# Patient Record
Sex: Female | Born: 1995
Health system: Southern US, Community
[De-identification: ages and names within clinical notes are randomized; demographics above are authoritative.]

## PROBLEM LIST (undated history)

## (undated) ENCOUNTER — Inpatient Hospital Stay (HOSPITAL_COMMUNITY): Payer: Self-pay

## (undated) DIAGNOSIS — J45909 Unspecified asthma, uncomplicated: Secondary | ICD-10-CM

## (undated) DIAGNOSIS — R87629 Unspecified abnormal cytological findings in specimens from vagina: Secondary | ICD-10-CM

## (undated) DIAGNOSIS — D649 Anemia, unspecified: Secondary | ICD-10-CM

## (undated) DIAGNOSIS — F32A Depression, unspecified: Secondary | ICD-10-CM

## (undated) DIAGNOSIS — N809 Endometriosis, unspecified: Secondary | ICD-10-CM

## (undated) DIAGNOSIS — F329 Major depressive disorder, single episode, unspecified: Secondary | ICD-10-CM

## (undated) DIAGNOSIS — Z9889 Other specified postprocedural states: Secondary | ICD-10-CM

## (undated) DIAGNOSIS — J189 Pneumonia, unspecified organism: Secondary | ICD-10-CM

## (undated) DIAGNOSIS — M797 Fibromyalgia: Secondary | ICD-10-CM

## (undated) DIAGNOSIS — E282 Polycystic ovarian syndrome: Secondary | ICD-10-CM

## (undated) DIAGNOSIS — M549 Dorsalgia, unspecified: Secondary | ICD-10-CM

## (undated) DIAGNOSIS — F419 Anxiety disorder, unspecified: Secondary | ICD-10-CM

## (undated) DIAGNOSIS — R51 Headache: Secondary | ICD-10-CM

## (undated) DIAGNOSIS — K219 Gastro-esophageal reflux disease without esophagitis: Secondary | ICD-10-CM

## (undated) DIAGNOSIS — M419 Scoliosis, unspecified: Secondary | ICD-10-CM

## (undated) DIAGNOSIS — F319 Bipolar disorder, unspecified: Secondary | ICD-10-CM

## (undated) DIAGNOSIS — S82899A Other fracture of unspecified lower leg, initial encounter for closed fracture: Secondary | ICD-10-CM

## (undated) DIAGNOSIS — N83209 Unspecified ovarian cyst, unspecified side: Secondary | ICD-10-CM

## (undated) DIAGNOSIS — K589 Irritable bowel syndrome without diarrhea: Secondary | ICD-10-CM

## (undated) DIAGNOSIS — R112 Nausea with vomiting, unspecified: Secondary | ICD-10-CM

## (undated) DIAGNOSIS — R519 Headache, unspecified: Secondary | ICD-10-CM

## (undated) DIAGNOSIS — E785 Hyperlipidemia, unspecified: Secondary | ICD-10-CM

## (undated) HISTORY — PX: ABDOMINAL HYSTERECTOMY: SHX81

## (undated) HISTORY — DX: Anxiety disorder, unspecified: F41.9

## (undated) HISTORY — DX: Depression, unspecified: F32.A

## (undated) HISTORY — PX: FRACTURE SURGERY: SHX138

## (undated) HISTORY — DX: Hyperlipidemia, unspecified: E78.5

## (undated) HISTORY — DX: Major depressive disorder, single episode, unspecified: F32.9

## (undated) HISTORY — DX: Unspecified ovarian cyst, unspecified side: N83.209

## (undated) HISTORY — DX: Fibromyalgia: M79.7

## (undated) HISTORY — DX: Dorsalgia, unspecified: M54.9

---
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
## 2013-08-21 ENCOUNTER — Inpatient Hospital Stay: Payer: Self-pay | Admitting: Psychiatry

## 2013-08-21 LAB — DRUG SCREEN, URINE
Benzodiazepine, Ur Scrn: NEGATIVE (ref ?–200)
Cannabinoid 50 Ng, Ur ~~LOC~~: NEGATIVE (ref ?–50)
Cocaine Metabolite,Ur ~~LOC~~: NEGATIVE (ref ?–300)
MDMA (Ecstasy)Ur Screen: NEGATIVE (ref ?–500)
Methadone, Ur Screen: NEGATIVE (ref ?–300)
Opiate, Ur Screen: NEGATIVE (ref ?–300)
Phencyclidine (PCP) Ur S: NEGATIVE (ref ?–25)
Tricyclic, Ur Screen: NEGATIVE (ref ?–1000)

## 2013-08-21 LAB — CBC
HCT: 37.1 % (ref 35.0–47.0)
HGB: 12.5 g/dL (ref 12.0–16.0)
MCHC: 33.6 g/dL (ref 32.0–36.0)
MCV: 78 fL — ABNORMAL LOW (ref 80–100)
Platelet: 409 10*3/uL (ref 150–440)
RBC: 4.74 10*6/uL (ref 3.80–5.20)
RDW: 14.4 % (ref 11.5–14.5)
WBC: 9.9 10*3/uL (ref 3.6–11.0)

## 2013-08-21 LAB — URINALYSIS, COMPLETE
Bacteria: NONE SEEN
Glucose,UR: NEGATIVE mg/dL (ref 0–75)
Ketone: NEGATIVE
Leukocyte Esterase: NEGATIVE
Nitrite: NEGATIVE
Squamous Epithelial: 2

## 2013-08-21 LAB — COMPREHENSIVE METABOLIC PANEL
Anion Gap: 3 — ABNORMAL LOW (ref 7–16)
Bilirubin,Total: 0.3 mg/dL (ref 0.2–1.0)
Calcium, Total: 8.7 mg/dL — ABNORMAL LOW (ref 9.0–10.7)
Co2: 28 mmol/L — ABNORMAL HIGH (ref 16–25)
Creatinine: 0.59 mg/dL — ABNORMAL LOW (ref 0.60–1.30)
Osmolality: 270 (ref 275–301)
Potassium: 3.6 mmol/L (ref 3.3–4.7)
SGOT(AST): 24 U/L (ref 0–26)
Sodium: 136 mmol/L (ref 132–141)
Total Protein: 7.7 g/dL (ref 6.4–8.6)

## 2013-08-21 LAB — TSH: Thyroid Stimulating Horm: 1.35 u[IU]/mL

## 2013-08-26 LAB — HEPATIC FUNCTION PANEL A (ARMC)
Alkaline Phosphatase: 102 U/L (ref 82–169)
Total Protein: 7.5 g/dL (ref 6.4–8.6)

## 2013-12-30 ENCOUNTER — Emergency Department: Payer: Self-pay | Admitting: Emergency Medicine

## 2014-01-23 ENCOUNTER — Ambulatory Visit: Payer: Self-pay | Admitting: Specialist

## 2014-04-04 ENCOUNTER — Emergency Department: Payer: Self-pay | Admitting: Emergency Medicine

## 2014-07-11 ENCOUNTER — Other Ambulatory Visit: Payer: Self-pay | Admitting: Family Medicine

## 2014-07-11 LAB — BASIC METABOLIC PANEL
Anion Gap: 7 (ref 7–16)
BUN: 10 mg/dL (ref 7–18)
Calcium, Total: 9.2 mg/dL (ref 9.0–10.7)
Chloride: 102 mmol/L (ref 98–107)
Co2: 29 mmol/L (ref 21–32)
Creatinine: 0.73 mg/dL (ref 0.60–1.30)
EGFR (African American): >60
EGFR (Non-African Amer.): >60
Glucose: 89 mg/dL (ref 65–99)
Osmolality: 274 (ref 275–301)
Potassium: 3.8 mmol/L (ref 3.5–5.1)
Sodium: 138 mmol/L (ref 136–145)

## 2014-12-29 ENCOUNTER — Emergency Department: Payer: Self-pay | Admitting: Emergency Medicine

## 2015-02-08 NOTE — Consult Note (Signed)
  Psychological Assessment  [name]18of Evaluation: 11-4-14Administered: [facility] Multiphasic Personality Inventory-2 (MMPI-2) for Referral: [name] was referred for a psychological assessment by her physician, Kristine LineaJolanta Pucilowska, MD. She was admitted to Behavioral Medicine for the treatment of increasing depression and thoughts to cut herself. She has a history of depression, anxiety and self-cutting. Please see the history and physical and psychosocial history for further background information. An assessment of personality structure was requested. [name]? MMPI-2 protocol is compared to that of other adult females she obtained the following profile: 27?63+[id number]:#. The MMPI-2 validity scales indicate that the clinical profile is valid. It also suggests that she is experiencing a long standing and serious disturbance. PresentationShe reports that she is experiencing a moderate level of emotional distress characterized by depression and anxiety. She feels unable to get going and may have a low level of anhedonia. She is more sensitive and feels more intensely than most people. She worries about many aspects of her life. Her feelings are easily hurt and she is inclined to take things hard. She often is sorry she is so irritable and grouchy.   She reports that her attention and concentration are all right. She lacks self-confidence and thinks she is useless at times. She has sometimes thought that difficulties were piling up so high that she could not overcome them. At periods her mind seems to work more slowly than usual. Her thought processes are as expected, with no unusual ideas or experiences.  She wishes that she could be as happy as others seem to be. Relations:  She reports that she is socially reserved. She likes making decisions and assigning jobs to others and believes, if given the chance, she would make a good leader of people. She finds it hard to talk when she meets new people.  She reports good relations with her family. Her family demonstrated love and companionship toward her. Problem Areas: She reports that she is in just as good physical health as most of her friends. Her sleep is not fitful and disturbed. She does not tire quickly and is not without energy a good deal of the time. She is very conventional and does not get into trouble over her behavior. Treatment: Her prognosis is fair to good because she is experiencing some distress which can motivate her to engage in therapy. Short-term, behavioral interventions focused on her reasons for entering treatment will be important initially. If she becomes ngaged in the treatment process, cognitive-behavioral therapy focusing on her depressive cognitions will be beneficial. Diagnostic Impression:Depressive DisorderAnxiety Disorder with history of panic attacksBipolar Disorder    Electronic Signatures: Carola Frostoush, Lee Ann (PsyD, HSP-P)  (Signed on 04-Nov-14 13:44)  Authored  Last Updated: 04-Nov-14 13:44 by Carola Frostoush, Lee Ann (PsyD, HSP-P)

## 2015-02-08 NOTE — H&P (Signed)
PATIENT NAME:  Janet Rich, Janet Rich MR#:  [id number] DATE OF BIRTH:  01-Sep-1996  DATE OF ADMISSION:  08/21/2013  REFERRING PHYSICIAN: Emergency Room MD.   ATTENDING PHYSICIAN: Kristine Linea, MD.   IDENTIFYING DATA: Janet Rich is an 19 year old female with history of depression and anxiety.   CHIEF COMPLAINT: "I wanted to cut myself."   HISTORY OF PRESENT ILLNESS: Janet Rich has a long history of depression. She started when she was still in elementary school, and between the age of 40 and 27, she was in therapy which she found rather helpful. For past year or about, she was prescribed Celexa but stopped taking it about 2 months ago. She felt that the Celexa took away any happiness from her life. She did not feel as depressed, but she never felt happy or at ease. She reports a long history of chronic suicidal thoughts. She used to cut but stopped doing it several years ago. She has not been cutting ever since and today was the first time when she was really tempted. Her mother noticed and brought her to the hospital. She attempted an overdose once a month or so ago when she overdosed on Celexa. She did not come to the hospital. Reportedly, the mother knew about it. She reports poor sleep, increased appetite with 20-pound weight gain, anhedonia, feelings of guilt, hopelessness, worthlessness, poor energy and concentration, social isolation, crying spells that have now culminated in suicidal thoughts with a plan. The patient does report symptoms suggestive of bipolar mania, but they are very short lasting, hours or a day at the most. She denies psychotic symptoms. She does feel anxious and has panic attacks at least 2 to 3 times a week. She reports that they were not helped with Celexa. She has some symptoms suggestive of obsessive-compulsive disorder with organizing. She feels that she spends too much time on that. No checking behavior or handwashing. She does worry in her head and has to calm her head down  in order to fall to sleep. She denies alcohol, illicit drugs or prescription pill abuse.   PAST PSYCHIATRIC HISTORY: As above, she was in therapy. She still has a therapist at [facility] but no longer she can afford it. There are transportation problems and money problems.   FAMILY PSYCHIATRIC HISTORY: Grandma with depression. Grandfather with alcoholism. Mother with bipolar. Father with schizophrenia. There were no completed suicides in the family.   PAST MEDICAL HISTORY: Asthma.   ALLERGIES: No known drug allergies.   MEDICATIONS ON ADMISSION: Albuterol inhaler.   SOCIAL HISTORY: She is a Holiday representative in high school. Does well in school. She wants to study education and has college plans but recent fight with depression made her change her mind, and she wants to do some kind of zoology. Has not decided yet. She lives with her mother. Her parents are divorced. She has sporadic contact with her father. She has friends and does well in school.   REVIEW OF SYSTEMS:   CONSTITUTIONAL: No fevers or chills. Positive for 20-pound weight gain.  EYES: No double or blurred vision.  ENT: No hearing loss.  RESPIRATORY: Positive for occasional shortness of breath.  CARDIOVASCULAR: No chest pain or orthopnea.  GASTROINTESTINAL: No abdominal pain, nausea, vomiting or diarrhea.  GENITOURINARY: No incontinence or frequency.  ENDOCRINE: No heat or cold intolerance.  LYMPHATIC: No anemia or easy bruising.  INTEGUMENTARY: No acne or rash.  MUSCULOSKELETAL: No muscle or joint pain.  NEUROLOGIC: No tingling or weakness.  PSYCHIATRIC: See history of present  illness for details.   PHYSICAL EXAMINATION:  VITAL SIGNS: Blood pressure 135/76, pulse 93, respirations 18, temperature 98.2.  GENERAL: This is an obese young female in no acute distress.  HEENT: The pupils are equal, round and reactive to light. Sclerae anicteric.  NECK: Supple. No thyromegaly.  LUNGS: Clear to auscultation. No dullness to percussion.   HEART: Regular rhythm and rate. No murmurs, rubs or gallops.  ABDOMEN: Soft, nontender, nondistended. Positive bowel sounds.  MUSCULOSKELETAL: Normal muscle strength in all extremities.  SKIN: No rashes or bruises.  LYMPHATIC: No cervical adenopathy.  NEUROLOGIC: Cranial nerves II through XII are intact.   LABORATORY DATA: Chemistries are within normal limits. Blood alcohol level is zero. LFTs within normal limits. TSH 1.35. Urine tox screen negative for substances. CBC within normal limits. Urinalysis is not suggestive of urinary tract infection.   MENTAL STATUS EXAMINATION ON ADMISSION: The patient is alert and oriented to person, place, time and situation. She is pleasant, polite and cooperative. She is well groomed and casually dressed. She maintains good eye contact. Her speech is of normal rhythm, rate and volume. Mood is depressed with flat affect. Thought process is logical and goal oriented. Thought content: She denies suicidal or homicidal ideation at the moment but was brought to the hospital by her mother after she attempted to cut. She denies thoughts of hurting others. She denies delusions, paranoia, auditory or visual hallucinations. Her cognition is grossly intact. She registers 3 out of 3 and recalls 3 out of 3 objects after 5 minutes. She can spell world forward and backward. She knows the current Economistresident. Her insight and judgment are questionable.   SUICIDE RISK ASSESSMENT: This is a patient with history of depression, anxiety, mood instability and self-injurious behavior in the past who came to the hospital for worsening depression and suicidal ideation with a plan in the context of discontinuation of her antidepressant. She is at increased risk of suicide.   DIAGNOSES:  AXIS I: Bipolar affective disorder, depressed.  AXIS II: Deferred.  AXIS III: Asthma.  AXIS IV: Mental illness, treatment compliance, access to care, financial.  AXIS V: Global assessment of functioning 25.    PLAN: The patient [facility] Unit for safety, stabilization and medication management. She was initially placed on suicide precautions and was closely monitored for any unsafe behaviors. She underwent full psychiatric and risk assessment. She received pharmacotherapy, individual and group psychotherapy, substance abuse counseling and support from therapeutic milieu.  1. Suicidal ideation: The patient is able to contract for safety.  2. Mood: We will start Lamictal and add Seroquel at night for sleep and further mood stabilization. Will try to titrate the dose as quickly as feasible.  3. Medical: She is on albuterol.  4. Disposition: She will be discharged to home to her mother.    ____________________________ Ellin GoodieJolanta B. Jennet MaduroPucilowska, MD jbp:gb D: 08/21/2013 20:55:39 ET T: 08/21/2013 21:25:11 ET JOB#: [id number]  cc: Dymond Spreen B. Jennet MaduroPucilowska, MD, <Dictator> Shari ProwsJOLANTA B Hena Ewalt MD ELECTRONICALLY SIGNED 08/22/2013 23:07

## 2015-03-01 ENCOUNTER — Emergency Department: Payer: Medicaid Other

## 2015-03-01 ENCOUNTER — Encounter: Payer: Self-pay | Admitting: Emergency Medicine

## 2015-03-01 ENCOUNTER — Emergency Department
Admission: EM | Admit: 2015-03-01 | Discharge: 2015-03-01 | Disposition: A | Discharge status: Home / Self Care | Payer: Medicaid Other | Attending: Emergency Medicine | Admitting: Emergency Medicine

## 2015-03-01 DIAGNOSIS — Y92009 Unspecified place in unspecified non-institutional (private) residence as the place of occurrence of the external cause: Secondary | ICD-10-CM | POA: Insufficient documentation

## 2015-03-01 DIAGNOSIS — S82891A Other fracture of right lower leg, initial encounter for closed fracture: Secondary | ICD-10-CM | POA: Diagnosis not present

## 2015-03-01 DIAGNOSIS — T148 Other injury of unspecified body region: Secondary | ICD-10-CM | POA: Diagnosis not present

## 2015-03-01 DIAGNOSIS — Y9301 Activity, walking, marching and hiking: Secondary | ICD-10-CM | POA: Insufficient documentation

## 2015-03-01 DIAGNOSIS — Y9389 Activity, other specified: Secondary | ICD-10-CM | POA: Insufficient documentation

## 2015-03-01 DIAGNOSIS — T148XXA Other injury of unspecified body region, initial encounter: Secondary | ICD-10-CM

## 2015-03-01 DIAGNOSIS — S99911A Unspecified injury of right ankle, initial encounter: Secondary | ICD-10-CM | POA: Diagnosis present

## 2015-03-01 DIAGNOSIS — Y998 Other external cause status: Secondary | ICD-10-CM | POA: Insufficient documentation

## 2015-03-01 DIAGNOSIS — W108XXA Fall (on) (from) other stairs and steps, initial encounter: Secondary | ICD-10-CM | POA: Insufficient documentation

## 2015-03-01 HISTORY — DX: Unspecified asthma, uncomplicated: J45.909

## 2015-03-01 MED ORDER — TRAMADOL HCL 50 MG PO TABS: 50.0000 mg | ORAL_TABLET | Freq: Three times a day (TID) | ORAL | Status: DC | PRN

## 2015-03-01 NOTE — ED Provider Notes (Signed)
[facility] Emergency Department Provider Note ____________________________________________  Time seen: Approximately 1310 PM  I have reviewed the triage vital signs and the nursing notes.   HISTORY  Chief Complaint Ankle Pain   HPI Janet Rich is a 19 y.o. female presents to the ER with complaints of right lower extremity pain. states yesterday afternoon at home walking down steps while texting on phone. Reports not paying attention and misstepped, and rolled her right ankle. States landed directly on top of right leg. Denies head injury or loss of consciousness. Denies pain to neck, back or other injury.   Patient states pain is present from the right foot to right shin. Worse in the ankle. Unable to weight-bear. Describes pain at 6 out of 10 sharp with movement or weightbearing.   Past Medical History  Diagnosis Date  . Asthma     There are no active problems to display for this patient.   No past surgical history on file.  current outpatient prescriptions: Celexa. Mobic as needed   Allergies Review of patient's allergies indicates no known allergies.  No family history on file.  Social History History  Substance Use Topics  . Smoking status: Never Smoker   . Smokeless tobacco: Not on file  . Alcohol Use: No    Review of Systems Constitutional: No fever/chills Eyes: No visual changes. ENT: No sore throat. Cardiovascular: Denies chest pain. Respiratory: Denies shortness of breath. Gastrointestinal: No abdominal pain.  No nausea, no vomiting.  No diarrhea.  No constipation. Genitourinary: Negative for dysuria. Musculoskele positive for right leg pain. Negative for rash. Neurological: Negative for headaches, focal weakness or numbness.  10-point ROS otherwise negative.  ____________________________________________   PHYSICAL EXAM:  VITAL SIGNS: ED Triage Vitals  Enc Vitals Group     BP 03/01/15 1113 121/82 mmHg     Pulse Rate  03/01/15 1113 86     Resp 03/01/15 1113 18     Temp 03/01/15 1113 98.7 F (37.1 C)     Temp Source 03/01/15 1113 Oral     SpO2 03/01/15 1113 100 %     Weight 03/01/15 1113 175 lb (79.379 kg)     Height 03/01/15 1113 5\' 1"  (1.549 m)     Head Cir --      Peak Flow --      Pain Score 03/01/15 1111 6     Pain Loc --      Pain Edu? --      Excl. in GC? --     Constitutional: Alert and oriented. Well appearing and in no acute distress. Eyes: Conjunctivae are normal. PERRL. EOMI. Head: Atraumatic. Nose: No congestion/rhinnorhea. Mouth/Throat: Mucous membranes are moist.   Neck: No stridor.  No cervical spine tenderness to palpation. Hematological/Lymphatic/Immunilogical: No cervical lymphadenopathy. Cardiovascular: Normal rate, regular rhythm. Grossly normal heart sounds.  Good peripheral circulation. Respiratory: Normal respiratory effort.  No retractions. Lungs CTAB. Gastrointestinal: Soft and nontender. No distention. No abdominal bruits. No CVA tenderness. Musculoskeletal: Right mid tibial moderate tender palpation, no swelling, no ecchymosis and skin intact. Full ROM. No right knee tenderness.   Right medial ankle moderate to tender to palpation, right lateral ankle mild to moderate tender to palpation.Full ROM. Pain with ankle rotation.    Right first through fourth metatarsals mild to moderate tender to palpation. Mild medial right ankle swelling. No ecchymosis no erythema skin intact.Distal pedal pulses equal bilaterally and easily found.  No neck or back tenderness to palpation. Upper extremities nontender with full  range of motion. Left lower extremity nontender with full range of motion. Neurologic:  Normal speech and language. No gross focal neurologic deficits are appreciated. Speech is normal. No gait instability. Skin:  Skin is warm, dry and intact. No rash noted. Psychiatric: Mood and affect are normal. Speech and behavior are  normal.  ____________________________________________    RADIOLOGY  RIGHT TIBIA AND FIBULA - 2 VIEW  COMPARISON: Right ankle Mar 01, 2015  FINDINGS: Frontal and lateral views were obtained. There is a tiny avulsion arising from the medial malleolus, better seen on ankle images peer no other evidence of fracture. No dislocation. Joint spaces appear intact. No erosive change.  IMPRESSION: Small avulsion arising from the medial malleolus. No other fracture. No dislocation. No abnormal periosteal reaction.   Electronically Signed By: Bretta BangWilliam Woodruff III M.D. On: 03/01/2015 13:08  RIGHT FOOT COMPLETE - 3+ VIEW  COMPARISON: None.  FINDINGS: There is no evidence of fracture or dislocation. There is no evidence of arthropathy or other focal bone abnormality. Soft tissues are unremarkable.  IMPRESSION: Negative.   Electronically Signed By: Amie Portlandavid Ormond M.D. On: 03/01/2015 13:07 RIGHT ANKLE - COMPLETE 3+ VIEW  COMPARISON: None.  FINDINGS: There is a small fracture along the inferior tip of the medial malleolus, nondisplaced and non comminuted, that appears acute, but could be chronic.  No other evidence of a fracture. Ankle mortise is normally spaced and aligned. There is soft tissue swelling that predominates laterally.  IMPRESSION: Small fracture along the inferior tip of the medial malleolus. No other evidence of a fracture. No dislocation.   Electronically Signed By: Amie Portlandavid Ormond M.D. On: 03/01/2015 12:23 ____________________________________________   PROCEDURES  Procedure(s) performed:  SPLINT APPLICATION Date/Time: 2:31 PM Authorized by: Renford DillsLindsey Lyrik Dockstader Consent: Verbal consent obtained. Risks and benefits: risks, benefits and alternatives were discussed Consent given by: patient Splint applied by: edtechnician Location details: right ankle  Splint type: stirrup   Supplies used: ocl Post-procedure: The splinted body part was  neurovascularly unchanged following the procedure. Patient tolerance: Patient tolerated the procedure well with no immediate complications.  crutches  ____________________________________________   INITIAL IMPRESSION / ASSESSMENT AND PLAN / ED COURSE  Pertinent labs & imaging results that were available during my care of the patient were reviewed by me and considered in my medical decision making (see chart for details).  Very well-appearing no acute distress. Patient presents to the ER with right ankle and right lower leg pain status post fall. X-ray positive for small avulsion fracture medial malleolus. Crutches splint, ice, and elevation. Patient follow-up with orthopedic next week. Patient agreed to plan ____________________________________________   FINAL CLINICAL IMPRESSION(S) / ED DIAGNOSES  Final diagnoses:  Ankle fracture, right  Contusion      Renford DillsLindsey Ryoma Nofziger, NP 03/01/15 1433  Minna AntisKevin Paduchowski, MD 03/03/15 1215

## 2015-03-01 NOTE — ED Notes (Signed)
C/o right ankle pain, states she injured right ankle yesterday while walking down some stairs

## 2015-03-01 NOTE — Discharge Instructions (Signed)
Take medications as prescribed. Keep right ankle in splint and use crutches. Apply ice and elevate. Take over-the-counter ibuprofen as needed.  Follow-up with orthopedic next week as discussed. See above to call today to schedule follow-up appointment.  Return to ER for new or worsening concerns  Ankle Fracture A fracture is a break in a bone. The ankle joint is made up of three bones. These include the lower (distal)sections of your lower leg bones, called the tibia and fibula, along with a bone in your foot, called the talus. Depending on how bad the break is and if more than one ankle joint bone is broken, a cast or splint is used to protect and keep your injured bone from moving while it heals. Sometimes, surgery is required to help the fracture heal properly.  There are two general types of fractures:  Stable fracture. This includes a single fracture line through one bone, with no injury to ankle ligaments. A fracture of the talus that does not have any displacement (movement of the bone on either side of the fracture line) is also stable.  Unstable fracture. This includes more than one fracture line through one or more bones in the ankle joint. It also includes fractures that have displacement of the bone on either side of the fracture line. CAUSES  A direct blow to the ankle.   Quickly and severely twisting your ankle.  Trauma, such as a car accident or falling from a significant height. RISK FACTORS You may be at a higher risk of ankle fracture if:  You have certain medical conditions.  You are involved in high-impact sports.  You are involved in a high-impact car accident. SIGNS AND SYMPTOMS   Tender and swollen ankle.  Bruising around the injured ankle.  Pain on movement of the ankle.  Difficulty walking or putting weight on the ankle.  A cold foot below the site of the ankle injury. This can occur if the blood vessels passing through your injured ankle were also  damaged.  Numbness in the foot below the site of the ankle injury. DIAGNOSIS  An ankle fracture is usually diagnosed with a physical exam and X-rays. A CT scan may also be required for complex fractures. TREATMENT  Stable fractures are treated with a cast or splint and using crutches to avoid putting weight on your injured ankle. This is followed by an ankle strengthening program. Some patients require a special type of cast, depending on other medical problems they may have. Unstable fractures require surgery to ensure the bones heal properly. Your health care provider will tell you what type of fracture you have and the best treatment for your condition. HOME CARE INSTRUCTIONS   Review correct crutch use with your health care provider and use your crutches as directed. Safe use of crutches is extremely important. Misuse of crutches can cause you to fall or cause injury to nerves in your hands or armpits.  Do not put weight or pressure on the injured ankle until directed by your health care provider.  To lessen the swelling, keep the injured leg elevated while sitting or lying down.  Apply ice to the injured area:  Put ice in a plastic bag.  Place a towel between your cast and the bag.  Leave the ice on for 20 minutes, 2-3 times a day.  If you have a plaster or fiberglass cast:  Do not try to scratch the skin under the cast with any objects. This can increase your risk  of skin infection.  Check the skin around the cast every day. You may put lotion on any red or sore areas.  Keep your cast dry and clean.  If you have a plaster splint:  Wear the splint as directed.  You may loosen the elastic around the splint if your toes become numb, tingle, or turn cold or blue.  Do not put pressure on any part of your cast or splint; it may break. Rest your cast only on a pillow the first 24 hours until it is fully hardened.  Your cast or splint can be protected during bathing with a  plastic bag sealed to your skin with medical tape. Do not lower the cast or splint into water.  Take medicines as directed by your health care provider. Only take over-the-counter or prescription medicines for pain, discomfort, or fever as directed by your health care provider.  Do not drive a vehicle until your health care provider specifically tells you it is safe to do so.  If your health care provider has given you a follow-up appointment, it is very important to keep that appointment. Not keeping the appointment could result in a chronic or permanent injury, pain, and disability. If you have any problem keeping the appointment, call the facility for assistance. SEEK MEDICAL CARE IF: You develop increased swelling or discomfort. SEEK IMMEDIATE MEDICAL CARE IF:   Your cast gets damaged or breaks.  You have continued severe pain.  You develop new pain or swelling after the cast was put on.  Your skin or toenails below the injury turn blue or gray.  Your skin or toenails below the injury feel cold, numb, or have loss of sensitivity to touch.  There is a bad smell or pus draining from under the cast. MAKE SURE YOU:   Understand these instructions.  Will watch your condition.  Will get help right away if you are not doing well or get worse. Document Released: 10/02/2000 Document Revised: 10/10/2013 Document Reviewed: 05/04/2013 [facility] Patient Information 2015 [employer] [place]. This information is not intended to replace advice given to you by your health care provider. Make sure you discuss any questions you have with your health care provider.  Contusion A contusion is a deep bruise. Contusions happen when an injury causes bleeding under the skin. Signs of bruising include pain, puffiness (swelling), and discolored skin. The contusion may turn blue, purple, or yellow. HOME CARE   Put ice on the injured area.  Put ice in a plastic bag.  Place a towel between your skin and the  bag.  Leave the ice on for 15-20 minutes, 03-04 times a day.  Only take medicine as told by your doctor.  Rest the injured area.  If possible, raise (elevate) the injured area to lessen puffiness. GET HELP RIGHT AWAY IF:   You have more bruising or puffiness.  You have pain that is getting worse.  Your puffiness or pain is not helped by medicine. MAKE SURE YOU:   Understand these instructions.  Will watch your condition.  Will get help right away if you are not doing well or get worse. Document Released: 03/23/2008 Document Revised: 12/28/2011 Document Reviewed: 08/10/2011 [facility] Patient Information 2015 [place], [place]. This information is not intended to replace advice given to you by your health care provider. Make sure you discuss any questions you have with your health care provider.

## 2015-05-01 ENCOUNTER — Encounter: Payer: Self-pay | Admitting: Obstetrics and Gynecology

## 2015-05-01 ENCOUNTER — Ambulatory Visit (INDEPENDENT_AMBULATORY_CARE_PROVIDER_SITE_OTHER): Payer: 59 | Admitting: Obstetrics and Gynecology

## 2015-05-01 VITALS — BP 112/73 | HR 80 | Ht 61.0 in | Wt 176.2 lb

## 2015-05-01 DIAGNOSIS — R102 Pelvic and perineal pain: Secondary | ICD-10-CM

## 2015-05-01 DIAGNOSIS — N946 Dysmenorrhea, unspecified: Secondary | ICD-10-CM | POA: Insufficient documentation

## 2015-05-01 DIAGNOSIS — Z842 Family history of other diseases of the genitourinary system: Secondary | ICD-10-CM

## 2015-05-01 DIAGNOSIS — Z8669 Personal history of other diseases of the nervous system and sense organs: Secondary | ICD-10-CM

## 2015-05-01 DIAGNOSIS — R638 Other symptoms and signs concerning food and fluid intake: Secondary | ICD-10-CM | POA: Diagnosis not present

## 2015-05-01 DIAGNOSIS — F418 Other specified anxiety disorders: Secondary | ICD-10-CM | POA: Diagnosis not present

## 2015-05-01 DIAGNOSIS — F329 Major depressive disorder, single episode, unspecified: Secondary | ICD-10-CM | POA: Insufficient documentation

## 2015-05-01 DIAGNOSIS — F419 Anxiety disorder, unspecified: Secondary | ICD-10-CM | POA: Insufficient documentation

## 2015-05-01 DIAGNOSIS — F32A Depression, unspecified: Secondary | ICD-10-CM | POA: Insufficient documentation

## 2015-05-01 LAB — POCT URINALYSIS DIPSTICK
BILIRUBIN UA: NEGATIVE
Glucose, UA: NEGATIVE
Ketones, UA: NEGATIVE
LEUKOCYTES UA: NEGATIVE
NITRITE UA: NEGATIVE
PROTEIN UA: NEGATIVE
RBC UA: NEGATIVE
Spec Grav, UA: 1.015
UROBILINOGEN UA: 0.2
pH, UA: 6

## 2015-05-01 NOTE — Addendum Note (Signed)
Addended by: Marchelle FolksMILLER, Jadin Kagel G on: 05/01/2015 04:16 PM   Modules accepted: Orders

## 2015-05-01 NOTE — Patient Instructions (Signed)
Start Lo Loestrin OCPS. Stop Minipill Return in 2 months

## 2015-05-01 NOTE — Progress Notes (Signed)
[name], female   DOB: 1995/12/31, 19 y.o.   MRN: [id number] Pt c/o of abd/back pain x several months Pain is associated with cycle btb on ocp   NEW PATIENT EVALUATION  Chief complaint: 1.  Dysmenorrhea. 2.  Abnormal uterine bleeding on progestin only pill. 3.  Family history of endometriosis. 4.  History of migraines. 5.  Anxiety.  The patient is a 19 year old single white female, para 0, on norethindrone oral contraceptive for attempted cycle regulation and control of dysmenorrhea 4 months, presents for evaluation of the above noted complaints.  Past GYN history: Menarche age 19. Intervals.  Every 2 to every 4 weeks. Duration: 7 days, heavy. Dysmenorrhea: Severe; patient has missed school because of pain in the past; patient takes Pamprin for discomfort. History of deep thrusting dyspareunia. No history of STI. Mom with history of endometriosis status post hysterectomy in her 6920s.  Dysmenorrhea is characterized as central cramping and low backache which does not radiate into the buttocks or thighs.  Past Medical History  Diagnosis Date  . Asthma   . Anxiety   . Depression   . Back pain    Past Surgical History  Procedure Laterality Date  . Wisdom tooth extraction  2013   The patient has a family history of Endometriosis. No family history of colon cancer, ovary cancer, or breast cancer.  Social history: Nonsmoker Nondrinker. No history of drug use  Physical exam: Pleasant, well-appearing white female in no acute distress. Neck supple without thyromegaly or adenopathy. Abdomen soft, nontender, no organomegaly. Pelvic exam: External genitalia-normal BUS-normal Vagina-normal Cervix-normal; mild cervical motion tenderness. Uterus-midplane to retroverted; tender.  1/4; mobile. Adnexa-nonpalpable, nontender. Rectal-external exam normal; internal exam declined. Extremities-normal.  IMPRESSION: 1. Dysmenorrhea. 2. Possible endometriosis by  history and physical 3. Abnormal uterine bleeding on minipill. 4. Family history of endometriosis in mom. 5.History of migraine headaches, possibly exacerbated by estrogen.  PLAN: 1.  Stop minipill. 2.  Begin low Loestrin FE. 3.  Return in 2 months for follow-up. 4.  Patient was counseled on therapies for suspected endometriosis.   .Marland Kitchen

## 2015-05-21 ENCOUNTER — Telehealth: Payer: Self-pay | Admitting: Obstetrics and Gynecology

## 2015-05-21 NOTE — Telephone Encounter (Signed)
Pt was seen 7/13 here and went to pharmacy to pick up medication and it wasn't there. Janet Rich

## 2015-05-21 NOTE — Telephone Encounter (Signed)
PT WAS HERE ON 7/13 AND SHE WAS TOLD THAT DR DE WOULD ESCRIBE HER BIRTHCONTROL TO HER PHARMACY AND SHE WENT TO PICK IT UP AND THE ONLY THING AT THE PHARMACY THAT THEY HAD WAS SOMETHING THAT STARTED WITH AN N, AND SHE DIDN'T KNOW IF THAT WAS CORRECT, IT WAS SUPPOSE TO BE A HIRE DOSE THEN WHAT SHE WAS ON. PT STATED SHE WOULD LIKE A CALL BACK.

## 2015-05-22 MED ORDER — NORETHIN-ETH ESTRAD-FE BIPHAS 1 MG-10 MCG / 10 MCG PO TABS: 1.0000 | ORAL_TABLET | Freq: Every day | ORAL | Status: DC

## 2015-05-22 NOTE — Telephone Encounter (Signed)
Left message that generic for LO Loestrin has been sent in. If any problems please call.

## 2015-05-22 NOTE — Telephone Encounter (Signed)
Repeat message

## 2015-07-02 ENCOUNTER — Ambulatory Visit (INDEPENDENT_AMBULATORY_CARE_PROVIDER_SITE_OTHER): Payer: 59 | Admitting: Obstetrics and Gynecology

## 2015-07-02 ENCOUNTER — Encounter: Payer: Self-pay | Admitting: Obstetrics and Gynecology

## 2015-07-02 VITALS — BP 117/76 | HR 75 | Ht 61.0 in | Wt 175.6 lb

## 2015-07-02 DIAGNOSIS — Z842 Family history of other diseases of the genitourinary system: Secondary | ICD-10-CM

## 2015-07-02 DIAGNOSIS — R102 Pelvic and perineal pain: Secondary | ICD-10-CM

## 2015-07-02 DIAGNOSIS — N946 Dysmenorrhea, unspecified: Secondary | ICD-10-CM

## 2015-07-02 MED ORDER — NORGESTIMATE-ETH ESTRADIOL 0.25-35 MG-MCG PO TABS: 1.0000 | ORAL_TABLET | Freq: Every day | ORAL | Status: DC

## 2015-07-02 NOTE — Patient Instructions (Signed)
1.  Take the following medications as needed for painful periods: Advil 800 mg 3 times a day (4 200 mg tablets 3 times a day), and extra strength Tylenol 2 tablets every 6 hours. 2.  Start Sprintec oral contraceptive. 3.  Return in 3 months for follow-up.

## 2015-07-02 NOTE — Progress Notes (Signed)
[name], female   DOB: 1996/09/17, 19 y.o.   MRN: [id number] 2 month f/u  Did not take loloestin d/t to cost- 70.00 Still taking mini pill  Chief complaint: 1.  Chronic pelvic pain. 2.  Dysmenorrhea. 3.  Suspected endometriosis.  Patient presents today for follow-up on probable endometriosis.  She is currently taking the mini pill for attempt at regulation of pelvic pain and bleeding. At last visit.  Patient was to go on Lo Loestrin oral contraceptive for attempted management of her dysmenorrhea and pelvic pain; she did not start that medication due to cost constraints. Patient is willing to go on a trial of another oral contraceptive if possible, to try and help alleviate her symptoms.   IMPRESSION: 1.  Chronic pelvic pain. 2.  Dysmenorrhea. 3.  Suspected endometriosis. 4.  Unable to use lo Loestrin oral contraceptive due to cost  PLAN: 1.  Pharmacy plans reviewed.  Patient to go on Sprintec (25 g pill) at a cost of $9 per month at [facility]. 2.  Return in 3 months for reassessment. 3.  Discontinue norethindrone. 4.  Advil 800 mg 3 times a day for pain and extra strength Tylenol 1 g 4 times a day for pain.  A total of 15 minutes were spent face-to-face with the patient during this encounter and over half of that time dealt with counseling and coordination of care.  Herold Harms, MD

## 2015-07-16 ENCOUNTER — Emergency Department
Admission: EM | Admit: 2015-07-16 | Discharge: 2015-07-16 | Disposition: A | Discharge status: Home / Self Care | Payer: 59 | Attending: Emergency Medicine | Admitting: Emergency Medicine

## 2015-07-16 ENCOUNTER — Encounter: Payer: Self-pay | Admitting: Emergency Medicine

## 2015-07-16 ENCOUNTER — Emergency Department: Payer: 59

## 2015-07-16 DIAGNOSIS — Z3202 Encounter for pregnancy test, result negative: Secondary | ICD-10-CM | POA: Insufficient documentation

## 2015-07-16 DIAGNOSIS — N8320 Unspecified ovarian cysts: Secondary | ICD-10-CM | POA: Insufficient documentation

## 2015-07-16 DIAGNOSIS — N83201 Unspecified ovarian cyst, right side: Secondary | ICD-10-CM

## 2015-07-16 DIAGNOSIS — R112 Nausea with vomiting, unspecified: Secondary | ICD-10-CM | POA: Insufficient documentation

## 2015-07-16 DIAGNOSIS — Z91040 Latex allergy status: Secondary | ICD-10-CM | POA: Diagnosis not present

## 2015-07-16 LAB — COMPREHENSIVE METABOLIC PANEL
ALBUMIN: 4.3 g/dL (ref 3.5–5.0)
ALT: 15 U/L (ref 14–54)
AST: 17 U/L (ref 15–41)
Alkaline Phosphatase: 77 U/L (ref 38–126)
Anion gap: 7 (ref 5–15)
BUN: 10 mg/dL (ref 6–20)
CO2: 26 mmol/L (ref 22–32)
Calcium: 9.5 mg/dL (ref 8.9–10.3)
Chloride: 104 mmol/L (ref 101–111)
Creatinine, Ser: 0.6 mg/dL (ref 0.44–1.00)
GFR calc Af Amer: >60 mL/min (ref >60)
GLUCOSE: 95 mg/dL (ref 65–99)
POTASSIUM: 3.7 mmol/L (ref 3.5–5.1)
Sodium: 137 mmol/L (ref 135–145)
Total Bilirubin: 0.6 mg/dL (ref 0.3–1.2)
Total Protein: 8 g/dL (ref 6.5–8.1)

## 2015-07-16 LAB — CBC
HEMATOCRIT: 37.9 % (ref 35.0–47.0)
Hemoglobin: 12.2 g/dL (ref 12.0–16.0)
MCH: 24.5 pg — ABNORMAL LOW (ref 26.0–34.0)
MCHC: 32.1 g/dL (ref 32.0–36.0)
MCV: 76.3 fL — AB (ref 80.0–100.0)
Platelets: 420 10*3/uL (ref 150–440)
RBC: 4.97 MIL/uL (ref 3.80–5.20)
RDW: 16.1 % — AB (ref 11.5–14.5)
WBC: 10.4 10*3/uL (ref 3.6–11.0)

## 2015-07-16 LAB — URINALYSIS COMPLETE WITH MICROSCOPIC (ARMC ONLY)
Bilirubin Urine: NEGATIVE
Glucose, UA: NEGATIVE mg/dL
LEUKOCYTES UA: NEGATIVE
Nitrite: NEGATIVE
PH: 6 (ref 5.0–8.0)
PROTEIN: NEGATIVE mg/dL
Specific Gravity, Urine: 1.018 (ref 1.005–1.030)

## 2015-07-16 LAB — POCT PREGNANCY, URINE: Preg Test, Ur: NEGATIVE

## 2015-07-16 LAB — LIPASE, BLOOD: LIPASE: 24 U/L (ref 22–51)

## 2015-07-16 LAB — HCG, QUANTITATIVE, PREGNANCY

## 2015-07-16 MED ORDER — ONDANSETRON HCL 4 MG/2ML IJ SOLN
4.0000 mg | Freq: Once | INTRAMUSCULAR | Status: AC
  Administered 2015-07-16: 4 mg via INTRAVENOUS
  Filled 2015-07-16: qty 2

## 2015-07-16 MED ORDER — ONDANSETRON HCL 4 MG PO TABS: 4.0000 mg | ORAL_TABLET | Freq: Every day | ORAL | Status: DC | PRN

## 2015-07-16 MED ORDER — RANITIDINE HCL 150 MG PO TABS
150.0000 mg | ORAL_TABLET | Freq: Two times a day (BID) | ORAL | Status: DC
Start: 2015-07-16 — End: 2015-10-01

## 2015-07-16 MED ORDER — MORPHINE SULFATE (PF) 4 MG/ML IV SOLN
4.0000 mg | Freq: Once | INTRAVENOUS | Status: AC
  Administered 2015-07-16: 4 mg via INTRAVENOUS
  Filled 2015-07-16 (×2): qty 1

## 2015-07-16 MED ORDER — DICYCLOMINE HCL 20 MG PO TABS: 20.0000 mg | ORAL_TABLET | Freq: Three times a day (TID) | ORAL | Status: DC | PRN

## 2015-07-16 MED ORDER — SODIUM CHLORIDE 0.9 % IV SOLN
Freq: Once | INTRAVENOUS | Status: AC
  Administered 2015-07-16: 08:00:00 via INTRAVENOUS

## 2015-07-16 MED ORDER — IOHEXOL 300 MG/ML  SOLN
100.0000 mL | Freq: Once | INTRAMUSCULAR | Status: AC | PRN
  Administered 2015-07-16: 100 mL via INTRAVENOUS

## 2015-07-16 NOTE — ED Notes (Signed)
POC urine preg NEGATIVE. 

## 2015-07-16 NOTE — ED Provider Notes (Signed)
[facility] Emergency Department Provider Note     Time seen: ----------------------------------------- 6:54 AM on 07/16/2015 -----------------------------------------    I have reviewed the triage vital signs and the nursing notes.   HISTORY  Chief Complaint Emesis    HPI Janet Rich is a 19 y.o. female who presents ER with vomiting for 3 days and abdominal cramping. Patient has not had a history of this before, does not have any fevers, chills, shortness of breath, diarrhea. She has not been able to keep anything down for the last several days. Currently she feels weak   Past Medical History  Diagnosis Date  . Asthma   . Anxiety   . Depression   . Back pain     Patient Active Problem List   Diagnosis Date Noted  . Dysmenorrhea 05/01/2015  . History of migraine headaches 05/01/2015  . Anxiety and depression 05/01/2015  . Increased BMI (body mass index) 05/01/2015  . Family history of endometriosis in first degree relative 05/01/2015    Past Surgical History  Procedure Laterality Date  . Wisdom tooth extraction  2013    Allergies Latex  Social History Social History  Substance Use Topics  . Smoking status: Never Smoker   . Smokeless tobacco: None  . Alcohol Use: No    Review of Systems Constitutional: Negative for fever. Eyes: Negative for visual changes. ENT: Negative for sore throat. Cardiovascular: Negative for chest pain. Respiratory: Negative for shortness of breath. Gastrointestinal: Positive for abdominal pain and vomiting Genitourinary: Negative for dysuria. Musculoskeletal: Negative for back pain. Skin: Negative for rash. Neurological: Negative for headaches, focal weakness or numbness.  10-point ROS otherwise negative.  ____________________________________________   PHYSICAL EXAM:  VITAL SIGNS: ED Triage Vitals  Enc Vitals Group     BP 07/16/15 0254 132/76 mmHg     Pulse Rate 07/16/15 0254 79     Resp  07/16/15 0254 20     Temp 07/16/15 0254 97.9 F (36.6 C)     Temp Source 07/16/15 0254 Oral     SpO2 07/16/15 0254 100 %     Weight 07/16/15 0254 175 lb (79.379 kg)     Height 07/16/15 0254  (1.549 m)     Head Cir --      Peak Flow --      Pain Score 07/16/15 0252 6     Pain Loc --      Pain Edu? --      Excl. in GC? --     Constitutional: Alert and oriented. Well appearing and in no distress. Eyes: Conjunctivae are normal. PERRL. Normal extraocular movements. ENT   Head: Normocephalic and atraumatic.   Nose: No congestion/rhinnorhea.   Mouth/Throat: Mucous membranes are moist.   Neck: No stridor. Cardiovascular: Normal rate, regular rhythm. Normal and symmetric distal pulses are present in all extremities. No murmurs, rubs, or gallops. Respiratory: Normal respiratory effort without tachypnea nor retractions. Breath sounds are clear and equal bilaterally. No wheezes/rales/rhonchi. Gastrointestinal: Soft and nontender. No distention. No abdominal bruits.  Musculoskeletal: Nontender with normal range of motion in all extremities. No joint effusions.  No lower extremity tenderness nor edema. Neurologic:  Normal speech and language. No gross focal neurologic deficits are appreciated. Speech is normal. No gait instability. Skin:  Skin is warm, dry and intact. No rash noted. Psychiatric: Mood and affect are normal. Speech and behavior are normal. Patient exhibits appropriate insight and judgment.  ____________________________________________  ED COURSE:  Pertinent labs & imaging results that  were available during my care of the patient were reviewed by me and considered in my medical decision making (see chart for details). Patient is in no acute distress, will receive IV fluid hydration, IV Zofran and morphine. ____________________________________________    LABS (pertinent positives/negatives)  Labs Reviewed  CBC - Abnormal; Notable for the following:    MCV  76.3 (*)    MCH 24.5 (*)    RDW 16.1 (*)    All other components within normal limits  URINALYSIS COMPLETEWITH MICROSCOPIC ([facility] ONLY) - Abnormal; Notable for the following:    Color, Urine YELLOW (*)    APPearance HAZY (*)    Ketones, ur 1+ (*)    Hgb urine dipstick 1+ (*)    Bacteria, UA FEW (*)    Squamous Epithelial / LPF 0-5 (*)    All other components within normal limits  LIPASE, BLOOD  COMPREHENSIVE METABOLIC PANEL  HCG, QUANTITATIVE, PREGNANCY  POC URINE PREG, ED   IMPRESSION: 3 cm right ovarian cyst. No other abnormality seen in the abdomen or pelvis.  ____________________________________________  FINAL ASSESSMENT AND PLAN  Vomiting, ovarian cyst  Plan: Patient with labs and imaging as dictated above. I do not think the ovarian cyst is related to her symptoms today. This is 3 days of vomiting with no specific abdominal pain. CT scan is otherwise unremarkable, she's feeling better after antiemetics and pain medication. She is stable for outpatient follow-up   Emily Filbert, MD   Emily Filbert, MD 07/16/15 [phone]

## 2015-07-16 NOTE — Discharge Instructions (Signed)
Nausea and Vomiting Nausea is a sick feeling that often comes before throwing up (vomiting). Vomiting is a reflex where stomach contents come out of your mouth. Vomiting can cause severe loss of body fluids (dehydration). Children and elderly adults can become dehydrated quickly, especially if they also have diarrhea. Nausea and vomiting are symptoms of a condition or disease. It is important to find the cause of your symptoms. CAUSES   Direct irritation of the stomach lining. This irritation can result from increased acid production (gastroesophageal reflux disease), infection, food poisoning, taking certain medicines (such as nonsteroidal anti-inflammatory drugs), alcohol use, or tobacco use.  Signals from the brain.These signals could be caused by a headache, heat exposure, an inner ear disturbance, increased pressure in the brain from injury, infection, a tumor, or a concussion, pain, emotional stimulus, or metabolic problems.  An obstruction in the gastrointestinal tract (bowel obstruction).  Illnesses such as diabetes, hepatitis, gallbladder problems, appendicitis, kidney problems, cancer, sepsis, atypical symptoms of a heart attack, or eating disorders.  Medical treatments such as chemotherapy and radiation.  Receiving medicine that makes you sleep (general anesthetic) during surgery. DIAGNOSIS Your caregiver may ask for tests to be done if the problems do not improve after a few days. Tests may also be done if symptoms are severe or if the reason for the nausea and vomiting is not clear. Tests may include:  Urine tests.  Blood tests.  Stool tests.  Cultures (to look for evidence of infection).  X-rays or other imaging studies. Test results can help your caregiver make decisions about treatment or the need for additional tests. TREATMENT You need to stay well hydrated. Drink frequently but in small amounts.You may wish to drink water, sports drinks, clear broth, or eat frozen  ice pops or gelatin dessert to help stay hydrated.When you eat, eating slowly may help prevent nausea.There are also some antinausea medicines that may help prevent nausea. HOME CARE INSTRUCTIONS   Take all medicine as directed by your caregiver.  If you do not have an appetite, do not force yourself to eat. However, you must continue to drink fluids.  If you have an appetite, eat a normal diet unless your caregiver tells you differently.  Eat a variety of complex carbohydrates (rice, wheat, potatoes, bread), lean meats, yogurt, fruits, and vegetables.  Avoid high-fat foods because they are more difficult to digest.  Drink enough water and fluids to keep your urine clear or pale yellow.  If you are dehydrated, ask your caregiver for specific rehydration instructions. Signs of dehydration may include:  Severe thirst.  Dry lips and mouth.  Dizziness.  Dark urine.  Decreasing urine frequency and amount.  Confusion.  Rapid breathing or pulse. SEEK IMMEDIATE MEDICAL CARE IF:   You have blood or brown flecks (like coffee grounds) in your vomit.  You have black or bloody stools.  You have a severe headache or stiff neck.  You are confused.  You have severe abdominal pain.  You have chest pain or trouble breathing.  You do not urinate at least once every 8 hours.  You develop cold or clammy skin.  You continue to vomit for longer than 24 to 48 hours.  You have a fever. MAKE SURE YOU:   Understand these instructions.  Will watch your condition.  Will get help right away if you are not doing well or get worse. Document Released: 10/05/2005 Document Revised: 12/28/2011 Document Reviewed: 03/04/2011 [employer] Patient Information 2015 [employer]. This information is not intended   to replace advice given to you by your health care provider. Make sure you discuss any questions you have with your health care provider.  Ovarian Cyst An ovarian cyst is a fluid-filled  sac that forms on an ovary. The ovaries are small organs that produce eggs in women. Various types of cysts can form on the ovaries. Most are not cancerous. Many do not cause problems, and they often go away on their own. Some may cause symptoms and require treatment. Common types of ovarian cysts include:  Functional cysts--These cysts may occur every month during the menstrual cycle. This is normal. The cysts usually go away with the next menstrual cycle if the woman does not get pregnant. Usually, there are no symptoms with a functional cyst.  Endometrioma cysts--These cysts form from the tissue that lines the uterus. They are also called "chocolate cysts" because they become filled with blood that turns brown. This type of cyst can cause pain in the lower abdomen during intercourse and with your menstrual period.  Cystadenoma cysts--This type develops from the cells on the outside of the ovary. These cysts can get very big and cause lower abdomen pain and pain with intercourse. This type of cyst can twist on itself, cut off its blood supply, and cause severe pain. It can also easily rupture and cause a lot of pain.  Dermoid cysts--This type of cyst is sometimes found in both ovaries. These cysts may contain different kinds of body tissue, such as skin, teeth, hair, or cartilage. They usually do not cause symptoms unless they get very big.  Theca lutein cysts--These cysts occur when too much of a certain hormone (human chorionic gonadotropin) is produced and overstimulates the ovaries to produce an egg. This is most common after procedures used to assist with the conception of a baby (in vitro fertilization). CAUSES   Fertility drugs can cause a condition in which multiple large cysts are formed on the ovaries. This is called ovarian hyperstimulation syndrome.  A condition called polycystic ovary syndrome can cause hormonal imbalances that can lead to nonfunctional ovarian cysts. SIGNS AND  SYMPTOMS  Many ovarian cysts do not cause symptoms. If symptoms are present, they may include:  Pelvic pain or pressure.  Pain in the lower abdomen.  Pain during sexual intercourse.  Increasing girth (swelling) of the abdomen.  Abnormal menstrual periods.  Increasing pain with menstrual periods.  Stopping having menstrual periods without being pregnant. DIAGNOSIS  These cysts are commonly found during a routine or annual pelvic exam. Tests may be ordered to find out more about the cyst. These tests may include:  Ultrasound.  X-ray of the pelvis.  CT scan.  MRI.  Blood tests. TREATMENT  Many ovarian cysts go away on their own without treatment. Your health care provider may want to check your cyst regularly for 2-3 months to see if it changes. For women in menopause, it is particularly important to monitor a cyst closely because of the higher rate of ovarian cancer in menopausal women. When treatment is needed, it may include any of the following:  A procedure to drain the cyst (aspiration). This may be done using a long needle and ultrasound. It can also be done through a laparoscopic procedure. This involves using a thin, lighted tube with a tiny camera on the end (laparoscope) inserted through a small incision.  Surgery to remove the whole cyst. This may be done using laparoscopic surgery or an open surgery involving a larger incision in the lower  abdomen.  Hormone treatment or birth control pills. These methods are sometimes used to help dissolve a cyst. HOME CARE INSTRUCTIONS   Only take over-the-counter or prescription medicines as directed by your health care provider.  Follow up with your health care provider as directed.  Get regular pelvic exams and Pap tests. SEEK MEDICAL CARE IF:   Your periods are late, irregular, or painful, or they stop.  Your pelvic pain or abdominal pain does not go away.  Your abdomen becomes larger or swollen.  You have pressure on  your bladder or trouble emptying your bladder completely.  You have pain during sexual intercourse.  You have feelings of fullness, pressure, or discomfort in your stomach.  You lose weight for no apparent reason.  You feel generally ill.  You become constipated.  You lose your appetite.  You develop acne.  You have an increase in body and facial hair.  You are gaining weight, without changing your exercise and eating habits.  You think you are pregnant. SEEK IMMEDIATE MEDICAL CARE IF:   You have increasing abdominal pain.  You feel sick to your stomach (nauseous), and you throw up (vomit).  You develop a fever that comes on suddenly.  You have abdominal pain during a bowel movement.  Your menstrual periods become heavier than usual. MAKE SURE YOU:  Understand these instructions.  Will watch your condition.  Will get help right away if you are not doing well or get worse. Document Released: 10/05/2005 Document Revised: 10/10/2013 Document Reviewed: 06/12/2013 [facility] Patient Information 2015 [place], [place]. This information is not intended to replace advice given to you by your health care provider. Make sure you discuss any questions you have with your health care provider.

## 2015-07-16 NOTE — ED Notes (Signed)
Patient ambulatory to triage with steady gait, without difficulty or distress noted; pt reports vomiting x 3 days with abd cramping

## 2015-10-01 ENCOUNTER — Ambulatory Visit (INDEPENDENT_AMBULATORY_CARE_PROVIDER_SITE_OTHER): Payer: 59 | Admitting: Obstetrics and Gynecology

## 2015-10-01 ENCOUNTER — Encounter: Payer: Self-pay | Admitting: Obstetrics and Gynecology

## 2015-10-01 VITALS — BP 103/69 | HR 101 | Ht 61.0 in | Wt 172.1 lb

## 2015-10-01 DIAGNOSIS — N946 Dysmenorrhea, unspecified: Secondary | ICD-10-CM | POA: Diagnosis not present

## 2015-10-01 DIAGNOSIS — R102 Pelvic and perineal pain: Secondary | ICD-10-CM

## 2015-10-01 MED ORDER — NORGESTIMATE-ETH ESTRADIOL 0.25-35 MG-MCG PO TABS: 1.0000 | ORAL_TABLET | Freq: Every day | ORAL | Status: DC

## 2015-10-01 NOTE — Progress Notes (Signed)
Patient ID: Marvel Planaylor Callens, female   DOB: 1996-06-18, 19 y.o.   MRN: [id number] 3 month f/u on cpp , dysmenorrhea sprintec- helps   Chief complaint: 1.  Dysmenorrhea. 2.  Menorrhagia.  Patient presents for follow-up on dysmenorrhea and menorrhagia.  She states that over the past 2 months using Sprintec for birth control, she noted a significant decrease in her pelvic pain and dysmenorrhea.  She was unable to get her prescription for the last month and has been off of medication; this last cycle was extremely painful.  She is very much interested in continuing with the Sprintec for contraception and dysmenorrhea control.  ASSESSMENT 1.  Dysmenorrhea, controlled with OCPs.  PLAN: 1.  Continue with Sprintec oral contraceptive. 2.  Return in 6 months for follow-up/annual exam. 3.  Continue with Advil for cramps.  A total of 15 minutes were spent face-to-face with the patient during this encounter and over half of that time dealt with counseling and coordination of care.  Prentice DockerMartin A Eleri Ruben  Note: This dictation was prepared with Dragon dictation along with smaller Lobbyistphrase technology. Any transcriptional errors that result from this process are unintentional.

## 2015-10-01 NOTE — Addendum Note (Signed)
Addended by: Marchelle FolksMILLER, Covey Baller G on: 10/01/2015 04:17 PM   Modules accepted: Orders

## 2015-10-01 NOTE — Patient Instructions (Signed)
1.Continue with Sprintec oral contraceptives. 2.  Continue with Advil for cramps. 3.  Return in 6 months for Annual exam

## 2016-01-02 ENCOUNTER — Telehealth: Payer: Self-pay | Admitting: Obstetrics and Gynecology

## 2016-01-02 NOTE — Telephone Encounter (Signed)
PT CALLED AND SHE STARTED THE BC THAT DE PRESCRIBED HER PREVISEM, SHE STARTED IT LAST NIGHT AND THIS MORNING SHE IS HAVING ABDOMINAL PAIN. SHE SHE WOULD LIKE A CALL BACK TO KNOW IF SHE NEEDS TO COME IN OR WHAT

## 2016-01-02 NOTE — Telephone Encounter (Signed)
PT CALLED AND SHE STARTED HER BC LAST NIGHT THAT DR DE GAVE HER PREVISM.Marland Kitchen.Marland Kitchen.AND SHE STATED THIS MORNING SHE IS HAVING ABDOMINAL PAIN AND WANTED TO KNOW IF SHE NEEDED TO COME IN OR NOT.

## 2016-01-02 NOTE — Telephone Encounter (Signed)
Pt states since last nite she is having abd pain. Comes and goes. Lower abd. NO uti sx. BM normal. NO vomiting. Slight nausea. Pos eating/drinking. Missed her pills last month. Had previously been on sprintec x 4 months with no issues. Advised pt I did not think her pain was coming from ocp. May want to contact pharmacy to confirm same manufacturer as other pills. She may take tylenol. Try a heating pad. F/u with pcp.

## 2016-02-10 ENCOUNTER — Telehealth: Payer: Self-pay | Admitting: *Deleted

## 2016-02-10 NOTE — Telephone Encounter (Signed)
Called patient and she agreed to the 11:15 appt time tomorrow. Patient is already scheduled . Thanks

## 2016-02-10 NOTE — Telephone Encounter (Signed)
Patient called and is complaining of Bil breast pain. Patient wants to come in and see Dr. Tommi Rumpse as soon as possible. His schedule is full. Please advise where to schedule Thanks.

## 2016-02-11 ENCOUNTER — Ambulatory Visit (INDEPENDENT_AMBULATORY_CARE_PROVIDER_SITE_OTHER): Payer: 59 | Admitting: Obstetrics and Gynecology

## 2016-02-11 ENCOUNTER — Encounter: Payer: Self-pay | Admitting: Obstetrics and Gynecology

## 2016-02-11 VITALS — BP 121/75 | HR 82 | Ht 61.0 in | Wt 175.2 lb

## 2016-02-11 DIAGNOSIS — N946 Dysmenorrhea, unspecified: Secondary | ICD-10-CM | POA: Diagnosis not present

## 2016-02-11 DIAGNOSIS — N644 Mastodynia: Secondary | ICD-10-CM | POA: Diagnosis not present

## 2016-02-11 LAB — POCT URINE PREGNANCY: Preg Test, Ur: NEGATIVE

## 2016-02-11 NOTE — Patient Instructions (Signed)
1. Pregnancy test is checked today 2. Increase Advil to  600 mg 4 times a day 3. Tylenol when necessary 4. Decrease caffeine and chocolate intake

## 2016-02-11 NOTE — Progress Notes (Signed)
Chief complaint: 1.  Bilateral breast pain.  20 year old single white female, para 0, LMP 01/11/2016, on Sprintec oral contraceptives for regulation of dysmenorrhea and abnormal uterine bleeding, presents for evaluation of 1 week history of bilateral breast pain. Patient notes increased sensitivity of her breasts and nipples; breasts appears to have gotten larger recently. She does not report any significant increased caffeine intake.  Other than occasional sweet tea beverages.  She has not had any increased chocolate intake.  She has been wearing a regular broad and sports bra combines to help minimize discomfort. Is due for her menses next week.  Past medical history, past surgical history, problem list, medications, and allergies are reviewed.   OBJECTIVE: BP 121/75 mmHg  Pulse 82  Ht 5\' 1"  (1.549 m)  Wt 175 lb 3.2 oz (79.47 kg)  BMI 33.12 kg/m2  LMP 01/11/2016 (Exact Date) Pleasant white female in no acute distress.  She is alert and oriented. Neck: Supple without thyromegaly or adenopathy.  No supraclavicular adenopathy. Breasts: Bilaterally symmetric without dominant mass, adenopathy or nipple discharge. Abdomen: Soft, nontender, without organomegaly.  ASSESSMENT: 1.  Bilateral breast pain (mastodynia), unclear etiology; menses do in 1 week.  PLAN: 1.  Continue with support bra, use 2.  Advil 600 mg 4 times a day as needed. 3.  Tylenol when necessary 4.  Decrease sweet Tea intake. 5.  Monitor breast pain and if symptoms persist over the next several months.  Return for reevaluation

## 2016-04-01 ENCOUNTER — Encounter: Payer: 59 | Admitting: Obstetrics and Gynecology

## 2016-04-02 ENCOUNTER — Other Ambulatory Visit: Payer: Self-pay | Admitting: Obstetrics and Gynecology

## 2016-04-23 ENCOUNTER — Ambulatory Visit (INDEPENDENT_AMBULATORY_CARE_PROVIDER_SITE_OTHER): Payer: 59 | Admitting: Obstetrics and Gynecology

## 2016-04-23 ENCOUNTER — Encounter: Payer: Self-pay | Admitting: Obstetrics and Gynecology

## 2016-04-23 VITALS — BP 113/82 | HR 80 | Ht 61.0 in | Wt 179.9 lb

## 2016-04-23 DIAGNOSIS — N946 Dysmenorrhea, unspecified: Secondary | ICD-10-CM

## 2016-04-23 DIAGNOSIS — R638 Other symptoms and signs concerning food and fluid intake: Secondary | ICD-10-CM | POA: Diagnosis not present

## 2016-04-23 DIAGNOSIS — R102 Pelvic and perineal pain: Secondary | ICD-10-CM | POA: Diagnosis not present

## 2016-04-23 DIAGNOSIS — Z0000 Encounter for general adult medical examination without abnormal findings: Secondary | ICD-10-CM

## 2016-04-23 DIAGNOSIS — Z01419 Encounter for gynecological examination (general) (routine) without abnormal findings: Secondary | ICD-10-CM

## 2016-04-23 MED ORDER — NORGESTIMATE-ETH ESTRADIOL 0.25-35 MG-MCG PO TABS: 1.0000 | ORAL_TABLET | Freq: Every day | ORAL | Status: DC

## 2016-04-23 NOTE — Patient Instructions (Addendum)
1. Pap smear and GC/CT is performed 2. Self breast exam is encouraged 3. Patient is encouraged to continue with healthy eating and exercise along with weight loss 4. Sprintec is refilled 5. Return in 1 year for follow-up 6. Encourage condom use

## 2016-04-23 NOTE — Progress Notes (Signed)
ANNUAL PREVENTATIVE CARE GYN  ENCOUNTER NOTE  Subjective:       Janet Rich is a 20 y.o. G0P0000 female here for a routine annual gynecologic exam.  Current complaints: 1.  Dysmenorrhea-currently controlled on Spintec; only experiences mild cramping at this time 2. Abnormal uterine bleeding-currently controlled on Sprintec; has mild spotting at times but states is tolerable   Gynecologic History Patient's last menstrual period was 04/15/2016 (exact date). Contraception: OCP (estrogen/progesterone) Last Pap: unknown. Results were: unknown Obstetric History OB History  Gravida Para Term Preterm AB SAB TAB Ectopic Multiple Living  0 0 0 0 0 0 0 0 0 0         Past Medical History  Diagnosis Date  . Asthma   . Anxiety   . Depression   . Back pain     Past Surgical History  Procedure Laterality Date  . Wisdom tooth extraction  2013    Current Outpatient Prescriptions on File Prior to Visit  Medication Sig Dispense Refill  . ALPRAZolam (XANAX) 0.5 MG tablet Take 0.5 mg by mouth at bedtime as needed for anxiety.    . lamoTRIgine (LAMICTAL) 100 MG tablet Take 100 mg by mouth daily.  2  . QUEtiapine (SEROQUEL) 100 MG tablet Take 100 mg by mouth daily.  2   No current facility-administered medications on file prior to visit.    Allergies  Allergen Reactions  . Latex Rash    Social History   Social History  . Marital Status: Significant Other    Spouse Name: N/A  . Number of Children: N/A  . Years of Education: N/A   Occupational History  . Not on file.   Social History Main Topics  . Smoking status: Never Smoker   . Smokeless tobacco: Not on file  . Alcohol Use: Yes     Comment: occassionally  . Drug Use: No  . Sexual Activity: Yes    Birth Control/ Protection: Pill   Other Topics Concern  . Not on file   Social History Narrative    Family History  Problem Relation Age of Onset  . Endometriosis Mother   . Breast cancer Paternal Grandmother   .  Diabetes Neg Hx   . Heart disease Neg Hx   . Colon cancer Neg Hx   . Ovarian cancer Neg Hx     The following portions of the patient's history were reviewed and updated as appropriate: allergies, current medications, past family history, past medical history, past social history, past surgical history and problem list.  Review of Systems ROS Review of Systems - General ROS: negative for - chills, fatigue, fever, hot flashes, night sweats, weight gain or weight loss Psychological ROS: negative for - anxiety, decreased libido, depression, mood swings, physical abuse or sexual abuse Ophthalmic ROS: negative for - blurry vision, eye pain or loss of vision ENT ROS: negative for - headaches, hearing change, visual changes or vocal changes Allergy and Immunology ROS: negative for - hives, itchy/watery eyes or seasonal allergies Hematological and Lymphatic ROS: negative for - bleeding problems, bruising, swollen lymph nodes or weight loss Endocrine ROS: negative for - galactorrhea, hair pattern changes, hot flashes, malaise/lethargy, mood swings, palpitations, polydipsia/polyuria, skin changes, temperature intolerance or unexpected weight changes Breast ROS: negative for - new or changing breast lumps or nipple discharge Respiratory ROS: negative for - cough or shortness of breath Cardiovascular ROS: negative for - chest pain, irregular heartbeat, palpitations or shortness of breath Gastrointestinal ROS: no abdominal pain, change in bowel  habits, or black or bloody stools Genito-Urinary ROS: no dysuria, trouble voiding, or hematuria Musculoskeletal ROS: negative for - joint pain or joint stiffness Neurological ROS: negative for - bowel and bladder control changes Dermatological ROS: negative for rash and skin lesion changes   Objective:   BP 113/82 mmHg  Pulse 80  Ht 5\' 1"  (1.549 m)  Wt 179 lb 14.4 oz (81.602 kg)  BMI 34.01 kg/m2  LMP 04/15/2016 (Exact Date) CONSTITUTIONAL: Well-developed,  well-nourished female in no acute distress.  PSYCHIATRIC: Normal mood and affect. Normal behavior. Normal judgment and thought content. NEUROLGIC: Alert and oriented to person, place, and time. Normal muscle tone coordination. No cranial nerve deficit noted. HENT:  Normocephalic, atraumatic, External right and left ear normal. EYES: Conjunctivae are normal. No scleral icterus.  NECK: Normal range of motion, supple, no masses.  Normal thyroid.  SKIN: Skin is warm and dry. No rash noted. Not diaphoretic. No erythema. No pallor. CARDIOVASCULAR: Normal heart rate noted, regular rhythm, no murmur. RESPIRATORY: Clear to auscultation bilaterally. Effort and breath sounds normal, no problems with respiration noted. BREASTS: Symmetric in size. No masses, skin changes, nipple drainage, or lymphadenopathy. ABDOMEN: Soft, normal bowel sounds, no distention noted.  No tenderness, rebound or guarding.  BLADDER: Normal PELVIC:  External Genitalia: Normal  BUS: Normal  Vagina: Normal  Cervix: Normal, mobile, nontender  Uterus: Normal size and shape, nontender  Adnexa: Normal, nonpalpable, nontender MUSCULOSKELETAL: Normal range of motion. No tenderness.  No cyanosis, clubbing, or edema.  2+ distal pulses. LYMPHATIC: No Axillary, Supraclavicular, or Inguinal Adenopathy.    Assessment:   Annual gynecologic examination 20 y.o. Contraception: OCP (estrogen/progesterone) Overweight Problem List Items Addressed This Visit      Genitourinary   Dysmenorrhea     Other   Increased BMI (body mass index)    Other Visit Diagnoses    Well woman exam    -  Primary    Relevant Orders    Pap IG, CT/NG w/ reflex HPV when ASC-U    Pelvic pain in female           Plan:  Pap: Pap, Reflex if ASCUS and GC/CT NAAT Mammogram: Not Indicated Stool Guaiac Testing:  Not Indicated Routine preventative health maintenance measures emphasized: Exercise/Diet/Weight control, Tobacco Warnings, Alcohol/Substance use  risks, Stress Management, Peer Pressure Issues and Safe Sex  1. Return to Clinic - 1 Year for annual exam 2.Refill Sprintec for one year 3. Recommend a healthy lifestyle to reduce BMI   Mable FillPaige Owczarczak, PA-S Herold HarmsMartin A Ethelda Deangelo, MD   I have seen, interviewed, and examined the patient in conjunction with the [employer] and affirm the diagnosis and management plan. Shevette Bess A. Saya Mccoll, MD, FACOG  Note: This dictation was prepared with Dragon dictation along with smaller phrase technology. Any transcriptional errors that result from this process are unintentional.

## 2016-04-27 LAB — PAP IG, CT-NG, RFX HPV ASCU
CHLAMYDIA, NUC. ACID AMP: NEGATIVE
GONOCOCCUS BY NUCLEIC ACID AMP: NEGATIVE
PAP SMEAR COMMENT: 0

## 2016-04-28 ENCOUNTER — Telehealth: Payer: Self-pay

## 2016-05-23 ENCOUNTER — Inpatient Hospital Stay (HOSPITAL_COMMUNITY)
Admission: AD | Admit: 2016-05-23 | Discharge: 2016-05-23 | Disposition: A | Discharge status: Home / Self Care | Payer: 59 | Source: Ambulatory Visit | Attending: Family Medicine | Admitting: Family Medicine

## 2016-05-23 ENCOUNTER — Encounter (HOSPITAL_COMMUNITY): Payer: Self-pay

## 2016-05-23 DIAGNOSIS — N939 Abnormal uterine and vaginal bleeding, unspecified: Secondary | ICD-10-CM | POA: Insufficient documentation

## 2016-05-23 DIAGNOSIS — F419 Anxiety disorder, unspecified: Secondary | ICD-10-CM | POA: Diagnosis not present

## 2016-05-23 DIAGNOSIS — Z79899 Other long term (current) drug therapy: Secondary | ICD-10-CM | POA: Insufficient documentation

## 2016-05-23 DIAGNOSIS — N938 Other specified abnormal uterine and vaginal bleeding: Secondary | ICD-10-CM | POA: Diagnosis not present

## 2016-05-23 DIAGNOSIS — Z91040 Latex allergy status: Secondary | ICD-10-CM | POA: Diagnosis not present

## 2016-05-23 HISTORY — DX: Endometriosis, unspecified: N80.9

## 2016-05-23 LAB — URINALYSIS, ROUTINE W REFLEX MICROSCOPIC
BILIRUBIN URINE: NEGATIVE
GLUCOSE, UA: NEGATIVE mg/dL
KETONES UR: NEGATIVE mg/dL
Leukocytes, UA: NEGATIVE
NITRITE: NEGATIVE
PH: 6.5 (ref 5.0–8.0)
Protein, ur: NEGATIVE mg/dL
SPECIFIC GRAVITY, URINE: 1.01 (ref 1.005–1.030)

## 2016-05-23 LAB — HCG, QUANTITATIVE, PREGNANCY: hCG, Beta Chain, Quant, S: <1 m[IU]/mL (ref <5)

## 2016-05-23 LAB — URINE MICROSCOPIC-ADD ON

## 2016-05-23 LAB — POCT PREGNANCY, URINE: Preg Test, Ur: NEGATIVE

## 2016-05-23 NOTE — MAU Provider Note (Signed)
Chief Complaint:  No chief complaint on file.   First Provider Initiated Contact with Patient 05/23/16 1352     HPI Janet Rich is a 20 y.o. G0P0000 who presents to maternity admissions reporting vaginal bleeding for 3 days.  Bleeding is light.  Some pelvic cramping.  Thinks she is pregnant. Has been trying.   States she has been telling Dr Greggory Keen that she is taking her OCPs but has not been taking them.  She also stopped all of her anxiety meds. Has been using ovulation kits to watch for ovulation.  States she wants a blood pregnancy test "because I am only two weeks and the urine test will not see it". She reports vaginal bleeding, but no vaginal itching/burning, urinary symptoms, h/a, dizziness, n/v, or fever/chills.    RN Note: Patient thinks she is pregnant started bleeding about 3 days, seeing blood when wiping, having cramping on one side.  Past Medical History: Past Medical History:  Diagnosis Date  . Anxiety   . Asthma   . Back pain   . Depression   . Endometriosis     Past obstetric history: OB History  Gravida Para Term Preterm AB Living  0 0 0 0 0 0  SAB TAB Ectopic Multiple Live Births  0 0 0 0          Past Surgical History: Past Surgical History:  Procedure Laterality Date  . WISDOM TOOTH EXTRACTION  2013    Family History: Family History  Problem Relation Age of Onset  . Endometriosis Mother   . Breast cancer Paternal Grandmother   . Diabetes Neg Hx   . Heart disease Neg Hx   . Colon cancer Neg Hx   . Ovarian cancer Neg Hx     Social History: Social History  Substance Use Topics  . Smoking status: Never Smoker  . Smokeless tobacco: Never Used  . Alcohol use Yes     Comment: occassionally    Allergies:  Allergies  Allergen Reactions  . Latex Rash    Meds:  Prescriptions Prior to Admission  Medication Sig Dispense Refill Last Dose  . ALPRAZolam (XANAX) 0.5 MG tablet Take 0.5 mg by mouth at bedtime as needed for anxiety.   Past  Month at Unknown time  . lamoTRIgine (LAMICTAL) 100 MG tablet Take 100 mg by mouth daily.  2 Past Week at Unknown time  . QUEtiapine (SEROQUEL) 50 MG tablet Take 50 mg by mouth at bedtime.   Past Week at Unknown time  . norgestimate-ethinyl estradiol (PREVIFEM) 0.25-35 MG-MCG tablet Take 1 tablet by mouth daily. (Patient not taking: Reported on 05/23/2016) 84 tablet 4 Not Taking at Unknown time    I have reviewed patient's Past Medical Hx, Surgical Hx, Family Hx, Social Hx, medications and allergies.  ROS:  Review of Systems  Constitutional: Negative for chills and fever.  Gastrointestinal: Negative for constipation, diarrhea, nausea and vomiting.  Genitourinary: Positive for pelvic pain and vaginal bleeding. Negative for dysuria.  Neurological: Negative for dizziness.   Other systems negative     Physical Exam  Patient Vitals for the past 24 hrs:  BP Temp Temp src Pulse Resp Height Weight  05/23/16 1316 135/73 98.1 F (36.7 C) Oral 81 18 - -  05/23/16 1314 - - - - - 5\' 1"  (1.549 m) 187 lb (84.8 kg)   Constitutional: Well-developed, well-nourished female in no acute distress.  Cardiovascular: normal rate and rhythm, no ectopy audible, S1 & S2 heard, no murmur Respiratory: normal  effort, no distress. Lungs CTAB with no wheezes or crackles GI: Abd soft, non-tender.  Nondistended.  No rebound, No guarding.  Bowel Sounds audible  MS: Extremities nontender, no edema, normal ROM Neurologic: Alert and oriented x 4.   Grossly nonfocal. GU: Neg CVAT. Skin:  Warm and Dry Psych:  Affect appropriate.  PELVIC EXAM: Refused by patient, states she just had one and just wants test   Labs: Results for orders placed or performed during the hospital encounter of 05/23/16 (from the past 24 hour(s))  Urinalysis, Routine w reflex microscopic (not at [facility])     Status: Abnormal   Collection Time: 05/23/16  1:20 PM  Result Value Ref Range   Color, Urine YELLOW YELLOW   APPearance CLEAR CLEAR    Specific Gravity, Urine 1.010 1.005 - 1.030   pH 6.5 5.0 - 8.0   Glucose, UA NEGATIVE NEGATIVE mg/dL   Hgb urine dipstick TRACE (A) NEGATIVE   Bilirubin Urine NEGATIVE NEGATIVE   Ketones, ur NEGATIVE NEGATIVE mg/dL   Protein, ur NEGATIVE NEGATIVE mg/dL   Nitrite NEGATIVE NEGATIVE   Leukocytes, UA NEGATIVE NEGATIVE  Urine microscopic-add on     Status: Abnormal   Collection Time: 05/23/16  1:20 PM  Result Value Ref Range   Squamous Epithelial / LPF 0-5 (A) NONE SEEN   WBC, UA 0-5 0 - 5 WBC/hpf   RBC / HPF 0-5 0 - 5 RBC/hpf   Bacteria, UA RARE (A) NONE SEEN  Pregnancy, urine POC     Status: None   Collection Time: 05/23/16  1:29 PM  Result Value Ref Range   Preg Test, Ur NEGATIVE NEGATIVE  hCG, quantitative, pregnancy     Status: None   Collection Time: 05/23/16  2:05 PM  Result Value Ref Range   hCG, Beta Chain, Quant, S <1 <5 mIU/mL    Imaging:  No results found.  MAU Course/MDM: I have ordered labs as follows: See above Imaging ordered: none Results reviewed.  Informed patient of negative blood test.  States she is relieved it is not a miscarriage  Pt stable at time of discharge.  Assessment: No diagnosis found.  Plan: Discharge home Recommend discussing her wishes with Dr Greggory Keen regarding wishing to conceive.  Also advised to consult with her physician regarding anxiety meds    Encouraged to return here or to other Urgent Care/ED if she develops worsening of symptoms, increase in pain, fever, or other concerning symptoms.   Wynelle Bourgeois CNM, MSN Certified Nurse-Midwife 05/23/2016 2:31 PM

## 2016-05-23 NOTE — MAU Note (Signed)
Patient thinks she is pregnant started bleeding about 3 days, seeing blood when wiping, having cramping on one side.

## 2016-05-23 NOTE — Discharge Instructions (Signed)

## 2016-06-03 ENCOUNTER — Ambulatory Visit (INDEPENDENT_AMBULATORY_CARE_PROVIDER_SITE_OTHER): Payer: 59 | Admitting: Obstetrics and Gynecology

## 2016-06-03 ENCOUNTER — Encounter: Payer: Self-pay | Admitting: Obstetrics and Gynecology

## 2016-06-03 VITALS — BP 127/71 | HR 80 | Ht 61.0 in | Wt 189.3 lb

## 2016-06-03 DIAGNOSIS — R87622 Low grade squamous intraepithelial lesion on cytologic smear of vagina (LGSIL): Secondary | ICD-10-CM | POA: Diagnosis not present

## 2016-06-03 DIAGNOSIS — R87612 Low grade squamous intraepithelial lesion on cytologic smear of cervix (LGSIL): Secondary | ICD-10-CM

## 2016-06-03 DIAGNOSIS — N939 Abnormal uterine and vaginal bleeding, unspecified: Secondary | ICD-10-CM | POA: Diagnosis not present

## 2016-06-03 LAB — POCT URINE PREGNANCY: Preg Test, Ur: NEGATIVE

## 2016-06-03 NOTE — Patient Instructions (Signed)
1. Return in 6 months for repeat Pap smear

## 2016-06-10 LAB — PATHOLOGY

## 2016-06-15 ENCOUNTER — Other Ambulatory Visit: Payer: Self-pay | Admitting: Obstetrics and Gynecology

## 2016-06-17 NOTE — Progress Notes (Signed)
Procedure note: Colposcopy of cervix and upper adjacent vagina with biopsy   Indications: LGSIL Pap smear  Findings: No evidence of ectocervical disease  Biopsies:  ECC-negative   DESCRIPTION: Consent is given for colposcopy. Patient is placed in the dorsal lithotomy position. Speculum was placed in the vagina to facilitate cervix visualization. The cervix is cleansed with acetic acid solution. Colposcopy is performed with both normal and green filters with no ectocervical disease being identified. Squamocolumnar junction was not fully visualized. ECC was performed. Procedure was well-tolerated. Blood loss-minimal  ASSESSMENT: 1. LGSIL Pap smear 2. Inadequate colposcopy with inability to see fully the squamocolumnar junction 3. No evidence of ectocervical disease  PLAN: 1. Colposcopy with ECC as noted 2. Avoid tobacco products 3. Return in 6 months for repeat colposcopy/Pap  Herold HarmsMartin A Ysidro Ramsay, MD  Note: This dictation was prepared with Dragon dictation along with smaller phrase technology. Any transcriptional errors that result from this process are unintentional.

## 2016-07-03 ENCOUNTER — Telehealth: Payer: Self-pay | Admitting: Obstetrics and Gynecology

## 2016-07-03 NOTE — Telephone Encounter (Signed)
Pt is having cramps on/off. X 3/4 days. lmp 8/29/201. NO uti sx. Pain is a 5. NO ocp. Trying to conceive. NO vb or vd. NO vaginal itching. NO fever. Tylenol not helping. N/v x 2days. Only vomiting once. Advised ibup 800 q 8 hours. May add tylenol 500 q 4h. Can add zantac 150 at bedtime to help with n/v. Pt aware to make an appt if sx persists for 1 week or gets worse.

## 2016-07-03 NOTE — Telephone Encounter (Signed)
PT CALLED AND SHE HAS ENDOMETRIOSIS AND SHE IS TRYING TO CONCEIVE AND SHE IS OFF OF HER BC AND SHE WANTED TO TALK TO YOU ABOUT THE MEDICATIONS SHE CAN TAKE AND SHE IS HAVING REALLY BAD NAUSEA AND SHE IS CRAMPING.

## 2016-07-03 NOTE — Telephone Encounter (Signed)
lmtrc

## 2016-07-15 ENCOUNTER — Other Ambulatory Visit: Payer: Self-pay | Admitting: Obstetrics and Gynecology

## 2016-07-15 ENCOUNTER — Ambulatory Visit (INDEPENDENT_AMBULATORY_CARE_PROVIDER_SITE_OTHER): Payer: 59 | Admitting: Obstetrics and Gynecology

## 2016-07-15 ENCOUNTER — Ambulatory Visit (INDEPENDENT_AMBULATORY_CARE_PROVIDER_SITE_OTHER): Payer: 59

## 2016-07-15 VITALS — BP 134/83 | HR 83 | Ht 61.0 in | Wt 193.6 lb

## 2016-07-15 DIAGNOSIS — N938 Other specified abnormal uterine and vaginal bleeding: Secondary | ICD-10-CM

## 2016-07-15 DIAGNOSIS — N946 Dysmenorrhea, unspecified: Secondary | ICD-10-CM

## 2016-07-15 DIAGNOSIS — Z842 Family history of other diseases of the genitourinary system: Secondary | ICD-10-CM | POA: Diagnosis not present

## 2016-07-15 DIAGNOSIS — R102 Pelvic and perineal pain: Secondary | ICD-10-CM | POA: Diagnosis not present

## 2016-07-15 MED ORDER — IBUPROFEN 800 MG PO TABS: 800.0000 mg | ORAL_TABLET | Freq: Three times a day (TID) | ORAL | 1 refills | Status: DC | PRN

## 2016-07-15 NOTE — Patient Instructions (Signed)
1. Urine GC/CT nucleic acid amplification test 2. Pelvic ultrasound 3. Ibuprofen 800 mg 3 times a day. 4. Acetaminophen 1 g 4 times a day 5. Follow-up after ultrasound for further management planning 6. Follow-up in 3 months for reassessment

## 2016-07-15 NOTE — Addendum Note (Signed)
Addended by: Marchelle FolksMILLER, Sawyer Kahan G on: 07/15/2016 11:59 AM   Modules accepted: Orders

## 2016-07-15 NOTE — Progress Notes (Signed)
GYN ENCOUNTER NOTE  Subjective:       Janet Rich is a 20 y.o. G0P0000 female is here for gynecologic evaluation of the following issues:  1. Acute pelvic pain  [place] discontinued oral contraceptives 2 months ago. First menses after stopping OCPs was tolerable.   3 weeks ago experienced right lower quadrant stabbing pain, severe. 3 days ago she developed severe menses with heavy bleeding and extreme dysmenorrhea) in ibuprofen are not controlling pelvic pain.  Patient has probable endometriosis by history; also has family history of endometriosis  Patient denies fever, chills, sweats, change in bowel or bladder function. No new sex partners. Currently attempting to conceive   Gynecologic History Patient's last menstrual period was 07/12/2016. Contraception: none L Obstetric History OB History  Gravida Para Term Preterm AB Living  0 0 0 0 0 0  SAB TAB Ectopic Multiple Live Births  0 0 0 0          Past Medical History:  Diagnosis Date  . Anxiety   . Asthma   . Back pain   . Depression   . Endometriosis     Past Surgical History:  Procedure Laterality Date  . WISDOM TOOTH EXTRACTION  2013    No current outpatient prescriptions on file prior to visit.   No current facility-administered medications on file prior to visit.     Allergies  Allergen Reactions  . Latex Rash    Social History   Social History  . Marital status: Single    Spouse name: N/A  . Number of children: N/A  . Years of education: N/A   Occupational History  . Not on file.   Social History Main Topics  . Smoking status: Never Smoker  . Smokeless tobacco: Never Used  . Alcohol use Yes     Comment: occassionally  . Drug use: No  . Sexual activity: Yes    Birth control/ protection: None   Other Topics Concern  . Not on file   Social History Narrative  . No narrative on file    Family History  Problem Relation Age of Onset  . Endometriosis Mother   . Breast cancer Paternal  Grandmother   . Diabetes Neg Hx   . Heart disease Neg Hx   . Colon cancer Neg Hx   . Ovarian cancer Neg Hx     The following portions of the patient's history were reviewed and updated as appropriate: allergies, current medications, past family history, past medical history, past social history, past surgical history and problem list.  Review of Systems Review of Systems - Per history of present illness  Objective:   BP 134/83   Pulse 83   Ht 5\' 1"  (1.549 m)   Wt 193 lb 9.6 oz (87.8 kg)   LMP 07/12/2016   BMI 36.58 kg/m  CONSTITUTIONAL: Well-developed, well-nourished female in no acute distress.  HENT:  Normocephalic, atraumatic.  NECK: Normal range of motion, supple, no masses.  Normal thyroid.  SKIN: Skin is warm and dry. No rash noted. Not diaphoretic. No erythema. No pallor. NEUROLGIC: Alert and oriented to person, place, and time. PSYCHIATRIC: Normal mood and affect. Normal behavior. Normal judgment and thought content. CARDIOVASCULAR:Not Examined RESPIRATORY: Not Examined BREASTS: Not Examined ABDOMEN: Soft, non distended; Non tender.  No Organomegaly. PELVIC:  External Genitalia: Normal  BUS: Normal  Vagina: Normal  Cervix: Normal  Uterus: Normal size, shape,consistency, mobile  Adnexa: Normal  RV: Normal   Bladder: Nontender MUSCULOSKELETAL: Normal range of motion.  No tenderness.  No cyanosis, clubbing, or edema.     Assessment:   1. Pelvic pain in female - US Pelvis Complete; Future - US Transvaginal Non-OB; Future - GC/Chlamydia Probe Amp - HIV antibody (with reflex)  2. Dysmenorrhea  3. Probable endometriosis  4. Family history of endometriosis     Plan:   1. Urine GC/CT nucleic acid amplification test 2. Pelvic ultrasound 3. Ibuprofen 800 mg 3 times a day. 4. Acetaminophen 1 g 4 times a day 5. Follow-up after ultrasound for further management planning 6. Follow-up in 3 months for reassessment  A total of 15 minutes were spent  face-to-face with the patient during this encounter and over half of that time dealt with counseling and coordination of care.  Herold HarmsMartin A Kenzly Rogoff, MD  Note: This dictation was prepared with Dragon dictation along with smaller phrase technology. Any transcriptional errors that result from this process are unintentional.

## 2016-07-16 LAB — GC/CHLAMYDIA PROBE AMP
CHLAMYDIA, DNA PROBE: NEGATIVE
NEISSERIA GONORRHOEAE BY PCR: NEGATIVE

## 2016-07-16 LAB — HIV ANTIBODY (ROUTINE TESTING W REFLEX): HIV SCREEN 4TH GENERATION: NONREACTIVE

## 2016-07-24 ENCOUNTER — Telehealth: Payer: Self-pay | Admitting: Obstetrics and Gynecology

## 2016-07-24 NOTE — Telephone Encounter (Signed)
THIS PT HAS MIGRAINES AND HAS HAD 2 IN A ROW AND SHE SAYS ITS HORMONAL, SHE STARTED HER PERIOD 1 WK 1/2 AGO AND WANTS A SERUM PREGNANCY TEST DONE TO MAKE SURE SHE NOT PREGNANT AND THAT NOT WHATS CAUSING IT.

## 2016-07-27 NOTE — Telephone Encounter (Signed)
Pt has a h/o of migraine h/a. She has had 3 this past week. Excedrin mirgaine is not helping that much. She thinks they are hormonal. LMP 07/11/2016. Desires beta. Explained to pt that cycle was not late so no need for beta at this time. Pt does not have pcp. Advised will send message to mad for rx for h/a. Aware he will get this message on Tuesday.

## 2016-07-28 ENCOUNTER — Encounter: Payer: Self-pay | Admitting: Obstetrics and Gynecology

## 2016-07-29 ENCOUNTER — Encounter (HOSPITAL_COMMUNITY): Payer: Self-pay

## 2016-07-29 DIAGNOSIS — J45909 Unspecified asthma, uncomplicated: Secondary | ICD-10-CM | POA: Diagnosis not present

## 2016-07-29 DIAGNOSIS — N83201 Unspecified ovarian cyst, right side: Secondary | ICD-10-CM | POA: Insufficient documentation

## 2016-07-29 DIAGNOSIS — Z91040 Latex allergy status: Secondary | ICD-10-CM | POA: Insufficient documentation

## 2016-07-29 DIAGNOSIS — R1031 Right lower quadrant pain: Secondary | ICD-10-CM | POA: Diagnosis present

## 2016-07-29 LAB — COMPREHENSIVE METABOLIC PANEL
ALT: 20 U/L (ref 14–54)
ANION GAP: 7 (ref 5–15)
AST: 17 U/L (ref 15–41)
Albumin: 3.5 g/dL (ref 3.5–5.0)
Alkaline Phosphatase: 75 U/L (ref 38–126)
BILIRUBIN TOTAL: 0.4 mg/dL (ref 0.3–1.2)
BUN: 7 mg/dL (ref 6–20)
CO2: 25 mmol/L (ref 22–32)
Calcium: 9 mg/dL (ref 8.9–10.3)
Chloride: 103 mmol/L (ref 101–111)
Creatinine, Ser: 0.57 mg/dL (ref 0.44–1.00)
Glucose, Bld: 98 mg/dL (ref 65–99)
POTASSIUM: 3.9 mmol/L (ref 3.5–5.1)
Sodium: 135 mmol/L (ref 135–145)
TOTAL PROTEIN: 7.3 g/dL (ref 6.5–8.1)

## 2016-07-29 LAB — CBC
HEMATOCRIT: 37.8 % (ref 36.0–46.0)
HEMOGLOBIN: 12 g/dL (ref 12.0–15.0)
MCH: 24.9 pg — ABNORMAL LOW (ref 26.0–34.0)
MCHC: 31.7 g/dL (ref 30.0–36.0)
MCV: 78.6 fL (ref 78.0–100.0)
Platelets: 420 10*3/uL — ABNORMAL HIGH (ref 150–400)
RBC: 4.81 MIL/uL (ref 3.87–5.11)
RDW: 15.7 % — ABNORMAL HIGH (ref 11.5–15.5)
WBC: 9.9 10*3/uL (ref 4.0–10.5)

## 2016-07-29 LAB — LIPASE, BLOOD: Lipase: 23 U/L (ref 11–51)

## 2016-07-29 LAB — URINALYSIS, ROUTINE W REFLEX MICROSCOPIC
Bilirubin Urine: NEGATIVE
Glucose, UA: NEGATIVE mg/dL
Hgb urine dipstick: NEGATIVE
KETONES UR: NEGATIVE mg/dL
LEUKOCYTES UA: NEGATIVE
NITRITE: NEGATIVE
PH: 6.5 (ref 5.0–8.0)
Protein, ur: NEGATIVE mg/dL
SPECIFIC GRAVITY, URINE: 1.008 (ref 1.005–1.030)

## 2016-07-29 LAB — POC URINE PREG, ED: PREG TEST UR: NEGATIVE

## 2016-07-29 NOTE — ED Triage Notes (Signed)
Pt states she started having lower right abdominal pain for a few days; pt states she has had multiple n/v episodes; pt c/o HA with symptoms; [facility] at [facility] and was sent here to rule out Appendicitis; pt states pain is 5/10 on arrival; pt a&ox 4

## 2016-07-30 ENCOUNTER — Emergency Department (HOSPITAL_COMMUNITY)
Admission: EM | Admit: 2016-07-30 | Discharge: 2016-07-30 | Disposition: A | Discharge status: Home / Self Care | Payer: 59 | Attending: Emergency Medicine | Admitting: Emergency Medicine

## 2016-07-30 ENCOUNTER — Telehealth: Payer: Self-pay | Admitting: Obstetrics and Gynecology

## 2016-07-30 ENCOUNTER — Emergency Department (HOSPITAL_COMMUNITY): Payer: 59

## 2016-07-30 DIAGNOSIS — R102 Pelvic and perineal pain unspecified side: Secondary | ICD-10-CM

## 2016-07-30 DIAGNOSIS — N83201 Unspecified ovarian cyst, right side: Secondary | ICD-10-CM

## 2016-07-30 MED ORDER — HYDROMORPHONE HCL 1 MG/ML IJ SOLN
1.0000 mg | Freq: Once | INTRAMUSCULAR | Status: AC
  Administered 2016-07-30: 1 mg via INTRAVENOUS
  Filled 2016-07-30: qty 1

## 2016-07-30 MED ORDER — PROMETHAZINE HCL 25 MG PO TABS: 25.0000 mg | ORAL_TABLET | Freq: Four times a day (QID) | ORAL | 0 refills | Status: DC | PRN

## 2016-07-30 MED ORDER — IOPAMIDOL (ISOVUE-300) INJECTION 61%
INTRAVENOUS | Status: AC
  Filled 2016-07-30: qty 100

## 2016-07-30 MED ORDER — ONDANSETRON HCL 4 MG PO TABS: 4.0000 mg | ORAL_TABLET | Freq: Four times a day (QID) | ORAL | 0 refills | Status: DC

## 2016-07-30 MED ORDER — PROMETHAZINE HCL 25 MG RE SUPP: 25.0000 mg | Freq: Four times a day (QID) | RECTAL | 0 refills | Status: DC | PRN

## 2016-07-30 MED ORDER — ONDANSETRON HCL 4 MG/2ML IJ SOLN
4.0000 mg | Freq: Once | INTRAMUSCULAR | Status: AC
  Administered 2016-07-30: 4 mg via INTRAVENOUS
  Filled 2016-07-30: qty 2

## 2016-07-30 MED ORDER — HYDROCODONE-ACETAMINOPHEN 5-325 MG PO TABS: 1.0000 | ORAL_TABLET | Freq: Four times a day (QID) | ORAL | 0 refills | Status: DC | PRN

## 2016-07-30 MED ORDER — IOPAMIDOL (ISOVUE-300) INJECTION 61%
100.0000 mL | Freq: Once | INTRAVENOUS | Status: AC | PRN
  Administered 2016-07-30: 100 mL via INTRAVENOUS

## 2016-07-30 NOTE — Telephone Encounter (Signed)
Pt was seen in er last nite for complex rt ovarian cyst. Given Zofran and Vicodin. She is unable to keep anything down. Per mad she may have phenergan po or rectal. Per pt sent in phenergan po and suppository. Pt advised to sip h20. Once n/v under controlled she will take vicodin q 6 and 800 q8. F/u appt with mad on 10/17 at 10:45. Pt will send my chart message tomorrow with update.

## 2016-07-30 NOTE — Telephone Encounter (Signed)
Pt is calling you back in reference to your last conversation. She was seen in the ER and she said she was supposed to give you a call to let you know how she is doing.

## 2016-07-30 NOTE — ED Provider Notes (Signed)
[facility]-EMERGENCY DEPT Provider Note   CSN: [id number] Arrival date & time: 07/29/16  2019     History   Chief Complaint Chief Complaint  Patient presents with  . Abdominal Pain    HPI Janet Rich is a 20 y.o. female.  Patient presents to the emergency department with chief complaint of gradually worsening right lower quadrant pain. She states the symptoms started 2-3 days ago. She states that the pain is been gradually worsening. She reports associated nausea and vomiting. Chest reports that she has had a headache. She denies any fevers or chills, she has not taken anything for symptoms. She rates her pain as an 8 out of 10. She was sent by urgent care to rule out appendicitis. Last oral intake was around 1:00 this afternoon. She has no other associated symptoms. She denies any vaginal complaints, denies vaginal discharge or bleeding.   The history is provided by the patient. No language interpreter was used.    Past Medical History:  Diagnosis Date  . Anxiety   . Asthma   . Back pain   . Depression   . Endometriosis     Patient Active Problem List   Diagnosis Date Noted  . Dysmenorrhea 05/01/2015  . History of migraine headaches 05/01/2015  . Anxiety and depression 05/01/2015  . Increased BMI (body mass index) 05/01/2015  . Family history of endometriosis in first degree relative 05/01/2015    Past Surgical History:  Procedure Laterality Date  . WISDOM TOOTH EXTRACTION  2013    OB History    Gravida Para Term Preterm AB Living   0 0 0 0 0 0   SAB TAB Ectopic Multiple Live Births   0 0 0 0         Home Medications    Prior to Admission medications   Medication Sig Start Date End Date Taking? Authorizing Provider  ibuprofen (ADVIL,MOTRIN) 800 MG tablet Take 1 tablet (800 mg total) by mouth every 8 (eight) hours as needed for moderate pain. 07/15/16   Prentice Docker Defrancesco, MD  ibuprofen (ADVIL,MOTRIN) 800 MG tablet Take 1 tablet (800 mg total) by mouth every  8 (eight) hours as needed. 07/15/16   Herold Harms, MD    Family History Family History  Problem Relation Age of Onset  . Endometriosis Mother   . Breast cancer Paternal Grandmother   . Diabetes Neg Hx   . Heart disease Neg Hx   . Colon cancer Neg Hx   . Ovarian cancer Neg Hx     Social History Social History  Substance Use Topics  . Smoking status: Never Smoker  . Smokeless tobacco: Never Used  . Alcohol use Yes     Comment: occassionally     Allergies   Latex   Review of Systems Review of Systems  Constitutional: Negative for fever.  Gastrointestinal: Positive for abdominal pain, nausea and vomiting.  All other systems reviewed and are negative.    Physical Exam Updated Vital Signs BP 141/79   Pulse 80   Temp 98.2 F (36.8 C) (Oral)   Resp 24   Wt 88.5 kg   LMP 07/12/2016   SpO2 100%   BMI 36.88 kg/m   Physical Exam  Constitutional: She is oriented to person, place, and time. She appears well-developed and well-nourished.  HENT:  Head: Normocephalic and atraumatic.  Eyes: Conjunctivae and EOM are normal. Pupils are equal, round, and reactive to light.  Neck: Normal range of motion. Neck supple.  Cardiovascular:  Normal rate and regular rhythm.  Exam reveals no gallop and no friction rub.   No murmur heard. Pulmonary/Chest: Effort normal and breath sounds normal. No respiratory distress. She has no wheezes. She has no rales. She exhibits no tenderness.  Abdominal: Soft. Bowel sounds are normal. She exhibits no distension and no mass. There is tenderness. There is no rebound and no guarding.  Significant right lower quadrant tenderness palpation at McBurney's point, no other focal abdominal tenderness  Musculoskeletal: Normal range of motion. She exhibits no edema or tenderness.  Neurological: She is alert and oriented to person, place, and time.  Skin: Skin is warm and dry.  Psychiatric: She has a normal mood and affect. Her behavior is normal.  Judgment and thought content normal.  Nursing note and vitals reviewed.    ED Treatments / Results  Labs (all labs ordered are listed, but only abnormal results are displayed) Labs Reviewed  CBC - Abnormal; Notable for the following:       Result Value   MCH 24.9 (*)    RDW 15.7 (*)    Platelets 420 (*)    All other components within normal limits  LIPASE, BLOOD  COMPREHENSIVE METABOLIC PANEL  URINALYSIS, ROUTINE W REFLEX MICROSCOPIC (NOT AT [facility])  POC URINE PREG, ED    EKG  EKG Interpretation None       Radiology No results found.  Procedures Procedures (including critical care time)  Medications Ordered in ED Medications  HYDROmorphone (DILAUDID) injection 1 mg (not administered)  ondansetron (ZOFRAN) injection 4 mg (not administered)     Initial Impression / Assessment and Plan / ED Course  I have reviewed the triage vital signs and the nursing notes.  Pertinent labs & imaging results that were available during my care of the patient were reviewed by me and considered in my medical decision making (see chart for details).  Clinical Course    Patient with significant right lower quadrant tenderness. Vital signs laboratory workup is reassuring. Pregnancy test negative. Will check CT to rule out appendicitis.  CT negative for appy, but shows complex ovarian cyst. Intermittent torsion is not rule out and radiology recommends pelvic US.  Pelvic US shows normal doppler wave forms to the right ovary ruling out torsion.  Patient's pain is well controlled.  Will discharge to home with OBGYN follow-up.  Patient understands and agrees with the plan.  Final Clinical Impressions(s) / ED Diagnoses   Final diagnoses:  Cyst of right ovary    New Prescriptions New Prescriptions   HYDROCODONE-ACETAMINOPHEN (NORCO/VICODIN) 5-325 MG TABLET    Take 1-2 tablets by mouth every 6 (six) hours as needed.   ONDANSETRON (ZOFRAN) 4 MG TABLET    Take 1 tablet (4 mg total) by  mouth every 6 (six) hours.     Roxy Horsemanobert Dae Antonucci, PA-C 07/30/16 [id number]    Zadie Rhineonald Wickline, MD 07/30/16 [phone]

## 2016-08-04 ENCOUNTER — Encounter: Payer: Self-pay | Admitting: Obstetrics and Gynecology

## 2016-08-04 ENCOUNTER — Ambulatory Visit (INDEPENDENT_AMBULATORY_CARE_PROVIDER_SITE_OTHER): Payer: 59 | Admitting: Obstetrics and Gynecology

## 2016-08-04 VITALS — BP 108/77 | HR 80 | Ht 61.0 in | Wt 194.6 lb

## 2016-08-04 DIAGNOSIS — Z842 Family history of other diseases of the genitourinary system: Secondary | ICD-10-CM | POA: Diagnosis not present

## 2016-08-04 DIAGNOSIS — R102 Pelvic and perineal pain unspecified side: Secondary | ICD-10-CM

## 2016-08-04 DIAGNOSIS — N83201 Unspecified ovarian cyst, right side: Secondary | ICD-10-CM | POA: Diagnosis not present

## 2016-08-04 NOTE — Patient Instructions (Signed)
1.ultrasound is ordered in 5 weeks  2. return in 6 weeks for follow up  3. Recommend Tylenol and ibuprofen as need for pain relief

## 2016-08-04 NOTE — Telephone Encounter (Signed)
Pt aware mad advised her to get pcp to manage h/a.

## 2016-08-04 NOTE — Progress Notes (Signed)
Chief complaint: 1. Right ovarian cyst 2. Pelvic pain  Patient presents for follow up after being seen in the emergency room for severe right lower quadrant pain. Workup demonstrated a complex right ovarian cyst measuring 4.7 cm.Previous scan in September demonstrated normal ovaries. Pain is now a 4 out of 10, improved from when she was seen in the emergency room. No longer using narcotics.  Bowel and bladder function are normal  OBJECTIVE: BP 108/77   Pulse 80   Ht 5\' 1"  (1.549 m)   Wt 194 lb 9.6 oz (88.3 kg)   LMP 07/12/2016   BMI 36.77 kg/m  Pleasant white female in no acute distress Abdomen: Soft, non tender, without peritoneal signs Pelvic exam: External genitalia-normal BUS-normal Vagina-normal Cervix-no cervical motion Uterus-midplane, normal size and normal shape Adnexa-mild bilateral tenderness right greater than left 1/4; no palpable mass Rectovaginal-normal external exam  ASSESSMENT: 1. Complex right ovarian cyst 4.7 cm, non acute 2. Family history of endometriosis 3. Chronic pelvic pain 4. Currently trying to conceive  PLAN: 1. Repeat ultrasound in 5 weeks 2. Return to clinic in 6 weeks 3. Tylenol and ibuprofen when necessary 4. Return as needed if symptoms worsen  A total of 15 minutes were spent face-to-face with the patient during this encounter and over half of that time dealt with counseling and coordination of care.   Herold HarmsMartin A Defrancesco, MD  Note: This dictation was prepared with Dragon dictation along with smaller phrase technology. Any transcriptional errors that result from this process are unintentional.

## 2016-09-08 ENCOUNTER — Other Ambulatory Visit: Payer: 59

## 2016-09-15 ENCOUNTER — Encounter: Payer: 59 | Admitting: Obstetrics and Gynecology

## 2016-09-23 IMAGING — DX DG ANKLE COMPLETE 3+V*R*
1 series · 3 of 3 positions shown · non-contrast
Comparison: None.

CLINICAL DATA: Injury with pain.

EXAM:
RIGHT ANKLE - COMPLETE 3+ VIEW

[Series 1: dg ankle complete right · 0.14mm/px · 3 of 3 slices shown]
[im 1/3]
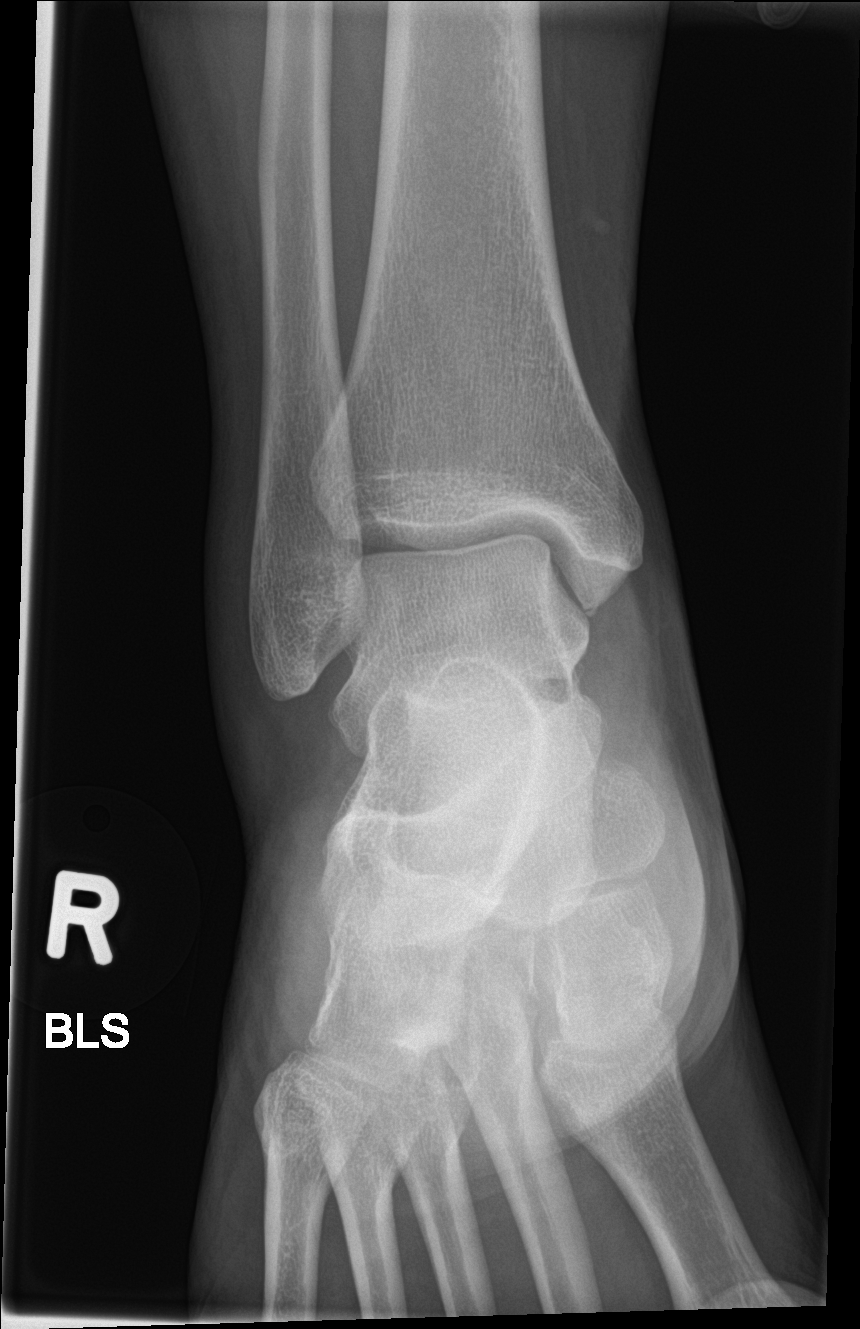
[im 2/3]
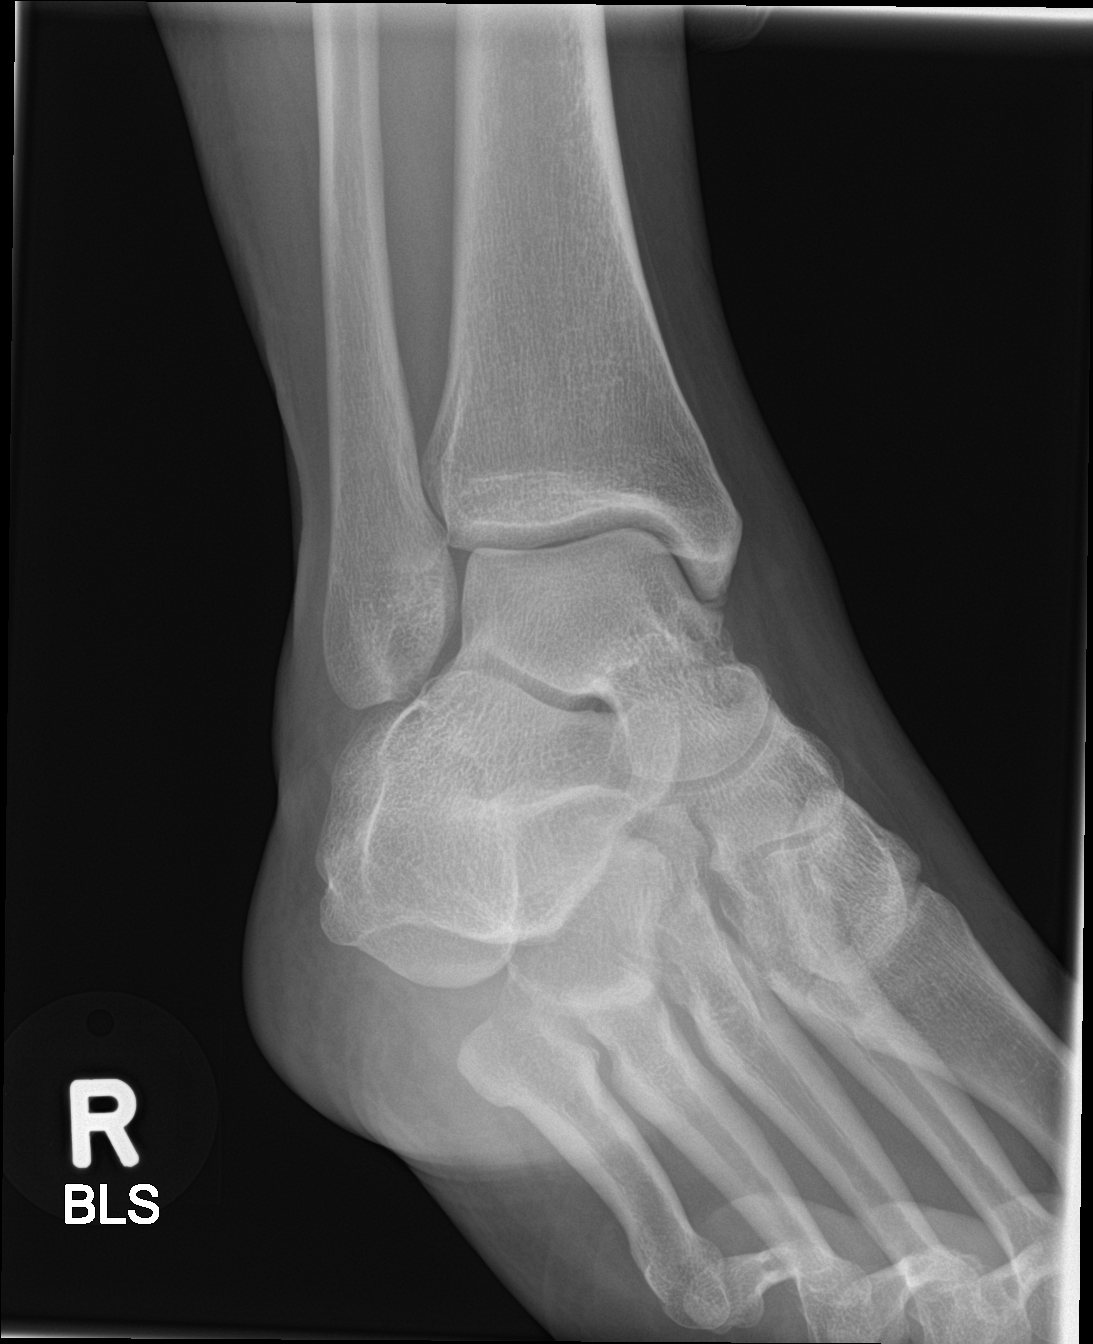
[im 3/3]
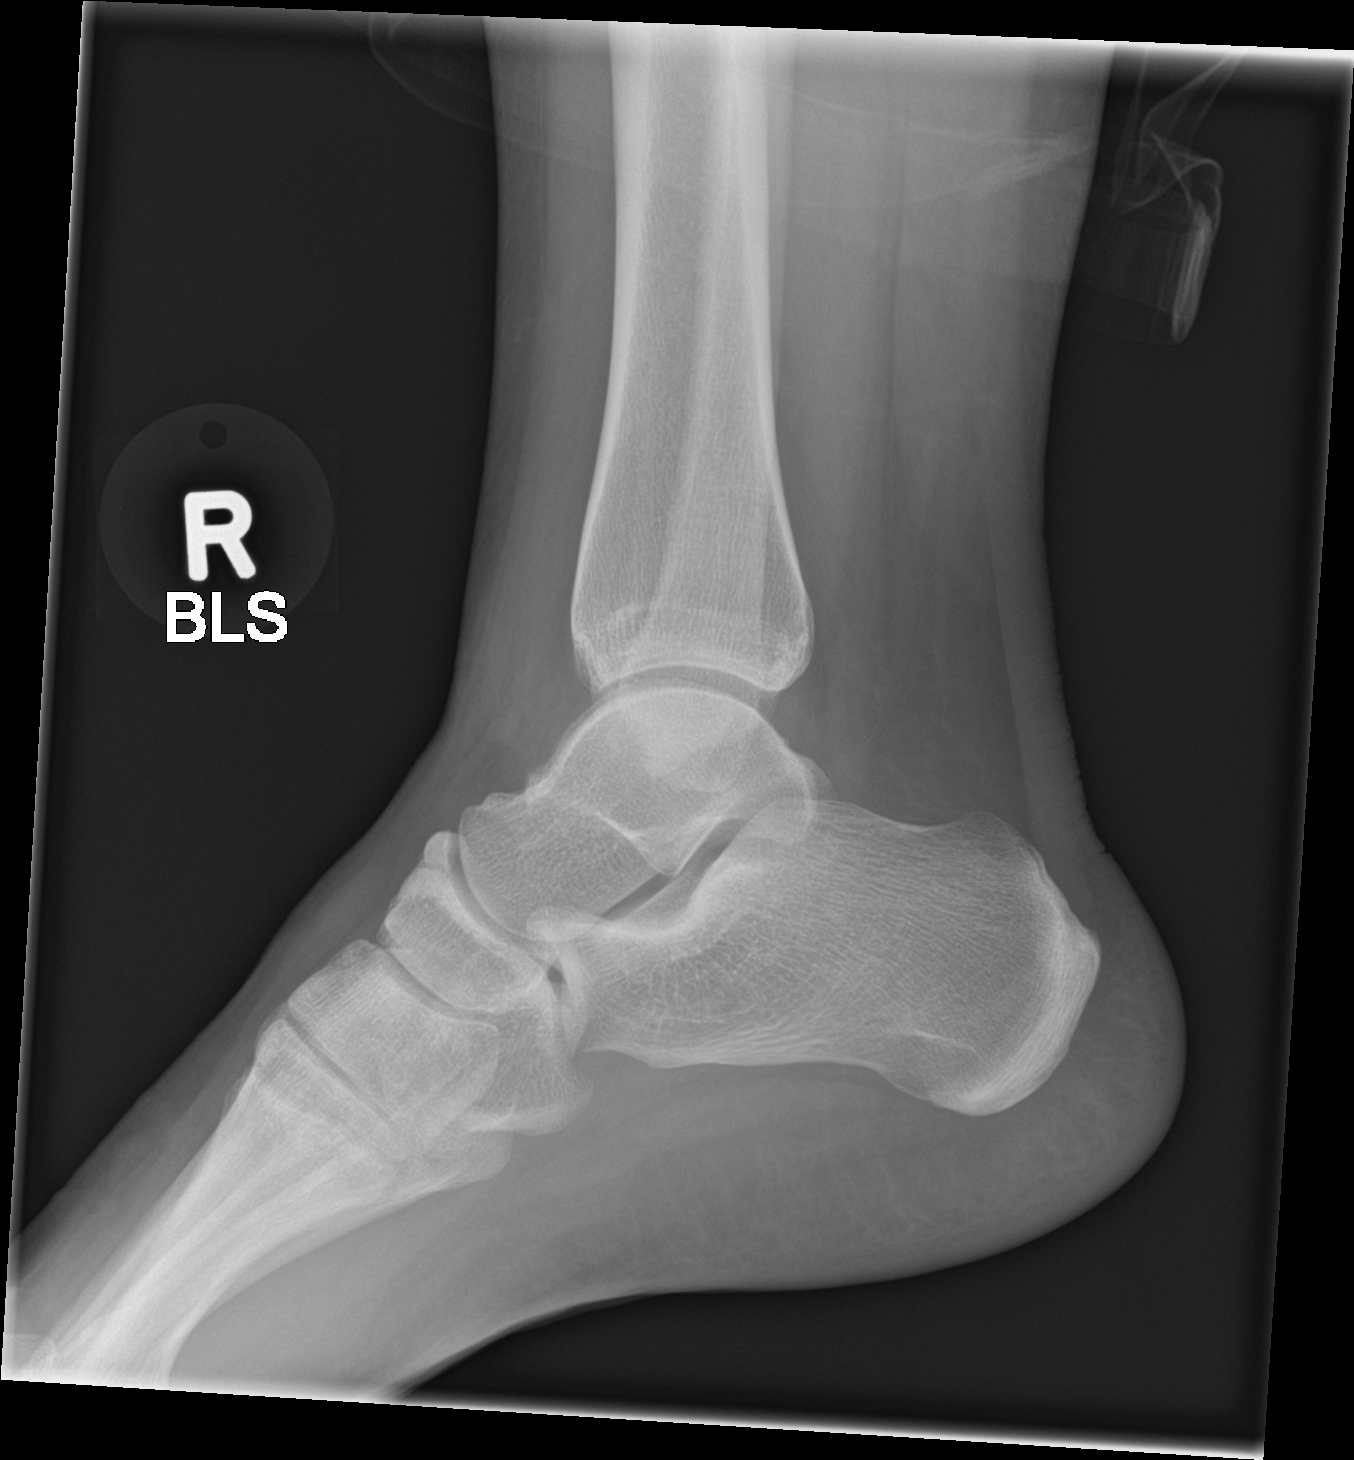

[3 of 3 positions shown; findings below may reference images not displayed]

FINDINGS: There is a small fracture along the inferior tip of the medial
malleolus, nondisplaced and non comminuted, that appears acute, but
could be chronic.

No other evidence of a fracture. Ankle mortise is normally spaced
and aligned. There is soft tissue swelling that predominates
laterally.
IMPRESSION: Small fracture along the inferior tip of the medial malleolus. No
other evidence of a fracture. No dislocation.

## 2016-09-23 IMAGING — CR DG FOOT COMPLETE 3+V*R*
1 series · 3 of 3 positions shown · non-contrast
Comparison: None.

CLINICAL DATA: Lateral medial foot pain.  Pain extends to the toes.

EXAM:
RIGHT FOOT COMPLETE - 3+ VIEW

[Series 1: ap · 0.17mm/px · 3 of 3 slices shown]
[im 1/3]
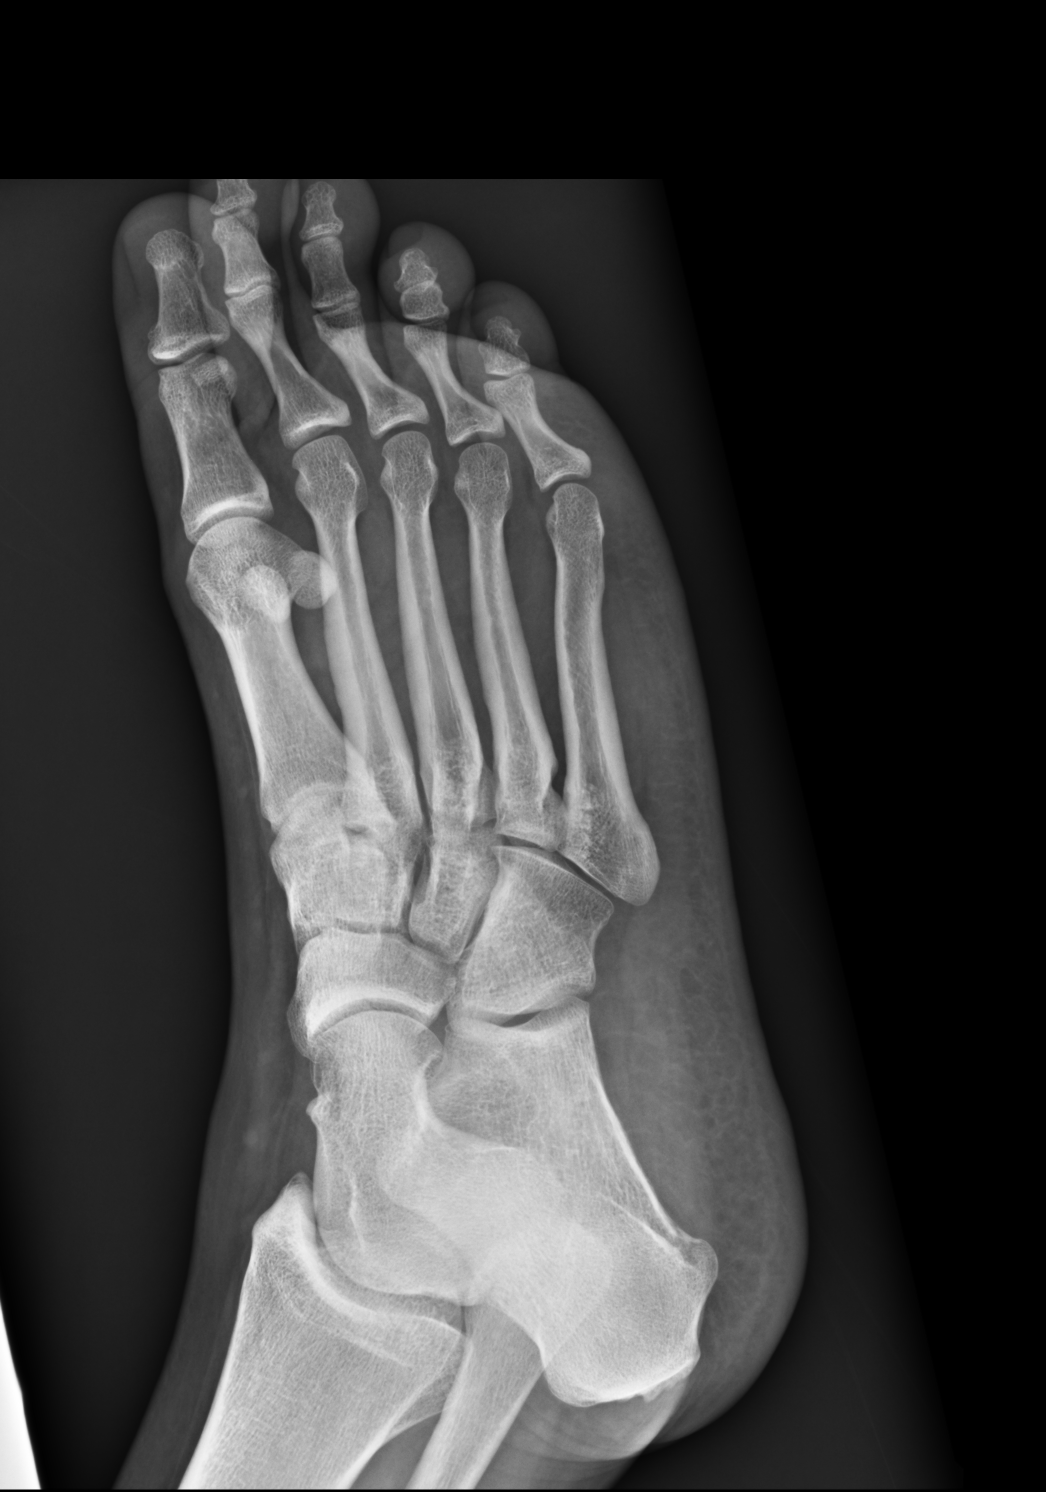
[im 2/3]
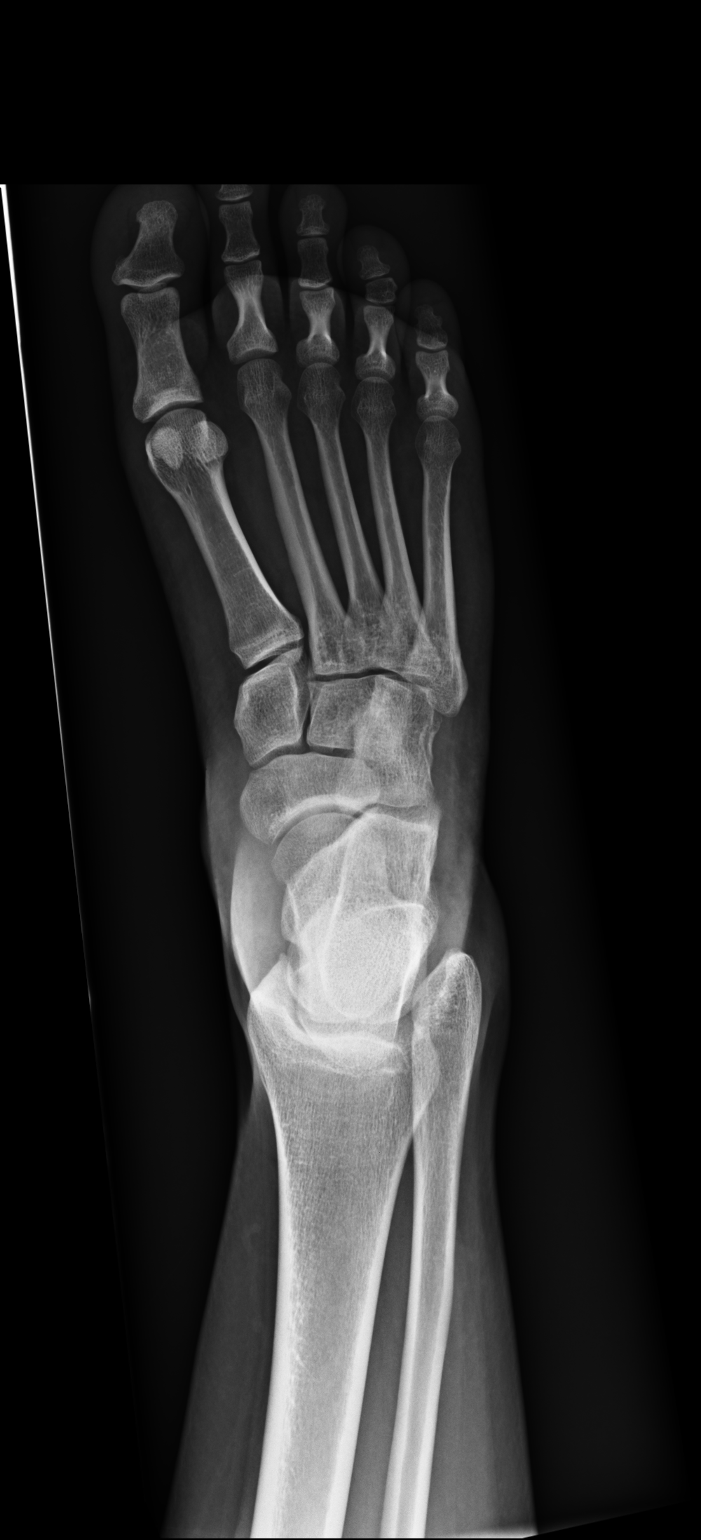
[im 3/3]
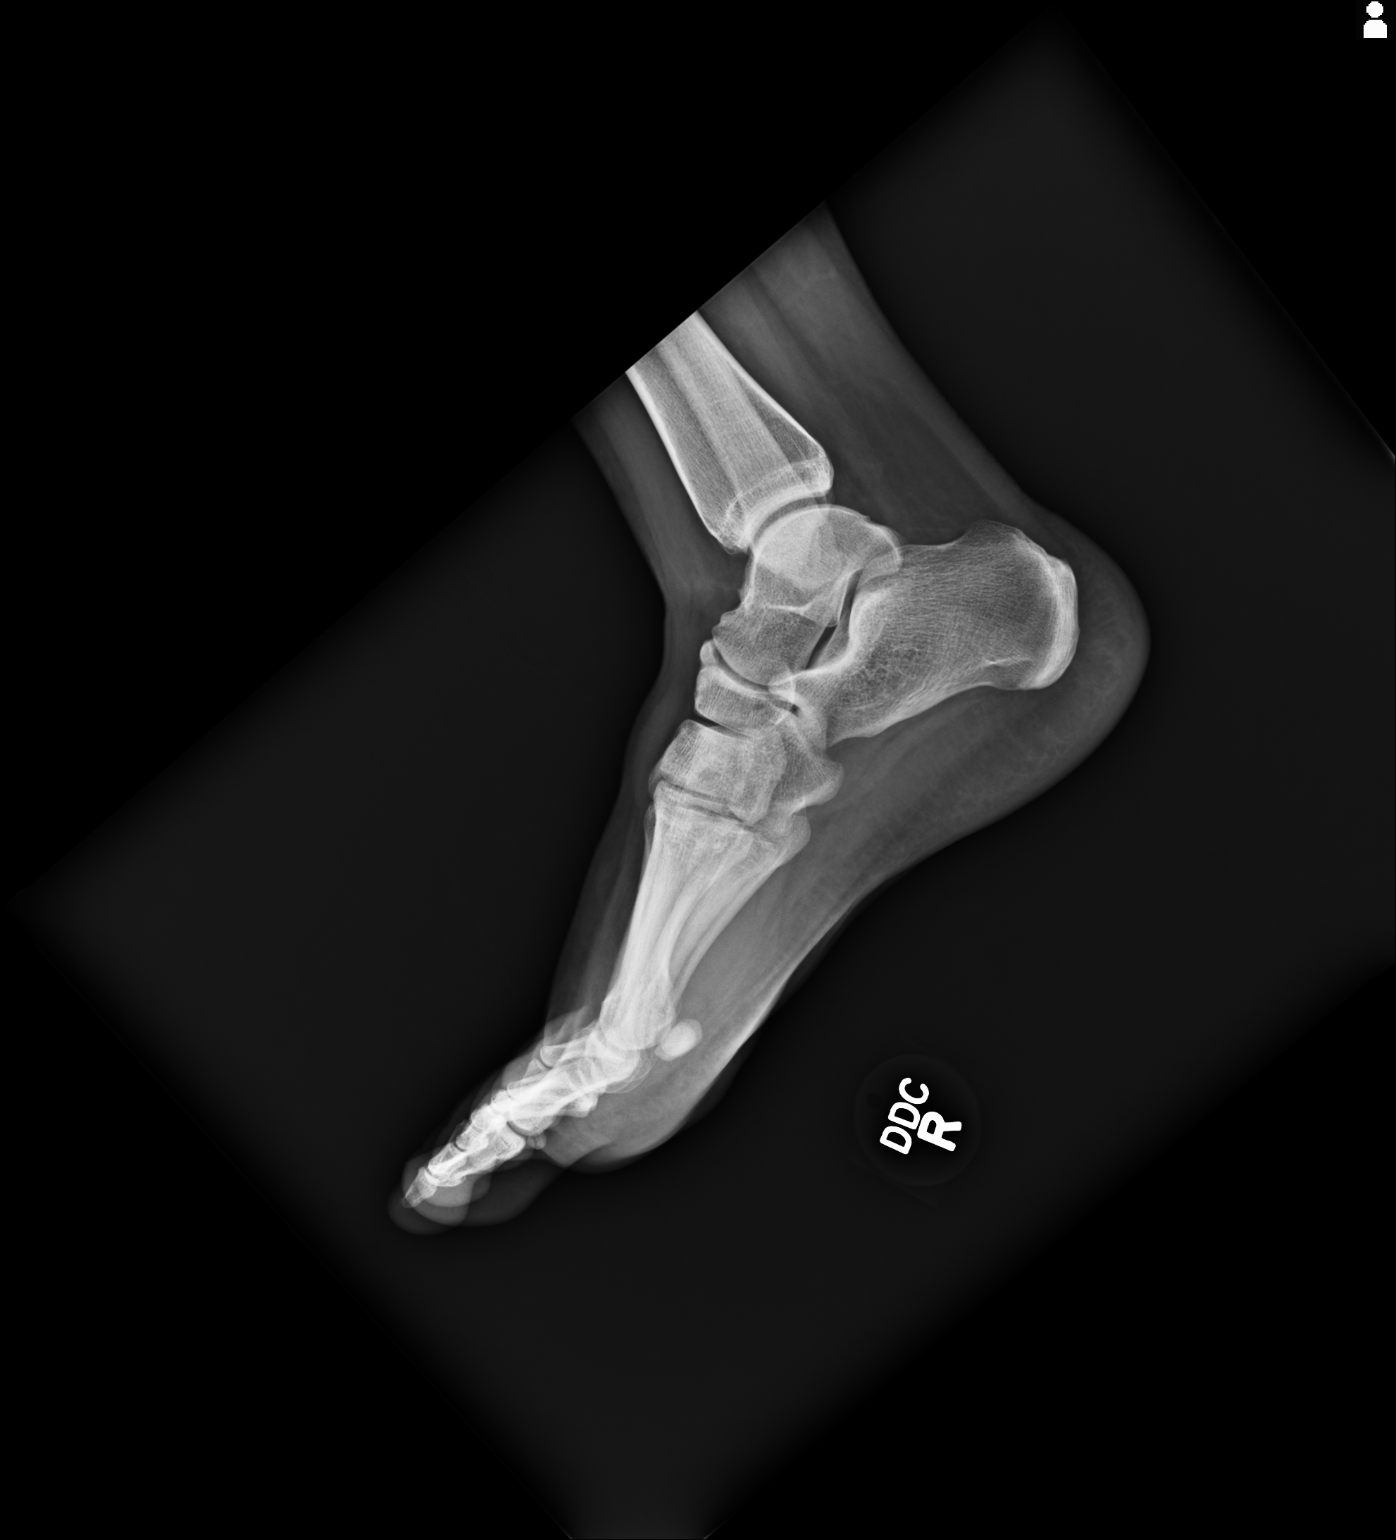

[3 of 3 positions shown; findings below may reference images not displayed]

FINDINGS: There is no evidence of fracture or dislocation. There is no
evidence of arthropathy or other focal bone abnormality. Soft
tissues are unremarkable.
IMPRESSION: Negative.

## 2016-10-01 ENCOUNTER — Ambulatory Visit: Payer: 59 | Admitting: Obstetrics and Gynecology

## 2016-11-15 ENCOUNTER — Inpatient Hospital Stay (HOSPITAL_COMMUNITY): Payer: 59

## 2016-11-15 ENCOUNTER — Inpatient Hospital Stay (HOSPITAL_COMMUNITY)
Admission: AD | Admit: 2016-11-15 | Discharge: 2016-11-15 | Disposition: A | Discharge status: Home / Self Care | Payer: 59 | Source: Ambulatory Visit | Attending: Obstetrics & Gynecology | Admitting: Obstetrics & Gynecology

## 2016-11-15 ENCOUNTER — Encounter (HOSPITAL_COMMUNITY): Payer: Self-pay | Admitting: *Deleted

## 2016-11-15 DIAGNOSIS — E282 Polycystic ovarian syndrome: Secondary | ICD-10-CM | POA: Insufficient documentation

## 2016-11-15 DIAGNOSIS — Z833 Family history of diabetes mellitus: Secondary | ICD-10-CM | POA: Insufficient documentation

## 2016-11-15 DIAGNOSIS — R1031 Right lower quadrant pain: Secondary | ICD-10-CM | POA: Insufficient documentation

## 2016-11-15 DIAGNOSIS — N83201 Unspecified ovarian cyst, right side: Secondary | ICD-10-CM

## 2016-11-15 DIAGNOSIS — R109 Unspecified abdominal pain: Secondary | ICD-10-CM | POA: Diagnosis not present

## 2016-11-15 DIAGNOSIS — R1111 Vomiting without nausea: Secondary | ICD-10-CM

## 2016-11-15 DIAGNOSIS — R110 Nausea: Secondary | ICD-10-CM | POA: Insufficient documentation

## 2016-11-15 DIAGNOSIS — Z888 Allergy status to other drugs, medicaments and biological substances status: Secondary | ICD-10-CM | POA: Insufficient documentation

## 2016-11-15 DIAGNOSIS — Z87891 Personal history of nicotine dependence: Secondary | ICD-10-CM | POA: Insufficient documentation

## 2016-11-15 DIAGNOSIS — F419 Anxiety disorder, unspecified: Secondary | ICD-10-CM | POA: Diagnosis not present

## 2016-11-15 DIAGNOSIS — Z818 Family history of other mental and behavioral disorders: Secondary | ICD-10-CM | POA: Insufficient documentation

## 2016-11-15 DIAGNOSIS — Z79899 Other long term (current) drug therapy: Secondary | ICD-10-CM | POA: Insufficient documentation

## 2016-11-15 DIAGNOSIS — Z9889 Other specified postprocedural states: Secondary | ICD-10-CM | POA: Diagnosis not present

## 2016-11-15 DIAGNOSIS — J45909 Unspecified asthma, uncomplicated: Secondary | ICD-10-CM | POA: Diagnosis not present

## 2016-11-15 DIAGNOSIS — F329 Major depressive disorder, single episode, unspecified: Secondary | ICD-10-CM | POA: Insufficient documentation

## 2016-11-15 DIAGNOSIS — Z803 Family history of malignant neoplasm of breast: Secondary | ICD-10-CM | POA: Insufficient documentation

## 2016-11-15 DIAGNOSIS — Z8041 Family history of malignant neoplasm of ovary: Secondary | ICD-10-CM | POA: Diagnosis not present

## 2016-11-15 DIAGNOSIS — Z800 Family history of malignant neoplasm of digestive organs: Secondary | ICD-10-CM | POA: Insufficient documentation

## 2016-11-15 HISTORY — DX: Unspecified abnormal cytological findings in specimens from vagina: R87.629

## 2016-11-15 HISTORY — DX: Headache: R51

## 2016-11-15 HISTORY — DX: Headache, unspecified: R51.9

## 2016-11-15 HISTORY — DX: Polycystic ovarian syndrome: E28.2

## 2016-11-15 HISTORY — DX: Other fracture of unspecified lower leg, initial encounter for closed fracture: S82.899A

## 2016-11-15 LAB — URINALYSIS, ROUTINE W REFLEX MICROSCOPIC
BILIRUBIN URINE: NEGATIVE
Glucose, UA: NEGATIVE mg/dL
HGB URINE DIPSTICK: NEGATIVE
KETONES UR: NEGATIVE mg/dL
Leukocytes, UA: NEGATIVE
NITRITE: NEGATIVE
Protein, ur: NEGATIVE mg/dL
SPECIFIC GRAVITY, URINE: 1.015 (ref 1.005–1.030)
pH: 6 (ref 5.0–8.0)

## 2016-11-15 LAB — POCT PREGNANCY, URINE: Preg Test, Ur: NEGATIVE

## 2016-11-15 MED ORDER — PROMETHAZINE HCL 12.5 MG PO TABS: 12.5000 mg | ORAL_TABLET | Freq: Four times a day (QID) | ORAL | 0 refills | Status: DC | PRN

## 2016-11-15 MED ORDER — KETOROLAC TROMETHAMINE 60 MG/2ML IM SOLN
60.0000 mg | Freq: Once | INTRAMUSCULAR | Status: AC
  Administered 2016-11-15: 60 mg via INTRAMUSCULAR
  Filled 2016-11-15: qty 2

## 2016-11-15 MED ORDER — IBUPROFEN 600 MG PO TABS: 600.0000 mg | ORAL_TABLET | Freq: Four times a day (QID) | ORAL | 0 refills | Status: DC | PRN

## 2016-11-15 MED ORDER — TRAMADOL HCL 50 MG PO TABS: 50.0000 mg | ORAL_TABLET | Freq: Four times a day (QID) | ORAL | 0 refills | Status: DC | PRN

## 2016-11-15 NOTE — MAU Provider Note (Signed)
History     CSN: [id number]  Arrival date and time: 11/15/16 1131   First Provider Initiated Contact with Patient 11/15/16 1211      Chief Complaint  Patient presents with  . Abdominal Pain   HPI Malley Hauter 20 y.o.  Comes to MAU with increasing RLQ pain.  Had an ovarian cyst identified on ultrasound in October 2017.  The past week the pain has been worse.  Taking ibuprofen or tramadol and at work her last shift she took part of a vicodin.  Is having nausea from the pain.  OB History    Gravida Para Term Preterm AB Living   0 0 0 0 0 0   SAB TAB Ectopic Multiple Live Births   0 0 0 0        Past Medical History:  Diagnosis Date  . Ankle fracture    right  . Anxiety   . Asthma   . Back pain   . Depression   . Endometriosis   . Headache   . Ovarian cyst   . Ovarian cyst   . PCOS (polycystic ovarian syndrome)   . Vaginal Pap smear, abnormal    bx ok    Past Surgical History:  Procedure Laterality Date  . NO PAST SURGERIES    . WISDOM TOOTH EXTRACTION  2013    Family History  Problem Relation Age of Onset  . Endometriosis Mother   . Diabetes Mother   . Breast cancer Paternal Grandmother   . Cancer Paternal Grandmother   . Schizophrenia Father   . Heart disease Neg Hx   . Colon cancer Neg Hx   . Ovarian cancer Neg Hx     Social History  Substance Use Topics  . Smoking status: Former Smoker    Types: Cigarettes  . Smokeless tobacco: Never Used     Comment: age [age], quit  . Alcohol use Yes     Comment: occassionally    Allergies:  Allergies  Allergen Reactions  . Latex Rash    Prescriptions Prior to Admission  Medication Sig Dispense Refill Last Dose  . Prenatal MV-Min-FA-Omega-3 (PRENATAL GUMMIES/DHA & FA) 0.4-32.5 MG CHEW Chew 2 each by mouth daily.   11/14/2016 at Unknown time    Review of Systems  Constitutional: Negative for fever.  Gastrointestinal: Positive for abdominal pain, nausea and vomiting.  Genitourinary: Negative for  dysuria, vaginal bleeding and vaginal discharge.   Physical Exam   Blood pressure 127/75, pulse 88, temperature 98.6 F (37 C), temperature source Oral, resp. rate 18, weight 202 lb 6.4 oz (91.8 kg), last menstrual period 10/28/2016.  Physical Exam  Nursing note and vitals reviewed. Constitutional: She is oriented to person, place, and time. She appears well-developed and well-nourished.  HENT:  Head: Normocephalic.  Eyes: EOM are normal.  Neck: Neck supple.  GI: Soft. There is tenderness. There is no rebound and no guarding.  Client quite tender in RLQ.  Palpation increases her nausea and she vomited a few minutes after mild to moderate pressure with abdominal exam.  Musculoskeletal: Normal range of motion.  Neurological: She is alert and oriented to person, place, and time.  Skin: Skin is warm and dry.  Psychiatric: She has a normal mood and affect.    MAU Course  Procedures Results for orders placed or performed during the hospital encounter of 11/15/16 (from the past 24 hour(s))  Pregnancy, urine POC     Status: None   Collection Time: 11/15/16 11:54 AM  Result Value Ref Range   Preg Test, Ur NEGATIVE NEGATIVE  Urinalysis, Routine w reflex microscopic     Status: None   Collection Time: 11/15/16  1:17 PM  Result Value Ref Range   Color, Urine YELLOW YELLOW   APPearance CLEAR CLEAR   Specific Gravity, Urine 1.015 1.005 - 1.030   pH 6.0 5.0 - 8.0   Glucose, UA NEGATIVE NEGATIVE mg/dL   Hgb urine dipstick NEGATIVE NEGATIVE   Bilirubin Urine NEGATIVE NEGATIVE   Ketones, ur NEGATIVE NEGATIVE mg/dL   Protein, ur NEGATIVE NEGATIVE mg/dL   Nitrite NEGATIVE NEGATIVE   Leukocytes, UA NEGATIVE NEGATIVE    MDM Will give Toradol 60 mg IM for pain.  Reviewed ultrasound results and plan of care with Dr. Macon LargeAnyanwu.  CLINICAL DATA:  21 y/o F; increasing right lower quadrant pain for a few months.  EXAM: TRANSABDOMINAL AND TRANSVAGINAL ULTRASOUND OF PELVIS  TECHNIQUE: Both  transabdominal and transvaginal ultrasound examinations of the pelvis were performed. Transabdominal technique was performed for global imaging of the pelvis including uterus, ovaries, adnexal regions, and pelvic cul-de-sac. It was necessary to proceed with endovaginal exam following the transabdominal exam to visualize the adnexa.  COMPARISON:  07/30/2016 pelvic ultrasound. CT abdomen and pelvis dated 07/16/2015.  FINDINGS: Uterus  Measurements: 7.8 x 4.3 x 4.5 cm. No fibroids or other mass visualized.  Endometrium  Thickness: 13.3 mm.  No focal abnormality visualized.  Right ovary  Measurements: 5.8 x 4.5 x 4.4 cm. Within the right ovary there is a 4.2 x 3.5 x 3.5 cm avascular cyst with lacy internal echoes.  Left ovary  Measurements: 3.3 x 2.0 x 2.8 cm. Normal appearance/no adnexal mass.  Other findings  Small volume of simple fluid in the pelvis, likely physiologic.  IMPRESSION: 1. No acute process identified. 2. Complex avascular right ovarian cyst, slightly decreased in size from 07/30/2016. Given persistence this may represent an endometrioma or involuting hemorrhagic cyst. Yearly follow-up is recommended. This recommendation follows the consensus statement: Management of Asymptomatic Ovarian and Other Adnexal Cysts Imaged at US: Society of Radiologists in Ultrasound Consensus Conference Statement. Radiology 2010; [phone].   Assessment and Plan  Right ovarian cyst - no larger and slightly decreased in size  [place] as possible Sent [facility] in [place] to see if it is possible to work [facility]. Prescribed 15 tablets of ultram and 30 tablets of Ibuprofen.  - pain was lessened by Toradol 60 mg IM in MAU Discussed the plan of care with client and her partner - in agreement. Left ambulatory.  Terri L Burleson 11/15/2016, 2:09 PM

## 2016-11-15 NOTE — Discharge Instructions (Signed)
Get your prescriptions and take as directed to control your pain Expect a call from the [facility] [place] to discuss the plan for managing the ovarian cyst.

## 2016-11-15 NOTE — MAU Note (Signed)
Has PCOS, believes she has a cyst on her rt ovary, pain started 2 wks ago

## 2016-11-16 ENCOUNTER — Telehealth: Payer: Self-pay

## 2016-11-16 ENCOUNTER — Other Ambulatory Visit: Payer: Self-pay | Admitting: Obstetrics and Gynecology

## 2016-11-16 NOTE — Telephone Encounter (Signed)
Attempted to reach patient and unable to leave message due to mailbox full. Armandina StammerJennifer Dajanee Voorheis RNBSN

## 2016-11-16 NOTE — Telephone Encounter (Signed)
-----   Message from Currie Pariserri L Burleson, NP sent at 11/15/2016  2:53 PM EST ----- Client has a known right ovarian cyst and needs to establish care here.  Dr. Macon LargeAnyanwu says she can see a "surgeon" to talk about her cyst and make a plan for management.  Thought this location might be good for getting her in by 4 weeks.

## 2016-12-04 ENCOUNTER — Encounter: Payer: Self-pay | Admitting: Obstetrics & Gynecology

## 2016-12-04 ENCOUNTER — Ambulatory Visit (INDEPENDENT_AMBULATORY_CARE_PROVIDER_SITE_OTHER): Payer: 59 | Admitting: Obstetrics & Gynecology

## 2016-12-04 VITALS — BP 124/78 | HR 77 | Ht 60.5 in | Wt 202.0 lb

## 2016-12-04 DIAGNOSIS — N83201 Unspecified ovarian cyst, right side: Secondary | ICD-10-CM | POA: Diagnosis not present

## 2016-12-04 DIAGNOSIS — N946 Dysmenorrhea, unspecified: Secondary | ICD-10-CM | POA: Diagnosis not present

## 2016-12-04 MED ORDER — TRAMADOL HCL 50 MG PO TABS: 50.0000 mg | ORAL_TABLET | Freq: Four times a day (QID) | ORAL | 0 refills | Status: DC | PRN

## 2016-12-04 MED ORDER — DICLOFENAC POTASSIUM 50 MG PO TABS: 50.0000 mg | ORAL_TABLET | Freq: Three times a day (TID) | ORAL | 1 refills | Status: DC

## 2016-12-04 NOTE — Progress Notes (Signed)
History:  21 y.o. G0P0000 here today for eval of pelvic pain. Pt reports severe pain in Oct.  She has a h/o endometriosis and thout that it was from that. Pt was dx'd with an ov cyst. She did not have a f/u due to ins issues.  She is here for a f/u.   Pt reports daily pain at present.  She is taking Motrin.  It helps.  Pt works as a IT consultantwaitress and a babysitter.  It limits her work but, she is able to go.  The 1st week of her cycle is the worst. Pt reports a h/o pain with her cycles since menarche.  Pt reports monthly cycles with 3 days of heavy bleeding and 3-4 day of light bleeding.    Pt was prev on OCPs. Stopped mid July.  On the OCPs the bleeding was not as painful but, they were long. Only occ bad ones.   Pt is interested in conceiving.  Pt was prev told that she had endometriosis.    The following portions of the patient's history were reviewed and updated as appropriate: allergies, current medications, past family history, past medical history, past social history, past surgical history and problem list.  Review of Systems:  Pertinent items are noted in HPI.   Objective:  Physical Exam Blood pressure 124/78, pulse 77, height 5' 0.5" (1.537 m), weight 202 lb (91.6 kg), last menstrual period 11/21/2016. Gen: NAD Abd: Soft, nontender and nondistended Pelvic: Normal appearing external genitalia; normal appearing vaginal mucosa and cervix.  Normal discharge.  Small uterus, no other palpable masses, no uterine or adnexal tenderness  Labs and Imaging [place] Transvaginal Non-ob  Result Date: 11/15/2016 CLINICAL DATA:  21 y/o F; increasing right lower quadrant pain for a few months. EXAM: TRANSABDOMINAL AND TRANSVAGINAL ULTRASOUND OF PELVIS TECHNIQUE: Both transabdominal and transvaginal ultrasound examinations of the pelvis were performed. Transabdominal technique was performed for global imaging of the pelvis including uterus, ovaries, adnexal regions, and pelvic cul-de-sac. It was necessary to  proceed with endovaginal exam following the transabdominal exam to visualize the adnexa. COMPARISON:  07/30/2016 pelvic ultrasound. CT abdomen and pelvis dated 07/16/2015. FINDINGS: Uterus Measurements: 7.8 x 4.3 x 4.5 cm. No fibroids or other mass visualized. Endometrium Thickness: 13.3 mm.  No focal abnormality visualized. Right ovary Measurements: 5.8 x 4.5 x 4.4 cm. Within the right ovary there is a 4.2 x 3.5 x 3.5 cm avascular cyst with lacy internal echoes. Left ovary Measurements: 3.3 x 2.0 x 2.8 cm. Normal appearance/no adnexal mass. Other findings Small volume of simple fluid in the pelvis, likely physiologic. IMPRESSION: 1. No acute process identified. 2. Complex avascular right ovarian cyst, slightly decreased in size from 07/30/2016. Given persistence this may represent an endometrioma or involuting hemorrhagic cyst. Yearly follow-up is recommended. This recommendation follows the consensus statement: Management of Asymptomatic Ovarian and Other Adnexal Cysts Imaged at US: Society of Radiologists in Ultrasound Consensus Conference Statement. Radiology 2010; [phone]. Electronically Signed   By: Mitzi HansenLance  Furusawa-Stratton M.D.   On: 11/15/2016 13:49   [place] Pelvis Complete  Result Date: 11/15/2016 CLINICAL DATA:  21 y/o F; increasing right lower quadrant pain for a few months. EXAM: TRANSABDOMINAL AND TRANSVAGINAL ULTRASOUND OF PELVIS TECHNIQUE: Both transabdominal and transvaginal ultrasound examinations of the pelvis were performed. Transabdominal technique was performed for global imaging of the pelvis including uterus, ovaries, adnexal regions, and pelvic cul-de-sac. It was necessary to proceed with endovaginal exam following the transabdominal exam to visualize the adnexa. COMPARISON:  07/30/2016 pelvic  ultrasound. CT abdomen and pelvis dated 07/16/2015. FINDINGS: Uterus Measurements: 7.8 x 4.3 x 4.5 cm. No fibroids or other mass visualized. Endometrium Thickness: 13.3 mm.  No focal abnormality  visualized. Right ovary Measurements: 5.8 x 4.5 x 4.4 cm. Within the right ovary there is a 4.2 x 3.5 x 3.5 cm avascular cyst with lacy internal echoes. Left ovary Measurements: 3.3 x 2.0 x 2.8 cm. Normal appearance/no adnexal mass. Other findings Small volume of simple fluid in the pelvis, likely physiologic. IMPRESSION: 1. No acute process identified. 2. Complex avascular right ovarian cyst, slightly decreased in size from 07/30/2016. Given persistence this may represent an endometrioma or involuting hemorrhagic cyst. Yearly follow-up is recommended. This recommendation follows the consensus statement: Management of Asymptomatic Ovarian and Other Adnexal Cysts Imaged at [place]: Society of Radiologists in Ultrasound Consensus Conference Statement. Radiology 2010; [phone]. Electronically Signed   By: Mitzi Hansen M.D.   On: 11/15/2016 13:49    Assessment & Plan:  Pelvic pain chronic persistent ov cyst- possibly an endometrioma Pt desires pregnancy  Cataflam 50mg  1 pi tid prn Ultram 50mg  po q 6hours prn Repeat US in 1 year or sooner prn  Verdis Koval L. Harraway-Smith, M.D., [place]

## 2016-12-06 ENCOUNTER — Encounter: Payer: Self-pay | Admitting: Obstetrics & Gynecology

## 2017-02-05 ENCOUNTER — Other Ambulatory Visit: Payer: Self-pay | Admitting: Obstetrics and Gynecology

## 2017-02-09 NOTE — Patient Instructions (Signed)
Your procedure is scheduled on:  Thursday, Feb 18, 2017  Enter through the [employer] [facility] at:  10:00 AM  Pick up the phone at the desk and dial [phone].  Call this number if you have problems the morning of surgery: [phone].  Remember: Do NOT eat food or drink after:  Midnight Wednesday  Take these medicines the morning of surgery with a SIP OF WATER:  None  Stop ALL herbal medications at this time  Do NOT smoke the day of surgery.  Do NOT wear jewelry (body piercing), metal hair clips/bobby pins, make-up, or nail polish. Do NOT wear lotions, powders, or perfumes.  You may wear deodorant. Do NOT shave for 48 hours prior to surgery. Do NOT bring valuables to the hospital. Contacts, dentures, or bridgework may not be worn into surgery.  Have a responsible adult drive you home and stay with you for 24 hours after your procedure  Bring a copy of your healthcare power of attorney and living will documents.

## 2017-02-10 ENCOUNTER — Encounter (HOSPITAL_COMMUNITY): Payer: Self-pay

## 2017-02-10 ENCOUNTER — Encounter (HOSPITAL_COMMUNITY)
Admission: RE | Admit: 2017-02-10 | Discharge: 2017-02-10 | Disposition: A | Discharge status: Home / Self Care | Payer: 59 | Source: Ambulatory Visit | Attending: Obstetrics and Gynecology | Admitting: Obstetrics and Gynecology

## 2017-02-10 DIAGNOSIS — Z01812 Encounter for preprocedural laboratory examination: Secondary | ICD-10-CM | POA: Diagnosis not present

## 2017-02-10 HISTORY — DX: Gastro-esophageal reflux disease without esophagitis: K21.9

## 2017-02-10 HISTORY — DX: Pneumonia, unspecified organism: J18.9

## 2017-02-10 HISTORY — DX: Anemia, unspecified: D64.9

## 2017-02-10 LAB — CBC
HCT: 38.1 % (ref 36.0–46.0)
HEMOGLOBIN: 12.3 g/dL (ref 12.0–15.0)
MCH: 25.1 pg — ABNORMAL LOW (ref 26.0–34.0)
MCHC: 32.3 g/dL (ref 30.0–36.0)
MCV: 77.6 fL — ABNORMAL LOW (ref 78.0–100.0)
Platelets: 388 10*3/uL (ref 150–400)
RBC: 4.91 MIL/uL (ref 3.87–5.11)
RDW: 16.1 % — ABNORMAL HIGH (ref 11.5–15.5)
WBC: 8.7 10*3/uL (ref 4.0–10.5)

## 2017-02-10 LAB — BASIC METABOLIC PANEL
ANION GAP: 7 (ref 5–15)
BUN: 10 mg/dL (ref 6–20)
CALCIUM: 9.1 mg/dL (ref 8.9–10.3)
CO2: 26 mmol/L (ref 22–32)
Chloride: 103 mmol/L (ref 101–111)
Creatinine, Ser: 0.53 mg/dL (ref 0.44–1.00)
GFR calc Af Amer: >60 mL/min (ref >60)
GLUCOSE: 99 mg/dL (ref 65–99)
Potassium: 3.9 mmol/L (ref 3.5–5.1)
Sodium: 136 mmol/L (ref 135–145)

## 2017-02-17 NOTE — H&P (Signed)
NAMEElita Rich NO.:  [id number]  MEDICAL RECORD NO.:  [id number]  LOCATION:                                 FACILITY:  PHYSICIAN:  Lenoard Aden, M.D.     DATE OF BIRTH:  DATE OF ADMISSION: DATE OF DISCHARGE:                             HISTORY & PHYSICAL   CHIEF COMPLAINT:  Pelvic pain, dysmenorrhea, and right lower quadrant pain.  HISTORY OF PRESENT ILLNESS:  She is a 21 year old white female, G0, P0, with a symptomatic 4 cm ovarian cyst consistent with possible endometrioma, who presents with increasing pain for definitive therapy.  FAMILY HISTORY:  She has a family history of diabetes and breast cancer.  MEDICATIONS:  Tramadol p.r.n., Phenergan, and Xanax as needed.  SOCIAL HISTORY:  Nonsmoker, nondrinker.  Denies domestic or physical violence.  No medical or surgical hospitalizations.  PHYSICAL EXAMINATION:  GENERAL:  She is a well-developed, well- nourished, white female in no acute distress. HEENT:  Normal. NECK:  Supple.  Full range of motion. LUNGS:  Clear. HEART:  Regular rate and rhythm. ABDOMEN:  Soft, nontender. PELVIC:  Normal size uterus.  Right adnexal fullness and tenderness.  No obvious adnexal masses are palpated. EXTREMITIES:  No cords. NEUROLOGIC:  Nonfocal. SKIN:  Intact.  IMPRESSION:  Right lower quadrant pain with secondary dysmenorrhea and questionable ovarian cyst consistent with possible endometrioma.  PLAN:  Proceed with da Vinci assisted laparoscopic excision, ablation of endometriosis, possible right ovarian cystectomy, possible lysis of adhesions.  Risks of anesthesia, infection, bleeding, injury to surrounding organs, possible need for repair discussed.  Delayed versus immediate complications to include bowel and bladder injury are noted. The patient acknowledges and wishes to proceed.     Lenoard Aden, M.D.     RJT/MEDQ  D:  02/17/2017  T:  02/17/2017  Job:  [id number]

## 2017-02-18 ENCOUNTER — Ambulatory Visit (HOSPITAL_COMMUNITY)
Admission: RE | Admit: 2017-02-18 | Discharge: 2017-02-18 | Disposition: A | Discharge status: Home / Self Care | Payer: 59 | Source: Ambulatory Visit | Attending: Obstetrics and Gynecology | Admitting: Obstetrics and Gynecology

## 2017-02-18 ENCOUNTER — Encounter (HOSPITAL_COMMUNITY)
Admission: RE | Disposition: A | Discharge status: Home / Self Care | Payer: Self-pay | Source: Ambulatory Visit | Attending: Obstetrics and Gynecology

## 2017-02-18 ENCOUNTER — Ambulatory Visit (HOSPITAL_COMMUNITY): Payer: 59 | Admitting: Anesthesiology

## 2017-02-18 ENCOUNTER — Encounter (HOSPITAL_COMMUNITY): Payer: Self-pay | Admitting: *Deleted

## 2017-02-18 DIAGNOSIS — N83201 Unspecified ovarian cyst, right side: Secondary | ICD-10-CM | POA: Diagnosis present

## 2017-02-18 DIAGNOSIS — N803 Endometriosis of pelvic peritoneum: Secondary | ICD-10-CM | POA: Diagnosis not present

## 2017-02-18 DIAGNOSIS — Z87891 Personal history of nicotine dependence: Secondary | ICD-10-CM | POA: Insufficient documentation

## 2017-02-18 DIAGNOSIS — N946 Dysmenorrhea, unspecified: Secondary | ICD-10-CM | POA: Insufficient documentation

## 2017-02-18 DIAGNOSIS — Z6838 Body mass index (BMI) 38.0-38.9, adult: Secondary | ICD-10-CM | POA: Diagnosis not present

## 2017-02-18 HISTORY — DX: Other specified postprocedural states: R11.2

## 2017-02-18 HISTORY — DX: Other specified postprocedural states: Z98.890

## 2017-02-18 HISTORY — PX: ROBOTIC ASSISTED DIAGNOSTIC LAPAROSCOPY: SHX6532

## 2017-02-18 LAB — HCG, SERUM, QUALITATIVE: PREG SERUM: NEGATIVE

## 2017-02-18 SURGERY — ROBOTIC ASSISTED DIAGNOSTIC LAPAROSCOPY
Anesthesia: General | Site: Abdomen

## 2017-02-18 MED ORDER — ACETAMINOPHEN 160 MG/5ML PO SOLN: 975.0000 mg | Freq: Once | ORAL | Status: DC

## 2017-02-18 MED ORDER — MIDAZOLAM HCL 2 MG/2ML IJ SOLN
INTRAMUSCULAR | Status: DC | PRN
  Administered 2017-02-18: 2 mg via INTRAVENOUS

## 2017-02-18 MED ORDER — NEOSTIGMINE METHYLSULFATE 10 MG/10ML IV SOLN
INTRAVENOUS | Status: AC
  Filled 2017-02-18: qty 1

## 2017-02-18 MED ORDER — OXYCODONE-ACETAMINOPHEN 5-325 MG PO TABS: 1.0000 | ORAL_TABLET | ORAL | 0 refills | Status: DC | PRN

## 2017-02-18 MED ORDER — DEXAMETHASONE SODIUM PHOSPHATE 10 MG/ML IJ SOLN
INTRAMUSCULAR | Status: DC | PRN
  Administered 2017-02-18: 4 mg via INTRAVENOUS

## 2017-02-18 MED ORDER — ROCURONIUM BROMIDE 100 MG/10ML IV SOLN
INTRAVENOUS | Status: DC | PRN
  Administered 2017-02-18: 40 mg via INTRAVENOUS
  Administered 2017-02-18: 10 mg via INTRAVENOUS

## 2017-02-18 MED ORDER — GLYCOPYRROLATE 0.2 MG/ML IJ SOLN
INTRAMUSCULAR | Status: DC | PRN
  Administered 2017-02-18: .5 mg via INTRAVENOUS

## 2017-02-18 MED ORDER — LIDOCAINE HCL (CARDIAC) 20 MG/ML IV SOLN
INTRAVENOUS | Status: AC
  Filled 2017-02-18: qty 5

## 2017-02-18 MED ORDER — PROMETHAZINE HCL 25 MG/ML IJ SOLN
6.2500 mg | INTRAMUSCULAR | Status: AC | PRN
  Administered 2017-02-18 (×2): 6.25 mg via INTRAVENOUS

## 2017-02-18 MED ORDER — GLYCOPYRROLATE 0.2 MG/ML IJ SOLN
INTRAMUSCULAR | Status: AC
  Filled 2017-02-18: qty 1

## 2017-02-18 MED ORDER — FENTANYL CITRATE (PF) 100 MCG/2ML IJ SOLN
INTRAMUSCULAR | Status: AC
  Filled 2017-02-18: qty 2

## 2017-02-18 MED ORDER — KETOROLAC TROMETHAMINE 30 MG/ML IJ SOLN
INTRAMUSCULAR | Status: DC | PRN
  Administered 2017-02-18: 30 mg via INTRAVENOUS

## 2017-02-18 MED ORDER — NEOSTIGMINE METHYLSULFATE 10 MG/10ML IV SOLN
INTRAVENOUS | Status: DC | PRN
  Administered 2017-02-18: 2.5 mg via INTRAVENOUS

## 2017-02-18 MED ORDER — PROPOFOL 10 MG/ML IV BOLUS
INTRAVENOUS | Status: AC
  Filled 2017-02-18: qty 40

## 2017-02-18 MED ORDER — CEFAZOLIN SODIUM-DEXTROSE 2-4 GM/100ML-% IV SOLN
2.0000 g | INTRAVENOUS | Status: AC
  Administered 2017-02-18: 2 g via INTRAVENOUS

## 2017-02-18 MED ORDER — ONDANSETRON HCL 4 MG/2ML IJ SOLN
INTRAMUSCULAR | Status: AC
  Filled 2017-02-18: qty 2

## 2017-02-18 MED ORDER — BUPIVACAINE HCL (PF) 0.25 % IJ SOLN
INTRAMUSCULAR | Status: AC
  Filled 2017-02-18: qty 30

## 2017-02-18 MED ORDER — FENTANYL CITRATE (PF) 100 MCG/2ML IJ SOLN
25.0000 ug | INTRAMUSCULAR | Status: DC | PRN
  Administered 2017-02-18 (×2): 25 ug via INTRAVENOUS

## 2017-02-18 MED ORDER — LIDOCAINE HCL (CARDIAC) 20 MG/ML IV SOLN
INTRAVENOUS | Status: DC | PRN
  Administered 2017-02-18: 80 mg via INTRAVENOUS

## 2017-02-18 MED ORDER — SCOPOLAMINE 1 MG/3DAYS TD PT72
1.0000 | MEDICATED_PATCH | Freq: Once | TRANSDERMAL | Status: DC
  Administered 2017-02-18: 1.5 mg via TRANSDERMAL

## 2017-02-18 MED ORDER — SODIUM CHLORIDE 0.9 % IJ SOLN
INTRAMUSCULAR | Status: AC
  Filled 2017-02-18: qty 50

## 2017-02-18 MED ORDER — ROCURONIUM BROMIDE 100 MG/10ML IV SOLN
INTRAVENOUS | Status: AC
  Filled 2017-02-18: qty 1

## 2017-02-18 MED ORDER — PROPOFOL 10 MG/ML IV BOLUS
INTRAVENOUS | Status: DC | PRN
  Administered 2017-02-18: 200 mg via INTRAVENOUS

## 2017-02-18 MED ORDER — FENTANYL CITRATE (PF) 100 MCG/2ML IJ SOLN
INTRAMUSCULAR | Status: DC | PRN
  Administered 2017-02-18: 100 ug via INTRAVENOUS
  Administered 2017-02-18 (×2): 50 ug via INTRAVENOUS
  Administered 2017-02-18: 100 ug via INTRAVENOUS
  Administered 2017-02-18: 50 ug via INTRAVENOUS

## 2017-02-18 MED ORDER — ONDANSETRON HCL 4 MG/2ML IJ SOLN
4.0000 mg | Freq: Once | INTRAMUSCULAR | Status: AC
  Administered 2017-02-18: 4 mg via INTRAVENOUS

## 2017-02-18 MED ORDER — LACTATED RINGERS IV SOLN
INTRAVENOUS | Status: DC
  Administered 2017-02-18: 1000 mL via INTRAVENOUS
  Administered 2017-02-18 (×2): via INTRAVENOUS
  Administered 2017-02-18: 1000 mL via INTRAVENOUS

## 2017-02-18 MED ORDER — PROMETHAZINE HCL 25 MG/ML IJ SOLN
INTRAMUSCULAR | Status: AC
  Administered 2017-02-18: 6.25 mg via INTRAVENOUS
  Filled 2017-02-18: qty 1

## 2017-02-18 MED ORDER — ONDANSETRON HCL 4 MG/2ML IJ SOLN
INTRAMUSCULAR | Status: DC | PRN
  Administered 2017-02-18: 4 mg via INTRAVENOUS

## 2017-02-18 MED ORDER — DEXAMETHASONE SODIUM PHOSPHATE 10 MG/ML IJ SOLN
INTRAMUSCULAR | Status: AC
  Filled 2017-02-18: qty 1

## 2017-02-18 MED ORDER — FENTANYL CITRATE (PF) 250 MCG/5ML IJ SOLN
INTRAMUSCULAR | Status: AC
  Filled 2017-02-18: qty 5

## 2017-02-18 MED ORDER — FENTANYL CITRATE (PF) 100 MCG/2ML IJ SOLN
INTRAMUSCULAR | Status: AC
  Administered 2017-02-18: 25 ug via INTRAVENOUS
  Filled 2017-02-18: qty 2

## 2017-02-18 MED ORDER — MIDAZOLAM HCL 2 MG/2ML IJ SOLN
INTRAMUSCULAR | Status: AC
  Filled 2017-02-18: qty 2

## 2017-02-18 MED ORDER — SCOPOLAMINE 1 MG/3DAYS TD PT72
MEDICATED_PATCH | TRANSDERMAL | Status: AC
  Administered 2017-02-18: 1.5 mg via TRANSDERMAL
  Filled 2017-02-18: qty 1

## 2017-02-18 MED ORDER — PROMETHAZINE HCL 25 MG/ML IJ SOLN
12.5000 mg | Freq: Four times a day (QID) | INTRAMUSCULAR | Status: DC | PRN
  Administered 2017-02-18: 12.5 mg via INTRAVENOUS

## 2017-02-18 MED ORDER — BUPIVACAINE HCL (PF) 0.25 % IJ SOLN
INTRAMUSCULAR | Status: DC | PRN
  Administered 2017-02-18: 6 mL

## 2017-02-18 MED ORDER — ROPIVACAINE HCL 5 MG/ML IJ SOLN
INTRAMUSCULAR | Status: AC
  Filled 2017-02-18: qty 30

## 2017-02-18 MED ORDER — DEXAMETHASONE SODIUM PHOSPHATE 4 MG/ML IJ SOLN
4.0000 mg | Freq: Once | INTRAMUSCULAR | Status: AC
  Administered 2017-02-18: 4 mg via INTRAVENOUS

## 2017-02-18 SURGICAL SUPPLY — 53 items
BARRIER ADHS 3X4 INTERCEED (GAUZE/BANDAGES/DRESSINGS) ×2 IMPLANT
CATH FOLEY 3WAY  5CC 16FR (CATHETERS)
CATH FOLEY 3WAY 5CC 16FR (CATHETERS) IMPLANT
CATH FOLEY LATEX FREE 14FR (CATHETERS) ×1
CATH FOLEY LF 14FR (CATHETERS) ×1 IMPLANT
CLOTH BEACON ORANGE TIMEOUT ST (SAFETY) ×2 IMPLANT
CONT PATH 16OZ SNAP LID 3702 (MISCELLANEOUS) ×2 IMPLANT
COVER BACK TABLE 60X90IN (DRAPES) ×4 IMPLANT
COVER TIP SHEARS 8 DVNC (MISCELLANEOUS) ×1 IMPLANT
COVER TIP SHEARS 8MM DA VINCI (MISCELLANEOUS) ×1
DECANTER SPIKE VIAL GLASS SM (MISCELLANEOUS) ×6 IMPLANT
DEFOGGER SCOPE WARMER CLEARIFY (MISCELLANEOUS) ×2 IMPLANT
DERMABOND ADVANCED (GAUZE/BANDAGES/DRESSINGS) ×1
DERMABOND ADVANCED .7 DNX12 (GAUZE/BANDAGES/DRESSINGS) ×1 IMPLANT
DRSG OPSITE POSTOP 3X4 (GAUZE/BANDAGES/DRESSINGS) ×2 IMPLANT
DURAPREP 26ML APPLICATOR (WOUND CARE) ×2 IMPLANT
ELECT REM PT RETURN 9FT ADLT (ELECTROSURGICAL) ×2
ELECTRODE REM PT RTRN 9FT ADLT (ELECTROSURGICAL) ×1 IMPLANT
GAUZE VASELINE 3X9 (GAUZE/BANDAGES/DRESSINGS) IMPLANT
GLOVE BIO SURGEON STRL SZ7.5 (GLOVE) ×6 IMPLANT
GLOVE BIOGEL PI IND STRL 7.0 (GLOVE) ×2 IMPLANT
GLOVE BIOGEL PI INDICATOR 7.0 (GLOVE) ×2
KIT ACCESSORY DA VINCI DISP (KITS) ×1
KIT ACCESSORY DVNC DISP (KITS) ×1 IMPLANT
LEGGING LITHOTOMY PAIR STRL (DRAPES) ×2 IMPLANT
NEEDLE INSUFFLATION 150MM (ENDOMECHANICALS) ×2 IMPLANT
PACK ROBOT WH (CUSTOM PROCEDURE TRAY) ×2 IMPLANT
PACK ROBOTIC GOWN (GOWN DISPOSABLE) ×2 IMPLANT
PACK TRENDGUARD 450 HYBRID PRO (MISCELLANEOUS) ×1 IMPLANT
PACK TRENDGUARD 600 HYBRD PROC (MISCELLANEOUS) IMPLANT
PAD PREP 24X48 CUFFED NSTRL (MISCELLANEOUS) ×2 IMPLANT
POUCH LAPAROSCOPIC INSTRUMENT (MISCELLANEOUS) ×2 IMPLANT
PROTECTOR NERVE ULNAR (MISCELLANEOUS) ×4 IMPLANT
SET CYSTO W/LG BORE CLAMP LF (SET/KITS/TRAYS/PACK) IMPLANT
SET IRRIG TUBING LAPAROSCOPIC (IRRIGATION / IRRIGATOR) ×2 IMPLANT
SET TRI-LUMEN FLTR TB AIRSEAL (TUBING) ×2 IMPLANT
SUT VIC AB 0 CT1 27 (SUTURE) ×2
SUT VIC AB 0 CT1 27XBRD ANBCTR (SUTURE) ×2 IMPLANT
SUT VICRYL 0 UR6 27IN ABS (SUTURE) ×2 IMPLANT
SUT VICRYL RAPIDE 4/0 PS 2 (SUTURE) ×4 IMPLANT
SYR 50ML LL SCALE MARK (SYRINGE) ×2 IMPLANT
SYRINGE 10CC LL (SYRINGE) ×2 IMPLANT
TIP UTERINE 5.1X6CM LAV DISP (MISCELLANEOUS) IMPLANT
TIP UTERINE 6.7X10CM GRN DISP (MISCELLANEOUS) IMPLANT
TIP UTERINE 6.7X6CM WHT DISP (MISCELLANEOUS) IMPLANT
TIP UTERINE 6.7X8CM BLUE DISP (MISCELLANEOUS) IMPLANT
TOWEL OR 17X24 6PK STRL BLUE (TOWEL DISPOSABLE) ×6 IMPLANT
TRENDGUARD 450 HYBRID PRO PACK (MISCELLANEOUS) ×2
TRENDGUARD 600 HYBRID PROC PK (MISCELLANEOUS)
TROCAR DISP BLADELESS 8 DVNC (TROCAR) ×1 IMPLANT
TROCAR DISP BLADELESS 8MM (TROCAR) ×1
TROCAR PORT AIRSEAL 5X120 (TROCAR) ×2 IMPLANT
TROCAR Z-THREAD 12X150 (TROCAR) ×2 IMPLANT

## 2017-02-18 NOTE — Op Note (Signed)
NAMElita Rich:  ,                            ACCOUNT NO.:  [id number]  MEDICAL RECORD NO.:  [id number]  LOCATION:                                 FACILITY:  PHYSICIAN:  Lenoard Adenichard J. Reyann Troop, M.D.     DATE OF BIRTH:  DATE OF PROCEDURE: DATE OF DISCHARGE:                              OPERATIVE REPORT   PREOPERATIVE DIAGNOSES:  Pelvic pain, right lower quadrant pain, dysmenorrhea.  POSTOPERATIVE DIAGNOSES:  Pelvic endometriosis, right endometrioma, pelvic adhesions, superficial and deep endometriosis in anterior and posterior cul-de-sac.  PROCEDURE:  Da Vinci assisted operative laparoscopic lysis of adhesions of sigmoid colon to the left adnexa, left ovary to the left ovarian fossa, right ovary to right ovarian fossa, and posterior uterine wall into the cul-de-sac.  Excision of cul-de-sac endometriosis Freida Busman(Allen- Masters window).  Excision of right endometrioma.  Excision of anterior cul-de-sac and posterior cul-de-sac endometriosis.  Ablation of endometriosis in the posterior cul-de-sac and anterior cul-de-sac.  SURGEON:  Lenoard Adenichard J. Rajanae Mantia, M.D.  ASSISTANT:  Marlinda Mikeanya Bailey, C.N.M.  ESTIMATED BLOOD LOSS:  Less than 50 mL.  COMPLICATIONS:  None.  DRAINS:  Foley.  COUNTS:  Correct.  DISPOSITION:  The patient to recovery in good condition.  BRIEF OPERATIVE NOTE:  After being apprised of risks of anesthesia, infection, bleeding, injury to surrounding organs, possible need for repair, delayed versus immediate complications to include bowel and bladder injury, possible need for repair, the patient was brought to the operating room, she was administered general anesthetic without complications.  Prepped and draped in usual sterile fashion.  Foley catheter placed.  Exam under anesthesia revealed an anteflexed uterus and no adnexal masses appreciated.  At this time, Hulka tenaculum placed vaginally without difficulty.  Infraumbilical incision made with a scalpel.  Veress needle placed, opening  pressure -2.  As noted, 3 L of CO2 insufflated without difficulty.  Trocar was placed atraumatically. Visualization revealed as noted anterior and posterior cul-de-sac endometriosis, adherent right and left ovaries into the right and left ovarian fossas.  Bilateral normal tubes.  Liver and gallbladder and diaphragmatic area appeared normal.  The appendix was also stuck adherent into the right ovarian fossa.  At this time, 2 trocar sites on the left and 1 trocar site on the right, robot docked.  Placement of PK forceps and Endo Shears without difficulty.  Air seal was established with 5 mm trocar.  At this time, the anterior cul-de-sac endometriosis was excised and ablated, specimen sent.  Posterior cul-de-sac endometriosis was excised from an Allen-Masters window posterior in between the uterosacral ligaments.  Superficial endometriosis in the cul- de-sac was ablated.  The left ovary was adherent to the left ovarian fossa.  These adhesions were lysed and left ovary was free.  Left tube appeared normal.  There was peritoneal endometriosis cephalad along the left pelvic sidewall to the left ovary which was cauterized using monopolar cautery.  On the right side, the right ovary was significantly adherent into the cul-de-sac.  There was some expression of some dark chocolaty material consistent with a right ovarian endometrioma that was adherent to and walled off against the right pelvic  sidewall.  This was excised without difficulty and the right ovarian adhesions were freed. Good hemostasis noted.  Ureters identified peristalsing normally. Appendix was also visualized and mobilized and freed from its otherwise an adherent portion into the right lower quadrant.  The appendix appeared normal.  At this time, irrigation was accomplished.  Interceed was placed in the posterior cul-de-sac and the right ovarian fossa. Good hemostasis noted.  The robot was undocked.  CO2 was released.   All instruments removed under direct visualization.  The incisions were closed using 0 Vicryl, 4-0 Vicryl, Dermabond.  All instruments removed from the vagina.  The patient tolerated the procedure well, was awakened and transferred to recovery in good condition.     Lenoard Aden, M.D.     RJT/MEDQ  D:  02/18/2017  T:  02/18/2017  Job:  [id number]

## 2017-02-18 NOTE — Progress Notes (Signed)
Patient seen and examined. Consent witnessed and signed. No changes noted. Update completed.[name], female   DOB: May 03, 1996, 21 y.o.   MRN: [id number]

## 2017-02-18 NOTE — Discharge Instructions (Signed)
DISCHARGE INSTRUCTIONS: Laparoscopy  The following instructions have been prepared to help you care for yourself upon your return home today.  Wound care:  Do not get the incision wet for the first 24 hours. The incision should be kept clean and dry.  The Band-Aids or dressings may be removed the day after surgery.  Should the incision become sore, red, and swollen after the first week, check with your doctor.  Personal hygiene:  Shower the day after your procedure.  Activity and limitations:  Do NOT drive or operate any equipment today.  Do NOT lift anything more than 15 pounds for 2-3 weeks after surgery.  Do NOT rest in bed all day.  Walking is encouraged. Walk each day, starting slowly with 5-minute walks 3 or 4 times a day. Slowly increase the length of your walks.  Walk up and down stairs slowly.  Do NOT do strenuous activities, such as golfing, playing tennis, bowling, running, biking, weight lifting, gardening, mowing, or vacuuming for 2-4 weeks. Ask your doctor when it is okay to start.  Diet: Eat a light meal as desired this evening. You may resume your usual diet tomorrow.  Return to work: This is dependent on the type of work you do. For the most part you can return to a desk job within a week of surgery. If you are more active at work, please discuss this with your doctor.  What to expect after your surgery: You may have a slight burning sensation when you urinate on the first day. You may have a very small amount of blood in the urine. Expect to have a small amount of vaginal discharge/light bleeding for 1-2 weeks. It is not unusual to have abdominal soreness and bruising for up to 2 weeks. You may be tired and need more rest for about 1 week. You may experience shoulder pain for 24-72 hours. Lying flat in bed may relieve it.  Call your doctor for any of the following:  Develop a fever of 100.4 or greater  Inability to urinate 6 hours after discharge from  hospital  Severe pain not relieved by pain medications  Persistent of heavy bleeding at incision site  Redness or swelling around incision site after a week  Increasing nausea or vomiting  Patient Signature________________________________________ Nurse Signature_________________________________________    NO IBUPROFEN PRODUCTS (MOTRIN, ADVIL) OR ALEVE UNTIL 7:20 PM TODAY.    Post Anesthesia Home Care Instructions  Activity: Get plenty of rest for the remainder of the day. A responsible individual must stay with you for 24 hours following the procedure.  For the next 24 hours, DO NOT: -Drive a car -Advertising copywriterperate machinery -Drink alcoholic beverages -Take any medication unless instructed by your physician -Make any legal decisions or sign important papers.  Meals: Start with liquid foods such as gelatin or soup. Progress to regular foods as tolerated. Avoid greasy, spicy, heavy foods. If nausea and/or vomiting occur, drink only clear liquids until the nausea and/or vomiting subsides. Call your physician if vomiting continues.  Special Instructions/Symptoms: Your throat may feel dry or sore from the anesthesia or the breathing tube placed in your throat during surgery. If this causes discomfort, gargle with warm salt water. The discomfort should disappear within 24 hours.  If you had a scopolamine patch placed behind your ear for the management of post- operative nausea and/or vomiting:  1. The medication in the patch is effective for 72 hours, after which it should be removed.  Wrap patch in a tissue and  discard in the trash. Wash hands thoroughly with soap and water. 2. You may remove the patch earlier than 72 hours if you experience unpleasant side effects which may include dry mouth, dizziness or visual disturbances. 3. Avoid touching the patch. Wash your hands with soap and water after contact with the patch.

## 2017-02-18 NOTE — Anesthesia Preprocedure Evaluation (Signed)
Anesthesia Evaluation  Patient identified by MRN, date of birth, ID band Patient awake    Reviewed: Allergy & Precautions, H&P , Patient's Chart, lab work & pertinent test results, reviewed documented beta blocker date and time   Airway Mallampati: II  TM Distance: >3 FB Neck ROM: full    Dental no notable dental hx.    Pulmonary former smoker,    Pulmonary exam normal breath sounds clear to auscultation       Cardiovascular  Rhythm:regular Rate:Normal     Neuro/Psych    GI/Hepatic   Endo/Other  Morbid obesity  Renal/GU      Musculoskeletal   Abdominal   Peds  Hematology   Anesthesia Other Findings   Reproductive/Obstetrics                             Anesthesia Physical Anesthesia Plan  ASA: II  Anesthesia Plan: General   Post-op Pain Management:    Induction: Intravenous  Airway Management Planned: Oral ETT  Additional Equipment:   Intra-op Plan:   Post-operative Plan: Extubation in OR  Informed Consent: I have reviewed the patients History and Physical, chart, labs and discussed the procedure including the risks, benefits and alternatives for the proposed anesthesia with the patient or authorized representative who has indicated his/her understanding and acceptance.   Dental Advisory Given  Plan Discussed with: CRNA and Surgeon  Anesthesia Plan Comments: (  )        Anesthesia Quick Evaluation

## 2017-02-18 NOTE — Anesthesia Procedure Notes (Signed)
Procedure Name: Intubation Date/Time: 02/18/2017 12:23 PM Performed by: Rica RecordsICKELTON, Donley Harland Pre-anesthesia Checklist: Patient identified, Emergency Drugs available, Suction available and Patient being monitored Patient Re-evaluated:Patient Re-evaluated prior to inductionOxygen Delivery Method: Circle system utilized, Simple face mask and Non-rebreather mask Preoxygenation: Pre-oxygenation with 100% oxygen Intubation Type: IV induction Ventilation: Mask ventilation without difficulty Laryngoscope Size: Miller and 2 Grade View: Grade II Tube size: 7.0 mm Number of attempts: 1 Airway Equipment and Method: Stylet Placement Confirmation: ETT inserted through vocal cords under direct vision,  breath sounds checked- equal and bilateral and positive ETCO2 Secured at: 22 cm Dental Injury: Teeth and Oropharynx as per pre-operative assessment

## 2017-02-18 NOTE — Op Note (Signed)
02/18/2017  1:21 PM  PATIENT:  Janet Rich  21 y.o. female  PRE-OPERATIVE DIAGNOSIS:  Ovarian Cyst, Pelvic Pain  POST-OPERATIVE DIAGNOSIS:  RIGHT OVARIAN CYST  PROCEDURE:  Procedure(s): ROBOTIC ASSISTED DIAGNOSTIC/OPERATIVE LAPAROSCOPY EXCISION AND ABLATION OF ENDOMETRIOSIS LYSIS [facility] AND SIGMOID EXCISION OF RIGHT ENDOMETRIOMA EXCISION OF CUL DE SAC ENDOMETRIOSIS( MASTERS WINDOW)  SURGEON:  Surgeon(s): Olivia Mackieichard Anden Bartolo, MD  ASSISTANTSFredric Mare: BAILEY, CNM   ANESTHESIA:   local and general  ESTIMATED BLOOD LOSS: * No blood loss amount entered *   DRAINS: Urinary Catheter (Foley)   LOCAL MEDICATIONS USED:  MARCAINE     SPECIMEN:  Source of Specimen:  CUL DE SAC  DISPOSITION OF SPECIMEN:  PATHOLOGY  COUNTS:  YES  DICTATION #: [id number]  PLAN OF CARE: DC HOME  PATIENT DISPOSITION:  PACU - hemodynamically stable.

## 2017-02-18 NOTE — Transfer of Care (Signed)
Immediate Anesthesia Transfer of Care Note  Patient: Janet Rich  Procedure(s) Performed: Procedure(s): ROBOTIC ASSISTED DIAGNOSTIC LAPAROSCOPY,EXCISION AND ABLATION OF ENDOMETRIOSIS,LYSIS OF ADHESIONS (N/A)  Patient Location: PACU  Anesthesia Type:General  Level of Consciousness: awake and oriented  Airway & Oxygen Therapy: Patient Spontanous Breathing and Patient connected to nasal cannula oxygen  Post-op Assessment: Report given to RN and Post -op Vital signs reviewed and stable  Post vital signs: Reviewed and stable  Last Vitals:  Vitals:   02/18/17 1050  BP: 115/77  Pulse: 77  Resp: 16  Temp: 36.7 C    Last Pain:  Vitals:   02/18/17 1050  TempSrc: Oral  PainSc: 6       Patients Stated Pain Goal: 4 (02/18/17 1050)  Complications: No apparent anesthesia complications

## 2017-02-19 ENCOUNTER — Encounter (HOSPITAL_COMMUNITY): Payer: Self-pay | Admitting: Obstetrics and Gynecology

## 2017-02-19 NOTE — Anesthesia Postprocedure Evaluation (Signed)
Anesthesia Post Note  Patient: Janet Rich  Procedure(s) Performed: Procedure(s) (LRB): ROBOTIC ASSISTED DIAGNOSTIC LAPAROSCOPY,EXCISION AND ABLATION OF ENDOMETRIOSIS,LYSIS OF ADHESIONS (N/A)  Patient location during evaluation: PACU Anesthesia Type: General Level of consciousness: awake and alert Pain management: pain level controlled Vital Signs Assessment: post-procedure vital signs reviewed and stable Respiratory status: spontaneous breathing, nonlabored ventilation, respiratory function stable and patient connected to nasal cannula oxygen Cardiovascular status: blood pressure returned to baseline and stable Postop Assessment: no signs of nausea or vomiting Anesthetic complications: no       Last Vitals:  Vitals:   02/18/17 1715 02/18/17 1822  BP: 108/71 136/78  Pulse: 89 99  Resp: 14 16  Temp: 36.5 C 36.8 C    Last Pain:  Vitals:   02/18/17 1822  TempSrc:   PainSc: 3                  Maxtyn Nuzum EDWARD

## 2017-02-25 ENCOUNTER — Inpatient Hospital Stay (HOSPITAL_COMMUNITY)
Admission: AD | Admit: 2017-02-25 | Discharge: 2017-02-25 | Disposition: A | Discharge status: Home / Self Care | Payer: 59 | Source: Ambulatory Visit | Attending: Obstetrics and Gynecology | Admitting: Obstetrics and Gynecology

## 2017-02-25 DIAGNOSIS — Z9889 Other specified postprocedural states: Secondary | ICD-10-CM | POA: Insufficient documentation

## 2017-02-25 DIAGNOSIS — R102 Pelvic and perineal pain: Secondary | ICD-10-CM | POA: Insufficient documentation

## 2017-02-25 DIAGNOSIS — G8918 Other acute postprocedural pain: Secondary | ICD-10-CM | POA: Diagnosis present

## 2017-02-25 DIAGNOSIS — Z87891 Personal history of nicotine dependence: Secondary | ICD-10-CM | POA: Diagnosis not present

## 2017-02-25 LAB — URINALYSIS, ROUTINE W REFLEX MICROSCOPIC
BILIRUBIN URINE: NEGATIVE
GLUCOSE, UA: NEGATIVE mg/dL
HGB URINE DIPSTICK: NEGATIVE
Ketones, ur: NEGATIVE mg/dL
Leukocytes, UA: NEGATIVE
Nitrite: NEGATIVE
Protein, ur: NEGATIVE mg/dL
SPECIFIC GRAVITY, URINE: 1.026 (ref 1.005–1.030)
pH: 5 (ref 5.0–8.0)

## 2017-02-25 MED ORDER — OXYCODONE-ACETAMINOPHEN 5-325 MG PO TABS: 1.0000 | ORAL_TABLET | ORAL | 0 refills | Status: DC | PRN

## 2017-02-25 NOTE — MAU Provider Note (Signed)
Chief Complaint: Post-op Problem   First Provider Initiated Contact with Patient 02/25/17 2016      SUBJECTIVE HPI: Janet Rich is a 21 y.o. G0P0000 on POD # 7 following laprascopic surgery for endometriosis who presents to maternity admissions reporting pain in her tailbone area making it difficult to sit down. She also has concerns about 2 of her abdominal incisions and notes scant bleeding from each of them.  The pain in her tailbone started the day of her surgery on 5/3 but worsened 2 days ago and is now constant, worse with sitting or pressure on the area.  She is taking 1 Percocet 2-3 times daily which helps but is not removing her pain. She denies vaginal bleeding, vaginal itching/burning, urinary symptoms, h/a, dizziness, n/v, or fever/chills.     HPI  Past Medical History:  Diagnosis Date  . Anemia    with periods  . Ankle fracture    right  . Anxiety   . Asthma   . Back pain   . Depression   . Endometriosis   . GERD (gastroesophageal reflux disease)   . Headache   . Ovarian cyst   . Ovarian cyst   . PCOS (polycystic ovarian syndrome)   . Pneumonia    age 1  . PONV (postoperative nausea and vomiting)    nausea after wisdom teeth extraction  . Vaginal Pap smear, abnormal    bx ok   Past Surgical History:  Procedure Laterality Date  . NO PAST SURGERIES    . ROBOTIC ASSISTED DIAGNOSTIC LAPAROSCOPY N/A 02/18/2017   Procedure: ROBOTIC ASSISTED DIAGNOSTIC LAPAROSCOPY,EXCISION AND ABLATION OF ENDOMETRIOSIS,LYSIS OF ADHESIONS;  Surgeon: Olivia Mackie, MD;  Location: [facility];  Service: Gynecology;  Laterality: N/A;  . WISDOM TOOTH EXTRACTION  2013   Social History   Social History  . Marital status: Single    Spouse name: N/A  . Number of children: N/A  . Years of education: N/A   Occupational History  . Not on file.   Social History Main Topics  . Smoking status: Former Smoker    Types: Cigarettes  . Smokeless tobacco: Never Used     Comment: [age], quit   . Alcohol use Yes     Comment: occassionally  . Drug use: No  . Sexual activity: Yes    Birth control/ protection: None   Other Topics Concern  . Not on file   Social History Narrative  . No narrative on file   No current facility-administered medications on file prior to encounter.    Current Outpatient Prescriptions on File Prior to Encounter  Medication Sig Dispense Refill  . diclofenac (CATAFLAM) 50 MG tablet Take 1 tablet (50 mg total) by mouth 3 (three) times daily. Take with food (Patient taking differently: Take 50 mg by mouth 3 (three) times daily as needed (pain). Take with food) 60 tablet 1  . ibuprofen (ADVIL,MOTRIN) 800 MG tablet Take 800 mg by mouth every 6 (six) hours as needed for pain.   0  . Prenatal MV-Min-FA-Omega-3 (PRENATAL GUMMIES/DHA & FA) 0.4-32.5 MG CHEW Chew 2 each by mouth daily.    . promethazine (PHENERGAN) 25 MG tablet Take 25 mg by mouth every 6 (six) hours as needed for nausea or vomiting.    . traMADol (ULTRAM) 50 MG tablet Take 1 tablet (50 mg total) by mouth every 6 (six) hours as needed. (Patient taking differently: Take 50 mg by mouth every 6 (six) hours as needed (pain). ) 60 tablet 0  Allergies  Allergen Reactions  . Other     Must have anti-emetic prior to being given any narcotics   . Latex Rash    ROS:  Review of Systems   I have reviewed patient's Past Medical Hx, Surgical Hx, Family Hx, Social Hx, medications and allergies.   Physical Exam  Patient Vitals for the past 24 hrs:  BP Temp Temp src Pulse Resp Height Weight  02/25/17 2006 122/75 98.3 F (36.8 C) Oral 82 18 - -  02/25/17 1809 123/72 98.1 F (36.7 C) - 89 18 5' 0.5" (1.537 m) 203 lb 3.2 oz (92.2 kg)   Constitutional: Well-developed, well-nourished female in no acute distress.  Cardiovascular: normal rate Respiratory: normal effort GI: Abd soft, non-tender. Pos BS x 4 MS: Extremities nontender, no edema, normal ROM Neurologic: Alert and oriented x 4.  GU: Neg  CVAT.  PELVIC EXAM: SVE with no pelvic pain, no pain to palpation/pressure on posterior vaginal wall.  No pain in external rectal area.  Pain to palpation of sacral area from pt low back/buttocks only.    LAB RESULTS Results for orders placed or performed during the hospital encounter of 02/25/17 (from the past 24 hour(s))  Urinalysis, Routine w reflex microscopic     Status: None   Collection Time: 02/25/17  6:10 PM  Result Value Ref Range   Color, Urine YELLOW YELLOW   APPearance CLEAR CLEAR   Specific Gravity, Urine 1.026 1.005 - 1.030   pH 5.0 5.0 - 8.0   Glucose, UA NEGATIVE NEGATIVE mg/dL   Hgb urine dipstick NEGATIVE NEGATIVE   Bilirubin Urine NEGATIVE NEGATIVE   Ketones, ur NEGATIVE NEGATIVE mg/dL   Protein, ur NEGATIVE NEGATIVE mg/dL   Nitrite NEGATIVE NEGATIVE   Leukocytes, UA NEGATIVE NEGATIVE       IMAGING No results found.  MAU Management/MDM: Ordered labs and reviewed results.  Consult Dr Billy Coast.  Due to nature of surgery and locations of adhesions, pain in sacral areas is normal. Pt to treat with ibuprofen and Percocet as prescribed. Rx for Percocet 10 more tabs and pt to take 1-2 tabs Q 4-6 hours PRN.  Alternate heat/ice to area.  FU as scheduled, return to MAU as needed for emergencies.  Reassurance provided, pt questions answered.  Pt stable at time of discharge.  ASSESSMENT 1. Postoperative pain   2. Pain, female pelvic     PLAN Discharge home  Allergies as of 02/25/2017      Reactions   Other    Must have anti-emetic prior to being given any narcotics    Latex Rash      Medication List    STOP taking these medications   traMADol 50 MG tablet Commonly known as:  ULTRAM     TAKE these medications   bismuth subsalicylate 262 MG/15ML suspension Commonly known as:  PEPTO BISMOL Take 30 mLs by mouth every 6 (six) hours as needed for indigestion.   diclofenac 50 MG tablet Commonly known as:  CATAFLAM Take 1 tablet (50 mg total) by mouth 3  (three) times daily. Take with food What changed:  when to take this  reasons to take this  additional instructions   ibuprofen 800 MG tablet Commonly known as:  ADVIL,MOTRIN Take 800 mg by mouth every 6 (six) hours as needed for pain.   oxyCODONE-acetaminophen 5-325 MG tablet Commonly known as:  ROXICET Take 1-2 tablets by mouth every 4 (four) hours as needed for severe pain.   PRENATAL GUMMIES/DHA & FA 0.4-32.5  MG Chew Chew 2 each by mouth daily.   promethazine 25 MG tablet Commonly known as:  PHENERGAN Take 25 mg by mouth every 6 (six) hours as needed for nausea or vomiting.      Follow-up Information    Olivia Mackieaavon, Richard, MD Follow up.   Specialty:  Obstetrics and Gynecology Why:  As scheduled for follow up, return to MAU as needed for Gyn emergencies Contact information: [address] [place] [address] [phone]           Sharen CounterLisa Leftwich-Kirby Certified Nurse-Midwife 02/25/2017  9:24 PM

## 2017-02-25 NOTE — MAU Note (Addendum)
Pt reports she had her endometrium scraped and 3 cyst removed on 02/18/17. C/o mostly tailbone pain and shoulder pain (from gas). Worried about on of her Lap incisions.

## 2017-04-14 ENCOUNTER — Inpatient Hospital Stay (HOSPITAL_COMMUNITY)
Admission: AD | Admit: 2017-04-14 | Discharge: 2017-04-14 | Disposition: A | Discharge status: Home / Self Care | Payer: 59 | Source: Ambulatory Visit | Attending: Obstetrics and Gynecology | Admitting: Obstetrics and Gynecology

## 2017-04-14 ENCOUNTER — Encounter (HOSPITAL_COMMUNITY): Payer: Self-pay

## 2017-04-14 DIAGNOSIS — Z888 Allergy status to other drugs, medicaments and biological substances status: Secondary | ICD-10-CM | POA: Diagnosis not present

## 2017-04-14 DIAGNOSIS — Z87891 Personal history of nicotine dependence: Secondary | ICD-10-CM | POA: Diagnosis not present

## 2017-04-14 DIAGNOSIS — Z833 Family history of diabetes mellitus: Secondary | ICD-10-CM | POA: Insufficient documentation

## 2017-04-14 DIAGNOSIS — N809 Endometriosis, unspecified: Secondary | ICD-10-CM | POA: Insufficient documentation

## 2017-04-14 DIAGNOSIS — R102 Pelvic and perineal pain: Secondary | ICD-10-CM | POA: Insufficient documentation

## 2017-04-14 DIAGNOSIS — F419 Anxiety disorder, unspecified: Secondary | ICD-10-CM | POA: Insufficient documentation

## 2017-04-14 DIAGNOSIS — F329 Major depressive disorder, single episode, unspecified: Secondary | ICD-10-CM | POA: Insufficient documentation

## 2017-04-14 DIAGNOSIS — K219 Gastro-esophageal reflux disease without esophagitis: Secondary | ICD-10-CM | POA: Insufficient documentation

## 2017-04-14 DIAGNOSIS — Z79899 Other long term (current) drug therapy: Secondary | ICD-10-CM | POA: Diagnosis not present

## 2017-04-14 DIAGNOSIS — Z818 Family history of other mental and behavioral disorders: Secondary | ICD-10-CM | POA: Insufficient documentation

## 2017-04-14 DIAGNOSIS — Z8249 Family history of ischemic heart disease and other diseases of the circulatory system: Secondary | ICD-10-CM | POA: Insufficient documentation

## 2017-04-14 DIAGNOSIS — Z8041 Family history of malignant neoplasm of ovary: Secondary | ICD-10-CM | POA: Insufficient documentation

## 2017-04-14 DIAGNOSIS — Z800 Family history of malignant neoplasm of digestive organs: Secondary | ICD-10-CM | POA: Insufficient documentation

## 2017-04-14 DIAGNOSIS — E282 Polycystic ovarian syndrome: Secondary | ICD-10-CM | POA: Insufficient documentation

## 2017-04-14 DIAGNOSIS — J45909 Unspecified asthma, uncomplicated: Secondary | ICD-10-CM | POA: Insufficient documentation

## 2017-04-14 DIAGNOSIS — Z9889 Other specified postprocedural states: Secondary | ICD-10-CM | POA: Insufficient documentation

## 2017-04-14 DIAGNOSIS — Z3202 Encounter for pregnancy test, result negative: Secondary | ICD-10-CM | POA: Diagnosis present

## 2017-04-14 DIAGNOSIS — Z803 Family history of malignant neoplasm of breast: Secondary | ICD-10-CM | POA: Diagnosis not present

## 2017-04-14 DIAGNOSIS — Z90710 Acquired absence of both cervix and uterus: Secondary | ICD-10-CM | POA: Diagnosis not present

## 2017-04-14 DIAGNOSIS — N946 Dysmenorrhea, unspecified: Secondary | ICD-10-CM

## 2017-04-14 LAB — URINALYSIS, ROUTINE W REFLEX MICROSCOPIC
Bilirubin Urine: NEGATIVE
Glucose, UA: NEGATIVE mg/dL
Ketones, ur: NEGATIVE mg/dL
LEUKOCYTES UA: NEGATIVE
NITRITE: NEGATIVE
PROTEIN: NEGATIVE mg/dL
Specific Gravity, Urine: 1.015 (ref 1.005–1.030)
pH: 7 (ref 5.0–8.0)

## 2017-04-14 LAB — POCT PREGNANCY, URINE: PREG TEST UR: NEGATIVE

## 2017-04-14 LAB — URINALYSIS, MICROSCOPIC (REFLEX): WBC UA: NONE SEEN WBC/hpf (ref 0–5)

## 2017-04-14 MED ORDER — KETOROLAC TROMETHAMINE 60 MG/2ML IM SOLN
60.0000 mg | Freq: Once | INTRAMUSCULAR | Status: AC
  Administered 2017-04-14: 60 mg via INTRAMUSCULAR
  Filled 2017-04-14: qty 2

## 2017-04-14 MED ORDER — IBUPROFEN 800 MG PO TABS: 800.0000 mg | ORAL_TABLET | Freq: Three times a day (TID) | ORAL | 1 refills | Status: DC | PRN

## 2017-04-14 NOTE — MAU Provider Note (Signed)
Chief Complaint:  Ovarian Cyst and Abdominal Pain   First Provider Initiated Contact with Patient 04/14/17 1140      HPI: Janet Rich is a 21 y.o. G0P0000 who presents to maternity admissions reporting pelvic pain which started as cramping 2 weeks ago then got worse today after her cat jumped on her chest.  She had surgery on 02/18/17 for mild endometriosis and an endometrioma was removed.  Thinks she may have "cysts on both sides again".  Trying to get pregnant.  Took oxycodone last night which relieved the pain temporarily . She reports vaginal bleeding, but no vaginal itching/burning, urinary symptoms, h/a, dizziness, n/v, or fever/chills.   Abdominal Pain  This is a recurrent problem. The current episode started 1 to 4 weeks ago. The onset quality is gradual. The problem occurs intermittently. The problem has been unchanged. The pain is located in the LLQ, RLQ, suprapubic region and periumbilical region. The pain is moderate. The quality of the pain is cramping, aching and sharp. The abdominal pain does not radiate. Associated symptoms include constipation. Pertinent negatives include no arthralgias, diarrhea, fever, myalgias, nausea or vomiting. The pain is aggravated by palpation. The pain is relieved by nothing. She has tried oral narcotic analgesics for the symptoms. The treatment provided moderate relief. Her past medical history is significant for abdominal surgery (02/18/17).    RN Note: Painful period LMP 6/26 ?possible ovarian cyst expressed by patient States cat jumped on her stomach and has been in pain since Rating pain 9/10 Has taken oxycodone and phenergan last night at 8pm  Past Medical History: Past Medical History:  Diagnosis Date  . Anemia    with periods  . Ankle fracture    right  . Anxiety   . Asthma   . Back pain   . Depression   . Endometriosis   . GERD (gastroesophageal reflux disease)   . Headache   . Ovarian cyst   . Ovarian cyst   . PCOS (polycystic  ovarian syndrome)   . Pneumonia    age 32  . PONV (postoperative nausea and vomiting)    nausea after wisdom teeth extraction  . Vaginal Pap smear, abnormal    bx ok    Past obstetric history: OB History  Gravida Para Term Preterm AB Living  0 0 0 0 0 0  SAB TAB Ectopic Multiple Live Births  0 0 0 0          Past Surgical History: Past Surgical History:  Procedure Laterality Date  . NO PAST SURGERIES    . ROBOTIC ASSISTED DIAGNOSTIC LAPAROSCOPY N/A 02/18/2017   Procedure: ROBOTIC ASSISTED DIAGNOSTIC LAPAROSCOPY,EXCISION AND ABLATION OF ENDOMETRIOSIS,LYSIS OF ADHESIONS;  Surgeon: Olivia Mackie, MD;  Location: [facility];  Service: Gynecology;  Laterality: N/A;  . WISDOM TOOTH EXTRACTION  2013    Family History: Family History  Problem Relation Age of Onset  . Endometriosis Mother   . Diabetes Mother   . Breast cancer Paternal Grandmother   . Cancer Paternal Grandmother   . Schizophrenia Father   . Heart disease Neg Hx   . Colon cancer Neg Hx   . Ovarian cancer Neg Hx     Social History: Social History  Substance Use Topics  . Smoking status: Former Smoker    Types: Cigarettes  . Smokeless tobacco: Never Used     Comment: age [age], quit  . Alcohol use Yes     Comment: occassionally    Allergies:  Allergies  Allergen Reactions  .  Other     Must have anti-emetic prior to being given any narcotics   . Latex Rash    Meds:  Prescriptions Prior to Admission  Medication Sig Dispense Refill Last Dose  . ibuprofen (ADVIL,MOTRIN) 800 MG tablet Take 800 mg by mouth every 6 (six) hours as needed for pain.   0 Past Month at Unknown time  . OVER THE COUNTER MEDICATION Apply 1 application topically as needed. Pt uses a CBD cream that she gets from the Chiropracter, for her slipped disc.   Past Week at Unknown time  . oxyCODONE-acetaminophen (ROXICET) 5-325 MG tablet Take 1-2 tablets by mouth every 4 (four) hours as needed for severe pain. 10 tablet 0 04/13/2017 at Unknown  time  . Prenatal MV-Min-FA-Omega-3 (PRENATAL GUMMIES/DHA & FA) 0.4-32.5 MG CHEW Chew 2 each by mouth daily.   Past Week at Unknown time  . promethazine (PHENERGAN) 25 MG tablet Take 25 mg by mouth every 6 (six) hours as needed for nausea or vomiting.   Past Month at Unknown time  . diclofenac (CATAFLAM) 50 MG tablet Take 1 tablet (50 mg total) by mouth 3 (three) times daily. Take with food (Patient not taking: Reported on 04/14/2017) 60 tablet 1 Not Taking at Unknown time    I have reviewed patient's Past Medical Hx, Surgical Hx, Family Hx, Social Hx, medications and allergies.  ROS:  Review of Systems  Constitutional: Negative for fever.  Gastrointestinal: Positive for abdominal pain and constipation. Negative for diarrhea, nausea and vomiting.  Musculoskeletal: Negative for arthralgias and myalgias.   Other systems negative     Physical Exam  Patient Vitals for the past 24 hrs:  BP Temp Temp src Pulse Resp SpO2 Weight  04/14/17 1049 120/66 98 F (36.7 C) Oral 80 16 97 % 201 lb 1.9 oz (91.2 kg)   Constitutional: Well-developed, well-nourished female in no acute distress.  Cardiovascular: normal rate and rhythm, no ectopy audible, S1 & S2 heard, no murmur Respiratory: normal effort, no distress. Lungs CTAB with no wheezes or crackles GI: Abd soft, tender over lower abdomen.  Nondistended.  No rebound, No guarding.  MS: Extremities nontender, no edema, normal ROM Neurologic: Alert and oriented x 4.   Grossly nonfocal. GU: Neg CVAT. Skin:  Warm and Dry Psych:  Affect appropriate.  PELVIC EXAM: Small vaginal bleeding c/w menses.   Labs: Results for orders placed or performed during the hospital encounter of 04/14/17 (from the past 24 hour(s))  Urinalysis, Routine w reflex microscopic     Status: Abnormal   Collection Time: 04/14/17 10:51 AM  Result Value Ref Range   Color, Urine YELLOW YELLOW   APPearance CLEAR CLEAR   Specific Gravity, Urine 1.015 1.005 - 1.030   pH 7.0 5.0 -  8.0   Glucose, UA NEGATIVE NEGATIVE mg/dL   Hgb urine dipstick LARGE (A) NEGATIVE   Bilirubin Urine NEGATIVE NEGATIVE   Ketones, ur NEGATIVE NEGATIVE mg/dL   Protein, ur NEGATIVE NEGATIVE mg/dL   Nitrite NEGATIVE NEGATIVE   Leukocytes, UA NEGATIVE NEGATIVE  Urinalysis, Microscopic (reflex)     Status: Abnormal   Collection Time: 04/14/17 10:51 AM  Result Value Ref Range   RBC / HPF TOO NUMEROUS TO COUNT 0 - 5 RBC/hpf   WBC, UA NONE SEEN 0 - 5 WBC/hpf   Bacteria, UA RARE (A) NONE SEEN   Squamous Epithelial / LPF 0-5 (A) NONE SEEN  Pregnancy, urine POC     Status: None   Collection Time: 04/14/17 11:05 AM  Result Value Ref Range   Preg Test, Ur NEGATIVE NEGATIVE      Imaging:  No results found.  MAU Course/MDM: I have ordered labs as follows:  See above Imaging ordered: none   Consult Dr Billy Coast who recommends Toradol, NSAIDs and followup in office if not better in a few days..   Treatments in MAU included Toradol which gave excellent relief Rx Ibuprofen for home use.   Pt stable at time of discharge.  Assessment: Pelvic pain - Plan: Discharge patient  Dysmenorrhea  History of mild endometriosis and endometrioma  Plan: Discharge home Recommend Supportive care Rx sent for Ibuprofen for pain (prn use)   Encouraged to return here or to other Urgent Care/ED if she develops worsening of symptoms, increase in pain, fever, or other concerning symptoms.   Wynelle Bourgeois CNM, MSN Certified Nurse-Midwife 04/14/2017 11:51 AM

## 2017-04-14 NOTE — Discharge Instructions (Signed)
Dysmenorrhea Dysmenorrhea means painful cramps during your period (menstrual period). You will have pain in your lower belly (abdomen). The pain is caused by the tightening (contracting) of the muscles of the womb (uterus). The pain may be mild or very bad. With this condition, you may:  Have a headache.  Feel sick to your stomach (nauseous).  Throw up (vomit).  Have lower back pain.  Follow these instructions at home: Helping pain and cramping  Put heat on your lower back or belly when you have pain or cramps. Use the heat source that your doctor tells you to use. ? Place a towel between your skin and the heat. ? Leave the heat on for 20-30 minutes. ? Remove the heat if your skin turns bright red. This is especially important if you cannot feel pain, heat, or cold. ? Do not have a heating pad on during sleep.  Do aerobic exercises. These include walking, swimming, or biking. These may help with cramps.  Massage your lower back or belly. This may help lessen pain. General instructions  Take over-the-counter and prescription medicines only as told by your doctor.  Do not drive or use heavy machinery while taking prescription pain medicine.  Avoid alcohol and caffeine during and right before your period. These can make cramps worse.  Do not use any products that have nicotine or tobacco. These include cigarettes and e-cigarettes. If you need help quitting, ask your doctor.  Keep all follow-up visits as told by your doctor. This is important. Contact a doctor if:  You have pain that gets worse.  You have pain that does not get better with medicine.  You have pain during sex.  You feel sick to your stomach or you throw up during your period, and medicine does not help. Get help right away if:  You pass out (faint). Summary  Dysmenorrhea means painful cramps during your period (menstrual period).  Put heat on your lower back or belly when you have pain or cramps.  Do  exercises like walking, swimming, or biking to help with cramps.  Contact a doctor if you have pain during sex. This information is not intended to replace advice given to you by your health care provider. Make sure you discuss any questions you have with your health care provider. Document Released: 01/01/2009 Document Revised: 10/22/2016 Document Reviewed: 10/22/2016 Elsevier Interactive Patient Education  2017 Elsevier Inc.  Pelvic Pain, Female Pelvic pain is pain in your lower abdomen, below your belly button and between your hips. The pain may start suddenly (acute), keep coming back (recurring), or last a long time (chronic). Pelvic pain that lasts longer than six months is considered chronic. Pelvic pain may affect your:  Reproductive organs.  Urinary system.  Digestive tract.  Musculoskeletal system.  There are many potential causes of pelvic pain. Sometimes, the pain can be a result of digestive or urinary conditions, strained muscles or ligaments, or even reproductive conditions. Sometimes the cause of pelvic pain is not known. Follow these instructions at home:  Take over-the-counter and prescription medicines only as told by your health care provider.  Rest as told by your health care provider.  Do not have sex it if hurts.  Keep a journal of your pelvic pain. Write down: ? When the pain started. ? Where the pain is located. ? What seems to make the pain better or worse, such as food or your menstrual cycle. ? Any symptoms you have along with the pain.  Keep all  follow-up visits as told by your health care provider. This is important. Contact a health care provider if:  Medicine does not help your pain.  Your pain comes back.  You have new symptoms.  You have abnormal vaginal discharge or bleeding, including bleeding after menopause.  You have a fever or chills.  You are constipated.  You have blood in your urine or stool.  You have foul-smelling  urine.  You feel weak or lightheaded. Get help right away if:  You have sudden severe pain.  Your pain gets steadily worse.  You have severe pain along with fever, nausea, vomiting, or excessive sweating.  You lose consciousness. This information is not intended to replace advice given to you by your health care provider. Make sure you discuss any questions you have with your health care provider. Document Released: 09/01/2004 Document Revised: 10/30/2015 Document Reviewed: 07/26/2015 Elsevier Interactive Patient Education  2018 [employer].

## 2017-04-14 NOTE — Progress Notes (Addendum)
Non pregnant presents to triage for increasing pain that started last night. Took left over oxycodone prescribed to her for laparoscopy procedure in May. States helped.  Today 20 lb cat landed on her sternum area that intensified her pain, did not take anything for it. States her mother told her not to take anything in case hospital gives her something for pain.  Hx of cyst on right and left ovaries. Removed in May.   1221: toradol IM right glut given per order.   1305: states feels much better. 0/10 pain.  1310: Provider made aware.   Discharge instructions given with pt understanding. Pt left unit via ambulatory with SO

## 2017-04-14 NOTE — MAU Note (Signed)
Painful period LMP 6/26 ?possible ovarian cyst expressed by patient States cat jumped on her stomach and has been in pain since Rating pain 9/10 Has taken oxycodone and phenergan last night at 8pm

## 2017-04-19 ENCOUNTER — Other Ambulatory Visit: Payer: Self-pay | Admitting: Obstetrics and Gynecology

## 2017-04-20 NOTE — Progress Notes (Deleted)
ANNUAL PREVENTATIVE CARE GYN  ENCOUNTER NOTE  Subjective:       Janet Rich is a 21 y.o. G0P0000 female here for a routine annual gynecologic exam.  Current complaints: 1.  Dysmenorrhea-currently controlled on Spintec; only experiences mild cramping at this time 2. Abnormal uterine bleeding-currently controlled on Sprintec; has mild spotting at times but states is tolerable   Gynecologic History Patient's last menstrual period was 04/13/2017. Contraception: OCP (estrogen/progesterone) Last Pap: 04/23/2016 lgsil 05/2016 colpo- neg ecc Results were: unknown Obstetric History OB History  Gravida Para Term Preterm AB Living  0 0 0 0 0 0  SAB TAB Ectopic Multiple Live Births  0 0 0 0          Past Medical History:  Diagnosis Date  . Anemia    with periods  . Ankle fracture    right  . Anxiety   . Asthma   . Back pain   . Depression   . Endometriosis   . GERD (gastroesophageal reflux disease)   . Headache   . Ovarian cyst   . Ovarian cyst   . PCOS (polycystic ovarian syndrome)   . Pneumonia    age 58  . PONV (postoperative nausea and vomiting)    nausea after wisdom teeth extraction  . Vaginal Pap smear, abnormal    bx ok    Past Surgical History:  Procedure Laterality Date  . NO PAST SURGERIES    . ROBOTIC ASSISTED DIAGNOSTIC LAPAROSCOPY N/A 02/18/2017   Procedure: ROBOTIC ASSISTED DIAGNOSTIC LAPAROSCOPY,EXCISION AND ABLATION OF ENDOMETRIOSIS,LYSIS OF ADHESIONS;  Surgeon: Olivia Mackie, MD;  Location: [facility];  Service: Gynecology;  Laterality: N/A;  . WISDOM TOOTH EXTRACTION  2013    Current Outpatient Prescriptions on File Prior to Visit  Medication Sig Dispense Refill  . ibuprofen (ADVIL,MOTRIN) 800 MG tablet Take 1 tablet (800 mg total) by mouth every 8 (eight) hours as needed. 60 tablet 1  . norgestimate-ethinyl estradiol (ORTHO-CYCLEN,SPRINTEC,PREVIFEM) 0.25-35 MG-MCG tablet TAKE 1 TABLET BY MOUTH  DAILY 84 tablet 0  . OVER THE COUNTER MEDICATION Apply 1  application topically as needed. Pt uses a CBD cream that she gets from the Chiropracter, for her slipped disc.    Marland Kitchen oxyCODONE-acetaminophen (ROXICET) 5-325 MG tablet Take 1-2 tablets by mouth every 4 (four) hours as needed for severe pain. 10 tablet 0  . Prenatal MV-Min-FA-Omega-3 (PRENATAL GUMMIES/DHA & FA) 0.4-32.5 MG CHEW Chew 2 each by mouth daily.    . promethazine (PHENERGAN) 25 MG tablet Take 25 mg by mouth every 6 (six) hours as needed for nausea or vomiting.     No current facility-administered medications on file prior to visit.     Allergies  Allergen Reactions  . Other     Must have anti-emetic prior to being given any narcotics   . Latex Rash    Social History   Social History  . Marital status: Married    Spouse name: N/A  . Number of children: N/A  . Years of education: N/A   Occupational History  . Not on file.   Social History Main Topics  . Smoking status: Former Smoker    Types: Cigarettes  . Smokeless tobacco: Never Used     Comment: age [age], quit  . Alcohol use Yes     Comment: occassionally  . Drug use: No  . Sexual activity: Yes    Birth control/ protection: None   Other Topics Concern  . Not on file   Social History Narrative  .  No narrative on file    Family History  Problem Relation Age of Onset  . Endometriosis Mother   . Diabetes Mother   . Breast cancer Paternal Grandmother   . Cancer Paternal Grandmother   . Schizophrenia Father   . Heart disease Neg Hx   . Colon cancer Neg Hx   . Ovarian cancer Neg Hx     The following portions of the patient's history were reviewed and updated as appropriate: allergies, current medications, past family history, past medical history, past social history, past surgical history and problem list.  Review of Systems ROS   Objective:   LMP 04/13/2017  CONSTITUTIONAL: Well-developed, well-nourished female in no acute distress.  PSYCHIATRIC: Normal mood and affect. Normal behavior. Normal  judgment and thought content. NEUROLGIC: Alert and oriented to person, place, and time. Normal muscle tone coordination. No cranial nerve deficit noted. HENT:  Normocephalic, atraumatic, External right and left ear normal. EYES: Conjunctivae are normal. No scleral icterus.  NECK: Normal range of motion, supple, no masses.  Normal thyroid.  SKIN: Skin is warm and dry. No rash noted. Not diaphoretic. No erythema. No pallor. CARDIOVASCULAR: Normal heart rate noted, regular rhythm, no murmur. RESPIRATORY: Clear to auscultation bilaterally. Effort and breath sounds normal, no problems with respiration noted. BREASTS: Symmetric in size. No masses, skin changes, nipple drainage, or lymphadenopathy. ABDOMEN: Soft, normal bowel sounds, no distention noted.  No tenderness, rebound or guarding.  BLADDER: Normal PELVIC:  External Genitalia: Normal  BUS: Normal  Vagina: Normal  Cervix: Normal, mobile, nontender  Uterus: Normal size and shape, nontender  Adnexa: Normal, nonpalpable, nontender MUSCULOSKELETAL: Normal range of motion. No tenderness.  No cyanosis, clubbing, or edema.  2+ distal pulses. LYMPHATIC: No Axillary, Supraclavicular, or Inguinal Adenopathy.    Assessment:   Annual gynecologic examination 21 y.o. Contraception: OCP (estrogen/progesterone) Overweight Problem List Items Addressed This Visit    History of migraine headaches   Anxiety and depression   Increased BMI (body mass index)   Family history of endometriosis in first degree relative    Other Visit Diagnoses    Well woman exam    -  Primary   Pap smear abnormality of cervix with LGSIL          Plan:  Pap: Pap, Reflex if ASCUS and GC/CT NAAT Mammogram: Not Indicated Stool Guaiac Testing:  Not Indicated Routine preventative health maintenance measures emphasized: Exercise/Diet/Weight control, Tobacco Warnings, Alcohol/Substance use risks, Stress Management, Peer Pressure Issues and Safe Sex  1. Return to Clinic  - 1 Year for annual exam 2.Refill Sprintec for one year 3. Recommend a healthy lifestyle to reduce BMI   Darol Destinerystal Brylynn Hanssen, CMA   I have seen, interviewed, and examined the patient in conjunction with the [employer] [facility] and affirm the diagnosis and management plan. Martin A. DeFrancesco, MD, FACOG  Note: This dictation was prepared with Dragon dictation along with smaller phrase technology. Any transcriptional errors that result from this process are unintentional.

## 2017-04-24 ENCOUNTER — Emergency Department: Payer: 59

## 2017-04-24 ENCOUNTER — Emergency Department
Admission: EM | Admit: 2017-04-24 | Discharge: 2017-04-24 | Disposition: A | Discharge status: Home / Self Care | Payer: 59 | Attending: Emergency Medicine | Admitting: Emergency Medicine

## 2017-04-24 ENCOUNTER — Encounter: Payer: Self-pay | Admitting: Emergency Medicine

## 2017-04-24 DIAGNOSIS — J4521 Mild intermittent asthma with (acute) exacerbation: Secondary | ICD-10-CM | POA: Diagnosis not present

## 2017-04-24 DIAGNOSIS — J069 Acute upper respiratory infection, unspecified: Secondary | ICD-10-CM

## 2017-04-24 DIAGNOSIS — Z79899 Other long term (current) drug therapy: Secondary | ICD-10-CM | POA: Diagnosis not present

## 2017-04-24 DIAGNOSIS — Z87891 Personal history of nicotine dependence: Secondary | ICD-10-CM | POA: Diagnosis not present

## 2017-04-24 MED ORDER — ALBUTEROL SULFATE (2.5 MG/3ML) 0.083% IN NEBU
5.0000 mg | INHALATION_SOLUTION | Freq: Once | RESPIRATORY_TRACT | Status: AC
  Administered 2017-04-24: 5 mg via RESPIRATORY_TRACT
  Filled 2017-04-24: qty 6

## 2017-04-24 MED ORDER — ALBUTEROL SULFATE (2.5 MG/3ML) 0.083% IN NEBU
INHALATION_SOLUTION | RESPIRATORY_TRACT | Status: AC
  Filled 2017-04-24: qty 6

## 2017-04-24 MED ORDER — METHYLPREDNISOLONE SODIUM SUCC 125 MG IJ SOLR
125.0000 mg | Freq: Once | INTRAMUSCULAR | Status: AC
  Administered 2017-04-24: 125 mg via INTRAMUSCULAR
  Filled 2017-04-24: qty 2

## 2017-04-24 MED ORDER — ALBUTEROL SULFATE HFA 108 (90 BASE) MCG/ACT IN AERS: 2.0000 | INHALATION_SPRAY | RESPIRATORY_TRACT | 0 refills | Status: DC | PRN

## 2017-04-24 MED ORDER — PREDNISONE 50 MG PO TABS: 50.0000 mg | ORAL_TABLET | Freq: Every day | ORAL | 0 refills | Status: DC

## 2017-04-24 NOTE — ED Triage Notes (Addendum)
Patient presents to the ED with shortness of breath for the past 2 days.  Patient states she has history of asthma and has recent had a cold.  Patient is complaining of a sore throat and cough.  Patient states she has some chest pain with very deep breaths.  Breath sounds are clear.  Patient reports that she does not have an inhaler.

## 2017-04-24 NOTE — ED Notes (Signed)
Pt reports improvement of SOB with albuterol treatment.

## 2017-04-24 NOTE — ED Provider Notes (Signed)
[facility] Emergency Department Provider Note  ____________________________________________  Time seen: Approximately 8:07 PM  I have reviewed the triage vital signs and the nursing notes.   HISTORY  Chief Complaint Asthma    HPI Janet Rich is a 21 y.o. female who presents emergency Department with asthma exacerbation from a "cold." Patient reports that she began with some nasal congestion and scratchy throat 5 days ago. She reports that she started having asthma symptoms yesterday which increased today. Patient reports that she does not have an albuterol inhaler at house in last asthma attack was greater than 8 months ago. Patient does not take any medications for her asthma on an ongoing basis. Patient reports that she is managing her cold symptoms with over-the-counter medication and is here for her asthma exacerbation.   Past Medical History:  Diagnosis Date  . Anemia    with periods  . Ankle fracture    right  . Anxiety   . Asthma   . Back pain   . Depression   . Endometriosis   . GERD (gastroesophageal reflux disease)   . Headache   . Ovarian cyst   . Ovarian cyst   . PCOS (polycystic ovarian syndrome)   . Pneumonia    age 34  . PONV (postoperative nausea and vomiting)    nausea after wisdom teeth extraction  . Vaginal Pap smear, abnormal    bx ok    Patient Active Problem List   Diagnosis Date Noted  . Dysmenorrhea 05/01/2015  . History of migraine headaches 05/01/2015  . Anxiety and depression 05/01/2015  . Increased BMI (body mass index) 05/01/2015  . Family history of endometriosis in first degree relative 05/01/2015    Past Surgical History:  Procedure Laterality Date  . NO PAST SURGERIES    . ROBOTIC ASSISTED DIAGNOSTIC LAPAROSCOPY N/A 02/18/2017   Procedure: ROBOTIC ASSISTED DIAGNOSTIC LAPAROSCOPY,EXCISION AND ABLATION OF ENDOMETRIOSIS,LYSIS OF ADHESIONS;  Surgeon: Olivia Mackie, MD;  Location: [facility];  Service:  Gynecology;  Laterality: N/A;  . WISDOM TOOTH EXTRACTION  2013    Prior to Admission medications   Medication Sig Start Date End Date Taking? Authorizing Provider  albuterol (PROVENTIL HFA;VENTOLIN HFA) 108 (90 Base) MCG/ACT inhaler Inhale 2 puffs into the lungs every 4 (four) hours as needed for wheezing or shortness of breath. 04/24/17   Kiley Solimine, Delorise Royals, PA-C  ibuprofen (ADVIL,MOTRIN) 800 MG tablet Take 1 tablet (800 mg total) by mouth every 8 (eight) hours as needed. 04/14/17   Aviva Signs, CNM  norgestimate-ethinyl estradiol (ORTHO-CYCLEN,SPRINTEC,PREVIFEM) 0.25-35 MG-MCG tablet TAKE 1 TABLET BY MOUTH  DAILY 04/20/17   Defrancesco, Prentice Docker, MD  OVER THE COUNTER MEDICATION Apply 1 application topically as needed. Pt uses a CBD cream that she gets from the Chiropracter, for her slipped disc.    [provider]  oxyCODONE-acetaminophen (ROXICET) 5-325 MG tablet Take 1-2 tablets by mouth every 4 (four) hours as needed for severe pain. 02/25/17   Leftwich-Kirby, Wilmer Floor, CNM  predniSONE (DELTASONE) 50 MG tablet Take 1 tablet (50 mg total) by mouth daily with breakfast. 04/24/17   Yasmen Cortner, Delorise Royals, PA-C  Prenatal MV-Min-FA-Omega-3 (PRENATAL GUMMIES/DHA & FA) 0.4-32.5 MG CHEW Chew 2 each by mouth daily.    [provider]  promethazine (PHENERGAN) 25 MG tablet Take 25 mg by mouth every 6 (six) hours as needed for nausea or vomiting.    [provider]    Allergies Other and Latex  Family History  Problem Relation Age  of Onset  . Endometriosis Mother   . Diabetes Mother   . Breast cancer Paternal Grandmother   . Cancer Paternal Grandmother   . Schizophrenia Father   . Heart disease Neg Hx   . Colon cancer Neg Hx   . Ovarian cancer Neg Hx     Social History Social History  Substance Use Topics  . Smoking status: Former Smoker    Types: Cigarettes  . Smokeless tobacco: Never Used     Comment: age 3/14, quit  . Alcohol use Yes     Comment:  occassionally     Review of Systems  Constitutional: No fever/chills Eyes: No visual changes. No discharge ENT: Positive for nasal congestion and scratchy throat. Cardiovascular: no chest pain.Positive for chest tightness. Respiratory: Positive cough. Positive SOB. Gastrointestinal: No abdominal pain.  No nausea, no vomiting.  Musculoskeletal: Negative for musculoskeletal pain. Skin: Negative for rash, abrasions, lacerations, ecchymosis. Neurological: Negative for headaches, focal weakness or numbness. 10-point ROS otherwise negative.  ____________________________________________   PHYSICAL EXAM:  VITAL SIGNS: ED Triage Vitals  Enc Vitals Group     BP 04/24/17 1856 118/73     Pulse Rate 04/24/17 1856 73     Resp 04/24/17 1856 18     Temp 04/24/17 1856 98.7 F (37.1 C)     Temp Source 04/24/17 1856 Oral     SpO2 04/24/17 1856 99 %     Weight 04/24/17 1857 201 lb (91.2 kg)     Height 04/24/17 1857 5' 0.5" (1.537 m)     Head Circumference --      Peak Flow --      Pain Score 04/24/17 1856 6     Pain Loc --      Pain Edu? --      Excl. in GC? --      Constitutional: Alert and oriented. Well appearing and in no acute distress. Eyes: Conjunctivae are normal. PERRL. EOMI. Head: Atraumatic. ENT:      Ears: EACs and TMs unremarkable bilaterally.      Nose: Mild to moderate congestion/rhinnorhea.      Mouth/Throat: Mucous membranes are moist. Oropharynx is mildly erythematous but nonedematous. He is midline. Neck: No stridor.   Hematological/Lymphatic/Immunilogical: No cervical lymphadenopathy. Cardiovascular: Normal rate, regular rhythm. Normal S1 and S2.  Good peripheral circulation. Respiratory: Normal respiratory effort without tachypnea or retractions. Lungs CTAB. Good air entry to the bases with no decreased or absent breath sounds. Patient was given albuterol prior to assessment by provider. Musculoskeletal: Full range of motion to all extremities. No gross  deformities appreciated. Neurologic:  Normal speech and language. No gross focal neurologic deficits are appreciated.  Skin:  Skin is warm, dry and intact. No rash noted. Psychiatric: Mood and affect are normal. Speech and behavior are normal. Patient exhibits appropriate insight and judgement.   ____________________________________________   LABS (all labs ordered are listed, but only abnormal results are displayed)  Labs Reviewed - No data to display ____________________________________________  EKG  EKG reveals normal sinus rhythm at a rate of 69 bpm. No ST elevations or depressions noted. PR, QRS, QT interval within normal limits. Normal axis. No Q waves or delta waves identified.  ____________________________________________  RADIOLOGY Festus Barren Valita Righter, personally viewed and evaluated these images (plain radiographs) as part of my medical decision making, as well as reviewing the written report by the radiologist.  Dg Chest 2 View  Result Date: 04/24/2017 CLINICAL DATA:  Shortness of breath for 2 days. Cough and  sore throat. EXAM: CHEST  2 VIEW COMPARISON:  None. FINDINGS: The cardiomediastinal contours are normal. The lungs are clear. Pulmonary vasculature is normal. No consolidation, pleural effusion, or pneumothorax. No acute osseous abnormalities are seen. IMPRESSION: Unremarkable radiographs of the chest. Electronically Signed   By: Rubye OaksMelanie  Ehinger M.D.   On: 04/24/2017 19:15    ____________________________________________    PROCEDURES  Procedure(s) performed:    Procedures    Medications  methylPREDNISolone sodium succinate (SOLU-MEDROL) 125 mg/2 mL injection 125 mg (not administered)  albuterol (PROVENTIL) (2.5 MG/3ML) 0.083% nebulizer solution 5 mg (5 mg Nebulization Given 04/24/17 1917)     ____________________________________________   INITIAL IMPRESSION / ASSESSMENT AND PLAN / ED COURSE  Pertinent labs & imaging results that were available  during my care of the patient were reviewed by me and considered in my medical decision making (see chart for details).  Review of the [place] CSRS was performed in accordance of the [facility] prior to dispensing any controlled drugs.     Patient's diagnosis is consistent with Asthma exacerbation from viral nasopharyngitis. Patient has had cold like symptoms for several days and has increased her asthma symptoms. Patient is not on daily medication. Patient received albuterol in triage prior to assessment by myself. At this time, lung sounds are clear with no adventitious lung sounds. Patient reports great symptom improvement after albuterol. Patient is given injection of Solu-Medrol and will be discharged home with 5 days of prednisone as well as a rescue inhaler. Patient declines any medications at this time for her viral symptoms as she is managing this with over-the-counter medication. Patient will follow-up with primary care as needed. Patient is given ED precautions to return to the ED for any worsening or new symptoms.     ____________________________________________  FINAL CLINICAL IMPRESSION(S) / ED DIAGNOSES  Final diagnoses:  Mild intermittent asthma with exacerbation  Viral URI      NEW MEDICATIONS STARTED DURING THIS VISIT:  New Prescriptions   ALBUTEROL (PROVENTIL HFA;VENTOLIN HFA) 108 (90 BASE) MCG/ACT INHALER    Inhale 2 puffs into the lungs every 4 (four) hours as needed for wheezing or shortness of breath.   PREDNISONE (DELTASONE) 50 MG TABLET    Take 1 tablet (50 mg total) by mouth daily with breakfast.        This chart was dictated using voice recognition software/Dragon. Despite best efforts to proofread, errors can occur which can change the meaning. Any change was purely unintentional.    Racheal PatchesCuthriell, Valinda Fedie D, PA-C 04/24/17 2013    Minna AntisPaduchowski, Kevin, MD 04/24/17 2312

## 2017-04-27 ENCOUNTER — Encounter: Payer: 59 | Admitting: Obstetrics and Gynecology

## 2017-04-30 NOTE — Addendum Note (Signed)
Addendum  created 04/30/17 1213 by Adolpho Meenach, MD   Sign clinical note    

## 2017-04-30 NOTE — Anesthesia Postprocedure Evaluation (Signed)
Anesthesia Post Note  Patient: Janet Rich  Procedure(s) Performed: Procedure(s) (LRB): ROBOTIC ASSISTED DIAGNOSTIC LAPAROSCOPY,EXCISION AND ABLATION OF ENDOMETRIOSIS,LYSIS OF ADHESIONS (N/A)     Anesthesia Post Evaluation  Last Vitals:  Vitals:   02/18/17 1715 02/18/17 1822  BP: 108/71 136/78  Pulse: 89 99  Resp: 14 16  Temp: 36.5 C 36.8 C    Last Pain:  Vitals:   02/22/17 1026  TempSrc:   PainSc: 2                  Vernor Monnig EDWARD

## 2017-05-05 LAB — CYTOLOGY - PAP: PAP SMEAR: NEGATIVE

## 2017-06-17 ENCOUNTER — Inpatient Hospital Stay (HOSPITAL_COMMUNITY): Payer: 59

## 2017-06-17 ENCOUNTER — Encounter (HOSPITAL_COMMUNITY): Payer: Self-pay | Admitting: *Deleted

## 2017-06-17 ENCOUNTER — Inpatient Hospital Stay (HOSPITAL_COMMUNITY)
Admission: AD | Admit: 2017-06-17 | Discharge: 2017-06-17 | Disposition: A | Discharge status: Home / Self Care | Payer: 59 | Source: Ambulatory Visit | Attending: Obstetrics and Gynecology | Admitting: Obstetrics and Gynecology

## 2017-06-17 DIAGNOSIS — Z833 Family history of diabetes mellitus: Secondary | ICD-10-CM | POA: Diagnosis not present

## 2017-06-17 DIAGNOSIS — Z3A01 Less than 8 weeks gestation of pregnancy: Secondary | ICD-10-CM | POA: Diagnosis not present

## 2017-06-17 DIAGNOSIS — R109 Unspecified abdominal pain: Secondary | ICD-10-CM | POA: Diagnosis present

## 2017-06-17 DIAGNOSIS — O26891 Other specified pregnancy related conditions, first trimester: Secondary | ICD-10-CM | POA: Insufficient documentation

## 2017-06-17 DIAGNOSIS — Z91040 Latex allergy status: Secondary | ICD-10-CM | POA: Diagnosis not present

## 2017-06-17 DIAGNOSIS — Z818 Family history of other mental and behavioral disorders: Secondary | ICD-10-CM | POA: Diagnosis not present

## 2017-06-17 DIAGNOSIS — Z87891 Personal history of nicotine dependence: Secondary | ICD-10-CM | POA: Diagnosis not present

## 2017-06-17 DIAGNOSIS — O9989 Other specified diseases and conditions complicating pregnancy, childbirth and the puerperium: Secondary | ICD-10-CM | POA: Diagnosis not present

## 2017-06-17 DIAGNOSIS — R1030 Lower abdominal pain, unspecified: Secondary | ICD-10-CM

## 2017-06-17 DIAGNOSIS — O219 Vomiting of pregnancy, unspecified: Secondary | ICD-10-CM | POA: Insufficient documentation

## 2017-06-17 LAB — URINALYSIS, ROUTINE W REFLEX MICROSCOPIC
Bacteria, UA: NONE SEEN
Bilirubin Urine: NEGATIVE
GLUCOSE, UA: NEGATIVE mg/dL
HGB URINE DIPSTICK: NEGATIVE
Ketones, ur: 20 mg/dL — AB
Leukocytes, UA: NEGATIVE
NITRITE: NEGATIVE
Protein, ur: 30 mg/dL — AB
SPECIFIC GRAVITY, URINE: 1.026 (ref 1.005–1.030)
pH: 6 (ref 5.0–8.0)

## 2017-06-17 LAB — OB RESULTS CONSOLE HEPATITIS B SURFACE ANTIGEN: Hepatitis B Surface Ag: NEGATIVE

## 2017-06-17 LAB — CBC
HEMATOCRIT: 35.2 % — AB (ref 36.0–46.0)
Hemoglobin: 11.6 g/dL — ABNORMAL LOW (ref 12.0–15.0)
MCH: 26 pg (ref 26.0–34.0)
MCHC: 33 g/dL (ref 30.0–36.0)
MCV: 78.9 fL (ref 78.0–100.0)
PLATELETS: 361 10*3/uL (ref 150–400)
RBC: 4.46 MIL/uL (ref 3.87–5.11)
RDW: 16.5 % — AB (ref 11.5–15.5)
WBC: 10.7 10*3/uL — AB (ref 4.0–10.5)

## 2017-06-17 LAB — OB RESULTS CONSOLE GC/CHLAMYDIA
Chlamydia: NEGATIVE
GC PROBE AMP, GENITAL: NEGATIVE

## 2017-06-17 LAB — CYSTIC FIBROSIS DIAGNOSTIC STUDY: INTERPRETATION-CFDNA: NEGATIVE

## 2017-06-17 LAB — OB RESULTS CONSOLE RUBELLA ANTIBODY, IGM: Rubella: IMMUNE

## 2017-06-17 LAB — OB RESULTS CONSOLE ANTIBODY SCREEN: ANTIBODY SCREEN: NEGATIVE

## 2017-06-17 LAB — HCG, QUANTITATIVE, PREGNANCY: hCG, Beta Chain, Quant, S: 3472 m[IU]/mL — ABNORMAL HIGH (ref <5)

## 2017-06-17 LAB — POCT PREGNANCY, URINE: PREG TEST UR: POSITIVE — AB

## 2017-06-17 LAB — OB RESULTS CONSOLE HIV ANTIBODY (ROUTINE TESTING): HIV: NONREACTIVE

## 2017-06-17 LAB — OB RESULTS CONSOLE RPR: RPR: NONREACTIVE

## 2017-06-17 MED ORDER — ACETAMINOPHEN 500 MG PO TABS
1000.0000 mg | ORAL_TABLET | Freq: Once | ORAL | Status: AC
  Administered 2017-06-17: 1000 mg via ORAL
  Filled 2017-06-17: qty 2
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
## 2017-06-17 NOTE — Discharge Instructions (Signed)
First Trimester of Pregnancy The first trimester of pregnancy is from week 1 until the end of week 13 (months 1 through 3). A week after a sperm fertilizes an egg, the egg will implant on the wall of the uterus. This embryo will begin to develop into a baby. Genes from you and your partner will form the baby. The female genes will determine whether the baby will be a boy or a girl. At 6-8 weeks, the eyes and face will be formed, and the heartbeat can be seen on ultrasound. At the end of 12 weeks, all the baby's organs will be formed. Now that you are pregnant, you will want to do everything you can to have a healthy baby. Two of the most important things are to get good prenatal care and to follow your health care provider's instructions. Prenatal care is all the medical care you receive before the baby's birth. This care will help prevent, find, and treat any problems during the pregnancy and childbirth. Body changes during your first trimester Your body goes through many changes during pregnancy. The changes vary from woman to woman.  You may gain or lose a couple of pounds at first.  You may feel sick to your stomach (nauseous) and you may throw up (vomit). If the vomiting is uncontrollable, call your health care provider.  You may tire easily.  You may develop headaches that can be relieved by medicines. All medicines should be approved by your health care provider.  You may urinate more often. Painful urination may mean you have a bladder infection.  You may develop heartburn as a result of your pregnancy.  You may develop constipation because certain hormones are causing the muscles that push stool through your intestines to slow down.  You may develop hemorrhoids or swollen veins (varicose veins).  Your breasts may begin to grow larger and become tender. Your nipples may stick out more, and the tissue that surrounds them (areola) may become darker.  Your gums may bleed and may be  sensitive to brushing and flossing.  Dark spots or blotches (chloasma, mask of pregnancy) may develop on your face. This will likely fade after the baby is born.  Your menstrual periods will stop.  You may have a loss of appetite.  You may develop cravings for certain kinds of food.  You may have changes in your emotions from day to day, such as being excited to be pregnant or being concerned that something may go wrong with the pregnancy and baby.  You may have more vivid and strange dreams.  You may have changes in your hair. These can include thickening of your hair, rapid growth, and changes in texture. Some women also have hair loss during or after pregnancy, or hair that feels dry or thin. Your hair will most likely return to normal after your baby is born.  What to expect at prenatal visits During a routine prenatal visit:  You will be weighed to make sure you and the baby are growing normally.  Your blood pressure will be taken.  Your abdomen will be measured to track your baby's growth.  The fetal heartbeat will be listened to between weeks 10 and 14 of your pregnancy.  Test results from any previous visits will be discussed.  Your health care provider may ask you:  How you are feeling.  If you are feeling the baby move.  If you have had any abnormal symptoms, such as leaking fluid, bleeding, severe headaches,   or abdominal cramping.  If you are using any tobacco products, including cigarettes, chewing tobacco, and electronic cigarettes.  If you have any questions.  Other tests that may be performed during your first trimester include:  Blood tests to find your blood type and to check for the presence of any previous infections. The tests will also be used to check for low iron levels (anemia) and protein on red blood cells (Rh antibodies). Depending on your risk factors, or if you previously had diabetes during pregnancy, you may have tests to check for high blood  sugar that affects pregnant women (gestational diabetes).  Urine tests to check for infections, diabetes, or protein in the urine.  An ultrasound to confirm the proper growth and development of the baby.  Fetal screens for spinal cord problems (spina bifida) and Down syndrome.  HIV (human immunodeficiency virus) testing. Routine prenatal testing includes screening for HIV, unless you choose not to have this test.  You may need other tests to make sure you and the baby are doing well.  Follow these instructions at home: Medicines  Follow your health care provider's instructions regarding medicine use. Specific medicines may be either safe or unsafe to take during pregnancy.  Take a prenatal vitamin that contains at least 600 micrograms (mcg) of folic acid.  If you develop constipation, try taking a stool softener if your health care provider approves. Eating and drinking  Eat a balanced diet that includes fresh fruits and vegetables, whole grains, good sources of protein such as meat, eggs, or tofu, and low-fat dairy. Your health care provider will help you determine the amount of weight gain that is right for you.  Avoid raw meat and uncooked cheese. These carry germs that can cause birth defects in the baby.  Eating four or five small meals rather than three large meals a day may help relieve nausea and vomiting. If you start to feel nauseous, eating a few soda crackers can be helpful. Drinking liquids between meals, instead of during meals, also seems to help ease nausea and vomiting.  Limit foods that are high in fat and processed sugars, such as fried and sweet foods.  To prevent constipation: ? Eat foods that are high in fiber, such as fresh fruits and vegetables, whole grains, and beans. ? Drink enough fluid to keep your urine clear or pale yellow. Activity  Exercise only as directed by your health care provider. Most women can continue their usual exercise routine during  pregnancy. Try to exercise for 30 minutes at least 5 days a week. Exercising will help you: ? Control your weight. ? Stay in shape. ? Be prepared for labor and delivery.  Experiencing pain or cramping in the lower abdomen or lower back is a good sign that you should stop exercising. Check with your health care provider before continuing with normal exercises.  Try to avoid standing for long periods of time. Move your legs often if you must stand in one place for a long time.  Avoid heavy lifting.  Wear low-heeled shoes and practice good posture.  You may continue to have sex unless your health care provider tells you not to. Relieving pain and discomfort  Wear a good support bra to relieve breast tenderness.  Take warm sitz baths to soothe any pain or discomfort caused by hemorrhoids. Use hemorrhoid cream if your health care provider approves.  Rest with your legs elevated if you have leg cramps or low back pain.  If you develop   varicose veins in your legs, wear support hose. Elevate your feet for 15 minutes, 3-4 times a day. Limit salt in your diet. Prenatal care  Schedule your prenatal visits by the twelfth week of pregnancy. They are usually scheduled monthly at first, then more often in the last 2 months before delivery.  Write down your questions. Take them to your prenatal visits.  Keep all your prenatal visits as told by your health care provider. This is important. Safety  Wear your seat belt at all times when driving.  Make a list of emergency phone numbers, including numbers for family, friends, the hospital, and police and fire departments. General instructions  Ask your health care provider for a referral to a local prenatal education class. Begin classes no later than the beginning of month 6 of your pregnancy.  Ask for help if you have counseling or nutritional needs during pregnancy. Your health care provider can offer advice or refer you to specialists for help  with various needs.  Do not use hot tubs, steam rooms, or saunas.  Do not douche or use tampons or scented sanitary pads.  Do not cross your legs for long periods of time.  Avoid cat litter boxes and soil used by cats. These carry germs that can cause birth defects in the baby and possibly loss of the fetus by miscarriage or stillbirth.  Avoid all smoking, herbs, alcohol, and medicines not prescribed by your health care provider. Chemicals in these products affect the formation and growth of the baby.  Do not use any products that contain nicotine or tobacco, such as cigarettes and e-cigarettes. If you need help quitting, ask your health care provider. You may receive counseling support and other resources to help you quit.  Schedule a dentist appointment. At home, brush your teeth with a soft toothbrush and be gentle when you floss. Contact a health care provider if:  You have dizziness.  You have mild pelvic cramps, pelvic pressure, or nagging pain in the abdominal area.  You have persistent nausea, vomiting, or diarrhea.  You have a bad smelling vaginal discharge.  You have pain when you urinate.  You notice increased swelling in your face, hands, legs, or ankles.  You are exposed to fifth disease or chickenpox.  You are exposed to German measles (rubella) and have never had it. Get help right away if:  You have a fever.  You are leaking fluid from your vagina.  You have spotting or bleeding from your vagina.  You have severe abdominal cramping or pain.  You have rapid weight gain or loss.  You vomit blood or material that looks like coffee grounds.  You develop a severe headache.  You have shortness of breath.  You have any kind of trauma, such as from a fall or a car accident. Summary  The first trimester of pregnancy is from week 1 until the end of week 13 (months 1 through 3).  Your body goes through many changes during pregnancy. The changes vary from  woman to woman.  You will have routine prenatal visits. During those visits, your health care provider will examine you, discuss any test results you may have, and talk with you about how you are feeling. This information is not intended to replace advice given to you by your health care provider. Make sure you discuss any questions you have with your health care provider. Document Released: 09/29/2001 Document Revised: 09/16/2016 Document Reviewed: 09/16/2016 Elsevier Interactive Patient Education  2017 [employer]  
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
## 2017-06-18 LAB — ABO/RH: ABO/RH(D): A POS

## 2017-07-03 ENCOUNTER — Other Ambulatory Visit: Payer: Self-pay | Admitting: Obstetrics and Gynecology

## 2017-07-13 ENCOUNTER — Inpatient Hospital Stay (HOSPITAL_COMMUNITY)
Admission: AD | Admit: 2017-07-13 | Discharge: 2017-07-13 | Disposition: A | Discharge status: Home / Self Care | Payer: 59 | Source: Ambulatory Visit | Attending: Obstetrics and Gynecology | Admitting: Obstetrics and Gynecology

## 2017-07-13 ENCOUNTER — Encounter (HOSPITAL_COMMUNITY): Payer: Self-pay | Admitting: *Deleted

## 2017-07-13 DIAGNOSIS — Z3A09 9 weeks gestation of pregnancy: Secondary | ICD-10-CM | POA: Insufficient documentation

## 2017-07-13 DIAGNOSIS — O210 Mild hyperemesis gravidarum: Secondary | ICD-10-CM | POA: Diagnosis not present

## 2017-07-13 DIAGNOSIS — Z87891 Personal history of nicotine dependence: Secondary | ICD-10-CM | POA: Insufficient documentation

## 2017-07-13 DIAGNOSIS — O26891 Other specified pregnancy related conditions, first trimester: Secondary | ICD-10-CM | POA: Diagnosis present

## 2017-07-13 DIAGNOSIS — R112 Nausea with vomiting, unspecified: Secondary | ICD-10-CM | POA: Diagnosis present

## 2017-07-13 LAB — URINALYSIS, ROUTINE W REFLEX MICROSCOPIC
Bilirubin Urine: NEGATIVE
GLUCOSE, UA: NEGATIVE mg/dL
Hgb urine dipstick: NEGATIVE
KETONES UR: NEGATIVE mg/dL
LEUKOCYTES UA: NEGATIVE
NITRITE: NEGATIVE
PH: 6 (ref 5.0–8.0)
PROTEIN: NEGATIVE mg/dL
Specific Gravity, Urine: 1.011 (ref 1.005–1.030)

## 2017-07-13 MED ORDER — PROMETHAZINE HCL 25 MG PO TABS: 25.0000 mg | ORAL_TABLET | Freq: Four times a day (QID) | ORAL | 3 refills | Status: DC | PRN

## 2017-07-13 MED ORDER — M.V.I. ADULT IV INJ
Freq: Once | INTRAVENOUS | Status: AC
  Administered 2017-07-13: 21:00:00 via INTRAVENOUS
  Filled 2017-07-13: qty 1000

## 2017-07-13 MED ORDER — PROMETHAZINE HCL 25 MG/ML IJ SOLN
25.0000 mg | Freq: Once | INTRAMUSCULAR | Status: AC
  Administered 2017-07-13: 25 mg via INTRAVENOUS
  Filled 2017-07-13: qty 1

## 2017-07-13 NOTE — MAU Note (Signed)
+  N/V--emesis x2 in the past 24 hours Has tried reglan; has helped; no vomiting today but still nauseous Has tolerated chicken tenders today

## 2017-07-13 NOTE — MAU Provider Note (Signed)
History     CSN: [id number]  Arrival date and time: 07/13/17 1757   First Provider Initiated Contact with Patient 07/13/17 1927      Chief Complaint  Patient presents with  . Emesis  . Nausea   HPI  [name] is a 21 y.o. G1P0000 at [redacted]w[redacted]d gestation presenting to MAU with complaints of N/V x 2 episodes in the past 24 hrs.  She reports taking Reglan; which helped.  She denies vomiting today, but still "very nauseous". She has tolerated chicken tenders today.  She reports feeling "really weak".  Past Medical History:  Diagnosis Date  . Anemia    with periods  . Ankle fracture    right  . Anxiety   . Asthma   . Back pain   . Depression   . Endometriosis   . GERD (gastroesophageal reflux disease)   . Headache   . Ovarian cyst   . Ovarian cyst   . PCOS (polycystic ovarian syndrome)   . Pneumonia    age 49  . PONV (postoperative nausea and vomiting)    nausea after wisdom teeth extraction  . Vaginal Pap smear, abnormal    bx ok    Past Surgical History:  Procedure Laterality Date  . NO PAST SURGERIES    . ROBOTIC ASSISTED DIAGNOSTIC LAPAROSCOPY N/A 02/18/2017   Procedure: ROBOTIC ASSISTED DIAGNOSTIC LAPAROSCOPY,EXCISION AND ABLATION OF ENDOMETRIOSIS,LYSIS OF ADHESIONS;  Surgeon: Olivia Mackie, MD;  Location: [facility];  Service: Gynecology;  Laterality: N/A;  . WISDOM TOOTH EXTRACTION  2013    Family History  Problem Relation Age of Onset  . Endometriosis Mother   . Diabetes Mother   . Breast cancer Paternal Grandmother   . Cancer Paternal Grandmother   . Schizophrenia Father   . Heart disease Neg Hx   . Colon cancer Neg Hx   . Ovarian cancer Neg Hx     Social History  Substance Use Topics  . Smoking status: Former Smoker    Types: Cigarettes  . Smokeless tobacco: Never Used     Comment: age [age], quit  . Alcohol use Yes     Comment: occassionally    Allergies:  Allergies  Allergen Reactions  . Other     Must have anti-emetic prior to being  given any narcotics   . Latex Rash    Prescriptions Prior to Admission  Medication Sig Dispense Refill Last Dose  . acetaminophen (TYLENOL) 500 MG tablet Take 500 mg by mouth every 6 (six) hours as needed for moderate pain.   Past Month at Unknown time  . Docosahexaenoic Acid (DHA COMPLETE PO) Take 1 capsule by mouth at bedtime.   07/12/2017 at Unknown time  . Doxylamine-Pyridoxine ER (BONJESTA) 20-20 MG TBCR Take 20 mg by mouth at bedtime.   07/12/2017 at Unknown time  . metoCLOPramide (REGLAN) 10 MG tablet Take 10 mg by mouth every 8 (eight) hours as needed for nausea or vomiting.    07/13/2017 at Unknown time  . Prenatal MV-Min-FA-Omega-3 (PRENATAL GUMMIES/DHA & FA) 0.4-32.5 MG CHEW Chew 1 each by mouth daily.    07/12/2017 at Unknown time  . albuterol (PROVENTIL HFA;VENTOLIN HFA) 108 (90 Base) MCG/ACT inhaler Inhale 2 puffs into the lungs every 4 (four) hours as needed for wheezing or shortness of breath. (Patient not taking: Reported on 07/13/2017) 1 Inhaler 0 Not Taking at Unknown time  . oxyCODONE-acetaminophen (ROXICET) 5-325 MG tablet Take 1-2 tablets by mouth every 4 (four) hours as needed for severe  pain. (Patient not taking: Reported on 07/13/2017) 10 tablet 0 Not Taking at Unknown time  . predniSONE (DELTASONE) 50 MG tablet Take 1 tablet (50 mg total) by mouth daily with breakfast. (Patient not taking: Reported on 07/13/2017) 5 tablet 0 Not Taking at Unknown time    Review of Systems  Constitutional: Positive for appetite change and fatigue.  HENT: Negative.   Eyes: Negative.   Cardiovascular: Negative.   Gastrointestinal: Positive for nausea and vomiting.  Endocrine: Negative.   Genitourinary: Negative.   Musculoskeletal: Negative.   Skin: Negative.   Allergic/Immunologic: Negative.   Neurological: Positive for weakness.  Hematological: Negative.   Psychiatric/Behavioral: Negative.    Physical Exam   Blood pressure 107/77, pulse 90, temperature 98 F (36.7 C), temperature  source Oral, resp. rate 16, weight 202 lb 0.6 oz (91.6 kg), last menstrual period 05/10/2017, SpO2 100 %.  Physical Exam  Nursing note and vitals reviewed. Constitutional: She is oriented to person, place, and time. She appears well-developed and well-nourished.  HENT:  Head: Normocephalic.  Eyes: Pupils are equal, round, and reactive to light.  Neck: Normal range of motion.  Cardiovascular: Normal rate, regular rhythm and normal heart sounds.   Respiratory: Effort normal and breath sounds normal.  GI: Soft. Bowel sounds are normal.  Genitourinary:  Genitourinary Comments: Pelvic deferred  Musculoskeletal: Normal range of motion.  Neurological: She is alert and oriented to person, place, and time.  Skin: Skin is warm and dry.  Psychiatric: She has a normal mood and affect. Her behavior is normal. Judgment and thought content normal.    MAU Course  Procedures  MDM CCUA IVFs (only to relieve sx's not for dehydration): Phenergan 25 mg in D5LR 1000 ml @ 999 ml/hr; then MVI 1 ampule in LR 1000 ml @ 500 ml/hr -- nausea improved, able to tolerate sips and crackers  *TC to Dr. Billy Coast @ 2205 - notified of patient's complaints, assessments, lab & U/S results, tx plan Rxs for Phenergan and Reglan sent - ok to d/c home, agrees with plan  Results for orders placed or performed during the hospital encounter of 07/13/17 (from the past 24 hour(s))  Urinalysis, Routine w reflex microscopic     Status: None   Collection Time: 07/13/17  6:08 PM  Result Value Ref Range   Color, Urine YELLOW YELLOW   APPearance CLEAR CLEAR   Specific Gravity, Urine 1.011 1.005 - 1.030   pH 6.0 5.0 - 8.0   Glucose, UA NEGATIVE NEGATIVE mg/dL   Hgb urine dipstick NEGATIVE NEGATIVE   Bilirubin Urine NEGATIVE NEGATIVE   Ketones, ur NEGATIVE NEGATIVE mg/dL   Protein, ur NEGATIVE NEGATIVE mg/dL   Nitrite NEGATIVE NEGATIVE   Leukocytes, UA NEGATIVE NEGATIVE    Assessment and Plan  Morning sickness - Continue all  antiemetic medications as prescribed; use Phenergan when nothing else is working - Rx Phenergan 25 mg tablets PO every 6 hrs prn sent - F/U with [facility] as scheduled or prn Discharge home Patient verbalized an understanding of the plan of care and agrees.   Raelyn Mora, MSN, CNM 07/13/2017, 7:20 PM

## 2017-09-07 ENCOUNTER — Emergency Department: Payer: 59

## 2017-09-07 ENCOUNTER — Emergency Department
Admission: EM | Admit: 2017-09-07 | Discharge: 2017-09-08 | Disposition: A | Discharge status: Home / Self Care | Payer: 59 | Attending: Emergency Medicine | Admitting: Emergency Medicine

## 2017-09-07 ENCOUNTER — Other Ambulatory Visit: Payer: Self-pay

## 2017-09-07 ENCOUNTER — Encounter: Payer: Self-pay | Admitting: Emergency Medicine

## 2017-09-07 DIAGNOSIS — F329 Major depressive disorder, single episode, unspecified: Secondary | ICD-10-CM | POA: Insufficient documentation

## 2017-09-07 DIAGNOSIS — F419 Anxiety disorder, unspecified: Secondary | ICD-10-CM

## 2017-09-07 DIAGNOSIS — Z87891 Personal history of nicotine dependence: Secondary | ICD-10-CM | POA: Diagnosis not present

## 2017-09-07 DIAGNOSIS — Z3A16 16 weeks gestation of pregnancy: Secondary | ICD-10-CM | POA: Diagnosis not present

## 2017-09-07 DIAGNOSIS — O99342 Other mental disorders complicating pregnancy, second trimester: Secondary | ICD-10-CM | POA: Insufficient documentation

## 2017-09-07 DIAGNOSIS — J45909 Unspecified asthma, uncomplicated: Secondary | ICD-10-CM | POA: Insufficient documentation

## 2017-09-07 DIAGNOSIS — Z79899 Other long term (current) drug therapy: Secondary | ICD-10-CM | POA: Insufficient documentation

## 2017-09-07 DIAGNOSIS — Z91040 Latex allergy status: Secondary | ICD-10-CM | POA: Insufficient documentation

## 2017-09-07 DIAGNOSIS — O99512 Diseases of the respiratory system complicating pregnancy, second trimester: Secondary | ICD-10-CM | POA: Diagnosis not present

## 2017-09-07 DIAGNOSIS — F32A Depression, unspecified: Secondary | ICD-10-CM

## 2017-09-07 LAB — COMPREHENSIVE METABOLIC PANEL
ALBUMIN: 3.3 g/dL — AB (ref 3.5–5.0)
ALT: 10 U/L — ABNORMAL LOW (ref 14–54)
ANION GAP: 9 (ref 5–15)
AST: 16 U/L (ref 15–41)
Alkaline Phosphatase: 63 U/L (ref 38–126)
BUN: 6 mg/dL (ref 6–20)
CO2: 25 mmol/L (ref 22–32)
Calcium: 9.1 mg/dL (ref 8.9–10.3)
Chloride: 101 mmol/L (ref 101–111)
Creatinine, Ser: 0.53 mg/dL (ref 0.44–1.00)
GFR calc Af Amer: >60 mL/min (ref >60)
GFR calc non Af Amer: >60 mL/min (ref >60)
GLUCOSE: 99 mg/dL (ref 65–99)
POTASSIUM: 3.5 mmol/L (ref 3.5–5.1)
Sodium: 135 mmol/L (ref 135–145)
TOTAL PROTEIN: 7 g/dL (ref 6.5–8.1)

## 2017-09-07 LAB — CBC
HCT: 35 % (ref 35.0–47.0)
HEMOGLOBIN: 11.8 g/dL — AB (ref 12.0–16.0)
MCH: 26.1 pg (ref 26.0–34.0)
MCHC: 33.6 g/dL (ref 32.0–36.0)
MCV: 77.5 fL — AB (ref 80.0–100.0)
PLATELETS: 360 10*3/uL (ref 150–440)
RBC: 4.52 MIL/uL (ref 3.80–5.20)
RDW: 14.7 % — ABNORMAL HIGH (ref 11.5–14.5)
WBC: 11.2 10*3/uL — ABNORMAL HIGH (ref 3.6–11.0)

## 2017-09-07 LAB — HCG, QUANTITATIVE, PREGNANCY: HCG, BETA CHAIN, QUANT, S: 16456 m[IU]/mL — AB (ref <5)

## 2017-09-07 NOTE — ED Triage Notes (Addendum)
Patient ambulatory to triage with steady gait, without difficulty or distress noted; pt reports having panic attacks; st was taking seroquel and lamictal; stopped yr ago due to her fertility procedures to get pregnant and currently 16wks pregant and has had increased symptoms since pregnancy; G1 EDC 5/3, no complications; st that got a dog to help her but it attacked her grandmother today and caused her to have abd cramping and then panic attack; pt reports that she has had cramping intermittently throughout her pregnancy and especially when she is upset; pt denies symptoms at present

## 2017-09-07 NOTE — ED Notes (Signed)
Unable to obtain FHTs at this time, as attempted by this nurse and 2nd RN; pt reports that sometimes there are issues with obtaining them because she has a tilted uterus, Dr Mayford KnifeWilliams notified and orders obtained;

## 2017-09-08 LAB — URINALYSIS, COMPLETE (UACMP) WITH MICROSCOPIC
BACTERIA UA: NONE SEEN
BILIRUBIN URINE: NEGATIVE
Glucose, UA: NEGATIVE mg/dL
HGB URINE DIPSTICK: NEGATIVE
KETONES UR: NEGATIVE mg/dL
Nitrite: NEGATIVE
Protein, ur: NEGATIVE mg/dL
SPECIFIC GRAVITY, URINE: 1.024 (ref 1.005–1.030)
pH: 6 (ref 5.0–8.0)

## 2017-09-08 MED ORDER — SERTRALINE HCL 50 MG PO TABS
50.0000 mg | ORAL_TABLET | Freq: Every day | ORAL | 0 refills | Status: DC
Start: 2017-09-08 — End: 2017-11-28

## 2017-09-08 MED ORDER — LAMOTRIGINE 25 MG PO TABS: 25.0000 mg | ORAL_TABLET | Freq: Every day | ORAL | 0 refills | Status: DC

## 2017-09-08 NOTE — ED Provider Notes (Signed)
[facility] Emergency Department Provider Note  ____________________________________________   First MD Initiated Contact with Patient 09/08/17 0000     (approximate)  I have reviewed the triage vital signs and the nursing notes.   HISTORY  Chief Complaint Panic Attack    HPI Janet Rich is a 21 y.o. female with the below list of chronic medical including anxiety and depression conditions presents to the emergency department with feelings of anxiety.  Patient states that she discontinued taking Lamictal and Seroquel approximately 1 year ago when she began trying to become pregnant.  Patient states that she is noted increasing feelings of depression and anxiety over the course of the year.  She states that today her dog attacked her grandmother's dog which precipitated a panic attack.  Patient denies any suicidal or homicidal ideations.   Past Medical History:  Diagnosis Date  . Anemia    with periods  . Ankle fracture    right  . Anxiety   . Asthma   . Back pain   . Depression   . Endometriosis   . GERD (gastroesophageal reflux disease)   . Headache   . Ovarian cyst   . Ovarian cyst   . PCOS (polycystic ovarian syndrome)   . Pneumonia    age 89  . PONV (postoperative nausea and vomiting)    nausea after wisdom teeth extraction  . Vaginal Pap smear, abnormal    bx ok    Patient Active Problem List   Diagnosis Date Noted  . Morning sickness 07/13/2017  . Dysmenorrhea 05/01/2015  . History of migraine headaches 05/01/2015  . Anxiety and depression 05/01/2015  . Increased BMI (body mass index) 05/01/2015  . Family history of endometriosis in first degree relative 05/01/2015    Past Surgical History:  Procedure Laterality Date  . NO PAST SURGERIES    . ROBOTIC ASSISTED DIAGNOSTIC LAPAROSCOPY N/A 02/18/2017   Procedure: ROBOTIC ASSISTED DIAGNOSTIC LAPAROSCOPY,EXCISION AND ABLATION OF ENDOMETRIOSIS,LYSIS OF ADHESIONS;  Surgeon: Olivia Mackieichard  Taavon, MD;  Location: [facility];  Service: Gynecology;  Laterality: N/A;  . WISDOM TOOTH EXTRACTION  2013    Prior to Admission medications   Medication Sig Start Date End Date Taking? Authorizing Provider  acetaminophen (TYLENOL) 500 MG tablet Take 500 mg by mouth every 6 (six) hours as needed for moderate pain.    [provider]  albuterol (PROVENTIL HFA;VENTOLIN HFA) 108 (90 Base) MCG/ACT inhaler Inhale 2 puffs into the lungs every 4 (four) hours as needed for wheezing or shortness of breath. Patient not taking: Reported on 07/13/2017 04/24/17   Cuthriell, Delorise RoyalsJonathan D, PA-C  Docosahexaenoic Acid (DHA COMPLETE PO) Take 1 capsule by mouth at bedtime.    [provider]  Doxylamine-Pyridoxine ER (BONJESTA) 20-20 MG TBCR Take 20 mg by mouth at bedtime.    [provider]  metoCLOPramide (REGLAN) 10 MG tablet Take 10 mg by mouth every 8 (eight) hours as needed for nausea or vomiting.  07/12/17   [provider]  oxyCODONE-acetaminophen (ROXICET) 5-325 MG tablet Take 1-2 tablets by mouth every 4 (four) hours as needed for severe pain. Patient not taking: Reported on 07/13/2017 02/25/17   Leftwich-Kirby, Wilmer FloorLisa A, CNM  predniSONE (DELTASONE) 50 MG tablet Take 1 tablet (50 mg total) by mouth daily with breakfast. Patient not taking: Reported on 07/13/2017 04/24/17   Cuthriell, Delorise RoyalsJonathan D, PA-C  Prenatal MV-Min-FA-Omega-3 (PRENATAL GUMMIES/DHA & FA) 0.4-32.5 MG CHEW Chew 1 each by mouth daily.     [provider]  promethazine (PHENERGAN) 25 MG tablet Take 1 tablet (25 mg total) by mouth every 6 (six) hours as needed for nausea or vomiting. 07/13/17   Raelyn Mora, CNM    Allergies Other and Latex  Family History  Problem Relation Age of Onset  . Endometriosis Mother   . Diabetes Mother   . Breast cancer Paternal Grandmother   . Cancer Paternal Grandmother   . Schizophrenia Father   . Heart disease Neg Hx   . Colon cancer Neg Hx   . Ovarian cancer Neg Hx       Social History Social History   Tobacco Use  . Smoking status: Former Smoker    Types: Cigarettes  . Smokeless tobacco: Never Used  . Tobacco comment: age [age], quit  Substance Use Topics  . Alcohol use: Yes    Comment: occassionally  . Drug use: No    Review of Systems Constitutional: No fever/chills Eyes: No visual changes. ENT: No sore throat. Cardiovascular: Denies chest pain. Respiratory: Denies shortness of breath. Gastrointestinal: No abdominal pain.  No nausea, no vomiting.  No diarrhea.  No constipation. Genitourinary: Negative for dysuria. Musculoskeletal: Negative for neck pain.  Negative for back pain. Integumentary: Negative for rash. Neurological: Negative for headaches, focal weakness or numbness. Psychiatric:Positive for anxiety and depression  ____________________________________________   PHYSICAL EXAM:  VITAL SIGNS: ED Triage Vitals [09/07/17 2020]  Enc Vitals Group     BP 133/81     Pulse Rate (!) 108     Resp 18     Temp 98 F (36.7 C)     Temp src      SpO2 97 %     Weight 92.5 kg (204 lb)     Height 1.549 m (5\' 1" )     Head Circumference      Peak Flow      Pain Score      Pain Loc      Pain Edu?      Excl. in GC?     Constitutional: Alert and oriented. Well appearing and in no acute distress. Eyes: Conjunctivae are normal.  Head: Atraumatic. Mouth/Throat: Mucous membranes are moist. Neck: No stridor.   Cardiovascular: Normal rate, regular rhythm. Good peripheral circulation. Grossly normal heart sounds. Respiratory: Normal respiratory effort.  No retractions. Lungs CTAB. Gastrointestinal: Soft and nontender. No distention.  Musculoskeletal: No lower extremity tenderness nor edema. No gross deformities of extremities. Neurologic:  Normal speech and language. No gross focal neurologic deficits are appreciated.  Skin:  Skin is warm, dry and intact. No rash noted. Psychiatric: Mood and affect are normal. Speech and behavior  are normal.  ____________________________________________   LABS (all labs ordered are listed, but only abnormal results are displayed)  Labs Reviewed  CBC - Abnormal; Notable for the following components:      Result Value   WBC 11.2 (*)    Hemoglobin 11.8 (*)    MCV 77.5 (*)    RDW 14.7 (*)    All other components within normal limits  COMPREHENSIVE METABOLIC PANEL - Abnormal; Notable for the following components:   Albumin 3.3 (*)    ALT 10 (*)    Total Bilirubin <0.1 (*)    All other components within normal limits  HCG, QUANTITATIVE, PREGNANCY - Abnormal; Notable for the following components:   hCG, Beta Chain, Quant, S 16,456 (*)    All other components within normal limits  URINALYSIS, COMPLETE (UACMP) WITH MICROSCOPIC - Abnormal; Notable for the following  components:   Color, Urine YELLOW (*)    APPearance HAZY (*)    Leukocytes, UA TRACE (*)    Squamous Epithelial / LPF 6-30 (*)    All other components within normal limits    RADIOLOGY I, Crittenden N Rosalynd Mcwright, personally viewed and evaluated these images (plain radiographs) as part of my medical decision making, as well as reviewing the written report by the radiologist.  [place] Ob Limited  Result Date: 09/07/2017 CLINICAL DATA:  Diffuse abdominal pain, unable to detect fetal heart activity. EXAM: LIMITED OBSTETRIC ULTRASOUND FINDINGS: Number of Fetuses: 1 Heart Rate:  152 bpm Movement: Yes Presentation: Breech Placental Location: Anterior Previa: No Amniotic Fluid (Subjective):  Subjectively low. BPD:  3.83cm 17w  5d MATERNAL FINDINGS: Cervix:  Appears closed. Uterus/Adnexae: No abnormality visualized. IMPRESSION: 1. Single live intrauterine gestation at 17 weeks 5 days. Heart rate 152 beats per minute. 2. Subjectively low amniotic fluid.  Closed cervix noted however. This exam is performed on an emergent basis and does not comprehensively evaluate fetal size, dating, or anatomy; follow-up complete OB US should be considered if  further fetal assessment is warranted. Electronically Signed   By: Tollie Ethavid  Kwon M.D.   On: 09/07/2017 21:28      Procedures   ____________________________________________   INITIAL IMPRESSION / ASSESSMENT AND PLAN / ED COURSE  As part of my medical decision making, I reviewed the following data within the electronic MEDICAL RECORD [age] year old female [redacted] weeks pregnant presenting to the emergency department feelings of depression and anxiety.  Spoke with the patient at length regarding anxiety and depression during pregnancy and medication management.  The patient requested Zoloft I explained to the patient the potential adverse reactions and pregnancy associated with Zoloft however she stated that she would like to be prescribed Zoloft and as such I obliged. ____________________________________________  FINAL CLINICAL IMPRESSION(S) / ED DIAGNOSES  Final diagnoses:  Depression, unspecified depression type  Anxiety     MEDICATIONS GIVEN DURING THIS VISIT:  Medications - No data to display   ED Discharge Orders    None       Note:  This document was prepared using Dragon voice recognition software and may include unintentional dictation errors.    Darci CurrentBrown, [place] N, MD 09/08/17 0157

## 2017-09-08 NOTE — [facility] Counselor (Signed)
The patient [facility].  She was also given information for [facility] and [facility].  The counselor practiced breathing skills with the patient.

## 2017-09-15 ENCOUNTER — Encounter (HOSPITAL_COMMUNITY): Payer: Self-pay | Admitting: *Deleted

## 2017-09-15 ENCOUNTER — Inpatient Hospital Stay (HOSPITAL_COMMUNITY)
Admission: AD | Admit: 2017-09-15 | Discharge: 2017-09-15 | Disposition: A | Discharge status: Home / Self Care | Payer: 59 | Source: Ambulatory Visit | Attending: Obstetrics and Gynecology | Admitting: Obstetrics and Gynecology

## 2017-09-15 DIAGNOSIS — Z8781 Personal history of (healed) traumatic fracture: Secondary | ICD-10-CM | POA: Insufficient documentation

## 2017-09-15 DIAGNOSIS — O26892 Other specified pregnancy related conditions, second trimester: Secondary | ICD-10-CM | POA: Diagnosis present

## 2017-09-15 DIAGNOSIS — F329 Major depressive disorder, single episode, unspecified: Secondary | ICD-10-CM | POA: Diagnosis not present

## 2017-09-15 DIAGNOSIS — O99342 Other mental disorders complicating pregnancy, second trimester: Secondary | ICD-10-CM | POA: Diagnosis not present

## 2017-09-15 DIAGNOSIS — Z3A17 17 weeks gestation of pregnancy: Secondary | ICD-10-CM | POA: Diagnosis not present

## 2017-09-15 DIAGNOSIS — Z87891 Personal history of nicotine dependence: Secondary | ICD-10-CM | POA: Diagnosis not present

## 2017-09-15 DIAGNOSIS — F419 Anxiety disorder, unspecified: Secondary | ICD-10-CM | POA: Insufficient documentation

## 2017-09-15 DIAGNOSIS — O210 Mild hyperemesis gravidarum: Secondary | ICD-10-CM

## 2017-09-15 DIAGNOSIS — R102 Pelvic and perineal pain: Secondary | ICD-10-CM | POA: Insufficient documentation

## 2017-09-15 DIAGNOSIS — Z91040 Latex allergy status: Secondary | ICD-10-CM | POA: Diagnosis not present

## 2017-09-15 DIAGNOSIS — Z833 Family history of diabetes mellitus: Secondary | ICD-10-CM | POA: Diagnosis not present

## 2017-09-15 DIAGNOSIS — O99282 Endocrine, nutritional and metabolic diseases complicating pregnancy, second trimester: Secondary | ICD-10-CM | POA: Insufficient documentation

## 2017-09-15 DIAGNOSIS — E282 Polycystic ovarian syndrome: Secondary | ICD-10-CM | POA: Diagnosis not present

## 2017-09-15 DIAGNOSIS — J45909 Unspecified asthma, uncomplicated: Secondary | ICD-10-CM | POA: Insufficient documentation

## 2017-09-15 DIAGNOSIS — Z79899 Other long term (current) drug therapy: Secondary | ICD-10-CM | POA: Insufficient documentation

## 2017-09-15 DIAGNOSIS — N949 Unspecified condition associated with female genital organs and menstrual cycle: Secondary | ICD-10-CM | POA: Diagnosis not present

## 2017-09-15 DIAGNOSIS — Z842 Family history of other diseases of the genitourinary system: Secondary | ICD-10-CM | POA: Insufficient documentation

## 2017-09-15 DIAGNOSIS — O99512 Diseases of the respiratory system complicating pregnancy, second trimester: Secondary | ICD-10-CM | POA: Insufficient documentation

## 2017-09-15 LAB — URINALYSIS, ROUTINE W REFLEX MICROSCOPIC
Bilirubin Urine: NEGATIVE
Glucose, UA: NEGATIVE mg/dL
Hgb urine dipstick: NEGATIVE
KETONES UR: NEGATIVE mg/dL
LEUKOCYTES UA: NEGATIVE
NITRITE: NEGATIVE
Protein, ur: NEGATIVE mg/dL
Specific Gravity, Urine: 1.012 (ref 1.005–1.030)
pH: 7 (ref 5.0–8.0)

## 2017-09-15 LAB — WET PREP, GENITAL
CLUE CELLS WET PREP: NONE SEEN
SPERM: NONE SEEN
TRICH WET PREP: NONE SEEN
YEAST WET PREP: NONE SEEN

## 2017-09-15 NOTE — MAU Provider Note (Signed)
History     CSN: [id number]  Arrival date and time: 09/15/17 [id number]   First Provider Initiated Contact with Patient 09/15/17 [phone]      Chief Complaint  Patient presents with  . Pelvic Pain   HPI  Janet Rich is a 21 y.o. G1P0000 at 2031w5d gestation presenting to MAU with complaints of pelvic pressure since yesterday. She states the pressure worsened at 1900. She also complains of nausea and watery stool since yesterday and some watery vaginal discharge.  Past Medical History:  Diagnosis Date  . Anemia    with periods  . Ankle fracture    right  . Anxiety   . Asthma   . Back pain   . Depression   . Endometriosis   . GERD (gastroesophageal reflux disease)   . Headache   . Ovarian cyst   . Ovarian cyst   . PCOS (polycystic ovarian syndrome)   . Pneumonia    age 89  . PONV (postoperative nausea and vomiting)    nausea after wisdom teeth extraction  . Vaginal Pap smear, abnormal    bx ok    Past Surgical History:  Procedure Laterality Date  . NO PAST SURGERIES    . ROBOTIC ASSISTED DIAGNOSTIC LAPAROSCOPY N/A 02/18/2017   Procedure: ROBOTIC ASSISTED DIAGNOSTIC LAPAROSCOPY,EXCISION AND ABLATION OF ENDOMETRIOSIS,LYSIS OF ADHESIONS;  Surgeon: Olivia Mackieichard Taavon, MD;  Location: [facility];  Service: Gynecology;  Laterality: N/A;  . WISDOM TOOTH EXTRACTION  2013    Family History  Problem Relation Age of Onset  . Endometriosis Mother   . Diabetes Mother   . Breast cancer Paternal Grandmother   . Cancer Paternal Grandmother   . Schizophrenia Father   . Heart disease Neg Hx   . Colon cancer Neg Hx   . Ovarian cancer Neg Hx     Social History   Tobacco Use  . Smoking status: Former Smoker    Types: Cigarettes  . Smokeless tobacco: Never Used  . Tobacco comment: age [age], quit  Substance Use Topics  . Alcohol use: Yes    Comment: occassionally  . Drug use: No    Allergies:  Allergies  Allergen Reactions  . Other     Must have anti-emetic prior to being given  any narcotics   . Latex Rash    Medications Prior to Admission  Medication Sig Dispense Refill Last Dose  . acetaminophen (TYLENOL) 500 MG tablet Take 500 mg by mouth every 6 (six) hours as needed for moderate pain.   Past Month at Unknown time  . albuterol (PROVENTIL HFA;VENTOLIN HFA) 108 (90 Base) MCG/ACT inhaler Inhale 2 puffs into the lungs every 4 (four) hours as needed for wheezing or shortness of breath. (Patient not taking: Reported on 07/13/2017) 1 Inhaler 0 Not Taking at Unknown time  . Docosahexaenoic Acid (DHA COMPLETE PO) Take 1 capsule by mouth at bedtime.   07/12/2017 at Unknown time  . Doxylamine-Pyridoxine ER (BONJESTA) 20-20 MG TBCR Take 20 mg by mouth at bedtime.   07/12/2017 at Unknown time  . metoCLOPramide (REGLAN) 10 MG tablet Take 10 mg by mouth every 8 (eight) hours as needed for nausea or vomiting.    07/13/2017 at Unknown time  . oxyCODONE-acetaminophen (ROXICET) 5-325 MG tablet Take 1-2 tablets by mouth every 4 (four) hours as needed for severe pain. (Patient not taking: Reported on 07/13/2017) 10 tablet 0 Not Taking at Unknown time  . predniSONE (DELTASONE) 50 MG tablet Take 1 tablet (50 mg total) by mouth  daily with breakfast. (Patient not taking: Reported on 07/13/2017) 5 tablet 0 Not Taking at Unknown time  . Prenatal MV-Min-FA-Omega-3 (PRENATAL GUMMIES/DHA & FA) 0.4-32.5 MG CHEW Chew 1 each by mouth daily.    07/12/2017 at Unknown time  . promethazine (PHENERGAN) 25 MG tablet Take 1 tablet (25 mg total) by mouth every 6 (six) hours as needed for nausea or vomiting. 30 tablet 3 09/13/2017  . sertraline (ZOLOFT) 50 MG tablet Take 1 tablet (50 mg total) by mouth daily. 30 tablet 0 09/13/2017    Review of Systems  Constitutional: Negative.   HENT: Negative.   Eyes: Negative.   Respiratory: Negative.   Cardiovascular: Negative.   Gastrointestinal: Negative.   Endocrine: Negative.   Genitourinary: Positive for pelvic pain (pressure) and vaginal discharge (watery).   Musculoskeletal: Negative.   Skin: Negative.   Allergic/Immunologic: Negative.   Neurological: Negative.   Hematological: Negative.   Psychiatric/Behavioral: Negative.    Physical Exam   Blood pressure (!) 110/58, pulse 75, temperature 98 F (36.7 C), temperature source Oral, resp. rate 17, last menstrual period 05/10/2017.  Physical Exam  Nursing note and vitals reviewed. Constitutional: She is oriented to person, place, and time. She appears well-developed and well-nourished.  HENT:  Head: Normocephalic.  Eyes: Pupils are equal, round, and reactive to light.  Neck: Normal range of motion.  Cardiovascular: Normal rate, regular rhythm and normal heart sounds.  Respiratory: Effort normal and breath sounds normal.  GI: Soft. Bowel sounds are normal.  Genitourinary:  Genitourinary Comments: Uterus: gravid, S=D, cx: smooth, pink, no lesions, scant amt of mucoid, white vaginal d/c, closed/long/firm, no CMT or friability, no adnexal tenderness   Musculoskeletal: Normal range of motion.  Neurological: She is alert and oriented to person, place, and time.  Skin: Skin is warm and dry.  Psychiatric: She has a normal mood and affect. Her behavior is normal. Judgment and thought content normal.    MAU Course  Procedures  MDM CCUA Wet Prep GC/CT HIV Offered antiemetic -- pt declined "not that nauseated" Prior to d/c home patient asks if something can be done about her BMs, because she has diarrhea, but she "thinks she is impacted" -- recommended Immodium for diarrhea or Miralax for constipation *Consult with Dr. Billy Coast @ [phone] - notified of patient's complaints, assessments, lab results, tx plan d/c home - ok to d/c home, agrees with plan  Results for orders placed or performed during the hospital encounter of 09/15/17 (from the past 24 hour(s))  Urinalysis, Routine w reflex microscopic     Status: Abnormal   Collection Time: 09/15/17  2:49 AM  Result Value Ref Range   Color, Urine  YELLOW YELLOW   APPearance CLOUDY (A) CLEAR   Specific Gravity, Urine 1.012 1.005 - 1.030   pH 7.0 5.0 - 8.0   Glucose, UA NEGATIVE NEGATIVE mg/dL   Hgb urine dipstick NEGATIVE NEGATIVE   Bilirubin Urine NEGATIVE NEGATIVE   Ketones, ur NEGATIVE NEGATIVE mg/dL   Protein, ur NEGATIVE NEGATIVE mg/dL   Nitrite NEGATIVE NEGATIVE   Leukocytes, UA NEGATIVE NEGATIVE  Wet prep, genital     Status: Abnormal   Collection Time: 09/15/17  3:30 AM  Result Value Ref Range   Yeast Wet Prep HPF POC NONE SEEN NONE SEEN   Trich, Wet Prep NONE SEEN NONE SEEN   Clue Cells Wet Prep HPF POC NONE SEEN NONE SEEN   WBC, Wet Prep HPF POC FEW (A) NONE SEEN   Sperm NONE SEEN  Assessment and Plan  Pelvic pressure in pregnancy, antepartum, second trimester - Reassurance given that some pelvic pressure is normal in pregnancy. - Advised that if it persists or worsens through the night to call [facility] after 0800 for f/u plans - Keep scheduled appt with Dr. Billy Coastaavon on 12/11 Discharge home Patient verbalized an understanding of the plan of care and agrees.     Janet Moraolitta Naziah Weckerly, MSN, CNM 09/15/2017, 3:30 AM

## 2017-09-15 NOTE — MAU Note (Signed)
PT SAYS SHE HAS PELVIC PRESSURE  YESTERDAY- HAS CONTINUED - WORSE AT 7PM.   WATERY STOOLS  , NAUSEA,    PNC  WITH DR TAAVON     WATERY D/C - STARTED YESTERDAY -  STILL SAME.    LAST SEX-  1 WEEK AGO

## 2017-09-16 LAB — GC/CHLAMYDIA PROBE AMP (~~LOC~~) NOT AT ARMC
Chlamydia: NEGATIVE
Neisseria Gonorrhea: NEGATIVE

## 2017-10-07 ENCOUNTER — Inpatient Hospital Stay (HOSPITAL_COMMUNITY)
Admission: AD | Admit: 2017-10-07 | Discharge: 2017-10-08 | Disposition: A | Discharge status: Home / Self Care | Payer: 59 | Source: Ambulatory Visit | Attending: Obstetrics & Gynecology | Admitting: Obstetrics & Gynecology

## 2017-10-07 DIAGNOSIS — Z79899 Other long term (current) drug therapy: Secondary | ICD-10-CM | POA: Insufficient documentation

## 2017-10-07 DIAGNOSIS — J40 Bronchitis, not specified as acute or chronic: Secondary | ICD-10-CM | POA: Insufficient documentation

## 2017-10-07 DIAGNOSIS — O9989 Other specified diseases and conditions complicating pregnancy, childbirth and the puerperium: Secondary | ICD-10-CM | POA: Insufficient documentation

## 2017-10-07 DIAGNOSIS — O99612 Diseases of the digestive system complicating pregnancy, second trimester: Secondary | ICD-10-CM | POA: Diagnosis not present

## 2017-10-07 DIAGNOSIS — Z3A21 21 weeks gestation of pregnancy: Secondary | ICD-10-CM | POA: Insufficient documentation

## 2017-10-07 DIAGNOSIS — R05 Cough: Secondary | ICD-10-CM | POA: Insufficient documentation

## 2017-10-07 DIAGNOSIS — J45909 Unspecified asthma, uncomplicated: Secondary | ICD-10-CM | POA: Diagnosis not present

## 2017-10-07 DIAGNOSIS — F419 Anxiety disorder, unspecified: Secondary | ICD-10-CM | POA: Diagnosis not present

## 2017-10-07 DIAGNOSIS — O99012 Anemia complicating pregnancy, second trimester: Secondary | ICD-10-CM | POA: Insufficient documentation

## 2017-10-07 DIAGNOSIS — E282 Polycystic ovarian syndrome: Secondary | ICD-10-CM | POA: Diagnosis not present

## 2017-10-07 DIAGNOSIS — O3482 Maternal care for other abnormalities of pelvic organs, second trimester: Secondary | ICD-10-CM | POA: Insufficient documentation

## 2017-10-07 DIAGNOSIS — N83209 Unspecified ovarian cyst, unspecified side: Secondary | ICD-10-CM | POA: Insufficient documentation

## 2017-10-07 DIAGNOSIS — J209 Acute bronchitis, unspecified: Secondary | ICD-10-CM | POA: Diagnosis not present

## 2017-10-07 DIAGNOSIS — O99342 Other mental disorders complicating pregnancy, second trimester: Secondary | ICD-10-CM | POA: Insufficient documentation

## 2017-10-07 DIAGNOSIS — Z87891 Personal history of nicotine dependence: Secondary | ICD-10-CM | POA: Diagnosis not present

## 2017-10-07 DIAGNOSIS — R0602 Shortness of breath: Secondary | ICD-10-CM | POA: Diagnosis present

## 2017-10-07 DIAGNOSIS — R059 Cough, unspecified: Secondary | ICD-10-CM

## 2017-10-07 DIAGNOSIS — R109 Unspecified abdominal pain: Secondary | ICD-10-CM | POA: Insufficient documentation

## 2017-10-07 DIAGNOSIS — O99512 Diseases of the respiratory system complicating pregnancy, second trimester: Secondary | ICD-10-CM | POA: Diagnosis not present

## 2017-10-07 DIAGNOSIS — K219 Gastro-esophageal reflux disease without esophagitis: Secondary | ICD-10-CM | POA: Diagnosis not present

## 2017-10-07 DIAGNOSIS — F329 Major depressive disorder, single episode, unspecified: Secondary | ICD-10-CM | POA: Insufficient documentation

## 2017-10-08 ENCOUNTER — Inpatient Hospital Stay (HOSPITAL_COMMUNITY): Payer: 59

## 2017-10-08 ENCOUNTER — Encounter (HOSPITAL_COMMUNITY): Payer: Self-pay | Admitting: *Deleted

## 2017-10-08 DIAGNOSIS — J45909 Unspecified asthma, uncomplicated: Secondary | ICD-10-CM

## 2017-10-08 DIAGNOSIS — J209 Acute bronchitis, unspecified: Secondary | ICD-10-CM | POA: Diagnosis not present

## 2017-10-08 LAB — URINALYSIS, ROUTINE W REFLEX MICROSCOPIC
Bilirubin Urine: NEGATIVE
Glucose, UA: NEGATIVE mg/dL
Hgb urine dipstick: NEGATIVE
KETONES UR: 15 mg/dL — AB
LEUKOCYTES UA: NEGATIVE
NITRITE: NEGATIVE
PH: 6 (ref 5.0–8.0)
Protein, ur: NEGATIVE mg/dL
SPECIFIC GRAVITY, URINE: 1.02 (ref 1.005–1.030)

## 2017-10-08 LAB — INFLUENZA PANEL BY PCR (TYPE A & B)
INFLBPCR: NEGATIVE
Influenza A By PCR: NEGATIVE

## 2017-10-08 MED ORDER — ALBUTEROL SULFATE HFA 108 (90 BASE) MCG/ACT IN AERS: 2.0000 | INHALATION_SPRAY | RESPIRATORY_TRACT | 1 refills | Status: DC | PRN

## 2017-10-08 MED ORDER — PREDNISONE 10 MG (21) PO TBPK: ORAL_TABLET | ORAL | 0 refills | Status: DC

## 2017-10-08 MED ORDER — IPRATROPIUM-ALBUTEROL 0.5-2.5 (3) MG/3ML IN SOLN
3.0000 mL | Freq: Four times a day (QID) | RESPIRATORY_TRACT | Status: DC
  Administered 2017-10-08: 3 mL via RESPIRATORY_TRACT
  Filled 2017-10-08: qty 3

## 2017-10-08 MED ORDER — ONDANSETRON 8 MG PO TBDP
8.0000 mg | ORAL_TABLET | Freq: Once | ORAL | Status: AC
  Administered 2017-10-08: 8 mg via ORAL
  Filled 2017-10-08: qty 1

## 2017-10-08 NOTE — MAU Note (Addendum)
Pt presents to MAU c/o cough x2days and the pt states she had a cold on Sunday and since then she has had a cough. Pt states it can be hard to breathe at times. Pt states cramping but she thinks it is related to all the coughing . Pt states after a coughing spell she has a midsternal pain and back pain

## 2017-10-08 NOTE — MAU Provider Note (Signed)
Chief Complaint: Cough   First Provider Initiated Contact with Patient 10/08/17 0105      SUBJECTIVE HPI: Janet Rich is a 21 y.o. G1P0000 at 4082w0d by LMP who presents to maternity admissions reporting cough and shortness of breath. She reports her symptoms started as a cold with congestion and runny nose.  Yesterday she started coughing and has continued to cough frequently. Her cough is worsening and is now associated with shortness of breath and substernal chest pain while coughing. The pain subsides when she is not coughing. She reports some mild abdominal pain with coughing earlier today but denies any pain in MAU.  There are no other associated symptoms. She has asthma but has not used her inhaler, and is not sure she has one at home. She has not tried any other treatments. She denies vaginal bleeding, vaginal itching/burning, urinary symptoms, h/a, dizziness, n/v, or fever/chills.     HPI  Past Medical History:  Diagnosis Date  . Anemia    with periods  . Ankle fracture    right  . Anxiety   . Asthma   . Back pain   . Depression   . Endometriosis   . GERD (gastroesophageal reflux disease)   . Headache   . Ovarian cyst   . Ovarian cyst   . PCOS (polycystic ovarian syndrome)   . Pneumonia    age 89  . PONV (postoperative nausea and vomiting)    nausea after wisdom teeth extraction  . Vaginal Pap smear, abnormal    bx ok   Past Surgical History:  Procedure Laterality Date  . NO PAST SURGERIES    . ROBOTIC ASSISTED DIAGNOSTIC LAPAROSCOPY N/A 02/18/2017   Procedure: ROBOTIC ASSISTED DIAGNOSTIC LAPAROSCOPY,EXCISION AND ABLATION OF ENDOMETRIOSIS,LYSIS OF ADHESIONS;  Surgeon: Olivia Mackieichard Taavon, MD;  Location: [facility];  Service: Gynecology;  Laterality: N/A;  . WISDOM TOOTH EXTRACTION  2013   Social History   Socioeconomic History  . Marital status: Married    Spouse name: Not on file  . Number of children: Not on file  . Years of education: Not on file  . Highest education  level: Not on file  Social Needs  . Financial resource strain: Not on file  . Food insecurity - worry: Not on file  . Food insecurity - inability: Not on file  . Transportation needs - medical: Not on file  . Transportation needs - non-medical: Not on file  Occupational History  . Not on file  Tobacco Use  . Smoking status: Former Smoker    Types: Cigarettes  . Smokeless tobacco: Never Used  . Tobacco comment: age [age], quit  Substance and Sexual Activity  . Alcohol use: Yes    Comment: occassionally  . Drug use: No  . Sexual activity: Yes    Birth control/protection: None  Other Topics Concern  . Not on file  Social History Narrative  . Not on file   No current facility-administered medications on file prior to encounter.    Current Outpatient Medications on File Prior to Encounter  Medication Sig Dispense Refill  . acetaminophen (TYLENOL) 500 MG tablet Take 500 mg by mouth every 6 (six) hours as needed for moderate pain.    Marland Kitchen. guaifenesin (ROBITUSSIN) 100 MG/5ML syrup Take 200 mg by mouth 3 (three) times daily as needed for cough.    . Prenatal MV-Min-FA-Omega-3 (PRENATAL GUMMIES/DHA & FA) 0.4-32.5 MG CHEW Chew 1 each by mouth daily.     . promethazine (PHENERGAN) 25 MG tablet Take  1 tablet (25 mg total) by mouth every 6 (six) hours as needed for nausea or vomiting. 30 tablet 3  . ranitidine (ZANTAC) 150 MG tablet Take 150 mg by mouth 2 (two) times daily.    . sertraline (ZOLOFT) 50 MG tablet Take 1 tablet (50 mg total) by mouth daily. 30 tablet 0  . Docosahexaenoic Acid (DHA COMPLETE PO) Take 1 capsule by mouth at bedtime.    . Doxylamine-Pyridoxine ER (BONJESTA) 20-20 MG TBCR Take 20 mg by mouth at bedtime.    . metoCLOPramide (REGLAN) 10 MG tablet Take 10 mg by mouth every 8 (eight) hours as needed for nausea or vomiting.     Marland Kitchen. oxyCODONE-acetaminophen (ROXICET) 5-325 MG tablet Take 1-2 tablets by mouth every 4 (four) hours as needed for severe pain. (Patient not taking:  Reported on 07/13/2017) 10 tablet 0   Allergies  Allergen Reactions  . Other     Must have anti-emetic prior to being given any narcotics   . Latex Rash    ROS:  Review of Systems  Constitutional: Negative for chills, fatigue and fever.  HENT: Positive for congestion, rhinorrhea, sinus pressure, sore throat and voice change.   Respiratory: Positive for cough, chest tightness and shortness of breath.   Cardiovascular: Negative for chest pain.  Gastrointestinal: Negative for nausea.  Genitourinary: Negative for difficulty urinating, dysuria, flank pain, pelvic pain, vaginal bleeding, vaginal discharge and vaginal pain.  Neurological: Negative for dizziness and headaches.  Psychiatric/Behavioral: Negative.      I have reviewed patient's Past Medical Hx, Surgical Hx, Family Hx, Social Hx, medications and allergies.   Physical Exam   Patient Vitals for the past 24 hrs:  BP Temp Temp src Pulse Resp Height Weight  10/08/17 0354 111/61 98.3 F (36.8 C) Oral 89 19 - -  10/08/17 0008 120/75 99.1 F (37.3 C) Oral 89 (!) 21 5\' 1"  (1.549 m) 198 lb (89.8 kg)   Constitutional: Well-developed, well-nourished female in no acute distress.  Cardiovascular: normal rate Respiratory: normal effort GI: Abd soft, non-tender. Pos BS x 4 MS: Extremities nontender, no edema, normal ROM Neurologic: Alert and oriented x 4.  GU: Neg CVAT.  PELVIC EXAM: Deferred  FHT 141 by doppler  LAB RESULTS Results for orders placed or performed during the hospital encounter of 10/07/17 (from the past 24 hour(s))  Urinalysis, Routine w reflex microscopic     Status: Abnormal   Collection Time: 10/07/17 11:52 PM  Result Value Ref Range   Color, Urine YELLOW YELLOW   APPearance HAZY (A) CLEAR   Specific Gravity, Urine 1.020 1.005 - 1.030   pH 6.0 5.0 - 8.0   Glucose, UA NEGATIVE NEGATIVE mg/dL   Hgb urine dipstick NEGATIVE NEGATIVE   Bilirubin Urine NEGATIVE NEGATIVE   Ketones, ur 15 (A) NEGATIVE mg/dL    Protein, ur NEGATIVE NEGATIVE mg/dL   Nitrite NEGATIVE NEGATIVE   Leukocytes, UA NEGATIVE NEGATIVE  Influenza panel by PCR (type A & B)     Status: None   Collection Time: 10/08/17 12:56 AM  Result Value Ref Range   Influenza A By PCR NEGATIVE NEGATIVE   Influenza B By PCR NEGATIVE NEGATIVE    --/--/A POS (08/30 2036)  IMAGING Dg Chest 2 View  Result Date: 10/08/2017 CLINICAL DATA:  Cough EXAM: CHEST  2 VIEW COMPARISON:  Chest radiograph 04/24/2017 FINDINGS: The heart size and mediastinal contours are within normal limits. Both lungs are clear. The visualized skeletal structures are unremarkable. IMPRESSION: Normal chest. Electronically Signed  By: Deatra Robinson M.D.   On: 10/08/2017 03:31    MAU Management/MDM: Ordered labs and reviewed results.  Flu swab negative, CXR wnl today.  Likely asthmatic bronchitis. Pt with significant improvement in shortness of breath after nebulizer breathing treatment from respiratory but still coughing despite therapy.  Coughing causes vomiting so Zofran 8 mg ODT x 1 dose in MAU.  Consult Dr Juliene Pina with assessment and findings. Rx for albuterol inhaler with 1 refill and Prednisone taper.  Pt to f/u with PCP for further asthma management. Keep scheduled prenatal appointments. Return to MAU as needed for emergencies.  Pt discharged with strict precautions.  ASSESSMENT 1. Bronchitis with asthma, acute   2. Shortness of breath   3. Cough     PLAN Discharge home Allergies as of 10/08/2017      Reactions   Other    Must have anti-emetic prior to being given any narcotics    Latex Rash      Medication List    STOP taking these medications   predniSONE 50 MG tablet Commonly known as:  DELTASONE Replaced by:  predniSONE 10 MG (21) Tbpk tablet     TAKE these medications   acetaminophen 500 MG tablet Commonly known as:  TYLENOL Take 500 mg by mouth every 6 (six) hours as needed for moderate pain.   albuterol 108 (90 Base) MCG/ACT  inhaler Commonly known as:  PROVENTIL HFA;VENTOLIN HFA Inhale 2 puffs into the lungs every 4 (four) hours as needed for wheezing or shortness of breath.   BONJESTA 20-20 MG Tbcr Generic drug:  Doxylamine-Pyridoxine ER Take 20 mg by mouth at bedtime.   DHA COMPLETE PO Take 1 capsule by mouth at bedtime.   guaifenesin 100 MG/5ML syrup Commonly known as:  ROBITUSSIN Take 200 mg by mouth 3 (three) times daily as needed for cough.   metoCLOPramide 10 MG tablet Commonly known as:  REGLAN Take 10 mg by mouth every 8 (eight) hours as needed for nausea or vomiting.   oxyCODONE-acetaminophen 5-325 MG tablet Commonly known as:  ROXICET Take 1-2 tablets by mouth every 4 (four) hours as needed for severe pain.   predniSONE 10 MG (21) Tbpk tablet Commonly known as:  STERAPRED UNI-PAK 21 TAB Take 6 tablets on Day 1, 5 tablets on Day 2, 4 tablets on Day 3, 3 tablets on Day 4, 2 tablets on Day 5, and 1 tablet on Day 6 Replaces:  predniSONE 50 MG tablet   PRENATAL GUMMIES/DHA & FA 0.4-32.5 MG Chew Chew 1 each by mouth daily.   promethazine 25 MG tablet Commonly known as:  PHENERGAN Take 1 tablet (25 mg total) by mouth every 6 (six) hours as needed for nausea or vomiting.   ranitidine 150 MG tablet Commonly known as:  ZANTAC Take 150 mg by mouth 2 (two) times daily.   sertraline 50 MG tablet Commonly known as:  ZOLOFT Take 1 tablet (50 mg total) by mouth daily.      Follow-up Information    [facility], [facility] Follow up.   Why:  As scheduled, return to MAU as needed for emergencies Contact information: [address] [place] [address] [phone]           Sharen Counter Certified Nurse-Midwife 10/08/2017  6:47 AM

## 2017-10-19 NOTE — L&D Delivery Note (Signed)
Operative Delivery Note At 7:12 PM a viable female was delivered via vacuum assisted vaginal delivery. In to room for prolonged decel, patient with amnioinfusion in place for variable decels that had improved with amnioinfusion. Found to be 9.5/100/0. FHR improved with repositioning and head descended to +1 and with push, anterior lip was easily reducible. Patient started pushing and pushed very effectively. I counseled her regarding the risks of c-section if had another decel and she was agreeable. At this point, some bright red bleeding noted from vagina. I also counseled her regarding vacuum assisted delivery including risks of anesthesia, bleeding, infection, damage to maternal tissues, fetal cephalhematoma.  There is also the risk of inability to effect vaginal delivery of the head, or shoulder dystocia that cannot be resolved by established maneuvers, leading to the need for emergency cesarean section. She verbalized understanding and gave consent for vacuum delivery.   With caput at +3 and head at +2, infant had another decel and vacuum applied 2 cm anterior to the posterior fontanelle, equidistant across the sagittal suture and all vaginal tissue cleared from cervix.  With the contraction, pressure increased on vacuum and gentle downward traction done in conjunction with maternal expulsive efforts effected descent of head to +3. There were a total of 3 pulls and 2 pop-offs with pressure released in between pulls. After second pop-off FHR was audible in the 100-120s with occasional dips to 70s. Patient continued pushing and easily delivered infant from ROA position through a loose nuchal cord wrapped 1.5 times around neck and body. Dark blood delivered at same time with appearance of placental abruption. Infant with poor tone initially, cord clamped and cut and infant handed to waiting pedi staff but quickly became vigorous. Per notes, no cephalohematoma noted at this time. Placenta delivered spontaneously  with dark clots, consistent with abruption.   After delivery of placenta, 2nd degree perineal laceration repaired with 2-0 Vicryl. Check of cervix demonstrated placental membranes and tissues within and a sweep of uterus demonstrated placental fragments. Epidural re-bolused and patient given fentanyl for pain relief. A vigorous bimanual exam done with lap sponge and uterus cleared of all placental tissue. Patient tolerated relatively well. This exam tore the repaired perineum and the perineum was again repaired with 2-0 vicryl. A right vaginal wall laceration was noted to be bleeding and was repaired with a figure of 8 with 2-0 Vicryl. Fundus firm at this point.   Given bimanual, will keep patient on 24 hrs Ancef post partum and monitor closely for s/s infection. Placental findings consistent with abruption, cord pH as below and NICU/pedi staff notified who will monitor infant closely.  APGAR: 8, 9; weight 4 lb 15 oz (2240 g).   Placenta status: path Cord:  with the following complications: .  Cord pH: 6.96  Anesthesia:   Instruments: Bell cup vacuum Episiotomy: None Lacerations: 2nd degree;Perineal;Vaginal Suture Repair: 2.0 vicryl Est. Blood Loss (mL): 400  Mom to postpartum.  Baby to Nursery.  Conan Bowens 02/12/2018, 9:09 PM

## 2017-11-08 LAB — GLUCOSE TOLERANCE, 1 HOUR: GTT, 1 hr: 132

## 2017-11-24 ENCOUNTER — Encounter (HOSPITAL_COMMUNITY): Payer: Self-pay | Admitting: *Deleted

## 2017-11-24 ENCOUNTER — Other Ambulatory Visit: Payer: Self-pay

## 2017-11-24 ENCOUNTER — Inpatient Hospital Stay (HOSPITAL_COMMUNITY)
Admission: AD | Admit: 2017-11-24 | Discharge: 2017-11-24 | Disposition: A | Discharge status: Home / Self Care | Payer: 59 | Source: Ambulatory Visit | Attending: Obstetrics and Gynecology | Admitting: Obstetrics and Gynecology

## 2017-11-24 DIAGNOSIS — Z87891 Personal history of nicotine dependence: Secondary | ICD-10-CM | POA: Diagnosis not present

## 2017-11-24 DIAGNOSIS — M549 Dorsalgia, unspecified: Secondary | ICD-10-CM | POA: Diagnosis present

## 2017-11-24 DIAGNOSIS — O4702 False labor before 37 completed weeks of gestation, second trimester: Secondary | ICD-10-CM | POA: Diagnosis not present

## 2017-11-24 DIAGNOSIS — R109 Unspecified abdominal pain: Secondary | ICD-10-CM | POA: Diagnosis present

## 2017-11-24 DIAGNOSIS — Z3A27 27 weeks gestation of pregnancy: Secondary | ICD-10-CM | POA: Diagnosis not present

## 2017-11-24 DIAGNOSIS — O26892 Other specified pregnancy related conditions, second trimester: Secondary | ICD-10-CM | POA: Diagnosis present

## 2017-11-24 LAB — URINALYSIS, ROUTINE W REFLEX MICROSCOPIC
BILIRUBIN URINE: NEGATIVE
GLUCOSE, UA: NEGATIVE mg/dL
Hgb urine dipstick: NEGATIVE
Ketones, ur: NEGATIVE mg/dL
Leukocytes, UA: NEGATIVE
NITRITE: NEGATIVE
Protein, ur: NEGATIVE mg/dL
SPECIFIC GRAVITY, URINE: 1.01 (ref 1.005–1.030)
pH: 7 (ref 5.0–8.0)

## 2017-11-24 LAB — FETAL FIBRONECTIN: Fetal Fibronectin: NEGATIVE

## 2017-11-24 MED ORDER — NIFEDIPINE 10 MG PO CAPS
10.0000 mg | ORAL_CAPSULE | ORAL | Status: AC | PRN
  Administered 2017-11-24 (×4): 10 mg via ORAL
  Filled 2017-11-24 (×4): qty 1

## 2017-11-24 MED ORDER — BETAMETHASONE SOD PHOS & ACET 6 (3-3) MG/ML IJ SUSP
12.0000 mg | Freq: Once | INTRAMUSCULAR | Status: AC
  Administered 2017-11-24: 12 mg via INTRAMUSCULAR
  Filled 2017-11-24: qty 2

## 2017-11-24 MED ORDER — LACTATED RINGERS IV SOLN
INTRAVENOUS | Status: DC
  Administered 2017-11-24: 1000 mL via INTRAVENOUS

## 2017-11-24 NOTE — MAU Provider Note (Signed)
Chief Complaint:  Abdominal Pain; Back Pain; and Dysuria   First Provider Initiated Contact with Patient 11/24/17 2012     HPI: Janet Rich is a 22 y.o. G1P0000 at 3327w5dwho presents to maternity admissions reporting preterm uterine contractions.  Has been having them for a week. Did not go in because she lost her insurance and will be leaving [facility] and coming to [facility].  . She reports good fetal movement, denies LOF, vaginal bleeding, vaginal itching/burning, urinary symptoms, h/a, dizziness, n/v, diarrhea, constipation or fever/chills.  She denies headache, visual changes or RUQ abdominal pain.  Abdominal Pain  This is a new problem. The current episode started in the past 7 days. The onset quality is gradual. The problem occurs intermittently. The problem has been unchanged. The pain is located in the suprapubic region, LLQ and RLQ. The quality of the pain is cramping and burning. The abdominal pain radiates to the back. Associated symptoms include dysuria and frequency. Pertinent negatives include no constipation, diarrhea, fever, headaches, hematuria, myalgias, nausea or vomiting. Nothing aggravates the pain. The pain is relieved by nothing. She has tried nothing for the symptoms.  Back Pain  This is a new problem. The current episode started in the past 7 days. The problem occurs intermittently. The pain is present in the lumbar spine. Associated symptoms include abdominal pain and dysuria. Pertinent negatives include no fever or headaches.  Dysuria   This is a new problem. The current episode started in the past 7 days. The problem has been unchanged. The quality of the pain is described as burning. There has been no fever. There is no history of pyelonephritis. Associated symptoms include frequency. Pertinent negatives include no hematuria, nausea or vomiting. She has tried nothing for the symptoms.    RN Note: Been about a wk of this.  Under a lot of stress. Is cramping a lot, today it  got really bad. Having a lot of pain and pressure in RLQ  Having frequency and pain with urination.  Back pain.   Past Medical History: Past Medical History:  Diagnosis Date  . Anemia    with periods  . Ankle fracture    right  . Anxiety   . Asthma   . Back pain   . Depression   . Endometriosis   . GERD (gastroesophageal reflux disease)   . Headache   . Ovarian cyst   . Ovarian cyst   . PCOS (polycystic ovarian syndrome)   . Pneumonia    age 89  . PONV (postoperative nausea and vomiting)    nausea after wisdom teeth extraction  . Vaginal Pap smear, abnormal    bx ok    Past obstetric history: OB History  Gravida Para Term Preterm AB Living  1 0 0 0 0 0  SAB TAB Ectopic Multiple Live Births  0 0 0 0      # Outcome Date GA Lbr Len/2nd Weight Sex Delivery Anes PTL Lv  1 Current               Past Surgical History: Past Surgical History:  Procedure Laterality Date  . NO PAST SURGERIES    . ROBOTIC ASSISTED DIAGNOSTIC LAPAROSCOPY N/A 02/18/2017   Procedure: ROBOTIC ASSISTED DIAGNOSTIC LAPAROSCOPY,EXCISION AND ABLATION OF ENDOMETRIOSIS,LYSIS OF ADHESIONS;  Surgeon: Olivia Mackieichard Taavon, MD;  Location: [facility];  Service: Gynecology;  Laterality: N/A;  . WISDOM TOOTH EXTRACTION  2013    Family History: Family History  Problem Relation Age of Onset  . Endometriosis  Mother   . Diabetes Mother   . Breast cancer Paternal Grandmother   . Cancer Paternal Grandmother   . Schizophrenia Father   . Heart disease Neg Hx   . Colon cancer Neg Hx   . Ovarian cancer Neg Hx     Social History: Social History   Tobacco Use  . Smoking status: Former Smoker    Types: Cigarettes  . Smokeless tobacco: Never Used  . Tobacco comment: age [age], quit  Substance Use Topics  . Alcohol use: Yes    Comment: occassionally  . Drug use: No    Allergies:  Allergies  Allergen Reactions  . Other     Must have anti-emetic prior to being given any narcotics   . Latex Rash    Meds:   Medications Prior to Admission  Medication Sig Dispense Refill Last Dose  . acetaminophen (TYLENOL) 500 MG tablet Take 500 mg by mouth every 6 (six) hours as needed for moderate pain.   10/07/2017 at Unknown time  . albuterol (PROVENTIL HFA;VENTOLIN HFA) 108 (90 Base) MCG/ACT inhaler Inhale 2 puffs into the lungs every 4 (four) hours as needed for wheezing or shortness of breath. 1 Inhaler 1   . Docosahexaenoic Acid (DHA COMPLETE PO) Take 1 capsule by mouth at bedtime.   Unknown at Unknown time  . Doxylamine-Pyridoxine ER (BONJESTA) 20-20 MG TBCR Take 20 mg by mouth at bedtime.   Unknown at Unknown time  . guaifenesin (ROBITUSSIN) 100 MG/5ML syrup Take 200 mg by mouth 3 (three) times daily as needed for cough.   10/07/2017 at Unknown time  . metoCLOPramide (REGLAN) 10 MG tablet Take 10 mg by mouth every 8 (eight) hours as needed for nausea or vomiting.    Unknown at Unknown time  . oxyCODONE-acetaminophen (ROXICET) 5-325 MG tablet Take 1-2 tablets by mouth every 4 (four) hours as needed for severe pain. (Patient not taking: Reported on 07/13/2017) 10 tablet 0 Not Taking at Unknown time  . predniSONE (STERAPRED UNI-PAK 21 TAB) 10 MG (21) TBPK tablet Take 6 tablets on Day 1, 5 tablets on Day 2, 4 tablets on Day 3, 3 tablets on Day 4, 2 tablets on Day 5, and 1 tablet on Day 6 21 tablet 0   . Prenatal MV-Min-FA-Omega-3 (PRENATAL GUMMIES/DHA & FA) 0.4-32.5 MG CHEW Chew 1 each by mouth daily.    Past Week at Unknown time  . promethazine (PHENERGAN) 25 MG tablet Take 1 tablet (25 mg total) by mouth every 6 (six) hours as needed for nausea or vomiting. 30 tablet 3 Past Month at Unknown time  . ranitidine (ZANTAC) 150 MG tablet Take 150 mg by mouth 2 (two) times daily.   10/07/2017 at Unknown time  . sertraline (ZOLOFT) 50 MG tablet Take 1 tablet (50 mg total) by mouth daily. 30 tablet 0 Past Month at Unknown time    I have reviewed patient's Past Medical Hx, Surgical Hx, Family Hx, Social Hx, medications  and allergies.   ROS:  Review of Systems  Constitutional: Negative for fever.  Gastrointestinal: Positive for abdominal pain. Negative for constipation, diarrhea, nausea and vomiting.  Genitourinary: Positive for dysuria and frequency. Negative for hematuria.  Musculoskeletal: Positive for back pain. Negative for myalgias.  Neurological: Negative for headaches.   Other systems negative  Physical Exam   Patient Vitals for the past 24 hrs:  BP Temp Temp src Pulse Resp SpO2 Weight  11/24/17 1852 124/71 98.7 F (37.1 C) Oral 90 18 98 % 204 lb (  92.5 kg)   Constitutional: Well-developed, well-nourished female in no acute distress.  Cardiovascular: normal rate and rhythm Respiratory: normal effort, clear to auscultation bilaterally GI: Abd soft, non-tender, gravid appropriate for gestational age.   No rebound or guarding. MS: Extremities nontender, no edema, normal ROM Neurologic: Alert and oriented x 4.  GU: Neg CVAT.  PELVIC EXAM: Dilation: Fingertip Effacement (%): 80 Cervical Position: Middle Station: -2 Presentation: Vertex Exam by:: Mayford Knife CNM  FHT:  Baseline 140 , moderate variability, accelerations present, no decelerations Contractions: Uterine irritability, irregular   Labs: --/--/A POS (08/30 2036) Results for orders placed or performed during the hospital encounter of 11/24/17 (from the past 24 hour(s))  Urinalysis, Routine w reflex microscopic     Status: None   Collection Time: 11/24/17  6:55 PM  Result Value Ref Range   Color, Urine YELLOW YELLOW   APPearance CLEAR CLEAR   Specific Gravity, Urine 1.010 1.005 - 1.030   pH 7.0 5.0 - 8.0   Glucose, UA NEGATIVE NEGATIVE mg/dL   Hgb urine dipstick NEGATIVE NEGATIVE   Bilirubin Urine NEGATIVE NEGATIVE   Ketones, ur NEGATIVE NEGATIVE mg/dL   Protein, ur NEGATIVE NEGATIVE mg/dL   Nitrite NEGATIVE NEGATIVE   Leukocytes, UA NEGATIVE NEGATIVE  Fetal fibronectin     Status: None   Collection Time: 11/24/17  8:20  PM  Result Value Ref Range   Fetal Fibronectin NEGATIVE NEGATIVE    Imaging:  No results found.  MAU Course/MDM: I have ordered labs and reviewed results.  NST reviewed and found reactive Consult Dr Emelda Fear with presentation, exam findings and test results.  Treatments in MAU included Procardia with good results Recheck of cervix is unchanged Betamethasone given at 2055.    Assessment: [facility] at [redacted]w[redacted]d Preterm uterine contractions with some cervical change Negative fetal fibronectin  Plan: Discharge home Betamethasone second dose tomorrow Preterm Labor precautions and fetal kick counts Follow up in Office for prenatal visits and recheck  Encouraged to return here or to other Urgent Care/ED if she develops worsening of symptoms, increase in pain, fever, or other concerning symptoms.   Pt stable at time of discharge.  Wynelle Bourgeois CNM, MSN Certified Nurse-Midwife 11/24/2017 8:12 PM

## 2017-11-24 NOTE — Progress Notes (Addendum)
g1 @ 27.[redacted] [facility]. Here for cramping and back pain.   SVE ft/50 per previous nurse. Betamethasone and 1st and 2nd dose procardia admin by previous nurse.   2132: pt is in the bathroom.   2157: 3rd dose of procardia given. VSS. See flow sheet.   Pt states tightening 1 or 2 in an hr.

## 2017-11-24 NOTE — Discharge Instructions (Signed)
Pelvic Rest °Pelvic rest may be recommended if: °· Your placenta is partially or completely covering the opening of your cervix (placenta previa). °· There is bleeding between the wall of the uterus and the amniotic sac in the first trimester of pregnancy (subchorionic hemorrhage). °· You went into labor too early (preterm labor). ° °Based on your overall health and the health of your baby, your health care provider will decide if pelvic rest is right for you. °How do I rest my pelvis? °For as long as told by your health care provider: °· Do not have sex, sexual stimulation, or an orgasm. °· Do not use tampons. Do not douche. Do not put anything in your vagina. °· Do not lift anything that is heavier than 10 lb (4.5 kg). °· Avoid activities that take a lot of effort (are strenuous). °· Avoid any activity in which your pelvic muscles could become strained. ° °When should I seek medical care? °Seek medical care if you have: °· Cramping pain in your lower abdomen. °· Vaginal discharge. °· A low, dull backache. °· Regular contractions. °· Uterine tightening. ° °When should I seek immediate medical care? °Seek immediate medical care if: °· You have vaginal bleeding and you are pregnant. ° °This information is not intended to replace advice given to you by your health care provider. Make sure you discuss any questions you have with your health care provider. °Document Released: 01/30/2011 Document Revised: 03/12/2016 Document Reviewed: 04/08/2015 °Elsevier Interactive Patient Education © 2018 Elsevier Inc. ° ° °Preterm Labor and Birth Information °Pregnancy normally lasts 39-41 weeks. Preterm labor is when labor starts early. It starts before you have been pregnant for 37 whole weeks. °What are the risk factors for preterm labor? °Preterm labor is more likely to occur in women who: °· Have an infection while pregnant. °· Have a cervix that is short. °· Have gone into preterm labor before. °· Have had surgery on their  cervix. °· Are younger than age 17. °· Are older than age 35. °· Are African American. °· Are pregnant with two or more babies. °· Take street drugs while pregnant. °· Smoke while pregnant. °· Do not gain enough weight while pregnant. °· Got pregnant right after another pregnancy. ° °What are the symptoms of preterm labor? °Symptoms of preterm labor include: °· Cramps. The cramps may feel like the cramps some women get during their period. The cramps may happen with watery poop (diarrhea). °· Pain in the belly (abdomen). °· Pain in the lower back. °· Regular contractions or tightening. It may feel like your belly is getting tighter. °· Pressure in the lower belly that seems to get stronger. °· More fluid (discharge) leaking from the vagina. The fluid may be watery or bloody. °· Water breaking. ° °Why is it important to notice signs of preterm labor? °Babies who are born early may not be fully developed. They have a higher chance for: °· Long-term heart problems. °· Long-term lung problems. °· Trouble controlling body systems, like breathing. °· Bleeding in the brain. °· A condition called cerebral palsy. °· Learning difficulties. °· Death. ° °These risks are highest for babies who are born before 34 weeks of pregnancy. °How is preterm labor treated? °Treatment depends on: °· How long you were pregnant. °· Your condition. °· The health of your baby. ° °Treatment may involve: °· Having a stitch (suture) placed in your cervix. When you give birth, your cervix opens so the baby can come out. The stitch keeps   the cervix from opening too soon. °· Staying at the hospital. °· Taking or getting medicines, such as: °? Hormone medicines. °? Medicines to stop contractions. °? Medicines to help the baby’s lungs develop. °? Medicines to prevent your baby from having cerebral palsy. ° °What should I do if I am in preterm labor? °If you think you are going into labor too soon, call your doctor right away. °How can I prevent preterm  labor? °· Do not use any tobacco products. °? Examples of these are cigarettes, chewing tobacco, and e-cigarettes. °? If you need help quitting, ask your doctor. °· Do not use street drugs. °· Do not use any medicines unless you ask your doctor if they are safe for you. °· Talk with your doctor before taking any herbal supplements. °· Make sure you gain enough weight. °· Watch for infection. If you think you might have an infection, get it checked right away. °· If you have gone into preterm labor before, tell your doctor. °This information is not intended to replace advice given to you by your health care provider. Make sure you discuss any questions you have with your health care provider. °Document Released: 01/01/2009 Document Revised: 03/17/2016 Document Reviewed: 02/26/2016 °Elsevier Interactive Patient Education © 2018 Elsevier Inc. ° °

## 2017-11-24 NOTE — MAU Note (Signed)
Been about a wk of this.  Under a lot of stress. Is cramping a lot, today it got really bad. Having a lot of pain and pressure in RLQ  Having frequency and pain with urination.  Back pain.

## 2017-11-25 ENCOUNTER — Inpatient Hospital Stay (HOSPITAL_COMMUNITY)
Admission: AD | Admit: 2017-11-25 | Discharge: 2017-11-25 | Disposition: A | Discharge status: Home / Self Care | Payer: 59 | Source: Ambulatory Visit | Attending: Obstetrics and Gynecology | Admitting: Obstetrics and Gynecology

## 2017-11-25 DIAGNOSIS — Z3A27 27 weeks gestation of pregnancy: Secondary | ICD-10-CM | POA: Diagnosis not present

## 2017-11-25 MED ORDER — BETAMETHASONE SOD PHOS & ACET 6 (3-3) MG/ML IJ SUSP
12.0000 mg | Freq: Once | INTRAMUSCULAR | Status: AC
  Administered 2017-11-25: 12 mg via INTRAMUSCULAR
  Filled 2017-11-25: qty 2

## 2017-11-28 ENCOUNTER — Encounter (HOSPITAL_COMMUNITY): Payer: Self-pay

## 2017-11-28 ENCOUNTER — Inpatient Hospital Stay (HOSPITAL_COMMUNITY)
Admission: AD | Admit: 2017-11-28 | Discharge: 2017-11-28 | Disposition: A | Discharge status: Home / Self Care | Payer: 59 | Source: Ambulatory Visit | Attending: Obstetrics and Gynecology | Admitting: Obstetrics and Gynecology

## 2017-11-28 DIAGNOSIS — O26892 Other specified pregnancy related conditions, second trimester: Secondary | ICD-10-CM | POA: Diagnosis not present

## 2017-11-28 DIAGNOSIS — Z3A28 28 weeks gestation of pregnancy: Secondary | ICD-10-CM | POA: Insufficient documentation

## 2017-11-28 DIAGNOSIS — Z87891 Personal history of nicotine dependence: Secondary | ICD-10-CM | POA: Insufficient documentation

## 2017-11-28 DIAGNOSIS — F419 Anxiety disorder, unspecified: Secondary | ICD-10-CM | POA: Insufficient documentation

## 2017-11-28 DIAGNOSIS — J45909 Unspecified asthma, uncomplicated: Secondary | ICD-10-CM | POA: Diagnosis not present

## 2017-11-28 DIAGNOSIS — K219 Gastro-esophageal reflux disease without esophagitis: Secondary | ICD-10-CM | POA: Insufficient documentation

## 2017-11-28 DIAGNOSIS — O99343 Other mental disorders complicating pregnancy, third trimester: Secondary | ICD-10-CM | POA: Diagnosis not present

## 2017-11-28 DIAGNOSIS — O99513 Diseases of the respiratory system complicating pregnancy, third trimester: Secondary | ICD-10-CM | POA: Diagnosis not present

## 2017-11-28 DIAGNOSIS — O99613 Diseases of the digestive system complicating pregnancy, third trimester: Secondary | ICD-10-CM | POA: Diagnosis not present

## 2017-11-28 DIAGNOSIS — R102 Pelvic and perineal pain: Secondary | ICD-10-CM | POA: Diagnosis not present

## 2017-11-28 DIAGNOSIS — F329 Major depressive disorder, single episode, unspecified: Secondary | ICD-10-CM | POA: Diagnosis not present

## 2017-11-28 DIAGNOSIS — Z79899 Other long term (current) drug therapy: Secondary | ICD-10-CM | POA: Insufficient documentation

## 2017-11-28 DIAGNOSIS — O4703 False labor before 37 completed weeks of gestation, third trimester: Secondary | ICD-10-CM | POA: Diagnosis not present

## 2017-11-28 DIAGNOSIS — O210 Mild hyperemesis gravidarum: Secondary | ICD-10-CM | POA: Insufficient documentation

## 2017-11-28 DIAGNOSIS — N949 Unspecified condition associated with female genital organs and menstrual cycle: Secondary | ICD-10-CM

## 2017-11-28 DIAGNOSIS — O479 False labor, unspecified: Secondary | ICD-10-CM

## 2017-11-28 DIAGNOSIS — O26893 Other specified pregnancy related conditions, third trimester: Secondary | ICD-10-CM

## 2017-11-28 DIAGNOSIS — R109 Unspecified abdominal pain: Secondary | ICD-10-CM | POA: Diagnosis present

## 2017-11-28 LAB — URINALYSIS, ROUTINE W REFLEX MICROSCOPIC
BILIRUBIN URINE: NEGATIVE
GLUCOSE, UA: NEGATIVE mg/dL
HGB URINE DIPSTICK: NEGATIVE
Ketones, ur: NEGATIVE mg/dL
Leukocytes, UA: NEGATIVE
Nitrite: NEGATIVE
PROTEIN: NEGATIVE mg/dL
SPECIFIC GRAVITY, URINE: 1.016 (ref 1.005–1.030)
pH: 8 (ref 5.0–8.0)

## 2017-11-28 LAB — WET PREP, GENITAL
Clue Cells Wet Prep HPF POC: NONE SEEN
SPERM: NONE SEEN
Trich, Wet Prep: NONE SEEN
YEAST WET PREP: NONE SEEN

## 2017-11-28 MED ORDER — NIFEDIPINE 10 MG PO CAPS: 10.0000 mg | ORAL_CAPSULE | ORAL | 0 refills | Status: DC | PRN

## 2017-11-28 NOTE — MAU Provider Note (Signed)
Chief Complaint:  Abdominal Cramping   First Provider Initiated Contact with Patient 11/28/17 1147      HPI: Janet Rich is a 22 y.o. G1P0000 at 53w2dwho presents to maternity admissions reporting increased back and abdominal pain in the last 24 hours. She has had multiple MAU visits for abdominal and back pain, most recently on 11/24/17. At that visit, some cervical change was noted with thinning and dilation to FT, procardia was given, but FFN was negative.  Pt reports the pain is low in her abdomen cramping pain, with some tightening in her upper abdomen as well, and the pain radiates into her low back. The pain is associated with an increase in white thin discharge, without vaginal itching/burning.  There are no other associated symptoms. She has been staying at home in bed without much activity because of all of this pain.  She has tried a pregnancy support belt but it does not help.  She was seen at [facility] but due to insurance changes, is planning to switch to [facility].   HPI  Past Medical History: Past Medical History:  Diagnosis Date  . Anemia    with periods  . Ankle fracture    right  . Anxiety   . Asthma   . Back pain   . Depression   . Endometriosis   . GERD (gastroesophageal reflux disease)   . Headache   . Ovarian cyst   . Ovarian cyst   . PCOS (polycystic ovarian syndrome)   . Pneumonia    age 23  . PONV (postoperative nausea and vomiting)    nausea after wisdom teeth extraction  . Vaginal Pap smear, abnormal    bx ok    Past obstetric history: OB History  Gravida Para Term Preterm AB Living  1 0 0 0 0 0  SAB TAB Ectopic Multiple Live Births  0 0 0 0      # Outcome Date GA Lbr Len/2nd Weight Sex Delivery Anes PTL Lv  1 Current               Past Surgical History: Past Surgical History:  Procedure Laterality Date  . NO PAST SURGERIES    . ROBOTIC ASSISTED DIAGNOSTIC LAPAROSCOPY N/A 02/18/2017   Procedure: ROBOTIC ASSISTED DIAGNOSTIC LAPAROSCOPY,EXCISION  AND ABLATION OF ENDOMETRIOSIS,LYSIS OF ADHESIONS;  Surgeon: Olivia Mackie, MD;  Location: [facility];  Service: Gynecology;  Laterality: N/A;  . WISDOM TOOTH EXTRACTION  2013    Family History: Family History  Problem Relation Age of Onset  . Endometriosis Mother   . Diabetes Mother   . Breast cancer Paternal Grandmother   . Cancer Paternal Grandmother   . Schizophrenia Father   . Heart disease Neg Hx   . Colon cancer Neg Hx   . Ovarian cancer Neg Hx     Social History: Social History   Tobacco Use  . Smoking status: Former Smoker    Types: Cigarettes  . Smokeless tobacco: Never Used  . Tobacco comment: age [age], quit  Substance Use Topics  . Alcohol use: Yes    Comment: occassionally  . Drug use: No    Allergies:  Allergies  Allergen Reactions  . Other     Must have anti-emetic prior to being given any narcotics   . Latex Rash    Meds:  No medications prior to admission.    ROS:  Review of Systems  Constitutional: Negative for chills, fatigue and fever.  Eyes: Negative for visual disturbance.  Respiratory: Negative  for shortness of breath.   Cardiovascular: Negative for chest pain.  Gastrointestinal: Positive for abdominal pain. Negative for nausea and vomiting.  Genitourinary: Positive for pelvic pain and vaginal discharge. Negative for difficulty urinating, dysuria, flank pain, vaginal bleeding and vaginal pain.  Musculoskeletal: Positive for back pain.  Neurological: Negative for dizziness and headaches.  Psychiatric/Behavioral: Negative.      I have reviewed patient's Past Medical Hx, Surgical Hx, Family Hx, Social Hx, medications and allergies.   Physical Exam   Patient Vitals for the past 24 hrs:  Temp Resp  11/28/17 1029 98.8 F (37.1 C) 17   Constitutional: Well-developed, well-nourished female in no acute distress.  Cardiovascular: normal rate Respiratory: normal effort GI: Abd soft, non-tender, gravid appropriate for gestational age.  MS:  Extremities nontender, no edema, normal ROM Neurologic: Alert and oriented x 4.  GU: Neg CVAT.    Dilation: Fingertip Effacement (%): Thick Cervical Position: Posterior Exam by:: Leftwich-Kirby, CNM  FHT:  Baseline 150 , moderate variability, accelerations present, no decelerations Contractions: None on toco or to palpation   Labs: Results for orders placed or performed during the hospital encounter of 11/28/17 (from the past 24 hour(s))  Urinalysis, Routine w reflex microscopic     Status: Abnormal   Collection Time: 11/28/17 10:17 AM  Result Value Ref Range   Color, Urine YELLOW YELLOW   APPearance HAZY (A) CLEAR   Specific Gravity, Urine 1.016 1.005 - 1.030   pH 8.0 5.0 - 8.0   Glucose, UA NEGATIVE NEGATIVE mg/dL   Hgb urine dipstick NEGATIVE NEGATIVE   Bilirubin Urine NEGATIVE NEGATIVE   Ketones, ur NEGATIVE NEGATIVE mg/dL   Protein, ur NEGATIVE NEGATIVE mg/dL   Nitrite NEGATIVE NEGATIVE   Leukocytes, UA NEGATIVE NEGATIVE  Wet prep, genital     Status: Abnormal   Collection Time: 11/28/17 12:00 PM  Result Value Ref Range   Yeast Wet Prep HPF POC NONE SEEN NONE SEEN   Trich, Wet Prep NONE SEEN NONE SEEN   Clue Cells Wet Prep HPF POC NONE SEEN NONE SEEN   WBC, Wet Prep HPF POC FEW (A) NONE SEEN   Sperm NONE SEEN    --/--/A POS (08/30 2036)  Imaging:  No results found.  MAU Course/MDM: I have ordered labs and reviewed results.  NST reviewed and reactive No evidence of preterm labor today Reassurance provided to pt who is anxious about the pain. Reviewed FHR tracing with pt and her recent negative FFN with unchanged cervix.  Pt reports feeling better about things just anxious to start care with a new provider. [facility] but does not have Medicaid card so no appointment yet Consult Dr Billy Coastaavon with presentation, exam findings and test results.  D/C home with preterm labor precautions [place], [facility] [place] [facility] so message  sent to make appointment there  Today's evaluation included a work-up for preterm labor which can be life-threatening for both mom and baby.  Assessment: 1. Pelvic pressure in pregnancy, antepartum, third trimester   2. Pelvic pressure in pregnancy, antepartum, second trimester   3. Morning sickness   4. Braxton Hicks contractions     Plan: Discharge home Labor precautions and fetal kick counts Follow-up Information    [facility].   Specialty:  Obstetrics and Gynecology Why:  The office will call you with an appointment. Return to MAU as needed for emergencies. Contact information: [address]  [address] [place] [address] [phone]         Allergies as of 11/28/2017      Reactions   Other    Must have anti-emetic prior to being given any narcotics    Latex Rash      Medication List    STOP taking these medications   promethazine 25 MG tablet Commonly known as:  PHENERGAN   sertraline 50 MG tablet Commonly known as:  ZOLOFT     TAKE these medications   albuterol 108 (90 Base) MCG/ACT inhaler Commonly known as:  PROVENTIL HFA;VENTOLIN HFA Inhale 2 puffs into the lungs every 4 (four) hours as needed for wheezing or shortness of breath.   FLINSTONES GUMMIES OMEGA-3 DHA PO Take 1 tablet by mouth daily.   NIFEdipine 10 MG capsule Commonly known as:  PROCARDIA Take 1 capsule (10 mg total) by mouth every 4 (four) hours as needed.   ranitidine 150 MG tablet Commonly known as:  ZANTAC Take 150 mg by mouth 2 (two) times daily.       Sharen Counter Certified Nurse-Midwife 11/28/2017 7:00 PM

## 2017-11-28 NOTE — MAU Note (Signed)
Pt is a G1P0 at 28.2 weeks with h/o PTL symptoms in the past week.  Pt states since last night and has had increased pressure and cramping.  Positive FM, no LOF, no VB.

## 2017-12-02 ENCOUNTER — Encounter: Payer: Self-pay | Admitting: *Deleted

## 2017-12-20 ENCOUNTER — Inpatient Hospital Stay (HOSPITAL_COMMUNITY)
Admission: AD | Admit: 2017-12-20 | Discharge: 2017-12-21 | Disposition: A | Discharge status: Home / Self Care | Payer: 59 | Source: Ambulatory Visit | Attending: Obstetrics & Gynecology | Admitting: Obstetrics & Gynecology

## 2017-12-20 ENCOUNTER — Encounter (HOSPITAL_COMMUNITY): Payer: Self-pay | Admitting: *Deleted

## 2017-12-20 DIAGNOSIS — Z3A31 31 weeks gestation of pregnancy: Secondary | ICD-10-CM

## 2017-12-20 DIAGNOSIS — O26893 Other specified pregnancy related conditions, third trimester: Secondary | ICD-10-CM

## 2017-12-20 DIAGNOSIS — R109 Unspecified abdominal pain: Secondary | ICD-10-CM | POA: Insufficient documentation

## 2017-12-20 DIAGNOSIS — R1030 Lower abdominal pain, unspecified: Secondary | ICD-10-CM | POA: Diagnosis not present

## 2017-12-20 DIAGNOSIS — O26892 Other specified pregnancy related conditions, second trimester: Secondary | ICD-10-CM

## 2017-12-20 DIAGNOSIS — O210 Mild hyperemesis gravidarum: Secondary | ICD-10-CM

## 2017-12-20 DIAGNOSIS — R102 Pelvic and perineal pain unspecified side: Secondary | ICD-10-CM

## 2017-12-20 DIAGNOSIS — O26899 Other specified pregnancy related conditions, unspecified trimester: Secondary | ICD-10-CM

## 2017-12-20 DIAGNOSIS — N949 Unspecified condition associated with female genital organs and menstrual cycle: Secondary | ICD-10-CM

## 2017-12-20 LAB — URINALYSIS, ROUTINE W REFLEX MICROSCOPIC
BILIRUBIN URINE: NEGATIVE
Glucose, UA: NEGATIVE mg/dL
HGB URINE DIPSTICK: NEGATIVE
Ketones, ur: NEGATIVE mg/dL
Leukocytes, UA: NEGATIVE
Nitrite: NEGATIVE
PH: 6 (ref 5.0–8.0)
Protein, ur: NEGATIVE mg/dL
SPECIFIC GRAVITY, URINE: 1.017 (ref 1.005–1.030)

## 2017-12-20 LAB — WET PREP, GENITAL
Clue Cells Wet Prep HPF POC: NONE SEEN
SPERM: NONE SEEN
Trich, Wet Prep: NONE SEEN
Yeast Wet Prep HPF POC: NONE SEEN

## 2017-12-20 MED ORDER — NIFEDIPINE 10 MG PO CAPS: 10.0000 mg | ORAL_CAPSULE | Freq: Four times a day (QID) | ORAL | 0 refills | Status: DC

## 2017-12-20 MED ORDER — NIFEDIPINE 10 MG PO CAPS
10.0000 mg | ORAL_CAPSULE | ORAL | Status: AC
  Administered 2017-12-20 (×2): 10 mg via ORAL
  Filled 2017-12-20 (×3): qty 1

## 2017-12-20 NOTE — MAU Provider Note (Signed)
History     CSN: [id number]  Arrival date and time: 12/20/17 [id number]   First Provider Initiated Contact with Patient 12/20/17 2127      Chief Complaint  Patient presents with  . Abdominal Pain  . Vaginal Bleeding   HPI  Ms.  Janet Planaylor Rich is a 22 y.o. year old 311P0000 female at 4159w3d weeks gestation who presents to MAU reporting pink spotting and cramping. She has had PTL in this pregnancy and was Rx'd Procardia, but she has not taken it for the cramping. She was a patient at [facility] until her mom's insurance dropped her maternity coverage. [facility] on [facility]/13. She denies any LOF. She also reports good (+) FM today.  *She started taking an antibiotic for a sinus infection today.  Past Medical History:  Diagnosis Date  . Anemia    with periods  . Ankle fracture    right  . Anxiety   . Asthma   . Back pain   . Depression   . Endometriosis   . GERD (gastroesophageal reflux disease)   . Headache   . Ovarian cyst   . Ovarian cyst   . PCOS (polycystic ovarian syndrome)   . Pneumonia    age 89  . PONV (postoperative nausea and vomiting)    nausea after wisdom teeth extraction  . Vaginal Pap smear, abnormal    bx ok    Past Surgical History:  Procedure Laterality Date  . ROBOTIC ASSISTED DIAGNOSTIC LAPAROSCOPY N/A 02/18/2017   Procedure: ROBOTIC ASSISTED DIAGNOSTIC LAPAROSCOPY,EXCISION AND ABLATION OF ENDOMETRIOSIS,LYSIS OF ADHESIONS;  Surgeon: Olivia Mackieichard Taavon, MD;  Location: [facility];  Service: Gynecology;  Laterality: N/A;  . WISDOM TOOTH EXTRACTION  2013    Family History  Problem Relation Age of Onset  . Endometriosis Mother   . Diabetes Mother   . Breast cancer Paternal Grandmother   . Cancer Paternal Grandmother   . Schizophrenia Father   . Heart disease Neg Hx   . Colon cancer Neg Hx   . Ovarian cancer Neg Hx     Social History   Tobacco Use  . Smoking status: Former Smoker    Types: Cigarettes    Last attempt to quit: 12/20/2009    Years since  quitting: 8.0  . Smokeless tobacco: Never Used  . Tobacco comment: age [age], quit  Substance Use Topics  . Alcohol use: Yes    Comment: occassionally  . Drug use: No    Allergies:  Allergies  Allergen Reactions  . Other     Must have anti-emetic prior to being given any narcotics   . Latex Rash    Medications Prior to Admission  Medication Sig Dispense Refill Last Dose  . NIFEdipine (PROCARDIA) 10 MG capsule Take 1 capsule (10 mg total) by mouth every 4 (four) hours as needed. 30 capsule 0 Past Week at Unknown time  . Pediatric Multiple Vit-C-FA (FLINSTONES GUMMIES OMEGA-3 DHA PO) Take 1 tablet by mouth daily.   Past Week at Unknown time  . ranitidine (ZANTAC) 150 MG tablet Take 150 mg by mouth 2 (two) times daily.   12/20/2017 at Unknown time  . albuterol (PROVENTIL HFA;VENTOLIN HFA) 108 (90 Base) MCG/ACT inhaler Inhale 2 puffs into the lungs every 4 (four) hours as needed for wheezing or shortness of breath. 1 Inhaler 1 More than a month at Unknown time    Review of Systems  Constitutional: Negative.   HENT: Positive for sinus pressure and sinus pain.  Eyes: Negative.   Respiratory: Negative.   Cardiovascular: Negative.   Gastrointestinal: Positive for abdominal pain (lower abd cramping).  Endocrine: Negative.   Genitourinary: Positive for pelvic pain and vaginal bleeding ("pink spotting").  Musculoskeletal: Negative.   Skin: Negative.   Allergic/Immunologic: Negative.   Neurological: Negative.   Hematological: Negative.   Psychiatric/Behavioral: Negative.    Physical Exam   Blood pressure 115/67, pulse 88, temperature 98.2 F (36.8 C), temperature source Oral, resp. rate 18, height 5\' 1"  (1.549 m), weight 208 lb (94.3 kg), last menstrual period 05/10/2017, SpO2 100 %.  Physical Exam  Nursing note and vitals reviewed. Constitutional: She is oriented to person, place, and time. She appears well-developed and well-nourished.  HENT:  Head: Normocephalic and  atraumatic.  Eyes: Pupils are equal, round, and reactive to light.  Neck: Normal range of motion.  Cardiovascular: Normal rate, regular rhythm, normal heart sounds and intact distal pulses.  Respiratory: Effort normal and breath sounds normal.  GI: Soft. Bowel sounds are normal.  Genitourinary:  Genitourinary Comments: Uterus: gravid, S=D, cx: smooth, pink, no lesions, moderate amt of thick, yellowish-white vaginal d/c -- wet prep done, closed/long/firm, no CMT or friability, no adnexal tenderness   Musculoskeletal: Normal range of motion.  Neurological: She is alert and oriented to person, place, and time. She has normal reflexes.  Skin: Skin is warm and dry.  Psychiatric: She has a normal mood and affect. Her behavior is normal. Judgment and thought content normal.    MAU Course  Procedures  MDM CCUA Wet Prep Procardia 10 mg every 20 mins x 3 doses -- improved cramping NST - FHR: 150 bpm / moderate variability / accels present / decels absent / TOCO: regular every 3-5 mins   Results for orders placed or performed during the hospital encounter of 12/20/17 (from the past 24 hour(s))  Urinalysis, Routine w reflex microscopic     Status: Abnormal   Collection Time: 12/20/17  8:26 PM  Result Value Ref Range   Color, Urine YELLOW YELLOW   APPearance CLOUDY (A) CLEAR   Specific Gravity, Urine 1.017 1.005 - 1.030   pH 6.0 5.0 - 8.0   Glucose, UA NEGATIVE NEGATIVE mg/dL   Hgb urine dipstick NEGATIVE NEGATIVE   Bilirubin Urine NEGATIVE NEGATIVE   Ketones, ur NEGATIVE NEGATIVE mg/dL   Protein, ur NEGATIVE NEGATIVE mg/dL   Nitrite NEGATIVE NEGATIVE   Leukocytes, UA NEGATIVE NEGATIVE  Wet prep, genital     Status: Abnormal   Collection Time: 12/20/17  9:20 PM  Result Value Ref Range   Yeast Wet Prep HPF POC NONE SEEN NONE SEEN   Trich, Wet Prep NONE SEEN NONE SEEN   Clue Cells Wet Prep HPF POC NONE SEEN NONE SEEN   WBC, Wet Prep HPF POC MANY (A) NONE SEEN   Sperm NONE SEEN      Assessment and Plan  Pregnancy with abdominal cramping of lower quadrant, antepartum - Plan: Discharge patient - Rx renewed for Procardia 10 mg every 6 hrs prn preterm UC's - [facility]B app[facility]/13 at [facility] - Stay well-hydrated - Rest as much as possible Discharge home Patient verbalized an understanding of the plan of care and agrees.   Raelyn Mora, MSN, CNM 12/20/2017, 9:37 PM

## 2017-12-20 NOTE — MAU Note (Signed)
Pt here with c/o abdominal and back pain and vaginal pain. Spotting today, light pink. Feel like she may be leaking some fluid, but no big gush.

## 2017-12-21 DIAGNOSIS — O26893 Other specified pregnancy related conditions, third trimester: Secondary | ICD-10-CM | POA: Diagnosis not present

## 2017-12-28 ENCOUNTER — Inpatient Hospital Stay (HOSPITAL_COMMUNITY)
Admission: AD | Admit: 2017-12-28 | Discharge: 2017-12-29 | Disposition: A | Discharge status: Home / Self Care | Payer: 59 | Source: Ambulatory Visit | Attending: Family Medicine | Admitting: Family Medicine

## 2017-12-28 ENCOUNTER — Other Ambulatory Visit: Payer: Self-pay

## 2017-12-28 ENCOUNTER — Encounter (HOSPITAL_COMMUNITY): Payer: Self-pay

## 2017-12-28 DIAGNOSIS — Z87891 Personal history of nicotine dependence: Secondary | ICD-10-CM | POA: Insufficient documentation

## 2017-12-28 DIAGNOSIS — E282 Polycystic ovarian syndrome: Secondary | ICD-10-CM | POA: Diagnosis not present

## 2017-12-28 DIAGNOSIS — O4703 False labor before 37 completed weeks of gestation, third trimester: Secondary | ICD-10-CM | POA: Diagnosis not present

## 2017-12-28 DIAGNOSIS — A084 Viral intestinal infection, unspecified: Secondary | ICD-10-CM | POA: Insufficient documentation

## 2017-12-28 DIAGNOSIS — F329 Major depressive disorder, single episode, unspecified: Secondary | ICD-10-CM | POA: Insufficient documentation

## 2017-12-28 DIAGNOSIS — Z3A32 32 weeks gestation of pregnancy: Secondary | ICD-10-CM | POA: Diagnosis not present

## 2017-12-28 DIAGNOSIS — O3483 Maternal care for other abnormalities of pelvic organs, third trimester: Secondary | ICD-10-CM | POA: Diagnosis not present

## 2017-12-28 DIAGNOSIS — Z3689 Encounter for other specified antenatal screening: Secondary | ICD-10-CM

## 2017-12-28 DIAGNOSIS — O99613 Diseases of the digestive system complicating pregnancy, third trimester: Secondary | ICD-10-CM | POA: Diagnosis not present

## 2017-12-28 DIAGNOSIS — R109 Unspecified abdominal pain: Secondary | ICD-10-CM | POA: Diagnosis present

## 2017-12-28 DIAGNOSIS — O99283 Endocrine, nutritional and metabolic diseases complicating pregnancy, third trimester: Secondary | ICD-10-CM | POA: Diagnosis not present

## 2017-12-28 DIAGNOSIS — E860 Dehydration: Secondary | ICD-10-CM | POA: Diagnosis not present

## 2017-12-28 DIAGNOSIS — O99343 Other mental disorders complicating pregnancy, third trimester: Secondary | ICD-10-CM | POA: Insufficient documentation

## 2017-12-28 DIAGNOSIS — O99513 Diseases of the respiratory system complicating pregnancy, third trimester: Secondary | ICD-10-CM | POA: Insufficient documentation

## 2017-12-28 DIAGNOSIS — K219 Gastro-esophageal reflux disease without esophagitis: Secondary | ICD-10-CM | POA: Insufficient documentation

## 2017-12-28 DIAGNOSIS — Z79899 Other long term (current) drug therapy: Secondary | ICD-10-CM | POA: Insufficient documentation

## 2017-12-28 DIAGNOSIS — J45909 Unspecified asthma, uncomplicated: Secondary | ICD-10-CM | POA: Diagnosis not present

## 2017-12-28 DIAGNOSIS — F419 Anxiety disorder, unspecified: Secondary | ICD-10-CM | POA: Diagnosis not present

## 2017-12-28 LAB — URINALYSIS, ROUTINE W REFLEX MICROSCOPIC
BACTERIA UA: NONE SEEN
BILIRUBIN URINE: NEGATIVE
Glucose, UA: NEGATIVE mg/dL
HGB URINE DIPSTICK: NEGATIVE
Ketones, ur: 80 mg/dL — AB
LEUKOCYTES UA: NEGATIVE
NITRITE: NEGATIVE
PH: 5 (ref 5.0–8.0)
Protein, ur: 30 mg/dL — AB
SPECIFIC GRAVITY, URINE: 1.026 (ref 1.005–1.030)

## 2017-12-28 LAB — CBC
HEMATOCRIT: 38 % (ref 36.0–46.0)
Hemoglobin: 12.5 g/dL (ref 12.0–15.0)
MCH: 25.7 pg — ABNORMAL LOW (ref 26.0–34.0)
MCHC: 32.9 g/dL (ref 30.0–36.0)
MCV: 78 fL (ref 78.0–100.0)
Platelets: 320 10*3/uL (ref 150–400)
RBC: 4.87 MIL/uL (ref 3.87–5.11)
RDW: 15.2 % (ref 11.5–15.5)
WBC: 12.3 10*3/uL — AB (ref 4.0–10.5)

## 2017-12-28 MED ORDER — LACTATED RINGERS IV BOLUS (SEPSIS)
1000.0000 mL | Freq: Once | INTRAVENOUS | Status: AC
  Administered 2017-12-28: 1000 mL via INTRAVENOUS

## 2017-12-28 MED ORDER — SODIUM CHLORIDE 0.9 % IV SOLN
8.0000 mg | Freq: Once | INTRAVENOUS | Status: AC
  Administered 2017-12-28: 8 mg via INTRAVENOUS
  Filled 2017-12-28: qty 4

## 2017-12-28 MED ORDER — DICYCLOMINE HCL 10 MG/ML IM SOLN
10.0000 mg | Freq: Once | INTRAMUSCULAR | Status: AC
  Administered 2017-12-28: 10 mg via INTRAMUSCULAR
  Filled 2017-12-28: qty 1

## 2017-12-28 NOTE — Progress Notes (Addendum)
G1 @ 32.[redacted]  wksga. Here due to N/v that started 1130ish. Denies LOF and bleeding. Decreased FM today. EFM applied.   2256 Provider at bs assessing.   2257: FFN done. SVE closed. FFN not sent. Ordered for IV

## 2017-12-28 NOTE — MAU Provider Note (Signed)
History     CSN: [id number]  Arrival date and time: 12/28/17 2218   First Provider Initiated Contact with Patient 12/28/17 2252      Chief Complaint  Patient presents with  . Abdominal Pain  . Contractions  . Emesis   G1 @32 .4 wks here with N/V/D, and ctx. Sx started around 11 am today. She is unable to tolerate anything po. No fevers. No sick contacts. She had eaten [employer] just prior to onset. Ctx started later in the day. She is unsure of frequency because she is also having intestinal cramping. No VB or LOF. Also reports less FM today.    OB History    Gravida Para Term Preterm AB Living   1 0 0 0 0 0   SAB TAB Ectopic Multiple Live Births   0 0 0 0        Past Medical History:  Diagnosis Date  . Anemia    with periods  . Ankle fracture    right  . Anxiety   . Asthma   . Back pain   . Depression   . Endometriosis   . GERD (gastroesophageal reflux disease)   . Headache   . Ovarian cyst   . Ovarian cyst   . PCOS (polycystic ovarian syndrome)   . Pneumonia    age 67  . PONV (postoperative nausea and vomiting)    nausea after wisdom teeth extraction  . Vaginal Pap smear, abnormal    bx ok    Past Surgical History:  Procedure Laterality Date  . ROBOTIC ASSISTED DIAGNOSTIC LAPAROSCOPY N/A 02/18/2017   Procedure: ROBOTIC ASSISTED DIAGNOSTIC LAPAROSCOPY,EXCISION AND ABLATION OF ENDOMETRIOSIS,LYSIS OF ADHESIONS;  Surgeon: Olivia Mackie, MD;  Location: [facility];  Service: Gynecology;  Laterality: N/A;  . WISDOM TOOTH EXTRACTION  2013    Family History  Problem Relation Age of Onset  . Endometriosis Mother   . Diabetes Mother   . Breast cancer Paternal Grandmother   . Cancer Paternal Grandmother   . Schizophrenia Father   . Heart disease Neg Hx   . Colon cancer Neg Hx   . Ovarian cancer Neg Hx     Social History   Tobacco Use  . Smoking status: Former Smoker    Types: Cigarettes    Last attempt to quit: 12/20/2009    Years since quitting: 8.0  .  Smokeless tobacco: Never Used  . Tobacco comment: age [age], quit  Substance Use Topics  . Alcohol use: Yes    Comment: occassionally  . Drug use: No    Allergies:  Allergies  Allergen Reactions  . Other     Must have anti-emetic prior to being given any narcotics   . Latex Rash    No medications prior to admission.    Review of Systems  Constitutional: Negative for fever.  Gastrointestinal: Positive for abdominal pain, diarrhea, nausea and vomiting.  Genitourinary: Negative for vaginal bleeding and vaginal discharge.   Physical Exam   Blood pressure 109/79, pulse (!) 107, temperature 99.7 F (37.6 C), temperature source Oral, resp. rate 18, height 5\' 1"  (1.549 m), weight 207 lb (93.9 kg), last menstrual period 05/10/2017, SpO2 100 %.  Physical Exam  Nursing note and vitals reviewed. Constitutional: She is oriented to person, place, and time. She appears well-developed and well-nourished. No distress.  HENT:  Head: Normocephalic and atraumatic.  Neck: Normal range of motion.  Cardiovascular:  tachy  Respiratory: Effort normal. No respiratory distress.  GI: Soft. She exhibits no  distension and no mass. There is no tenderness. There is no rebound and no guarding.  gravid  Genitourinary:  Genitourinary Comments: Cervix closed/long  Musculoskeletal: Normal range of motion.  Neurological: She is alert and oriented to person, place, and time.  Skin: Skin is warm and dry.  Psychiatric: She has a normal mood and affect.  EFM: 145 bpm, mod variability, + accels, no decels Toco: irritability  Results for orders placed or performed during the hospital encounter of 12/28/17 (from the past 24 hour(s))  Urinalysis, Routine w reflex microscopic     Status: Abnormal   Collection Time: 12/28/17 10:41 PM  Result Value Ref Range   Color, Urine YELLOW YELLOW   APPearance HAZY (A) CLEAR   Specific Gravity, Urine 1.026 1.005 - 1.030   pH 5.0 5.0 - 8.0   Glucose, UA NEGATIVE  NEGATIVE mg/dL   Hgb urine dipstick NEGATIVE NEGATIVE   Bilirubin Urine NEGATIVE NEGATIVE   Ketones, ur 80 (A) NEGATIVE mg/dL   Protein, ur 30 (A) NEGATIVE mg/dL   Nitrite NEGATIVE NEGATIVE   Leukocytes, UA NEGATIVE NEGATIVE   RBC / HPF 0-5 0 - 5 RBC/hpf   WBC, UA 0-5 0 - 5 WBC/hpf   Bacteria, UA NONE SEEN NONE SEEN   Squamous Epithelial / LPF 6-30 (A) NONE SEEN   Mucus PRESENT   CBC     Status: Abnormal   Collection Time: 12/28/17 11:18 PM  Result Value Ref Range   WBC 12.3 (H) 4.0 - 10.5 K/uL   RBC 4.87 3.87 - 5.11 MIL/uL   Hemoglobin 12.5 12.0 - 15.0 g/dL   HCT 16.138.0 09.636.0 - 04.546.0 %   MCV 78.0 78.0 - 100.0 fL   MCH 25.7 (L) 26.0 - 34.0 pg   MCHC 32.9 30.0 - 36.0 g/dL   RDW 40.915.2 81.111.5 - 91.415.5 %   Platelets 320 150 - 400 K/uL  Comprehensive metabolic panel     Status: Abnormal   Collection Time: 12/28/17 11:18 PM  Result Value Ref Range   Sodium 132 (L) 135 - 145 mmol/L   Potassium 4.0 3.5 - 5.1 mmol/L   Chloride 101 101 - 111 mmol/L   CO2 19 (L) 22 - 32 mmol/L   Glucose, Bld 87 65 - 99 mg/dL   BUN 8 6 - 20 mg/dL   Creatinine, Ser 7.820.35 (L) 0.44 - 1.00 mg/dL   Calcium 8.8 (L) 8.9 - 10.3 mg/dL   Total Protein 6.7 6.5 - 8.1 g/dL   Albumin 2.8 (L) 3.5 - 5.0 g/dL   AST 15 15 - 41 U/L   ALT 11 (L) 14 - 54 U/L   Alkaline Phosphatase 89 38 - 126 U/L   Total Bilirubin 0.5 0.3 - 1.2 mg/dL   GFR calc non Af Amer >60 >60 mL/min   GFR calc Af Amer >60 >60 mL/min   Anion gap 12 5 - 15   MAU Course  Procedures LR Zofran Bentyl  MDM Labs ordered and reviewed. Feeling better, nausea improved. Tolerating po. No emesis. Had another episode of diarrhea. No evidence of PTL. Stable for discharge home.   Assessment and Plan   1. [redacted] weeks gestation of pregnancy   2. NST (non-stress test) reactive   3. Viral gastroenteritis   4. Preterm uterine contractions in third trimester, antepartum   5. Dehydration    Discharge home Follow up in OB office as scheduled Rx Zofran Hydrate PTL  precautions  Allergies as of 12/29/2017      Reactions  Other    Must have anti-emetic prior to being given any narcotics    Latex Rash      Medication List    TAKE these medications   albuterol 108 (90 Base) MCG/ACT inhaler Commonly known as:  PROVENTIL HFA;VENTOLIN HFA Inhale 2 puffs into the lungs every 4 (four) hours as needed for wheezing or shortness of breath.   FLINSTONES GUMMIES OMEGA-3 DHA PO Take 1 tablet by mouth daily.   NIFEdipine 10 MG capsule Commonly known as:  PROCARDIA Take 1 capsule (10 mg total) by mouth 4 (four) times daily. What changed:  Another medication with the same name was removed. Continue taking this medication, and follow the directions you see here.   ondansetron 4 MG disintegrating tablet Commonly known as:  ZOFRAN ODT Take 1 tablet (4 mg total) by mouth every 8 (eight) hours as needed for nausea or vomiting.   ranitidine 150 MG tablet Commonly known as:  ZANTAC Take 150 mg by mouth 2 (two) times daily.      Donette Larry, CNM 12/29/2017, 2:16 AM

## 2017-12-29 ENCOUNTER — Encounter: Payer: Self-pay | Admitting: Student

## 2017-12-29 ENCOUNTER — Ambulatory Visit (INDEPENDENT_AMBULATORY_CARE_PROVIDER_SITE_OTHER): Payer: 59 | Admitting: Student

## 2017-12-29 DIAGNOSIS — O99613 Diseases of the digestive system complicating pregnancy, third trimester: Secondary | ICD-10-CM | POA: Diagnosis not present

## 2017-12-29 DIAGNOSIS — Z3483 Encounter for supervision of other normal pregnancy, third trimester: Secondary | ICD-10-CM

## 2017-12-29 DIAGNOSIS — Z3480 Encounter for supervision of other normal pregnancy, unspecified trimester: Secondary | ICD-10-CM

## 2017-12-29 LAB — COMPREHENSIVE METABOLIC PANEL
ALBUMIN: 2.8 g/dL — AB (ref 3.5–5.0)
ALT: 11 U/L — ABNORMAL LOW (ref 14–54)
AST: 15 U/L (ref 15–41)
Alkaline Phosphatase: 89 U/L (ref 38–126)
Anion gap: 12 (ref 5–15)
BILIRUBIN TOTAL: 0.5 mg/dL (ref 0.3–1.2)
BUN: 8 mg/dL (ref 6–20)
CALCIUM: 8.8 mg/dL — AB (ref 8.9–10.3)
CO2: 19 mmol/L — ABNORMAL LOW (ref 22–32)
Chloride: 101 mmol/L (ref 101–111)
Creatinine, Ser: 0.35 mg/dL — ABNORMAL LOW (ref 0.44–1.00)
GFR calc Af Amer: >60 mL/min (ref >60)
GFR calc non Af Amer: >60 mL/min (ref >60)
GLUCOSE: 87 mg/dL (ref 65–99)
Potassium: 4 mmol/L (ref 3.5–5.1)
Sodium: 132 mmol/L — ABNORMAL LOW (ref 135–145)
TOTAL PROTEIN: 6.7 g/dL (ref 6.5–8.1)

## 2017-12-29 LAB — TOXOPLASMA GONDII ANTIBODY, IGG

## 2017-12-29 MED ORDER — FAMOTIDINE IN NACL 20-0.9 MG/50ML-% IV SOLN
20.0000 mg | Freq: Once | INTRAVENOUS | Status: AC
Start: 2017-12-29 — End: 2017-12-29
  Administered 2017-12-29: 20 mg via INTRAVENOUS
  Filled 2017-12-29: qty 50

## 2017-12-29 MED ORDER — ONDANSETRON 4 MG PO TBDP: 4.0000 mg | ORAL_TABLET | Freq: Three times a day (TID) | ORAL | 0 refills | Status: DC | PRN

## 2017-12-29 NOTE — Progress Notes (Signed)
Started procardia about a month ago d/t contractions.

## 2017-12-29 NOTE — Discharge Instructions (Signed)
° °Food Choices to Help Relieve Diarrhea, Adult °When you have diarrhea, the foods you eat and your eating habits are very important. Choosing the right foods and drinks can help: °· Relieve diarrhea. °· Replace lost fluids and nutrients. °· Prevent dehydration. ° °What general guidelines should I follow? °Relieving diarrhea °· Choose foods with less than 2 g or .07 oz. of fiber per serving. °· Limit fats to less than 8 tsp (38 g or 1.34 oz.) a day. °· Avoid the following: °? Foods and beverages sweetened with high-fructose corn syrup, honey, or sugar alcohols such as xylitol, sorbitol, and mannitol. °? Foods that contain a lot of fat or sugar. °? Fried, greasy, or spicy foods. °? High-fiber grains, breads, and cereals. °? Raw fruits and vegetables. °· Eat foods that are rich in probiotics. These foods include dairy products such as yogurt and fermented milk products. They help increase healthy bacteria in the stomach and intestines (gastrointestinal tract, or GI tract). °· If you have lactose intolerance, avoid dairy products. These may make your diarrhea worse. °· Take medicine to help stop diarrhea (antidiarrheal medicine) only as told by your health care provider. °Replacing nutrients °· Eat small meals or snacks every 3-4 hours. °· Eat bland foods, such as white rice, toast, or baked potato, until your diarrhea starts to get better. Gradually reintroduce nutrient-rich foods as tolerated or as told by your health care provider. This includes: °? Well-cooked protein foods. °? Peeled, seeded, and soft-cooked fruits and vegetables. °? Low-fat dairy products. °· Take vitamin and mineral supplements as told by your health care provider. °Preventing dehydration ° °· Start by sipping water or a special solution to prevent dehydration (oral rehydration solution, ORS). Urine that is clear or pale yellow means that you are getting enough fluid. °· Try to drink at least 8-10 cups of fluid each day to help replace lost  fluids. °· You may add other liquids in addition to water, such as clear juice or decaffeinated sports drinks, as tolerated or as told by your health care provider. °· Avoid drinks with caffeine, such as coffee, tea, or soft drinks. °· Avoid alcohol. °What foods are recommended? °The items listed may not be a complete list. Talk with your health care provider about what dietary choices are best for you. °Grains °White rice. White, French, or pita breads (fresh or toasted), including plain rolls, buns, or bagels. White pasta. Saltine, soda, or graham crackers. Pretzels. Low-fiber cereal. Cooked cereals made with water (such as cornmeal, farina, or cream cereals). Plain muffins. Matzo. Melba toast. Zwieback. °Vegetables °Potatoes (without the skin). Most well-cooked and canned vegetables without skins or seeds. Tender lettuce. °Fruits °Apple sauce. Fruits canned in juice. Cooked apricots, cherries, grapefruit, peaches, pears, or plums. Fresh bananas and cantaloupe. °Meats and other protein foods °Baked or boiled chicken. Eggs. Tofu. Fish. Seafood. Smooth nut butters. Ground or well-cooked tender beef, ham, veal, lamb, pork, or poultry. °Dairy °Plain yogurt, kefir, and unsweetened liquid yogurt. Lactose-free milk, buttermilk, skim milk, or soy milk. Low-fat or nonfat hard cheese. °Beverages °Water. Low-calorie sports drinks. Fruit juices without pulp. Strained tomato and vegetable juices. Decaffeinated teas. Sugar-free beverages not sweetened with sugar alcohols. Oral rehydration solutions, if approved by your health care provider. °Seasoning and other foods °Bouillon, broth, or soups made from recommended foods. °What foods are not recommended? °The items listed may not be a complete list. Talk with your health care provider about what dietary choices are best for you. °Grains °Whole grain, whole   wheat, bran, or rye breads, rolls, pastas, and crackers. Wild or brown rice. Whole grain or bran cereals. Barley. Oats and  oatmeal. Corn tortillas or taco shells. Granola. Popcorn. °Vegetables °Raw vegetables. Fried vegetables. Cabbage, broccoli, Brussels sprouts, artichokes, baked beans, beet greens, corn, kale, legumes, peas, sweet potatoes, and yams. Potato skins. Cooked spinach and cabbage. °Fruits °Dried fruit, including raisins and dates. Raw fruits. Stewed or dried prunes. Canned fruits with syrup. °Meat and other protein foods °Fried or fatty meats. Deli meats. Chunky nut butters. Nuts and seeds. Beans and lentils. Bacon. Hot dogs. Sausage. °Dairy °High-fat cheeses. Whole milk, chocolate milk, and beverages made with milk, such as milk shakes. Half-and-half. Cream. sour cream. Ice cream. °Beverages °Caffeinated beverages (such as coffee, tea, soda, or energy drinks). Alcoholic beverages. Fruit juices with pulp. Prune juice. Soft drinks sweetened with high-fructose corn syrup or sugar alcohols. High-calorie sports drinks. °Fats and oils °Butter. Cream sauces. Margarine. Salad oils. Plain salad dressings. Olives. Avocados. Mayonnaise. °Sweets and desserts °Sweet rolls, doughnuts, and sweet breads. Sugar-free desserts sweetened with sugar alcohols such as xylitol and sorbitol. °Seasoning and other foods °Honey. Hot sauce. Chili powder. Gravy. Cream-based or milk-based soups. Pancakes and waffles. °Summary °· When you have diarrhea, the foods you eat and your eating habits are very important. °· Make sure you get at least 8-10 cups of fluid each day, or enough to keep your urine clear or pale yellow. °· Eat bland foods and gradually reintroduce healthy, nutrient-rich foods as tolerated, or as told by your health care provider. °· Avoid high-fiber, fried, greasy, or spicy foods. °This information is not intended to replace advice given to you by your health care provider. Make sure you discuss any questions you have with your health care provider. °Document Released: 12/26/2003 Document Revised: 10/02/2016 Document Reviewed:  10/02/2016 °Elsevier Interactive Patient Education © 2018 [employer] °Viral Gastroenteritis, Adult °Viral gastroenteritis is also known as the stomach flu. This condition is caused by certain germs (viruses). These germs can be passed from person to person very easily (are very contagious). This condition can cause sudden watery poop (diarrhea), fever, and throwing up (vomiting). °Having watery poop and throwing up can make you feel weak and cause you to get dehydrated. Dehydration can make you tired and thirsty, make you have a dry mouth, and make it so you pee (urinate) less often. Older adults and people with other diseases or a weak defense system (immune system) are at higher risk for dehydration. It is important to replace the fluids that you lose from having watery poop and throwing up. °Follow these instructions at home: °Follow instructions from your doctor about how to care for yourself at home. °Eating and drinking ° °Follow these instructions as told by your doctor: °· Take an oral rehydration solution (ORS). This is a drink that is sold at pharmacies and stores. °· Drink clear fluids in small amounts as you are able, such as: °? Water. °? Ice chips. °? Diluted fruit juice. °? Low-calorie sports drinks. °· Eat bland, easy-to-digest foods in small amounts as you are able, such as: °? Bananas. °? Applesauce. °? Rice. °? Low-fat (lean) meats. °? Toast. °? Crackers. °· Avoid fluids that have a lot of sugar or caffeine in them. °· Avoid alcohol. °· Avoid spicy or fatty foods. ° °General instructions °· Drink enough fluid to keep your pee (urine) clear or pale yellow. °· Wash your hands often. If you cannot use soap and water, use hand sanitizer. °· Make   sure that all people in your home wash their hands well and often. °· Rest at home while you get better. °· Take over-the-counter and prescription medicines only as told by your doctor. °· Watch your condition for any changes. °· Take a warm bath to help  with any burning or pain from having watery poop. °· Keep all follow-up visits as told by your doctor. This is important. °Contact a doctor if: °· You cannot keep fluids down. °· Your symptoms get worse. °· You have new symptoms. °· You feel light-headed or dizzy. °· You have muscle cramps. °Get help right away if: °· You have chest pain. °· You feel very weak or you pass out (faint). °· You see blood in your throw-up. °· Your throw-up looks like coffee grounds. °· You have bloody or black poop (stools) or poop that look like tar. °· You have a very bad headache, a stiff neck, or both. °· You have a rash. °· You have very bad pain, cramping, or bloating in your belly (abdomen). °· You have trouble breathing. °· You are breathing very quickly. °· Your heart is beating very quickly. °· Your skin feels cold and clammy. °· You feel confused. °· You have pain when you pee. °· You have signs of dehydration, such as: °? Dark pee, hardly any pee, or no pee. °? Cracked lips. °? Dry mouth. °? Sunken eyes. °? Sleepiness. °? Weakness. °This information is not intended to replace advice given to you by your health care provider. Make sure you discuss any questions you have with your health care provider. °Document Released: 03/23/2008 Document Revised: 04/24/2016 Document Reviewed: 06/11/2015 °Elsevier Interactive Patient Education © 2017 [employer] Inc. ° °

## 2017-12-29 NOTE — Patient Instructions (Signed)
CIRCUMCISION  Circumcision is considered an elective/non-medically necessary procedure. There are many reasons parents decide to have their sons circumsized. During the first year of life circumcised males have a reduced risk of urinary tract infections but after this year the rates between circumcised males and uncircumcised males are the same.  It is safe to have your son circumcised outside of the hospital and the places above perform them regularly.    Places to have your son circumcised:    [facility] [phone] $[phone] by 4 wks  [facility] [phone] $[phone] by 4 wks  [facility] [phone] $[phone] by 2 wks  [facility] [phone] $[phone] by 7 days [facility] [phone] $[phone] by 4 wks  These prices sometimes change but are roughly what you can expect to pay. Please call and confirm pricing.    

## 2017-12-30 NOTE — Progress Notes (Signed)
[name], female   DOB: 28-Mar-1996, 22 y.o.   MRN: [id number]

## 2017-12-30 NOTE — Progress Notes (Signed)
  Subjective:    Janet Rich is being seen today for her first obstetrical visit.  This is a planned pregnancy. She is at 8458w6d gestation. Her obstetrical history is significant for preterm contractions on procardia, PCOS and endometriosis. This pregnancy was conceived by Clomid. She was seen by  Dr. Billy Coasttaavon. She has also had surgery on her ovaries for endometriosis. . Relationship with FOB: spouse, living together. Patient does intend to breast feed. Pregnancy history fully reviewed.  Patient reports contractions. She takes procardia.  Review of Systems:   Review of Systems  Constitutional: Negative.   HENT: Negative.   Respiratory: Negative.   Cardiovascular: Negative.   Gastrointestinal: Negative.   Genitourinary: Negative.   Musculoskeletal: Negative.   Neurological: Negative.   Hematological: Negative.   Psychiatric/Behavioral: Negative.     Objective:      BP 116/78   Pulse 89   Wt 206 lb (93.4 kg)   LMP 05/10/2017   BMI 38.92 kg/m  Physical Exam  Constitutional: She is oriented to person, place, and time.  HENT:  Head: Normocephalic.  Neck: Normal range of motion.  Cardiovascular: Normal rate.  Respiratory: Effort normal.  GI: Soft. Bowel sounds are normal.  Genitourinary: Vagina normal.  Neurological: She is alert and oriented to person, place, and time.  Skin: Skin is warm and dry.  Psychiatric: She has a normal mood and affect.    Exam    Assessment:    Pregnancy: G1P0000 Patient Active Problem List   Diagnosis Date Noted  . Supervision of other normal pregnancy, antepartum 12/29/2017  . Pregnancy with abdominal cramping of lower quadrant, antepartum 12/20/2017  . Pelvic pressure in pregnancy, antepartum, second trimester 09/15/2017  . Morning sickness 07/13/2017  . Dysmenorrhea 05/01/2015  . History of migraine headaches 05/01/2015  . Anxiety and depression 05/01/2015  . Increased BMI (body mass index) 05/01/2015  . Family history of  endometriosis in first degree relative 05/01/2015       Plan:     Initial labs drawn. Prenatal vitamins. Problem list reviewed and updated. AFP3 discussed: Not indicated Role of ultrasound in pregnancy discussed; fetal survey: will order follow up US to review anatomy. Amniocentesis discussed: not indicated. Follow up in 2 weeks. 50% of 30 min visit spent on counseling and coordination of care.  -welcomed patient to practice, explained role of students, CNM, NP and PAs -Continue on procardia daily as patient states it is helping her  Marylene LandKathryn Lorraine Andres Bantz 12/30/2017

## 2018-01-04 ENCOUNTER — Ambulatory Visit (HOSPITAL_COMMUNITY)
Admission: RE | Admit: 2018-01-04 | Discharge: 2018-01-04 | Disposition: A | Discharge status: Home / Self Care | Payer: 59 | Source: Ambulatory Visit | Attending: Student | Admitting: Student

## 2018-01-04 ENCOUNTER — Other Ambulatory Visit: Payer: Self-pay | Admitting: Student

## 2018-01-04 ENCOUNTER — Encounter (HOSPITAL_COMMUNITY): Payer: Self-pay

## 2018-01-04 DIAGNOSIS — O210 Mild hyperemesis gravidarum: Secondary | ICD-10-CM

## 2018-01-04 DIAGNOSIS — O283 Abnormal ultrasonic finding on antenatal screening of mother: Secondary | ICD-10-CM | POA: Diagnosis not present

## 2018-01-04 DIAGNOSIS — Z3689 Encounter for other specified antenatal screening: Secondary | ICD-10-CM | POA: Diagnosis not present

## 2018-01-04 DIAGNOSIS — O358XX0 Maternal care for other (suspected) fetal abnormality and damage, not applicable or unspecified: Secondary | ICD-10-CM

## 2018-01-04 DIAGNOSIS — Z3480 Encounter for supervision of other normal pregnancy, unspecified trimester: Secondary | ICD-10-CM

## 2018-01-04 DIAGNOSIS — O99213 Obesity complicating pregnancy, third trimester: Secondary | ICD-10-CM | POA: Insufficient documentation

## 2018-01-04 DIAGNOSIS — O26899 Other specified pregnancy related conditions, unspecified trimester: Secondary | ICD-10-CM

## 2018-01-04 DIAGNOSIS — O09813 Supervision of pregnancy resulting from assisted reproductive technology, third trimester: Secondary | ICD-10-CM | POA: Insufficient documentation

## 2018-01-04 DIAGNOSIS — N949 Unspecified condition associated with female genital organs and menstrual cycle: Secondary | ICD-10-CM

## 2018-01-04 DIAGNOSIS — R1030 Lower abdominal pain, unspecified: Secondary | ICD-10-CM

## 2018-01-04 DIAGNOSIS — Z3A33 33 weeks gestation of pregnancy: Secondary | ICD-10-CM | POA: Diagnosis not present

## 2018-01-04 DIAGNOSIS — O35EXX0 Maternal care for other (suspected) fetal abnormality and damage, fetal genitourinary anomalies, not applicable or unspecified: Secondary | ICD-10-CM

## 2018-01-04 DIAGNOSIS — O26892 Other specified pregnancy related conditions, second trimester: Secondary | ICD-10-CM

## 2018-01-11 ENCOUNTER — Ambulatory Visit (INDEPENDENT_AMBULATORY_CARE_PROVIDER_SITE_OTHER): Payer: 59 | Admitting: Obstetrics & Gynecology

## 2018-01-11 ENCOUNTER — Encounter: Payer: Self-pay | Admitting: Obstetrics & Gynecology

## 2018-01-11 VITALS — BP 119/80 | HR 82 | Wt 208.0 lb

## 2018-01-11 DIAGNOSIS — B372 Candidiasis of skin and nail: Secondary | ICD-10-CM

## 2018-01-11 DIAGNOSIS — Z3480 Encounter for supervision of other normal pregnancy, unspecified trimester: Secondary | ICD-10-CM

## 2018-01-11 MED ORDER — NYSTATIN 100000 UNIT/GM EX CREA: 1.0000 "application " | TOPICAL_CREAM | Freq: Three times a day (TID) | CUTANEOUS | 3 refills | Status: DC

## 2018-01-11 NOTE — Progress Notes (Signed)
   PRENATAL VISIT NOTE  Subjective:  Janet Rich is a 22 y.o. G1P0000 at 6846w4d being seen today for ongoing prenatal care.  She is currently monitored for the following issues for this low-risk pregnancy and has Dysmenorrhea; History of migraine headaches; Anxiety and depression; Increased BMI (body mass index); Family history of endometriosis in first degree relative; Morning sickness; Pelvic pressure in pregnancy, antepartum, second trimester; Pregnancy with abdominal cramping of lower quadrant, antepartum; Supervision of other normal pregnancy, antepartum; and Preterm labor on their problem list.  Patient reports rash in her groin area that she would like evaluated that has not been relieved by using Desitin and other OTC medications.  She would also like to talk about a general birth plan, interested in water birth. Contractions: Irregular. Vag. Bleeding: None.  Movement: Present. Denies leaking of fluid.   The following portions of the patient's history were reviewed and updated as appropriate: allergies, current medications, past family history, past medical history, past social history, past surgical history and problem list. Problem list updated.  Objective:   Vitals:   01/11/18 1438  BP: 119/80  Pulse: 82  Weight: 208 lb (94.3 kg)    Fetal Status: Fetal Heart Rate (bpm): 135 Fundal Height: 32 cm Movement: Present     General:  Alert, oriented and cooperative. Patient is in no acute distress.  Skin: Skin is warm and dry. Erythematous rash noted between mons pubis and R inner thigh concerning for candidal infection. No discrete lesions, no drainage.  Cardiovascular: Normal heart rate noted  Respiratory: Normal respiratory effort, no problems with respiration noted  Abdomen: Soft, gravid, appropriate for gestational age.  Pain/Pressure: Present     Pelvic: Cervical exam deferred. Rash as described as above.  Extremities: Normal range of motion.  Edema: None  Mental Status:  Normal  mood and affect. Normal behavior. Normal judgment and thought content.   Assessment and Plan:  Pregnancy: G1P0000 at 5346w4d  1. Candida infection of flexural skin Likely yeast infection. Nystatin cream prescribed.  - nystatin cream (MYCOSTATIN); Apply 1 application topically 3 (three) times daily.  Dispense: 30 g; Refill: 3  2. Supervision of other normal pregnancy, antepartum Desires waterbirth, will take class on 02/02/18, sign consent and get supplies Will be scheduled to see CNM as next visit. Preterm labor symptoms and general obstetric precautions including but not limited to vaginal bleeding, contractions, leaking of fluid and fetal movement were reviewed in detail with the patient. Please refer to After Visit Summary for other counseling recommendations.  Return in about 2 weeks (around 01/25/2018) for OB Visit, Pelvic cultures (schedule with CNM).   Jaynie CollinsUgonna Lynae Pederson, MD

## 2018-01-11 NOTE — Patient Instructions (Signed)
Return to clinic for any scheduled appointments or obstetric concerns, or go to MAU for evaluation  Thinking About Waterbirth???  You must attend a Doren Custard class at [facility]  3rd Wednesday of every month from 7-9pm  Free  [employer] by calling [phone] or online at [email or web address]  Bring [place] the certificate from the class  Waterbirth supplies needed for [facility] patients:  Our practice has a Heritage manager in a Box tub at the hospital that you can borrow  You will need to purchase an accessory kit that has all needed supplies through [facility] [phone]) or online $175.00  Or you can purchase the supplies separately: o Single-use disposable tub liner for Birth Pool in a Box (REGULAR size) o New garden hose labeled "lead-free", "suitable for drinking water", o Electric drain pump to remove water (We recommend 792 gallon per hour or greater pump.)  o  "non-toxic" OR "water potable" o Garden hose to remove the dirty water o Fish net o Bathing suit top (optional) o Long-handled mirror (optional)  [employer] sells tubs for ~ $120 if you would rather purchase your own tub.  They also sell accessories, liners.    Www.waterbirthsolutions.com for tub purchases and supplies  The Labor Ladies (www.thelaborladies.com) $275 for tub rental/set-up & take down/kit   [employer] information regarding doulas (labor support) who provide pool rentals:  IdentityList.se.htm   The Labor Ladies (www.thelaborladies.com)  IdentityList.se.htm   Things that would prevent you from having a waterbirth:  Premature, <37wks  Previous cesarean birth  Presence of thick meconium-stained fluid  Multiple gestation (Twins, triplets, etc.)  Uncontrolled diabetes or gestational diabetes requiring medication  Hypertension  Heavy vaginal bleeding  Non-reassuring fetal heart rate  Active  infection (MRSA, etc.)  If your labor has to be induced and induction method requires continuous monitoring of the baby's heart rate  Other risks/issues identified by your obstetrical provider

## 2018-01-26 ENCOUNTER — Other Ambulatory Visit (HOSPITAL_COMMUNITY)
Admission: RE | Admit: 2018-01-26 | Discharge: 2018-01-26 | Disposition: A | Discharge status: Home / Self Care | Payer: 59 | Source: Ambulatory Visit | Attending: Nurse Practitioner | Admitting: Nurse Practitioner

## 2018-01-26 ENCOUNTER — Ambulatory Visit (INDEPENDENT_AMBULATORY_CARE_PROVIDER_SITE_OTHER): Payer: 59 | Admitting: Nurse Practitioner

## 2018-01-26 ENCOUNTER — Encounter: Payer: Self-pay | Admitting: Nurse Practitioner

## 2018-01-26 VITALS — BP 133/76 | HR 99 | Wt 211.2 lb

## 2018-01-26 DIAGNOSIS — Z3480 Encounter for supervision of other normal pregnancy, unspecified trimester: Secondary | ICD-10-CM | POA: Insufficient documentation

## 2018-01-26 LAB — OB RESULTS CONSOLE GBS: STREP GROUP B AG: NEGATIVE

## 2018-01-26 NOTE — Progress Notes (Signed)
    Subjective:  Janet Rich is a 22 y.o. G1P0000 at 1021w5d being seen today for ongoing prenatal care.  She is currently monitored for the following issues for this low-risk pregnancy and has Dysmenorrhea; History of migraine headaches; Anxiety and depression; Increased BMI (body mass index); Family history of endometriosis in first degree relative; Morning sickness; Pelvic pressure in pregnancy, antepartum, second trimester; Pregnancy with abdominal cramping of lower quadrant, antepartum; Supervision of other normal pregnancy, antepartum; and Preterm labor on their problem list.  Patient reports occasional contraction.  Contractions: Not present. Vag. Bleeding: None.  Movement: Present. Denies leaking of fluid. Has stopped the procardia.  The following portions of the patient's history were reviewed and updated as appropriate: allergies, current medications, past family history, past medical history, past social history, past surgical history and problem list. Problem list updated.  Objective:   Vitals:   01/26/18 1314  BP: 133/76  Pulse: 99  Weight: 211 lb 3.2 oz (95.8 kg)    Fetal Status: Fetal Heart Rate (bpm): 143 Fundal Height: 33 cm Movement: Present     General:  Alert, oriented and cooperative. Patient is in no acute distress.  Skin: Skin is warm and dry. No rash noted.   Cardiovascular: Normal heart rate noted  Respiratory: Normal respiratory effort, no problems with respiration noted  Abdomen: Soft, gravid, appropriate for gestational age. Pain/Pressure: Present     Pelvic:  Cervical exam performed Dilation: Closed   Station: -3  Extremities: Normal range of motion.  Edema: None  Mental Status: Normal mood and affect. Normal behavior. Normal judgment and thought content.   Urinalysis:    NA  Assessment and Plan:  Pregnancy: G1P0000 at 1921w5d - last ultrasound was 3 weeks ago  1. Supervision of other normal pregnancy, antepartum Watch fundal height - measuring S<D (had  ultrasound 3 weeks ago).  If continues to measure size less than dates, may consider another ultrasound  - GC/Chlamydia probe amp ([place] - Culture, beta strep (group b only)  Term labor symptoms and general obstetric precautions including but not limited to vaginal bleeding, contractions, leaking of fluid and fetal movement were reviewed in detail with the patient. Please refer to After Visit Summary for other counseling recommendations.  Return in about 1 week (around 02/02/2018).  Nolene BernheimERRI Arriyana Rodell, RN, MSN, NP-BC Nurse Practitioner, [facility], [facility] 01/26/2018 1:40 PM

## 2018-01-26 NOTE — Patient Instructions (Addendum)
Try Tinactin for rash in groin,  Braxton Hicks Contractions Contractions of the uterus can occur throughout pregnancy, but they are not always a sign that you are in labor. You may have practice contractions called Braxton Hicks contractions. These false labor contractions are sometimes confused with true labor. What are Deberah Pelton contractions? Braxton Hicks contractions are tightening movements that occur in the muscles of the uterus before labor. Unlike true labor contractions, these contractions do not result in opening (dilation) and thinning of the cervix. Toward the end of pregnancy (32-34 weeks), Braxton Hicks contractions can happen more often and may become stronger. These contractions are sometimes difficult to tell apart from true labor because they can be very uncomfortable. You should not feel embarrassed if you go to the hospital with false labor. Sometimes, the only way to tell if you are in true labor is for your health care provider to look for changes in the cervix. The health care provider will do a physical exam and may monitor your contractions. If you are not in true labor, the exam should show that your cervix is not dilating and your water has not broken. If there are other health problems associated with your pregnancy, it is completely safe for you to be sent home with false labor. You may continue to have Braxton Hicks contractions until you go into true labor. How to tell the difference between true labor and false labor True labor  Contractions last 30-70 seconds.  Contractions become very regular.  Discomfort is usually felt in the top of the uterus, and it spreads to the lower abdomen and low back.  Contractions do not go away with walking.  Contractions usually become more intense and increase in frequency.  The cervix dilates and gets thinner. False labor  Contractions are usually shorter and not as strong as true labor contractions.  Contractions are  usually irregular.  Contractions are often felt in the front of the lower abdomen and in the groin.  Contractions may go away when you walk around or change positions while lying down.  Contractions get weaker and are shorter-lasting as time goes on.  The cervix usually does not dilate or become thin. Follow these instructions at home:  Take over-the-counter and prescription medicines only as told by your health care provider.  Keep up with your usual exercises and follow other instructions from your health care provider.  Eat and drink lightly if you think you are going into labor.  If Braxton Hicks contractions are making you uncomfortable: ? Change your position from lying down or resting to walking, or change from walking to resting. ? Sit and rest in a tub of warm water. ? Drink enough fluid to keep your urine pale yellow. Dehydration may cause these contractions. ? Do slow and deep breathing several times an hour.  Keep all follow-up prenatal visits as told by your health care provider. This is important. Contact a health care provider if:  You have a fever.  You have continuous pain in your abdomen. Get help right away if:  Your contractions become stronger, more regular, and closer together.  You have fluid leaking or gushing from your vagina.  You pass blood-tinged mucus (bloody show).  You have bleeding from your vagina.  You have low back pain that you never had before.  You feel your baby's head pushing down and causing pelvic pressure.  Your baby is not moving inside you as much as it used to. Summary  Contractions  that occur before labor are called Braxton Hicks contractions, false labor, or practice contractions.  Braxton Hicks contractions are usually shorter, weaker, farther apart, and less regular than true labor contractions. True labor contractions usually become progressively stronger and regular and they become more frequent.  Manage discomfort  from [facility] contractions by changing position, resting in a warm bath, drinking plenty of water, or practicing deep breathing. This information is not intended to replace advice given to you by your health care provider. Make sure you discuss any questions you have with your health care provider. Document Released: 02/18/2017 Document Revised: 02/18/2017 Document Reviewed: 02/18/2017 Elsevier Interactive Patient Education  2018 [employer].

## 2018-01-27 LAB — GC/CHLAMYDIA PROBE AMP (~~LOC~~) NOT AT ARMC
CHLAMYDIA, DNA PROBE: NEGATIVE
NEISSERIA GONORRHEA: NEGATIVE

## 2018-01-30 LAB — CULTURE, BETA STREP (GROUP B ONLY): Strep Gp B Culture: NEGATIVE

## 2018-02-01 ENCOUNTER — Ambulatory Visit: Payer: 59

## 2018-02-01 DIAGNOSIS — Z3480 Encounter for supervision of other normal pregnancy, unspecified trimester: Secondary | ICD-10-CM

## 2018-02-01 NOTE — Progress Notes (Signed)
Patient walk into the office stating she has not been feeling the baby moved much today. FHR check at 145 baby kick while I was listening. I have advised mom to drink plenty of fluids especially cold water and try eating something to see if this will help baby moved move. Advised patient she can always go to MAU to be check out if she has a concern regarding movement of baby if we are not available. Patient voice understanding.

## 2018-02-02 ENCOUNTER — Encounter: Payer: Self-pay | Admitting: Family Medicine

## 2018-02-02 ENCOUNTER — Ambulatory Visit (INDEPENDENT_AMBULATORY_CARE_PROVIDER_SITE_OTHER): Payer: 59 | Admitting: Family Medicine

## 2018-02-02 VITALS — BP 113/75 | HR 92 | Wt 214.0 lb

## 2018-02-02 DIAGNOSIS — O368130 Decreased fetal movements, third trimester, not applicable or unspecified: Secondary | ICD-10-CM

## 2018-02-02 DIAGNOSIS — Z3480 Encounter for supervision of other normal pregnancy, unspecified trimester: Secondary | ICD-10-CM

## 2018-02-03 ENCOUNTER — Telehealth: Payer: Self-pay | Admitting: Radiology

## 2018-02-03 NOTE — Progress Notes (Signed)
I have reviewed the chart and agree with nursing staff's documentation of this patient's encounter.  If decreased FM continues, will need NST/BPP for further evaluation.  Jaynie CollinsUgonna Demiana Crumbley, MD 02/03/2018 10:02 AM

## 2018-02-03 NOTE — Telephone Encounter (Signed)
Patient called to inform us that she had some bleeding yesterday after her cervical check, states there were 3 small clots and then this morning some brown blood when she wiped. Explained this is normal after having cervix checked.

## 2018-02-08 ENCOUNTER — Ambulatory Visit (INDEPENDENT_AMBULATORY_CARE_PROVIDER_SITE_OTHER): Payer: 59 | Admitting: Family Medicine

## 2018-02-08 VITALS — BP 124/60 | HR 74 | Wt 212.0 lb

## 2018-02-08 DIAGNOSIS — Z3480 Encounter for supervision of other normal pregnancy, unspecified trimester: Secondary | ICD-10-CM

## 2018-02-08 DIAGNOSIS — Z3483 Encounter for supervision of other normal pregnancy, third trimester: Secondary | ICD-10-CM

## 2018-02-08 NOTE — Progress Notes (Signed)
   PRENATAL VISIT NOTE  Subjective:  Janet Rich is a 22 y.o. G1P0000 at 8145w4d being seen today for ongoing prenatal care.  She is currently monitored for the following issues for this low-risk pregnancy and has Dysmenorrhea; History of migraine headaches; Anxiety and depression; Increased BMI (body mass index); Family history of endometriosis in first degree relative; Morning sickness; Pelvic pressure in pregnancy, antepartum, second trimester; Pregnancy with abdominal cramping of lower quadrant, antepartum; Supervision of other normal pregnancy, antepartum; and Preterm labor on their problem list.  Patient reports contractions since Sunday.  Contractions: Irregular. Vag. Bleeding: None.  Movement: Present. Denies leaking of fluid.   The following portions of the patient's history were reviewed and updated as appropriate: allergies, current medications, past family history, past medical history, past social history, past surgical history and problem list. Problem list updated.  Objective:   Vitals:   02/08/18 0956  BP: 124/60  Pulse: 74  Weight: 212 lb (96.2 kg)    Fetal Status: Fetal Heart Rate (bpm): NST   Movement: Present  Presentation: Vertex  General:  Alert, oriented and cooperative. Patient is in no acute distress.  Skin: Skin is warm and dry. No rash noted.   Cardiovascular: Normal heart rate noted  Respiratory: Normal respiratory effort, no problems with respiration noted  Abdomen: Soft, gravid, appropriate for gestational age.  Pain/Pressure: Present     Pelvic: Cervical exam performed Dilation: 1.5 Effacement (%): 60 Station: -2  Extremities: Normal range of motion.  Edema: Trace  Mental Status: Normal mood and affect. Normal behavior. Normal judgment and thought content.  NST:  Baseline: 140 bpm, Variability: Good {> 6 bpm), Accelerations: Reactive, Decelerations: Absent and cotnractions q 10 with irritability   Assessment and Plan:  Pregnancy: G1P0000 at 7245w4d  1.  Supervision of other normal pregnancy, antepartum Lost mucous plug--not in active labor yet Reviewed labor precautions - Fetal nonstress test  Term labor symptoms and general obstetric precautions including but not limited to vaginal bleeding, contractions, leaking of fluid and fetal movement were reviewed in detail with the patient. Please refer to After Visit Summary for other counseling recommendations.  No follow-ups on file.  Future Appointments  Date Time Provider Department Center  02/16/2018  2:00 PM Federico FlakeNewton, Kimberly Niles, MD [facility]    Reva Boresanya S Nikaela Coyne, MD

## 2018-02-08 NOTE — Progress Notes (Signed)
   PRENATAL VISIT NOTE  Subjective:  Janet Rich is a 22 y.o. G1P0000 at 8325w3d being seen today for ongoing prenatal care.  She is currently monitored for the following issues for this low-risk pregnancy and has Dysmenorrhea; History of migraine headaches; Anxiety and depression; Increased BMI (body mass index); Family history of endometriosis in first degree relative; Morning sickness; Pelvic pressure in pregnancy, antepartum, second trimester; Pregnancy with abdominal cramping of lower quadrant, antepartum; Supervision of other normal pregnancy, antepartum; and Preterm labor on their problem list.  Patient reports no complaints.  Contractions: Irregular. Vag. Bleeding: None.  Movement: (!) Decreased. Denies leaking of fluid.   The following portions of the patient's history were reviewed and updated as appropriate: allergies, current medications, past family history, past medical history, past social history, past surgical history and problem list. Problem list updated.  Objective:   Vitals:   02/02/18 1441  BP: 113/75  Pulse: 92  Weight: 214 lb (97.1 kg)    Fetal Status: Fetal Heart Rate (bpm): NST   Movement: (!) Decreased     General:  Alert, oriented and cooperative. Patient is in no acute distress.  Skin: Skin is warm and dry. No rash noted.   Cardiovascular: Normal heart rate noted  Respiratory: Normal respiratory effort, no problems with respiration noted  Abdomen: Soft, gravid, appropriate for gestational age.  Pain/Pressure: Present     Pelvic: Cervical exam deferred        Extremities: Normal range of motion.  Edema: Trace  Mental Status: Normal mood and affect. Normal behavior. Normal judgment and thought content.   Assessment and Plan:  Pregnancy: G1P0000 at 3026w4d  1. Supervision of other normal pregnancy, antepartum UTD Reviewed labor s/x. understands when to go to LD Discussed need for kidney eval post delivery.  NST Reactive and reassuring today  Term labor  symptoms and general obstetric precautions including but not limited to vaginal bleeding, contractions, leaking of fluid and fetal movement were reviewed in detail with the patient. Please refer to After Visit Summary for other counseling recommendations.  No follow-ups on file.  Future Appointments  Date Time Provider Department Center  02/16/2018  2:00 PM Federico FlakeNewton, Dezi Schaner Niles, MD [facility]    Federico FlakeKimberly Niles Abrahan Fulmore, MD

## 2018-02-08 NOTE — Progress Notes (Signed)
Pt here to r/o labor. She states she has been having contractions q721m since yesterday

## 2018-02-09 ENCOUNTER — Encounter: Payer: 59 | Admitting: Student

## 2018-02-10 ENCOUNTER — Inpatient Hospital Stay (EMERGENCY_DEPARTMENT_HOSPITAL)
Admission: AD | Admit: 2018-02-10 | Discharge: 2018-02-10 | Disposition: A | Discharge status: Home / Self Care | Payer: 59 | Source: Ambulatory Visit | Attending: Obstetrics & Gynecology | Admitting: Obstetrics & Gynecology

## 2018-02-10 ENCOUNTER — Encounter (HOSPITAL_COMMUNITY): Payer: Self-pay

## 2018-02-10 DIAGNOSIS — O471 False labor at or after 37 completed weeks of gestation: Secondary | ICD-10-CM

## 2018-02-10 NOTE — MAU Note (Signed)
I have communicated with Steward DroneVeronica Rogers, CNM and reviewed vital signs:  Vitals:   02/10/18 1634  BP: 127/84  Pulse: 78  Resp: 18  Temp: 98.3 F (36.8 C)    Vaginal exam:  Dilation: 1.5 Effacement (%): 60 Station: -3 Presentation: Vertex Exam by:: Antony Odeaaroline Neil, CNM,   Also reviewed contraction pattern and that non-stress test is reactive.  It has been documented that patient is having irregular contractions and has no cervical change since being checked on Tuesday 02/08/18 not indicating active labor.  Patient denies any other complaints.  Based on this report provider has given order for discharge.  A discharge order and diagnosis entered by a provider.   Labor discharge instructions reviewed with patient.

## 2018-02-10 NOTE — MAU Note (Signed)
Pt reports she started having ctx that have been getting closer and stronger since this morning. C/O N/V. Good fetal movement felt. Repots some bloody show and mucus.

## 2018-02-12 ENCOUNTER — Encounter (HOSPITAL_COMMUNITY): Payer: Self-pay

## 2018-02-12 ENCOUNTER — Inpatient Hospital Stay (HOSPITAL_COMMUNITY)
Admission: AD | Admit: 2018-02-12 | Discharge: 2018-02-14 | DRG: 805 | Disposition: A | Discharge status: Home / Self Care | Payer: 59 | Source: Ambulatory Visit | Attending: Obstetrics and Gynecology | Admitting: Obstetrics and Gynecology

## 2018-02-12 ENCOUNTER — Inpatient Hospital Stay (HOSPITAL_COMMUNITY): Payer: 59 | Admitting: Anesthesiology

## 2018-02-12 DIAGNOSIS — O4200 Premature rupture of membranes, onset of labor within 24 hours of rupture, unspecified weeks of gestation: Secondary | ICD-10-CM | POA: Diagnosis present

## 2018-02-12 DIAGNOSIS — Z3A39 39 weeks gestation of pregnancy: Secondary | ICD-10-CM | POA: Diagnosis not present

## 2018-02-12 DIAGNOSIS — O4292 Full-term premature rupture of membranes, unspecified as to length of time between rupture and onset of labor: Principal | ICD-10-CM | POA: Diagnosis present

## 2018-02-12 DIAGNOSIS — F329 Major depressive disorder, single episode, unspecified: Secondary | ICD-10-CM | POA: Diagnosis present

## 2018-02-12 DIAGNOSIS — Z91040 Latex allergy status: Secondary | ICD-10-CM

## 2018-02-12 DIAGNOSIS — O9962 Diseases of the digestive system complicating childbirth: Secondary | ICD-10-CM | POA: Diagnosis present

## 2018-02-12 DIAGNOSIS — O99344 Other mental disorders complicating childbirth: Secondary | ICD-10-CM | POA: Diagnosis present

## 2018-02-12 DIAGNOSIS — O358XX0 Maternal care for other (suspected) fetal abnormality and damage, not applicable or unspecified: Secondary | ICD-10-CM | POA: Diagnosis present

## 2018-02-12 DIAGNOSIS — K219 Gastro-esophageal reflux disease without esophagitis: Secondary | ICD-10-CM | POA: Diagnosis present

## 2018-02-12 DIAGNOSIS — F419 Anxiety disorder, unspecified: Secondary | ICD-10-CM | POA: Diagnosis present

## 2018-02-12 DIAGNOSIS — O4593 Premature separation of placenta, unspecified, third trimester: Secondary | ICD-10-CM | POA: Diagnosis present

## 2018-02-12 DIAGNOSIS — Z87891 Personal history of nicotine dependence: Secondary | ICD-10-CM

## 2018-02-12 DIAGNOSIS — F32A Depression, unspecified: Secondary | ICD-10-CM | POA: Diagnosis present

## 2018-02-12 DIAGNOSIS — Z3480 Encounter for supervision of other normal pregnancy, unspecified trimester: Secondary | ICD-10-CM

## 2018-02-12 LAB — COMPREHENSIVE METABOLIC PANEL
ALT: 12 U/L — AB (ref 14–54)
AST: 16 U/L (ref 15–41)
Albumin: 2.9 g/dL — ABNORMAL LOW (ref 3.5–5.0)
Alkaline Phosphatase: 111 U/L (ref 38–126)
Anion gap: 11 (ref 5–15)
BUN: 6 mg/dL (ref 6–20)
CHLORIDE: 103 mmol/L (ref 101–111)
CO2: 19 mmol/L — AB (ref 22–32)
CREATININE: 0.5 mg/dL (ref 0.44–1.00)
Calcium: 9 mg/dL (ref 8.9–10.3)
GFR calc Af Amer: >60 mL/min (ref >60)
GFR calc non Af Amer: >60 mL/min (ref >60)
Glucose, Bld: 85 mg/dL (ref 65–99)
POTASSIUM: 4.1 mmol/L (ref 3.5–5.1)
Sodium: 133 mmol/L — ABNORMAL LOW (ref 135–145)
Total Bilirubin: 0.2 mg/dL — ABNORMAL LOW (ref 0.3–1.2)
Total Protein: 7.2 g/dL (ref 6.5–8.1)

## 2018-02-12 LAB — PROTEIN / CREATININE RATIO, URINE
CREATININE, URINE: 133 mg/dL
Protein Creatinine Ratio: 0.17 mg/mg{Cre} — ABNORMAL HIGH (ref 0.00–0.15)
Total Protein, Urine: 22 mg/dL

## 2018-02-12 LAB — CBC
HCT: 36.8 % (ref 36.0–46.0)
HEMATOCRIT: 37.1 % (ref 36.0–46.0)
HEMOGLOBIN: 12.2 g/dL (ref 12.0–15.0)
Hemoglobin: 12 g/dL (ref 12.0–15.0)
MCH: 25.5 pg — ABNORMAL LOW (ref 26.0–34.0)
MCH: 25.4 pg — ABNORMAL LOW (ref 26.0–34.0)
MCHC: 32.9 g/dL (ref 30.0–36.0)
MCHC: 32.6 g/dL (ref 30.0–36.0)
MCV: 77.6 fL — AB (ref 78.0–100.0)
MCV: 77.8 fL — AB (ref 78.0–100.0)
PLATELETS: 334 10*3/uL (ref 150–400)
Platelets: 360 10*3/uL (ref 150–400)
RBC: 4.78 MIL/uL (ref 3.87–5.11)
RBC: 4.73 MIL/uL (ref 3.87–5.11)
RDW: 16.6 % — ABNORMAL HIGH (ref 11.5–15.5)
RDW: 16.6 % — AB (ref 11.5–15.5)
WBC: 11.4 10*3/uL — ABNORMAL HIGH (ref 4.0–10.5)
WBC: 15.1 10*3/uL — AB (ref 4.0–10.5)

## 2018-02-12 LAB — RPR: RPR Ser Ql: NONREACTIVE

## 2018-02-12 LAB — POCT FERN TEST: POCT FERN TEST: POSITIVE

## 2018-02-12 LAB — TYPE AND SCREEN
ABO/RH(D): A POS
ANTIBODY SCREEN: NEGATIVE

## 2018-02-12 MED ORDER — EPHEDRINE 5 MG/ML INJ
10.0000 mg | INTRAVENOUS | Status: DC | PRN
  Filled 2018-02-12: qty 2

## 2018-02-12 MED ORDER — LIDOCAINE HCL (PF) 1 % IJ SOLN
30.0000 mL | INTRAMUSCULAR | Status: AC | PRN
  Administered 2018-02-12: 30 mL via SUBCUTANEOUS
  Filled 2018-02-12: qty 30

## 2018-02-12 MED ORDER — ONDANSETRON HCL 4 MG PO TABS: 4.0000 mg | ORAL_TABLET | ORAL | Status: DC | PRN

## 2018-02-12 MED ORDER — ONDANSETRON HCL 4 MG/2ML IJ SOLN
4.0000 mg | Freq: Four times a day (QID) | INTRAMUSCULAR | Status: DC | PRN
  Administered 2018-02-12 (×2): 4 mg via INTRAVENOUS
  Filled 2018-02-12 (×2): qty 2

## 2018-02-12 MED ORDER — BENZOCAINE-MENTHOL 20-0.5 % EX AERO
1.0000 "application " | INHALATION_SPRAY | CUTANEOUS | Status: DC | PRN
  Administered 2018-02-14: 1 via TOPICAL
  Filled 2018-02-12: qty 56

## 2018-02-12 MED ORDER — OXYCODONE-ACETAMINOPHEN 5-325 MG PO TABS: 2.0000 | ORAL_TABLET | ORAL | Status: DC | PRN

## 2018-02-12 MED ORDER — CEFAZOLIN SODIUM-DEXTROSE 2-4 GM/100ML-% IV SOLN
2.0000 g | Freq: Three times a day (TID) | INTRAVENOUS | Status: AC
  Administered 2018-02-13 (×2): 2 g via INTRAVENOUS
  Filled 2018-02-12 (×3): qty 100

## 2018-02-12 MED ORDER — SOD CITRATE-CITRIC ACID 500-334 MG/5ML PO SOLN
30.0000 mL | ORAL | Status: DC | PRN
  Filled 2018-02-12: qty 15

## 2018-02-12 MED ORDER — ONDANSETRON HCL 4 MG/2ML IJ SOLN: 4.0000 mg | INTRAMUSCULAR | Status: DC | PRN

## 2018-02-12 MED ORDER — SIMETHICONE 80 MG PO CHEW: 80.0000 mg | CHEWABLE_TABLET | ORAL | Status: DC | PRN

## 2018-02-12 MED ORDER — OXYTOCIN 40 UNITS IN LACTATED RINGERS INFUSION - SIMPLE MED: 1.0000 m[IU]/min | INTRAVENOUS | Status: DC

## 2018-02-12 MED ORDER — LACTATED RINGERS IV SOLN
INTRAVENOUS | Status: DC
  Administered 2018-02-12: 300 mL via INTRAUTERINE

## 2018-02-12 MED ORDER — ZOLPIDEM TARTRATE 5 MG PO TABS: 5.0000 mg | ORAL_TABLET | Freq: Every evening | ORAL | Status: DC | PRN

## 2018-02-12 MED ORDER — MEASLES, MUMPS & RUBELLA VAC ~~LOC~~ INJ
0.5000 mL | INJECTION | Freq: Once | SUBCUTANEOUS | Status: DC
  Filled 2018-02-12: qty 0.5

## 2018-02-12 MED ORDER — LIDOCAINE HCL (PF) 1 % IJ SOLN
INTRAMUSCULAR | Status: DC | PRN
  Administered 2018-02-12: 13 mL via EPIDURAL

## 2018-02-12 MED ORDER — FENTANYL CITRATE (PF) 100 MCG/2ML IJ SOLN
100.0000 ug | INTRAMUSCULAR | Status: DC | PRN
  Administered 2018-02-12: 100 ug via INTRAVENOUS
  Filled 2018-02-12: qty 2

## 2018-02-12 MED ORDER — PHENYLEPHRINE 40 MCG/ML (10ML) SYRINGE FOR IV PUSH (FOR BLOOD PRESSURE SUPPORT)
80.0000 ug | PREFILLED_SYRINGE | INTRAVENOUS | Status: DC | PRN
  Administered 2018-02-12: 80 ug via INTRAVENOUS
  Filled 2018-02-12: qty 5
  Filled 2018-02-12: qty 10

## 2018-02-12 MED ORDER — ACETAMINOPHEN 325 MG PO TABS
650.0000 mg | ORAL_TABLET | ORAL | Status: DC | PRN
  Administered 2018-02-13 – 2018-02-14 (×4): 650 mg via ORAL
  Filled 2018-02-12 (×4): qty 2

## 2018-02-12 MED ORDER — COCONUT OIL OIL: 1.0000 "application " | TOPICAL_OIL | Status: DC | PRN

## 2018-02-12 MED ORDER — LACTATED RINGERS IV SOLN
500.0000 mL | Freq: Once | INTRAVENOUS | Status: AC
  Administered 2018-02-12: 1000 mL via INTRAVENOUS

## 2018-02-12 MED ORDER — MAGNESIUM HYDROXIDE 400 MG/5ML PO SUSP: 30.0000 mL | ORAL | Status: DC | PRN

## 2018-02-12 MED ORDER — DIBUCAINE 1 % RE OINT: 1.0000 "application " | TOPICAL_OINTMENT | RECTAL | Status: DC | PRN

## 2018-02-12 MED ORDER — OXYTOCIN 40 UNITS IN LACTATED RINGERS INFUSION - SIMPLE MED
2.5000 [IU]/h | INTRAVENOUS | Status: DC
  Administered 2018-02-12: 2.5 [IU]/h via INTRAVENOUS
  Filled 2018-02-12: qty 1000

## 2018-02-12 MED ORDER — TETANUS-DIPHTH-ACELL PERTUSSIS 5-2.5-18.5 LF-MCG/0.5 IM SUSP: 0.5000 mL | Freq: Once | INTRAMUSCULAR | Status: DC

## 2018-02-12 MED ORDER — FERROUS SULFATE 325 (65 FE) MG PO TABS
325.0000 mg | ORAL_TABLET | Freq: Two times a day (BID) | ORAL | Status: DC
  Administered 2018-02-13 – 2018-02-14 (×2): 325 mg via ORAL
  Filled 2018-02-12 (×2): qty 1

## 2018-02-12 MED ORDER — PHENYLEPHRINE 40 MCG/ML (10ML) SYRINGE FOR IV PUSH (FOR BLOOD PRESSURE SUPPORT)
80.0000 ug | PREFILLED_SYRINGE | INTRAVENOUS | Status: DC | PRN
  Filled 2018-02-12: qty 5

## 2018-02-12 MED ORDER — DIPHENHYDRAMINE HCL 25 MG PO CAPS: 25.0000 mg | ORAL_CAPSULE | Freq: Four times a day (QID) | ORAL | Status: DC | PRN

## 2018-02-12 MED ORDER — LACTATED RINGERS IV SOLN
INTRAVENOUS | Status: DC
  Administered 2018-02-12 (×2): via INTRAVENOUS

## 2018-02-12 MED ORDER — OXYTOCIN BOLUS FROM INFUSION
500.0000 mL | Freq: Once | INTRAVENOUS | Status: AC
  Administered 2018-02-12: 500 mL via INTRAVENOUS

## 2018-02-12 MED ORDER — FENTANYL 2.5 MCG/ML BUPIVACAINE 1/10 % EPIDURAL INFUSION (WH - ANES)
14.0000 mL/h | INTRAMUSCULAR | Status: DC | PRN
  Administered 2018-02-12: 14 mL/h via EPIDURAL
  Filled 2018-02-12: qty 100

## 2018-02-12 MED ORDER — OXYCODONE-ACETAMINOPHEN 5-325 MG PO TABS: 1.0000 | ORAL_TABLET | ORAL | Status: DC | PRN

## 2018-02-12 MED ORDER — ACETAMINOPHEN 325 MG PO TABS: 650.0000 mg | ORAL_TABLET | ORAL | Status: DC | PRN

## 2018-02-12 MED ORDER — LACTATED RINGERS IV SOLN
500.0000 mL | INTRAVENOUS | Status: DC | PRN
  Administered 2018-02-12: 1000 mL via INTRAVENOUS
  Administered 2018-02-12: 450 mL via INTRAVENOUS

## 2018-02-12 MED ORDER — DIPHENHYDRAMINE HCL 50 MG/ML IJ SOLN: 12.5000 mg | INTRAMUSCULAR | Status: DC | PRN

## 2018-02-12 MED ORDER — IBUPROFEN 600 MG PO TABS
600.0000 mg | ORAL_TABLET | Freq: Four times a day (QID) | ORAL | Status: DC
  Administered 2018-02-13 – 2018-02-14 (×7): 600 mg via ORAL
  Filled 2018-02-12 (×7): qty 1

## 2018-02-12 MED ORDER — PRENATAL MULTIVITAMIN CH
1.0000 | ORAL_TABLET | Freq: Every day | ORAL | Status: DC
  Administered 2018-02-13 – 2018-02-14 (×2): 1 via ORAL
  Filled 2018-02-12 (×2): qty 1

## 2018-02-12 MED ORDER — TERBUTALINE SULFATE 1 MG/ML IJ SOLN
0.2500 mg | Freq: Once | INTRAMUSCULAR | Status: DC | PRN
  Filled 2018-02-12: qty 1

## 2018-02-12 MED ORDER — WITCH HAZEL-GLYCERIN EX PADS: 1.0000 "application " | MEDICATED_PAD | CUTANEOUS | Status: DC | PRN

## 2018-02-12 MED ORDER — CEFAZOLIN SODIUM-DEXTROSE 2-4 GM/100ML-% IV SOLN
2.0000 g | Freq: Once | INTRAVENOUS | Status: AC
  Administered 2018-02-12: 2 g via INTRAVENOUS

## 2018-02-12 NOTE — Anesthesia Procedure Notes (Signed)
Epidural Patient location during procedure: OB Start time: 02/12/2018 3:34 PM End time: 02/12/2018 3:48 PM  Staffing Anesthesiologist: Lowella Curb, MD Performed: anesthesiologist   Preanesthetic Checklist Completed: patient identified, site marked, surgical consent, pre-op evaluation, timeout performed, IV checked, risks and benefits discussed and monitors and equipment checked  Epidural Patient position: sitting Prep: ChloraPrep Patient monitoring: heart rate, cardiac monitor, continuous pulse ox and blood pressure Approach: midline Location: L2-L3 Injection technique: LOR saline  Needle:  Needle type: Tuohy  Needle gauge: 17 G Needle length: 9 cm Needle insertion depth: 5 cm Catheter type: closed end flexible Catheter size: 20 Guage Catheter at skin depth: 9 cm Test dose: negative  Assessment Events: blood not aspirated, injection not painful, no injection resistance, negative IV test and no paresthesia  Additional Notes Reason for block:procedure for pain

## 2018-02-12 NOTE — Progress Notes (Signed)
[name], female   DOB: [birth year], 22 y.o.   MRN: [id number] Janet Rich is a 22 y.o. G1P0000 at [redacted]w[redacted]d.  Subjective: Very tired, not coping well w/ hydrotherapy or nitrous. Declines Narcotics due to Hx severe N/V w/ them n the past. Requesting epidural and pitocin augmentation PRN.   Objective: BP 134/71 (BP Location: Left Arm)   Pulse 85   Temp 98 F (36.7 C) (Oral)   Resp 18   Ht  (1.549 m)   Wt 216 lb (98 kg)   LMP 05/10/2017   SpO2 98%   BMI 40.81 kg/m    FHT:  FHR: 130 by doppler. UC:   Q 4-5 by palpation minutes, moderate Dilation: 4.5 Effacement (%): 90 Cervical Position: Posterior Station: 0 Presentation: Vertex Exam by:: Colin Mulders RN  Labs: Results for orders placed or performed during the hospital encounter of 02/12/18 (from the past 24 hour(s))  Fern Test     Status: Abnormal   Collection Time: 02/12/18  3:58 AM  Result Value Ref Range   POCT Fern Test Positive = ruptured amniotic membanes   CBC     Status: Abnormal   Collection Time: 02/12/18  4:05 AM  Result Value Ref Range   WBC 11.4 (H) 4.0 - 10.5 K/uL   RBC 4.78 3.87 - 5.11 MIL/uL   Hemoglobin 12.2 12.0 - 15.0 g/dL   HCT 40.9 81.1 - 91.4 %   MCV 77.6 (L) 78.0 - 100.0 fL   MCH 25.5 (L) 26.0 - 34.0 pg   MCHC 32.9 30.0 - 36.0 g/dL   RDW 78.2 (H) 95.6 - 21.3 %   Platelets 360 150 - 400 K/uL  Type and screen [facility]     Status: None   Collection Time: 02/12/18  4:05 AM  Result Value Ref Range   ABO/RH(D) A POS    Antibody Screen NEG    Sample Expiration      02/15/2018 Performed at [facility], [address] [place], [place] [address]   CBC     Status: Abnormal   Collection Time: 02/12/18  9:44 AM  Result Value Ref Range   WBC 15.1 (H) 4.0 - 10.5 K/uL   RBC 4.73 3.87 - 5.11 MIL/uL   Hemoglobin 12.0 12.0 - 15.0 g/dL   HCT 84.6 96.2 - 95.2 %   MCV 77.8 (L) 78.0 - 100.0 fL   MCH 25.4 (L) 26.0 - 34.0 pg   MCHC 32.6 30.0 - 36.0 g/dL   RDW  84.1 (H) 32.4 - 15.5 %   Platelets 334 150 - 400 K/uL  Comprehensive metabolic panel     Status: Abnormal   Collection Time: 02/12/18  9:44 AM  Result Value Ref Range   Sodium 133 (L) 135 - 145 mmol/L   Potassium 4.1 3.5 - 5.1 mmol/L   Chloride 103 101 - 111 mmol/L   CO2 19 (L) 22 - 32 mmol/L   Glucose, Bld 85 65 - 99 mg/dL   BUN 6 6 - 20 mg/dL   Creatinine, Ser 4.01 0.44 - 1.00 mg/dL   Calcium 9.0 8.9 - 02.7 mg/dL   Total Protein 7.2 6.5 - 8.1 g/dL   Albumin 2.9 (L) 3.5 - 5.0 g/dL   AST 16 15 - 41 U/L   ALT 12 (L) 14 - 54 U/L   Alkaline Phosphatase 111 38 - 126 U/L   Total Bilirubin 0.2 (L) 0.3 - 1.2 mg/dL   GFR calc non Af Amer >  60 >60 mL/min   GFR calc Af Amer >60 >60 mL/min   Anion gap 11 5 - 15  Protein / creatinine ratio, urine     Status: Abnormal   Collection Time: 02/12/18 10:26 AM  Result Value Ref Range   Creatinine, Urine 133.00 mg/dL   Total Protein, Urine 22 mg/dL   Protein Creatinine Ratio 0.17 (H) 0.00 - 0.15 mg/mg[Cre]    Assessment / Plan: [redacted]w[redacted]d week IUP Labor: Early w/ inadequate contraction pattern Fetal Wellbeing:  Category I Pain Control:  Nitrous. May have epidural Anticipated MOD:  SVD GHNT w/out evidence of Pre-E  Katrinka Blazing, [place] 02/12/2018 3:14 PM

## 2018-02-12 NOTE — MAU Note (Signed)
CTX 4-5 mins apart.  No LOF.  Reports having some bloody mucousy discharge.  Endorses good FM.  GBS negative

## 2018-02-12 NOTE — Progress Notes (Signed)
[name], female   DOB: 01/03/1996, 22 y.o.   MRN: [id number] Dr. Earlene Plater able to reduce ant lip. Will attempt pushing. Explained to pt and family that if baby does not tolerate pushing or pt is unable push adequately she will need C/S.   FHR 130's, mod variability, 15x15 accels, pos scalp stim, 7 minute prolonged decel, recovered.  UC's Q 2-5, strong

## 2018-02-12 NOTE — H&P (Signed)
LABOR AND DELIVERY ADMISSION HISTORY AND PHYSICAL NOTE  Janet Rich is a 22 y.o. female G1P0000 with IUP at [redacted]w[redacted]d by ovulation (patient was on Clomid) presenting for contractions with subsequent spontaneous rupture of membranes.  The patient initially presented to the MAU for evaluation of contractions. FHT was Cat I and the patient was going to be discharged home when she spontaneously ruptured her membranes.    She reports positive fetal movement. She denies leakage of fluid or vaginal bleeding.  Prenatal History/Complications: Migraine headaches Anxiety and depression PCOS Pregnancy conceived with Clomid Preterm labor requiring procardia temporarily during early 3rd trimester Elevated early 1hr GTT. Followed blood glucose at home 3rd trimester and reports normal readings  Prenatal Care: [facility]  Sonogram last completed 01/04/18:  Cephalic AFI 20.5 Anterior placenta, above cervical os EFW 2.1kg (42%) Anatomy notable for bilateral pyelectasis in kidneys- Bilateral UTD A1; renal pelves measured 8 mm on right and 9mm on left  Past Medical History: Past Medical History:  Diagnosis Date  . Anemia    with periods  . Ankle fracture    right  . Anxiety   . Asthma   . Back pain   . Depression   . Endometriosis   . GERD (gastroesophageal reflux disease)   . Headache   . Ovarian cyst   . PCOS (polycystic ovarian syndrome)   . Pneumonia    age 31  . PONV (postoperative nausea and vomiting)    nausea after wisdom teeth extraction  . Vaginal Pap smear, abnormal    bx ok    Past Surgical History: Past Surgical History:  Procedure Laterality Date  . ROBOTIC ASSISTED DIAGNOSTIC LAPAROSCOPY N/A 02/18/2017   Procedure: ROBOTIC ASSISTED DIAGNOSTIC LAPAROSCOPY,EXCISION AND ABLATION OF ENDOMETRIOSIS,LYSIS OF ADHESIONS;  Surgeon: Olivia Mackie, MD;  Location: [facility];  Service: Gynecology;  Laterality: N/A;  . WISDOM TOOTH EXTRACTION  2013    Obstetrical History: OB History     Gravida  1   Para  0   Term  0   Preterm  0   AB  0   Living  0     SAB  0   TAB  0   Ectopic  0   Multiple  0   Live Births  0           Social History: Social History   Socioeconomic History  . Marital status: Married    Spouse name: Not on file  . Number of children: Not on file  . Years of education: Not on file  . Highest education level: Not on file  Occupational History  . Not on file  Social Needs  . Financial resource strain: Not on file  . Food insecurity:    Worry: Not on file    Inability: Not on file  . Transportation needs:    Medical: Not on file    Non-medical: Not on file  Tobacco Use  . Smoking status: Former Smoker    Types: Cigarettes    Last attempt to quit: 12/20/2009    Years since quitting: 8.1  . Smokeless tobacco: Never Used  . Tobacco comment: age [age], quit  Substance and Sexual Activity  . Alcohol use: No    Frequency: Never    Comment: occassionally  . Drug use: No  . Sexual activity: Not Currently    Birth control/protection: None    Comment: last sex mid Jan 2019  Lifestyle  . Physical activity:    Days per week: Not  on file    Minutes per session: Not on file  . Stress: Not on file  Relationships  . Social connections:    Talks on phone: Not on file    Gets together: Not on file    Attends religious service: Not on file    Active member of club or organization: Not on file    Attends meetings of clubs or organizations: Not on file    Relationship status: Not on file  Other Topics Concern  . Not on file  Social History Narrative  . Not on file    Family History: Family History  Problem Relation Age of Onset  . Endometriosis Mother   . Diabetes Mother   . Depression Mother   . Breast cancer Paternal Grandmother   . Cancer Paternal Grandmother   . Depression Paternal Grandmother   . Schizophrenia Father   . Depression Father   . Heart disease Neg Hx   . Colon cancer Neg Hx   . Ovarian cancer Neg  Hx     Allergies: Allergies  Allergen Reactions  . Other     Must have anti-emetic prior to being given any narcotics   . Latex Rash    Medications Prior to Admission  Medication Sig Dispense Refill Last Dose  . acetaminophen (TYLENOL) 500 MG tablet Take 500 mg by mouth every 6 (six) hours as needed for headache.   02/09/2018 at Unknown time  . albuterol (PROVENTIL HFA;VENTOLIN HFA) 108 (90 Base) MCG/ACT inhaler Inhale 2 puffs into the lungs every 4 (four) hours as needed for wheezing or shortness of breath. 1 Inhaler 1 Rescue  . nystatin cream (MYCOSTATIN) Apply 1 application topically 3 (three) times daily. 30 g 3 02/09/2018 at Unknown time  . Pediatric Multiple Vit-C-FA (FLINSTONES GUMMIES OMEGA-3 DHA PO) Take 1 tablet by mouth daily.   Past Week at Unknown time  . ranitidine (ZANTAC) 150 MG tablet Take 150 mg by mouth 2 (two) times daily.   02/09/2018 at Unknown time     Review of Systems   All systems reviewed and negative except as stated in HPI  Blood pressure 128/88, pulse 87, temperature (!) 97.5 F (36.4 C), resp. rate 19, height  (1.549 m), weight 98 kg (216 lb), last menstrual period 05/10/2017. General appearance: alert, cooperative and no distress Lungs: clear to auscultation bilaterally Heart: regular rate and rhythm Abdomen: soft, non-tender; bowel sounds normal Extremities: No calf swelling or tenderness Presentation: cephalic Fetal monitoring: 135, +accels, -decels, mod variability Uterine activity: irregular pattern Dilation: 1.5 Effacement (%): 50, 60 Station: -3 Exam by:: Camelia Eng, RN   Prenatal labs: ABO, Rh: --/--/A POS (08/30 2036) Antibody: Negative (08/30 0000) Rubella: Immune (08/30 0000) RPR: Nonreactive (08/30 0000)  HBsAg: Negative (08/30 0000)  HIV: Non-reactive (08/30 0000)  GBS:   1 hr Glucola: 132; 2hr GTT ordered, but does not appear to have been completed. Pt followed blood sugar at home and reports normal readings.   Genetic screening:  Quad screening negative Anatomy US: Bilateral renal pyelectasis  Prenatal Transfer Tool  Maternal Diabetes: No 1hr GTT elevated to 132, and 2hr confirmatory ordered, but does not appear to have actually been completed. Mom followed blood glucose at home 3rd trimester and normal.  Genetic Screening: Normal Maternal Ultrasounds/Referrals: Abnormal:  Findings:   Fetal Kidney Anomalies Fetal Ultrasounds or other Referrals:  None Maternal Substance Abuse:  No Significant Maternal Medications:  None Significant Maternal Lab Results: Lab values include: Group B Strep negative  No results found for this or any previous visit (from the past 24 hour(s)).  Patient Active Problem List   Diagnosis Date Noted  . Preterm labor 12/31/2017  . Supervision of other normal pregnancy, antepartum 12/29/2017  . Pregnancy with abdominal cramping of lower quadrant, antepartum 12/20/2017  . Pelvic pressure in pregnancy, antepartum, second trimester 09/15/2017  . Morning sickness 07/13/2017  . Dysmenorrhea 05/01/2015  . History of migraine headaches 05/01/2015  . Anxiety and depression 05/01/2015  . Increased BMI (body mass index) 05/01/2015  . Family history of endometriosis in first degree relative 05/01/2015    Assessment: Javonna Balli is a 22 y.o. G1P0000 at [redacted]w[redacted]d here for SROM around 0345.   #Labor:Patient desires a Systems developer. Given she is a G0 and cervical exam consistent with early labor, will continue expectant management at this time. Nurse midwife to assist with water birth. Appreciate assistance.   #Pain: No epidural #FWB: Cat I; reassuring #ID:  GBS negative; no antibiotics indicated #MOF: Breast #MOC:Undecided; will follow-up with GYN given history of endometriosis and PCOS #Circ:  Yes  Lise Auer, MD PGY-3 02/12/2018, 3:57 AM

## 2018-02-12 NOTE — Progress Notes (Signed)
[name], female   DOB: [birth year], 24 y.o.   MRN: [id number] Janet Rich is a 22 y.o. G1P0000 at [redacted]w[redacted]d.  Subjective: More uncomfortable w/ UC's. Coping adequately, but wants to get in tub.  Denies HA, vision changes or epigastric pain.  Objective: BP 130/72 (BP Location: Left Arm)   Pulse 82   Temp 98.2 F (36.8 C) (Oral)   Resp 18   Ht  (1.549 m)   Wt 216 lb (98 kg)   LMP 05/10/2017   BMI 40.81 kg/m    FHT:  FHR: 130 bpm, variability: mod,  accelerations:  15x15,  decelerations:  none UC:   Q 3 minutes, moderate Dilation: 2 Effacement (%): 70 Cervical Position: Posterior Station: -3 Presentation: Vertex Exam by:: Janet Rich CNM  Labs: Results for orders placed or performed during the hospital encounter of 02/12/18 (from the past 24 hour(s))  Fern Test     Status: Abnormal   Collection Time: 02/12/18  3:58 AM  Result Value Ref Range   POCT Fern Test Positive = ruptured amniotic membanes   CBC     Status: Abnormal   Collection Time: 02/12/18  4:05 AM  Result Value Ref Range   WBC 11.4 (H) 4.0 - 10.5 K/uL   RBC 4.78 3.87 - 5.11 MIL/uL   Hemoglobin 12.2 12.0 - 15.0 g/dL   HCT 82.9 56.2 - 13.0 %   MCV 77.6 (L) 78.0 - 100.0 fL   MCH 25.5 (L) 26.0 - 34.0 pg   MCHC 32.9 30.0 - 36.0 g/dL   RDW 86.5 (H) 78.4 - 69.6 %   Platelets 360 150 - 400 K/uL  Type and screen [facility]     Status: None   Collection Time: 02/12/18  4:05 AM  Result Value Ref Range   ABO/RH(D) A POS    Antibody Screen NEG    Sample Expiration      02/15/2018 Performed at [facility], [address] [place], [place] [address]   CBC     Status: Abnormal   Collection Time: 02/12/18  9:44 AM  Result Value Ref Range   WBC 15.1 (H) 4.0 - 10.5 K/uL   RBC 4.73 3.87 - 5.11 MIL/uL   Hemoglobin 12.0 12.0 - 15.0 g/dL   HCT 41.3 24.4 - 01.0 %   MCV 77.8 (L) 78.0 - 100.0 fL   MCH 25.4 (L) 26.0 - 34.0 pg   MCHC 32.6 30.0 - 36.0 g/dL   RDW 27.2 (H) 53.6 - 64.4  %   Platelets 334 150 - 400 K/uL    Assessment / Plan: [redacted]w[redacted]d week IUP IUP Labor: Early Fetal Wellbeing:  Category I Pain Control:  Comfort measures. May get in tub Anticipated MOD:  SVD Mildly elevated BPs w/out pre-E Sx. Will check labs.  Waterbirth consent signed.   Janet Rich, [place], CNM 02/12/2018 1Allexis Bordenave0:04 AM

## 2018-02-12 NOTE — Anesthesia Preprocedure Evaluation (Signed)
Anesthesia Evaluation  Patient identified by MRN, date of birth, ID band Patient awake    Reviewed: Allergy & Precautions, H&P , Patient's Chart, lab work & pertinent test results, reviewed documented beta blocker date and time   Airway Mallampati: II  TM Distance: >3 FB Neck ROM: full    Dental no notable dental hx.    Pulmonary former smoker,    Pulmonary exam normal breath sounds clear to auscultation       Cardiovascular  Rhythm:regular Rate:Normal     Neuro/Psych    GI/Hepatic   Endo/Other    Renal/GU      Musculoskeletal   Abdominal (+) + obese,   Peds  Hematology   Anesthesia Other Findings   Reproductive/Obstetrics (+) Pregnancy                             Anesthesia Physical  Anesthesia Plan  ASA: II  Anesthesia Plan: Epidural   Post-op Pain Management:    Induction:   PONV Risk Score and Plan:   Airway Management Planned:   Additional Equipment:   Intra-op Plan:   Post-operative Plan:   Informed Consent: I have reviewed the patients History and Physical, chart, labs and discussed the procedure including the risks, benefits and alternatives for the proposed anesthesia with the patient or authorized representative who has indicated his/her understanding and acceptance.   Dental Advisory Given  Plan Discussed with: CRNA and Surgeon  Anesthesia Plan Comments: (  )        Anesthesia Quick Evaluation

## 2018-02-12 NOTE — Progress Notes (Signed)
[name], female   DOB: 09/23/1996, 22 y.o.   MRN: [id number] Called to [facility] for decel to 50's. Dr. Earlene Plater reviewing tracing. To BS STAT.

## 2018-02-12 NOTE — Anesthesia Pain Management Evaluation Note (Signed)
  CRNA Pain Management Visit Note  Patient: [name], 22 y.o., female  "Hello I am a member of the anesthesia team at [facility]. We have an anesthesia team available at all times to provide care throughout the hospital, including epidural management and anesthesia for C-section. I don't know your plan for the delivery whether it a natural birth, water birth, IV sedation, nitrous supplementation, doula or epidural, but we want to meet your pain goals."   1.Was your pain managed to your expectations on prior hospitalizations?   No   2.What is your expectation for pain management during this hospitalization?     Epidural  3.How can we help you reach that goal? Epidural in place at time of visit  Record the patient's initial score and the patient's pain goal.   Pain: 1  Pain Goal: 4 [facility] wants you to be able to say your pain was always managed very well.  Rica Records 02/12/2018

## 2018-02-12 NOTE — Progress Notes (Signed)
[name], female   DOB: [birth year], 22 y.o.   MRN: [id number]   Late Entry:  Janet Rich is a 22 y.o. G1P0000 at [redacted]w[redacted]d.  Subjective: Comfortable w/ epidural  Objective: BP (!) 104/53   Pulse 84   Temp 98 F (36.7 C) (Oral)   Resp 18   Ht  (1.549 m)   Wt 216 lb (98 kg)   LMP 05/10/2017   SpO2 97%   BMI 40.81 kg/m    FHT:  FHR: 150 bpm->baseline change to 120, variability: mod,  accelerations:  15x15,  decelerations:  1-2 minute decels to 90's UC:   Q 5-7 minutes, strong Dilation: 6 Effacement (%): 100 Station: 0 Presentation: Vertex Exam by:: Ivonne Andrew, CNM  Labs: NA  Assessment / Plan: [redacted]w[redacted]d week IUP Labor: Active, inadequate contractions Fetal Wellbeing:  Category II. Some prolonged decels, but reassuring in between  Pain Control:  Epidural Anticipated MOD:  SVD, but expressed concern to pt and family that if decels become persistent or prolonged we recommend C/S. Pt agreeable.  Dr. Earlene Plater updated, reviewed FHR tracing. Agrees w/ POC. Baseline appears to have changed, but has moderate variability, 10x10 accels and mild variables. Willl CTO closely.   Katrinka Blazing, [place], [place] 02/12/2018 8:46 PM

## 2018-02-12 NOTE — MAU Note (Signed)
I have communicated with Dr. Orvan Falconer and reviewed vital signs:  Vitals:   02/12/18 0220  BP: 128/88  Pulse: 87  Resp: 19  Temp: (!) 97.5 F (36.4 C)    Vaginal exam:  Dilation: 1.5 Effacement (%): 60, 50 Cervical Position: Posterior Station: -3 Presentation: Vertex Exam by:: Camelia Eng, RN,   Also reviewed contraction pattern and that non-stress test is reactive.  It has been documented that patient is contracting every 3.5-5.5 minutes with no cervical change over 1 hour, or since her exam on 02/10/2018, not indicating active labor.  Patient denies any other complaints.  Based on this report provider has given order for discharge.  A discharge order and diagnosis entered by a provider.   Labor discharge instructions reviewed with patient.

## 2018-02-12 NOTE — Consult Note (Signed)
Neonatology Note:   Attendance at Delivery:    I was asked by Dr. Earlene Plater to attend this vaginal at term due to bradycardia. The mother is a G1, GBS negative with good prenatal care. Delivery also complicated by intermitant decels and light meconium.  Pregnancy complicated by dilated UTD.  ROM 16 hours before delivery, fluid light meconium. Infant vigorous with good spontaneous cry and fair tone. Pink, acrocyanosis, improving tone. Needed only minimal bulb suctioning. Ap 7/8. Lungs clearing to ausc in DR. Strong crying and activity. Parents updated. To CN to care of Pediatrician. D/w NBN staff and provider.  Will need postnatal renal US >77h of age; monitor voids.  Dineen Kid Leary Roca, MD    Update:  Cord gas noted 6.95/102.  BW 2.24kg.  Will reassess infant now with reassessment of mental status and neuro dysfunction to determine if infant meets criteria for cooling protocol for HIE.  At the time of delivery, exam was reassuring and apgars were appropriate.    Leary Roca, MD 02/12/18 at 2018

## 2018-02-13 LAB — CBC
HEMATOCRIT: 32.2 % — AB (ref 36.0–46.0)
HEMOGLOBIN: 10.6 g/dL — AB (ref 12.0–15.0)
MCH: 25.4 pg — ABNORMAL LOW (ref 26.0–34.0)
MCHC: 32.9 g/dL (ref 30.0–36.0)
MCV: 77 fL — AB (ref 78.0–100.0)
Platelets: 250 10*3/uL (ref 150–400)
RBC: 4.18 MIL/uL (ref 3.87–5.11)
RDW: 16.7 % — AB (ref 11.5–15.5)
WBC: 18.8 10*3/uL — AB (ref 4.0–10.5)

## 2018-02-13 NOTE — Anesthesia Postprocedure Evaluation (Signed)
Anesthesia Post Note  Patient: Janet Rich  Procedure(s) Performed: AN AD HOC LABOR EPIDURAL     Patient location during evaluation: Mother Baby Anesthesia Type: Epidural Level of consciousness: awake and alert Pain management: pain level controlled Vital Signs Assessment: post-procedure vital signs reviewed and stable Respiratory status: spontaneous breathing, nonlabored ventilation and respiratory function stable Cardiovascular status: stable Postop Assessment: no headache, no backache, epidural receding and patient able to bend at knees Anesthetic complications: no    Last Vitals:  Vitals:   02/13/18 0007 02/13/18 0400  BP: 114/81 (!) 98/50  Pulse: 86 86  Resp: 18 18  Temp: 36.7 C 36.8 C  SpO2:      Last Pain:  Vitals:   02/13/18 0529  TempSrc:   PainSc: 0-No pain   Pain Goal: Patients Stated Pain Goal: 4 (02/12/18 1110)               Rica Records

## 2018-02-13 NOTE — Progress Notes (Signed)
CSW met with patient to discuss mental health concerns. Patient has diagnosis of anxiety and depression but is not on any psychotropic medications. Patient states that she is connected with a psychologist, Dr. Karr of [facility] that she feels comfortable speaking with regarding a prescription for medication if needs arise. CSW and patient discussed baby blues period versus postpartum depression. Patient stated that her grandmother passed away last night, but was not depressed by her loss. Patient reports having good spouse and family support. Patient's newborn is Janet Rich. CSW encouraged patient to reach out for assistance if needs arise, contact information provided. Patient expressed gratitude for CSW interaction.  Janet Rich, MSW, LCSW-A Clinical Social Worker  [facility] [phone]   

## 2018-02-13 NOTE — Lactation Note (Signed)
This note was copied from a baby's chart. Lactation Consultation Note  Patient Name: Boy Carolynne Schuchard [id number] Date: 02/13/2018 Reason for consult: Initial assessment;Primapara;1st time breastfeeding;NICU baby;Infant < 6lbs;Term  Visited briefly with P1 Mom of term SGA baby at 91 hrs old.  Baby 4 lbs 15 oz at birth.  Baby with low CBGs and tranferred to NICU for care. Mom set up with DEBP after baby's transfer, and assisted with pumping.  Mom doing hand's on pumping, one breast at a time.  Transported colostrum to NICU already.    Mom lying down planning to take a nap.  Encouraged her to call at any time for assistance.  Recommended pumping >8 times per 24 hrs.  Recommended breast massage, and hand expression prior to pumping, but to pump both breasts at a time using initiation setting on pump.  Talked about benefits to prolactin levels with double pumping.  Encouraged STS as much as she can, and pump after baby breastfeeds, due to small weight.    Mom has a DEBP at home.  Talked about need to pump until baby is gaining weight.  Mom informed of OP lactation support available.  Encouraged Mom to call prn for guidance.  Lactation brochure, and NICU brochure given to Mom.     Consult Status Consult Status: Follow-up Date: 02/14/18 Follow-up type: In-patient    Judee Clara 02/13/2018, 12:07 PM

## 2018-02-13 NOTE — Progress Notes (Signed)
POSTPARTUM PROGRESS NOTE  Post Partum Day 1  Subjective:  Janet Rich is a 22 y.o. G1P1001 s/p VAVD at [redacted]w[redacted]d.  She reports she is doing well. Baby in NICU for hypoglycemia this morning, but otherwise doing well. No acute events overnight. She denies any problems with ambulating, voiding or po intake. Denies nausea or vomiting.  Pain is well controlled.  Lochia is moderate.  Objective: Blood pressure (!) 98/50, pulse 86, temperature 98.3 F (36.8 C), temperature source Oral, resp. rate 18, height  (1.549 m), weight 216 lb (98 kg), last menstrual period 05/10/2017, SpO2 98 %, unknown if currently breastfeeding.  Physical Exam:  General: alert, cooperative and no distress Chest: no respiratory distress Heart:regular rate, distal pulses intact Abdomen: soft, nontender,  Uterine Fundus: firm, appropriately tender DVT Evaluation: No calf swelling or tenderness Extremities: no edema Skin: warm, dry  Recent Labs    02/12/18 0944 02/13/18 0549  HGB 12.0 10.6*  HCT 36.8 32.2*    Assessment/Plan: Jonique Kulig is a 22 y.o. G1P1001 s/p VAVD at [redacted]w[redacted]d   PPD#1 - Doing well  Routine postpartum care  Ancef x 24 hours d/t manual manipulation at delivery  Dispo: Plan for discharge tomorrow.   LOS: 1 day   Kandra Nicolas DegeleMD 02/13/2018, 8:49 AM

## 2018-02-13 NOTE — Plan of Care (Signed)
Progressing appropriately. Up to bathroom with assistance. Safety and admission information given.

## 2018-02-14 ENCOUNTER — Telehealth: Payer: Self-pay | Admitting: Radiology

## 2018-02-14 MED ORDER — POLYETHYLENE GLYCOL 3350 17 G PO PACK: 17.0000 g | PACK | Freq: Every day | ORAL | 0 refills | Status: DC | PRN

## 2018-02-14 MED ORDER — IBUPROFEN 600 MG PO TABS: 600.0000 mg | ORAL_TABLET | Freq: Four times a day (QID) | ORAL | 0 refills | Status: DC

## 2018-02-14 MED ORDER — POLYETHYLENE GLYCOL 3350 17 G PO PACK
17.0000 g | PACK | Freq: Every day | ORAL | Status: DC | PRN
  Administered 2018-02-14: 17 g via ORAL
  Filled 2018-02-14 (×2): qty 1

## 2018-02-14 NOTE — Telephone Encounter (Signed)
Left message confirming cancellation of the 02/16/18 appointment for Janet Rich. Scheduled postpartum on 03/11/18 @ 10:00 with Dr Vergie Living

## 2018-02-14 NOTE — Discharge Summary (Addendum)
OB Discharge Summary     Patient Name: Janet Rich DOB: Oct 02, 1996 MRN: [id number]  Date of admission: 02/12/2018 Delivering MD: Conan Bowens   Date of discharge: 02/14/2018  Admitting diagnosis: 39 wks ctx every 4 min and pressure Intrauterine pregnancy: [redacted]w[redacted]d     Secondary diagnosis:  Principal Problem:   Spontaneous vaginal delivery Active Problems:   Anxiety and depression   Supervision of other normal pregnancy, antepartum   PROM with onset of labor within 24 hours of rupture  Additional problems: none     Discharge diagnosis: Term Pregnancy Delivered                                                                                                Post partum procedures:none  Augmentation: none  Complications: Vacuum extraction due to late decel while at +2.  Placental Abruption- dark red blood delivered with fetus after vacuum assistance. Retained placenta manually extracted- cervix checked after delivery and placental membranes noted. All fragments located and brought out manually with bimanual exam. Received ancef for 24 hours after delivery. No fevers prior to discharge.  Hospital course:  Onset of Labor With Vaginal Delivery     22 y.o. yo G1P1001 at [redacted]w[redacted]d was admitted in Latent Labor on 02/12/2018. Patient had a labor course remarkable for FHR variables in active labor, eventually requiring vacuum assistance for delivery- see description of complications above.  Membrane Rupture Time/Date: 3:45 AM ,02/12/2018   Intrapartum Procedures: Episiotomy: None [1]                                         Lacerations:  2nd degree [3];Perineal [11];Vaginal [6]  Patient had a delivery of a Viable infant. 02/12/2018  Information for the patient's newborn:  Taja, [place] [[id number]]  Delivery Method: Vaginal, Vacuum (Extractor)(Filed from Delivery Summary)    Pateint had an uncomplicated postpartum course. Infant is in the NICU.  She is ambulating, tolerating a regular  diet, passing flatus, and urinating well. She has expressed some concern with constipation- will start prn Miralax. Patient is discharged home in stable condition on 02/14/18.   Physical exam  Vitals:   02/13/18 0007 02/13/18 0400 02/13/18 1922 02/14/18 0554  BP: 114/81 (!) 98/50 (!) 99/56 118/77  Pulse: 86 86 86 76  Resp: Temp: 98.1 F (36.7 C) 98.3 F (36.8 C) 99 F (37.2 C) 98.8 F (37.1 C)  TempSrc: Oral Oral Oral Oral  SpO2:   99%   Weight:      Height:       General: alert, cooperative and no distress Lochia: appropriate Uterine Fundus: firm Incision: N/A DVT Evaluation: No evidence of DVT seen on physical exam. Negative Homan's sign. Labs: Lab Results  Component Value Date   WBC 18.8 (H) 02/13/2018   HGB 10.6 (L) 02/13/2018   HCT 32.2 (L) 02/13/2018   MCV 77.0 (L) 02/13/2018   PLT 250 02/13/2018   CMP Latest Ref Rng & Units 02/12/2018  Glucose 65 - 99 mg/dL 85  BUN 6 - 20 mg/dL 6  Creatinine 1.61 - 0.96 mg/dL 0.45  Sodium 409 - 811 mmol/L 133(L)  Potassium 3.5 - 5.1 mmol/L 4.1  Chloride 101 - 111 mmol/L 103  CO2 22 - 32 mmol/L 19(L)  Calcium 8.9 - 10.3 mg/dL 9.0  Total Protein 6.5 - 8.1 g/dL 7.2  Total Bilirubin 0.3 - 1.2 mg/dL 9.1(Y)  Alkaline Phos 38 - 126 U/L 111  AST 15 - 41 U/L 16  ALT 14 - 54 U/L 12(L)    Discharge instruction: per After Visit Summary and "Baby and Me Booklet".  After visit meds:    Allergies as of 02/14/2018      Reactions   Other    Must have anti-emetic prior to being given any narcotics    Latex Rash      Medication List    STOP taking these medications   ranitidine 150 MG tablet Commonly known as:  ZANTAC     TAKE these medications   acetaminophen 500 MG tablet Commonly known as:  TYLENOL Take 500 mg by mouth every 6 (six) hours as needed for headache.   albuterol 108 (90 Base) MCG/ACT inhaler Commonly known as:  PROVENTIL HFA;VENTOLIN HFA Inhale 2 puffs into the lungs every 4 (four) hours as needed  for wheezing or shortness of breath.   FLINSTONES GUMMIES OMEGA-3 DHA PO Take 2 each by mouth daily.   ibuprofen 600 MG tablet Commonly known as:  ADVIL,MOTRIN Take 1 tablet (600 mg total) by mouth every 6 (six) hours.   nystatin cream Commonly known as:  MYCOSTATIN Apply 1 application topically 3 (three) times daily.   polyethylene glycol packet Commonly known as:  MIRALAX / GLYCOLAX Take 17 g by mouth daily as needed for mild constipation.       Diet: routine diet  Activity: Advance as tolerated. Pelvic rest for 6 weeks.   Outpatient follow up:4 weeks Follow up Appt: Future Appointments  Date Time Provider Department Center  02/16/2018  2:00 PM Federico Flake, MD [facility]   Follow up Visit:No follow-ups on file.  Postpartum contraception: undecided. Patient has history of PCOS, so would like to follow up with ob to determine best option for her.  Newborn Data: Live born female  Birth Weight: 4 lb 15 oz (2240 g) APGAR: 8, 9  Newborn Delivery   Birth date/time:  02/12/2018 19:12:00 Delivery type:  Vaginal, Vacuum (Extractor)     Baby Feeding: Breast Disposition:NICU   02/14/2018 Myrene Buddy, MD  CNM attestation I have seen and examined this patient and agree with above documentation in the resident's note.   Janet Rich is a 22 y.o. G1P1001 s/p VAVD.   Pain is well controlled.  Plan for birth control is unsure.  Method of Feeding: breast/pumping  PE:  BP 118/77 (BP Location: Right Arm)   Pulse 76   Temp 98.8 F (37.1 C) (Oral)   Resp 14   Ht  (1.549 m)   Wt 98 kg (216 lb)   LMP 05/10/2017   SpO2 99%   Breastfeeding? Unknown   BMI 40.81 kg/m  Fundus firm  Recent Labs    02/12/18 0944 02/13/18 0549  HGB 12.0 10.6*  HCT 36.8 32.2*     Plan: discharge today - postpartum care discussed - f/u clinic in 4 weeks for postpartum visit; connected with psychologist, so can contact prn anxiety/depression   Cam Hai, CNM 9:13 AM 02/14/2018

## 2018-02-14 NOTE — Lactation Note (Addendum)
This note was copied from a baby's chart. Lactation Consultation Note  Mother pumping when I arrived in the room. She pumped 35 ml. Mother reports that she only pumped two times today. Discussed supply and demand. Advised mother to pump at least 6-8 times in 24 hours for 15-20 mins. Mother advised to do good breast massage . Discussed treatment and prevention of engorgement. Mother reports that she has been placing infant skin to skin and has attempt to latch infant on with assistance from staff nurse. Mother reports that infant latched well before going to the NICU. Mother plans to rent a Symphony pump from the gift shop. Advised mother in storage, collection and transporting ebm. Mother is aware of available LC services and community support.   Patient Name: Janet Rich ZOXWR'U Date: 02/14/2018 Reason for consult: Follow-up assessment   Maternal Data    Feeding    LATCH Score                   Interventions Interventions: DEBP  Lactation Tools Discussed/Used     Consult Status Consult Status: Complete    Michel Bickers 02/14/2018, 2:14 PM

## 2018-02-16 ENCOUNTER — Encounter: Payer: 59 | Admitting: Family Medicine

## 2018-02-16 ENCOUNTER — Ambulatory Visit: Payer: Self-pay

## 2018-02-16 NOTE — Lactation Note (Signed)
This note was copied from a baby's chart. Lactation Consultation Note  Patient Name: Janet Rich ZOXWR'U Date: 02/16/2018 Reason for consult: Follow-up assessment;NICU baby;Infant < 6lbs;Term   Foklow up with mom in the NICU for feeding assessment at mom and RN request. Mom was holding infant and reports she planned to bottle feed infant for this feeding because she is engorged. Enc mom to try BF and she was agreeable.   Infant awake and alert and positioned to left breast in the football hold. Attempted to latch, infant was not able to sustain latch to the breast. Applied # 20 NS and latched infant to the left breast. Infant sleepy at the breast but did display some good suckling bursts with good swallows noted. Mom reports felt a tug with suckling and was pleased to see swallows. Mom reports " we really needed this".  Mom was pleased with infant feeding. Softening to lower breast noted after feeding. Milk was in the NS and swallows good were noted. Nipple was pulled up partially into the NS with feeding. Mom was shown how to apply NS, enc her to use with feedings and to BF infant when she is available.   Reviewed with parents that due to small infant size he may have good BF and not so good BF episodes. Enc mom to BF for about 15 minutes and if infant sleepy stop the BF and offer the bottle. If infant active with feeding and feeding well at 15 minutes, leave him on th nurse longer.   Mom with large breasts and small short shaft nipples. Left breast is full and semi compressible, left breast more firm and non compressible. Ice pack given to use prior to pumping. Enc mom to ice for 20 minutes and pump every 2 hours until breast is softening with pumping and then stop the ice.   Mom to call for further assistance or with questions/concerns as needed. Report to C. Alan Ripper, Charity fundraiser.       Maternal Data Has patient been taught Hand Expression?: Yes Does the patient have breastfeeding experience  prior to this delivery?: No  Feeding Feeding Type: Breast Fed Length of feed: 15 min  LATCH Score Latch: Repeated attempts needed to sustain latch, nipple held in mouth throughout feeding, stimulation needed to elicit sucking reflex.  Audible Swallowing: Spontaneous and intermittent  Type of Nipple: Everted at rest and after stimulation  Comfort (Breast/Nipple): Filling, red/small blisters or bruises, mild/mod discomfort  Hold (Positioning): Assistance needed to correctly position infant at breast and maintain latch.  LATCH Score: 7  Interventions Interventions: Breast feeding basics reviewed;Support pillows;Assisted with latch;Position options;Skin to skin;Breast compression;Hand express  Lactation Tools Discussed/Used Tools: Nipple Shields Nipple shield size: 20   Consult Status Consult Status: PRN Follow-up type: Call as needed    Ed Blalock 02/16/2018, 11:31 AM

## 2018-02-23 ENCOUNTER — Encounter: Payer: Self-pay | Admitting: Student

## 2018-02-23 ENCOUNTER — Ambulatory Visit (INDEPENDENT_AMBULATORY_CARE_PROVIDER_SITE_OTHER): Payer: 59 | Admitting: Student

## 2018-02-23 DIAGNOSIS — M549 Dorsalgia, unspecified: Secondary | ICD-10-CM

## 2018-02-23 MED ORDER — PHENAZOPYRIDINE HCL 100 MG PO TABS: 100.0000 mg | ORAL_TABLET | Freq: Three times a day (TID) | ORAL | 0 refills | Status: DC | PRN

## 2018-02-23 NOTE — Progress Notes (Signed)
  Subjective:     [name], female   DOB: June 24, 1996, 22 y.o.   MRN: [id number]  HPI Patient Janet Rich is a 22 y.o. G1P1001 at 10 days postpartum here with complaints of burning when she urinates and feeling some stomach pain.  Patient had a VAVD with manual removal of retained products and a 2nd degree repair of perineum as well as vaginal lacerations.   Patient states that two days after she left the hospital she felt like her urethra was burning when she urinates. It has gotten worse over the last two days. She has some low back pain in the middle of her lower back. Denies foul-smelling discharge, uterine tenderness, fever, cloudy urine or other ob-gyn complaints.   Review of Systems  Constitutional: Negative.   HENT: Negative.   Eyes: Negative.   Respiratory: Negative.   Cardiovascular: Negative.   Gastrointestinal: Negative.   Genitourinary: Positive for dysuria and vaginal pain. Negative for vaginal bleeding and vaginal discharge.  Neurological: Negative.   Psychiatric/Behavioral: Negative.        Objective:   Physical Exam  HENT:  Head: Normocephalic.  Neck: Normal range of motion.  Pulmonary/Chest: Effort normal.  Abdominal: Soft. She exhibits no distension. There is no tenderness.  Genitourinary: Vagina normal.  Genitourinary Comments:  Laceration healing well; normal granulation tissue present on introitus; no signs of infection or wound dehisence.  Speculum and Bimanual deferred.   Musculoskeletal: Normal range of motion.  Neurological: She is alert.  Skin: Skin is warm.       Assessment:     1. Urine with large leukocytes 2. Normal granulation tissue present on introitus; no signs of infection or wound dehisence.      Plan:     1. Send urine for culture 2. Recommend sitz baths; avoid AZO due to unknown affects on breastfeeding.  3. Use peribottle with every urination, as well as dermoplast.  4. Reassured her that it will heal and  improve; if not improved in two weeks she should come in and be seen.     Luna Kitchens

## 2018-02-23 NOTE — Patient Instructions (Signed)
Care of a Perineal Tear °A perineal tear is a cut (laceration) in the tissue between the opening of the vagina and the anus (perineum). Some women naturally develop a perineal tear during a vaginal birth. This can happen as the baby emerges from the birth canal and the perineum is stretched. Perineal tears are graded based on how deep and long the laceration is. The grading for perineal tears is as follows: °· First degree. This involves a shallow tear at the edge of the vaginal opening that extends slightly into the perineal skin. °· Second degree. This involves tearing described in a first degree perineal tear and also a deeper tear of the vaginal opening and perineal tissues. It may also include tearing of a muscle just under the perineal skin. °· Third degree. This involves tearing described in a first and second degree perineal tear, with the tear extending into the muscle of the anus (anal sphincter). °· Fourth degree. This involves all levels of tear described for first, second, and third degree perineal tear, with the tear extending into the rectum. ° °First degree perineal tears may or may not be stitched closed, depending on their location and appearance. Second, third, and fourth degree perineal tears are stitched closed immediately after the baby’s birth. °What are the risks? °Depending on the type of perineal tear you have, you may be at risk for the following: °· Bleeding. °· Developing a collection of blood in the perineal tear area (hematoma). °· Pain. This may include pain with urination or bowel movements. °· Infection at the site of the tear. °· Fever. °· Trouble controlling your bowels (fecal incontinence). °· Painful sexual intercourse. ° °How to care for a perineal tear °· The first day, put ice on the area of the tear. °? Put ice in a plastic bag. °? Place a towel between your skin and the bag. °? Leave the ice on for 20 minutes, 2-3 times a day. °· Bathe using a warm sitz bath as directed by  your health care provider. This can speed up healing. Sitz baths can be performed in your bathtub or using a sitz bath kit that fits over your toilet. °? Place 3-4 in. (7.6-10 cm) of warm water in your bathtub or fill the sitz bath over-the-toilet container with warm water. Make sure the water is not too hot by placing a drop on your wrist. °? Sit in the warm water for 20-30 minutes. °? After bathing, pat your perineum dry with a clean towel. Do not scrub the perineum as this could cause pain, irritation, or open any stitches you may have. °? Keep the over-the-toilet sitz bath container clean by rinsing it thoroughly after each use. Ask for help in keeping the bathtub clean with diluted bleach and water (2 Tbsp [30 mL] of bleach to ½ gal [1.9 L] of water). °? Repeat the sitz bath as often as you would like to relieve perineal pain, itching, or discomfort. °· Apply a numbing spray to the perineal tear site as directed by your health care provider. This may help with discomfort. °· Wash your hands before and after applying medicine to the area. °· Put about 3 witch hazel-containing hemorrhoid treatment pads on top of your sanitary pad. The witch hazel in the hemorrhoid pads helps with discomfort and swelling. °· Get a squeeze bottle to squeeze warm water on your perineum when urinating, spraying the area from front to back. Pat the area to dry it. °· Sitting on an inflatable   ring or pillow may provide comfort.  Take medicines only as directed by your health care provider.  Do not have sexual intercourse or use tampons until your health care provider says it is okay. Typically, you must wait at least 6 weeks.  Keep all postpartum appointments as directed by your health care provider. Contact a health care provider if:  Your pain is not relieved with medicines.  You have painful urination.  You have a fever. Get help right away if:  You have redness, swelling, or increasing pain in the area of the  tear.  You have pus coming from the area of the tear.  You notice a bad smell coming from the area of the tear.  Your tear opens.  You notice swelling in the area of the tear that is larger than when you left the hospital.  You cannot urinate. This information is not intended to replace advice given to you by your health care provider. Make sure you discuss any questions you have with your health care provider. Document Released: 02/19/2014 Document Revised: 03/18/2016 Document Reviewed: 07/11/2013 Elsevier Interactive Patient Education  2017 [employer].

## 2018-02-24 LAB — POCT URINE QUALITATIVE DIPSTICK BLOOD: RBC UA: POSITIVE

## 2018-02-25 ENCOUNTER — Telehealth: Payer: Self-pay | Admitting: Student

## 2018-02-25 LAB — URINE CULTURE

## 2018-02-25 NOTE — Telephone Encounter (Signed)
Notified patient of negative urine results;continue plan of care.  Luna Kitchens CNM

## 2018-03-01 NOTE — Progress Notes (Signed)
Patient's been seen in the MAU for pain and burning at the site of her second-degree laceration.  Exam was reported as showing granulation tissue patient is aware we will see her if the discomfort continues

## 2018-03-11 ENCOUNTER — Encounter: Payer: Self-pay | Admitting: Obstetrics and Gynecology

## 2018-03-11 ENCOUNTER — Ambulatory Visit (INDEPENDENT_AMBULATORY_CARE_PROVIDER_SITE_OTHER): Payer: Medicaid Other | Admitting: Obstetrics and Gynecology

## 2018-03-11 DIAGNOSIS — Z87898 Personal history of other specified conditions: Secondary | ICD-10-CM | POA: Insufficient documentation

## 2018-03-11 DIAGNOSIS — N905 Atrophy of vulva: Secondary | ICD-10-CM | POA: Insufficient documentation

## 2018-03-11 DIAGNOSIS — Z1389 Encounter for screening for other disorder: Secondary | ICD-10-CM

## 2018-03-11 DIAGNOSIS — Z905 Acquired absence of kidney: Secondary | ICD-10-CM

## 2018-03-11 MED ORDER — ESTRADIOL 0.1 MG/GM VA CREA: TOPICAL_CREAM | VAGINAL | 1 refills | Status: DC

## 2018-03-11 NOTE — Progress Notes (Signed)
Obstetrics and Gynecology Postpartum Visit  Appointment Date: 03/11/2018  OBGYN Clinic: [facility] Chief Complaint: PP visit  History of Present Illness: Janet Rich is a 22 y.o. G1P1001 (Patient's last menstrual period was 05/10/2017.), seen for the above chief complaint. Her past medical history is significant for nothing   She is s/p SVD/2nd and right labial on 4/27; she was discharged to home on PPD#2. Patient with irritation around the urethra and with some SUI s/s.   Vaginal bleeding or discharge: No  Breast or formula feeding: pumping or feeding q3-4h Intercourse: No  Contraception after delivery: abstinence PP depression s/s: No  Any bowel or bladder issues: SUI Pap smear: no abnormalities (date: 2018)  Review of Systems:as noted in the History of Present Illness.  Medications Janet Rich had no medications administered during this visit. Current Outpatient Medications  Medication Sig Dispense Refill  . acetaminophen (TYLENOL) 500 MG tablet Take 500 mg by mouth every 6 (six) hours as needed for headache.    . albuterol (PROVENTIL HFA;VENTOLIN HFA) 108 (90 Base) MCG/ACT inhaler Inhale 2 puffs into the lungs every 4 (four) hours as needed for wheezing or shortness of breath. 1 Inhaler 1  . ibuprofen (ADVIL,MOTRIN) 600 MG tablet Take 1 tablet (600 mg total) by mouth every 6 (six) hours. 30 tablet 0  . polyethylene glycol (MIRALAX / GLYCOLAX) packet Take 17 g by mouth daily as needed for mild constipation. 14 each 0  . Prenat-Fe Carbonyl-FA-Omega 3 (ONE-A-DAY WOMENS PRENATAL 1 PO) Take by mouth.    . nystatin cream (MYCOSTATIN) Apply 1 application topically 3 (three) times daily. (Patient not taking: Reported on 02/23/2018) 30 g 3  . Pediatric Multiple Vit-C-FA (FLINSTONES GUMMIES OMEGA-3 DHA PO) Take 2 each by mouth daily.      No current facility-administered medications for this visit.     Allergies Other and Latex  Physical Exam:  BP 133/76 (BP  Location: Left Arm, Patient Position: Sitting, Cuff Size: Large)   Pulse 71   Resp 16   Ht  (1.549 m)   Wt 199 lb 3.2 oz (90.4 kg)   LMP 05/10/2017   Breastfeeding? Yes   BMI 37.64 kg/m  Body mass index is 37.64 kg/m. General appearance: Well nourished, well developed female in no acute distress.  Respiratory:  Normal respiratory effort Abdomen: positive bowel sounds and no masses, hernias; diffusely non tender to palpation, non distended Neuro/Psych:  Normal mood and affect.  Skin:  Warm and dry.  Lymphatic:  No inguinal lymphadenopathy.   Pelvic exam: is not limited by body habitus EGBUS: moderate atrophy. At the perineum, small amount of granulation and atrophy and vicryl suture still present there. Also some at the right labia minora. Sutures with visible knots cut and removed. Small amount still left in the perineum.  Vagina: within normal limits and with no blood in the vault, Cervix:  no lesions or cervical motion tenderness Uterus:  nonenlarged and approximately 8 week sized Adnexa:  normal adnexa and no mass, fullness, tenderness Rectovaginal: deferred  Laboratory: none  PP Depression Screening:  EPDS score 9  Assessment: pt stable  Plan:  D/w her that healing may be impaired due to estrogen deficiency from breast feeding. I told her I recommend continued pelvic rest and vaginal estrogen which she is amenable to.   RTC 4-6wks   Cornelia Copa MD Attending Center for [facility])

## 2018-04-06 ENCOUNTER — Encounter: Payer: Self-pay | Admitting: Obstetrics and Gynecology

## 2018-04-06 ENCOUNTER — Ambulatory Visit: Payer: 59 | Admitting: Obstetrics and Gynecology

## 2018-04-06 DIAGNOSIS — Z09 Encounter for follow-up examination after completed treatment for conditions other than malignant neoplasm: Secondary | ICD-10-CM

## 2018-04-06 NOTE — Progress Notes (Signed)
Patient did not keep GYN appointment for 04/06/2018.  Janet Rich, Jr MD Attending [facility])

## 2018-05-09 ENCOUNTER — Encounter: Payer: Self-pay | Admitting: Family Medicine

## 2018-05-09 ENCOUNTER — Ambulatory Visit (INDEPENDENT_AMBULATORY_CARE_PROVIDER_SITE_OTHER): Payer: 59 | Admitting: Family Medicine

## 2018-05-09 VITALS — BP 108/70 | HR 79 | Resp 16 | Ht 61.0 in | Wt 192.0 lb

## 2018-05-09 DIAGNOSIS — E282 Polycystic ovarian syndrome: Secondary | ICD-10-CM

## 2018-05-09 DIAGNOSIS — N809 Endometriosis, unspecified: Secondary | ICD-10-CM

## 2018-05-09 MED ORDER — IBUPROFEN 600 MG PO TABS: 600.0000 mg | ORAL_TABLET | Freq: Four times a day (QID) | ORAL | 3 refills | Status: DC

## 2018-05-09 NOTE — Progress Notes (Signed)
   Subjective:    [name], female    DOB: [birth year], 22 y.o.   MRN: [id number]  HPI Patient seen for PCOS and endometriosis. Patient is approximately 3 months postpartum and is exclusively breastfeeding. She was previously a Conservator, museum/gallery patient and had exploratory laparoscopy (robotic assisted), which showed multiple adhesions, endometriomas, and endometriosis in anterior and posterior cul-de-sac. She has also been diagnosed with PCOS. In order to get pregnant, she needed to use Clomid (attempted pregnancy for about a year unassisted prior to using Clomid). She has a history of bad menstrual pains, OCPs are helpful. Was on Lo Loestrin for quite a while.   Currently having mild pain. Would like to become pregnant again as soon as appropriate. She would ultimately like to have a hysterectomy due to the amount of pain she has. She would also not like to wait so long to do clomid if she doesn't get pregnant on her own.   Review of Systems     Objective:   Physical Exam  Constitutional: She is oriented to person, place, and time. She appears well-developed and well-nourished.  Neurological: She is alert and oriented to person, place, and time.  Skin: Skin is warm and dry.      Assessment & Plan:  1. PCOS (polycystic ovarian syndrome) 2. Endometriosis I discussed contraception with her, as well as recommendations of waiting at least a year prior to getting pregnant again. She would like to use OCPs, lo loestrin, when she wants contraception. She would like to wait on that for now. I advised her that we would likely recommend 3-6 months of trying on her own with ovulation kit prior to trying clomid. She appeared okay with that.

## 2018-07-09 ENCOUNTER — Ambulatory Visit
Admission: EM | Admit: 2018-07-09 | Discharge: 2018-07-09 | Disposition: A | Discharge status: Home / Self Care | Payer: 59 | Attending: Family Medicine | Admitting: Family Medicine

## 2018-07-09 ENCOUNTER — Other Ambulatory Visit: Payer: Self-pay

## 2018-07-09 ENCOUNTER — Encounter: Payer: Self-pay | Admitting: Gynecology

## 2018-07-09 DIAGNOSIS — R102 Pelvic and perineal pain: Secondary | ICD-10-CM

## 2018-07-09 DIAGNOSIS — R112 Nausea with vomiting, unspecified: Secondary | ICD-10-CM | POA: Diagnosis not present

## 2018-07-09 LAB — PREGNANCY, URINE: PREG TEST UR: NEGATIVE

## 2018-07-09 MED ORDER — ONDANSETRON 8 MG PO TBDP: 8.0000 mg | ORAL_TABLET | Freq: Three times a day (TID) | ORAL | 0 refills | Status: DC | PRN

## 2018-07-09 MED ORDER — IBUPROFEN 800 MG PO TABS: 800.0000 mg | ORAL_TABLET | Freq: Three times a day (TID) | ORAL | 0 refills | Status: DC

## 2018-07-09 NOTE — ED Triage Notes (Signed)
Per patient recurrent ovarian cyst on right side x 1 week. Per patient very painful.

## 2018-07-09 NOTE — ED Provider Notes (Signed)
[facility]    CSN: [id number] Arrival date & time: 07/09/18  1441     History   Chief Complaint Chief Complaint  Patient presents with  . Ovarian Cyst    HPI Janet Rich is a 22 y.o. female.   22 yo female with a c/o pelvic pain on both sides (right greater than left) for 1 week. States she has a h/o polycystic ovarian syndrome and symptoms feels similar to prior episodes of ovarian follicle rupture. Patient denies any fevers, chills, vaginal discharge, vomiting, diarrhea, constipation, dysuria. Patient states she's currently lactating/breastfeeding. Had some nausea and vomiting earlier in the week.   The history is provided by the patient.    Past Medical History:  Diagnosis Date  . Anemia    with periods  . Ankle fracture    right  . Anxiety   . Asthma   . Back pain   . Depression   . Endometriosis   . GERD (gastroesophageal reflux disease)   . Headache   . Ovarian cyst   . PCOS (polycystic ovarian syndrome)   . Pneumonia    age 82  . PONV (postoperative nausea and vomiting)    nausea after wisdom teeth extraction  . Vaginal Pap smear, abnormal    bx ok    Patient Active Problem List   Diagnosis Date Noted  . History of low birth weight 03/11/2018  . Vulvar atrophy 03/11/2018  . Dysmenorrhea 05/01/2015  . History of migraine headaches 05/01/2015  . Anxiety and depression 05/01/2015  . Increased BMI (body mass index) 05/01/2015  . Family history of endometriosis in first degree relative 05/01/2015    Past Surgical History:  Procedure Laterality Date  . ROBOTIC ASSISTED DIAGNOSTIC LAPAROSCOPY N/A 02/18/2017   Procedure: ROBOTIC ASSISTED DIAGNOSTIC LAPAROSCOPY,EXCISION AND ABLATION OF ENDOMETRIOSIS,LYSIS OF ADHESIONS;  Surgeon: Olivia Mackie, MD;  Location: [facility];  Service: Gynecology;  Laterality: N/A;  . WISDOM TOOTH EXTRACTION  2013    OB History    Gravida  1   Para  1   Term  1   Preterm  0   AB  0   Living  1     SAB    0   TAB  0   Ectopic  0   Multiple  0   Live Births  1            Home Medications    Prior to Admission medications   Medication Sig Start Date End Date Taking? Authorizing Provider  acetaminophen (TYLENOL) 500 MG tablet Take 500 mg by mouth every 6 (six) hours as needed for headache.   Yes [provider]  albuterol (PROVENTIL HFA;VENTOLIN HFA) 108 (90 Base) MCG/ACT inhaler Inhale 2 puffs into the lungs every 4 (four) hours as needed for wheezing or shortness of breath. 10/08/17  Yes Leftwich-Kirby, Wilmer Floor, CNM  Prenat-Fe Carbonyl-FA-Omega 3 (ONE-A-DAY WOMENS PRENATAL 1 PO) Take by mouth.   Yes [provider]  ibuprofen (ADVIL,MOTRIN) 800 MG tablet Take 1 tablet (800 mg total) by mouth 3 (three) times daily. 07/09/18   Payton Mccallum, MD  ondansetron (ZOFRAN ODT) 8 MG disintegrating tablet Take 1 tablet (8 mg total) by mouth every 8 (eight) hours as needed. 07/09/18   Payton Mccallum, MD    Family History Family History  Problem Relation Age of Onset  . Endometriosis Mother   . Diabetes Mother   . Depression Mother   . Breast cancer Paternal Grandmother   . Cancer  Paternal Grandmother   . Depression Paternal Grandmother   . Schizophrenia Father   . Depression Father   . Heart disease Neg Hx   . Colon cancer Neg Hx   . Ovarian cancer Neg Hx     Social History Social History   Tobacco Use  . Smoking status: Former Smoker    Types: Cigarettes    Last attempt to quit: 12/20/2009    Years since quitting: 8.5  . Smokeless tobacco: Never Used  . Tobacco comment: age [age], quit  Substance Use Topics  . Alcohol use: No    Frequency: Never    Comment: occassionally  . Drug use: No     Allergies   Other and Latex   Review of Systems Review of Systems   Physical Exam Triage Vital Signs ED Triage Vitals  Enc Vitals Group     BP 07/09/18 1457 120/76     Pulse Rate 07/09/18 1457 79     Resp 07/09/18 1457 16     Temp 07/09/18 1457 98.8 F  (37.1 C)     Temp Source 07/09/18 1457 Oral     SpO2 07/09/18 1457 100 %     Weight 07/09/18 1453 198 lb (89.8 kg)     Height 07/09/18 1453 5\' 1"  (1.549 m)     Head Circumference --      Peak Flow --      Pain Score 07/09/18 1453 5     Pain Loc --      Pain Edu? --      Excl. in GC? --    No data found.  Updated Vital Signs BP 120/76 (BP Location: Left Arm)   Pulse 79   Temp 98.8 F (37.1 C) (Oral)   Resp 16   Ht 5\' 1"  (1.549 m)   Wt 89.8 kg   SpO2 100%   Breastfeeding? Yes   BMI 37.41 kg/m   Visual Acuity Right Eye Distance:   Left Eye Distance:   Bilateral Distance:    Right Eye Near:   Left Eye Near:    Bilateral Near:     Physical Exam  Constitutional: She appears well-developed and well-nourished. No distress.  Skin: She is not diaphoretic.  Nursing note and vitals reviewed.    UC Treatments / Results  Labs (all labs ordered are listed, but only abnormal results are displayed) Labs Reviewed  PREGNANCY, URINE    EKG None  Radiology No results found.  Procedures Procedures (including critical care time)  Medications Ordered in UC Medications - No data to display  Initial Impression / Assessment and Plan / UC Course  I have reviewed the triage vital signs and the nursing notes.  Pertinent labs & imaging results that were available during my care of the patient were reviewed by me and considered in my medical decision making (see chart for details).      Final Clinical Impressions(s) / UC Diagnoses   Final diagnoses:  Pelvic pain in female  Non-intractable vomiting with nausea, unspecified vomiting type    ED Prescriptions    Medication Sig Dispense Auth. Provider   ondansetron (ZOFRAN ODT) 8 MG disintegrating tablet Take 1 tablet (8 mg total) by mouth every 8 (eight) hours as needed. 6 tablet Payton Mccallum, MD   ibuprofen (ADVIL,MOTRIN) 800 MG tablet Take 1 tablet (800 mg total) by mouth 3 (three) times daily. 30 tablet Payton Mccallum, MD     1. Lab results and diagnosis reviewed with patient/parent/guardian/family 2.  rx as per orders above; reviewed possible side effects, interactions, risks and benefits  3. Recommend supportive treatment heat 4. Go to Emergency Department if symptoms worsen 5. Follow-up prn    Controlled Substance Prescriptions [place] Controlled Substance Registry consulted? Not Applicable   Payton Mccallumonty, Beva Remund, MD 07/09/18 [id number]

## 2018-07-24 ENCOUNTER — Emergency Department
Admission: EM | Admit: 2018-07-24 | Discharge: 2018-07-24 | Disposition: A | Discharge status: Home / Self Care | Payer: 59 | Attending: Emergency Medicine | Admitting: Emergency Medicine

## 2018-07-24 DIAGNOSIS — R200 Anesthesia of skin: Secondary | ICD-10-CM | POA: Diagnosis not present

## 2018-07-24 DIAGNOSIS — Z87891 Personal history of nicotine dependence: Secondary | ICD-10-CM | POA: Diagnosis not present

## 2018-07-24 DIAGNOSIS — R42 Dizziness and giddiness: Secondary | ICD-10-CM

## 2018-07-24 DIAGNOSIS — Z79899 Other long term (current) drug therapy: Secondary | ICD-10-CM | POA: Diagnosis not present

## 2018-07-24 DIAGNOSIS — J45909 Unspecified asthma, uncomplicated: Secondary | ICD-10-CM | POA: Diagnosis not present

## 2018-07-24 LAB — BASIC METABOLIC PANEL
Anion gap: 10 (ref 5–15)
BUN: 15 mg/dL (ref 6–20)
CHLORIDE: 105 mmol/L (ref 98–111)
CO2: 23 mmol/L (ref 22–32)
CREATININE: 0.42 mg/dL — AB (ref 0.44–1.00)
Calcium: 9.4 mg/dL (ref 8.9–10.3)
GFR calc non Af Amer: >60 mL/min (ref >60)
Glucose, Bld: 95 mg/dL (ref 70–99)
Potassium: 4.1 mmol/L (ref 3.5–5.1)
SODIUM: 138 mmol/L (ref 135–145)

## 2018-07-24 LAB — URINALYSIS, COMPLETE (UACMP) WITH MICROSCOPIC
Bacteria, UA: NONE SEEN
Bilirubin Urine: NEGATIVE
Glucose, UA: NEGATIVE mg/dL
Hgb urine dipstick: NEGATIVE
KETONES UR: NEGATIVE mg/dL
Leukocytes, UA: NEGATIVE
Nitrite: NEGATIVE
PH: 6 (ref 5.0–8.0)
Protein, ur: NEGATIVE mg/dL
Specific Gravity, Urine: 1.023 (ref 1.005–1.030)

## 2018-07-24 LAB — CBC
HCT: 41.4 % (ref 35.0–47.0)
Hemoglobin: 13.6 g/dL (ref 12.0–16.0)
MCH: 26 pg (ref 26.0–34.0)
MCHC: 32.8 g/dL (ref 32.0–36.0)
MCV: 79.4 fL — ABNORMAL LOW (ref 80.0–100.0)
PLATELETS: 350 10*3/uL (ref 150–440)
RBC: 5.22 MIL/uL — AB (ref 3.80–5.20)
RDW: 15.8 % — ABNORMAL HIGH (ref 11.5–14.5)
WBC: 8.4 10*3/uL (ref 3.6–11.0)

## 2018-07-24 MED ORDER — PROCHLORPERAZINE EDISYLATE 10 MG/2ML IJ SOLN
10.0000 mg | Freq: Once | INTRAMUSCULAR | Status: AC
  Administered 2018-07-24: 10 mg via INTRAVENOUS
  Filled 2018-07-24: qty 2

## 2018-07-24 MED ORDER — SODIUM CHLORIDE 0.9 % IV BOLUS
1000.0000 mL | Freq: Once | INTRAVENOUS | Status: AC
  Administered 2018-07-24: 1000 mL via INTRAVENOUS

## 2018-07-24 MED ORDER — MECLIZINE HCL 25 MG PO TABS
25.0000 mg | ORAL_TABLET | Freq: Once | ORAL | Status: AC
  Administered 2018-07-24: 25 mg via ORAL
  Filled 2018-07-24: qty 1

## 2018-07-24 NOTE — Discharge Instructions (Addendum)
Please seek medical attention for any high fevers, chest pain, shortness of breath, change in behavior, persistent vomiting, bloody stool or any other new or concerning symptoms.  

## 2018-07-24 NOTE — ED Triage Notes (Signed)
Pt presents via POV c/o facial numbness, dizziness, and "fuzzy" feeling starting upon waking at 0600 this am. Last known well 2200 yesterday. Pt A&Ox4.

## 2018-07-24 NOTE — ED Notes (Signed)
Dr. Derrill Kay informed of patient status. No further orders.

## 2018-07-24 NOTE — ED Provider Notes (Signed)
[facility] Emergency Department Provider Note   ____________________________________________   I have reviewed the triage vital signs and the nursing notes.   HISTORY  Chief Complaint Dizziness and Numbness   History limited by: Not Limited   HPI Janet Rich is a 22 y.o. female who presents to the emergency department today because of concerns for facial altered sensation and dizziness.  Patient states that she first noticed the symptoms when she woke up at around 6 AM.  She thought it could be just because of his early in the morning so she tried going back to sleep.  However when she woke up at 9:00 she continued to have the symptoms.  She states that she feels like her face is numb.  She has to press really hard to get sensation.  This is occurring on both sides of her face.  Additionally the patient feels like her ears are in a cloud.  She has had some difficulty with walking.  She denies any recent trauma to her head.  Denies any recent illness.  Denies similar symptoms in the past.   Per medical record review patient has a history of anemia, headache.   Past Medical History:  Diagnosis Date  . Anemia    with periods  . Ankle fracture    right  . Anxiety   . Asthma   . Back pain   . Depression   . Endometriosis   . GERD (gastroesophageal reflux disease)   . Headache   . Ovarian cyst   . PCOS (polycystic ovarian syndrome)   . Pneumonia    age 80  . PONV (postoperative nausea and vomiting)    nausea after wisdom teeth extraction  . Vaginal Pap smear, abnormal    bx ok    Patient Active Problem List   Diagnosis Date Noted  . History of low birth weight 03/11/2018  . Vulvar atrophy 03/11/2018  . Dysmenorrhea 05/01/2015  . History of migraine headaches 05/01/2015  . Anxiety and depression 05/01/2015  . Increased BMI (body mass index) 05/01/2015  . Family history of endometriosis in first degree relative 05/01/2015    Past Surgical  History:  Procedure Laterality Date  . ROBOTIC ASSISTED DIAGNOSTIC LAPAROSCOPY N/A 02/18/2017   Procedure: ROBOTIC ASSISTED DIAGNOSTIC LAPAROSCOPY,EXCISION AND ABLATION OF ENDOMETRIOSIS,LYSIS OF ADHESIONS;  Surgeon: Olivia Mackie, MD;  Location: [facility];  Service: Gynecology;  Laterality: N/A;  . WISDOM TOOTH EXTRACTION  2013    Prior to Admission medications   Medication Sig Start Date End Date Taking? Authorizing Provider  acetaminophen (TYLENOL) 500 MG tablet Take 500 mg by mouth every 6 (six) hours as needed for headache.    [provider]  albuterol (PROVENTIL HFA;VENTOLIN HFA) 108 (90 Base) MCG/ACT inhaler Inhale 2 puffs into the lungs every 4 (four) hours as needed for wheezing or shortness of breath. 10/08/17   Leftwich-Kirby, Wilmer Floor, CNM  ibuprofen (ADVIL,MOTRIN) 800 MG tablet Take 1 tablet (800 mg total) by mouth 3 (three) times daily. 07/09/18   Payton Mccallum, MD  ondansetron (ZOFRAN ODT) 8 MG disintegrating tablet Take 1 tablet (8 mg total) by mouth every 8 (eight) hours as needed. 07/09/18   Payton Mccallum, MD  Prenat-Fe Carbonyl-FA-Omega 3 (ONE-A-DAY WOMENS PRENATAL 1 PO) Take by mouth.    [provider]    Allergies Other and Latex  Family History  Problem Relation Age of Onset  . Endometriosis Mother   . Diabetes Mother   . Depression Mother   .  Breast cancer Paternal Grandmother   . Cancer Paternal Grandmother   . Depression Paternal Grandmother   . Schizophrenia Father   . Depression Father   . Heart disease Neg Hx   . Colon cancer Neg Hx   . Ovarian cancer Neg Hx     Social History Social History   Tobacco Use  . Smoking status: Former Smoker    Types: Cigarettes    Last attempt to quit: 12/20/2009    Years since quitting: 8.5  . Smokeless tobacco: Never Used  . Tobacco comment: age [age], quit  Substance Use Topics  . Alcohol use: No    Frequency: Never    Comment: occassionally  . Drug use: No    Review of Systems Constitutional:  No fever/chills Eyes: No visual changes. ENT: Positive for alteration in hearing Cardiovascular: Denies chest pain. Respiratory: Denies shortness of breath. Gastrointestinal: No abdominal pain.  No nausea, no vomiting.  No diarrhea.   Genitourinary: Negative for dysuria. Musculoskeletal: Negative for back pain. Skin: Negative for rash. Neurological: Positive for facial change in sensation  ____________________________________________   PHYSICAL EXAM:  VITAL SIGNS: ED Triage Vitals [07/24/18 1139]  Enc Vitals Group     BP 125/69     Pulse Rate 82     Resp 14     Temp 98.5 F (36.9 C)     Temp Source Oral     SpO2 100 %     Weight 198 lb (89.8 kg)     Height      Head Circumference      Peak Flow      Pain Score 0   Constitutional: Alert and oriented.  Eyes: Conjunctivae are normal.  ENT      Head: Normocephalic and atraumatic.      Nose: No congestion/rhinnorhea.      Mouth/Throat: Mucous membranes are moist.      Neck: No stridor. Hematological/Lymphatic/Immunilogical: No cervical lymphadenopathy. Cardiovascular: Normal rate, regular rhythm.  No murmurs, rubs, or gallops. Respiratory: Normal respiratory effort without tachypnea nor retractions. Breath sounds are clear and equal bilaterally. No wheezes/rales/rhonchi. Gastrointestinal: Soft and non tender. No rebound. No guarding.  Genitourinary: Deferred Musculoskeletal: Normal range of motion in all extremities. No lower extremity edema. Neurologic:  Normal speech and language. No gross focal neurologic deficits are appreciated.  Skin:  Skin is warm, dry and intact. No rash noted. Psychiatric: Mood and affect are normal. Speech and behavior are normal. Patient exhibits appropriate insight and judgment.  ____________________________________________    LABS (pertinent positives/negatives)  CBC wbc 8.4, hgb 13.6, plt 350 BMP wnl except cr 0.42 ____________________________________________   EKG  I, Phineas Semen, attending physician, personally viewed and interpreted this EKG  EKG Time: 1150 Rate: 75 Rhythm: normal sinus rhythm Axis: normal Intervals: qtc 428 QRS: narrow ST changes: no st elevation Impression: normal ekg   ____________________________________________    RADIOLOGY  None  ____________________________________________   PROCEDURES  Procedures  ____________________________________________   INITIAL IMPRESSION / ASSESSMENT AND PLAN / ED COURSE  Pertinent labs & imaging results that were available during my care of the patient were reviewed by me and considered in my medical decision making (see chart for details).   Presented to the emergency department today because of concerns for some dizziness as well as some paresthesias looking on both sides of her face.  On exam patient is neuro intact.  Patient was given medication did feel improvement.  Did discuss possibility of getting neuroimaging with patient although  she felt comfortable deferring at this time.  I think that is completely reasonable given good resolution of the patient's symptoms and bilateral nature.  Did discuss return precautions.  Will give patient primary care follow-up information.  ____________________________________________   FINAL CLINICAL IMPRESSION(S) / ED DIAGNOSES  Final diagnoses:  Numbness  Dizziness     Note: This dictation was prepared with Dragon dictation. Any transcriptional errors that result from this process are unintentional     Phineas Semen, MD 07/24/18 [id number]

## 2018-07-24 NOTE — ED Notes (Signed)
Pt states she has numbness and tingling on the right side of her face.  Sensation intact on entire face.

## 2018-08-09 ENCOUNTER — Telehealth: Payer: Self-pay

## 2018-08-09 DIAGNOSIS — F321 Major depressive disorder, single episode, moderate: Secondary | ICD-10-CM

## 2018-08-09 NOTE — Telephone Encounter (Signed)
Received a call from patient mother regarding patient wanting to be seen for some depression and anxiety. She reports patient is not suicidal at this time however she is very sad and with a flat affect. She can not get in to see her pcp for weeks. I advised patient if she feels unsafe at this time she  Should report to the [facility] ER so that she can be check out by the [facility] physician there. Patient mother reports they have attempted to do that in the past and it did not work. After speaking with the provider in the office who recommended patient see JAMIE our Intergrated  Behavioral Health specialist in hopes of helping her talk out her feeling more. Patient and mother is agreeable with plan. Referral has placed to [facility].

## 2018-08-10 ENCOUNTER — Ambulatory Visit (INDEPENDENT_AMBULATORY_CARE_PROVIDER_SITE_OTHER): Payer: 59 | Admitting: Clinical

## 2018-08-10 DIAGNOSIS — F419 Anxiety disorder, unspecified: Secondary | ICD-10-CM | POA: Diagnosis not present

## 2018-08-10 DIAGNOSIS — F3130 Bipolar disorder, current episode depressed, mild or moderate severity, unspecified: Secondary | ICD-10-CM
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
## 2018-08-12 ENCOUNTER — Encounter: Payer: Self-pay | Admitting: Family Medicine

## 2018-08-12 ENCOUNTER — Ambulatory Visit: Payer: 59 | Admitting: Family Medicine

## 2018-08-12 VITALS — BP 124/72 | HR 95 | Temp 98.2°F | Resp 14 | Ht 61.0 in | Wt 197.9 lb

## 2018-08-12 DIAGNOSIS — Z8669 Personal history of other diseases of the nervous system and sense organs: Secondary | ICD-10-CM

## 2018-08-12 DIAGNOSIS — F3181 Bipolar II disorder: Secondary | ICD-10-CM | POA: Diagnosis not present

## 2018-08-12 DIAGNOSIS — R209 Unspecified disturbances of skin sensation: Secondary | ICD-10-CM

## 2018-08-12 DIAGNOSIS — F32A Depression, unspecified: Secondary | ICD-10-CM

## 2018-08-12 DIAGNOSIS — F419 Anxiety disorder, unspecified: Secondary | ICD-10-CM | POA: Diagnosis not present

## 2018-08-12 DIAGNOSIS — E282 Polycystic ovarian syndrome: Secondary | ICD-10-CM

## 2018-08-12 DIAGNOSIS — F329 Major depressive disorder, single episode, unspecified: Secondary | ICD-10-CM

## 2018-08-12 DIAGNOSIS — O9270 Unspecified disorders of lactation: Secondary | ICD-10-CM

## 2018-08-12 NOTE — Progress Notes (Signed)
Name: Janet Rich   MRN: [id number]    DOB: 04-Jan-1996   Date:08/15/2018       Progress Note  Subjective  Chief Complaint  Chief Complaint  Patient presents with  . Establish Care    HPI  PT presents to establish care and for the following:  Sensory change of face/ER follow Up: She works as a Child psychotherapist at [employer]; has a 27 month old son Valentina Lucks); is married to her husband Idelia Salm.  She was seen in the ER on 07/24/18 for facial numbness.  She was given meclizine and compazine and this did not help her much.  She has not had any symptoms since then.  She does note a history of migraines, but says this did not feel similar.  She was told it was possibly an inner ear issue.  She is asymptomatic today, so we will hold off on referral.  Discussed complex migraines vs inner ear issues.  Epley Maneuvers discussed in detail.   When she does get a migraine, she takes ibuprofen and this works along with a nap.  Bipolar II and Anxiety: - diagnosed for several years; taking Lamictal - started back on her medication yesterday after seeing psychaitry.  She is Rx'd Xanax PRN only.  Seeing Dr. Cathie Hoops.  Lactation: She has been trying to wean for about a month now but is an overproducer and is becoming too engorded.  She is still pumping twice a day to reduce discomfort only, however in just those two pumping sessions she produces more than her baby eats in the entire day.  She is concerned that she cannot reduce her output. She has PCOS - discussed how this can contribute to abnormal prolactin levels and overproduction.  Patient Active Problem List   Diagnosis Date Noted  . Bipolar 2 disorder (HCC) 08/12/2018  . History of low birth weight 03/11/2018  . Vulvar atrophy 03/11/2018  . Dysmenorrhea 05/01/2015  . History of migraine headaches 05/01/2015  . Anxiety and depression 05/01/2015  . Increased BMI (body mass index) 05/01/2015  . Family history of endometriosis in first degree relative  05/01/2015    Past Surgical History:  Procedure Laterality Date  . ROBOTIC ASSISTED DIAGNOSTIC LAPAROSCOPY N/A 02/18/2017   Procedure: ROBOTIC ASSISTED DIAGNOSTIC LAPAROSCOPY,EXCISION AND ABLATION OF ENDOMETRIOSIS,LYSIS OF ADHESIONS;  Surgeon: Olivia Mackie, MD;  Location: [facility];  Service: Gynecology;  Laterality: N/A;  . WISDOM TOOTH EXTRACTION  2013    Family History  Problem Relation Age of Onset  . Endometriosis Mother   . Bipolar disorder Mother   . Diabetes Mother        prediabetes  . Breast cancer Paternal Grandmother   . Cancer Paternal Grandmother   . Depression Paternal Grandmother   . Schizophrenia Father   . Depression Father   . Heart disease Neg Hx   . Colon cancer Neg Hx   . Ovarian cancer Neg Hx     Social History   Socioeconomic History  . Marital status: Married    Spouse name: Renae Fickle  . Number of children: 1  . Years of education: Not on file  . Highest education level: Not on file  Occupational History  . Occupation: Museum/gallery conservator  . Financial resource strain: Not hard at all  . Food insecurity:    Worry: Never true    Inability: Never true  . Transportation needs:    Medical: No    Non-medical: No  Tobacco Use  . Smoking status: Former  Smoker    Types: Cigarettes    Last attempt to quit: 12/20/2009    Years since quitting: 8.6  . Smokeless tobacco: Never Used  . Tobacco comment: age [age], quit  Substance and Sexual Activity  . Alcohol use: No    Frequency: Never    Comment: occassionally  . Drug use: No  . Sexual activity: Yes    Partners: Female, Female    Birth control/protection: None    Comment: last sex mid Jan 2019  Lifestyle  . Physical activity:    Days per week: 0 days    Minutes per session: 0 min  . Stress: Not at all  Relationships  . Social connections:    Talks on phone: More than three times a week    Gets together: More than three times a week    Attends religious service: Never    Active member of club or  organization: No    Attends meetings of clubs or organizations: Never    Relationship status: Married  . Intimate partner violence:    Fear of current or ex partner: No    Emotionally abused: No    Physically abused: No    Forced sexual activity: No  Other Topics Concern  . Not on file  Social History Narrative  . Not on file     Current Outpatient Medications:  .  acetaminophen (TYLENOL) 500 MG tablet, Take 500 mg by mouth every 6 (six) hours as needed for headache., Disp: , Rfl:  .  albuterol (PROVENTIL HFA;VENTOLIN HFA) 108 (90 Base) MCG/ACT inhaler, Inhale 2 puffs into the lungs every 4 (four) hours as needed for wheezing or shortness of breath., Disp: 1 Inhaler, Rfl: 1 .  ALPRAZolam (XANAX) 0.5 MG tablet, Take 0.5 mg by mouth at bedtime as needed for anxiety., Disp: , Rfl:  .  ibuprofen (ADVIL,MOTRIN) 800 MG tablet, Take 1 tablet (800 mg total) by mouth 3 (three) times daily., Disp: 30 tablet, Rfl: 0 .  lamoTRIgine (LAMICTAL) 100 MG tablet, lamotrigine 100 mg tablet  TAKE 1 TABLET BY MOUTH EVERYDAY, Disp: , Rfl:  .  ondansetron (ZOFRAN ODT) 8 MG disintegrating tablet, Take 1 tablet (8 mg total) by mouth every 8 (eight) hours as needed. (Patient not taking: Reported on 08/12/2018), Disp: 6 tablet, Rfl: 0 .  Prenat-Fe Carbonyl-FA-Omega 3 (ONE-A-DAY WOMENS PRENATAL 1 PO), Take by mouth., Disp: , Rfl:   Allergies  Allergen Reactions  . Other     Must have anti-emetic prior to being given any narcotics   . Latex Rash    I personally reviewed active problem list, medication list, allergies, family history, social history, health maintenance, lab results with the patient/caregiver today.   ROS Constitutional: Negative for fever or weight change.  Respiratory: Negative for cough and shortness of breath.   Cardiovascular: Negative for chest pain or palpitations.  Gastrointestinal: Negative for abdominal pain, no bowel changes.  Musculoskeletal: Negative for gait problem or joint  swelling.  Skin: Negative for rash.  Neurological: Negative for dizziness or headache.  No other specific complaints in a complete review of systems (except as listed in HPI above).  Objective  Vitals:   08/12/18 1231  BP: 124/72  Pulse: 95  Resp: 14  Temp: 98.2 F (36.8 C)  TempSrc: Oral  SpO2: 99%  Weight: 197 lb 14.4 oz (89.8 kg)  Height: 5\' 1"  (1.549 m)    Body mass index is 37.39 kg/m.  Physical Exam Constitutional: Patient appears well-developed and well-nourished.  No distress.  HENT: Head: Normocephalic and atraumatic. Ears: bilateral TMs with no erythema or effusion; Nose: Nose normal. Mouth/Throat: Oropharynx is clear and moist. No oropharyngeal exudate or tonsillar swelling.  Eyes: Conjunctivae and EOM are normal. No scleral icterus.  Pupils are equal, round, and reactive to light.  Neck: Normal range of motion. Neck supple. No JVD present. No thyromegaly present.  Cardiovascular: Normal rate, regular rhythm and normal heart sounds.  No murmur heard. No BLE edema. Pulmonary/Chest: Effort normal and breath sounds normal. No respiratory distress. Musculoskeletal: Normal range of motion, no joint effusions. No gross deformities Neurological: Pt is alert and oriented to person, place, and time. No cranial nerve deficit. Coordination, balance, strength, speech and gait are normal. No sensory deficit on the face. Skin: Skin is warm and dry. No rash noted. No erythema.  Psychiatric: Patient has a normal mood and affect. behavior is normal. Judgment and thought content normal.   No results found for this or any previous visit (from the past 72 hour(s)).  PHQ2/9: Depression screen [facility] 2/9 08/12/2018 08/10/2018  Decreased Interest 3 3  Down, Depressed, Hopeless 3 3  PHQ - 2 Score 6 6  Altered sleeping 0 2  Tired, decreased energy 2 2  Change in appetite 1 2  Feeling bad or failure about yourself  3 3  Trouble concentrating 0 3  Moving slowly or fidgety/restless 1 2    Suicidal thoughts 1 1  PHQ-9 Score 14 21  Difficult doing work/chores Somewhat difficult -   Fall Risk: Fall Risk  08/12/2018 01/04/2018  Falls in the past year? No No   Assessment & Plan  1. History of migraine headaches - Discussed complex migraines and how this may have caused her to have facial numbness.  Asymptomatic today, if symptoms return we will consider neurology referral.  Ibuprofen PRN  2. Anxiety and depression - Stable; keep follow up with Dr. Cathie Hoops  3. Bipolar 2 disorder (HCC) - Stable; keep follow up with Dr. Cathie Hoops  4. PCOS (polycystic ovarian syndrome) - Advised PCOS can sometimes cause increased prolactin levels that make it more difficult to wean/cause overproduction.  Advised to contact GYN and/or lactation consultant - she declines referral to lactation as she has a friend who is a Advertising copywriter.  5. Sensory disturbance - Discussed complex migraines and how this may have caused her to have facial numbness.  Asymptomatic today, if symptoms return we will consider neurology referral.  Ibuprofen PRN  6. Lactation problem - Advised PCOS can sometimes cause increased prolactin levels that make it more difficult to wean/cause overproduction.  Advised to contact GYN and/or lactation consultant - she declines referral to lactation as she has a friend who is a Advertising copywriter.

## 2018-08-25 ENCOUNTER — Ambulatory Visit: Payer: 59 | Admitting: Family Medicine

## 2018-08-25 ENCOUNTER — Encounter: Payer: Self-pay | Admitting: Family Medicine

## 2018-08-25 VITALS — BP 120/70 | HR 84 | Temp 97.9°F | Resp 16 | Ht 61.0 in | Wt 200.9 lb

## 2018-08-25 DIAGNOSIS — R42 Dizziness and giddiness: Secondary | ICD-10-CM

## 2018-08-25 DIAGNOSIS — K2100 Gastro-esophageal reflux disease with esophagitis, without bleeding: Secondary | ICD-10-CM

## 2018-08-25 MED ORDER — FAMOTIDINE 40 MG PO TABS: 40.0000 mg | ORAL_TABLET | Freq: Every day | ORAL | 0 refills | Status: DC

## 2018-08-25 MED ORDER — MECLIZINE HCL 25 MG PO TABS: 12.5000 mg | ORAL_TABLET | Freq: Three times a day (TID) | ORAL | 0 refills | Status: DC | PRN

## 2018-08-25 NOTE — Addendum Note (Signed)
Addended by: Doren Custard on: 08/25/2018 02:37 PM   Modules accepted: Orders

## 2018-08-25 NOTE — Progress Notes (Signed)
Name: Janet Rich   MRN: [id number]    DOB: 07/09/1996   Date:08/25/2018       Progress Note  Subjective  Chief Complaint  Chief Complaint  Patient presents with  . Dizziness    follow up  . Gastroesophageal Reflux    HPI  Pt presents to follow up on dizziness:  She had episode of vertigo 2 days ago - head felt full, very dizzy, had trouble balancing if she bent over. She tried Epley maneuvers and this did not help, took a warm shower and it didn't help. Took Zofran - she was vomiting at the time. She can now tell when she is going to have an episode of vertigo because she becomes nauseous the day before. This is her second major episode of vertigo in the last few months. We will refer to ENT today.  GERD: She has always had Reflux, but it was worse with pregnancy.  Had stomach ulcers as a child.  In the last week she has had regurgitation and some difficulty swallowing. Drinking lactose free milk frequently, taking Tums frequently, used to take Zantac. We will refer to GI for esophagitis.  No blood in stool, no dark and tarry stools.  Patient Active Problem List   Diagnosis Date Noted  . Bipolar 2 disorder (HCC) 08/12/2018  . History of low birth weight 03/11/2018  . Vulvar atrophy 03/11/2018  . Dysmenorrhea 05/01/2015  . History of migraine headaches 05/01/2015  . Anxiety and depression 05/01/2015  . Increased BMI (body mass index) 05/01/2015  . Family history of endometriosis in first degree relative 05/01/2015    Social History   Tobacco Use  . Smoking status: Former Smoker    Types: Cigarettes    Last attempt to quit: 12/20/2009    Years since quitting: 8.6  . Smokeless tobacco: Never Used  . Tobacco comment: age [age], quit  Substance Use Topics  . Alcohol use: No    Frequency: Never    Comment: occassionally     Current Outpatient Medications:  .  acetaminophen (TYLENOL) 500 MG tablet, Take 500 mg by mouth every 6 (six) hours as needed for headache., Disp: ,  Rfl:  .  albuterol (PROVENTIL HFA;VENTOLIN HFA) 108 (90 Base) MCG/ACT inhaler, Inhale 2 puffs into the lungs every 4 (four) hours as needed for wheezing or shortness of breath., Disp: 1 Inhaler, Rfl: 1 .  ALPRAZolam (XANAX) 0.5 MG tablet, Take 0.5 mg by mouth at bedtime as needed for anxiety., Disp: , Rfl:  .  ibuprofen (ADVIL,MOTRIN) 800 MG tablet, Take 1 tablet (800 mg total) by mouth 3 (three) times daily., Disp: 30 tablet, Rfl: 0 .  lamoTRIgine (LAMICTAL) 100 MG tablet, lamotrigine 100 mg tablet  TAKE 1 TABLET BY MOUTH EVERYDAY, Disp: , Rfl:  .  ondansetron (ZOFRAN ODT) 8 MG disintegrating tablet, Take 1 tablet (8 mg total) by mouth every 8 (eight) hours as needed., Disp: 6 tablet, Rfl: 0 .  Prenat-Fe Carbonyl-FA-Omega 3 (ONE-A-DAY WOMENS PRENATAL 1 PO), Take by mouth., Disp: , Rfl:   Allergies  Allergen Reactions  . Other     Must have anti-emetic prior to being given any narcotics   . Latex Rash    I personally reviewed active problem list, medication list, allergies, lab results with the patient/caregiver today.  ROS  Constitutional: Negative for fever or weight change.  Respiratory: Negative for cough and shortness of breath.   Cardiovascular: Negative for chest pain or palpitations.  Gastrointestinal: Negative for abdominal  pain, no bowel changes.  Musculoskeletal: Negative for gait problem or joint swelling.  Skin: Negative for rash.  Neurological: Negative for dizziness or headache.  No other specific complaints in a complete review of systems (except as listed in HPI above).  Objective  Vitals:   08/25/18 1405  BP: 120/70  Pulse: 84  Resp: 16  Temp: 97.9 F (36.6 C)  TempSrc: Oral  SpO2: 96%  Weight: 200 lb 14.4 oz (91.1 kg)  Height: 5\' 1"  (1.549 m)   *Body mass index is 37.96 kg/m.  Nursing Note and Vital Signs reviewed.  Physical Exam  Constitutional: Patient appears well-developed and well-nourished. No distress.  HENT: Head: Normocephalic and  atraumatic. Neck: Normal range of motion. Neck supple. No JVD present. No thyromegaly present.  Cardiovascular: Normal rate, regular rhythm and normal heart sounds.  No murmur heard. No BLE edema. Pulmonary/Chest: Effort normal and breath sounds normal. No respiratory distress. Abdominal: Soft. Bowel sounds are normal, no distension. There is no tenderness. No masses. No HSM. Musculoskeletal: Normal range of motion, no joint effusions. No gross deformities Neurological: Pt is alert and oriented to person, place, and time. No cranial nerve deficit. Coordination, balance, strength, speech and gait are normal.  Skin: Skin is warm and dry. No rash noted. No erythema.  Psychiatric: Patient has a normal mood and affect. behavior is normal. Judgment and thought content normal.  No results found for this or any previous visit (from the past 72 hour(s)).  Assessment & Plan  1. Dizziness - meclizine (ANTIVERT) 25 MG tablet; Take 0.5-1 tablets (12.5-25 mg total) by mouth 3 (three) times daily as needed for dizziness.  Dispense: 20 tablet; Refill: 0 - Ambulatory referral to ENT  2. Gastroesophageal reflux disease with esophagitis - famotidine (PEPCID) 40 MG tablet; Take 1 tablet (40 mg total) by mouth daily.  Dispense: 90 tablet; Refill: 0 - Ambulatory referral to Gastroenterology  -Red flags and when to present for emergency care or RTC including fever >101.34F, chest pain, shortness of breath, new/worsening/un-resolving symptoms, acute neurologic change, vomiting reviewed with patient at time of visit. Follow up and care instructions discussed and provided in AVS.

## 2018-08-28 LAB — CBC WITH DIFFERENTIAL/PLATELET
BASOS: 0 %
Basophils Absolute: 0 10*3/uL (ref 0.0–0.2)
EOS (ABSOLUTE): 0.2 10*3/uL (ref 0.0–0.4)
EOS: 3 %
HEMATOCRIT: 41.8 % (ref 34.0–46.6)
HEMOGLOBIN: 13.1 g/dL (ref 11.1–15.9)
IMMATURE GRANS (ABS): 0 10*3/uL (ref 0.0–0.1)
Immature Granulocytes: 0 %
LYMPHS: 38 %
Lymphocytes Absolute: 2.9 10*3/uL (ref 0.7–3.1)
MCH: 25.9 pg — ABNORMAL LOW (ref 26.6–33.0)
MCHC: 31.3 g/dL — ABNORMAL LOW (ref 31.5–35.7)
MCV: 83 fL (ref 79–97)
MONOCYTES: 5 %
Monocytes Absolute: 0.4 10*3/uL (ref 0.1–0.9)
Neutrophils Absolute: 4.1 10*3/uL (ref 1.4–7.0)
Neutrophils: 54 %
PLATELETS: 398 10*3/uL (ref 150–450)
RBC: 5.05 x10E6/uL (ref 3.77–5.28)
RDW: 14.6 % (ref 12.3–15.4)
WBC: 7.6 10*3/uL (ref 3.4–10.8)

## 2018-08-28 LAB — THYROID PANEL WITH TSH
FREE THYROXINE INDEX: 2.9 (ref 1.2–4.9)
T3 Uptake Ratio: 31 % (ref 24–39)
T4, Total: 9.4 ug/dL (ref 4.5–12.0)
TSH: 0.886 u[IU]/mL (ref 0.450–4.500)

## 2018-08-28 LAB — COMPREHENSIVE METABOLIC PANEL
A/G RATIO: 1.5 (ref 1.2–2.2)
ALK PHOS: 111 IU/L (ref 39–117)
ALT: 15 IU/L (ref 0–32)
AST: 13 IU/L (ref 0–40)
Albumin: 4.1 g/dL (ref 3.5–5.5)
BUN/Creatinine Ratio: 17 (ref 9–23)
BUN: 11 mg/dL (ref 6–20)
CHLORIDE: 100 mmol/L (ref 96–106)
CO2: 24 mmol/L (ref 20–29)
Calcium: 9.5 mg/dL (ref 8.7–10.2)
Creatinine, Ser: 0.63 mg/dL (ref 0.57–1.00)
GFR calc Af Amer: 146 mL/min/{1.73_m2} (ref >59)
GFR calc non Af Amer: 127 mL/min/{1.73_m2} (ref >59)
Globulin, Total: 2.8 g/dL (ref 1.5–4.5)
Glucose: 78 mg/dL (ref 65–99)
POTASSIUM: 4.4 mmol/L (ref 3.5–5.2)
Sodium: 138 mmol/L (ref 134–144)
Total Protein: 6.9 g/dL (ref 6.0–8.5)

## 2018-09-02 ENCOUNTER — Ambulatory Visit (INDEPENDENT_AMBULATORY_CARE_PROVIDER_SITE_OTHER): Payer: 59 | Admitting: Family Medicine

## 2018-09-02 ENCOUNTER — Encounter: Payer: Self-pay | Admitting: Family Medicine

## 2018-09-02 VITALS — BP 110/70 | HR 89 | Temp 97.9°F | Resp 16 | Ht 61.0 in | Wt 201.7 lb

## 2018-09-02 DIAGNOSIS — Z1159 Encounter for screening for other viral diseases: Secondary | ICD-10-CM | POA: Diagnosis not present

## 2018-09-02 DIAGNOSIS — F419 Anxiety disorder, unspecified: Secondary | ICD-10-CM | POA: Diagnosis not present

## 2018-09-02 DIAGNOSIS — Z0000 Encounter for general adult medical examination without abnormal findings: Secondary | ICD-10-CM

## 2018-09-02 DIAGNOSIS — Z13220 Encounter for screening for lipoid disorders: Secondary | ICD-10-CM

## 2018-09-02 DIAGNOSIS — E6609 Other obesity due to excess calories: Secondary | ICD-10-CM | POA: Insufficient documentation

## 2018-09-02 DIAGNOSIS — E66811 Obesity, class 1: Secondary | ICD-10-CM | POA: Insufficient documentation

## 2018-09-02 DIAGNOSIS — F329 Major depressive disorder, single episode, unspecified: Secondary | ICD-10-CM

## 2018-09-02 DIAGNOSIS — Z6838 Body mass index (BMI) 38.0-38.9, adult: Secondary | ICD-10-CM

## 2018-09-02 DIAGNOSIS — F32A Depression, unspecified: Secondary | ICD-10-CM

## 2018-09-02 NOTE — Patient Instructions (Addendum)
Preventive Care 18-39 Years, Female Preventive care refers to lifestyle choices and visits with your health care provider that can promote health and wellness. What does preventive care include?  A yearly physical exam. This is also called an annual well check.  Dental exams once or twice a year.  Routine eye exams. Ask your health care provider how often you should have your eyes checked.  Personal lifestyle choices, including: ? Daily care of your teeth and gums. ? Regular physical activity. ? Eating a healthy diet. ? Avoiding tobacco and drug use. ? Limiting alcohol use. ? Practicing safe sex. ? Taking vitamin and mineral supplements as recommended by your health care provider. What happens during an annual well check? The services and screenings done by your health care provider during your annual well check will depend on your age, overall health, lifestyle risk factors, and family history of disease. Counseling Your health care provider may ask you questions about your:  Alcohol use.  Tobacco use.  Drug use.  Emotional well-being.  Home and relationship well-being.  Sexual activity.  Eating habits.  Work and work Statistician.  Method of birth control.  Menstrual cycle.  Pregnancy history.  Screening You may have the following tests or measurements:  Height, weight, and BMI.  Diabetes screening. This is done by checking your blood sugar (glucose) after you have not eaten for a while (fasting).  Blood pressure.  Lipid and cholesterol levels. These may be checked every 5 years starting at age 66.  Skin check.  Hepatitis C blood test.  Hepatitis B blood test.  Sexually transmitted disease (STD) testing.  BRCA-related cancer screening. This may be done if you have a family history of breast, ovarian, tubal, or peritoneal cancers.  Pelvic exam and Pap test. This may be done every 3 years starting at age 40. Starting at age 59, this may be done every 5  years if you have a Pap test in combination with an HPV test.  Discuss your test results, treatment options, and if necessary, the need for more tests with your health care provider. Vaccines Your health care provider may recommend certain vaccines, such as:  Influenza vaccine. This is recommended every year.  Tetanus, diphtheria, and acellular pertussis (Tdap, Td) vaccine. You may need a Td booster every 10 years.  Varicella vaccine. You may need this if you have not been vaccinated.  HPV vaccine. If you are 69 or younger, you may need three doses over 6 months.  Measles, mumps, and rubella (MMR) vaccine. You may need at least one dose of MMR. You may also need a second dose.  Pneumococcal 13-valent conjugate (PCV13) vaccine. You may need this if you have certain conditions and were not previously vaccinated.  Pneumococcal polysaccharide (PPSV23) vaccine. You may need one or two doses if you smoke cigarettes or if you have certain conditions.  Meningococcal vaccine. One dose is recommended if you are age 27-21 years and a first-year college student living in a residence hall, or if you have one of several medical conditions. You may also need additional booster doses.  Hepatitis A vaccine. You may need this if you have certain conditions or if you travel or work in places where you may be exposed to hepatitis A.  Hepatitis B vaccine. You may need this if you have certain conditions or if you travel or work in places where you may be exposed to hepatitis B.  Haemophilus influenzae type b (Hib) vaccine. You may need this if  you have certain risk factors.  Talk to your health care provider about which screenings and vaccines you need and how often you need them. This information is not intended to replace advice given to you by your health care provider. Make sure you discuss any questions you have with your health care provider. Document Released: 12/01/2001 Document Revised: 06/24/2016  Document Reviewed: 08/06/2015 Elsevier Interactive Patient Education  2018 [employer].  Kegel Exercises Kegel exercises help strengthen the muscles that support the rectum, vagina, small intestine, bladder, and uterus. Doing Kegel exercises can help:  Improve bladder and bowel control.  Improve sexual response.  Reduce problems and discomfort during pregnancy.  Kegel exercises involve squeezing your pelvic floor muscles, which are the same muscles you squeeze when you try to stop the flow of urine. The exercises can be done while sitting, standing, or lying down, but it is best to vary your position. Phase 1 exercises 1. Squeeze your pelvic floor muscles tight. You should feel a tight lift in your rectal area. If you are a female, you should also feel a tightness in your vaginal area. Keep your stomach, buttocks, and legs relaxed. 2. Hold the muscles tight for up to 10 seconds. 3. Relax your muscles. Repeat this exercise 50 times a day or as many times as told by your health care provider. Continue to do this exercise for at least 4-6 weeks or for as long as told by your health care provider. This information is not intended to replace advice given to you by your health care provider. Make sure you discuss any questions you have with your health care provider. Document Released: 09/21/2012 Document Revised: 05/30/2016 Document Reviewed: 08/25/2015 Elsevier Interactive Patient Education  [employer].

## 2018-09-02 NOTE — Progress Notes (Signed)
Name: Janet Rich   MRN: [id number]    DOB: 06-Nov-1995   Date:09/02/2018       Progress Note  Subjective  Chief Complaint  Chief Complaint  Patient presents with  . Annual Exam    HPI  Patient presents for annual CPE.  Diet: Eats cereal in the morning; has a meat for dinner, will get fast food, or eat at [employer]; eating salads throughout the week.   Exercise: Does some crunches sometimes; thinking about going back to school and taking the steps when she is on campus.  Otherwise not exercising - education provided  USPSTF grade A and B recommendations    Office Visit from 09/02/2018 in [facility]  AUDIT-C Score  0     Depression: Feeling better today. Depression screen [facility] 2/9 09/02/2018 08/25/2018 08/12/2018 08/10/2018  Decreased Interest 1 1 3 3   Down, Depressed, Hopeless 1 1 3 3   PHQ - 2 Score 2 2 6 6   Altered sleeping 0 0 0 2  Tired, decreased energy 1 1 2 2   Change in appetite 1 2 1 2   Feeling bad or failure about yourself  1 1 3 3   Trouble concentrating 0 0 0 3  Moving slowly or fidgety/restless 0 0 1 2  Suicidal thoughts 0 0 1 1  PHQ-9 Score 5 6 14 21   Difficult doing work/chores Not difficult at all Somewhat difficult Somewhat difficult -   Hypertension: BP Readings from Last 3 Encounters:  09/02/18 110/70  08/25/18 120/70  08/12/18 124/72   Obesity: Wt Readings from Last 3 Encounters:  09/02/18 201 lb 11.2 oz (91.5 kg)  08/25/18 200 lb 14.4 oz (91.1 kg)  08/12/18 197 lb 14.4 oz (89.8 kg)   BMI Readings from Last 3 Encounters:  09/02/18 38.11 kg/m  08/25/18 37.96 kg/m  08/12/18 37.39 kg/m    Hep C Screening: We will order today STD testing and prevention (HIV/chl/gon/syphilis): 06/17/2017 Negative HIV; No new partners in the last year Intimate partner violence: No concerns Sexual History/Pain during Intercourse: Always has some discomfort with intercourse - seeing GYN. Menstrual History/LMP/Abnormal Bleeding: Has had 1  period since giving birth - right around 7 weeks pp. Incontinence Symptoms: She has some stress incontinence since giving birth. Discussed Kegels, declines PT at this time.  Advanced Care Planning: A voluntary discussion about advance care planning including the explanation and discussion of advance directives.  Discussed health care proxy and Living will, and the patient was able to identify a health care proxy as Elmer Picker.  Patient does not have a living will at present time. If patient does have living will, I have requested they bring this to the clinic to be scanned in to their chart.  Breast cancer: Paternal Grandmother, no other family members. No concerning areas per patient; she is currently lactating - pumping twice daily. No results found for: [facility]  BRCA gene screening: Not indicated. Cervical cancer screening: 05/05/2017 - Normal  Osteoporosis Screening: No family history. No results found for: HMDEXASCAN  Lipids: We will order at follow up, does not want to have labs today.  Glucose:  Glucose  Date Value Ref Range Status  08/25/2018 78 65 - 99 mg/dL Final  07/11/2014 89 65 - 99 mg/dL Final  08/21/2013 80 65 - 99 mg/dL Final   Glucose, Bld  Date Value Ref Range Status  07/24/2018 95 70 - 99 mg/dL Final  02/12/2018 85 65 - 99 mg/dL Final  12/28/2017 87 65 - 99 mg/dL  Final   Skin cancer: No concerning lesions Colorectal cancer: No family or personal history Lung cancer:  Never Smoker.  Low Dose CT Chest recommended if Age 71-80 years, 30 pack-year currently smoking OR have quit w/in 15years. Patient does not qualify.   ECG: On file  Patient Active Problem List   Diagnosis Date Noted  . Bipolar 2 disorder ([place]) 08/12/2018  . History of low birth weight 03/11/2018  . Vulvar atrophy 03/11/2018  . Dysmenorrhea 05/01/2015  . History of migraine headaches 05/01/2015  . Anxiety and depression 05/01/2015  . Increased BMI (body mass index) 05/01/2015  . Family history  of endometriosis in first degree relative 05/01/2015    Past Surgical History:  Procedure Laterality Date  . ROBOTIC ASSISTED DIAGNOSTIC LAPAROSCOPY N/A 02/18/2017   Procedure: ROBOTIC ASSISTED DIAGNOSTIC LAPAROSCOPY,EXCISION AND ABLATION OF ENDOMETRIOSIS,LYSIS OF ADHESIONS;  Surgeon: Brien Few, MD;  Location: [facility];  Service: Gynecology;  Laterality: N/A;  . WISDOM TOOTH EXTRACTION  2013    Family History  Problem Relation Age of Onset  . Endometriosis Mother   . Bipolar disorder Mother   . Diabetes Mother        prediabetes  . Breast cancer Paternal Grandmother   . Cancer Paternal Grandmother   . Depression Paternal Grandmother   . Schizophrenia Father   . Depression Father   . Heart disease Neg Hx   . Colon cancer Neg Hx   . Ovarian cancer Neg Hx     Social History   Socioeconomic History  . Marital status: Married    Spouse name: Eddie Dibbles  . Number of children: 1  . Years of education: Not on file  . Highest education level: Not on file  Occupational History  . Occupation: Proofreader  . Financial resource strain: Not hard at all  . Food insecurity:    Worry: Never true    Inability: Never true  . Transportation needs:    Medical: No    Non-medical: No  Tobacco Use  . Smoking status: Former Smoker    Types: Cigarettes    Last attempt to quit: 12/20/2009    Years since quitting: 8.7  . Smokeless tobacco: Never Used  . Tobacco comment: age [age], quit  Substance and Sexual Activity  . Alcohol use: No    Frequency: Never    Comment: occassionally  . Drug use: No  . Sexual activity: Yes    Partners: Female, Female    Birth control/protection: None    Comment: last sex mid Jan 2019  Lifestyle  . Physical activity:    Days per week: 0 days    Minutes per session: 0 min  . Stress: Not at all  Relationships  . Social connections:    Talks on phone: More than three times a week    Gets together: More than three times a week    Attends religious  service: Never    Active member of club or organization: No    Attends meetings of clubs or organizations: Never    Relationship status: Married  . Intimate partner violence:    Fear of current or ex partner: No    Emotionally abused: No    Physically abused: No    Forced sexual activity: No  Other Topics Concern  . Not on file  Social History Narrative  . Not on file     Current Outpatient Medications:  .  acetaminophen (TYLENOL) 500 MG tablet, Take 500 mg by mouth every  6 (six) hours as needed for headache., Disp: , Rfl:  .  albuterol (PROVENTIL HFA;VENTOLIN HFA) 108 (90 Base) MCG/ACT inhaler, Inhale 2 puffs into the lungs every 4 (four) hours as needed for wheezing or shortness of breath., Disp: 1 Inhaler, Rfl: 1 .  ALPRAZolam (XANAX) 0.5 MG tablet, Take 0.5 mg by mouth at bedtime as needed for anxiety., Disp: , Rfl:  .  famotidine (PEPCID) 40 MG tablet, Take 1 tablet (40 mg total) by mouth daily., Disp: 90 tablet, Rfl: 0 .  ibuprofen (ADVIL,MOTRIN) 800 MG tablet, Take 1 tablet (800 mg total) by mouth 3 (three) times daily., Disp: 30 tablet, Rfl: 0 .  lamoTRIgine (LAMICTAL) 100 MG tablet, lamotrigine 100 mg tablet  TAKE 1 TABLET BY MOUTH EVERYDAY, Disp: , Rfl:  .  meclizine (ANTIVERT) 25 MG tablet, Take 0.5-1 tablets (12.5-25 mg total) by mouth 3 (three) times daily as needed for dizziness., Disp: 20 tablet, Rfl: 0 .  ondansetron (ZOFRAN ODT) 8 MG disintegrating tablet, Take 1 tablet (8 mg total) by mouth every 8 (eight) hours as needed., Disp: 6 tablet, Rfl: 0 .  Prenat-Fe Carbonyl-FA-Omega 3 (ONE-A-DAY WOMENS PRENATAL 1 PO), Take by mouth., Disp: , Rfl:   Allergies  Allergen Reactions  . Other     Must have anti-emetic prior to being given any narcotics   . Latex Rash   ROS  Constitutional: Negative for fever or weight change.  Respiratory: Negative for cough and shortness of breath.   Cardiovascular: Negative for chest pain or palpitations.  Gastrointestinal: Negative for  abdominal pain, no bowel changes.  Musculoskeletal: Negative for gait problem or joint swelling.  Skin: Negative for rash.  Neurological: Dizziness is improving; headaches are chronic for her. No other specific complaints in a complete review of systems (except as listed in HPI above).  Objective  Vitals:   09/02/18 1253  BP: 110/70  Pulse: 89  Resp: 16  Temp: 97.9 F (36.6 C)  TempSrc: Oral  SpO2: 99%  Weight: 201 lb 11.2 oz (91.5 kg)  Height: 5' 1"  (1.549 m)   Body mass index is 38.11 kg/m.  Physical Exam  Constitutional: Patient appears well-developed and well-nourished. No distress.  HENT: Head: Normocephalic and atraumatic. Ears: B TMs ok, no erythema or effusion; Nose: Nose normal. Mouth/Throat: Oropharynx is clear and moist. No oropharyngeal exudate.  Eyes: Conjunctivae and EOM are normal. Pupils are equal, round, and reactive to light. No scleral icterus.  Neck: Normal range of motion. Neck supple. No JVD present. No thyromegaly present.  Cardiovascular: Normal rate, regular rhythm and normal heart sounds.  No murmur heard. No BLE edema. Pulmonary/Chest: Effort normal and breath sounds normal. No respiratory distress. Abdominal: Soft. Bowel sounds are normal, no distension. There is no tenderness. no masses Breast: no lumps or masses, no nipple discharge or rashes FEMALE GENITALIA: Deferred Musculoskeletal: Normal range of motion, no joint effusions. No gross deformities Neurological: he is alert and oriented to person, place, and time. No cranial nerve deficit. Coordination, balance, strength, speech and gait are normal.  Skin: Skin is warm and dry. No rash noted. No erythema.  Psychiatric: Patient has a normal mood and affect. behavior is normal. Judgment and thought content normal.  Recent Results (from the past 2160 hour(s))  Pregnancy, urine     Status: None   Collection Time: 07/09/18  3:27 PM  Result Value Ref Range   Preg Test, Ur NEGATIVE NEGATIVE     Comment: Performed at [facility], [phone]  [address] [place], [place] [address]  Basic metabolic panel     Status: Abnormal   Collection Time: 07/24/18 11:52 AM  Result Value Ref Range   Sodium 138 135 - 145 mmol/L   Potassium 4.1 3.5 - 5.1 mmol/L   Chloride 105 98 - 111 mmol/L   CO2 23 22 - 32 mmol/L   Glucose, Bld 95 70 - 99 mg/dL   BUN 15 6 - 20 mg/dL   Creatinine, Ser 0.42 (L) 0.44 - 1.00 mg/dL   Calcium 9.4 8.9 - 10.3 mg/dL   GFR calc non Af Amer >60 >60 mL/min   GFR calc Af Amer >60 >60 mL/min    Comment: (NOTE) The eGFR has been calculated using the CKD EPI equation. This calculation has not been validated in all clinical situations. eGFR's persistently <60 mL/min signify possible Chronic Kidney Disease.    Anion gap 10 5 - 15    Comment: Performed at [facility], [address] [place], [place] [address]  CBC     Status: Abnormal   Collection Time: 07/24/18 11:52 AM  Result Value Ref Range   WBC 8.4 3.6 - 11.0 K/uL   RBC 5.22 (H) 3.80 - 5.20 MIL/uL   Hemoglobin 13.6 12.0 - 16.0 g/dL   HCT 41.4 35.0 - 47.0 %   MCV 79.4 (L) 80.0 - 100.0 fL   MCH 26.0 26.0 - 34.0 pg   MCHC 32.8 32.0 - 36.0 g/dL   RDW 15.8 (H) 11.5 - 14.5 %   Platelets 350 150 - 440 K/uL    Comment: Performed at [facility], [address] [place], [place] [address]  Urinalysis, Complete w Microscopic     Status: Abnormal   Collection Time: 07/24/18  1:27 PM  Result Value Ref Range   Color, Urine YELLOW (A) YELLOW   APPearance CLEAR (A) CLEAR   Specific Gravity, Urine 1.023 1.005 - 1.030   pH 6.0 5.0 - 8.0   Glucose, UA NEGATIVE NEGATIVE mg/dL   Hgb urine dipstick NEGATIVE NEGATIVE   Bilirubin Urine NEGATIVE NEGATIVE   Ketones, ur NEGATIVE NEGATIVE mg/dL   Protein, ur NEGATIVE NEGATIVE mg/dL   Nitrite NEGATIVE NEGATIVE   Leukocytes, UA NEGATIVE NEGATIVE   RBC / HPF 0-5 0 - 5 RBC/hpf   WBC, UA 0-5 0 - 5 WBC/hpf   Bacteria, UA NONE SEEN NONE SEEN   Squamous  Epithelial / LPF 0-5 0 - 5   Mucus PRESENT     Comment: Performed at [facility], [address] [place],  [address]  CBC w/Diff/Platelet     Status: Abnormal   Collection Time: 08/25/18  2:48 PM  Result Value Ref Range   WBC 7.6 3.4 - 10.8 x10E3/uL   RBC 5.05 3.77 - 5.28 x10E6/uL   Hemoglobin 13.1 11.1 - 15.9 g/dL   Hematocrit 41.8 34.0 - 46.6 %   MCV 83 79 - 97 fL   MCH 25.9 (L) 26.6 - 33.0 pg   MCHC 31.3 (L) 31.5 - 35.7 g/dL   RDW 14.6 12.3 - 15.4 %   Platelets 398 150 - 450 x10E3/uL   Neutrophils 54 Not Estab. %   Lymphs 38 Not Estab. %   Monocytes 5 Not Estab. %   Eos 3 Not Estab. %   Basos 0 Not Estab. %   Neutrophils Absolute 4.1 1.4 - 7.0 x10E3/uL   Lymphocytes Absolute 2.9 0.7 - 3.1 x10E3/uL   Monocytes Absolute 0.4 0.1 - 0.9 x10E3/uL  EOS (ABSOLUTE) 0.2 0.0 - 0.4 x10E3/uL   Basophils Absolute 0.0 0.0 - 0.2 x10E3/uL   Immature Granulocytes 0 Not Estab. %   Immature Grans (Abs) 0.0 0.0 - 0.1 x10E3/uL  Thyroid Panel With TSH     Status: None   Collection Time: 08/25/18  2:48 PM  Result Value Ref Range   TSH 0.886 0.450 - 4.500 uIU/mL   T4, Total 9.4 4.5 - 12.0 ug/dL   T3 Uptake Ratio 31 24 - 39 %   Free Thyroxine Index 2.9 1.2 - 4.9  Comprehensive Metabolic Panel (CMET)     Status: None   Collection Time: 08/25/18  2:48 PM  Result Value Ref Range   Glucose 78 65 - 99 mg/dL   BUN 11 6 - 20 mg/dL   Creatinine, Ser 0.63 0.57 - 1.00 mg/dL   GFR calc non Af Amer 127 >59 mL/min/1.73   GFR calc Af Amer 146 >59 mL/min/1.73   BUN/Creatinine Ratio 17 9 - 23   Sodium 138 134 - 144 mmol/L   Potassium 4.4 3.5 - 5.2 mmol/L   Chloride 100 96 - 106 mmol/L   CO2 24 20 - 29 mmol/L   Calcium 9.5 8.7 - 10.2 mg/dL   Total Protein 6.9 6.0 - 8.5 g/dL   Albumin 4.1 3.5 - 5.5 g/dL   Globulin, Total 2.8 1.5 - 4.5 g/dL   Albumin/Globulin Ratio 1.5 1.2 - 2.2   Bilirubin Total <0.2 0.0 - 1.2 mg/dL   Alkaline Phosphatase 111 39 - 117 IU/L   AST 13 0 - 40 IU/L   ALT  15 0 - 32 IU/L   PHQ2/9: Depression screen [facility] 2/9 09/02/2018 08/25/2018 08/12/2018 08/10/2018  Decreased Interest 1 1 3 3   Down, Depressed, Hopeless 1 1 3 3   PHQ - 2 Score 2 2 6 6   Altered sleeping 0 0 0 2  Tired, decreased energy 1 1 2 2   Change in appetite 1 2 1 2   Feeling bad or failure about yourself  1 1 3 3   Trouble concentrating 0 0 0 3  Moving slowly or fidgety/restless 0 0 1 2  Suicidal thoughts 0 0 1 1  PHQ-9 Score 5 6 14 21   Difficult doing work/chores Not difficult at all Somewhat difficult Somewhat difficult -   Fall Risk: Fall Risk  08/25/2018 08/12/2018 01/04/2018  Falls in the past year? 0 No No   Assessment & Plan  1. Well woman exam (no gynecological exam) -USPSTF grade A and B recommendations reviewed with patient; age-appropriate recommendations, preventive care, screening tests, etc discussed and encouraged; healthy living encouraged; see AVS for patient education given to patient -Discussed importance of 150 minutes of physical activity weekly, eat two servings of fish weekly, eat one serving of tree nuts ( cashews, pistachios, pecans, almonds.Marland Kitchen) every other day, eat 6 servings of fruit/vegetables daily and drink plenty of water and avoid sweet beverages.  - Lipid panel - Hepatitis C antibody - CBC w/Diff/Platelet  2. Anxiety and depression - Seeing Psychiatry  3. Class 2 obesity due to excess calories without serious comorbidity with body mass index (BMI) of 38.0 to 38.9 in adult - See above regarding health teaching - Lipid panel  4. Need for hepatitis C screening test - Hepatitis C antibody  5. Lipid screening - Lipid panel

## 2018-10-24 ENCOUNTER — Encounter: Payer: Self-pay | Admitting: Gastroenterology

## 2018-10-24 ENCOUNTER — Ambulatory Visit: Payer: BLUE CROSS/BLUE SHIELD | Admitting: Gastroenterology

## 2018-10-24 VITALS — BP 109/80 | HR 93 | Resp 17 | Ht 61.0 in | Wt 205.8 lb

## 2018-10-24 DIAGNOSIS — R1013 Epigastric pain: Secondary | ICD-10-CM | POA: Diagnosis not present

## 2018-10-24 DIAGNOSIS — K580 Irritable bowel syndrome with diarrhea: Secondary | ICD-10-CM

## 2018-10-24 MED ORDER — DICYCLOMINE HCL 10 MG PO CAPS: 10.0000 mg | ORAL_CAPSULE | Freq: Three times a day (TID) | ORAL | 0 refills | Status: DC

## 2018-10-24 MED ORDER — AMITRIPTYLINE HCL 25 MG PO TABS: 25.0000 mg | ORAL_TABLET | Freq: Every day | ORAL | 1 refills | Status: DC

## 2018-10-24 MED ORDER — OMEPRAZOLE 40 MG PO CPDR: 40.0000 mg | DELAYED_RELEASE_CAPSULE | Freq: Every day | ORAL | 1 refills | Status: DC

## 2018-10-24 NOTE — Progress Notes (Signed)
Cephas Darby, MD [address]  [place]  [place], [place] [address]  Main: [phone]  Fax: [phone]    Gastroenterology Consultation  Referring Provider:     Hubbard Hartshorn, FNP Primary Care Physician:  Hubbard Hartshorn, FNP Primary Gastroenterologist:  Dr. Cephas Darby Reason for Consultation:     Chronic diarrhea and abdominal cramps        HPI:   Janet Rich is a 23 y.o. pleasant, Caucasian female referred by Dr. Uvaldo Rising, Astrid Divine, FNP  for consultation & management of chronic diarrhea and abdominal cramps.  Patient reports that for the last 5 years, she has been experiencing at least 5-6 episodes of diarrhea, postprandial associated with severe lower abdominal cramps regularly without any weight loss, rectal bleeding, hematochezia.  She does report heartburn, regurgitation and nausea for which she is taking sublingual Zofran.  Ever since she delivered her baby who is now 56 months old, her lower GI symptoms have worsened.  She has not tried any medication for her lower GI symptoms.  She was told that she has IBS and she has been living with the symptoms.  The symptoms have significantly impacted her quality of life, especially dining out.  She does eat out frequently with her husband.  She does have history of anxiety and depression.  She has not taken Xanax in last 6 to 8 months for anxiety.  She is on Lamictal for depression.  NSAIDs: ibuprofen 662m to 8066mdaily for migraine for past 2weeks   Antiplts/Anticoagulants/Anti thrombotics: none  GI Procedures: none Patient denies family history of GI malignancy, inflammatory bowel disease, celiac disease  Past Medical History:  Diagnosis Date  . Anemia    with periods  . Ankle fracture    right  . Anxiety   . Asthma   . Back pain   . Depression   . Endometriosis   . GERD (gastroesophageal reflux disease)   . Headache   . Ovarian cyst   . PCOS (polycystic ovarian syndrome)   . Pneumonia    age [age]. PONV  (postoperative nausea and vomiting)    nausea after wisdom teeth extraction  . Vaginal Pap smear, abnormal    bx ok    Past Surgical History:  Procedure Laterality Date  . ROBOTIC ASSISTED DIAGNOSTIC LAPAROSCOPY N/A 02/18/2017   Procedure: ROBOTIC ASSISTED DIAGNOSTIC LAPAROSCOPY,EXCISION AND ABLATION OF ENDOMETRIOSIS,LYSIS OF ADHESIONS;  Surgeon: RiBrien FewMD;  [facility];  Service: Gynecology;  Laterality: N/A;  . WISDOM TOOTH EXTRACTION  2013    Current Outpatient Medications:  .  albuterol (PROVENTIL HFA;VENTOLIN HFA) 108 (90 Base) MCG/ACT inhaler, Inhale 2 puffs into the lungs every 4 (four) hours as needed for wheezing or shortness of breath., Disp: 1 Inhaler, Rfl: 1 .  ALPRAZolam (XANAX) 0.5 MG tablet, Take 0.5 mg by mouth at bedtime as needed for anxiety., Disp: , Rfl:  .  ibuprofen (ADVIL,MOTRIN) 800 MG tablet, Take 1 tablet (800 mg total) by mouth 3 (three) times daily., Disp: 30 tablet, Rfl: 0 .  lamoTRIgine (LAMICTAL) 100 MG tablet, lamotrigine 100 mg tablet  TAKE 1 TABLET BY MOUTH EVERYDAY, Disp: , Rfl:  .  meclizine (ANTIVERT) 25 MG tablet, Take 0.5-1 tablets (12.5-25 mg total) by mouth 3 (three) times daily as needed for dizziness., Disp: 20 tablet, Rfl: 0 .  ondansetron (ZOFRAN ODT) 8 MG disintegrating tablet, Take 1 tablet (8 mg total) by mouth every 8 (eight) hours as needed., Disp: 6  tablet, Rfl: 0 .  acetaminophen (TYLENOL) 500 MG tablet, Take 500 mg by mouth every 6 (six) hours as needed for headache., Disp: , Rfl:  .  amitriptyline (ELAVIL) 25 MG tablet, Take 1 tablet (25 mg total) by mouth at bedtime., Disp: 30 tablet, Rfl: 1 .  dicyclomine (BENTYL) 10 MG capsule, Take 1 capsule (10 mg total) by mouth 4 (four) times daily -  before meals and at bedtime for 30 doses., Disp: 30 capsule, Rfl: 0 .  famotidine (PEPCID) 40 MG tablet, Take 1 tablet (40 mg total) by mouth daily. (Patient not taking: Reported on 10/24/2018), Disp: 90 tablet, Rfl: 0 .  omeprazole  (PRILOSEC) 40 MG capsule, Take 1 capsule (40 mg total) by mouth daily before breakfast., Disp: 30 capsule, Rfl: 1 .  Prenat-Fe Carbonyl-FA-Omega 3 (ONE-A-DAY WOMENS PRENATAL 1 PO), Take by mouth., Disp: , Rfl:    Family History  Problem Relation Age of Onset  . Endometriosis Mother   . Bipolar disorder Mother   . Diabetes Mother        prediabetes  . Breast cancer Paternal Grandmother   . Cancer Paternal Grandmother   . Depression Paternal Grandmother   . Schizophrenia Father   . Depression Father   . Heart disease Neg Hx   . Colon cancer Neg Hx   . Ovarian cancer Neg Hx      Social History   Tobacco Use  . Smoking status: Former Smoker    Types: Cigarettes    Last attempt to quit: 12/20/2009    Years since quitting: 8.8  . Smokeless tobacco: Never Used  . Tobacco comment: age [age], quit  Substance Use Topics  . Alcohol use: No    Frequency: Never    Comment: occassionally  . Drug use: No    Allergies as of 10/24/2018 - Review Complete 10/24/2018  Allergen Reaction Noted  . Other  02/10/2017  . Latex Rash 05/01/2015    Review of Systems:    All systems reviewed and negative except where noted in HPI.   Physical Exam:  BP 109/80 (BP Location: Left Arm, Patient Position: Sitting, Cuff Size: Large)   Pulse 93   Resp 17   Ht 5' 1"  (1.549 m)   Wt 205 lb 12.8 oz (93.4 kg)   BMI 38.89 kg/m  No LMP recorded. (Menstrual status: Lactating).  General:   Alert,  Well-developed, well-nourished, pleasant and cooperative in NAD Head:  Normocephalic and atraumatic. Eyes:  Sclera clear, no icterus.   Conjunctiva pink. Ears:  Normal auditory acuity. Nose:  No deformity, discharge, or lesions. Mouth:  No deformity or lesions,oropharynx pink & moist. Neck:  Supple; no masses or thyromegaly. Lungs:  Respirations even and unlabored.  Clear throughout to auscultation.   No wheezes, crackles, or rhonchi. No acute distress. Heart:  Regular rate and rhythm; no murmurs, clicks,  rubs, or gallops. Abdomen:  Normal bowel sounds. Soft, non-tender and non-distended without masses, hepatosplenomegaly or hernias noted.  No guarding or rebound tenderness.   Rectal: Not performed Msk:  Symmetrical without gross deformities. Good, equal movement & strength bilaterally. Pulses:  Normal pulses noted. Extremities:  No clubbing or edema.  No cyanosis. Neurologic:  Alert and oriented x3;  grossly normal neurologically. Skin:  Intact without significant lesions or rashes. No jaundice. Psych:  Alert and cooperative. Normal mood and affect.  Imaging Studies: Reviewed  Assessment and Plan:   Janet Rich is a 23 y.o. Caucasian female with history of anxiety and depression, chronic  history of nonbloody diarrhea associated with abdominal cramps, bloating, regurgitation, heartburn and nausea  Regurgitation, heartburn and nausea Check H. pylori stool antigen and treat if positive Start omeprazole 40 mg daily If her symptoms are persistent, recommend EGD with biopsies  Abdominal cramps and nonbloody diarrhea Most likely diarrhea predominant IBS Trial of amitriptyline 25 mg at bedtime and increase if able to tolerate Bentyl 10 mg 3 times a day before meals and at bedtime for abdominal cramps Check CRP, ESR, total IgA, TTG IgA, fecal calprotectin levels   Follow up in 6 to 8 weeks   Cephas Darby, MD

## 2018-10-25 LAB — TISSUE TRANSGLUTAMINASE, IGA

## 2018-10-25 LAB — C-REACTIVE PROTEIN: CRP: 16 mg/L — AB (ref 0–10)

## 2018-10-25 LAB — IGA: IgA/Immunoglobulin A, Serum: 250 mg/dL (ref 87–352)

## 2018-10-25 LAB — SEDIMENTATION RATE: Sed Rate: 37 mm/hr — ABNORMAL HIGH (ref 0–32)

## 2018-10-31 DIAGNOSIS — M9900 Segmental and somatic dysfunction of head region: Secondary | ICD-10-CM | POA: Diagnosis not present

## 2018-10-31 DIAGNOSIS — M530 Cervicocranial syndrome: Secondary | ICD-10-CM | POA: Diagnosis not present

## 2018-10-31 DIAGNOSIS — M9901 Segmental and somatic dysfunction of cervical region: Secondary | ICD-10-CM | POA: Diagnosis not present

## 2018-10-31 DIAGNOSIS — R42 Dizziness and giddiness: Secondary | ICD-10-CM | POA: Diagnosis not present

## 2018-11-19 ENCOUNTER — Encounter: Payer: Self-pay | Admitting: Emergency Medicine

## 2018-11-19 ENCOUNTER — Other Ambulatory Visit: Payer: Self-pay

## 2018-11-19 ENCOUNTER — Ambulatory Visit
Admission: EM | Admit: 2018-11-19 | Discharge: 2018-11-19 | Disposition: A | Discharge status: Home / Self Care | Payer: BLUE CROSS/BLUE SHIELD | Attending: Emergency Medicine | Admitting: Emergency Medicine

## 2018-11-19 DIAGNOSIS — J4521 Mild intermittent asthma with (acute) exacerbation: Secondary | ICD-10-CM | POA: Diagnosis not present

## 2018-11-19 DIAGNOSIS — B9789 Other viral agents as the cause of diseases classified elsewhere: Secondary | ICD-10-CM

## 2018-11-19 DIAGNOSIS — J45901 Unspecified asthma with (acute) exacerbation: Secondary | ICD-10-CM

## 2018-11-19 DIAGNOSIS — J069 Acute upper respiratory infection, unspecified: Secondary | ICD-10-CM

## 2018-11-19 MED ORDER — HYDROCOD POLST-CPM POLST ER 10-8 MG/5ML PO SUER: 5.0000 mL | Freq: Two times a day (BID) | ORAL | 0 refills | Status: DC | PRN

## 2018-11-19 MED ORDER — IPRATROPIUM-ALBUTEROL 0.5-2.5 (3) MG/3ML IN SOLN
3.0000 mL | Freq: Once | RESPIRATORY_TRACT | Status: AC
  Administered 2018-11-19: 3 mL via RESPIRATORY_TRACT

## 2018-11-19 MED ORDER — PREDNISONE 50 MG PO TABS
60.0000 mg | ORAL_TABLET | Freq: Once | ORAL | Status: AC
  Administered 2018-11-19: 60 mg via ORAL

## 2018-11-19 MED ORDER — ALBUTEROL SULFATE HFA 108 (90 BASE) MCG/ACT IN AERS: 1.0000 | INHALATION_SPRAY | Freq: Four times a day (QID) | RESPIRATORY_TRACT | 0 refills | Status: DC | PRN

## 2018-11-19 MED ORDER — AZITHROMYCIN 250 MG PO TABS: 250.0000 mg | ORAL_TABLET | Freq: Every day | ORAL | 0 refills | Status: DC

## 2018-11-19 MED ORDER — PREDNISONE 10 MG (21) PO TBPK: ORAL_TABLET | ORAL | 0 refills | Status: DC

## 2018-11-19 MED ORDER — AEROCHAMBER PLUS MISC: 2 refills | Status: DC

## 2018-11-19 NOTE — ED Triage Notes (Signed)
Patient c/o cough and chest congestion since Monday.  Patient denies fevers.  

## 2018-11-19 NOTE — Discharge Instructions (Signed)
Not breast-feed if you are taking the Tussionex.  Do not take the Xanax if you take the Tussionex due to risk of respiratory depression.   6-day prednisone taper.  2 puffs from your albuterol inhaler with a spacer every 4 hours for the next 2 days, then every 6 hours for the next 2 days, then may use as needed.  Azithromycin to cover a possible pneumonia if not getting better with the albuterol, steroids, or if you start to have a fever above 100.4.

## 2018-11-19 NOTE — ED Provider Notes (Signed)
HPI  SUBJECTIVE:  Janet Rich is a 23 y.o. female who presents with dry cough for the past 5 days.  She states it started off as a URI and states that this largely got better, but she reports persistent chest tightness consistent with previous asthma exacerbations, wheezing, shortness of breath, dyspnea on exertion.  No fevers.  She reports a headache with coughing.  She reports an episode of posttussive emesis last night.  States that she is unable to sleep at night secondary to the cough.  No antibiotics in the past month.  No antipyretic in the past 4 to 6 hours.  She tried Alka-Seltzer cold and her albuterol inhaler 2 puffs twice a day with temporary improvement in her symptoms.  Normally uses the albuterol as needed.  Does not have a spacer with this.  She has also tried Zarby's cough syrup.  Symptoms are worse with talking.  Son currently has a URI.  Past medical history of asthma.  States that she was gets flares whenever she gets an upper respiratory infection.  She also has a history of pneumonia.   No history of diabetes, hypertension, CHF.  LMP: Last.  Denies the possibility being pregnant. She still occasionally breast-feeds.  PMD: Doren Custard, FNP  Past Medical History:  Diagnosis Date  . Anemia    with periods  . Ankle fracture    right  . Anxiety   . Asthma   . Back pain   . Depression   . Endometriosis   . GERD (gastroesophageal reflux disease)   . Headache   . Ovarian cyst   . PCOS (polycystic ovarian syndrome)   . Pneumonia    age 42  . PONV (postoperative nausea and vomiting)    nausea after wisdom teeth extraction  . Vaginal Pap smear, abnormal    bx ok    Past Surgical History:  Procedure Laterality Date  . ROBOTIC ASSISTED DIAGNOSTIC LAPAROSCOPY N/A 02/18/2017   Procedure: ROBOTIC ASSISTED DIAGNOSTIC LAPAROSCOPY,EXCISION AND ABLATION OF ENDOMETRIOSIS,LYSIS OF ADHESIONS;  Surgeon: Olivia Mackie, MD;  Location: [facility];  Service: Gynecology;  Laterality: N/A;  .  WISDOM TOOTH EXTRACTION  2013    Family History  Problem Relation Age of Onset  . Endometriosis Mother   . Bipolar disorder Mother   . Diabetes Mother        prediabetes  . Breast cancer Paternal Grandmother   . Cancer Paternal Grandmother   . Depression Paternal Grandmother   . Schizophrenia Father   . Depression Father   . Heart disease Neg Hx   . Colon cancer Neg Hx   . Ovarian cancer Neg Hx     Social History   Tobacco Use  . Smoking status: Former Smoker    Types: Cigarettes    Last attempt to quit: 12/20/2009    Years since quitting: 8.9  . Smokeless tobacco: Never Used  . Tobacco comment: age [age], quit  Substance Use Topics  . Alcohol use: No    Frequency: Never    Comment: occassionally  . Drug use: No    No current facility-administered medications for this encounter.   Current Outpatient Medications:  .  amitriptyline (ELAVIL) 25 MG tablet, Take 1 tablet (25 mg total) by mouth at bedtime., Disp: 30 tablet, Rfl: 1 .  famotidine (PEPCID) 40 MG tablet, Take 1 tablet (40 mg total) by mouth daily., Disp: 90 tablet, Rfl: 0 .  lamoTRIgine (LAMICTAL) 100 MG tablet, lamotrigine 100 mg tablet  TAKE 1  TABLET BY MOUTH EVERYDAY, Disp: , Rfl:  .  omeprazole (PRILOSEC) 40 MG capsule, Take 1 capsule (40 mg total) by mouth daily before breakfast., Disp: 30 capsule, Rfl: 1 .  acetaminophen (TYLENOL) 500 MG tablet, Take 500 mg by mouth every 6 (six) hours as needed for headache., Disp: , Rfl:  .  albuterol (PROVENTIL HFA;VENTOLIN HFA) 108 (90 Base) MCG/ACT inhaler, Inhale 1-2 puffs into the lungs every 6 (six) hours as needed for wheezing or shortness of breath., Disp: 1 Inhaler, Rfl: 0 .  azithromycin (ZITHROMAX) 250 MG tablet, Take 1 tablet (250 mg total) by mouth daily. 2 tabs po on day 1, 1 tab po on days 2-5, Disp: 6 tablet, Rfl: 0 .  chlorpheniramine-HYDROcodone (TUSSIONEX PENNKINETIC ER) 10-8 MG/5ML SUER, Take 5 mLs by mouth every 12 (twelve) hours as needed for cough.,  Disp: 60 mL, Rfl: 0 .  dicyclomine (BENTYL) 10 MG capsule, Take 1 capsule (10 mg total) by mouth 4 (four) times daily -  before meals and at bedtime for 30 doses., Disp: 30 capsule, Rfl: 0 .  ibuprofen (ADVIL,MOTRIN) 800 MG tablet, Take 1 tablet (800 mg total) by mouth 3 (three) times daily., Disp: 30 tablet, Rfl: 0 .  meclizine (ANTIVERT) 25 MG tablet, Take 0.5-1 tablets (12.5-25 mg total) by mouth 3 (three) times daily as needed for dizziness., Disp: 20 tablet, Rfl: 0 .  ondansetron (ZOFRAN ODT) 8 MG disintegrating tablet, Take 1 tablet (8 mg total) by mouth every 8 (eight) hours as needed., Disp: 6 tablet, Rfl: 0 .  predniSONE (STERAPRED UNI-PAK 21 TAB) 10 MG (21) TBPK tablet, Dispense one 6 day pack. Take as directed with food., Disp: 21 tablet, Rfl: 0 .  Prenat-Fe Carbonyl-FA-Omega 3 (ONE-A-DAY WOMENS PRENATAL 1 PO), Take by mouth., Disp: , Rfl:  .  Spacer/Aero-Holding Chambers (AEROCHAMBER PLUS) inhaler, Use as instructed, Disp: 1 each, Rfl: 2  Allergies  Allergen Reactions  . Other     Must have anti-emetic prior to being given any narcotics   . Latex Rash     ROS  As noted in HPI.   Physical Exam  BP 120/63 (BP Location: Right Arm)   Pulse 88   Temp 98.2 F (36.8 C) (Oral)   Resp 16   Ht 5\' 1"  (1.549 m)   Wt 97.5 kg   LMP 10/26/2018 (Approximate)   SpO2 100%   Breastfeeding Yes   BMI 40.62 kg/m   Constitutional: Well developed, well nourished, no acute distress Eyes:  EOMI, conjunctiva normal bilaterally HENT: Normocephalic, atraumatic,mucus membranes moist positive mucoid nasal congestion.  Normal oropharynx without postnasal drip, cobblestoning. Respiratory: Normal inspiratory effort, fair air movement, faint expiratory wheezing.  Positive anterior chest wall tenderness Cardiovascular: Normal rate regular rhythm, no murmurs, rubs, gallops GI: nondistended skin: No rash, skin intact Musculoskeletal: no deformities, calves symmetric, nontender, no  edema. Neurologic: Alert & oriented x 3, no focal neuro deficits Psychiatric: Speech and behavior appropriate   ED Course   Medications  ipratropium-albuterol (DUONEB) 0.5-2.5 (3) MG/3ML nebulizer solution 3 mL (3 mLs Nebulization Given 11/19/18 1504)  predniSONE (DELTASONE) tablet 60 mg (60 mg Oral Given 11/19/18 1511)    No orders of the defined types were placed in this encounter.   No results found for this or any previous visit (from the past 24 hour(s)). No results found.  ED Clinical Impression  Mild asthma with exacerbation, unspecified whether persistent  Viral URI with cough   ED Assessment/Plan  Presentation consistent with URI triggering off  an asthma exacerbation.  She states that the URI is getting better. will give DuoNeb and prednisone 60 mg.  Will reevaluate.  On Reevaluation, patient states that she feels completely better.  Chest tightness has resolved.  Improved air movement.  Lungs clear.  [place] Narcotic database reviewed for this patient, and feel that the risk/benefit ratio today is favorable for proceeding with a prescription for controlled substance.  Opiate prescriptions since 2018.  Last controlled substance prescription was for benzodiazepines in 07/2018  She states that she is not really breast-feeding, but that she can pump and waste if she decides to take the Tussionex.  Advised her to not take the Xanax if she is taking the Tussionex due to risk of respiratory depression.  Will send home with 6-day prednisone taper, an albuterol inhaler for her to take 2 puffs from every 4 hours for the next 2 days, then every 6 hours for the next 2 days, then may use as needed.  Also azithromycin.  Follow-up with her primary care physician as needed, to the ER if she gets worse.  Discussed  MDM, treatment plan, and plan for follow-up with patient. Discussed sn/sx that should prompt return to the ED. patient agrees with plan.   Meds ordered this encounter  Medications   . ipratropium-albuterol (DUONEB) 0.5-2.5 (3) MG/3ML nebulizer solution 3 mL  . predniSONE (DELTASONE) tablet 60 mg  . albuterol (PROVENTIL HFA;VENTOLIN HFA) 108 (90 Base) MCG/ACT inhaler    Sig: Inhale 1-2 puffs into the lungs every 6 (six) hours as needed for wheezing or shortness of breath.    Dispense:  1 Inhaler    Refill:  0  . Spacer/Aero-Holding Chambers (AEROCHAMBER PLUS) inhaler    Sig: Use as instructed    Dispense:  1 each    Refill:  2  . predniSONE (STERAPRED UNI-PAK 21 TAB) 10 MG (21) TBPK tablet    Sig: Dispense one 6 day pack. Take as directed with food.    Dispense:  21 tablet    Refill:  0  . chlorpheniramine-HYDROcodone (TUSSIONEX PENNKINETIC ER) 10-8 MG/5ML SUER    Sig: Take 5 mLs by mouth every 12 (twelve) hours as needed for cough.    Dispense:  60 mL    Refill:  0  . azithromycin (ZITHROMAX) 250 MG tablet    Sig: Take 1 tablet (250 mg total) by mouth daily. 2 tabs po on day 1, 1 tab po on days 2-5    Dispense:  6 tablet    Refill:  0    *This clinic note was created using Scientist, clinical (histocompatibility and immunogenetics). Therefore, there may be occasional mistakes despite careful proofreading.   ?   Domenick Gong, MD 11/19/18 [phone]

## 2018-12-05 DIAGNOSIS — M9900 Segmental and somatic dysfunction of head region: Secondary | ICD-10-CM | POA: Diagnosis not present

## 2018-12-05 DIAGNOSIS — R42 Dizziness and giddiness: Secondary | ICD-10-CM | POA: Diagnosis not present

## 2018-12-05 DIAGNOSIS — M530 Cervicocranial syndrome: Secondary | ICD-10-CM | POA: Diagnosis not present

## 2018-12-05 DIAGNOSIS — M9901 Segmental and somatic dysfunction of cervical region: Secondary | ICD-10-CM | POA: Diagnosis not present

## 2018-12-08 DIAGNOSIS — M25551 Pain in right hip: Secondary | ICD-10-CM | POA: Diagnosis not present

## 2018-12-08 DIAGNOSIS — M5416 Radiculopathy, lumbar region: Secondary | ICD-10-CM | POA: Diagnosis not present

## 2018-12-10 ENCOUNTER — Ambulatory Visit
Admission: EM | Admit: 2018-12-10 | Discharge: 2018-12-10 | Disposition: A | Discharge status: Home / Self Care | Payer: BLUE CROSS/BLUE SHIELD

## 2018-12-10 ENCOUNTER — Encounter: Payer: Self-pay | Admitting: Gynecology

## 2018-12-10 DIAGNOSIS — H6120 Impacted cerumen, unspecified ear: Secondary | ICD-10-CM

## 2018-12-10 NOTE — ED Triage Notes (Signed)
Patient c/o q-tip stuck in right ear.

## 2018-12-10 NOTE — ED Provider Notes (Signed)
[facility]    CSN: [id number] Arrival date & time: 12/10/18  1500     History   Chief Complaint No chief complaint on file.   HPI Janet Rich is a 23 y.o. female. Patient presents for suspected q-tip piece in right ear. She says she pulled a q-tip out of the ear prior to arrival and did not see the cotton on the end. She says her mother tried to get it out and she does not believe she did. Patient denies ear pain, hearing issues, ringing in ears, drainage or bleeding from ears. No other concerns today.  HPI  Past Medical History:  Diagnosis Date  . Anemia    with periods  . Ankle fracture    right  . Anxiety   . Asthma   . Back pain   . Depression   . Endometriosis   . GERD (gastroesophageal reflux disease)   . Headache   . Ovarian cyst   . PCOS (polycystic ovarian syndrome)   . Pneumonia    age 40  . PONV (postoperative nausea and vomiting)    nausea after wisdom teeth extraction  . Vaginal Pap smear, abnormal    bx ok    Patient Active Problem List   Diagnosis Date Noted  . Class 2 obesity due to excess calories without serious comorbidity with body mass index (BMI) of 38.0 to 38.9 in adult 09/02/2018  . Bipolar 2 disorder (HCC) 08/12/2018  . History of low birth weight 03/11/2018  . Vulvar atrophy 03/11/2018  . Dysmenorrhea 05/01/2015  . History of migraine headaches 05/01/2015  . Anxiety and depression 05/01/2015  . Family history of endometriosis in first degree relative 05/01/2015    Past Surgical History:  Procedure Laterality Date  . ROBOTIC ASSISTED DIAGNOSTIC LAPAROSCOPY N/A 02/18/2017   Procedure: ROBOTIC ASSISTED DIAGNOSTIC LAPAROSCOPY,EXCISION AND ABLATION OF ENDOMETRIOSIS,LYSIS OF ADHESIONS;  Surgeon: Olivia Mackie, MD;  Location: [facility];  Service: Gynecology;  Laterality: N/A;  . WISDOM TOOTH EXTRACTION  2013    OB History    Gravida  1   Para  1   Term  1   Preterm  0   AB  0   Living  1     SAB  0   TAB  0   Ectopic  0   Multiple  0   Live Births  1            Home Medications    Prior to Admission medications   Medication Sig Start Date End Date Taking? Authorizing Provider  acetaminophen (TYLENOL) 500 MG tablet Take 500 mg by mouth every 6 (six) hours as needed for headache.   Yes [provider]  albuterol (PROVENTIL HFA;VENTOLIN HFA) 108 (90 Base) MCG/ACT inhaler Inhale 1-2 puffs into the lungs every 6 (six) hours as needed for wheezing or shortness of breath. 11/19/18  Yes Domenick Gong, MD  famotidine (PEPCID) 40 MG tablet Take 1 tablet (40 mg total) by mouth daily. 08/25/18  Yes Doren Custard, FNP  lamoTRIgine (LAMICTAL) 100 MG tablet lamotrigine 100 mg tablet  TAKE 1 TABLET BY MOUTH EVERYDAY   Yes [provider]  Spacer/Aero-Holding Chambers (AEROCHAMBER PLUS) inhaler Use as instructed 11/19/18  Yes Domenick Gong, MD  amitriptyline (ELAVIL) 25 MG tablet Take 1 tablet (25 mg total) by mouth at bedtime. 10/24/18 11/23/18  Toney Reil, MD  azithromycin (ZITHROMAX) 250 MG tablet Take 1 tablet (250 mg total) by mouth daily. 2 tabs po on  day 1, 1 tab po on days 2-5 11/19/18   Domenick Gong, MD  chlorpheniramine-HYDROcodone [facility] ER) 10-8 MG/5ML SUER Take 5 mLs by mouth every 12 (twelve) hours as needed for cough. 11/19/18   Domenick Gong, MD  dicyclomine (BENTYL) 10 MG capsule Take 1 capsule (10 mg total) by mouth 4 (four) times daily -  before meals and at bedtime for 30 doses. 10/24/18 11/01/18  Toney Reil, MD  ibuprofen (ADVIL,MOTRIN) 800 MG tablet Take 1 tablet (800 mg total) by mouth 3 (three) times daily. 07/09/18   Payton Mccallum, MD  meclizine (ANTIVERT) 25 MG tablet Take 0.5-1 tablets (12.5-25 mg total) by mouth 3 (three) times daily as needed for dizziness. 08/25/18   Doren Custard, FNP  omeprazole (PRILOSEC) 40 MG capsule Take 1 capsule (40 mg total) by mouth daily before breakfast. 10/24/18 11/23/18  Toney Reil, MD    ondansetron (ZOFRAN ODT) 8 MG disintegrating tablet Take 1 tablet (8 mg total) by mouth every 8 (eight) hours as needed. 07/09/18   Payton Mccallum, MD  predniSONE (STERAPRED UNI-PAK 21 TAB) 10 MG (21) TBPK tablet Dispense one 6 day pack. Take as directed with food. 11/19/18   Domenick Gong, MD  Prenat-Fe Carbonyl-FA-Omega 3 (ONE-A-DAY WOMENS PRENATAL 1 PO) Take by mouth.    [provider]    Family History Family History  Problem Relation Age of Onset  . Endometriosis Mother   . Bipolar disorder Mother   . Diabetes Mother        prediabetes  . Breast cancer Paternal Grandmother   . Cancer Paternal Grandmother   . Depression Paternal Grandmother   . Schizophrenia Father   . Depression Father   . Heart disease Neg Hx   . Colon cancer Neg Hx   . Ovarian cancer Neg Hx     Social History Social History   Tobacco Use  . Smoking status: Former Smoker    Types: Cigarettes    Last attempt to quit: 12/20/2009    Years since quitting: 8.9  . Smokeless tobacco: Never Used  . Tobacco comment: age [age], quit  Substance Use Topics  . Alcohol use: No    Frequency: Never    Comment: occassionally  . Drug use: No     Allergies   Other and Latex   Review of Systems Review of Systems  Constitutional: Negative for fatigue and fever.  HENT: Negative for congestion, ear discharge, ear pain, rhinorrhea and sore throat.   Respiratory: Negative for cough.   Skin: Negative for rash and wound.  Neurological: Negative for weakness and headaches.     Physical Exam Triage Vital Signs ED Triage Vitals  Enc Vitals Group     BP 12/10/18 1547 126/89     Pulse Rate 12/10/18 1547 80     Resp 12/10/18 1547 16     Temp 12/10/18 1547 98.4 F (36.9 C)     Temp Source 12/10/18 1547 Oral     SpO2 12/10/18 1547 100 %     Weight 12/10/18 1542 215 lb (97.5 kg)     Height --      Head Circumference --      Peak Flow --      Pain Score 12/10/18 1542 0     Pain Loc --      Pain Edu?  --      Excl. in GC? --    No data found.  Updated Vital Signs BP 126/89 (BP Location: Left  Arm)   Pulse 80   Temp 98.4 F (36.9 C) (Oral)   Resp 16   Wt 215 lb (97.5 kg)   SpO2 100%   BMI 40.62 kg/m   Visual Acuity Right Eye Distance:   Left Eye Distance:   Bilateral Distance:    Right Eye Near:   Left Eye Near:    Bilateral Near:     Physical Exam Vitals signs and nursing note reviewed.  Constitutional:      General: She is not in acute distress.    Appearance: Normal appearance. She is obese. She is not ill-appearing.  HENT:     Head: Normocephalic and atraumatic.     Right Ear: Tympanic membrane, ear canal and external ear normal. There is no impacted cerumen.     Left Ear: Tympanic membrane, ear canal and external ear normal. There is no impacted cerumen.     Ears:     Comments: No foreign body visualized in right EAC. Able to fully visualize a normal TM, no abrasions or bleeding. Scant cerumen w/o impaction.    Nose: No congestion or rhinorrhea.     Mouth/Throat:     Mouth: Mucous membranes are moist.     Pharynx: Oropharynx is clear. No posterior oropharyngeal erythema.  Eyes:     General: No scleral icterus.       Right eye: No discharge.        Left eye: No discharge.     Conjunctiva/sclera: Conjunctivae normal.  Neck:     Musculoskeletal: Neck supple.  Cardiovascular:     Rate and Rhythm: Normal rate and regular rhythm.     Heart sounds: Normal heart sounds.  Pulmonary:     Effort: Pulmonary effort is normal. No respiratory distress.     Breath sounds: Normal breath sounds.  Lymphadenopathy:     Cervical: No cervical adenopathy.  Skin:    General: Skin is warm and dry.     Findings: No rash.  Neurological:     Mental Status: She is alert.  Psychiatric:        Mood and Affect: Mood normal.        Behavior: Behavior normal.        Thought Content: Thought content normal.      UC Treatments / Results  Labs (all labs ordered are listed, but  only abnormal results are displayed) Labs Reviewed - No data to display  EKG None  Radiology No results found.  Procedures Procedures (including critical care time)  Medications Ordered in UC Medications - No data to display  Initial Impression / Assessment and Plan / UC Course  I have reviewed the triage vital signs and the nursing notes.  Pertinent labs & imaging results that were available during my care of the patient were reviewed by me and considered in my medical decision making (see chart for details).    Final Clinical Impressions(s) / UC Diagnoses   Final diagnoses:  Complaint of wax in ear     Discharge Instructions     SUSPECTED FOREIGN BODY OF EAR: Upon examination by Athena Masse, PA-C and Dr. Dimitri Ped, your ear canal is clear and free of foreign body. It is not recommended to use q-tips in ears. Use OTC debrox to remove ear wax or go to clinic to have ears cleaned. Please return to clinic if needed.    ED Prescriptions    None     Controlled Substance Prescriptions [place] Controlled Substance Registry consulted? Not  Applicable   Shirlee Latch, PA-C 12/11/18 1156

## 2018-12-10 NOTE — Discharge Instructions (Addendum)
SUSPECTED FOREIGN BODY OF EAR: Upon examination by Athena Masse, PA-C and Dr. Dimitri Ped, your ear canal is clear and free of foreign body. It is not recommended to use q-tips in ears. Use OTC debrox to remove ear wax or go to clinic to have ears cleaned. Please return to clinic if needed.

## 2018-12-14 ENCOUNTER — Encounter: Payer: Self-pay | Admitting: Nurse Practitioner

## 2018-12-14 ENCOUNTER — Ambulatory Visit: Payer: Self-pay

## 2018-12-14 ENCOUNTER — Ambulatory Visit: Payer: BLUE CROSS/BLUE SHIELD | Admitting: Nurse Practitioner

## 2018-12-14 ENCOUNTER — Ambulatory Visit: Payer: Self-pay | Admitting: Nurse Practitioner

## 2018-12-14 VITALS — BP 112/76 | HR 95 | Temp 98.2°F | Resp 16 | Ht 61.0 in | Wt 208.6 lb

## 2018-12-14 DIAGNOSIS — R1012 Left upper quadrant pain: Secondary | ICD-10-CM | POA: Diagnosis not present

## 2018-12-14 DIAGNOSIS — R82998 Other abnormal findings in urine: Secondary | ICD-10-CM | POA: Diagnosis not present

## 2018-12-14 DIAGNOSIS — R1900 Intra-abdominal and pelvic swelling, mass and lump, unspecified site: Secondary | ICD-10-CM

## 2018-12-14 DIAGNOSIS — R110 Nausea: Secondary | ICD-10-CM | POA: Diagnosis not present

## 2018-12-14 DIAGNOSIS — Z13220 Encounter for screening for lipoid disorders: Secondary | ICD-10-CM | POA: Diagnosis not present

## 2018-12-14 LAB — POCT URINALYSIS DIPSTICK
Bilirubin, UA: NEGATIVE
Blood, UA: NEGATIVE
Glucose, UA: NEGATIVE
Ketones, UA: NEGATIVE
Nitrite, UA: NEGATIVE
Protein, UA: NEGATIVE
Spec Grav, UA: 1.01 (ref 1.010–1.025)
UROBILINOGEN UA: 0.2 U/dL
pH, UA: 7 (ref 5.0–8.0)

## 2018-12-14 LAB — POCT URINE PREGNANCY: Preg Test, Ur: NEGATIVE

## 2018-12-14 NOTE — Telephone Encounter (Signed)
Pt. Reports she started having abdominal pain yesterday. Upper abdomen at her ribs,more on the left.Radiates into her back.Denies nausea,vomiting,diarrhea or constipation.No fever. Appointment made for today.  Reason for Disposition . [1] MODERATE pain (e.g., interferes with normal activities) AND [2] pain comes and goes (cramps) AND [3] present > 24 hours  (Exception: pain with Vomiting or Diarrhea - see that Guideline)  Answer Assessment - Initial Assessment Questions 1. LOCATION: "Where does it hurt?"      Upper abdomen to the left under ribs 2. RADIATION: "Does the pain shoot anywhere else?" (e.g., chest, back)     Hurts into her back 3. ONSET: "When did the pain begin?" (e.g., minutes, hours or days ago)      Started yesterdat 4. SUDDEN: "Gradual or sudden onset?"     Sudden 5. PATTERN "Does the pain come and go, or is it constant?"    - If constant: "Is it getting better, staying the same, or worsening?"      (Note: Constant means the pain never goes away completely; most serious pain is constant and it progresses)     - If intermittent: "How long does it last?" "Do you have pain now?"     (Note: Intermittent means the pain goes away completely between bouts)     Constant 6. SEVERITY: "How bad is the pain?"  (e.g., Scale 1-10; mild, moderate, or severe)   - MILD (1-3): doesn't interfere with normal activities, abdomen soft and not tender to touch    - MODERATE (4-7): interferes with normal activities or awakens from sleep, tender to touch    - SEVERE (8-10): excruciating pain, doubled over, unable to do any normal activities      5 7. RECURRENT SYMPTOM: "Have you ever had this type of abdominal pain before?" If so, ask: "When was the last time?" and "What happened that time?"      No 8. CAUSE: "What do you think is causing the abdominal pain?"     Unsure 9. RELIEVING/AGGRAVATING FACTORS: "What makes it better or worse?" (e.g., movement, antacids, bowel movement)      No 10. OTHER  SYMPTOMS: "Has there been any vomiting, diarrhea, constipation, or urine problems?"       No 11. PREGNANCY: "Is there any chance you are pregnant?" "When was your last menstrual period?"       Jan. 5, 2020  Protocols used: ABDOMINAL PAIN - [facility]

## 2018-12-14 NOTE — Telephone Encounter (Signed)
Please review prior to today's appt. 

## 2018-12-14 NOTE — Patient Instructions (Signed)
-  If you have not heard anything from my staff in a week about any orders/referrals/studies from today, please contact us here to follow-up [phone]  Abdominal Pain, Adult Abdominal pain can be caused by many things. Often, abdominal pain is not serious and it gets better with no treatment or by being treated at home. However, sometimes abdominal pain is serious. Your health care provider will do a medical history and a physical exam to try to determine the cause of your abdominal pain. Follow these instructions at home:  Take over-the-counter and prescription medicines only as told by your health care provider. Do not take a laxative unless told by your health care provider.  Drink enough fluid to keep your urine clear or pale yellow.  Watch your condition for any changes.  Keep all follow-up visits as told by your health care provider. This is important. Contact a health care provider if:  Your abdominal pain changes or gets worse.  You are not hungry or you lose weight without trying.  You are constipated or have diarrhea for more than 2-3 days.  You have pain when you urinate or have a bowel movement.  Your abdominal pain wakes you up at night.  Your pain gets worse with meals, after eating, or with certain foods.  You are throwing up and cannot keep anything down.  You have a fever. Get help right away if:  Your pain does not go away as soon as your health care provider told you to expect.  You cannot stop throwing up.  Your pain is only in areas of the abdomen, such as the right side or the left lower portion of the abdomen.  You have bloody or black stools, or stools that look like tar.  You have severe pain, cramping, or bloating in your abdomen.  You have signs of dehydration, such as: ? Dark urine, very little urine, or no urine. ? Cracked lips. ? Dry mouth. ? Sunken eyes. ? Sleepiness. ? Weakness. This information is not intended to replace advice  given to you by your health care provider. Make sure you discuss any questions you have with your health care provider. Document Released: 07/15/2005 Document Revised: 04/24/2016 Document Reviewed: 03/18/2016 Elsevier Interactive Patient Education  2019 [employer].

## 2018-12-14 NOTE — Progress Notes (Signed)
Name: Janet Rich   MRN: [id number]    DOB: 1996-01-29   Date:12/14/2018       Progress Note  Subjective  Chief Complaint  Chief Complaint  Patient presents with  . Abdominal Pain    LUQ imtermittent for about a month    HPI  Patient endorses left upper abdominal- mild noticed about a month ago. Around this time she palpated a small round mass in the area, non-tender- she thought it was fat so she ignored it.  States yesterday the left-mid upper abdominal pain became more prominent, tender and feels like it is shooting through to back. Endorses mild nausea every day for the past 2 weeks; states she struggles with this on and off the past few years related to IBS. States she did have an episode of vomiting three days ago- none since then. Denies diarrhea or constipation. Endorses some burning in chest and throat when she eats spicy foods but has stopped since she has slowed down eating these foods. No changes in bowel consistency. Eating does not make LUQ pain better or worse. States LUQ is moderately tender and a constant pain with occasional intermittent sharp pain. Not worse with deep inspiration. Last BM was this morning. Took pregnancy test at home this morning- was negative. No recent infections.   Has been on steroids for the past 2 weeks, taking with food. Has been taking tylenol - under 2000mg  daily. Drinks alcohol ocassional- last time was valentines day. No family history of pancreatitis.     Patient Active Problem List   Diagnosis Date Noted  . Class 2 obesity due to excess calories without serious comorbidity with body mass index (BMI) of 38.0 to 38.9 in adult 09/02/2018  . Bipolar 2 disorder (HCC) 08/12/2018  . History of low birth weight 03/11/2018  . Vulvar atrophy 03/11/2018  . Dysmenorrhea 05/01/2015  . History of migraine headaches 05/01/2015  . Anxiety and depression 05/01/2015  . Family history of endometriosis in first degree relative 05/01/2015    Past Medical  History:  Diagnosis Date  . Anemia    with periods  . Ankle fracture    right  . Anxiety   . Asthma   . Back pain   . Depression   . Endometriosis   . GERD (gastroesophageal reflux disease)   . Headache   . Ovarian cyst   . PCOS (polycystic ovarian syndrome)   . Pneumonia    age 23  . PONV (postoperative nausea and vomiting)    nausea after wisdom teeth extraction  . Vaginal Pap smear, abnormal    bx ok    Past Surgical History:  Procedure Laterality Date  . ROBOTIC ASSISTED DIAGNOSTIC LAPAROSCOPY N/A 02/18/2017   Procedure: ROBOTIC ASSISTED DIAGNOSTIC LAPAROSCOPY,EXCISION AND ABLATION OF ENDOMETRIOSIS,LYSIS OF ADHESIONS;  Surgeon: Olivia Mackie, MD;  Location: [facility];  Service: Gynecology;  Laterality: N/A;  . WISDOM TOOTH EXTRACTION  2013    Social History   Tobacco Use  . Smoking status: Former Smoker    Types: Cigarettes    Last attempt to quit: 12/20/2009    Years since quitting: 8.9  . Smokeless tobacco: Never Used  . Tobacco comment: age [age], quit  Substance Use Topics  . Alcohol use: No    Frequency: Never    Comment: occassionally     Current Outpatient Medications:  .  lamoTRIgine (LAMICTAL) 100 MG tablet, lamotrigine 100 mg tablet  TAKE 1 TABLET BY MOUTH EVERYDAY, Disp: , Rfl:  .  acetaminophen (  TYLENOL) 500 MG tablet, Take 500 mg by mouth every 6 (six) hours as needed for headache., Disp: , Rfl:  .  albuterol (PROVENTIL HFA;VENTOLIN HFA) 108 (90 Base) MCG/ACT inhaler, Inhale 1-2 puffs into the lungs every 6 (six) hours as needed for wheezing or shortness of breath., Disp: 1 Inhaler, Rfl: 0 .  amitriptyline (ELAVIL) 25 MG tablet, Take 1 tablet (25 mg total) by mouth at bedtime., Disp: 30 tablet, Rfl: 1 .  dicyclomine (BENTYL) 10 MG capsule, Take 1 capsule (10 mg total) by mouth 4 (four) times daily -  before meals and at bedtime for 30 doses., Disp: 30 capsule, Rfl: 0 .  famotidine (PEPCID) 40 MG tablet, Take 1 tablet (40 mg total) by mouth daily., Disp:  90 tablet, Rfl: 0 .  ibuprofen (ADVIL,MOTRIN) 800 MG tablet, Take 1 tablet (800 mg total) by mouth 3 (three) times daily., Disp: 30 tablet, Rfl: 0 .  meclizine (ANTIVERT) 25 MG tablet, Take 0.5-1 tablets (12.5-25 mg total) by mouth 3 (three) times daily as needed for dizziness., Disp: 20 tablet, Rfl: 0 .  omeprazole (PRILOSEC) 40 MG capsule, Take 1 capsule (40 mg total) by mouth daily before breakfast., Disp: 30 capsule, Rfl: 1 .  ondansetron (ZOFRAN ODT) 8 MG disintegrating tablet, Take 1 tablet (8 mg total) by mouth every 8 (eight) hours as needed., Disp: 6 tablet, Rfl: 0 .  Prenat-Fe Carbonyl-FA-Omega 3 (ONE-A-DAY WOMENS PRENATAL 1 PO), Take by mouth., Disp: , Rfl:  .  Spacer/Aero-Holding Chambers (AEROCHAMBER PLUS) inhaler, Use as instructed, Disp: 1 each, Rfl: 2  Allergies  Allergen Reactions  . Other     Must have anti-emetic prior to being given any narcotics   . Latex Rash    Review of Systems  Constitutional: Negative for chills, fever, malaise/fatigue and weight loss.  Respiratory: Negative for shortness of breath.   Cardiovascular: Negative for chest pain and palpitations.  Gastrointestinal: Positive for abdominal pain, heartburn, nausea and vomiting. Negative for constipation and diarrhea.  Genitourinary: Negative for dysuria, flank pain, frequency, hematuria and urgency.  Skin: Negative for rash.  Neurological: Negative for dizziness and headaches.     No other specific complaints in a complete review of systems (except as listed in HPI above).  Objective  Vitals:   12/14/18 1128  BP: 112/76  Pulse: 95  Resp: 16  Temp: 98.2 F (36.8 C)  TempSrc: Oral  SpO2: 98%  Weight: 208 lb 9.6 oz (94.6 kg)  Height: 5\' 1"  (1.549 m)    Body mass index is 39.41 kg/m.  Nursing Note and Vital Signs reviewed.  Physical Exam Constitutional:      Appearance: She is well-developed.  HENT:     Head: Normocephalic and atraumatic.     Mouth/Throat:     Mouth: Mucous  membranes are moist.     Pharynx: Oropharynx is clear. No oropharyngeal exudate.  Cardiovascular:     Rate and Rhythm: Normal rate and regular rhythm.  Pulmonary:     Effort: Pulmonary effort is normal.     Breath sounds: Normal breath sounds.  Abdominal:     General: Abdomen is flat. Bowel sounds are increased. There is no distension.     Palpations: Abdomen is soft. There is mass (small round palpable mass in LUQ, mildly tender, mobile. ). There is no hepatomegaly.     Tenderness: There is abdominal tenderness in the epigastric area and left upper quadrant. There is no right CVA tenderness, left CVA tenderness or guarding. Negative signs include  Murphy's sign and McBurney's sign.  Skin:    General: Skin is warm and dry.     Findings: No erythema or rash.  Neurological:     General: No focal deficit present.     Mental Status: She is alert and oriented to person, place, and time.  Psychiatric:        Mood and Affect: Mood normal.        Behavior: Behavior normal.     No results found for this or any previous visit (from the past 48 hour(s)).  Assessment & Plan  1. Nausea Mild persistent nausea, patient states this is typical of her chronic nausea she experiences. Follow up appointment with GI tomorrow. Encouraged her to provide stool sample they had ordered as well to test for Hpylori. - POCT urine pregnancy - POCT Urinalysis Dipstick - CBC w/Diff/Platelet - COMPLETE METABOLIC PANEL WITH GFR - US Abdomen Complete; Future  2. Left upper quadrant pain Consider pancreatitis- no family history, no history of triglyceride, alcohol or drug ingestion. does not appear toxic, mild- moderate pain, nausea mild, no emesis since pain increased or hypotension, pain not worse with deep inspiration. Unable to palpate spleen- order US abdomen for further evaluation of mass, possible pancreatitis, referred pain from gallbladder, fatty liver. Negative for pregnancy. Trace leukocytes in urine- will  culture. - POCT urine pregnancy - Urine Culture  - POCT Urinalysis Dipstick - Lipid Profile - CBC w/Diff/Platelet - COMPLETE METABOLIC PANEL WITH GFR - LIPASE - US Abdomen Complete; Future  3. Screening for lipid disorders - Lipid Profile  4. Palpable abdominal mass - US Abdomen Complete; Future  -Red flags and when to present for emergency care or RTC including fever >101.58F, chest pain, shortness of breath, new/worsening/un-resolving symptoms, reviewed with patient at time of visit. Follow up and care instructions discussed and provided in AVS.

## 2018-12-15 ENCOUNTER — Telehealth: Payer: Self-pay | Admitting: Gastroenterology

## 2018-12-15 ENCOUNTER — Ambulatory Visit: Payer: Self-pay | Admitting: Gastroenterology

## 2018-12-15 DIAGNOSIS — K580 Irritable bowel syndrome with diarrhea: Secondary | ICD-10-CM

## 2018-12-15 LAB — COMPLETE METABOLIC PANEL WITH GFR
AG Ratio: 1.3 (calc) (ref 1.0–2.5)
ALKALINE PHOSPHATASE (APISO): 85 U/L (ref 31–125)
ALT: 15 U/L (ref 6–29)
AST: 12 U/L (ref 10–30)
Albumin: 4 g/dL (ref 3.6–5.1)
BUN: 11 mg/dL (ref 7–25)
CO2: 25 mmol/L (ref 20–32)
Calcium: 9.3 mg/dL (ref 8.6–10.2)
Chloride: 99 mmol/L (ref 98–110)
Creat: 0.62 mg/dL (ref 0.50–1.10)
GFR, Est African American: 146 mL/min/{1.73_m2} (ref >=60)
GFR, Est Non African American: 126 mL/min/{1.73_m2} (ref >=60)
GLUCOSE: 58 mg/dL — AB (ref 65–99)
Globulin: 3.2 g/dL (calc) (ref 1.9–3.7)
Potassium: 4.5 mmol/L (ref 3.5–5.3)
Sodium: 136 mmol/L (ref 135–146)
Total Bilirubin: 0.3 mg/dL (ref 0.2–1.2)
Total Protein: 7.2 g/dL (ref 6.1–8.1)

## 2018-12-15 LAB — URINALYSIS, ROUTINE W REFLEX MICROSCOPIC
Bilirubin Urine: NEGATIVE
Glucose, UA: NEGATIVE
Hgb urine dipstick: NEGATIVE
KETONES UR: NEGATIVE
Leukocytes,Ua: NEGATIVE
Nitrite: NEGATIVE
Protein, ur: NEGATIVE
SPECIFIC GRAVITY, URINE: 1.018 (ref 1.001–1.03)
pH: 7 (ref 5.0–8.0)

## 2018-12-15 LAB — TEST AUTHORIZATION

## 2018-12-15 LAB — CBC WITH DIFFERENTIAL/PLATELET
Absolute Monocytes: 735 cells/uL (ref 200–950)
BASOS ABS: 26 {cells}/uL (ref 0–200)
Basophils Relative: 0.2 %
Eosinophils Absolute: 142 cells/uL (ref 15–500)
Eosinophils Relative: 1.1 %
HCT: 41.6 % (ref 35.0–45.0)
Hemoglobin: 13.8 g/dL (ref 11.7–15.5)
Lymphs Abs: 3857 cells/uL (ref 850–3900)
MCH: 26.5 pg — ABNORMAL LOW (ref 27.0–33.0)
MCHC: 33.2 g/dL (ref 32.0–36.0)
MCV: 80 fL (ref 80.0–100.0)
MPV: 9.4 fL (ref 7.5–12.5)
Monocytes Relative: 5.7 %
NEUTROS PCT: 63.1 %
Neutro Abs: 8140 cells/uL — ABNORMAL HIGH (ref 1500–7800)
Platelets: 526 10*3/uL — ABNORMAL HIGH (ref 140–400)
RBC: 5.2 10*6/uL — ABNORMAL HIGH (ref 3.80–5.10)
RDW: 14.2 % (ref 11.0–15.0)
Total Lymphocyte: 29.9 %
WBC: 12.9 10*3/uL — ABNORMAL HIGH (ref 3.8–10.8)

## 2018-12-15 LAB — LIPID PANEL
Cholesterol: 213 mg/dL — ABNORMAL HIGH (ref <200)
HDL: 72 mg/dL (ref >=50)
LDL CHOLESTEROL (CALC): 118 mg/dL — AB
Non-HDL Cholesterol (Calc): 141 mg/dL (calc) — ABNORMAL HIGH (ref <130)
Total CHOL/HDL Ratio: 3 (calc) (ref <5.0)
Triglycerides: 124 mg/dL (ref <150)

## 2018-12-15 LAB — LIPASE: LIPASE: 25 U/L (ref 7–60)

## 2018-12-15 NOTE — Telephone Encounter (Signed)
Pt called to request a new stool kit she lost the one she was given , she states she will come by the office Monday to pick it up

## 2018-12-16 ENCOUNTER — Ambulatory Visit: Payer: BLUE CROSS/BLUE SHIELD

## 2018-12-16 LAB — URINE CULTURE
MICRO NUMBER:: 248110
SPECIMEN QUALITY: ADEQUATE

## 2018-12-19 ENCOUNTER — Ambulatory Visit
Admission: RE | Admit: 2018-12-19 | Discharge: 2018-12-19 | Disposition: A | Discharge status: Home / Self Care | Payer: BLUE CROSS/BLUE SHIELD | Source: Ambulatory Visit | Attending: Nurse Practitioner | Admitting: Nurse Practitioner

## 2018-12-19 DIAGNOSIS — R110 Nausea: Secondary | ICD-10-CM | POA: Insufficient documentation

## 2018-12-19 DIAGNOSIS — R1900 Intra-abdominal and pelvic swelling, mass and lump, unspecified site: Secondary | ICD-10-CM | POA: Diagnosis not present

## 2018-12-19 DIAGNOSIS — R1012 Left upper quadrant pain: Secondary | ICD-10-CM | POA: Insufficient documentation

## 2019-01-30 ENCOUNTER — Ambulatory Visit (INDEPENDENT_AMBULATORY_CARE_PROVIDER_SITE_OTHER): Payer: BLUE CROSS/BLUE SHIELD | Admitting: Family Medicine

## 2019-01-30 ENCOUNTER — Other Ambulatory Visit: Payer: Self-pay

## 2019-01-30 ENCOUNTER — Encounter: Payer: Self-pay | Admitting: Family Medicine

## 2019-01-30 ENCOUNTER — Telehealth: Payer: Self-pay | Admitting: Family Medicine

## 2019-01-30 DIAGNOSIS — J4521 Mild intermittent asthma with (acute) exacerbation: Secondary | ICD-10-CM | POA: Diagnosis not present

## 2019-01-30 MED ORDER — BUDESONIDE-FORMOTEROL FUMARATE 80-4.5 MCG/ACT IN AERO: 2.0000 | INHALATION_SPRAY | Freq: Two times a day (BID) | RESPIRATORY_TRACT | 3 refills | Status: DC

## 2019-01-30 MED ORDER — ALBUTEROL SULFATE (2.5 MG/3ML) 0.083% IN NEBU: 2.5000 mg | INHALATION_SOLUTION | Freq: Four times a day (QID) | RESPIRATORY_TRACT | 1 refills | Status: DC | PRN

## 2019-01-30 MED ORDER — PREDNISONE 10 MG PO TABS: ORAL_TABLET | ORAL | 0 refills | Status: DC

## 2019-01-30 NOTE — Progress Notes (Signed)
Name: Janet Rich   MRN: [id number]    DOB: 02-05-1996   Date:01/30/2019       Progress Note  Subjective  Chief Complaint  Chief Complaint  Patient presents with  . Asthma    I connected with  Marvel Plan  on 01/30/19 at 11:00 AM EDT by a video enabled telemedicine application and verified that I am speaking with the correct person using two identifiers.  I discussed the limitations of evaluation and management by telemedicine and the availability of in person appointments. The patient expressed understanding and agreed to proceed. Staff also discussed with the patient that there may be a patient responsible charge related to this service. Patient Location: Home Provider Location: Home Additional Individuals present: None  HPI  Pt presents with concern for asthma exacerbation for about 10 days.  Talking for long periods of time and ambulation are causing shortness of breath and fatigue, somewhat of a dry cough, mild sore throat for about 7 days.  She denies fevers, chest tightness, NVD, nasal congestion, ear pain/pressure.  She is not working. No recent sick contacts, no recent travel.  Has been using albuterol PRN and it's only helping slightly.   Patient Active Problem List   Diagnosis Date Noted  . Class 2 obesity due to excess calories without serious comorbidity with body mass index (BMI) of 38.0 to 38.9 in adult 09/02/2018  . Bipolar 2 disorder (HCC) 08/12/2018  . History of low birth weight 03/11/2018  . Vulvar atrophy 03/11/2018  . Dysmenorrhea 05/01/2015  . History of migraine headaches 05/01/2015  . Anxiety and depression 05/01/2015  . Family history of endometriosis in first degree relative 05/01/2015    Social History   Tobacco Use  . Smoking status: Former Smoker    Types: Cigarettes    Last attempt to quit: 12/20/2009    Years since quitting: 9.1  . Smokeless tobacco: Never Used  . Tobacco comment: age [age], quit  Substance Use Topics  . Alcohol use: No     Frequency: Never    Comment: occassionally     Current Outpatient Medications:  .  acetaminophen (TYLENOL) 500 MG tablet, Take 500 mg by mouth every 6 (six) hours as needed for headache., Disp: , Rfl:  .  albuterol (PROVENTIL HFA;VENTOLIN HFA) 108 (90 Base) MCG/ACT inhaler, Inhale 1-2 puffs into the lungs every 6 (six) hours as needed for wheezing or shortness of breath., Disp: 1 Inhaler, Rfl: 0 .  ALPRAZolam (XANAX) 0.25 MG tablet, Take 0.25 mg by mouth at bedtime as needed for anxiety., Disp: , Rfl:  .  famotidine (PEPCID) 40 MG tablet, Take 1 tablet (40 mg total) by mouth daily., Disp: 90 tablet, Rfl: 0 .  ibuprofen (ADVIL,MOTRIN) 800 MG tablet, Take 1 tablet (800 mg total) by mouth 3 (three) times daily., Disp: 30 tablet, Rfl: 0 .  lamoTRIgine (LAMICTAL) 100 MG tablet, lamotrigine 100 mg tablet  TAKE 1 TABLET BY MOUTH EVERYDAY, Disp: , Rfl:  .  meclizine (ANTIVERT) 25 MG tablet, Take 0.5-1 tablets (12.5-25 mg total) by mouth 3 (three) times daily as needed for dizziness., Disp: 20 tablet, Rfl: 0 .  Spacer/Aero-Holding Chambers (AEROCHAMBER PLUS) inhaler, Use as instructed, Disp: 1 each, Rfl: 2 .  amitriptyline (ELAVIL) 25 MG tablet, Take 1 tablet (25 mg total) by mouth at bedtime., Disp: 30 tablet, Rfl: 1 .  dicyclomine (BENTYL) 10 MG capsule, Take 1 capsule (10 mg total) by mouth 4 (four) times daily -  before meals and at  bedtime for 30 doses., Disp: 30 capsule, Rfl: 0 .  omeprazole (PRILOSEC) 40 MG capsule, Take 1 capsule (40 mg total) by mouth daily before breakfast., Disp: 30 capsule, Rfl: 1 .  ondansetron (ZOFRAN ODT) 8 MG disintegrating tablet, Take 1 tablet (8 mg total) by mouth every 8 (eight) hours as needed. (Patient not taking: Reported on 01/30/2019), Disp: 6 tablet, Rfl: 0 .  Prenat-Fe Carbonyl-FA-Omega 3 (ONE-A-DAY WOMENS PRENATAL 1 PO), Take by mouth., Disp: , Rfl:   Allergies  Allergen Reactions  . Other     Must have anti-emetic prior to being given any narcotics   .  Latex Rash    I personally reviewed active problem list, medication list, allergies with the patient/caregiver today.  ROS  Ten systems reviewed and is negative except as mentioned in HPI  Objective  Virtual encounter, vitals not obtained.  There is no height or weight on file to calculate BMI.  Nursing Note and Vital Signs reviewed.  Physical Exam  Constitutional: Patient appears well-developed and well-nourished. No distress.  HENT: Head: Normocephalic and atraumatic.  Neck: Normal range of motion. Pulmonary/Chest: Effort normal. No respiratory distress. Speaking in complete sentences Neurological: Pt is alert and oriented to person, place, and time. Coordination, speech are normal.  Psychiatric: Patient has a normal mood and affect. behavior is normal. Judgment and thought content normal.  No results found for this or any previous visit (from the past 72 hour(s)).  Assessment & Plan  1. Mild intermittent asthma with acute exacerbation - predniSONE (DELTASONE) 10 MG tablet; Day1:5tabs, Day2:4tabs, Day3:3tabs, Day4:2tabs, Day5:1tab  Dispense: 15 tablet; Refill: 0 - albuterol (PROVENTIL) (2.5 MG/3ML) 0.083% nebulizer solution; Take 3 mLs (2.5 mg total) by nebulization every 6 (six) hours as needed for wheezing or shortness of breath.  Dispense: 150 mL; Refill: 1 - DME Nebulizer machine - budesonide-formoterol (SYMBICORT) 80-4.5 MCG/ACT inhaler; Inhale 2 puffs into the lungs 2 (two) times daily.  Dispense: 1 Inhaler; Refill: 3  -Red flags and when to present for emergency care or RTC including fever >101.97F, chest pain, shortness of breath, new/worsening/un-resolving symptoms, reviewed with patient at time of visit. Follow up and care instructions discussed and provided in AVS. - I discussed the assessment and treatment plan with the patient. The patient was provided an opportunity to ask questions and all were answered. The patient agreed with the plan and demonstrated an  understanding of the instructions.  I provided 12 minutes of non-face-to-face time during this encounter.  Doren CustardEmily E Loneta Tamplin, FNP

## 2019-01-30 NOTE — Telephone Encounter (Signed)
Copied from CRM [id number]. Topic: Quick Communication - See Telephone Encounter >> Jan 30, 2019  2:26 PM Debroah Loop wrote: CRM for notification. See Telephone encounter for: 01/30/19. Patient says she was supposed to be prescribed a nebulizer machine but no script or order on file. Her pharmacy does not carry them either. Please adivse.

## 2019-01-30 NOTE — Telephone Encounter (Signed)
Patient is needing a RX for a nebulizer machine.  Currently the only local place she can get it from it [facility] store in [place], [place].  I informed patient that once the rx is done I would let her know so that she can come pick it up.

## 2019-01-30 NOTE — Patient Instructions (Addendum)
Symbicort Inhaler Coupon Link for [employer] -  [email or web address]

## 2019-01-31 DIAGNOSIS — J4531 Mild persistent asthma with (acute) exacerbation: Secondary | ICD-10-CM | POA: Diagnosis not present

## 2019-01-31 NOTE — Telephone Encounter (Signed)
Rx written and a voice mail was left for patient to pick up at the office and upon arrival a staff member will bring to her car.

## 2019-01-31 NOTE — Telephone Encounter (Signed)
I signed Rx yesterday, should be on the printer.  Elizabeth NP-C or Dr. Carlynn Purl can sign in absentia if signature is required, or Myriam Jacobson may call in verbal order.

## 2019-02-01 DIAGNOSIS — F4312 Post-traumatic stress disorder, chronic: Secondary | ICD-10-CM | POA: Diagnosis not present

## 2019-02-01 DIAGNOSIS — F331 Major depressive disorder, recurrent, moderate: Secondary | ICD-10-CM | POA: Diagnosis not present

## 2019-02-02 DIAGNOSIS — F3174 Bipolar disorder, in full remission, most recent episode manic: Secondary | ICD-10-CM | POA: Diagnosis not present

## 2019-02-02 DIAGNOSIS — F331 Major depressive disorder, recurrent, moderate: Secondary | ICD-10-CM | POA: Diagnosis not present

## 2019-02-02 DIAGNOSIS — F4312 Post-traumatic stress disorder, chronic: Secondary | ICD-10-CM | POA: Diagnosis not present

## 2019-02-02 DIAGNOSIS — F3176 Bipolar disorder, in full remission, most recent episode depressed: Secondary | ICD-10-CM | POA: Diagnosis not present

## 2019-02-07 DIAGNOSIS — R42 Dizziness and giddiness: Secondary | ICD-10-CM | POA: Diagnosis not present

## 2019-02-07 DIAGNOSIS — M530 Cervicocranial syndrome: Secondary | ICD-10-CM | POA: Diagnosis not present

## 2019-02-07 DIAGNOSIS — F4312 Post-traumatic stress disorder, chronic: Secondary | ICD-10-CM | POA: Diagnosis not present

## 2019-02-07 DIAGNOSIS — F331 Major depressive disorder, recurrent, moderate: Secondary | ICD-10-CM | POA: Diagnosis not present

## 2019-02-07 DIAGNOSIS — M9900 Segmental and somatic dysfunction of head region: Secondary | ICD-10-CM | POA: Diagnosis not present

## 2019-02-07 DIAGNOSIS — M9901 Segmental and somatic dysfunction of cervical region: Secondary | ICD-10-CM | POA: Diagnosis not present

## 2019-02-14 DIAGNOSIS — F331 Major depressive disorder, recurrent, moderate: Secondary | ICD-10-CM | POA: Diagnosis not present

## 2019-02-14 DIAGNOSIS — F4312 Post-traumatic stress disorder, chronic: Secondary | ICD-10-CM | POA: Diagnosis not present

## 2019-02-21 DIAGNOSIS — F331 Major depressive disorder, recurrent, moderate: Secondary | ICD-10-CM | POA: Diagnosis not present

## 2019-02-21 DIAGNOSIS — F4312 Post-traumatic stress disorder, chronic: Secondary | ICD-10-CM | POA: Diagnosis not present

## 2019-02-23 ENCOUNTER — Encounter: Payer: Self-pay | Admitting: Family Medicine

## 2019-02-23 ENCOUNTER — Ambulatory Visit (INDEPENDENT_AMBULATORY_CARE_PROVIDER_SITE_OTHER): Payer: BLUE CROSS/BLUE SHIELD | Admitting: Family Medicine

## 2019-02-23 ENCOUNTER — Telehealth: Payer: Self-pay | Admitting: Family Medicine

## 2019-02-23 ENCOUNTER — Other Ambulatory Visit: Payer: Self-pay

## 2019-02-23 DIAGNOSIS — B373 Candidiasis of vulva and vagina: Secondary | ICD-10-CM | POA: Diagnosis not present

## 2019-02-23 DIAGNOSIS — B3731 Acute candidiasis of vulva and vagina: Secondary | ICD-10-CM

## 2019-02-23 MED ORDER — FLUCONAZOLE 150 MG PO TABS: ORAL_TABLET | ORAL | 0 refills | Status: DC

## 2019-02-23 NOTE — Telephone Encounter (Signed)
Copied from CRM [id number]. Topic: General - Other >> Feb 23, 2019  8:07 AM Tamela Oddi wrote: Reason for CRM: Patient called to request a call from the nurse or doctor regarding a yeast infection.  Patient stated that the medication she has been taking is not working very well.  Please advise and call patient back at [phone]

## 2019-02-23 NOTE — Telephone Encounter (Signed)
Please schedule virtual visit - I can see her over lunch if needed.

## 2019-02-23 NOTE — Progress Notes (Signed)
Name: Janet Rich   MRN: [id number]    DOB: 06-09-1996   Date:02/23/2019       Progress Note  Subjective  Chief Complaint  Chief Complaint  Patient presents with  . Vaginitis    onset 6 days has tried [facility] but not helping. Itching, swelling and some discharge    I connected with  Janet Rich  on 02/23/19 at 11:20 AM EDT by a video enabled telemedicine application and verified that I am speaking with the correct person using two identifiers.  I discussed the limitations of evaluation and management by telemedicine and the availability of in person appointments. The patient expressed understanding and agreed to proceed. Staff also discussed with the patient that there may be a patient responsible charge related to this service. Patient Location: Home Provider Location: Home Additional Individuals present: None  HPI  Pt presents with concern for possible yeast infection.  Has had very significant itching, some swelling and minimal discharge.  No history of BV or trichomonas in the past; no new sexual partners.  No abdominal pain, fevers/chills. She tried the 3-day monistat and this only helped a minimal amount of improvement.  Patient Active Problem List   Diagnosis Date Noted  . Class 2 obesity due to excess calories without serious comorbidity with body mass index (BMI) of 38.0 to 38.9 in adult 09/02/2018  . Bipolar 2 disorder (HCC) 08/12/2018  . History of low birth weight 03/11/2018  . Vulvar atrophy 03/11/2018  . Dysmenorrhea 05/01/2015  . History of migraine headaches 05/01/2015  . Anxiety and depression 05/01/2015  . Family history of endometriosis in first degree relative 05/01/2015    Social History   Tobacco Use  . Smoking status: Former Smoker    Types: Cigarettes    Last attempt to quit: 12/20/2009    Years since quitting: 9.1  . Smokeless tobacco: Never Used  . Tobacco comment: age [age], quit  Substance Use Topics  . Alcohol use: No    Frequency: Never     Comment: occassionally     Current Outpatient Medications:  .  acetaminophen (TYLENOL) 500 MG tablet, Take 500 mg by mouth every 6 (six) hours as needed for headache., Disp: , Rfl:  .  albuterol (PROVENTIL HFA;VENTOLIN HFA) 108 (90 Base) MCG/ACT inhaler, Inhale 1-2 puffs into the lungs every 6 (six) hours as needed for wheezing or shortness of breath., Disp: 1 Inhaler, Rfl: 0 .  albuterol (PROVENTIL) (2.5 MG/3ML) 0.083% nebulizer solution, Take 3 mLs (2.5 mg total) by nebulization every 6 (six) hours as needed for wheezing or shortness of breath., Disp: 150 mL, Rfl: 1 .  ALPRAZolam (XANAX) 0.25 MG tablet, Take 0.25 mg by mouth at bedtime as needed for anxiety., Disp: , Rfl:  .  budesonide-formoterol (SYMBICORT) 80-4.5 MCG/ACT inhaler, Inhale 2 puffs into the lungs 2 (two) times daily., Disp: 1 Inhaler, Rfl: 3 .  famotidine (PEPCID) 40 MG tablet, Take 1 tablet (40 mg total) by mouth daily., Disp: 90 tablet, Rfl: 0 .  ibuprofen (ADVIL,MOTRIN) 800 MG tablet, Take 1 tablet (800 mg total) by mouth 3 (three) times daily., Disp: 30 tablet, Rfl: 0 .  lamoTRIgine (LAMICTAL) 100 MG tablet, 150 mg daily. , Disp: , Rfl:  .  amitriptyline (ELAVIL) 25 MG tablet, Take 1 tablet (25 mg total) by mouth at bedtime., Disp: 30 tablet, Rfl: 1 .  dicyclomine (BENTYL) 10 MG capsule, Take 1 capsule (10 mg total) by mouth 4 (four) times daily -  before meals and  at bedtime for 30 doses., Disp: 30 capsule, Rfl: 0 .  meclizine (ANTIVERT) 25 MG tablet, Take 0.5-1 tablets (12.5-25 mg total) by mouth 3 (three) times daily as needed for dizziness., Disp: 20 tablet, Rfl: 0 .  omeprazole (PRILOSEC) 40 MG capsule, Take 1 capsule (40 mg total) by mouth daily before breakfast., Disp: 30 capsule, Rfl: 1 .  predniSONE (DELTASONE) 10 MG tablet, Day1:5tabs, Day2:4tabs, Day3:3tabs, Day4:2tabs, Day5:1tab (Patient not taking: Reported on 02/23/2019), Disp: 15 tablet, Rfl: 0 .  Prenat-Fe Carbonyl-FA-Omega 3 (ONE-A-DAY WOMENS PRENATAL 1 PO),  Take by mouth., Disp: , Rfl:  .  Spacer/Aero-Holding Chambers (AEROCHAMBER PLUS) inhaler, Use as instructed, Disp: 1 each, Rfl: 2  Allergies  Allergen Reactions  . Other     Must have anti-emetic prior to being given any narcotics   . Latex Rash    I personally reviewed active problem list, medication list, allergies with the patient/caregiver today.  ROS  Ten systems reviewed and is negative except as mentioned in HPI  Objective  Virtual encounter, vitals not obtained.  There is no height or weight on file to calculate BMI.  Nursing Note and Vital Signs reviewed.  Physical Exam  Constitutional: Patient appears well-developed and well-nourished. No distress.  HENT: Head: Normocephalic and atraumatic.  Neck: Normal range of motion. Pulmonary/Chest: Effort normal. No respiratory distress. Speaking in complete sentences Neurological: Pt is alert and oriented to person, place, and time. Coordination, speech and gait are normal.  Psychiatric: Patient has a normal mood and affect. behavior is normal. Judgment and thought content normal.  No results found for this or any previous visit (from the past 72 hour(s)).  Assessment & Plan  1. Vaginal candidiasis - If not improving with 2 diflucan, will need to come for in-person visit for vaginal swab and pelvic examination. Return precautions discussed. - fluconazole (DIFLUCAN) 150 MG tablet; Take 1 tablet once; may repeat in 72 hours if symptoms not resolving.  Dispense: 2 tablet; Refill: 0   -Red flags and when to present for emergency care or RTC including fever >101.65F, chest pain, shortness of breath, new/worsening/un-resolving symptoms, reviewed with patient at time of visit. Follow up and care instructions discussed and provided in AVS. - I discussed the assessment and treatment plan with the patient. The patient was provided an opportunity to ask questions and all were answered. The patient agreed with the plan and demonstrated  an understanding of the instructions.  I provided 16 minutes of non-face-to-face time during this encounter.  Doren Custard, FNP

## 2019-02-23 NOTE — Telephone Encounter (Signed)
Scheduled for 11:20 today.

## 2019-02-28 DIAGNOSIS — F4312 Post-traumatic stress disorder, chronic: Secondary | ICD-10-CM | POA: Diagnosis not present

## 2019-02-28 DIAGNOSIS — F331 Major depressive disorder, recurrent, moderate: Secondary | ICD-10-CM | POA: Diagnosis not present

## 2019-03-06 ENCOUNTER — Other Ambulatory Visit: Payer: Self-pay

## 2019-03-06 ENCOUNTER — Ambulatory Visit: Payer: 59 | Admitting: Family Medicine

## 2019-03-06 ENCOUNTER — Encounter: Payer: Self-pay | Admitting: Emergency Medicine

## 2019-03-06 ENCOUNTER — Ambulatory Visit
Admission: EM | Admit: 2019-03-06 | Discharge: 2019-03-06 | Disposition: A | Discharge status: Home / Self Care | Payer: BLUE CROSS/BLUE SHIELD | Attending: Family Medicine | Admitting: Family Medicine

## 2019-03-06 DIAGNOSIS — Z23 Encounter for immunization: Secondary | ICD-10-CM | POA: Diagnosis not present

## 2019-03-06 DIAGNOSIS — S61012A Laceration without foreign body of left thumb without damage to nail, initial encounter: Secondary | ICD-10-CM | POA: Diagnosis not present

## 2019-03-06 MED ORDER — TETANUS-DIPHTH-ACELL PERTUSSIS 5-2.5-18.5 LF-MCG/0.5 IM SUSP
0.5000 mL | Freq: Once | INTRAMUSCULAR | Status: AC
  Administered 2019-03-06: 0.5 mL via INTRAMUSCULAR

## 2019-03-06 NOTE — Discharge Instructions (Addendum)
Keep wound clean as much as possible and watch for any signs of infection; follow up if signs of infection develop Over the counter antibiotic ointment Protect with finger splint

## 2019-03-06 NOTE — ED Triage Notes (Signed)
Pt has laceration on her left thumb. She was cutting lettuce and cut her finger with the kitchen knife. Partial nail involvement. Occurred last night.  Pt states that she had her last tetanus in 2019.

## 2019-03-07 ENCOUNTER — Ambulatory Visit (INDEPENDENT_AMBULATORY_CARE_PROVIDER_SITE_OTHER): Payer: BLUE CROSS/BLUE SHIELD | Admitting: Family Medicine

## 2019-03-07 ENCOUNTER — Encounter: Payer: Self-pay | Admitting: Family Medicine

## 2019-03-07 DIAGNOSIS — E6609 Other obesity due to excess calories: Secondary | ICD-10-CM | POA: Diagnosis not present

## 2019-03-07 DIAGNOSIS — J4520 Mild intermittent asthma, uncomplicated: Secondary | ICD-10-CM

## 2019-03-07 DIAGNOSIS — J4540 Moderate persistent asthma, uncomplicated: Secondary | ICD-10-CM | POA: Insufficient documentation

## 2019-03-07 DIAGNOSIS — R040 Epistaxis: Secondary | ICD-10-CM | POA: Insufficient documentation

## 2019-03-07 DIAGNOSIS — R42 Dizziness and giddiness: Secondary | ICD-10-CM | POA: Diagnosis not present

## 2019-03-07 DIAGNOSIS — F3181 Bipolar II disorder: Secondary | ICD-10-CM

## 2019-03-07 DIAGNOSIS — K219 Gastro-esophageal reflux disease without esophagitis: Secondary | ICD-10-CM | POA: Insufficient documentation

## 2019-03-07 DIAGNOSIS — Z6838 Body mass index (BMI) 38.0-38.9, adult: Secondary | ICD-10-CM

## 2019-03-07 DIAGNOSIS — K580 Irritable bowel syndrome with diarrhea: Secondary | ICD-10-CM | POA: Insufficient documentation

## 2019-03-07 DIAGNOSIS — R0982 Postnasal drip: Secondary | ICD-10-CM | POA: Insufficient documentation

## 2019-03-07 MED ORDER — LEVOCETIRIZINE DIHYDROCHLORIDE 5 MG PO TABS: 5.0000 mg | ORAL_TABLET | Freq: Every evening | ORAL | 1 refills | Status: DC

## 2019-03-07 MED ORDER — AYR SALINE NASAL NA GEL: 1.0000 "application " | Freq: Four times a day (QID) | NASAL | 6 refills | Status: DC | PRN

## 2019-03-07 MED ORDER — MECLIZINE HCL 25 MG PO TABS: 12.5000 mg | ORAL_TABLET | Freq: Three times a day (TID) | ORAL | 0 refills | Status: DC | PRN

## 2019-03-07 MED ORDER — TRIAMCINOLONE ACETONIDE 55 MCG/ACT NA AERO: 2.0000 | INHALATION_SPRAY | Freq: Every day | NASAL | 12 refills | Status: DC

## 2019-03-07 NOTE — Patient Instructions (Addendum)
Take Ayr Saline Gel each night Take Nasacort each morning Take Xyzal each night

## 2019-03-07 NOTE — Progress Notes (Signed)
Name: Janet Rich   MRN: [id number]    DOB: [birth year]   Date:03/07/2019       Progress Note  Subjective  Chief Complaint  Chief Complaint  Patient presents with   Follow-up   Sore Throat    talk about tonsils and stones   Medication Refill    I connected with  Marvel Plan  on 03/07/19 at 10:00 AM EDT by a video enabled telemedicine application and verified that I am speaking with the correct person using two identifiers.  I discussed the limitations of evaluation and management by telemedicine and the availability of in person appointments. The patient expressed understanding and agreed to proceed. Staff also discussed with the patient that there may be a patient responsible charge related to this service. Patient Location: Home Provider Location: Home Additional Individuals present: None  HPI  Vertigo/Epistaxis/PND: She takes meclizine PRN for vertigo and motion sickness.  This symptoms typically occur when she rides in a car or her right ear has any issues.  She notes ongoing tonsil soreness and feeling of mucus in the back of her throat causing her to feel like she has to constantly clear her throat.  She also notes epistaxis once every few weeks that lasts for several minutes and does have some clotting with this.  Laceration: Seen in ER yesterday for laceration of the LEFT thumb.  Did not have to get stitches but did have her TDAP updated.  She states is doing well now, bleeding has remained controlled, no redness drainage.   Bipolar II and Anxiety: Diagnosed for several years; taking Lamictal and feels stable on this medication.  She is Rx'd Xanax PRN only.  Seeing Dr. Evelene Croon in [place].   IBS/GERD: Seeing Dr. Allegra Lai - she was given amytriptyline but is not taking it; she was also given bentyl and omeprazole not taking either.  She has always had Reflux, had stomach ulcers as a child. Denies difficulty swallowing. Needs to provide stool sample and have labs done with Dr.  Allegra Lai.  No blood in stool, no dark and tarry stools.  Ongoing abdominal pain and diarrhea after meals - pain resolves after BM.  Asthma: She is still having nighttime symptoms, has shortness of breath and cough.  She has been non-compliant with Symbicort - education is provided and she will take BID going forward.    Patient Active Problem List   Diagnosis Date Noted   Class 2 obesity due to excess calories without serious comorbidity with body mass index (BMI) of 38.0 to 38.9 in adult 09/02/2018   Bipolar 2 disorder (HCC) 08/12/2018   History of low birth weight 03/11/2018   Vulvar atrophy 03/11/2018   Dysmenorrhea 05/01/2015   History of migraine headaches 05/01/2015   Anxiety and depression 05/01/2015   Family history of endometriosis in first degree relative 05/01/2015    Past Surgical History:  Procedure Laterality Date   ROBOTIC ASSISTED DIAGNOSTIC LAPAROSCOPY N/A 02/18/2017   Procedure: ROBOTIC ASSISTED DIAGNOSTIC LAPAROSCOPY,EXCISION AND ABLATION OF ENDOMETRIOSIS,LYSIS OF ADHESIONS;  Surgeon: Olivia Mackie, MD;  Location: [facility];  Service: Gynecology;  Laterality: N/A;   WISDOM TOOTH EXTRACTION  2013    Family History  Problem Relation Age of Onset   Endometriosis Mother    Bipolar disorder Mother    Diabetes Mother        prediabetes   Breast cancer Paternal Grandmother    Cancer Paternal Grandmother    Depression Paternal Grandmother    Schizophrenia Father  Depression Father    Heart disease Neg Hx    Colon cancer Neg Hx    Ovarian cancer Neg Hx     Social History   Socioeconomic History   Marital status: Married    Spouse name: Renae Fickle   Number of children: 1   Years of education: Not on file   Highest education level: Not on file  Occupational History   Occupation: Clinical biochemist strain: Not hard at all   Food insecurity:    Worry: Never true    Inability: Never true   Transportation needs:     Medical: No    Non-medical: No  Tobacco Use   Smoking status: Former Smoker    Types: Cigarettes    Last attempt to quit: 12/20/2009    Years since quitting: 9.2   Smokeless tobacco: Never Used   Tobacco comment: age [age], quit  Substance and Sexual Activity   Alcohol use: No    Frequency: Never    Comment: occassionally   Drug use: No   Sexual activity: Yes    Partners: Female, Female    Birth control/protection: None    Comment: last sex mid Jan 2019  Lifestyle   Physical activity:    Days per week: 0 days    Minutes per session: 0 min   Stress: Not at all  Relationships   Social connections:    Talks on phone: More than three times a week    Gets together: More than three times a week    Attends religious service: Never    Active member of club or organization: No    Attends meetings of clubs or organizations: Never    Relationship status: Married   Intimate partner violence:    Fear of current or ex partner: No    Emotionally abused: No    Physically abused: No    Forced sexual activity: No  Other Topics Concern   Not on file  Social History Narrative   Not on file     Current Outpatient Medications:    acetaminophen (TYLENOL) 500 MG tablet, Take 500 mg by mouth every 6 (six) hours as needed for headache., Disp: , Rfl:    albuterol (PROVENTIL HFA;VENTOLIN HFA) 108 (90 Base) MCG/ACT inhaler, Inhale 1-2 puffs into the lungs every 6 (six) hours as needed for wheezing or shortness of breath., Disp: 1 Inhaler, Rfl: 0   albuterol (PROVENTIL) (2.5 MG/3ML) 0.083% nebulizer solution, Take 3 mLs (2.5 mg total) by nebulization every 6 (six) hours as needed for wheezing or shortness of breath., Disp: 150 mL, Rfl: 1   ALPRAZolam (XANAX) 0.25 MG tablet, Take 0.25 mg by mouth at bedtime as needed for anxiety., Disp: , Rfl:    budesonide-formoterol (SYMBICORT) 80-4.5 MCG/ACT inhaler, Inhale 2 puffs into the lungs 2 (two) times daily., Disp: 1 Inhaler, Rfl: 3    ibuprofen (ADVIL,MOTRIN) 800 MG tablet, Take 1 tablet (800 mg total) by mouth 3 (three) times daily., Disp: 30 tablet, Rfl: 0   lamoTRIgine (LAMICTAL) 100 MG tablet, 150 mg daily. , Disp: , Rfl:    amitriptyline (ELAVIL) 25 MG tablet, Take 1 tablet (25 mg total) by mouth at bedtime., Disp: 30 tablet, Rfl: 1   dicyclomine (BENTYL) 10 MG capsule, Take 1 capsule (10 mg total) by mouth 4 (four) times daily -  before meals and at bedtime for 30 doses., Disp: 30 capsule, Rfl: 0   famotidine (PEPCID) 40 MG tablet, Take 1  tablet (40 mg total) by mouth daily. (Patient not taking: Reported on 03/07/2019), Disp: 90 tablet, Rfl: 0   fluconazole (DIFLUCAN) 150 MG tablet, Take 1 tablet once; may repeat in 72 hours if symptoms not resolving. (Patient not taking: Reported on 03/07/2019), Disp: 2 tablet, Rfl: 0   meclizine (ANTIVERT) 25 MG tablet, Take 0.5-1 tablets (12.5-25 mg total) by mouth 3 (three) times daily as needed for dizziness. (Patient not taking: Reported on 03/07/2019), Disp: 20 tablet, Rfl: 0   omeprazole (PRILOSEC) 40 MG capsule, Take 1 capsule (40 mg total) by mouth daily before breakfast., Disp: 30 capsule, Rfl: 1   predniSONE (DELTASONE) 10 MG tablet, Day1:5tabs, Day2:4tabs, Day3:3tabs, Day4:2tabs, Day5:1tab (Patient not taking: Reported on 02/23/2019), Disp: 15 tablet, Rfl: 0   Prenat-Fe Carbonyl-FA-Omega 3 (ONE-A-DAY WOMENS PRENATAL 1 PO), Take by mouth., Disp: , Rfl:    Spacer/Aero-Holding Chambers (AEROCHAMBER PLUS) inhaler, Use as instructed, Disp: 1 each, Rfl: 2  Allergies  Allergen Reactions   Other     Must have anti-emetic prior to being given any narcotics    Latex Rash    I personally reviewed active problem list, medication list, allergies, notes from last encounter, lab results with the patient/caregiver today.   ROS Ten systems reviewed and is negative except as mentioned in HPI  Objective  Virtual encounter, vitals not obtained.  There is no height or weight on  file to calculate BMI.  Physical Exam  Constitutional: Patient appears well-developed and well-nourished. No distress.  HENT: Head: Normocephalic and atraumatic.  Neck: Normal range of motion. Pulmonary/Chest: Effort normal. No respiratory distress. Speaking in complete sentences Neurological: Pt is alert and oriented to person, place, and time. Coordination, speech and gait are normal.  Psychiatric: Patient has a normal mood and affect. behavior is normal. Judgment and thought content normal. Skin: LEFT thumb has dressing and I am UTA.  No results found for this or any previous visit (from the past 72 hour(s)).  PHQ2/9: [facility] 03/07/2019 02/23/2019 01/30/2019 12/14/2018 09/02/2018  Decreased Interest 0 0 1 0 1  Down, Depressed, Hopeless 1 3 1 1 1   PHQ - 2 Score 1 3 2 1 2   Altered sleeping 0 0 0 0 0  Tired, decreased energy 0 0 1 1 1   Change in appetite 0 0 1 0 1  Feeling bad or failure about yourself  1 0 0 0 1  Trouble concentrating 0 0 0 0 0  Moving slowly or fidgety/restless 0 0 0 0 0  Suicidal thoughts 0 0 0 0 0  PHQ-9 Score 2 3 4 2 5   Difficult doing work/chores Not difficult at all Somewhat difficult Not difficult at all Not difficult at all Not difficult at all   PHQ-2/9 Result is negative.    Fall Risk: Fall Risk  03/07/2019 02/23/2019 01/30/2019 12/14/2018 08/25/2018  Falls in the past year? 0 0 1 1 0  Number falls in past yr: 0 0 1 0 -  Injury with Fall? 0 0 1 1 -  Risk for fall due to : - - History of fall(s) History of fall(s) -  Follow up - - Falls evaluation completed - -    Assessment & Plan  1. Dizziness - meclizine (ANTIVERT) 25 MG tablet; Take 0.5-1 tablets (12.5-25 mg total) by mouth 3 (three) times daily as needed for dizziness.  Dispense: 20 tablet; Refill: 0  2. Bipolar 2 disorder (HCC) - Stable with psychiatry, doing well on current medicaitons  3. Epistaxis -  Humidifier in the room at night - levocetirizine (XYZAL) 5 MG tablet; Take 1  tablet (5 mg total) by mouth every evening.  Dispense: 90 tablet; Refill: 1 - triamcinolone (NASACORT) 55 MCG/ACT AERO nasal inhaler; Place 2 sprays into the nose daily.  Dispense: 1 Inhaler; Refill: 12 - saline (AYR) GEL; Place 1 application into the nose every 6 (six) hours as needed.  Dispense: 14.1 g; Refill: 6  4. Class 2 obesity due to excess calories without serious comorbidity with body mass index (BMI) of 38.0 to 38.9 in adult - Discussed importance of 150 minutes of physical activity weekly, eat two servings of fish weekly, eat one serving of tree nuts ( cashews, pistachios, pecans, almonds.Marland Kitchen) every other day, eat 6 servings of fruit/vegetables daily and drink plenty of water and avoid sweet beverages.   5. Post-nasal drainage - levocetirizine (XYZAL) 5 MG tablet; Take 1 tablet (5 mg total) by mouth every evening.  Dispense: 90 tablet; Refill: 1 - triamcinolone (NASACORT) 55 MCG/ACT AERO nasal inhaler; Place 2 sprays into the nose daily.  Dispense: 1 Inhaler; Refill: 12  6. Mild intermittent asthma without complication - Symbicort BID, albuterol PRN  7. Gastroesophageal reflux disease without esophagitis - Seeing GI, recommend she complete evaluation with them  8. Irritable bowel syndrome with diarrhea - Seeing GI, recommend she complete evaluation with them  I discussed the assessment and treatment plan with the patient. The patient was provided an opportunity to ask questions and all were answered. The patient agreed with the plan and demonstrated an understanding of the instructions.  The patient was advised to call back or seek an in-person evaluation if the symptoms worsen or if the condition fails to improve as anticipated.  I provided 27 minutes of non-face-to-face time during this encounter.

## 2019-03-19 ENCOUNTER — Telehealth: Payer: BLUE CROSS/BLUE SHIELD | Admitting: Family

## 2019-03-19 DIAGNOSIS — Z20822 Contact with and (suspected) exposure to covid-19: Secondary | ICD-10-CM

## 2019-03-19 MED ORDER — BENZONATATE 100 MG PO CAPS: 100.0000 mg | ORAL_CAPSULE | Freq: Three times a day (TID) | ORAL | 0 refills | Status: DC | PRN

## 2019-03-19 NOTE — Progress Notes (Signed)
E-Visit for Corona Virus Screening  Based on your current symptoms, you may very well have the virus, however your symptoms are mild. Currently, not all patients are being tested. If the symptoms are mild and there is not a known exposure, performing the test is not indicated.  You have been enrolled in MyChart Home Monitoring for COVID-19. Daily you will receive a questionnaire within the MyChart website. Our COVID-19 response team will be monitoring your responses daily.  Approximately 5 minutes was spent documenting and reviewing patient's chart.    Coronavirus disease 2019 (COVID-19) is a respiratory illness that can spread from person to person. The virus that causes COVID-19 is a new virus that was first identified in the country of [place] but is now found in multiple other countries and has spread to the [place].  Symptoms associated with the virus are mild to severe fever, cough, and shortness of breath. There is currently no vaccine to protect against COVID-19, and there is no specific antiviral treatment for the virus.   To be considered HIGH RISK for Coronavirus (COVID-19), you have to meet the following criteria:  . Traveled to [place], [place], [place], [place] or [place]; or in the [place] to [place], [place], [place], or [place]; and have fever, cough, and shortness of breath within the last 2 weeks of travel OR  . Been in close contact with a person diagnosed with COVID-19 within the last 2 weeks and have fever, cough, and shortness of breath  . IF YOU DO NOT MEET THESE CRITERIA, YOU ARE CONSIDERED LOW RISK FOR COVID-19.   It is vitally important that if you feel that you have an infection such as this virus or any other virus that you stay home and away from places where you may spread it to others.  You should self-quarantine for 14 days if you have symptoms that could potentially be coronavirus and avoid contact with people age 65 and older.   You can use  medication such as A prescription cough medication called Tessalon Perles 100 mg. You may take 1-2 capsules every 8 hours as needed for cough  You may also take acetaminophen (Tylenol) as needed for fever.   Reduce your risk of any infection by using the same precautions used for avoiding the common cold or flu:  . Wash your hands often with soap and warm water for at least 20 seconds.  If soap and water are not readily available, use an alcohol-based hand sanitizer with at least 60% alcohol.  . If coughing or sneezing, cover your mouth and nose by coughing or sneezing into the elbow areas of your shirt or coat, into a tissue or into your sleeve (not your hands). . Avoid shaking hands with others and consider head nods or verbal greetings only. . Avoid touching your eyes, nose, or mouth with unwashed hands.  . Avoid close contact with people who are sick. . Avoid places or events with large numbers of people in one location, like concerts or sporting events. . Carefully consider travel plans you have or are making. . If you are planning any travel outside or inside the [place], visit the CDC's Travelers' Health webpage for the latest health notices. . If you have some symptoms but not all symptoms, continue to monitor at home and seek medical attention if your symptoms worsen. . If you are having a medical emergency, call 911.  HOME CARE . Only take medications as instructed by your medical   team. . Drink plenty of fluids and get plenty of rest. . A steam or ultrasonic humidifier can help if you have congestion.   GET HELP RIGHT AWAY IF: . You develop worsening fever. . You become short of breath . You cough up blood. . Your symptoms become more severe MAKE SURE YOU   Understand these instructions.  Will watch your condition.  Will get help right away if you are not doing well or get worse.  Your e-visit answers were reviewed by a board certified advanced clinical practitioner to  complete your personal care plan.  Depending on the condition, your plan could have included both over the counter or prescription medications.  If there is a problem please reply once you have received a response from your provider. Your safety is important to us.  If you have drug allergies check your prescription carefully.    You can use MyChart to ask questions about today's visit, request a non-urgent call back, or ask for a work or school excuse for 24 hours related to this e-Visit. If it has been greater than 24 hours you will need to follow up with your provider, or enter a new e-Visit to address those concerns. You will get an e-mail in the next two days asking about your experience.  I hope that your e-visit has been valuable and will speed your recovery. Thank you for using e-visits.    

## 2019-03-24 NOTE — ED Provider Notes (Signed)
[facility]    CSN: [id number] Arrival date & time: 03/06/19  1042     History   Chief Complaint Chief Complaint  Patient presents with  . Laceration    HPI Janet Rich is a 23 y.o. female.   23 yo female with a c/o laceration to left thumb that occurred  last night while cutting lettuce (over 18 hours). States unsure of last tetanus. Bleeding controlled. Otherwise healthy.    Laceration    Past Medical History:  Diagnosis Date  . Anemia    with periods  . Ankle fracture    right  . Anxiety   . Asthma   . Back pain   . Depression   . Endometriosis   . GERD (gastroesophageal reflux disease)   . Headache   . Ovarian cyst   . PCOS (polycystic ovarian syndrome)   . Pneumonia    age 23  . PONV (postoperative nausea and vomiting)    nausea after wisdom teeth extraction  . Vaginal Pap smear, abnormal    bx ok    Patient Active Problem List   Diagnosis Date Noted  . Dizziness 03/07/2019  . Mild intermittent asthma without complication 03/07/2019  . Epistaxis 03/07/2019  . Post-nasal drainage 03/07/2019  . Irritable bowel syndrome with diarrhea 03/07/2019  . Gastroesophageal reflux disease without esophagitis 03/07/2019  . Class 2 obesity due to excess calories without serious comorbidity with body mass index (BMI) of 38.0 to 38.9 in adult 09/02/2018  . Bipolar 2 disorder (HCC) 08/12/2018  . History of low birth weight 03/11/2018  . Vulvar atrophy 03/11/2018  . Dysmenorrhea 05/01/2015  . History of migraine headaches 05/01/2015  . Anxiety and depression 05/01/2015  . Family history of endometriosis in first degree relative 05/01/2015    Past Surgical History:  Procedure Laterality Date  . ROBOTIC ASSISTED DIAGNOSTIC LAPAROSCOPY N/A 02/18/2017   Procedure: ROBOTIC ASSISTED DIAGNOSTIC LAPAROSCOPY,EXCISION AND ABLATION OF ENDOMETRIOSIS,LYSIS OF ADHESIONS;  Surgeon: Olivia Mackie, MD;  Location: [facility];  Service: Gynecology;  Laterality: N/A;  .  WISDOM TOOTH EXTRACTION  2013    OB History    Gravida  1   Para  1   Term  1   Preterm  0   AB  0   Living  1     SAB  0   TAB  0   Ectopic  0   Multiple  0   Live Births  1            Home Medications    Prior to Admission medications   Medication Sig Start Date End Date Taking? Authorizing Provider  acetaminophen (TYLENOL) 500 MG tablet Take 500 mg by mouth every 6 (six) hours as needed for headache.   Yes [provider]  albuterol (PROVENTIL HFA;VENTOLIN HFA) 108 (90 Base) MCG/ACT inhaler Inhale 1-2 puffs into the lungs every 6 (six) hours as needed for wheezing or shortness of breath. 11/19/18  Yes Domenick Gong, MD  albuterol (PROVENTIL) (2.5 MG/3ML) 0.083% nebulizer solution Take 3 mLs (2.5 mg total) by nebulization every 6 (six) hours as needed for wheezing or shortness of breath. 01/30/19  Yes Doren Custard, FNP  ALPRAZolam Prudy Feeler) 0.25 MG tablet Take 0.25 mg by mouth at bedtime as needed for anxiety.   Yes [provider]  budesonide-formoterol (SYMBICORT) 80-4.5 MCG/ACT inhaler Inhale 2 puffs into the lungs 2 (two) times daily. 01/30/19  Yes Doren Custard, FNP  lamoTRIgine (LAMICTAL) 100 MG tablet 150  mg daily.    Yes [provider]  amitriptyline (ELAVIL) 25 MG tablet Take 1 tablet (25 mg total) by mouth at bedtime. 10/24/18 11/23/18  Toney Reil, MD  benzonatate (TESSALON PERLES) 100 MG capsule Take 1 capsule (100 mg total) by mouth 3 (three) times daily as needed. 03/19/19   Junie Spencer, FNP  dicyclomine (BENTYL) 10 MG capsule Take 1 capsule (10 mg total) by mouth 4 (four) times daily -  before meals and at bedtime for 30 doses. 10/24/18 11/01/18  Toney Reil, MD  ibuprofen (ADVIL,MOTRIN) 800 MG tablet Take 1 tablet (800 mg total) by mouth 3 (three) times daily. 07/09/18   Payton Mccallum, MD  levocetirizine (XYZAL) 5 MG tablet Take 1 tablet (5 mg total) by mouth every evening. 03/07/19   Doren Custard, FNP   meclizine (ANTIVERT) 25 MG tablet Take 0.5-1 tablets (12.5-25 mg total) by mouth 3 (three) times daily as needed for dizziness. 03/07/19   Doren Custard, FNP  omeprazole (PRILOSEC) 40 MG capsule Take 1 capsule (40 mg total) by mouth daily before breakfast. 10/24/18 11/23/18  Vanga, Loel Dubonnet, MD  Prenat-Fe Carbonyl-FA-Omega 3 (ONE-A-DAY WOMENS PRENATAL 1 PO) Take by mouth.    [provider]  saline (AYR) GEL Place 1 application into the nose every 6 (six) hours as needed. 03/07/19   Doren Custard, FNP  Spacer/Aero-Holding Chambers (AEROCHAMBER PLUS) inhaler Use as instructed 11/19/18   Domenick Gong, MD  triamcinolone (NASACORT) 55 MCG/ACT AERO nasal inhaler Place 2 sprays into the nose daily. 03/07/19   Doren Custard, FNP    Family History Family History  Problem Relation Age of Onset  . Endometriosis Mother   . Bipolar disorder Mother   . Diabetes Mother        prediabetes  . Breast cancer Paternal Grandmother   . Cancer Paternal Grandmother   . Depression Paternal Grandmother   . Schizophrenia Father   . Depression Father   . Heart disease Neg Hx   . Colon cancer Neg Hx   . Ovarian cancer Neg Hx     Social History Social History   Tobacco Use  . Smoking status: Former Smoker    Types: Cigarettes    Last attempt to quit: 12/20/2009    Years since quitting: 9.2  . Smokeless tobacco: Never Used  . Tobacco comment: age [age], quit  Substance Use Topics  . Alcohol use: No    Frequency: Never    Comment: occassionally  . Drug use: No     Allergies   Other and Latex   Review of Systems Review of Systems   Physical Exam Triage Vital Signs ED Triage Vitals  Enc Vitals Group     BP 03/06/19 1112 132/79     Pulse Rate 03/06/19 1112 83     Resp 03/06/19 1112 18     Temp 03/06/19 1112 98.2 F (36.8 C)     Temp Source 03/06/19 1112 Oral     SpO2 03/06/19 1112 100 %     Weight 03/06/19 1108 210 lb (95.3 kg)     Height 03/06/19 1108  (1.549 m)      Head Circumference --      Peak Flow --      Pain Score 03/06/19 1108 6     Pain Loc --      Pain Edu? --      Excl. in GC? --    No data found.  Updated  Vital Signs BP 132/79 (BP Location: Left Arm)   Pulse 83   Temp 98.2 F (36.8 C) (Oral)   Resp 18   Ht  (1.549 m)   Wt 95.3 kg   LMP 01/18/2019 (Approximate)   SpO2 100%   BMI 39.68 kg/m   Visual Acuity Right Eye Distance:   Left Eye Distance:   Bilateral Distance:    Right Eye Near:   Left Eye Near:    Bilateral Near:     Physical Exam Vitals signs and nursing note reviewed.  Constitutional:      General: She is not in acute distress.    Appearance: She is not toxic-appearing or diaphoretic.  Musculoskeletal:     Left hand: She exhibits laceration (superficial 1cm laceration at tip of thumb). She exhibits normal range of motion, no tenderness, no bony tenderness, normal two-point discrimination, normal capillary refill, no deformity and no swelling. Normal sensation noted. Normal strength noted.     Comments: Normal range of motion of thumb and neurovascularly intact  Neurological:     Mental Status: She is alert.      UC Treatments / Results  Labs (all labs ordered are listed, but only abnormal results are displayed) Labs Reviewed - No data to display  EKG None  Radiology No results found.  Procedures Laceration Repair Date/Time: 03/24/2019 5:52 PM Performed by: Payton Mccallum, MD Authorized by: Payton Mccallum, MD   Consent:    Consent obtained:  Verbal   Consent given by:  Patient   Risks discussed:  Infection, need for additional repair, nerve damage, pain, poor cosmetic result, poor wound healing, retained foreign body, tendon damage and vascular damage   Alternatives discussed:  No treatment Anesthesia (see MAR for exact dosages):    Anesthesia method:  None Laceration details:    Location:  Finger   Finger location:  L thumb   Length (cm):  1   Laceration depth: very superficial.  Repair type:    Repair type:  Simple Exploration:    Hemostasis achieved with:  Direct pressure   Wound exploration: wound explored through full range of motion     Contaminated: no   Treatment:    Area cleansed with:  Hibiclens   Amount of cleaning:  Standard   Irrigation solution:  Sterile water   Irrigation method: soak.   Foreign body removal: no foreign bodies.   Skin repair:    Repair method:  Steri-Strips   Number of Steri-Strips:  4 Approximation:    Approximation:  Close Post-procedure details:    Dressing:  Splint for protection and sterile dressing   Patient tolerance of procedure:  Tolerated well, no immediate complications   (including critical care time)  Medications Ordered in UC Medications  Tdap (BOOSTRIX) injection 0.5 mL (0.5 mLs Intramuscular Given 03/06/19 1133)    Initial Impression / Assessment and Plan / UC Course  I have reviewed the triage vital signs and the nursing notes.  Pertinent labs & imaging results that were available during my care of the patient were reviewed by me and considered in my medical decision making (see chart for details).      Final Clinical Impressions(s) / UC Diagnoses   Final diagnoses:  Laceration of skin of left thumb, initial encounter     Discharge Instructions     Keep wound clean as much as possible and watch for any signs of infection; follow up if signs of infection develop Over the counter antibiotic ointment Protect with  finger splint    ED Prescriptions    None      1. diagnosis reviewed with patient 2. Procedure as above 3. Recommend supportive treatment with routine wound care 4. Follow-up prn if symptoms worsen or don't improve  Controlled Substance Prescriptions [place] Controlled Substance Registry consulted? Not Applicable   Payton Mccallumonty, Amaani Guilbault, MD 03/24/19 [phone]

## 2019-03-27 ENCOUNTER — Ambulatory Visit (INDEPENDENT_AMBULATORY_CARE_PROVIDER_SITE_OTHER): Payer: BC Managed Care – PPO | Admitting: Nurse Practitioner

## 2019-03-27 ENCOUNTER — Telehealth: Payer: Self-pay | Admitting: *Deleted

## 2019-03-27 ENCOUNTER — Telehealth: Payer: Self-pay | Admitting: Nurse Practitioner

## 2019-03-27 ENCOUNTER — Ambulatory Visit: Payer: Self-pay | Admitting: *Deleted

## 2019-03-27 ENCOUNTER — Encounter: Payer: Self-pay | Admitting: Nurse Practitioner

## 2019-03-27 VITALS — Temp 98.7°F | Resp 18

## 2019-03-27 DIAGNOSIS — R0602 Shortness of breath: Secondary | ICD-10-CM

## 2019-03-27 DIAGNOSIS — J4540 Moderate persistent asthma, uncomplicated: Secondary | ICD-10-CM

## 2019-03-27 DIAGNOSIS — Z20822 Contact with and (suspected) exposure to covid-19: Secondary | ICD-10-CM

## 2019-03-27 DIAGNOSIS — R6889 Other general symptoms and signs: Secondary | ICD-10-CM | POA: Diagnosis not present

## 2019-03-27 DIAGNOSIS — R05 Cough: Secondary | ICD-10-CM | POA: Diagnosis not present

## 2019-03-27 DIAGNOSIS — N809 Endometriosis, unspecified: Secondary | ICD-10-CM | POA: Insufficient documentation

## 2019-03-27 DIAGNOSIS — E282 Polycystic ovarian syndrome: Secondary | ICD-10-CM | POA: Insufficient documentation

## 2019-03-27 DIAGNOSIS — R059 Cough, unspecified: Secondary | ICD-10-CM

## 2019-03-27 MED ORDER — BENZONATATE 100 MG PO CAPS: 200.0000 mg | ORAL_CAPSULE | Freq: Three times a day (TID) | ORAL | 0 refills | Status: DC | PRN

## 2019-03-27 NOTE — Telephone Encounter (Signed)
Patient is having some symptoms- cough and SOB where she is having to use her inhalers more then normal- she is requesting COVID testing. Call to office for appointment.  Reason for Disposition . MILD difficulty breathing (e.g., minimal/no SOB at rest, SOB with walking, pulse <100)  Answer Assessment - Initial Assessment Questions 1. COVID-19 DIAGNOSIS: "Who made your Coronavirus (COVID-19) diagnosis?" "Was it confirmed by a positive lab test?" If not diagnosed by a HCP, ask "Are there lots of cases (community spread) where you live?" (See public health department website, if unsure)     Patient is calling to request test- she is having symptoms 2. ONSET: "When did the COVID-19 symptoms start?"      Patient states she has had cough for 2 days 3. WORST SYMPTOM: "What is your worst symptom?" (e.g., cough, fever, shortness of breath, muscle aches)     cough 4. COUGH: "Do you have a cough?" If so, ask: "How bad is the cough?"       Yes- constant tickling- slight mucus 5. FEVER: "Do you have a fever?" If so, ask: "What is your temperature, how was it measured, and when did it start?"     No fever- 98 6. RESPIRATORY STATUS: "Describe your breathing?" (e.g., shortness of breath, wheezing, unable to speak)      Patient has to catch breath a lot- has to take a deep breath when talking- harder to breath- is having to use inhaler- Albuterol- twice yesterday and once today so far 7. BETTER-SAME-WORSE: "Are you getting better, staying the same or getting worse compared to yesterday?"  If getting worse, ask, "In what way?"     Worse- cough and SOB is worse 8. HIGH RISK DISEASE: "Do you have any chronic medical problems?" (e.g., asthma, heart or lung disease, weak immune system, etc.)     Asthma, anemia 9. PREGNANCY: "Is there any chance you are pregnant?" "When was your last menstrual period?"     No- currently on cycle 10. OTHER SYMPTOMS: "Do you have any other symptoms?"  (e.g., chills, fatigue,  headache, loss of smell or taste, muscle pain, sore throat)       Fatigue, sore throat, cold/hot spells  Protocols used: CORONAVIRUS (COVID-19) DIAGNOSED OR SUSPECTED-A-AH

## 2019-03-27 NOTE — Telephone Encounter (Signed)
Patient called and scheduled for testing at [facility] site on 03/28/19 at 8:15 am. Pt advised to wear a mask and remain in car at appt time. Understanding verbalized.

## 2019-03-27 NOTE — Progress Notes (Signed)
Virtual Visit via Video Note  I connected with Janet Rich on 03/27/19 at  2:40 PM EDT by a video enabled telemedicine application and verified that I am speaking with the correct person using two identifiers.   Staff discussed the limitations of evaluation and management by telemedicine and the availability of in person appointments. The patient expressed understanding and agreed to proceed.  Patient location: home  My location: work office Other people present: none HPI   Patient endorses cough, shortness of breath, post nasal drip, sore throat, mild nasal congestion and headache. Symptoms started yesterday and started strong. Cough is mildly productive- thick sputum. States this does not feel like her asthma flares.  Patient has asthma and takes Symbicort 2 puffs BID and albuterol PRN. Patient states has needed albuterol inhaler twice throughout the day and used nebulizer at night. States she did have post-nasal drip and had xyzal.  Denies fevers, chest pain, nausea, vomiting, diarrhea, change in smell or taste.   Patient went to protest over the weekend and stopped at bar on the way home.  PHQ2/9: [facility] 03/07/2019 02/23/2019 01/30/2019 12/14/2018 09/02/2018  Decreased Interest 0 0 1 0 1  Down, Depressed, Hopeless 1 3 1 1 1   PHQ - 2 Score 1 3 2 1 2   Altered sleeping 0 0 0 0 0  Tired, decreased energy 0 0 1 1 1   Change in appetite 0 0 1 0 1  Feeling bad or failure about yourself  1 0 0 0 1  Trouble concentrating 0 0 0 0 0  Moving slowly or fidgety/restless 0 0 0 0 0  Suicidal thoughts 0 0 0 0 0  PHQ-9 Score 2 3 4 2 5   Difficult doing work/chores Not difficult at all Somewhat difficult Not difficult at all Not difficult at all Not difficult at all     PHQ reviewed. Negative  Patient Active Problem List   Diagnosis Date Noted  . Dizziness 03/07/2019  . Mild intermittent asthma without complication 03/07/2019  . Epistaxis 03/07/2019  . Post-nasal drainage  03/07/2019  . Irritable bowel syndrome with diarrhea 03/07/2019  . Gastroesophageal reflux disease without esophagitis 03/07/2019  . Class 2 obesity due to excess calories without serious comorbidity with body mass index (BMI) of 38.0 to 38.9 in adult 09/02/2018  . Bipolar 2 disorder (HCC) 08/12/2018  . History of low birth weight 03/11/2018  . Vulvar atrophy 03/11/2018  . Dysmenorrhea 05/01/2015  . History of migraine headaches 05/01/2015  . Anxiety and depression 05/01/2015  . Family history of endometriosis in first degree relative 05/01/2015    Past Medical History:  Diagnosis Date  . Anemia    with periods  . Ankle fracture    right  . Anxiety   . Asthma   . Back pain   . Depression   . Endometriosis   . GERD (gastroesophageal reflux disease)   . Headache   . Ovarian cyst   . PCOS (polycystic ovarian syndrome)   . Pneumonia    age 34  . PONV (postoperative nausea and vomiting)    nausea after wisdom teeth extraction  . Vaginal Pap smear, abnormal    bx ok    Past Surgical History:  Procedure Laterality Date  . ROBOTIC ASSISTED DIAGNOSTIC LAPAROSCOPY N/A 02/18/2017   Procedure: ROBOTIC ASSISTED DIAGNOSTIC LAPAROSCOPY,EXCISION AND ABLATION OF ENDOMETRIOSIS,LYSIS OF ADHESIONS;  Surgeon: Olivia Mackieichard Taavon, MD;  Location: [facility];  Service: Gynecology;  Laterality: N/A;  . WISDOM TOOTH EXTRACTION  2013  Social History   Tobacco Use  . Smoking status: Former Smoker    Types: Cigarettes    Last attempt to quit: 12/20/2009    Years since quitting: 9.2  . Smokeless tobacco: Never Used  . Tobacco comment: age [age], quit  Substance Use Topics  . Alcohol use: No    Frequency: Never    Comment: occassionally     Current Outpatient Medications:  .  acetaminophen (TYLENOL) 500 MG tablet, Take 500 mg by mouth every 6 (six) hours as needed for headache., Disp: , Rfl:  .  albuterol (PROVENTIL HFA;VENTOLIN HFA) 108 (90 Base) MCG/ACT inhaler, Inhale 1-2 puffs into the lungs  every 6 (six) hours as needed for wheezing or shortness of breath., Disp: 1 Inhaler, Rfl: 0 .  albuterol (PROVENTIL) (2.5 MG/3ML) 0.083% nebulizer solution, Take 3 mLs (2.5 mg total) by nebulization every 6 (six) hours as needed for wheezing or shortness of breath., Disp: 150 mL, Rfl: 1 .  ALPRAZolam (XANAX) 0.25 MG tablet, Take 0.25 mg by mouth at bedtime as needed for anxiety., Disp: , Rfl:  .  amitriptyline (ELAVIL) 25 MG tablet, Take 1 tablet (25 mg total) by mouth at bedtime., Disp: 30 tablet, Rfl: 1 .  benzonatate (TESSALON PERLES) 100 MG capsule, Take 1 capsule (100 mg total) by mouth 3 (three) times daily as needed., Disp: 20 capsule, Rfl: 0 .  budesonide-formoterol (SYMBICORT) 80-4.5 MCG/ACT inhaler, Inhale 2 puffs into the lungs 2 (two) times daily., Disp: 1 Inhaler, Rfl: 3 .  dicyclomine (BENTYL) 10 MG capsule, Take 1 capsule (10 mg total) by mouth 4 (four) times daily -  before meals and at bedtime for 30 doses., Disp: 30 capsule, Rfl: 0 .  ibuprofen (ADVIL,MOTRIN) 800 MG tablet, Take 1 tablet (800 mg total) by mouth 3 (three) times daily., Disp: 30 tablet, Rfl: 0 .  lamoTRIgine (LAMICTAL) 100 MG tablet, 150 mg daily. , Disp: , Rfl:  .  levocetirizine (XYZAL) 5 MG tablet, Take 1 tablet (5 mg total) by mouth every evening., Disp: 90 tablet, Rfl: 1 .  meclizine (ANTIVERT) 25 MG tablet, Take 0.5-1 tablets (12.5-25 mg total) by mouth 3 (three) times daily as needed for dizziness., Disp: 20 tablet, Rfl: 0 .  omeprazole (PRILOSEC) 40 MG capsule, Take 1 capsule (40 mg total) by mouth daily before breakfast., Disp: 30 capsule, Rfl: 1 .  Prenat-Fe Carbonyl-FA-Omega 3 (ONE-A-DAY WOMENS PRENATAL 1 PO), Take by mouth., Disp: , Rfl:  .  saline (AYR) GEL, Place 1 application into the nose every 6 (six) hours as needed., Disp: 14.1 g, Rfl: 6 .  Spacer/Aero-Holding Chambers (AEROCHAMBER PLUS) inhaler, Use as instructed, Disp: 1 each, Rfl: 2 .  triamcinolone (NASACORT) 55 MCG/ACT AERO nasal inhaler, Place  2 sprays into the nose daily., Disp: 1 Inhaler, Rfl: 12  Allergies  Allergen Reactions  . Other     Must have anti-emetic prior to being given any narcotics   . Latex Rash    ROS   No other specific complaints in a complete review of systems (except as listed in HPI above).  Objective  Vitals:   03/27/19 1444  Resp: 18  Temp: 98.7 F (37.1 C)     There is no height or weight on file to calculate BMI.  Nursing Note and Vital Signs reviewed.  Physical Exam  Constitutional: Patient appears well-developed and well-nourished. No distress.  HENT: Head: Normocephalic and atraumatic. Pulmonary/Chest: Effort normal  Musculoskeletal: Normal range of motion,  Neurological: alert and oriented, speech  normal.  Skin: No rash noted. No erythema.  Psychiatric: Patient has a normal mood and affect. behavior is normal. Judgment and thought content normal.    Assessment & Plan  1. Cough Discussed OTC treatment  - benzonatate (TESSALON PERLES) 100 MG capsule; Take 2 capsules (200 mg total) by mouth 3 (three) times daily as needed for cough.  Dispense: 30 capsule; Refill: 0  2. Shortness of breath Albuterol PRN   3. Suspected Covid-19 Virus Infection Self-isolate - MYCHART COVID-19 HOME MONITORING PROGRAM - Temperature monitoring; Future 4. Moderate persistent asthma without complication Daily meds and PRN albuterol    Follow Up Instructions:    I discussed the assessment and treatment plan with the patient. The patient was provided an opportunity to ask questions and all were answered. The patient agreed with the plan and demonstrated an understanding of the instructions.   The patient was advised to call back or seek an in-person evaluation if the symptoms worsen or if the condition fails to improve as anticipated.  I provided 13 minutes of non-face-to-face time during this encounter.   Fredderick Severance, NP

## 2019-03-27 NOTE — Telephone Encounter (Signed)
Suspected COVID Recommend testing Patient has cough, shortness of breath, headache Comorbidity: Asthma

## 2019-03-27 NOTE — Patient Instructions (Signed)
OTC treatments:  For Fever/Pain: Acetaminophen every 6 hours as needed (maximum of 3000mg  a day). If you are still uncomfortable you can add ibuprofen OR naproxen  For coughing: try dextromethorphan for a cough suppressant, and/or a cool mist humidifier, lozenges  For sore throat: saline gargles, honey herbal tea, lozenges, throat spray  To dry out your nose: try an antihistamine like loratadine (non-sedating) or diphenhydramine (sedating) or others To relieve a stuffy nose: try a decongestant like flonase or try the neti pot To make blowing your nose easier and relieve chest congestion: guaifenesin 400mg  every 4-6 hours of guaifenesin ER [phone] mg every 12 hours. Do not take more than 2,400mg  a day.       Person Under Monitoring Name: Janet Rich  Location: [address] [place] [id number]   Infection Prevention Recommendations for Individuals Confirmed to have, or Being Evaluated for, 2019 Novel Coronavirus (COVID-19) Infection Who Receive Care at Home  Individuals who are confirmed to have, or are being evaluated for, COVID-19 should follow the prevention steps below until a healthcare provider or local or state health department says they can return to normal activities.  Stay home except to get medical care You should restrict activities outside your home, except for getting medical care. Do not go to work, school, or public areas, and do not use public transportation or taxis.  Call ahead before visiting your doctor Before your medical appointment, call the healthcare provider and tell them that you have, or are being evaluated for, COVID-19 infection. This will help the healthcare provider's office take steps to keep other people from getting infected. Ask your healthcare provider to call the local or state health department.  Monitor your symptoms Seek prompt medical attention if your illness is worsening (e.g., difficulty breathing). Before going to your medical  appointment, call the healthcare provider and tell them that you have, or are being evaluated for, COVID-19 infection. Ask your healthcare provider to call the local or state health department.  Wear a facemask You should wear a facemask that covers your nose and mouth when you are in the same room with other people and when you visit a healthcare provider. People who live with or visit you should also wear a facemask while they are in the same room with you.  Separate yourself from other people in your home As much as possible, you should stay in a different room from other people in your home. Also, you should use a separate bathroom, if available.  Avoid sharing household items You should not share dishes, drinking glasses, cups, eating utensils, towels, bedding, or other items with other people in your home. After using these items, you should wash them thoroughly with soap and water.  Cover your coughs and sneezes Cover your mouth and nose with a tissue when you cough or sneeze, or you can cough or sneeze into your sleeve. Throw used tissues in a lined trash can, and immediately wash your hands with soap and water for at least 20 seconds or use an alcohol-based hand rub.  Wash your [employer] Pacific Corporationhands Wash your hands often and thoroughly with soap and water for at least 20 seconds. You can use an alcohol-based hand sanitizer if soap and water are not available and if your hands are not visibly dirty. Avoid touching your eyes, nose, and mouth with unwashed hands.   Prevention Steps for Caregivers and Household Members of Individuals Confirmed to have, or Being Evaluated for, COVID-19 Infection Being Cared  for in the Home  If you live with, or provide care at home for, a person confirmed to have, or being evaluated for, COVID-19 infection please follow these guidelines to prevent infection:  Follow healthcare provider's instructions Make sure that you understand and can help the patient  follow any healthcare provider instructions for all care.  Provide for the patient's basic needs You should help the patient with basic needs in the home and provide support for getting groceries, prescriptions, and other personal needs.  Monitor the patient's symptoms If they are getting sicker, call his or her medical provider and tell them that the patient has, or is being evaluated for, COVID-19 infection. This will help the healthcare provider's office take steps to keep other people from getting infected. Ask the healthcare provider to call the local or state health department.  Limit the number of people who have contact with the patient  If possible, have only one caregiver for the patient.  Other household members should stay in another home or place of residence. If this is not possible, they should stay  in another room, or be separated from the patient as much as possible. Use a separate bathroom, if available.  Restrict visitors who do not have an essential need to be in the home.  Keep older adults, very young children, and other sick people away from the patient Keep older adults, very young children, and those who have compromised immune systems or chronic health conditions away from the patient. This includes people with chronic heart, lung, or kidney conditions, diabetes, and cancer.  Ensure good ventilation Make sure that shared spaces in the home have good air flow, such as from an air conditioner or an opened window, weather permitting.  Wash your hands often  Wash your hands often and thoroughly with soap and water for at least 20 seconds. You can use an alcohol based hand sanitizer if soap and water are not available and if your hands are not visibly dirty.  Avoid touching your eyes, nose, and mouth with unwashed hands.  Use disposable paper towels to dry your hands. If not available, use dedicated cloth towels and replace them when they become wet.  Wear a  facemask and gloves  Wear a disposable facemask at all times in the room and gloves when you touch or have contact with the patient's blood, body fluids, and/or secretions or excretions, such as sweat, saliva, sputum, nasal mucus, vomit, urine, or feces.  Ensure the mask fits over your nose and mouth tightly, and do not touch it during use.  Throw out disposable facemasks and gloves after using them. Do not reuse.  Wash your hands immediately after removing your facemask and gloves.  If your personal clothing becomes contaminated, carefully remove clothing and launder. Wash your hands after handling contaminated clothing.  Place all used disposable facemasks, gloves, and other waste in a lined container before disposing them with other household waste.  Remove gloves and wash your hands immediately after handling these items.  Do not share dishes, glasses, or other household items with the patient  Avoid sharing household items. You should not share dishes, drinking glasses, cups, eating utensils, towels, bedding, or other items with a patient who is confirmed to have, or being evaluated for, COVID-19 infection.  After the person uses these items, you should wash them thoroughly with soap and water.  Wash laundry thoroughly  Immediately remove and wash clothes or bedding that have blood, body fluids, and/or secretions  or excretions, such as sweat, saliva, sputum, nasal mucus, vomit, urine, or feces, on them.  Wear gloves when handling laundry from the patient.  Read and follow directions on labels of laundry or clothing items and detergent. In general, wash and dry with the warmest temperatures recommended on the label.  Clean all areas the individual has used often  Clean all touchable surfaces, such as counters, tabletops, doorknobs, bathroom fixtures, toilets, phones, keyboards, tablets, and bedside tables, every day. Also, clean any surfaces that may have blood, body fluids, and/or  secretions or excretions on them.  Wear gloves when cleaning surfaces the patient has come in contact with.  Use a diluted bleach solution (e.g., dilute bleach with 1 part bleach and 10 parts water) or a household disinfectant with a label that says EPA-registered for coronaviruses. To make a bleach solution at home, add 1 tablespoon of bleach to 1 quart (4 cups) of water. For a larger supply, add  cup of bleach to 1 gallon (16 cups) of water.  Read labels of cleaning products and follow recommendations provided on product labels. Labels contain instructions for safe and effective use of the cleaning product including precautions you should take when applying the product, such as wearing gloves or eye protection and making sure you have good ventilation during use of the product.  Remove gloves and wash hands immediately after cleaning.  Monitor yourself for signs and symptoms of illness Caregivers and household members are considered close contacts, should monitor their health, and will be asked to limit movement outside of the home to the extent possible. Follow the monitoring steps for close contacts listed on the symptom monitoring form.   ? If you have additional questions, contact your local health department or call the epidemiologist on call at [phone] (available 24/7). ? This guidance is subject to change. For the most up-to-date guidance from [employer], please refer to their website: YouBlogs.pl

## 2019-03-27 NOTE — Telephone Encounter (Signed)
Please make virtual appointment  

## 2019-03-27 NOTE — Telephone Encounter (Signed)
Pt scheduled for testing at [facility] site on 03/28/19.

## 2019-03-28 ENCOUNTER — Other Ambulatory Visit: Payer: Self-pay

## 2019-03-28 ENCOUNTER — Ambulatory Visit: Payer: BLUE CROSS/BLUE SHIELD | Admitting: Family Medicine

## 2019-03-28 DIAGNOSIS — R6889 Other general symptoms and signs: Secondary | ICD-10-CM | POA: Diagnosis not present

## 2019-03-28 DIAGNOSIS — Z20822 Contact with and (suspected) exposure to covid-19: Secondary | ICD-10-CM

## 2019-03-31 LAB — NOVEL CORONAVIRUS, NAA: SARS-CoV-2, NAA: NOT DETECTED

## 2019-04-03 ENCOUNTER — Telehealth: Payer: Self-pay | Admitting: Family Medicine

## 2019-04-03 NOTE — Telephone Encounter (Signed)
Copied from [place] [id number]. Topic: General - Other >> Apr 03, 2019  9:53 AM Leward Quan A wrote: Reason for CRM: Patient called to say that she tested negative for COVID but still have a very deep cough asking for a call back from Raelyn Ensign or nurse. She would like to discuss if she possibly have Bronchitis again. Please call  Ph# [phone]

## 2019-04-03 NOTE — Telephone Encounter (Signed)
Did cough medicine help with cough and did cough improve?  I can send a cough syrup but it will make her drowsy, or she can take mucinex-DM as well.  Can also do virtual follow-up

## 2019-04-04 MED ORDER — PROMETHAZINE-DM 6.25-15 MG/5ML PO SYRP: 2.5000 mL | ORAL_SOLUTION | Freq: Four times a day (QID) | ORAL | 0 refills | Status: DC | PRN

## 2019-04-04 NOTE — Addendum Note (Signed)
Addended by: Sibyl Parr on: 04/04/2019 03:08 PM   Modules accepted: Orders

## 2019-04-04 NOTE — Addendum Note (Signed)
Addended by: Fredderick Severance on: 04/04/2019 03:40 PM   Modules accepted: Orders

## 2019-04-04 NOTE — Telephone Encounter (Addendum)
Pt stated she will continue with mucinex and would like a cough medicine.

## 2019-04-17 ENCOUNTER — Telehealth: Payer: Self-pay | Admitting: Family Medicine

## 2019-04-17 NOTE — Telephone Encounter (Signed)
Can provide a note if wife is willing that she was seen for suspected covid symptoms but tested negative and husband self-quarantined due to close contact until her results came back on 04/03/2019. Are we his PCP- if not recommend follow-up with them.

## 2019-04-17 NOTE — Telephone Encounter (Signed)
Left detailed VM.  

## 2019-04-17 NOTE — Telephone Encounter (Signed)
Patient returned call to [place]. Asking for a call back at Ph# [phone]

## 2019-04-17 NOTE — Telephone Encounter (Signed)
Pt husband need a note for his job stating that he needed to be quarantine for seven days until wife received results. Pt was tested on 6.2.2020 and she received the results on 6.12.2020

## 2019-04-18 NOTE — Telephone Encounter (Signed)
Okay- Im good with that.

## 2019-04-18 NOTE — Telephone Encounter (Signed)
Janet Rich just needs a note that says she was told to quarantine from 03/27/19-04/03/19.

## 2019-04-18 NOTE — Telephone Encounter (Signed)
Returned patient's call, no answer, left VM

## 2019-04-19 ENCOUNTER — Ambulatory Visit (INDEPENDENT_AMBULATORY_CARE_PROVIDER_SITE_OTHER): Payer: BC Managed Care – PPO | Admitting: Family Medicine

## 2019-04-19 ENCOUNTER — Encounter: Payer: Self-pay | Admitting: Family Medicine

## 2019-04-19 ENCOUNTER — Other Ambulatory Visit: Payer: Self-pay

## 2019-04-19 DIAGNOSIS — Z8742 Personal history of other diseases of the female genital tract: Secondary | ICD-10-CM

## 2019-04-19 DIAGNOSIS — R05 Cough: Secondary | ICD-10-CM

## 2019-04-19 DIAGNOSIS — N809 Endometriosis, unspecified: Secondary | ICD-10-CM

## 2019-04-19 DIAGNOSIS — R059 Cough, unspecified: Secondary | ICD-10-CM

## 2019-04-19 DIAGNOSIS — J4540 Moderate persistent asthma, uncomplicated: Secondary | ICD-10-CM | POA: Diagnosis not present

## 2019-04-19 DIAGNOSIS — E282 Polycystic ovarian syndrome: Secondary | ICD-10-CM | POA: Diagnosis not present

## 2019-04-19 DIAGNOSIS — N946 Dysmenorrhea, unspecified: Secondary | ICD-10-CM

## 2019-04-19 MED ORDER — TRAMADOL HCL 50 MG PO TABS: 25.0000 mg | ORAL_TABLET | Freq: Two times a day (BID) | ORAL | 0 refills | Status: DC | PRN

## 2019-04-19 MED ORDER — PREDNISONE 10 MG (21) PO TBPK: ORAL_TABLET | ORAL | 0 refills | Status: DC

## 2019-04-19 MED ORDER — AZITHROMYCIN 250 MG PO TABS: ORAL_TABLET | ORAL | 0 refills | Status: DC

## 2019-04-19 NOTE — Progress Notes (Signed)
Name: Janet Rich   MRN: [id number]    DOB: 03/15/1996   Date:04/19/2019       Progress Note  Subjective  Chief Complaint  Chief Complaint  Patient presents with  . Asthma    follow up  . Endometriosis    I connected with  Janet Janet Rich  on 04/19/19 at 11:20 AM EDT by a video enabled telemedicine application and verified that I am speaking with the correct person using two identifiers.  I discussed the limitations of evaluation and management by telemedicine and the availability of in person appointments. The patient expressed understanding and agreed to proceed. Staff also discussed with the patient that there may be a patient responsible charge related to this service. Patient Location: Home Provider Location: Home Additional Individuals present: None  HPI  URI/Viral Illness/Asthma Exacerbation: She recovered from recent URI for the most part, but she is still having a lot of coughing, she uses Symbicort twice daily, and is using albuterol Neb PRN which does help. Shortness of breath with exertion.  No chest pain or audible wheezing, no     Endometriosis: She has significant pain on the first 1-2 days of her cycle for her pain.  She is not able to function and often has trouble getting out of bed on these days. She has taken tramadol in the past and this has helped. We will refer her to GYN today, and advised that I will provide a limited amount of tramadol with no refills today.  Patient Active Problem List   Diagnosis Date Noted  . Endometriosis 03/27/2019  . PCOS (polycystic ovarian syndrome) 03/27/2019  . Moderate persistent asthma 03/07/2019  . Post-nasal drainage 03/07/2019  . Irritable bowel syndrome with diarrhea 03/07/2019  . Gastroesophageal reflux disease without esophagitis 03/07/2019  . Class 2 obesity due to excess calories without serious comorbidity with body mass index (BMI) of 38.0 to 38.9 in adult 09/02/2018  . Bipolar 2 disorder (HCC) 08/12/2018  . History  of low birth weight 03/11/2018  . Vulvar atrophy 03/11/2018  . Dysmenorrhea 05/01/2015  . History of migraine headaches 05/01/2015  . Anxiety and depression 05/01/2015    Past Surgical History:  Procedure Laterality Date  . ROBOTIC ASSISTED DIAGNOSTIC LAPAROSCOPY N/A 02/18/2017   Procedure: ROBOTIC ASSISTED DIAGNOSTIC LAPAROSCOPY,EXCISION AND ABLATION OF ENDOMETRIOSIS,LYSIS OF ADHESIONS;  Surgeon: Olivia Mackieichard Taavon, MD;  Location: [facility];  Service: Gynecology;  Laterality: N/A;  . WISDOM TOOTH EXTRACTION  2013    Family History  Problem Relation Age of Onset  . Endometriosis Mother   . Bipolar disorder Mother   . Diabetes Mother        prediabetes  . Breast cancer Paternal Grandmother   . Cancer Paternal Grandmother   . Depression Paternal Grandmother   . Schizophrenia Father   . Depression Father   . Heart disease Neg Hx   . Colon cancer Neg Hx   . Ovarian cancer Neg Hx     Social History   Socioeconomic History  . Marital status: Married    Spouse name: Janet Rich  . Number of children: 1  . Years of education: Not on file  . Highest education level: Not on file  Occupational History  . Occupation: Museum/gallery conservatorwaitress  Social Needs  . Financial resource strain: Not hard at all  . Food insecurity    Worry: Never true    Inability: Never true  . Transportation needs    Medical: No    Non-medical: No  Tobacco Use  .  Smoking status: Former Smoker    Types: Cigarettes    Quit date: 12/20/2009    Years since quitting: 9.3  . Smokeless tobacco: Never Used  . Tobacco comment: [age], quit  Substance and Sexual Activity  . Alcohol use: No    Frequency: Never    Comment: occassionally  . Drug use: No  . Sexual activity: Yes    Partners: Female, Female    Birth control/protection: None    Comment: last sex mid Jan 2019  Lifestyle  . Physical activity    Days per week: 0 days    Minutes per session: 0 min  . Stress: Not at all  Relationships  . Social connections    Talks on  phone: More than three times a week    Gets together: More than three times a week    Attends religious service: Never    Active member of club or organization: No    Attends meetings of clubs or organizations: Never    Relationship status: Married  . Intimate partner violence    Fear of current or ex partner: No    Emotionally abused: No    Physically abused: No    Forced sexual activity: No  Other Topics Concern  . Not on file  Social History Narrative  . Not on file     Current Outpatient Medications:  .  acetaminophen (TYLENOL) 500 MG tablet, Take 500 mg by mouth every 6 (six) hours as needed for headache., Disp: , Rfl:  .  albuterol (PROVENTIL HFA;VENTOLIN HFA) 108 (90 Base) MCG/ACT inhaler, Inhale 1-2 puffs into the lungs every 6 (six) hours as needed for wheezing or shortness of breath., Disp: 1 Inhaler, Rfl: 0 .  albuterol (PROVENTIL) (2.5 MG/3ML) 0.083% nebulizer solution, Take 3 mLs (2.5 mg total) by nebulization every 6 (six) hours as needed for wheezing or shortness of breath., Disp: 150 mL, Rfl: 1 .  ALPRAZolam (XANAX) 0.25 MG tablet, Take 0.25 mg by mouth at bedtime as needed for anxiety., Disp: , Rfl:  .  budesonide-formoterol (SYMBICORT) 80-4.5 MCG/ACT inhaler, Inhale 2 puffs into the lungs 2 (two) times daily., Disp: 1 Inhaler, Rfl: 3 .  ibuprofen (ADVIL,MOTRIN) 800 MG tablet, Take 1 tablet (800 mg total) by mouth 3 (three) times daily., Disp: 30 tablet, Rfl: 0 .  lamoTRIgine (LAMICTAL) 100 MG tablet, 150 mg daily. , Disp: , Rfl:  .  levocetirizine (XYZAL) 5 MG tablet, Take 1 tablet (5 mg total) by mouth every evening., Disp: 90 tablet, Rfl: 1 .  meclizine (ANTIVERT) 25 MG tablet, Take 0.5-1 tablets (12.5-25 mg total) by mouth 3 (three) times daily as needed for dizziness., Disp: 20 tablet, Rfl: 0 .  triamcinolone (NASACORT) 55 MCG/ACT AERO nasal inhaler, Place 2 sprays into the nose daily., Disp: 1 Inhaler, Rfl: 12 .  amitriptyline (ELAVIL) 25 MG tablet, Take 1 tablet  (25 mg total) by mouth at bedtime., Disp: 30 tablet, Rfl: 1 .  benzonatate (TESSALON PERLES) 100 MG capsule, Take 2 capsules (200 mg total) by mouth 3 (three) times daily as needed for cough. (Patient not taking: Reported on 04/19/2019), Disp: 30 capsule, Rfl: 0 .  omeprazole (PRILOSEC) 40 MG capsule, Take 1 capsule (40 mg total) by mouth daily before breakfast., Disp: 30 capsule, Rfl: 1 .  promethazine-dextromethorphan (PROMETHAZINE-DM) 6.25-15 MG/5ML syrup, Take 2.5 mLs by mouth 4 (four) times daily as needed for cough. (Patient not taking: Reported on 04/19/2019), Disp: 118 mL, Rfl: 0 .  saline (AYR) GEL,  Place 1 application into the nose every 6 (six) hours as needed., Disp: 14.1 g, Rfl: 6  Allergies  Allergen Reactions  . Other     Must have anti-emetic prior to being given any narcotics   . Latex Rash    I personally reviewed active problem list, medication list, allergies, notes from last encounter, lab results with the patient/caregiver today.   ROS Ten systems reviewed and is negative except as mentioned in HPI.  Objective  Virtual encounter, vitals not obtained.  There is no height or weight on file to calculate BMI.  Physical Exam Constitutional: Patient appears well-developed and well-nourished. No distress.  HENT: Head: Normocephalic and atraumatic.  Neck: Normal range of motion. Pulmonary/Chest: Effort normal. No respiratory distress. Speaking in complete sentences Neurological: Pt is alert and oriented to person, place, and time. Coordination, speech and gait are normal.  Psychiatric: Patient has a normal mood and affect. behavior is normal. Judgment and thought content normal.  No results found for this or any previous visit (from the past 72 hour(s)).  PHQ2/9: [facility] screen [facility] 04/19/2019 03/07/2019 02/23/2019 01/30/2019 12/14/2018  Decreased Interest 1 0 0 1 0  Down, Depressed, Hopeless 1 1 3 1 1   PHQ - 2 Score 2 1 3 2 1   Altered sleeping 0 0 0 0 0  Tired,  decreased energy 1 0 0 1 1  Change in appetite 0 0 0 1 0  Feeling bad or failure about yourself  1 1 0 0 0  Trouble concentrating 0 0 0 0 0  Moving slowly or fidgety/restless 0 0 0 0 0  Suicidal thoughts 0 0 0 0 0  PHQ-9 Score 4 2 3 4 2   Difficult doing work/chores Not difficult at all Not difficult at all Somewhat difficult Not difficult at all Not difficult at all  Some recent data might be hidden   PHQ-2/9 Result is positive.    Fall Risk: Fall Risk  04/19/2019 03/07/2019 02/23/2019 01/30/2019 12/14/2018  Falls in the past year? 0 0 0 1 1  Number falls in past yr: 0 0 0 1 0  Injury with Fall? 0 0 0 1 1  Risk for fall due to : - - - History of fall(s) History of fall(s)  Follow up Falls evaluation completed - - Falls evaluation completed -    Assessment & Plan  1. Cough - azithromycin (ZITHROMAX) 250 MG tablet; Day 1: Take 2 tablets.  Days 2-5: Take 1 tablet once daily.  Dispense: 6 tablet; Refill: 0 - predniSONE (STERAPRED UNI-PAK 21 TAB) 10 MG (21) TBPK tablet; Take as directed  Dispense: 21 tablet; Refill: 0  2. Moderate persistent asthma without complication - Will treat for exacerbation today as symptoms are ongoing for 2 weeks now. - azithromycin (ZITHROMAX) 250 MG tablet; Day 1: Take 2 tablets.  Days 2-5: Take 1 tablet once daily.  Dispense: 6 tablet; Refill: 0 - predniSONE (STERAPRED UNI-PAK 21 TAB) 10 MG (21) TBPK tablet; Take as directed  Dispense: 21 tablet; Refill: 0  3. PCOS (polycystic ovarian syndrome) - traMADol (ULTRAM) 50 MG tablet; Take 0.5-1 tablets (25-50 mg total) by mouth every 12 (twelve) hours as needed for moderate pain or severe pain.  Dispense: 15 tablet; Refill: 0 - Ambulatory referral to Obstetrics / Gynecology  4. History of endometriosis - traMADol (ULTRAM) 50 MG tablet; Take 0.5-1 tablets (25-50 mg total) by mouth every 12 (twelve) hours as needed for moderate pain or severe pain.  Dispense: 15 tablet;  Refill: 0 - Ambulatory referral to Obstetrics /  Gynecology  5. Dysmenorrhea - traMADol (ULTRAM) 50 MG tablet; Take 0.5-1 tablets (25-50 mg total) by mouth every 12 (twelve) hours as needed for moderate pain or severe pain.  Dispense: 15 tablet; Refill: 0 - Ambulatory referral to Obstetrics / Gynecology   I discussed the assessment and treatment plan with the patient. The patient was provided an opportunity to ask questions and all were answered. The patient agreed with the plan and demonstrated an understanding of the instructions.  The patient was advised to call back or seek an in-person evaluation if the symptoms worsen or if the condition fails to improve as anticipated.  I provided 18 minutes of non-face-to-face time during this encounter.

## 2019-04-19 NOTE — Telephone Encounter (Signed)
Done

## 2019-04-19 NOTE — Telephone Encounter (Signed)
Pt will address with Raquel Sarna has virtual today

## 2019-04-19 NOTE — Telephone Encounter (Signed)
Pt called in and wanted to know if this letter is ready today for her husband to come pick up? Pt needs letter today other wise they will lose a whole week of pay  Best number  [phone]

## 2019-05-25 DIAGNOSIS — E282 Polycystic ovarian syndrome: Secondary | ICD-10-CM | POA: Diagnosis not present

## 2019-05-25 DIAGNOSIS — Z7689 Persons encountering health services in other specified circumstances: Secondary | ICD-10-CM | POA: Diagnosis not present

## 2019-05-25 DIAGNOSIS — R1031 Right lower quadrant pain: Secondary | ICD-10-CM | POA: Diagnosis not present

## 2019-05-25 DIAGNOSIS — N809 Endometriosis, unspecified: Secondary | ICD-10-CM | POA: Diagnosis not present

## 2019-06-05 DIAGNOSIS — F3132 Bipolar disorder, current episode depressed, moderate: Secondary | ICD-10-CM | POA: Diagnosis not present

## 2019-06-08 DIAGNOSIS — M9900 Segmental and somatic dysfunction of head region: Secondary | ICD-10-CM | POA: Diagnosis not present

## 2019-06-08 DIAGNOSIS — R42 Dizziness and giddiness: Secondary | ICD-10-CM | POA: Diagnosis not present

## 2019-06-08 DIAGNOSIS — M9901 Segmental and somatic dysfunction of cervical region: Secondary | ICD-10-CM | POA: Diagnosis not present

## 2019-06-08 DIAGNOSIS — M530 Cervicocranial syndrome: Secondary | ICD-10-CM | POA: Diagnosis not present

## 2019-06-12 ENCOUNTER — Other Ambulatory Visit: Payer: Self-pay

## 2019-06-12 ENCOUNTER — Ambulatory Visit: Payer: BLUE CROSS/BLUE SHIELD | Admitting: Family Medicine

## 2019-06-22 DIAGNOSIS — M530 Cervicocranial syndrome: Secondary | ICD-10-CM | POA: Diagnosis not present

## 2019-06-22 DIAGNOSIS — R42 Dizziness and giddiness: Secondary | ICD-10-CM | POA: Diagnosis not present

## 2019-06-22 DIAGNOSIS — M9901 Segmental and somatic dysfunction of cervical region: Secondary | ICD-10-CM | POA: Diagnosis not present

## 2019-06-22 DIAGNOSIS — M9900 Segmental and somatic dysfunction of head region: Secondary | ICD-10-CM | POA: Diagnosis not present

## 2019-06-23 ENCOUNTER — Other Ambulatory Visit: Payer: Self-pay

## 2019-06-23 ENCOUNTER — Encounter: Payer: Self-pay | Admitting: Family Medicine

## 2019-06-23 ENCOUNTER — Ambulatory Visit (INDEPENDENT_AMBULATORY_CARE_PROVIDER_SITE_OTHER): Payer: BC Managed Care – PPO | Admitting: Family Medicine

## 2019-06-23 VITALS — BP 110/82 | HR 98 | Temp 97.9°F | Resp 16 | Ht 61.0 in | Wt 200.5 lb

## 2019-06-23 DIAGNOSIS — M25371 Other instability, right ankle: Secondary | ICD-10-CM

## 2019-06-23 DIAGNOSIS — J4521 Mild intermittent asthma with (acute) exacerbation: Secondary | ICD-10-CM

## 2019-06-23 DIAGNOSIS — G43009 Migraine without aura, not intractable, without status migrainosus: Secondary | ICD-10-CM

## 2019-06-23 MED ORDER — PROMETHAZINE HCL 12.5 MG PO TABS: 12.5000 mg | ORAL_TABLET | Freq: Three times a day (TID) | ORAL | 0 refills | Status: DC | PRN

## 2019-06-23 MED ORDER — PREDNISONE 20 MG PO TABS: 20.0000 mg | ORAL_TABLET | Freq: Two times a day (BID) | ORAL | 0 refills | Status: DC

## 2019-06-23 MED ORDER — SUMATRIPTAN SUCCINATE 25 MG PO TABS: 25.0000 mg | ORAL_TABLET | ORAL | 0 refills | Status: DC | PRN

## 2019-06-23 MED ORDER — BUDESONIDE-FORMOTEROL FUMARATE 80-4.5 MCG/ACT IN AERO: 2.0000 | INHALATION_SPRAY | Freq: Two times a day (BID) | RESPIRATORY_TRACT | 3 refills | Status: DC

## 2019-06-23 MED ORDER — AMITRIPTYLINE HCL 25 MG PO TABS: 25.0000 mg | ORAL_TABLET | Freq: Every day | ORAL | 1 refills | Status: DC

## 2019-06-23 NOTE — Addendum Note (Signed)
Addended by: Hubbard Hartshorn on: 06/23/2019 11:16 AM   Modules accepted: Orders

## 2019-06-23 NOTE — Patient Instructions (Addendum)
Please restart elavil (amytriptyline)  DO NOT take Imitrex for your current migraine.  Prednisone:Take 1 tablet prednisone with lunch and another dose 2 hours later if headache not improving.  If not improving by tomorrow morning, take 1 tablet with breakfast and 1 tablet with lunch.  Today when you are able to rest: Take 1 tablet of phenergan, 12.5-25mg  benadryl, and 400mg  Ibuprofen at one time and then try to rest in a quiet area. - Drink plenty of water, eat regular meals throughout the day. - Work on stress relief.

## 2019-06-23 NOTE — Progress Notes (Signed)
Name: Janet Rich   MRN: [id number]    DOB: Dec 03, 1995   Date:06/23/2019       Progress Note  Subjective  Chief Complaint  Chief Complaint  Patient presents with  . Ankle Pain    right ankle, referral  . Migraine    HPI  Pt presents for acute visit for the following concerns:  Migraines: Current migraine started about a week ago.  She has been having a lot of stress lately - buying a house, in school, has a toddler.  She has been getting at least 3-4 migraines a month, has about 7 migraine days in a month.  Typical symptoms are in the front and lateral aspects of the head, with a lot of pressure, nausea sometimes vomiting, light and smell sensitivity; not so much sound sensitivity.  No aura. She is no longer taking her elavil by Dr. Allegra Lai.  Has never tried a triptan medication.    RIGHT Ankle Pain: She has been struggling with right ankle pain for years (since high school).  Was seeing [place] [facility] and lost follow up.  She has weakness and falls every now and then due to the ankle giving out.  She has brace on the right ankle today which does help with stability.   Patient Active Problem List   Diagnosis Date Noted  . Endometriosis 03/27/2019  . PCOS (polycystic ovarian syndrome) 03/27/2019  . Moderate persistent asthma 03/07/2019  . Post-nasal drainage 03/07/2019  . Irritable bowel syndrome with diarrhea 03/07/2019  . Gastroesophageal reflux disease without esophagitis 03/07/2019  . Class 2 obesity due to excess calories without serious comorbidity with body mass index (BMI) of 38.0 to 38.9 in adult 09/02/2018  . Bipolar 2 disorder (HCC) 08/12/2018  . History of low birth weight 03/11/2018  . Vulvar atrophy 03/11/2018  . Dysmenorrhea 05/01/2015  . History of migraine headaches 05/01/2015  . Anxiety and depression 05/01/2015    Past Surgical History:  Procedure Laterality Date  . ROBOTIC ASSISTED DIAGNOSTIC LAPAROSCOPY N/A 02/18/2017   Procedure: ROBOTIC ASSISTED DIAGNOSTIC  LAPAROSCOPY,EXCISION AND ABLATION OF ENDOMETRIOSIS,LYSIS OF ADHESIONS;  Surgeon: Olivia Mackie, MD;  Location: [facility];  Service: Gynecology;  Laterality: N/A;  . WISDOM TOOTH EXTRACTION  2013    Family History  Problem Relation Age of Onset  . Endometriosis Mother   . Bipolar disorder Mother   . Diabetes Mother        prediabetes  . Breast cancer Paternal Grandmother   . Cancer Paternal Grandmother   . Depression Paternal Grandmother   . Schizophrenia Father   . Depression Father   . Heart disease Neg Hx   . Colon cancer Neg Hx   . Ovarian cancer Neg Hx     Social History   Socioeconomic History  . Marital status: Married    Spouse name: Renae Fickle  . Number of children: 1  . Years of education: Not on file  . Highest education level: Not on file  Occupational History  . Occupation: Museum/gallery conservator  . Financial resource strain: Not hard at all  . Food insecurity    Worry: Never true    Inability: Never true  . Transportation needs    Medical: No    Non-medical: No  Tobacco Use  . Smoking status: Former Smoker    Types: Cigarettes    Quit date: 12/20/2009    Years since quitting: 9.5  . Smokeless tobacco: Never Used  . Tobacco comment: age [age], quit  Substance and  Sexual Activity  . Alcohol use: No    Frequency: Never    Comment: occassionally  . Drug use: No  . Sexual activity: Yes    Partners: Female, Female    Birth control/protection: None    Comment: last sex mid Jan 2019  Lifestyle  . Physical activity    Days per week: 0 days    Minutes per session: 0 min  . Stress: Not at all  Relationships  . Social connections    Talks on phone: More than three times a week    Gets together: More than three times a week    Attends religious service: Never    Active member of club or organization: No    Attends meetings of clubs or organizations: Never    Relationship status: Married  . Intimate partner violence    Fear of current or ex partner: No     Emotionally abused: No    Physically abused: No    Forced sexual activity: No  Other Topics Concern  . Not on file  Social History Narrative  . Not on file     Current Outpatient Medications:  .  acetaminophen (TYLENOL) 500 MG tablet, Take 500 mg by mouth every 6 (six) hours as needed for headache., Disp: , Rfl:  .  albuterol (PROVENTIL HFA;VENTOLIN HFA) 108 (90 Base) MCG/ACT inhaler, Inhale 1-2 puffs into the lungs every 6 (six) hours as needed for wheezing or shortness of breath., Disp: 1 Inhaler, Rfl: 0 .  albuterol (PROVENTIL) (2.5 MG/3ML) 0.083% nebulizer solution, Take 3 mLs (2.5 mg total) by nebulization every 6 (six) hours as needed for wheezing or shortness of breath., Disp: 150 mL, Rfl: 1 .  ALPRAZolam (XANAX) 0.25 MG tablet, Take 0.25 mg by mouth at bedtime as needed for anxiety., Disp: , Rfl:  .  budesonide-formoterol (SYMBICORT) 80-4.5 MCG/ACT inhaler, Inhale 2 puffs into the lungs 2 (two) times daily., Disp: 1 Inhaler, Rfl: 3 .  ibuprofen (ADVIL,MOTRIN) 800 MG tablet, Take 1 tablet (800 mg total) by mouth 3 (three) times daily., Disp: 30 tablet, Rfl: 0 .  lamoTRIgine (LAMICTAL) 100 MG tablet, 150 mg daily. , Disp: , Rfl:  .  levocetirizine (XYZAL) 5 MG tablet, Take 1 tablet (5 mg total) by mouth every evening., Disp: 90 tablet, Rfl: 1 .  meclizine (ANTIVERT) 25 MG tablet, Take 0.5-1 tablets (12.5-25 mg total) by mouth 3 (three) times daily as needed for dizziness., Disp: 20 tablet, Rfl: 0 .  saline (AYR) GEL, Place 1 application into the nose every 6 (six) hours as needed., Disp: 14.1 g, Rfl: 6 .  traMADol (ULTRAM) 50 MG tablet, Take 0.5-1 tablets (25-50 mg total) by mouth every 12 (twelve) hours as needed for moderate pain or severe pain., Disp: 15 tablet, Rfl: 0 .  triamcinolone (NASACORT) 55 MCG/ACT AERO nasal inhaler, Place 2 sprays into the nose daily., Disp: 1 Inhaler, Rfl: 12 .  amitriptyline (ELAVIL) 25 MG tablet, Take 1 tablet (25 mg total) by mouth at bedtime., Disp: 30  tablet, Rfl: 1 .  azithromycin (ZITHROMAX) 250 MG tablet, Day 1: Take 2 tablets.  Days 2-5: Take 1 tablet once daily. (Patient not taking: Reported on 06/23/2019), Disp: 6 tablet, Rfl: 0 .  omeprazole (PRILOSEC) 40 MG capsule, Take 1 capsule (40 mg total) by mouth daily before breakfast., Disp: 30 capsule, Rfl: 1 .  predniSONE (STERAPRED UNI-PAK 21 TAB) 10 MG (21) TBPK tablet, Take as directed (Patient not taking: Reported on 06/23/2019), Disp: 21 tablet,  Rfl: 0  Allergies  Allergen Reactions  . Other     Must have anti-emetic prior to being given any narcotics   . Latex Rash    I personally reviewed active problem list, medication list, allergies, notes from last encounter, lab results with the patient/caregiver today.   ROS  Constitutional: Negative for fever or weight change.  Respiratory: Negative for cough and shortness of breath.   Cardiovascular: Negative for chest pain or palpitations.  Gastrointestinal: Negative for abdominal pain, no bowel changes.  Musculoskeletal: See HPI Skin: Negative for rash.  Neurological: Negative for dizziness; See HPI No other specific complaints in a complete review of systems (except as listed in HPI above).  Objective  Vitals:   06/23/19 1046  BP: 110/82  Pulse: 98  Resp: 16  Temp: 97.9 F (36.6 C)  TempSrc: Oral  SpO2: 99%  Weight: 200 lb 8 oz (90.9 kg)  Height: 5\' 1"  (1.549 m)    Body mass index is 37.88 kg/m.  Physical Exam  Constitutional: Patient appears well-developed and well-nourished. No distress.  HENT: Head: Normocephalic and atraumatic. Eyes: Conjunctivae and EOM are normal. No scleral icterus.   Neck: Normal range of motion. Neck supple. No JVD present. No thyromegaly present.  Cardiovascular: Normal rate, regular rhythm and normal heart sounds.  No murmur heard. No BLE edema. Pulmonary/Chest: Effort normal and breath sounds normal. No respiratory distress. Musculoskeletal: Normal range of motion, no joint effusions.  No gross deformities.  There is some laxity to the lateral aspect of the right ankle Neurological: Pt is alert and oriented to person, place, and time. No cranial nerve deficit. Coordination, balance, strength, speech and gait are normal.  Skin: Skin is warm and dry. No rash noted. No erythema.  Psychiatric: Patient has a normal mood and affect. behavior is normal. Judgment and thought content normal.  No results found for this or any previous visit (from the past 72 hour(s)).   PHQ2/9: Depression screen [facility] 2/9 06/23/2019 04/19/2019 03/07/2019 02/23/2019 01/30/2019  Decreased Interest 0 1 0 0 1  Down, Depressed, Hopeless 0 1 1 3 1   PHQ - 2 Score 0 2 1 3 2   Altered sleeping 0 0 0 0 0  Tired, decreased energy 0 1 0 0 1  Change in appetite 0 0 0 0 1  Feeling bad or failure about yourself  0 1 1 0 0  Trouble concentrating 0 0 0 0 0  Moving slowly or fidgety/restless 0 0 0 0 0  Suicidal thoughts 0 0 0 0 0  PHQ-9 Score 0 4 2 3 4   Difficult doing work/chores Somewhat difficult Not difficult at all Not difficult at all Somewhat difficult Not difficult at all  Some recent data might be hidden   PHQ-2/9 Result is negative.    Fall Risk: Fall Risk  06/23/2019 04/19/2019 03/07/2019 02/23/2019 01/30/2019  Falls in the past year? 1 0 0 0 1  Number falls in past yr: 1 0 0 0 1  Injury with Fall? 0 0 0 0 1  Risk for fall due to : - - - - History of fall(s)  Follow up Falls evaluation completed Falls evaluation completed - - Falls evaluation completed   Assessment & Plan   1. Migraine without aura and without status migrainosus, not intractable - amitriptyline (ELAVIL) 25 MG tablet; Take 1 tablet (25 mg total) by mouth at bedtime.  Dispense: 30 tablet; Refill: 1 - promethazine (PHENERGAN) 12.5 MG tablet; Take 1 tablet (12.5 mg total)  by mouth every 8 (eight) hours as needed for nausea or vomiting.  Dispense: 20 tablet; Refill: 0 - predniSONE (DELTASONE) 20 MG tablet; Take 1 tablet (20 mg total) by mouth 2  (two) times daily with a meal.  Dispense: 4 tablet; Refill: 0 - SUMAtriptan (IMITREX) 25 MG tablet; Take 1 tablet (25 mg total) by mouth every 2 (two) hours as needed for migraine. May repeat in 2 hours if headache persists or recurs.  Dispense: 10 tablet; Refill: 0  3. Right ankle instability - Ambulatory referral to Orthopedics

## 2019-07-03 DIAGNOSIS — S93401A Sprain of unspecified ligament of right ankle, initial encounter: Secondary | ICD-10-CM | POA: Diagnosis not present

## 2019-07-05 ENCOUNTER — Ambulatory Visit (INDEPENDENT_AMBULATORY_CARE_PROVIDER_SITE_OTHER): Payer: BC Managed Care – PPO | Admitting: Family Medicine

## 2019-07-05 ENCOUNTER — Encounter: Payer: Self-pay | Admitting: Family Medicine

## 2019-07-05 ENCOUNTER — Other Ambulatory Visit: Payer: Self-pay

## 2019-07-05 VITALS — Ht 61.0 in | Wt 206.0 lb

## 2019-07-05 DIAGNOSIS — H60331 Swimmer's ear, right ear: Secondary | ICD-10-CM

## 2019-07-05 DIAGNOSIS — H547 Unspecified visual loss: Secondary | ICD-10-CM

## 2019-07-05 MED ORDER — CIPROFLOXACIN-DEXAMETHASONE 0.3-0.1 % OT SUSP: 4.0000 [drp] | Freq: Two times a day (BID) | OTIC | 0 refills | Status: AC

## 2019-07-05 NOTE — Progress Notes (Signed)
Name: Janet Rich   MRN: [id number]    DOB: 1996-07-02   Date:07/05/2019       Progress Note  Subjective  Chief Complaint  Chief Complaint  Patient presents with  . Ear Pain    Onset-3 days, Right Ear, Painful, decreased hearing.    I connected with  Janet Planaylor Saling  on 07/05/19 at  9:20 AM EDT by a video enabled telemedicine application and verified that I am speaking with the correct person using two identifiers.  I discussed the limitations of evaluation and management by telemedicine and the availability of in person appointments. The patient expressed understanding and agreed to proceed. Staff also discussed with the patient that there may be a patient responsible charge related to this service. Patient Location: Home Provider Location: Home Additional Individuals present: None  HPI  Pt presents with concern for otalgia.  She notes that the ear canal and just outside are quite painful, red, and is somewhat tender; there is a small amount of swelling.  Denies nasal congestion, sore throat, cough, shortness of breath, fever.  She also requests optometry referral because she has noticed her vision has been decreasing over the last few years; has not had any new glasses in several years.  We will refer today.  She denies any sudden vision changes recently.   Patient Active Problem List   Diagnosis Date Noted  . Endometriosis 03/27/2019  . PCOS (polycystic ovarian syndrome) 03/27/2019  . Moderate persistent asthma 03/07/2019  . Post-nasal drainage 03/07/2019  . Irritable bowel syndrome with diarrhea 03/07/2019  . Gastroesophageal reflux disease without esophagitis 03/07/2019  . Class 2 obesity due to excess calories without serious comorbidity with body mass index (BMI) of 38.0 to 38.9 in adult 09/02/2018  . Bipolar 2 disorder (HCC) 08/12/2018  . History of low birth weight 03/11/2018  . Vulvar atrophy 03/11/2018  . Dysmenorrhea 05/01/2015  . History of migraine headaches  05/01/2015  . Anxiety and depression 05/01/2015    Social History   Tobacco Use  . Smoking status: Former Smoker    Types: Cigarettes    Quit date: 12/20/2009    Years since quitting: 9.5  . Smokeless tobacco: Never Used  . Tobacco comment: age [age], quit  Substance Use Topics  . Alcohol use: No    Frequency: Never    Comment: occassionally     Current Outpatient Medications:  .  acetaminophen (TYLENOL) 500 MG tablet, Take 500 mg by mouth every 6 (six) hours as needed for headache., Disp: , Rfl:  .  albuterol (PROVENTIL HFA;VENTOLIN HFA) 108 (90 Base) MCG/ACT inhaler, Inhale 1-2 puffs into the lungs every 6 (six) hours as needed for wheezing or shortness of breath., Disp: 1 Inhaler, Rfl: 0 .  albuterol (PROVENTIL) (2.5 MG/3ML) 0.083% nebulizer solution, Take 3 mLs (2.5 mg total) by nebulization every 6 (six) hours as needed for wheezing or shortness of breath., Disp: 150 mL, Rfl: 1 .  ALPRAZolam (XANAX) 0.25 MG tablet, Take 0.25 mg by mouth at bedtime as needed for anxiety., Disp: , Rfl:  .  amitriptyline (ELAVIL) 25 MG tablet, Take 1 tablet (25 mg total) by mouth at bedtime., Disp: 30 tablet, Rfl: 1 .  budesonide-formoterol (SYMBICORT) 80-4.5 MCG/ACT inhaler, Inhale 2 puffs into the lungs 2 (two) times daily., Disp: 1 Inhaler, Rfl: 3 .  ibuprofen (ADVIL,MOTRIN) 800 MG tablet, Take 1 tablet (800 mg total) by mouth 3 (three) times daily., Disp: 30 tablet, Rfl: 0 .  lamoTRIgine (LAMICTAL) 100 MG tablet,  200 mg daily. , Disp: , Rfl:  .  meclizine (ANTIVERT) 25 MG tablet, Take 0.5-1 tablets (12.5-25 mg total) by mouth 3 (three) times daily as needed for dizziness., Disp: 20 tablet, Rfl: 0 .  saline (AYR) GEL, Place 1 application into the nose every 6 (six) hours as needed., Disp: 14.1 g, Rfl: 6 .  SUMAtriptan (IMITREX) 25 MG tablet, Take 1 tablet (25 mg total) by mouth every 2 (two) hours as needed for migraine. May repeat in 2 hours if headache persists or recurs., Disp: 10 tablet, Rfl: 0  .  traMADol (ULTRAM) 50 MG tablet, Take 0.5-1 tablets (25-50 mg total) by mouth every 12 (twelve) hours as needed for moderate pain or severe pain., Disp: 15 tablet, Rfl: 0 .  levocetirizine (XYZAL) 5 MG tablet, Take 1 tablet (5 mg total) by mouth every evening. (Patient not taking: Reported on 07/05/2019), Disp: 90 tablet, Rfl: 1 .  omeprazole (PRILOSEC) 40 MG capsule, Take 1 capsule (40 mg total) by mouth daily before breakfast., Disp: 30 capsule, Rfl: 1 .  predniSONE (DELTASONE) 20 MG tablet, Take 1 tablet (20 mg total) by mouth 2 (two) times daily with a meal., Disp: 4 tablet, Rfl: 0 .  promethazine (PHENERGAN) 12.5 MG tablet, Take 1 tablet (12.5 mg total) by mouth every 8 (eight) hours as needed for nausea or vomiting. (Patient not taking: Reported on 07/05/2019), Disp: 20 tablet, Rfl: 0 .  triamcinolone (NASACORT) 55 MCG/ACT AERO nasal inhaler, Place 2 sprays into the nose daily. (Patient not taking: Reported on 07/05/2019), Disp: 1 Inhaler, Rfl: 12  Allergies  Allergen Reactions  . Other     Must have anti-emetic prior to being given any narcotics   . Latex Rash    I personally reviewed active problem list, medication list, allergies with the patient/caregiver today.  ROS  Ten systems reviewed and is negative except as mentioned in HPI  Objective  Virtual encounter, vitals not obtained.  Body mass index is 38.92 kg/m.  Nursing Note and Vital Signs reviewed.  Physical Exam  Constitutional: Patient appears well-developed and well-nourished. No distress.  HENT: Head: Normocephalic and atraumatic. There is no mastoid edema or erythema. Neck: Normal range of motion. Pulmonary/Chest: Effort normal. No respiratory distress. Speaking in complete sentences Neurological: Pt is alert and oriented to person, place, and time. Coordination, speech and gait are normal.  Psychiatric: Patient has a normal mood and affect. behavior is normal. Judgment and thought content normal.  No results  found for this or any previous visit (from the past 72 hour(s)).  Assessment & Plan  1. Acute swimmer's ear of right side - ciprofloxacin-dexamethasone (CIPRODEX) OTIC suspension; Place 4 drops into the right ear 2 (two) times daily for 7 days.  Dispense: 7.5 mL; Refill: 0  2. Decreased visual acuity - Ambulatory referral to Optometry  -Red flags and when to present for emergency care or RTC including fever >101.32F, chest pain, shortness of breath, new/worsening/un-resolving symptoms, reviewed with patient at time of visit. Follow up and care instructions discussed and provided in AVS. - I discussed the assessment and treatment plan with the patient. The patient was provided an opportunity to ask questions and all were answered. The patient agreed with the plan and demonstrated an understanding of the instructions.  I provided 10 minutes of non-face-to-face time during this encounter.  Hubbard Hartshorn, FNP

## 2019-07-07 ENCOUNTER — Other Ambulatory Visit: Payer: Self-pay | Admitting: Physician Assistant

## 2019-07-07 DIAGNOSIS — S93401A Sprain of unspecified ligament of right ankle, initial encounter: Secondary | ICD-10-CM

## 2019-07-11 DIAGNOSIS — N809 Endometriosis, unspecified: Secondary | ICD-10-CM | POA: Diagnosis not present

## 2019-07-11 DIAGNOSIS — Z01419 Encounter for gynecological examination (general) (routine) without abnormal findings: Secondary | ICD-10-CM | POA: Diagnosis not present

## 2019-07-16 ENCOUNTER — Ambulatory Visit
Admission: RE | Admit: 2019-07-16 | Discharge: 2019-07-16 | Disposition: A | Discharge status: Home / Self Care | Payer: BC Managed Care – PPO | Source: Ambulatory Visit | Attending: Physician Assistant | Admitting: Physician Assistant

## 2019-07-16 DIAGNOSIS — M25571 Pain in right ankle and joints of right foot: Secondary | ICD-10-CM | POA: Diagnosis not present

## 2019-07-16 DIAGNOSIS — S93401A Sprain of unspecified ligament of right ankle, initial encounter: Secondary | ICD-10-CM | POA: Diagnosis not present

## 2019-07-16 DIAGNOSIS — S93491A Sprain of other ligament of right ankle, initial encounter: Secondary | ICD-10-CM | POA: Diagnosis not present

## 2019-07-25 DIAGNOSIS — M25371 Other instability, right ankle: Secondary | ICD-10-CM | POA: Diagnosis not present

## 2019-07-25 DIAGNOSIS — M65871 Other synovitis and tenosynovitis, right ankle and foot: Secondary | ICD-10-CM | POA: Diagnosis not present

## 2019-07-25 DIAGNOSIS — S93491S Sprain of other ligament of right ankle, sequela: Secondary | ICD-10-CM | POA: Diagnosis not present

## 2019-08-03 DIAGNOSIS — M9900 Segmental and somatic dysfunction of head region: Secondary | ICD-10-CM | POA: Diagnosis not present

## 2019-08-03 DIAGNOSIS — R42 Dizziness and giddiness: Secondary | ICD-10-CM | POA: Diagnosis not present

## 2019-08-03 DIAGNOSIS — M530 Cervicocranial syndrome: Secondary | ICD-10-CM | POA: Diagnosis not present

## 2019-08-03 DIAGNOSIS — M9901 Segmental and somatic dysfunction of cervical region: Secondary | ICD-10-CM | POA: Diagnosis not present

## 2019-08-07 DIAGNOSIS — Z1159 Encounter for screening for other viral diseases: Secondary | ICD-10-CM | POA: Diagnosis not present

## 2019-08-08 DIAGNOSIS — Z01818 Encounter for other preprocedural examination: Secondary | ICD-10-CM | POA: Diagnosis not present

## 2019-08-08 DIAGNOSIS — S93491S Sprain of other ligament of right ankle, sequela: Secondary | ICD-10-CM | POA: Diagnosis not present

## 2019-08-08 DIAGNOSIS — M25371 Other instability, right ankle: Secondary | ICD-10-CM | POA: Diagnosis not present

## 2019-08-11 DIAGNOSIS — G8918 Other acute postprocedural pain: Secondary | ICD-10-CM | POA: Diagnosis not present

## 2019-08-11 DIAGNOSIS — M7671 Peroneal tendinitis, right leg: Secondary | ICD-10-CM | POA: Diagnosis not present

## 2019-08-11 DIAGNOSIS — S86311A Strain of muscle(s) and tendon(s) of peroneal muscle group at lower leg level, right leg, initial encounter: Secondary | ICD-10-CM | POA: Diagnosis not present

## 2019-08-11 DIAGNOSIS — S93491S Sprain of other ligament of right ankle, sequela: Secondary | ICD-10-CM | POA: Diagnosis not present

## 2019-08-11 DIAGNOSIS — M65871 Other synovitis and tenosynovitis, right ankle and foot: Secondary | ICD-10-CM | POA: Diagnosis not present

## 2019-08-11 DIAGNOSIS — M25371 Other instability, right ankle: Secondary | ICD-10-CM | POA: Diagnosis not present

## 2019-08-11 DIAGNOSIS — M659 Synovitis and tenosynovitis, unspecified: Secondary | ICD-10-CM | POA: Diagnosis not present

## 2019-08-11 DIAGNOSIS — M6289 Other specified disorders of muscle: Secondary | ICD-10-CM | POA: Diagnosis not present

## 2019-08-11 DIAGNOSIS — M25571 Pain in right ankle and joints of right foot: Secondary | ICD-10-CM | POA: Diagnosis not present

## 2019-08-11 DIAGNOSIS — M948X7 Other specified disorders of cartilage, ankle and foot: Secondary | ICD-10-CM | POA: Diagnosis not present

## 2019-08-22 DIAGNOSIS — M79671 Pain in right foot: Secondary | ICD-10-CM | POA: Diagnosis not present

## 2019-08-25 DIAGNOSIS — M79671 Pain in right foot: Secondary | ICD-10-CM | POA: Diagnosis not present

## 2019-09-04 DIAGNOSIS — S92254A Nondisplaced fracture of navicular [scaphoid] of right foot, initial encounter for closed fracture: Secondary | ICD-10-CM | POA: Diagnosis not present

## 2019-09-04 DIAGNOSIS — S92254D Nondisplaced fracture of navicular [scaphoid] of right foot, subsequent encounter for fracture with routine healing: Secondary | ICD-10-CM | POA: Diagnosis not present

## 2019-09-04 DIAGNOSIS — M25371 Other instability, right ankle: Secondary | ICD-10-CM | POA: Diagnosis not present

## 2019-09-06 DIAGNOSIS — S92254D Nondisplaced fracture of navicular [scaphoid] of right foot, subsequent encounter for fracture with routine healing: Secondary | ICD-10-CM | POA: Diagnosis not present

## 2019-09-06 DIAGNOSIS — M25371 Other instability, right ankle: Secondary | ICD-10-CM | POA: Diagnosis not present

## 2019-09-20 ENCOUNTER — Other Ambulatory Visit
Admission: RE | Admit: 2019-09-20 | Discharge: 2019-09-20 | Disposition: A | Discharge status: Home / Self Care | Payer: BC Managed Care – PPO | Source: Ambulatory Visit | Attending: Obstetrics & Gynecology | Admitting: Obstetrics & Gynecology

## 2019-09-20 ENCOUNTER — Other Ambulatory Visit: Payer: Self-pay

## 2019-09-20 DIAGNOSIS — Z20828 Contact with and (suspected) exposure to other viral communicable diseases: Secondary | ICD-10-CM | POA: Insufficient documentation

## 2019-09-20 DIAGNOSIS — Z01812 Encounter for preprocedural laboratory examination: Secondary | ICD-10-CM | POA: Diagnosis not present

## 2019-09-20 DIAGNOSIS — N938 Other specified abnormal uterine and vaginal bleeding: Secondary | ICD-10-CM | POA: Diagnosis not present

## 2019-09-20 DIAGNOSIS — R102 Pelvic and perineal pain: Secondary | ICD-10-CM | POA: Diagnosis not present

## 2019-09-20 DIAGNOSIS — R9389 Abnormal findings on diagnostic imaging of other specified body structures: Secondary | ICD-10-CM | POA: Diagnosis not present

## 2019-09-20 LAB — SARS CORONAVIRUS 2 (TAT 6-24 HRS): SARS Coronavirus 2: NEGATIVE

## 2019-09-21 NOTE — H&P (Signed)
Preoperative History and Physical  Janet Rich is a 23 y.o. G1P1001 here for surgical management of pelvic pain with abnormal vaginal bleeding and thickened endometrium.   No significant preoperative concerns.  Proposed surgery: dilation and curettage, hysteroscopy  Past Medical History:  Diagnosis Date  . Anemia    with periods  . Ankle fracture    right  . Anxiety   . Asthma   . Back pain   . Depression   . Endometriosis   . GERD (gastroesophageal reflux disease)   . Headache   . Ovarian cyst   . PCOS (polycystic ovarian syndrome)   . Pneumonia    age 48  . PONV (postoperative nausea and vomiting)    nausea after wisdom teeth extraction  . Vaginal Pap smear, abnormal    bx ok   Past Surgical History:  Procedure Laterality Date  . FRACTURE SURGERY     ankle surgery with impant   . ROBOTIC ASSISTED DIAGNOSTIC LAPAROSCOPY N/A 02/18/2017   Procedure: ROBOTIC ASSISTED DIAGNOSTIC LAPAROSCOPY,EXCISION AND ABLATION OF ENDOMETRIOSIS,LYSIS OF ADHESIONS;  Surgeon: Brien Few, MD;  Location: [facility];  Service: Gynecology;  Laterality: N/A;  . WISDOM TOOTH EXTRACTION  2013   OB History  Gravida Para Term Preterm AB Living  1 1 1  0 0 1  SAB TAB Ectopic Multiple Live Births  0 0 0 0 1    # Outcome Date GA Lbr Len/2nd Weight Sex Delivery Anes PTL Lv  1 Term 02/12/18 [redacted]w[redacted]d 15:03 / 00:24 2240 g M Vag-Vacuum EPI  LIV  Patient denies any other pertinent gynecologic issues.   No current facility-administered medications on file prior to encounter.    Current Outpatient Medications on File Prior to Encounter  Medication Sig Dispense Refill  . acetaminophen (TYLENOL) 500 MG tablet Take 500 mg by mouth every 6 (six) hours as needed for headache.    . albuterol (PROVENTIL HFA;VENTOLIN HFA) 108 (90 Base) MCG/ACT inhaler Inhale 1-2 puffs into the lungs every 6 (six) hours as needed for wheezing or shortness of breath. 1 Inhaler 0  . albuterol (PROVENTIL) (2.5 MG/3ML) 0.083% nebulizer  solution Take 3 mLs (2.5 mg total) by nebulization every 6 (six) hours as needed for wheezing or shortness of breath. 150 mL 1  . ALPRAZolam (XANAX) 0.25 MG tablet Take 0.25 mg by mouth at bedtime as needed for anxiety.    . budesonide-formoterol (SYMBICORT) 80-4.5 MCG/ACT inhaler Inhale 2 puffs into the lungs 2 (two) times daily. 1 Inhaler 3  . ibuprofen (ADVIL,MOTRIN) 800 MG tablet Take 1 tablet (800 mg total) by mouth 3 (three) times daily. (Patient taking differently: Take 800 mg by mouth every 8 (eight) hours as needed for moderate pain. ) 30 tablet 0  . lamoTRIgine (LAMICTAL) 200 MG tablet Take 200 mg by mouth daily.     . Prenatal Vit-Fe Fumarate-FA (PRENATAL MULTIVITAMIN) TABS tablet Take 2 tablets by mouth daily.    . traMADol (ULTRAM) 50 MG tablet Take 0.5-1 tablets (25-50 mg total) by mouth every 12 (twelve) hours as needed for moderate pain or severe pain. 15 tablet 0  . vitamin C (ASCORBIC ACID) 500 MG tablet Take 500 mg by mouth daily.     Allergies  Allergen Reactions  . Other     Must have anti-emetic prior to being given any narcotics   . Latex Rash    Social History:   reports that she quit smoking about 9 years ago. Her smoking use included cigarettes. She has never used  smokeless tobacco. She reports that she does not drink alcohol or use drugs.  Family History  Problem Relation Age of Onset  . Endometriosis Mother   . Bipolar disorder Mother   . Diabetes Mother        prediabetes  . Breast cancer Paternal Grandmother   . Cancer Paternal Grandmother   . Depression Paternal Grandmother   . Schizophrenia Father   . Depression Father   . Heart disease Neg Hx   . Colon cancer Neg Hx   . Ovarian cancer Neg Hx     Review of Systems: Noncontributory  PHYSICAL EXAM: Blood pressure 122/80, pulse 84, temperature (!) 97.2 F (36.2 C), temperature source Temporal, resp. rate 16, height 5\' 1"  (1.549 m), weight 94.3 kg, last menstrual period 09/08/2019, SpO2 99 %, not  currently breastfeeding. General appearance - alert, well appearing, and in no distress Chest - clear to auscultation, no wheezes, rales or rhonchi, symmetric air entry Heart - normal rate and regular rhythm Abdomen - soft, nontender, nondistended, no masses or organomegaly Pelvic - examination not indicated Extremities - peripheral pulses normal, no pedal edema, no clubbing or cyanosis  Labs: Results for orders placed or performed during the hospital encounter of 09/22/19 (from the past 336 hour(s))  Pregnancy, urine POC   Collection Time: 09/22/19 11:59 AM  Result Value Ref Range   Preg Test, Ur NEGATIVE NEGATIVE  CBC   Collection Time: 09/22/19 12:05 PM  Result Value Ref Range   WBC 8.4 4.0 - 10.5 K/uL   RBC 5.06 3.87 - 5.11 MIL/uL   Hemoglobin 13.1 12.0 - 15.0 g/dL   HCT 14/04/20 29.5 - 62.1 %   MCV 78.5 (L) 80.0 - 100.0 fL   MCH 25.9 (L) 26.0 - 34.0 pg   MCHC 33.0 30.0 - 36.0 g/dL   RDW 30.8 65.7 - 84.6 %   Platelets 384 150 - 400 K/uL   nRBC 0.0 0.0 - 0.2 %  Basic metabolic panel   Collection Time: 09/22/19 12:05 PM  Result Value Ref Range   Sodium 137 135 - 145 mmol/L   Potassium 4.0 3.5 - 5.1 mmol/L   Chloride 103 98 - 111 mmol/L   CO2 21 (L) 22 - 32 mmol/L   Glucose, Bld 95 70 - 99 mg/dL   BUN 13 6 - 20 mg/dL   Creatinine, Ser 14/04/20 0.44 - 1.00 mg/dL   Calcium 9.2 8.9 - 9.52 mg/dL   GFR calc non Af Amer >60 >60 mL/min   GFR calc Af Amer >60 >60 mL/min   Anion gap 13 5 - 15  Type and screen [facility]   Collection Time: 09/22/19 12:05 PM  Result Value Ref Range   ABO/RH(D) A POS    Antibody Screen NEG    Sample Expiration      09/25/2019,2359 Performed at [facility], [address] [place], [place]   Results for orders placed or performed during the hospital encounter of 09/20/19 (from the past 336 hour(s))  SARS CORONAVIRUS 2 (TAT 6-24 HRS) Nasopharyngeal Nasopharyngeal Swab   Collection Time: 09/20/19  2:34 PM    Specimen: Nasopharyngeal Swab  Result Value Ref Range   SARS Coronavirus 2 NEGATIVE NEGATIVE    Imaging Studies: No results found.  Assessment: Patient Active Problem List   Diagnosis Date Noted  . Endometriosis 03/27/2019  . PCOS (polycystic ovarian syndrome) 03/27/2019  . Moderate persistent asthma 03/07/2019  . Post-nasal drainage 03/07/2019  . Irritable bowel syndrome  with diarrhea 03/07/2019  . Gastroesophageal reflux disease without esophagitis 03/07/2019  . Class 2 obesity due to excess calories without serious comorbidity with body mass index (BMI) of 38.0 to 38.9 in adult 09/02/2018  . Bipolar 2 disorder (HCC) 08/12/2018  . History of low birth weight 03/11/2018  . Vulvar atrophy 03/11/2018  . Dysmenorrhea 05/01/2015  . History of migraine headaches 05/01/2015  . Anxiety and depression 05/01/2015    Plan: Patient will undergo surgical management with dilation and curettage, hysteroscopy.   The risks of surgery were discussed in detail with the patient including but not limited to: bleeding which may require transfusion or reoperation; infection which may require antibiotics; injury to surrounding organs which may involve bowel, bladder, ureters ; need for additional procedures including laparoscopy or laparotomy; thromboembolic phenomenon, surgical site problems and other postoperative/anesthesia complications. Likelihood of success in alleviating the patient's condition was discussed. Routine postoperative instructions will be reviewed with the patient and her family in detail after surgery.  The patient concurred with the proposed plan, giving informed written consent for the surgery.  Patient has been NPO since last night she will remain NPO for procedure.  Anesthesia and OR aware.  Preoperative prophylactic antibiotics and SCDs ordered on call to the OR.  To OR when ready.  ----- Ranae Plumberhelsea Ward, MD, FACOG Attending Obstetrician and Gynecologist [facility], Department  of OB/GYN [facility]

## 2019-09-22 ENCOUNTER — Ambulatory Visit
Admission: RE | Admit: 2019-09-22 | Discharge: 2019-09-22 | Disposition: A | Discharge status: Home / Self Care | Payer: BC Managed Care – PPO | Attending: Obstetrics & Gynecology | Admitting: Obstetrics & Gynecology

## 2019-09-22 ENCOUNTER — Ambulatory Visit: Payer: BC Managed Care – PPO | Admitting: Certified Registered Nurse Anesthetist

## 2019-09-22 ENCOUNTER — Encounter: Payer: Self-pay | Admitting: *Deleted

## 2019-09-22 ENCOUNTER — Encounter
Admission: RE | Disposition: A | Discharge status: Home / Self Care | Payer: Self-pay | Source: Home / Self Care | Attending: Obstetrics & Gynecology

## 2019-09-22 ENCOUNTER — Other Ambulatory Visit: Payer: Self-pay

## 2019-09-22 DIAGNOSIS — N920 Excessive and frequent menstruation with regular cycle: Secondary | ICD-10-CM | POA: Insufficient documentation

## 2019-09-22 DIAGNOSIS — J4540 Moderate persistent asthma, uncomplicated: Secondary | ICD-10-CM | POA: Diagnosis not present

## 2019-09-22 DIAGNOSIS — Z79899 Other long term (current) drug therapy: Secondary | ICD-10-CM | POA: Diagnosis not present

## 2019-09-22 DIAGNOSIS — K219 Gastro-esophageal reflux disease without esophagitis: Secondary | ICD-10-CM | POA: Insufficient documentation

## 2019-09-22 DIAGNOSIS — Z7951 Long term (current) use of inhaled steroids: Secondary | ICD-10-CM | POA: Insufficient documentation

## 2019-09-22 DIAGNOSIS — Z87891 Personal history of nicotine dependence: Secondary | ICD-10-CM | POA: Insufficient documentation

## 2019-09-22 DIAGNOSIS — F419 Anxiety disorder, unspecified: Secondary | ICD-10-CM | POA: Diagnosis not present

## 2019-09-22 DIAGNOSIS — N809 Endometriosis, unspecified: Secondary | ICD-10-CM | POA: Diagnosis not present

## 2019-09-22 DIAGNOSIS — Z91040 Latex allergy status: Secondary | ICD-10-CM | POA: Insufficient documentation

## 2019-09-22 DIAGNOSIS — F3181 Bipolar II disorder: Secondary | ICD-10-CM | POA: Diagnosis not present

## 2019-09-22 DIAGNOSIS — R102 Pelvic and perineal pain: Secondary | ICD-10-CM | POA: Diagnosis not present

## 2019-09-22 DIAGNOSIS — E282 Polycystic ovarian syndrome: Secondary | ICD-10-CM | POA: Diagnosis not present

## 2019-09-22 DIAGNOSIS — R9389 Abnormal findings on diagnostic imaging of other specified body structures: Secondary | ICD-10-CM | POA: Diagnosis not present

## 2019-09-22 DIAGNOSIS — Z885 Allergy status to narcotic agent status: Secondary | ICD-10-CM | POA: Insufficient documentation

## 2019-09-22 HISTORY — PX: HYSTEROSCOPY WITH D & C: SHX1775

## 2019-09-22 LAB — CBC
HCT: 39.7 % (ref 36.0–46.0)
Hemoglobin: 13.1 g/dL (ref 12.0–15.0)
MCH: 25.9 pg — ABNORMAL LOW (ref 26.0–34.0)
MCHC: 33 g/dL (ref 30.0–36.0)
MCV: 78.5 fL — ABNORMAL LOW (ref 80.0–100.0)
Platelets: 384 10*3/uL (ref 150–400)
RBC: 5.06 MIL/uL (ref 3.87–5.11)
RDW: 15 % (ref 11.5–15.5)
WBC: 8.4 10*3/uL (ref 4.0–10.5)
nRBC: 0 % (ref 0.0–0.2)

## 2019-09-22 LAB — BASIC METABOLIC PANEL
Anion gap: 13 (ref 5–15)
BUN: 13 mg/dL (ref 6–20)
CO2: 21 mmol/L — ABNORMAL LOW (ref 22–32)
Calcium: 9.2 mg/dL (ref 8.9–10.3)
Chloride: 103 mmol/L (ref 98–111)
Creatinine, Ser: 0.48 mg/dL (ref 0.44–1.00)
GFR calc Af Amer: >60 mL/min (ref >60)
GFR calc non Af Amer: >60 mL/min (ref >60)
Glucose, Bld: 95 mg/dL (ref 70–99)
Potassium: 4 mmol/L (ref 3.5–5.1)
Sodium: 137 mmol/L (ref 135–145)

## 2019-09-22 LAB — POCT PREGNANCY, URINE: Preg Test, Ur: NEGATIVE

## 2019-09-22 LAB — TYPE AND SCREEN
ABO/RH(D): A POS
Antibody Screen: NEGATIVE

## 2019-09-22 SURGERY — DILATATION AND CURETTAGE /HYSTEROSCOPY
Anesthesia: General

## 2019-09-22 MED ORDER — ACETAMINOPHEN 325 MG PO TABS: 650.0000 mg | ORAL_TABLET | ORAL | Status: DC | PRN

## 2019-09-22 MED ORDER — FENTANYL CITRATE (PF) 100 MCG/2ML IJ SOLN: 25.0000 ug | INTRAMUSCULAR | Status: DC | PRN

## 2019-09-22 MED ORDER — SODIUM CHLORIDE FLUSH 0.9 % IV SOLN
INTRAVENOUS | Status: AC
  Filled 2019-09-22: qty 10

## 2019-09-22 MED ORDER — MIDAZOLAM HCL 2 MG/2ML IJ SOLN
INTRAMUSCULAR | Status: AC
  Filled 2019-09-22: qty 2

## 2019-09-22 MED ORDER — LACTATED RINGERS IV SOLN
INTRAVENOUS | Status: DC
  Administered 2019-09-22: 13:00:00 via INTRAVENOUS

## 2019-09-22 MED ORDER — KETOROLAC TROMETHAMINE 30 MG/ML IJ SOLN
INTRAMUSCULAR | Status: DC | PRN
  Administered 2019-09-22: 30 mg via INTRAVENOUS

## 2019-09-22 MED ORDER — MIDAZOLAM HCL 2 MG/2ML IJ SOLN
INTRAMUSCULAR | Status: DC | PRN
  Administered 2019-09-22: 2 mg via INTRAVENOUS

## 2019-09-22 MED ORDER — OXYCODONE HCL 5 MG/5ML PO SOLN: 5.0000 mg | Freq: Once | ORAL | Status: AC | PRN

## 2019-09-22 MED ORDER — PROPOFOL 10 MG/ML IV BOLUS
INTRAVENOUS | Status: AC
  Filled 2019-09-22: qty 20

## 2019-09-22 MED ORDER — PROMETHAZINE HCL 25 MG/ML IJ SOLN
INTRAMUSCULAR | Status: AC
  Administered 2019-09-22: 6.25 mg via INTRAVENOUS
  Filled 2019-09-22: qty 1

## 2019-09-22 MED ORDER — OXYCODONE HCL 5 MG PO TABS
5.0000 mg | ORAL_TABLET | Freq: Once | ORAL | Status: AC | PRN
  Administered 2019-09-22: 5 mg via ORAL

## 2019-09-22 MED ORDER — FENTANYL CITRATE (PF) 100 MCG/2ML IJ SOLN
INTRAMUSCULAR | Status: DC | PRN
  Administered 2019-09-22 (×2): 25 ug via INTRAVENOUS

## 2019-09-22 MED ORDER — OXYCODONE HCL 5 MG PO TABS
ORAL_TABLET | ORAL | Status: AC
  Filled 2019-09-22: qty 1

## 2019-09-22 MED ORDER — DEXAMETHASONE SODIUM PHOSPHATE 10 MG/ML IJ SOLN
INTRAMUSCULAR | Status: DC | PRN
  Administered 2019-09-22: 4 mg via INTRAVENOUS

## 2019-09-22 MED ORDER — MEPERIDINE HCL 50 MG/ML IJ SOLN: 6.2500 mg | INTRAMUSCULAR | Status: DC | PRN

## 2019-09-22 MED ORDER — LACTATED RINGERS IV SOLN: INTRAVENOUS | Status: DC

## 2019-09-22 MED ORDER — ACETAMINOPHEN 650 MG RE SUPP
650.0000 mg | RECTAL | Status: DC | PRN
  Filled 2019-09-22: qty 1

## 2019-09-22 MED ORDER — PROPOFOL 10 MG/ML IV BOLUS
INTRAVENOUS | Status: DC | PRN
  Administered 2019-09-22: 200 mg via INTRAVENOUS

## 2019-09-22 MED ORDER — PROMETHAZINE HCL 25 MG/ML IJ SOLN
6.2500 mg | INTRAMUSCULAR | Status: DC | PRN
  Administered 2019-09-22: 15:00:00 6.25 mg via INTRAVENOUS

## 2019-09-22 MED ORDER — LIDOCAINE HCL (CARDIAC) PF 100 MG/5ML IV SOSY
PREFILLED_SYRINGE | INTRAVENOUS | Status: DC | PRN
  Administered 2019-09-22: 100 mg via INTRATRACHEAL

## 2019-09-22 MED ORDER — ONDANSETRON HCL 4 MG/2ML IJ SOLN
INTRAMUSCULAR | Status: DC | PRN
  Administered 2019-09-22: 4 mg via INTRAVENOUS

## 2019-09-22 MED ORDER — FENTANYL CITRATE (PF) 100 MCG/2ML IJ SOLN
INTRAMUSCULAR | Status: AC
  Filled 2019-09-22: qty 2

## 2019-09-22 SURGICAL SUPPLY — 14 items
ELECT REM PT RETURN 9FT ADLT (ELECTROSURGICAL) ×2
ELECTRODE REM PT RTRN 9FT ADLT (ELECTROSURGICAL) ×1 IMPLANT
GLOVE PI ORTHOPRO 6.5 (GLOVE) ×1
GLOVE PI ORTHOPRO STRL 6.5 (GLOVE) ×1 IMPLANT
GLOVE SURG SYN 6.5 ES PF (GLOVE) ×4 IMPLANT
GOWN STRL REUS W/ TWL LRG LVL3 (GOWN DISPOSABLE) ×2 IMPLANT
GOWN STRL REUS W/TWL LRG LVL3 (GOWN DISPOSABLE) ×2
IV LACTATED RINGERS 1000ML (IV SOLUTION) ×2 IMPLANT
KIT TURNOVER CYSTO (KITS) ×2 IMPLANT
PACK DNC HYST (MISCELLANEOUS) ×2 IMPLANT
PAD OB MATERNITY 4.3X12.25 (PERSONAL CARE ITEMS) ×2 IMPLANT
PAD PREP 24X41 OB/GYN DISP (PERSONAL CARE ITEMS) ×2 IMPLANT
SEAL ROD LENS SCOPE MYOSURE (ABLATOR) ×2 IMPLANT
TUBING CONNECTING 10 (TUBING) ×2 IMPLANT

## 2019-09-22 NOTE — Transfer of Care (Signed)
Immediate Anesthesia Transfer of Care Note  Patient: Janet Rich  Procedure(s) Performed: DILATATION AND CURETTAGE /HYSTEROSCOPY (N/A )  Patient Location: PACU  Anesthesia Type:General  Level of Consciousness: awake, alert  and oriented  Airway & Oxygen Therapy: Patient Spontanous Breathing and Patient connected to face mask oxygen  Post-op Assessment: Report given to RN and Post -op Vital signs reviewed and stable  Post vital signs: Reviewed and stable  Last Vitals:  Vitals Value Taken Time  BP 133/96 09/22/19 1432  Temp 36.2 C 09/22/19 1432  Pulse 64 09/22/19 1433  Resp 17 09/22/19 1433  SpO2 100 % 09/22/19 1433  Vitals shown include unvalidated device data.  Last Pain:  Vitals:   09/22/19 1211  TempSrc: Temporal  PainSc: 5          Complications: No apparent anesthesia complications

## 2019-09-22 NOTE — Anesthesia Preprocedure Evaluation (Signed)
Anesthesia Evaluation  Patient identified by MRN, date of birth, ID band Patient awake    Reviewed: Allergy & Precautions, NPO status , Patient's Chart, lab work & pertinent test results  History of Anesthesia Complications (+) PONV and history of anesthetic complications  Airway Mallampati: I  TM Distance: >3 FB Neck ROM: Full    Dental no notable dental hx.    Pulmonary asthma , neg sleep apnea, former smoker,    breath sounds clear to auscultation- rhonchi (-) wheezing      Cardiovascular Exercise Tolerance: Good (-) hypertension(-) CAD, (-) Past MI, (-) Cardiac Stents and (-) CABG  Rhythm:Regular Rate:Normal - Systolic murmurs and - Diastolic murmurs    Neuro/Psych  Headaches, neg Seizures PSYCHIATRIC DISORDERS Anxiety Depression Bipolar Disorder    GI/Hepatic Neg liver ROS, GERD  ,  Endo/Other  negative endocrine ROSneg diabetes  Renal/GU negative Renal ROS     Musculoskeletal negative musculoskeletal ROS (+)   Abdominal (+) + obese,   Peds  Hematology  (+) anemia ,   Anesthesia Other Findings Past Medical History: No date: Anemia     Comment:  with periods No date: Ankle fracture     Comment:  right No date: Anxiety No date: Asthma No date: Back pain No date: Depression No date: Endometriosis No date: GERD (gastroesophageal reflux disease) No date: Headache No date: Ovarian cyst No date: PCOS (polycystic ovarian syndrome) No date: Pneumonia     Comment:  age 23 No date: PONV (postoperative nausea and vomiting)     Comment:  nausea after wisdom teeth extraction No date: Vaginal Pap smear, abnormal     Comment:  bx ok   Reproductive/Obstetrics                             Anesthesia Physical Anesthesia Plan  ASA: II  Anesthesia Plan: General   Post-op Pain Management:    Induction: Intravenous  PONV Risk Score and Plan: 3 and Ondansetron, Dexamethasone and  Midazolam  Airway Management Planned: LMA  Additional Equipment:   Intra-op Plan:   Post-operative Plan:   Informed Consent: I have reviewed the patients History and Physical, chart, labs and discussed the procedure including the risks, benefits and alternatives for the proposed anesthesia with the patient or authorized representative who has indicated his/her understanding and acceptance.     Dental advisory given  Plan Discussed with: CRNA and Anesthesiologist  Anesthesia Plan Comments:         Anesthesia Quick Evaluation

## 2019-09-22 NOTE — Op Note (Signed)
Operative Report Hysteroscopy, Dilation and Curettage 09/22/2019  Patient:  Janet Rich  23 y.o. female Preoperative diagnosis:  menorrhagia, pelvic pain Postoperative diagnosis:  menorrhagia, pelvic pain  PROCEDURE:  Procedure(s): DILATATION AND CURETTAGE /HYSTEROSCOPY (N/A) Surgeon:  Surgeon(s) and Role:    * Arrielle Mcginn, Honor Loh, MD - Primary Anesthesia:  LMA I/O: Total I/O In: 600 [I.V.:600] Out: - no UOP, <25cc EBL Specimens:  Endometrial curettings Complications: None Apparent Disposition:  VS stable to PACU  Findings: Uterus, mobile, normal size, sounding to 10cm; normal cervix, vagina, perineum.  Uterine cavity appeared normal without polyp or abnormal tissue, however with curettage a large amount of tissue was extracted. Bilateral tubal ostea visualized.  Indication for procedure/Consents: 23 y.o. G1P1001  here for scheduled surgery for the aforementioned diagnoses.  Risks of surgery were discussed with the patient including but not limited to: bleeding which may require transfusion; infection which may require antibiotics; injury to uterus or surrounding organs; intrauterine scarring which may impair future fertility; need for additional procedures including laparotomy or laparoscopy; and other postoperative/anesthesia complications. Written informed consent was obtained.    Procedure Details:   The patient was then taken to the operating room where anesthesia was administered and was found to be adequate.  After a formal timeout was performed, she was placed in the dorsal lithotomy position and examined with the above findings. She was then prepped and draped in the sterile manner.  A speculum was then placed in the patient's vagina and a single tooth tenaculum was applied to the anterior lip of the cervix.    The uterus was sounded to 10cm. Her cervix was serially dilated to accommodate the myoscope, with findings as above. A sharp curettage was then performed until there was a  gritty texture in all four quadrants. The specimen was handed off to nursing.  The camera was reinserted and confirmed the uterus had been evacuated. The tenaculum was removed from the anterior lip of the cervix and the vaginal speculum was removed after noting good hemostasis. The patient tolerated the procedure well and was taken to the recovery area awake, extubated and in stable condition.  The patient will be discharged to home as per PACU criteria.  Routine postoperative instructions given. She will follow up in the clinic in two to four weeks for postoperative evaluation.  Larey Days, MD [facility] Attending Gynecologist

## 2019-09-22 NOTE — Anesthesia Post-op Follow-up Note (Signed)
Anesthesia QCDR form completed.        

## 2019-09-22 NOTE — Discharge Instructions (Signed)
You should expect to have some cramping and vaginal bleeding for about a week. This should taper off and subside, much like a period. If heavy bleeding continues or gets worse, you should contact the office for an earlier appointment.   Please call the office or physician on call for fever >101, severe pain, and heavy bleeding.   [phone]  NOTHING IN THE VAGINA FOR 2 WEEKS!!  Dr. Ward will discuss pathology results with you at your postop visit.    AMBULATORY SURGERY  DISCHARGE INSTRUCTIONS   1) The drugs that you were given will stay in your system until tomorrow so for the next 24 hours you should not:  A) Drive an automobile B) Make any legal decisions C) Drink any alcoholic beverage   2) You may resume regular meals tomorrow.  Today it is better to start with liquids and gradually work up to solid foods.  You may eat anything you prefer, but it is better to start with liquids, then soup and crackers, and gradually work up to solid foods.   3) Please notify your doctor immediately if you have any unusual bleeding, trouble breathing, redness and pain at the surgery site, drainage, fever, or pain not relieved by medication.    4) Additional Instructions:        Please contact your physician with any problems or Same Day Surgery at [phone], Monday through Friday 6 am to 4 pm, or [facility] [place] Main number at [phone].  

## 2019-09-22 NOTE — Anesthesia Postprocedure Evaluation (Signed)
Anesthesia Post Note  Patient: Janet Rich  Procedure(s) Performed: DILATATION AND CURETTAGE /HYSTEROSCOPY (N/A )  Patient location during evaluation: PACU Anesthesia Type: General Level of consciousness: awake and alert and oriented Pain management: pain level controlled Vital Signs Assessment: post-procedure vital signs reviewed and stable Respiratory status: spontaneous breathing, nonlabored ventilation and respiratory function stable Cardiovascular status: blood pressure returned to baseline and stable Postop Assessment: no signs of nausea or vomiting Anesthetic complications: no     Last Vitals:  Vitals:   09/22/19 1456 09/22/19 1517  BP:  122/78  Pulse: 69 70  Resp: 18 20  Temp:    SpO2: 98% 99%    Last Pain:  Vitals:   09/22/19 1517  TempSrc:   PainSc: 1                  Denene Alamillo

## 2019-09-22 NOTE — Anesthesia Procedure Notes (Signed)
Procedure Name: LMA Insertion Date/Time: 09/22/2019 1:58 PM Performed by: Babs Sciara, CRNA Pre-anesthesia Checklist: Patient identified, Emergency Drugs available, Suction available, Patient being monitored and Timeout performed Patient Re-evaluated:Patient Re-evaluated prior to induction Oxygen Delivery Method: Circle system utilized Preoxygenation: Pre-oxygenation with 100% oxygen Induction Type: IV induction Ventilation: Mask ventilation without difficulty LMA: LMA inserted LMA Size: 4.0 Placement Confirmation: positive ETCO2 and breath sounds checked- equal and bilateral Tube secured with: Tape Dental Injury: Teeth and Oropharynx as per pre-operative assessment

## 2019-09-23 ENCOUNTER — Encounter: Payer: Self-pay | Admitting: Obstetrics & Gynecology

## 2019-09-25 DIAGNOSIS — M25371 Other instability, right ankle: Secondary | ICD-10-CM | POA: Diagnosis not present

## 2019-09-26 LAB — SURGICAL PATHOLOGY

## 2019-09-28 DIAGNOSIS — M25371 Other instability, right ankle: Secondary | ICD-10-CM | POA: Diagnosis not present

## 2019-10-04 DIAGNOSIS — M25671 Stiffness of right ankle, not elsewhere classified: Secondary | ICD-10-CM | POA: Diagnosis not present

## 2019-10-04 DIAGNOSIS — M25571 Pain in right ankle and joints of right foot: Secondary | ICD-10-CM | POA: Diagnosis not present

## 2019-10-10 DIAGNOSIS — M25571 Pain in right ankle and joints of right foot: Secondary | ICD-10-CM | POA: Diagnosis not present

## 2019-10-10 DIAGNOSIS — M25671 Stiffness of right ankle, not elsewhere classified: Secondary | ICD-10-CM | POA: Diagnosis not present

## 2019-10-16 DIAGNOSIS — M25671 Stiffness of right ankle, not elsewhere classified: Secondary | ICD-10-CM | POA: Diagnosis not present

## 2019-10-16 DIAGNOSIS — M25571 Pain in right ankle and joints of right foot: Secondary | ICD-10-CM | POA: Diagnosis not present

## 2019-10-20 NOTE — L&D Delivery Note (Signed)
  Janet Rich, Janet Rich [[id number]]  Delivery Note At 1:28 PM a viable female was delivered via Vaginal, Spontaneous (Presentation: VTX     ).  APGAR: 8, 9; weight 4 lb 13.3 oz (2190 g).   Placenta status: Spontaneous, Intact.  Cord: 3 vessels with the following complications: None.  Delivery by Anselm Pancoast ( TJS  Present) Anesthesia: Epidural Episiotomy: None Lacerations: 1st degree;Vaginal;Perineal Suture Repair: 3.0 vicryl Est. Blood Loss (mL):   250 cc total for both deliveries  Mom to postpartum.  Baby to Couplet care / Skin to Skin.  Janet Rich 08/14/2020, 1:51 PM     Janet Rich [[id number]]  Delivery Note. After Baby A delivered  I ( TJS ) assumed care .  After 10 min AROM performed and mother pushes to bring engage head. With 3 pushes baby B head delivered . Loose nuchal cord reduced  At 1:35 PM a viable female was delivered via Vaginal, Spontaneous (Presentation:  VTX     ).  APGAR: 8, 9; weight 4 lb 8.7 oz (2060 g).   Placenta status: Spontaneous, Intact. Delivered at 1340. One placenta ( baby a one clamp baby b 2 clamps  Cord: 3 vessels with the following complications: None. Anesthesia: Epidural Episiotomy: None Lacerations: 1st degree;Vaginal;Perineal done by CNM [place]  Suture Repair: 3.0 Est. Blood Loss (mL):  Total ebl 250 cc  Mom to postpartum.  Baby to Couplet care / Skin to Skin.  Janet Rich 08/14/2020, 1:51 PM

## 2019-10-23 DIAGNOSIS — M25671 Stiffness of right ankle, not elsewhere classified: Secondary | ICD-10-CM | POA: Diagnosis not present

## 2019-10-23 DIAGNOSIS — M25571 Pain in right ankle and joints of right foot: Secondary | ICD-10-CM | POA: Diagnosis not present

## 2019-10-27 DIAGNOSIS — N920 Excessive and frequent menstruation with regular cycle: Secondary | ICD-10-CM | POA: Diagnosis not present

## 2019-11-03 ENCOUNTER — Encounter: Payer: Self-pay | Admitting: Family Medicine

## 2019-11-03 NOTE — Telephone Encounter (Signed)
Pt scheduled for 11/08/19 at 11:00 virtual. Please reach out to pt if Irving Burton needed to see her sooner as no openings were available until then

## 2019-11-08 ENCOUNTER — Other Ambulatory Visit: Payer: Self-pay

## 2019-11-08 ENCOUNTER — Encounter: Payer: Self-pay | Admitting: Family Medicine

## 2019-11-08 ENCOUNTER — Ambulatory Visit (INDEPENDENT_AMBULATORY_CARE_PROVIDER_SITE_OTHER): Payer: BC Managed Care – PPO | Admitting: Family Medicine

## 2019-11-08 DIAGNOSIS — H6691 Otitis media, unspecified, right ear: Secondary | ICD-10-CM | POA: Diagnosis not present

## 2019-11-08 DIAGNOSIS — G500 Trigeminal neuralgia: Secondary | ICD-10-CM

## 2019-11-08 MED ORDER — AMOXICILLIN 500 MG PO CAPS: 500.0000 mg | ORAL_CAPSULE | Freq: Three times a day (TID) | ORAL | 0 refills | Status: DC

## 2019-11-08 NOTE — Progress Notes (Signed)
Name: Janet Rich   MRN: [id number]    DOB: October 18, 1996   Date:11/08/2019       Progress Note  Subjective  Chief Complaint  Chief Complaint  Patient presents with  . Otalgia    right ear for 4 days    I connected with  Marvel Plan  on 11/08/19 at 11:00 AM EST by a video enabled telemedicine application and verified that I am speaking with the correct person using two identifiers.  I discussed the limitations of evaluation and management by telemedicine and the availability of in person appointments. The patient expressed understanding and agreed to proceed. Staff also discussed with the patient that there may be a patient responsible charge related to this service. Patient Location: Home Provider Location: Home Office Additional Individuals present: None  HPI  Pt presents with concern for right otalgia for about 10 days - gradually worsening.  She also notes that she has some shooting nerve pain into the proximal jaw - no history of trigeminal neuralgia, but this pain has been present for over a year now on and off.  She does endorse some itching in the ear canal, however her pain is quite deep in her ear.  Does endorses nasal congestion.  No lymphadenopathy per her report, cough, chest pain, shortness of breath, fevers/chills, no loss taste/smell.  Patient Active Problem List   Diagnosis Date Noted  . Endometriosis 03/27/2019  . PCOS (polycystic ovarian syndrome) 03/27/2019  . Moderate persistent asthma 03/07/2019  . Post-nasal drainage 03/07/2019  . Irritable bowel syndrome with diarrhea 03/07/2019  . Gastroesophageal reflux disease without esophagitis 03/07/2019  . Class 2 obesity due to excess calories without serious comorbidity with body mass index (BMI) of 38.0 to 38.9 in adult 09/02/2018  . Bipolar 2 disorder (HCC) 08/12/2018  . History of low birth weight 03/11/2018  . Vulvar atrophy 03/11/2018  . Dysmenorrhea 05/01/2015  . History of migraine headaches 05/01/2015    . Anxiety and depression 05/01/2015    Social History   Tobacco Use  . Smoking status: Former Smoker    Types: Cigarettes    Quit date: 12/20/2009    Years since quitting: 9.8  . Smokeless tobacco: Never Used  . Tobacco comment: age [age], quit  Substance Use Topics  . Alcohol use: No    Comment: occassionally     Current Outpatient Medications:  .  acetaminophen (TYLENOL) 500 MG tablet, Take 500 mg by mouth every 6 (six) hours as needed for headache., Disp: , Rfl:  .  albuterol (PROVENTIL HFA;VENTOLIN HFA) 108 (90 Base) MCG/ACT inhaler, Inhale 1-2 puffs into the lungs every 6 (six) hours as needed for wheezing or shortness of breath., Disp: 1 Inhaler, Rfl: 0 .  albuterol (PROVENTIL) (2.5 MG/3ML) 0.083% nebulizer solution, Take 3 mLs (2.5 mg total) by nebulization every 6 (six) hours as needed for wheezing or shortness of breath., Disp: 150 mL, Rfl: 1 .  ALPRAZolam (XANAX) 0.25 MG tablet, Take 0.25 mg by mouth at bedtime as needed for anxiety., Disp: , Rfl:  .  budesonide-formoterol (SYMBICORT) 80-4.5 MCG/ACT inhaler, Inhale 2 puffs into the lungs 2 (two) times daily., Disp: 1 Inhaler, Rfl: 3 .  ibuprofen (ADVIL,MOTRIN) 800 MG tablet, Take 1 tablet (800 mg total) by mouth 3 (three) times daily. (Patient taking differently: Take 800 mg by mouth every 8 (eight) hours as needed for moderate pain. ), Disp: 30 tablet, Rfl: 0 .  lamoTRIgine (LAMICTAL) 200 MG tablet, Take 200 mg by mouth daily. ,  Disp: , Rfl:  .  Prenatal Vit-Fe Fumarate-FA (PRENATAL MULTIVITAMIN) TABS tablet, Take 2 tablets by mouth daily., Disp: , Rfl:  .  traMADol (ULTRAM) 50 MG tablet, Take 0.5-1 tablets (25-50 mg total) by mouth every 12 (twelve) hours as needed for moderate pain or severe pain., Disp: 15 tablet, Rfl: 0 .  vitamin C (ASCORBIC ACID) 500 MG tablet, Take 500 mg by mouth daily., Disp: , Rfl:   Allergies  Allergen Reactions  . Other     Must have anti-emetic prior to being given any narcotics   . Latex  Rash    I personally reviewed active problem list, medication list, allergies, notes from last encounter, lab results with the patient/caregiver today.  ROS  Ten systems reviewed and is negative except as mentioned in HPI   Objective  Virtual encounter, vitals not obtained.  There is no height or weight on file to calculate BMI.  Nursing Note and Vital Signs reviewed.  Physical Exam  Constitutional: Patient appears well-developed and well-nourished. No distress.  HENT: Head: Normocephalic and atraumatic. Neck: Normal range of motion. Pulmonary/Chest: Effort normal. No respiratory distress. Speaking in complete sentences Neurological: Pt is alert and oriented to person, place, and time. Coordination, speech and gait are normal.  Psychiatric: Patient has a normal mood and affect. behavior is normal. Judgment and thought content normal.  No results found for this or any previous visit (from the past 72 hour(s)).  Assessment & Plan  1. Right otitis media, unspecified otitis media type - amoxicillin (AMOXIL) 500 MG capsule; Take 1 capsule (500 mg total) by mouth 3 (three) times daily.  Dispense: 30 capsule; Refill: 0  2. Right trigeminal neuralgia - Has had intermittent right sided facial pain that she describes as sudden bursts of "nerve pain" for about a year now.  We will refer to neurology for further evaluation.   -Red flags and when to present for emergency care or RTC including fever >101.3F, chest pain, shortness of breath, new/worsening/un-resolving symptoms, reviewed with patient at time of visit. Follow up and care instructions discussed and provided in AVS. - I discussed the assessment and treatment plan with the patient. The patient was provided an opportunity to ask questions and all were answered. The patient agreed with the plan and demonstrated an understanding of the instructions.  I provided 11 minutes of non-face-to-face time during this encounter.  Hubbard Hartshorn, FNP

## 2019-11-10 ENCOUNTER — Encounter: Payer: Self-pay | Admitting: Family Medicine

## 2019-11-10 DIAGNOSIS — H6691 Otitis media, unspecified, right ear: Secondary | ICD-10-CM

## 2019-11-13 ENCOUNTER — Other Ambulatory Visit: Payer: Self-pay | Admitting: Family Medicine

## 2019-11-13 ENCOUNTER — Encounter: Payer: Self-pay | Admitting: Family Medicine

## 2019-11-13 MED ORDER — AZITHROMYCIN 250 MG PO TABS: ORAL_TABLET | ORAL | 0 refills | Status: DC

## 2019-11-20 DIAGNOSIS — M9901 Segmental and somatic dysfunction of cervical region: Secondary | ICD-10-CM | POA: Diagnosis not present

## 2019-11-20 DIAGNOSIS — R42 Dizziness and giddiness: Secondary | ICD-10-CM | POA: Diagnosis not present

## 2019-11-20 DIAGNOSIS — M530 Cervicocranial syndrome: Secondary | ICD-10-CM | POA: Diagnosis not present

## 2019-11-22 DIAGNOSIS — M25571 Pain in right ankle and joints of right foot: Secondary | ICD-10-CM | POA: Diagnosis not present

## 2019-11-22 DIAGNOSIS — M25671 Stiffness of right ankle, not elsewhere classified: Secondary | ICD-10-CM | POA: Diagnosis not present

## 2019-11-27 ENCOUNTER — Other Ambulatory Visit: Payer: Self-pay

## 2019-11-27 ENCOUNTER — Ambulatory Visit
Admission: EM | Admit: 2019-11-27 | Discharge: 2019-11-27 | Disposition: A | Discharge status: Home / Self Care | Payer: BC Managed Care – PPO | Attending: Family Medicine | Admitting: Family Medicine

## 2019-11-27 ENCOUNTER — Ambulatory Visit
Admit: 2019-11-27 | Discharge: 2019-11-27 | Disposition: A | Discharge status: Home / Self Care | Payer: BC Managed Care – PPO | Attending: Family Medicine | Admitting: Family Medicine

## 2019-11-27 DIAGNOSIS — O4690 Antepartum hemorrhage, unspecified, unspecified trimester: Secondary | ICD-10-CM | POA: Insufficient documentation

## 2019-11-27 DIAGNOSIS — Z3A00 Weeks of gestation of pregnancy not specified: Secondary | ICD-10-CM | POA: Diagnosis not present

## 2019-11-27 DIAGNOSIS — M65871 Other synovitis and tenosynovitis, right ankle and foot: Secondary | ICD-10-CM | POA: Diagnosis not present

## 2019-11-27 DIAGNOSIS — M25671 Stiffness of right ankle, not elsewhere classified: Secondary | ICD-10-CM | POA: Diagnosis not present

## 2019-11-27 DIAGNOSIS — M25571 Pain in right ankle and joints of right foot: Secondary | ICD-10-CM | POA: Diagnosis not present

## 2019-11-27 DIAGNOSIS — O209 Hemorrhage in early pregnancy, unspecified: Secondary | ICD-10-CM | POA: Diagnosis not present

## 2019-11-27 LAB — HCG, QUANTITATIVE, PREGNANCY: hCG, Beta Chain, Quant, S: 20 m[IU]/mL — ABNORMAL HIGH (ref <5)

## 2019-11-27 NOTE — Discharge Instructions (Signed)
Go to the medical mall at [facility]. They will screen you and send you to [place] (if no one is there, use the phone).  I will call.  Take care  Dr. Adriana Simas

## 2019-11-27 NOTE — ED Provider Notes (Signed)
[facility]    CSN: [id number] Arrival date & time: 11/27/19  1654  History   Chief Complaint Chief Complaint  Patient presents with  . Vaginal Bleeding   HPI  24 year old female presents with vaginal bleeding in the setting of a positive home pregnancy test.  Patient is actively trying to get pregnant.  She states that she took a pregnancy test at home yesterday and it was positive.  She states that she took it because she was late for her period.  First day last menstrual period was 1/10.  Patient reports that she developed vaginal bleeding today.  Associated left lower quadrant pain.  Patient is concerned that she is having a miscarriage.  She rates her pain as 5/10 in severity.  She states that the amount of bleeding is more than spotting but less than the typical menstrual cycle.  No other reported symptoms.  No other complaints or concerns at this time.  Past Medical History:  Diagnosis Date  . Anemia    with periods  . Ankle fracture    right  . Anxiety   . Asthma   . Back pain   . Depression   . Endometriosis   . GERD (gastroesophageal reflux disease)   . Headache   . Ovarian cyst   . PCOS (polycystic ovarian syndrome)   . Pneumonia    age 79  . PONV (postoperative nausea and vomiting)    nausea after wisdom teeth extraction  . Vaginal Pap smear, abnormal    bx ok   Patient Active Problem List   Diagnosis Date Noted  . Right trigeminal neuralgia 11/08/2019  . Endometriosis 03/27/2019  . PCOS (polycystic ovarian syndrome) 03/27/2019  . Moderate persistent asthma 03/07/2019  . Post-nasal drainage 03/07/2019  . Irritable bowel syndrome with diarrhea 03/07/2019  . Gastroesophageal reflux disease without esophagitis 03/07/2019  . Class 2 obesity due to excess calories without serious comorbidity with body mass index (BMI) of 38.0 to 38.9 in adult 09/02/2018  . Bipolar 2 disorder (HCC) 08/12/2018  . History of low birth weight 03/11/2018  . Vulvar  atrophy 03/11/2018  . Dysmenorrhea 05/01/2015  . History of migraine headaches 05/01/2015  . Anxiety and depression 05/01/2015    Past Surgical History:  Procedure Laterality Date  . FRACTURE SURGERY     ankle surgery with impant   . HYSTEROSCOPY WITH D & C N/A 09/22/2019   Procedure: DILATATION AND CURETTAGE /HYSTEROSCOPY;  Surgeon: Ward, Elenora Fender, MD;  Location: [facility] ORS;  Service: Gynecology;  Laterality: N/A;  . ROBOTIC ASSISTED DIAGNOSTIC LAPAROSCOPY N/A 02/18/2017   Procedure: ROBOTIC ASSISTED DIAGNOSTIC LAPAROSCOPY,EXCISION AND ABLATION OF ENDOMETRIOSIS,LYSIS OF ADHESIONS;  Surgeon: Olivia Mackie, MD;  Location: [facility];  Service: Gynecology;  Laterality: N/A;  . WISDOM TOOTH EXTRACTION  2013    OB History    Gravida  2   Para  1   Term  1   Preterm  0   AB  0   Living  1     SAB  0   TAB  0   Ectopic  0   Multiple  0   Live Births  1            Home Medications    Prior to Admission medications   Medication Sig Start Date End Date Taking? Authorizing Provider  acetaminophen (TYLENOL) 500 MG tablet Take 500 mg by mouth every 6 (six) hours as needed for headache.    [provider]  albuterol (PROVENTIL HFA;VENTOLIN HFA) 108 (90 Base) MCG/ACT inhaler Inhale 1-2 puffs into the lungs every 6 (six) hours as needed for wheezing or shortness of breath. 11/19/18   Melynda Ripple, MD  albuterol (PROVENTIL) (2.5 MG/3ML) 0.083% nebulizer solution Take 3 mLs (2.5 mg total) by nebulization every 6 (six) hours as needed for wheezing or shortness of breath. 01/30/19   Hubbard Hartshorn, FNP  budesonide-formoterol (SYMBICORT) 80-4.5 MCG/ACT inhaler Inhale 2 puffs into the lungs 2 (two) times daily. 06/23/19   Hubbard Hartshorn, FNP  lamoTRIgine (LAMICTAL) 200 MG tablet Take 200 mg by mouth daily.     [provider]  Prenatal Vit-Fe Fumarate-FA (PRENATAL MULTIVITAMIN) TABS tablet Take 2 tablets by mouth daily.    [provider]    Family History  Family History  Problem Relation Age of Onset  . Endometriosis Mother   . Bipolar disorder Mother   . Diabetes Mother        prediabetes  . Breast cancer Paternal Grandmother   . Cancer Paternal Grandmother   . Depression Paternal Grandmother   . Schizophrenia Father   . Depression Father   . Heart disease Neg Hx   . Colon cancer Neg Hx   . Ovarian cancer Neg Hx     Social History Social History   Tobacco Use  . Smoking status: Former Smoker    Types: Cigarettes    Quit date: 12/20/2009    Years since quitting: 9.9  . Smokeless tobacco: Never Used  . Tobacco comment: age [age], quit  Substance Use Topics  . Alcohol use: Not Currently  . Drug use: No     Allergies   Amoxicillin, Other, and Latex   Review of Systems Review of Systems  Constitutional: Negative.   Genitourinary: Positive for vaginal bleeding.   Physical Exam Triage Vital Signs ED Triage Vitals  Enc Vitals Group     BP 11/27/19 1714 127/62     Pulse Rate 11/27/19 1714 85     Resp 11/27/19 1714 16     Temp 11/27/19 1714 98.4 F (36.9 C)     Temp Source 11/27/19 1714 Oral     SpO2 11/27/19 1714 100 %     Weight 11/27/19 1717 205 lb (93 kg)     Height 11/27/19 1717 5\' 1"  (1.549 m)     Head Circumference --      Peak Flow --      Pain Score 11/27/19 1717 5     Pain Loc --      Pain Edu? --      Excl. in [place]? --    Updated Vital Signs BP 127/62 (BP Location: Left Arm)   Pulse 85   Temp 98.4 F (36.9 C) (Oral)   Resp 16   Ht 5\' 1"  (1.549 m)   Wt 93 kg   LMP 10/29/2019   SpO2 100%   BMI 38.73 kg/m   Visual Acuity Right Eye Distance:   Left Eye Distance:   Bilateral Distance:    Right Eye Near:   Left Eye Near:    Bilateral Near:     Physical Exam   UC Treatments / Results  Labs (all labs ordered are listed, but only abnormal results are displayed) Labs Reviewed  HCG, QUANTITATIVE, PREGNANCY - Abnormal; Notable for the following components:      Result Value   hCG, Beta  Chain, Quant, S 20 (*)    All other components within normal limits  EKG   Radiology US OB LESS THAN 14 WEEKS WITH OB TRANSVAGINAL  Result Date: 11/27/2019 CLINICAL DATA:  Vaginal bleeding EXAM: OBSTETRIC <14 WK [place] AND TRANSVAGINAL OB US TECHNIQUE: Both transabdominal and transvaginal ultrasound examinations were performed for complete evaluation of the gestation as well as the maternal uterus, adnexal regions, and pelvic cul-de-sac. Transvaginal technique was performed to assess early pregnancy. COMPARISON:  None. FINDINGS: Intrauterine gestational sac: None Yolk sac:  Not visualized Embryo:  Not visualized Cardiac Activity: Not visualized Heart Rate:   bpm MSD:   mm    w     d CRL:    mm    w    d                  [place] EDC: Subchorionic hemorrhage:  None visualized. Maternal uterus/adnexae: Small amount of free fluid. No adnexal mass. Endometrium 16 mm in thickness. IMPRESSION: No intrauterine pregnancy visualized. Differential considerations would include early intrauterine pregnancy too early to visualize, spontaneous abortion, or occult ectopic pregnancy. Recommend close clinical followup and serial quantitative beta HCGs and ultrasounds. Electronically Signed   By: Charlett Nose M.D.   On: 11/27/2019 19:06    Procedures Procedures (including critical care time)  Medications Ordered in UC Medications - No data to display  Initial Impression / Assessment and Plan / UC Course  I have reviewed the triage vital signs and the nursing notes.  Pertinent labs & imaging results that were available during my care of the patient were reviewed by me and considered in my medical decision making (see chart for details).    24 year old female presents with vaginal bleeding in early pregnancy.  Beta quant was 20.  Ultrasound was obtained and revealed the absence of an intrauterine pregnancy.  No apparent ectopic was noted as well.  Given low value of beta hCG, I believe that the patient is having a  miscarriage.  No need for further intervention at this time.  Advised follow-up with OB/GYN for repeat quant in 2 days.  This was discussed in depth with the patient and patient was appreciative for care provided.  She states that she will follow-up with her OB/GYN.  Final Clinical Impressions(s) / UC Diagnoses   Final diagnoses:  Vaginal bleeding in pregnancy     Discharge Instructions     Go to the medical mall at [facility]. They will screen you and send you to [place] (if no one is there, use the phone).  I will call.  Take care  Dr. Adriana Simas    ED Prescriptions    None     PDMP not reviewed this encounter.   Tommie Sams, [place] 11/27/19 1936

## 2019-11-27 NOTE — ED Triage Notes (Signed)
Pt reports she found out she was pregnant yesterday ([redacted] weeks pregnant). Today is having left lower quad cramping and bleeding.

## 2019-12-06 DIAGNOSIS — M65871 Other synovitis and tenosynovitis, right ankle and foot: Secondary | ICD-10-CM | POA: Diagnosis not present

## 2019-12-08 DIAGNOSIS — M25571 Pain in right ankle and joints of right foot: Secondary | ICD-10-CM | POA: Diagnosis not present

## 2019-12-08 DIAGNOSIS — M25671 Stiffness of right ankle, not elsewhere classified: Secondary | ICD-10-CM | POA: Diagnosis not present

## 2019-12-14 ENCOUNTER — Ambulatory Visit: Payer: Self-pay

## 2019-12-14 ENCOUNTER — Ambulatory Visit: Payer: BC Managed Care – PPO | Attending: Internal Medicine

## 2019-12-14 DIAGNOSIS — Z20822 Contact with and (suspected) exposure to covid-19: Secondary | ICD-10-CM | POA: Diagnosis not present

## 2019-12-15 LAB — NOVEL CORONAVIRUS, NAA: SARS-CoV-2, NAA: NOT DETECTED

## 2019-12-18 ENCOUNTER — Telehealth: Payer: Self-pay

## 2019-12-18 ENCOUNTER — Ambulatory Visit: Payer: Self-pay | Admitting: Neurology

## 2019-12-18 NOTE — Telephone Encounter (Signed)
Patient called and stated that she was scheduled to see Dr. Lucia Gaskins today(12-18-2019) but due to a recent covid exposure she was unable to come to her appt today. I wasn't able to find another new patient slot to place her in. Patient would like a call back to r/s her appt.

## 2019-12-19 NOTE — Telephone Encounter (Signed)
Pt has been rescheduled. 

## 2019-12-26 DIAGNOSIS — N912 Amenorrhea, unspecified: Secondary | ICD-10-CM | POA: Diagnosis not present

## 2019-12-28 DIAGNOSIS — O039 Complete or unspecified spontaneous abortion without complication: Secondary | ICD-10-CM | POA: Diagnosis not present

## 2020-01-01 DIAGNOSIS — M25671 Stiffness of right ankle, not elsewhere classified: Secondary | ICD-10-CM | POA: Diagnosis not present

## 2020-01-01 DIAGNOSIS — M65871 Other synovitis and tenosynovitis, right ankle and foot: Secondary | ICD-10-CM | POA: Diagnosis not present

## 2020-01-01 DIAGNOSIS — M25571 Pain in right ankle and joints of right foot: Secondary | ICD-10-CM | POA: Diagnosis not present

## 2020-01-03 DIAGNOSIS — N912 Amenorrhea, unspecified: Secondary | ICD-10-CM | POA: Diagnosis not present

## 2020-01-08 DIAGNOSIS — M25571 Pain in right ankle and joints of right foot: Secondary | ICD-10-CM | POA: Diagnosis not present

## 2020-01-08 DIAGNOSIS — M25671 Stiffness of right ankle, not elsewhere classified: Secondary | ICD-10-CM | POA: Diagnosis not present

## 2020-01-11 DIAGNOSIS — N912 Amenorrhea, unspecified: Secondary | ICD-10-CM | POA: Diagnosis not present

## 2020-01-11 DIAGNOSIS — Z3201 Encounter for pregnancy test, result positive: Secondary | ICD-10-CM | POA: Diagnosis not present

## 2020-01-16 NOTE — Progress Notes (Signed)
[facility]    Provider:  Dr Lucia Gaskins Requesting Provider: Doren Custard, FNP Primary Care Provider:  Doren Custard, FNP  CC:  Right sided facial pain, vertigo, decreased hearing  HPI:  Janet Rich is a 24 y.o. female here as requested by Doren Custard, FNP for trigeminal neuralgia. PMHx PCOS, depression, headache, back pain, bipolar 2, anxiety, right trigeminal neuralgia.  I reviewed notes by Maurice Small, for over a year now patient has had some right otalgia, some shooting pain into the proximal jaw, on and off, when she was last seen had been occurring for about 10 days, she also reported some itching in the ear canal and the pain is quite deep in her ear, also endorsed nasal congestion, but no cough chest pain shortness of breath fevers chills or loss of taste and smell.  She was treated for right otitis media with amoxicillin.  She was referred to neurology to evaluate for possible trigeminal neuralgia.  I reviewed other notes in epic and in care everywhere, I did find a CT of the head from 2013 which was normal, no acute intracranial hemorrhage or infarct, reviewed report, for headache, reviewed report.   She states if she uses something to clean her ear or any pressure on the ear causes a shooting pain that radiates in front of the ear doesn't really shoot in the face and hurts in front of the ear, brief and severe but then pain lingers, she saw an ENT and everything is fine, it has been ongoing for a few years, if she rubs behind her ear it starts hurting, sensitive even to touch, when it started she was cleaning her ear no trauma, happens daily if she is not carefull, can be a dull pain and ear feels full, like something pressing on it. She can live with it and just wants to make sure everything is ok. She is [redacted] weeks pregnant with twins. Has been stable, not progressing, staying the same.But it does worry her. She has some associated vertigo. She has a 26 year old and now  is pregnant with twins. She has decreased hearing on the right as well, no weakness. No other focal neurologic deficits, associated symptoms, inciting events or modifiable factors.  Reviewed notes, labs and imaging from outside physicians, which showed: see above  Cbc/cmp unremarkable 09/2019. Will request images from prior CD however it is over 7 years, will try.   Review of Systems: Patient complains of symptoms per HPI as well as the following symptoms: face pain. Pertinent negatives and positives per HPI. All others negative.   Social History   Socioeconomic History  . Marital status: Married    Spouse name: Renae Fickle  . Number of children: 1  . Years of education: Not on file  . Highest education level: Associate degree: academic program  Occupational History  . Occupation: waitress  Tobacco Use  . Smoking status: Former Smoker    Types: Cigarettes    Quit date: 12/20/2009    Years since quitting: 10.0  . Smokeless tobacco: Never Used  . Tobacco comment: age [age], quit  Substance and Sexual Activity  . Alcohol use: Not Currently  . Drug use: No  . Sexual activity: Yes    Partners: Female, Female    Birth control/protection: None    Comment: last sex mid Jan 2019  Other Topics Concern  . Not on file  Social History Narrative   Lives at home with husband and son  Right handed   Currently [redacted] weeks pregnant with twins (as of 01/17/2020)   Caffeine: none   Social Determinants of Health   Financial Resource Strain:   . Difficulty of Paying Living Expenses:   Food Insecurity:   . Worried About Charity fundraiser in the Last Year:   . Arboriculturist in the Last Year:   Transportation Needs:   . Film/video editor (Medical):   Marland Kitchen Lack of Transportation (Non-Medical):   Physical Activity:   . Days of Exercise per Week:   . Minutes of Exercise per Session:   Stress:   . Feeling of Stress :   Social Connections:   . Frequency of Communication with Friends and Family:     . Frequency of Social Gatherings with Friends and Family:   . Attends Religious Services:   . Active Member of Clubs or Organizations:   . Attends Archivist Meetings:   Marland Kitchen Marital Status:   Intimate Partner Violence:   . Fear of Current or Ex-Partner:   . Emotionally Abused:   Marland Kitchen Physically Abused:   . Sexually Abused:     Family History  Problem Relation Age of Onset  . Endometriosis Mother   . Bipolar disorder Mother   . Diabetes Mother        prediabetes  . Breast cancer Paternal Grandmother   . Cancer Paternal Grandmother   . Depression Paternal Grandmother   . Schizophrenia Father   . Depression Father   . Heart disease Neg Hx   . Colon cancer Neg Hx   . Ovarian cancer Neg Hx     Past Medical History:  Diagnosis Date  . Anemia    with periods  . Ankle fracture    right  . Anxiety   . Asthma   . Back pain   . Depression   . Endometriosis   . GERD (gastroesophageal reflux disease)   . Headache   . Ovarian cyst   . PCOS (polycystic ovarian syndrome)   . Pneumonia    age 16  . PONV (postoperative nausea and vomiting)    nausea after wisdom teeth extraction  . Vaginal Pap smear, abnormal    bx ok    Patient Active Problem List   Diagnosis Date Noted  . Right trigeminal neuralgia 11/08/2019  . Endometriosis 03/27/2019  . PCOS (polycystic ovarian syndrome) 03/27/2019  . Moderate persistent asthma 03/07/2019  . Post-nasal drainage 03/07/2019  . Irritable bowel syndrome with diarrhea 03/07/2019  . Gastroesophageal reflux disease without esophagitis 03/07/2019  . Class 2 obesity due to excess calories without serious comorbidity with body mass index (BMI) of 38.0 to 38.9 in adult 09/02/2018  . Bipolar 2 disorder ([place]) 08/12/2018  . History of low birth weight 03/11/2018  . Vulvar atrophy 03/11/2018  . Dysmenorrhea 05/01/2015  . History of migraine headaches 05/01/2015  . Anxiety and depression 05/01/2015    Past Surgical History:  Procedure  Laterality Date  . FRACTURE SURGERY     ankle surgery with impant   . HYSTEROSCOPY WITH D & C N/A 09/22/2019   Procedure: DILATATION AND CURETTAGE /HYSTEROSCOPY;  Surgeon: Ward, Honor Loh, MD;  Location: [facility];  Service: Gynecology;  Laterality: N/A;  . ROBOTIC ASSISTED DIAGNOSTIC LAPAROSCOPY N/A 02/18/2017   Procedure: ROBOTIC ASSISTED DIAGNOSTIC LAPAROSCOPY,EXCISION AND ABLATION OF ENDOMETRIOSIS,LYSIS OF ADHESIONS;  Surgeon: Brien Few, MD;  Location: [facility];  Service: Gynecology;  Laterality: N/A;  . [place] EXTRACTION  2013  Current Outpatient Medications  Medication Sig Dispense Refill  . acetaminophen (TYLENOL) 500 MG tablet Take 500 mg by mouth every 6 (six) hours as needed for headache.    . albuterol (PROVENTIL HFA;VENTOLIN HFA) 108 (90 Base) MCG/ACT inhaler Inhale 1-2 puffs into the lungs every 6 (six) hours as needed for wheezing or shortness of breath. 1 Inhaler 0  . albuterol (PROVENTIL) (2.5 MG/3ML) 0.083% nebulizer solution Take 3 mLs (2.5 mg total) by nebulization every 6 (six) hours as needed for wheezing or shortness of breath. 150 mL 1  . budesonide-formoterol (SYMBICORT) 80-4.5 MCG/ACT inhaler Inhale 2 puffs into the lungs 2 (two) times daily. 1 Inhaler 3  . doxylamine, Sleep, (UNISOM) 25 MG tablet Take 12.5 mg by mouth in the morning and at bedtime. Plus 1 tablet at bedtime.    . lamoTRIgine (LAMICTAL) 200 MG tablet Take 200 mg by mouth daily.     . Prenatal Vit-Fe Fumarate-FA (PRENATAL MULTIVITAMIN) TABS tablet Take 2 tablets by mouth daily.    . promethazine (PHENERGAN) 12.5 MG tablet Take 12.5-25 mg by mouth every 4 (four) hours as needed.     . Pyridoxine HCl (VITAMIN B-6 PO) Take by mouth.     No current facility-administered medications for this visit.    Allergies as of 01/17/2020 - Review Complete 01/17/2020  Allergen Reaction Noted  . Amoxicillin Shortness Of Breath 11/13/2019  . Other  02/10/2017  . Latex Rash 05/01/2015    Vitals: BP 119/81  (BP Location: Left Arm, Patient Position: Sitting)   Pulse 87   Temp 97.6 F (36.4 C) Comment: taken at front  Ht 5\' 1"  (1.549 m)   Wt 203 lb (92.1 kg)   LMP 11/27/2019   Breastfeeding No   BMI 38.36 kg/m  Last Weight:  Wt Readings from Last 1 Encounters:  01/17/20 203 lb (92.1 kg)   Last Height:   Ht Readings from Last 1 Encounters:  01/17/20 5\' 1"  (1.549 m)     Physical exam: Exam: Gen: NAD, conversant, well nourised, obese, well groomed                     CV: RRR, no MRG. No Carotid Bruits. No peripheral edema, warm, nontender Eyes: Conjunctivae clear without exudates or hemorrhage  Neuro: Detailed Neurologic Exam  Speech:    Speech is normal; fluent and spontaneous with normal comprehension.  Cognition:    The patient is oriented to person, place, and time;     recent and remote memory intact;     language fluent;     normal attention, concentration,     fund of knowledge Cranial Nerves:    The pupils are equal, round, and reactive to light. The fundi are normal and spontaneous venous pulsations are present. Visual fields are full to finger confrontation. Extraocular movements are intact. Trigeminal sensation is intact and the muscles of mastication are normal. The face is symmetric. The palate elevates in the midline. Hearing intact. Voice is normal. Shoulder shrug is normal. The tongue has normal motion without fasciculations.   Coordination:    Normal finger to nose and heel to shin.   Gait:  Normal native gait  Motor Observation:    No asymmetry, no atrophy, and no involuntary movements noted. Tone:    Normal muscle tone.    Posture:    Posture is normal. normal erect    Strength:    Strength is V/V in the upper and lower limbs.  Sensation: intact to LT     Reflex Exam:  DTR's:    Deep tendon reflexes in the upper and lower extremities are normal bilaterally.   Toes:    The toes are downgoing bilaterally.   Clonus:    Clonus is absent.      Assessment/Plan:  Really lovely 24 year old with trigeminal neuralgia, vertigo and decreased hearing. Given symptoms it is very important for her to have imaging to evaluate for   24 year old female with no significant PMHx with numbness of the trigeminal nerve in the left V3 distribution for 6 months. No pain, no lesions. Will order an MRI of the brain w/wo contrast to evaluate for possible ischaemic or haemorrhagic lesions of the brainstem that can cause isolated cranial nerve palsies; small lesions of the brainstem can affect the trigeminal nerve only. Other causes include including traumatic, vascular,inflammatory, demyelinating, infectious, and neoplastic. Will check renal function for the contrast. Will start a medrol dosepak. Has associated hearing loss and left ear pain, will refer to ENT for evaluation of possible ischemic or hemorrhagic lesions of the brainstem that can cause isolated cranial nerve palsies, inner ear lesions/petrous apex, schwannoma, vascular complession, multiple sclerosis. Unfortunately patient is [redacted] weeks pregnant, we will order MRI brain wo contrast with thin cute through the [facility] and trigeminal protocol and after she gives birth we can order with and without. Will try to get thin cuts through [facility], MS/Stroke and trigeminal protocols   Orders Placed This Encounter  Procedures  . MR BRAIN WO CONTRAST    Cc: Doren Custard, FNP  Naomie Dean, MD  [facility] [address] [place], [place] [address]  Phone [phone] Fax [phone]

## 2020-01-17 ENCOUNTER — Encounter: Payer: Self-pay | Admitting: Neurology

## 2020-01-17 ENCOUNTER — Ambulatory Visit (INDEPENDENT_AMBULATORY_CARE_PROVIDER_SITE_OTHER): Payer: BC Managed Care – PPO | Admitting: Neurology

## 2020-01-17 VITALS — BP 119/81 | HR 87 | Temp 97.6°F | Ht 61.0 in | Wt 203.0 lb

## 2020-01-17 DIAGNOSIS — H9191 Unspecified hearing loss, right ear: Secondary | ICD-10-CM | POA: Diagnosis not present

## 2020-01-17 DIAGNOSIS — R42 Dizziness and giddiness: Secondary | ICD-10-CM

## 2020-01-17 DIAGNOSIS — G500 Trigeminal neuralgia: Secondary | ICD-10-CM

## 2020-01-17 NOTE — Patient Instructions (Signed)
MRI of the brain w/o contrast Will repeat again prior to December   Trigeminal Neuralgia  Trigeminal neuralgia is a nerve disorder that causes severe pain on one side of the face. The pain may last from a few seconds to several minutes. The pain is usually only on one side of the face. Symptoms may occur for days, weeks, or months and then go away for months or years. The pain may return and be worse than before. What are the causes? This condition is caused by damage or pressure to a nerve in the head that is called the trigeminal nerve. An attack can be triggered by:  Talking.  Chewing.  Putting on makeup.  Washing your face.  Shaving your face.  Brushing your teeth.  Touching your face. What increases the risk? You are more likely to develop this condition if you:  Are 70 years of age or older.  Are female. What are the signs or symptoms? The main symptom of this condition is severe pain in the:  Jaw.  Lips.  Eyes.  Nose.  Scalp.  Forehead.  Face. The pain may be:  Intense.  Stabbing.  Electric.  Shock-like. How is this diagnosed? This condition is diagnosed with a physical exam. A CT scan or an MRI may be done to rule out other conditions that can cause facial pain. How is this treated? This condition may be treated with:  Avoiding the things that trigger your symptoms.  Taking prescription medicines (anticonvulsants).  Having surgery. This may be done in severe cases if other medical treatment does not provide relief.  Having procedures such as ablation, thermal, or radiation therapy. It may take up to one month for treatment to start relieving the pain. Follow these instructions at home: Managing pain  Learn as much as you can about how to manage your pain. Ask your health care provider if a pain specialist would be helpful.  Consider talking with a mental health care provider (psychologist) about how to cope with the pain.  Consider  joining a pain support group. General instructions  Take over-the-counter and prescription medicines only as told by your health care provider.  Avoid the things that trigger your symptoms. It may help to: ? Chew on the unaffected side of your mouth. ? Avoid touching your face. ? Avoid blasts of hot or cold air.  Follow your treatment plan as told by your health care provider. This may include: ? Cognitive or behavioral therapy. ? Gentle, regular exercise. ? Meditation or yoga. ? Aromatherapy.  Keep all follow-up visits as told by your health care provider. You may need to be monitored closely to make sure treatment is working well for you. Where to find more information  Facial Pain Association: fpa-support.org Contact a health care provider if:  Your medicine is not helping your symptoms.  You have side effects from the medicine used for treatment.  You develop new, unexplained symptoms, such as: ? Double vision. ? Facial weakness. ? Facial numbness. ? Changes in hearing or balance.  You feel depressed. Get help right away if:  Your pain is severe and is not getting better.  You develop suicidal thoughts. If you ever feel like you may hurt yourself or others, or have thoughts about taking your own life, get help right away. You can go to your nearest emergency department or call:  Your local emergency services (911 in the [place]  A suicide crisis helpline, such as the National Suicide Prevention Lifeline at  515-449-4294. This is open 24 hours a day. Summary  Trigeminal neuralgia is a nerve disorder that causes severe pain on one side of the face. The pain may last from a few seconds to several minutes.  This condition is caused by damage or pressure to a nerve in the head that is called the trigeminal nerve.  Treatment may include avoiding the things that trigger your symptoms, taking medicines, or having surgery or procedures. It may take up to one month for  treatment to start relieving the pain.  Avoid the things that trigger your symptoms.  Keep all follow-up visits as told by your health care provider. You may need to be monitored closely to make sure treatment is working well for you. This information is not intended to replace advice given to you by your health care provider. Make sure you discuss any questions you have with your health care provider. Document Revised: 08/22/2018 Document Reviewed: 08/22/2018 Elsevier Patient Education  [place].

## 2020-01-24 ENCOUNTER — Telehealth: Payer: Self-pay | Admitting: Neurology

## 2020-01-24 NOTE — Telephone Encounter (Signed)
LVM for pt to call back about scheduling mri  [employer] Auth: NPR Ref # [id number]

## 2020-01-24 NOTE — Telephone Encounter (Signed)
Patient returned my call she is scheduled at [facility] for 02/20/20.

## 2020-02-08 DIAGNOSIS — Z113 Encounter for screening for infections with a predominantly sexual mode of transmission: Secondary | ICD-10-CM | POA: Diagnosis not present

## 2020-02-08 DIAGNOSIS — O30041 Twin pregnancy, dichorionic/diamniotic, first trimester: Secondary | ICD-10-CM | POA: Diagnosis not present

## 2020-02-08 DIAGNOSIS — O30001 Twin pregnancy, unspecified number of placenta and unspecified number of amniotic sacs, first trimester: Secondary | ICD-10-CM | POA: Diagnosis not present

## 2020-02-08 DIAGNOSIS — Z6838 Body mass index (BMI) 38.0-38.9, adult: Secondary | ICD-10-CM | POA: Diagnosis not present

## 2020-02-08 LAB — OB RESULTS CONSOLE RPR: RPR: NONREACTIVE

## 2020-02-08 LAB — OB RESULTS CONSOLE HEPATITIS B SURFACE ANTIGEN: Hepatitis B Surface Ag: NEGATIVE

## 2020-02-08 LAB — OB RESULTS CONSOLE VARICELLA ZOSTER ANTIBODY, IGG: Varicella: NON-IMMUNE/NOT IMMUNE

## 2020-02-08 LAB — OB RESULTS CONSOLE HIV ANTIBODY (ROUTINE TESTING): HIV: NONREACTIVE

## 2020-02-08 LAB — OB RESULTS CONSOLE RUBELLA ANTIBODY, IGM: Rubella: IMMUNE

## 2020-02-12 DIAGNOSIS — F329 Major depressive disorder, single episode, unspecified: Secondary | ICD-10-CM | POA: Diagnosis not present

## 2020-02-12 DIAGNOSIS — F418 Other specified anxiety disorders: Secondary | ICD-10-CM | POA: Diagnosis not present

## 2020-02-12 DIAGNOSIS — Z6838 Body mass index (BMI) 38.0-38.9, adult: Secondary | ICD-10-CM | POA: Diagnosis not present

## 2020-02-12 DIAGNOSIS — O99211 Obesity complicating pregnancy, first trimester: Secondary | ICD-10-CM | POA: Diagnosis not present

## 2020-02-12 DIAGNOSIS — F319 Bipolar disorder, unspecified: Secondary | ICD-10-CM | POA: Diagnosis not present

## 2020-02-14 ENCOUNTER — Telehealth: Payer: Self-pay | Admitting: Licensed Clinical Social Worker

## 2020-02-14 NOTE — Telephone Encounter (Signed)
-----   Message from Yolanda Bonine sent at 02/13/2020  2:53 PM EDT ----- Regarding: Referral Good afternoon,  I recently opened this patient for OB management. She expressed feelings of depression and scored a 10 on the [place] at her most recent doctor's visit. The referral mentioned she also has anxiety and bipolar but I am not sure if that is an official diagnosis.   Let me know if you have any questions.  Thank you! Yolanda Bonine

## 2020-02-19 ENCOUNTER — Emergency Department
Admission: EM | Admit: 2020-02-19 | Discharge: 2020-02-19 | Disposition: A | Discharge status: Home / Self Care | Payer: BC Managed Care – PPO | Attending: Student in an Organized Health Care Education/Training Program | Admitting: Student in an Organized Health Care Education/Training Program

## 2020-02-19 ENCOUNTER — Other Ambulatory Visit: Payer: Self-pay

## 2020-02-19 ENCOUNTER — Encounter: Payer: Self-pay | Admitting: Emergency Medicine

## 2020-02-19 DIAGNOSIS — Z91040 Latex allergy status: Secondary | ICD-10-CM | POA: Insufficient documentation

## 2020-02-19 DIAGNOSIS — O30091 Twin pregnancy, unable to determine number of placenta and number of amniotic sacs, first trimester: Secondary | ICD-10-CM | POA: Insufficient documentation

## 2020-02-19 DIAGNOSIS — Z87891 Personal history of nicotine dependence: Secondary | ICD-10-CM | POA: Diagnosis not present

## 2020-02-19 DIAGNOSIS — O219 Vomiting of pregnancy, unspecified: Secondary | ICD-10-CM | POA: Diagnosis not present

## 2020-02-19 DIAGNOSIS — Z3A11 11 weeks gestation of pregnancy: Secondary | ICD-10-CM | POA: Insufficient documentation

## 2020-02-19 DIAGNOSIS — J45909 Unspecified asthma, uncomplicated: Secondary | ICD-10-CM | POA: Insufficient documentation

## 2020-02-19 DIAGNOSIS — Z79899 Other long term (current) drug therapy: Secondary | ICD-10-CM | POA: Insufficient documentation

## 2020-02-19 DIAGNOSIS — Z3491 Encounter for supervision of normal pregnancy, unspecified, first trimester: Secondary | ICD-10-CM | POA: Insufficient documentation

## 2020-02-19 LAB — CBC
HCT: 37.2 % (ref 36.0–46.0)
Hemoglobin: 12.6 g/dL (ref 12.0–15.0)
MCH: 27.1 pg (ref 26.0–34.0)
MCHC: 33.9 g/dL (ref 30.0–36.0)
MCV: 80 fL (ref 80.0–100.0)
Platelets: 352 10*3/uL (ref 150–400)
RBC: 4.65 MIL/uL (ref 3.87–5.11)
RDW: 14.6 % (ref 11.5–15.5)
WBC: 9.6 10*3/uL (ref 4.0–10.5)
nRBC: 0 % (ref 0.0–0.2)

## 2020-02-19 LAB — BASIC METABOLIC PANEL
Anion gap: 9 (ref 5–15)
BUN: 7 mg/dL (ref 6–20)
CO2: 22 mmol/L (ref 22–32)
Calcium: 8.9 mg/dL (ref 8.9–10.3)
Chloride: 100 mmol/L (ref 98–111)
Creatinine, Ser: 0.37 mg/dL — ABNORMAL LOW (ref 0.44–1.00)
GFR calc Af Amer: >60 mL/min (ref >60)
GFR calc non Af Amer: >60 mL/min (ref >60)
Glucose, Bld: 87 mg/dL (ref 70–99)
Potassium: 3.7 mmol/L (ref 3.5–5.1)
Sodium: 131 mmol/L — ABNORMAL LOW (ref 135–145)

## 2020-02-19 LAB — URINALYSIS, ROUTINE W REFLEX MICROSCOPIC
Glucose, UA: NEGATIVE mg/dL
Hgb urine dipstick: NEGATIVE
Ketones, ur: >160 mg/dL — AB
Leukocytes,Ua: NEGATIVE
Nitrite: NEGATIVE
Specific Gravity, Urine: 1.02 (ref 1.005–1.030)
pH: 7 (ref 5.0–8.0)

## 2020-02-19 LAB — URINALYSIS, MICROSCOPIC (REFLEX)
Bacteria, UA: NONE SEEN
RBC / HPF: NONE SEEN RBC/hpf (ref 0–5)
WBC, UA: NONE SEEN WBC/hpf (ref 0–5)

## 2020-02-19 LAB — HEPATIC FUNCTION PANEL
ALT: 13 U/L (ref 0–44)
AST: 14 U/L — ABNORMAL LOW (ref 15–41)
Albumin: 3.4 g/dL — ABNORMAL LOW (ref 3.5–5.0)
Alkaline Phosphatase: 60 U/L (ref 38–126)
Bilirubin, Direct: <0.1 mg/dL (ref 0.0–0.2)
Total Bilirubin: 0.6 mg/dL (ref 0.3–1.2)
Total Protein: 7.6 g/dL (ref 6.5–8.1)

## 2020-02-19 LAB — LIPASE, BLOOD: Lipase: 21 U/L (ref 11–51)

## 2020-02-19 LAB — HCG, QUANTITATIVE, PREGNANCY: hCG, Beta Chain, Quant, S: 123015 m[IU]/mL — ABNORMAL HIGH (ref <5)

## 2020-02-19 MED ORDER — METOCLOPRAMIDE HCL 10 MG PO TABS: 10.0000 mg | ORAL_TABLET | Freq: Four times a day (QID) | ORAL | 0 refills | Status: DC | PRN

## 2020-02-19 MED ORDER — DEXTROSE-NACL 5-0.9 % IV SOLN
1000.0000 mL | Freq: Once | INTRAVENOUS | Status: AC
  Administered 2020-02-19: 1000 mL via INTRAVENOUS

## 2020-02-19 MED ORDER — PROMETHAZINE HCL 25 MG/ML IJ SOLN
12.5000 mg | Freq: Four times a day (QID) | INTRAMUSCULAR | Status: DC | PRN
  Administered 2020-02-19: 12.5 mg via INTRAVENOUS
  Filled 2020-02-19: qty 1

## 2020-02-19 MED ORDER — SODIUM CHLORIDE 0.9 % IV BOLUS
1000.0000 mL | Freq: Once | INTRAVENOUS | Status: AC
  Administered 2020-02-19: 1000 mL via INTRAVENOUS

## 2020-02-19 MED ORDER — ACETAMINOPHEN 500 MG PO TABS
1000.0000 mg | ORAL_TABLET | Freq: Once | ORAL | Status: AC
  Administered 2020-02-19: 1000 mg via ORAL
  Filled 2020-02-19: qty 2

## 2020-02-19 NOTE — ED Notes (Signed)
First Nurse Note: Pt to ED via POV, pt states that her OBGYN told her to come to the ED for possible dehydration.   Pt is [redacted] weeks pregnant, states that she has been unable to keep anything down for the past 2 days. Pt is in NAD.

## 2020-02-19 NOTE — ED Triage Notes (Signed)
Pt states [redacted] weeks pregnant, has been unable to keep anything down for the past few days, is pregnant with twins, had vomiting with first pregnancy as well. NAD. Pt states lower abd cramping and leg cramping as well, also pounding headache.

## 2020-02-19 NOTE — ED Notes (Addendum)
Pt denies needs currently. Called lab. State they will add on hepatic func and lipase now.

## 2020-02-19 NOTE — ED Provider Notes (Signed)
[facility] Emergency Department Provider Note    First MD Initiated Contact with Patient 02/19/20 1125     (approximate)  I have reviewed the triage vital signs and the nursing notes.   HISTORY  Chief Complaint Emesis During Pregnancy    HPI Glady Ouderkirk is a 24 y.o. female presents the ER for evaluation of persistent nausea and vomiting.  Has a history of morning sickness.  Feels like her prescription for Phenergan is not helping.  She is not reporting any specific abdominal pain.  No flu.  Dysuria.  Denies any vaginal bleeding or discharge.  Poorly is having twin pregnancy.    Past Medical History:  Diagnosis Date  . Anemia    with periods  . Ankle fracture    right  . Anxiety   . Asthma   . Back pain   . Depression   . Endometriosis   . GERD (gastroesophageal reflux disease)   . Headache   . Ovarian cyst   . PCOS (polycystic ovarian syndrome)   . Pneumonia    age 78  . PONV (postoperative nausea and vomiting)    nausea after wisdom teeth extraction  . Vaginal Pap smear, abnormal    bx ok   Family History  Problem Relation Age of Onset  . Endometriosis Mother   . Bipolar disorder Mother   . Diabetes Mother        prediabetes  . Breast cancer Paternal Grandmother   . Cancer Paternal Grandmother   . Depression Paternal Grandmother   . Schizophrenia Father   . Depression Father   . Heart disease Neg Hx   . Colon cancer Neg Hx   . Ovarian cancer Neg Hx    Past Surgical History:  Procedure Laterality Date  . FRACTURE SURGERY     ankle surgery with impant   . HYSTEROSCOPY WITH D & C N/A 09/22/2019   Procedure: DILATATION AND CURETTAGE /HYSTEROSCOPY;  Surgeon: Ward, Elenora Fender, MD;  Location: [facility] ORS;  Service: Gynecology;  Laterality: N/A;  . ROBOTIC ASSISTED DIAGNOSTIC LAPAROSCOPY N/A 02/18/2017   Procedure: ROBOTIC ASSISTED DIAGNOSTIC LAPAROSCOPY,EXCISION AND ABLATION OF ENDOMETRIOSIS,LYSIS OF ADHESIONS;  Surgeon: Olivia Mackie,  MD;  Location: [facility] ORS;  Service: Gynecology;  Laterality: N/A;  . WISDOM TOOTH EXTRACTION  2013   Patient Active Problem List   Diagnosis Date Noted  . Right trigeminal neuralgia 11/08/2019  . Endometriosis 03/27/2019  . PCOS (polycystic ovarian syndrome) 03/27/2019  . Moderate persistent asthma 03/07/2019  . Post-nasal drainage 03/07/2019  . Irritable bowel syndrome with diarrhea 03/07/2019  . Gastroesophageal reflux disease without esophagitis 03/07/2019  . Class 2 obesity due to excess calories without serious comorbidity with body mass index (BMI) of 38.0 to 38.9 in adult 09/02/2018  . Bipolar 2 disorder (HCC) 08/12/2018  . History of low birth weight 03/11/2018  . Vulvar atrophy 03/11/2018  . Dysmenorrhea 05/01/2015  . History of migraine headaches 05/01/2015  . Anxiety and depression 05/01/2015      Prior to Admission medications   Medication Sig Start Date End Date Taking? Authorizing Provider  acetaminophen (TYLENOL) 500 MG tablet Take 500 mg by mouth every 6 (six) hours as needed for headache.    [provider]  albuterol (PROVENTIL HFA;VENTOLIN HFA) 108 (90 Base) MCG/ACT inhaler Inhale 1-2 puffs into the lungs every 6 (six) hours as needed for wheezing or shortness of breath. 11/19/18   Domenick Gong, MD  albuterol (PROVENTIL) (2.5 MG/3ML) 0.083% nebulizer solution Take 3  mLs (2.5 mg total) by nebulization every 6 (six) hours as needed for wheezing or shortness of breath. 01/30/19   Doren Custard, FNP  budesonide-formoterol (SYMBICORT) 80-4.5 MCG/ACT inhaler Inhale 2 puffs into the lungs 2 (two) times daily. 06/23/19   Doren Custard, FNP  doxylamine, Sleep, (UNISOM) 25 MG tablet Take 12.5 mg by mouth in the morning and at bedtime. Plus 1 tablet at bedtime.    [provider]  lamoTRIgine (LAMICTAL) 200 MG tablet Take 200 mg by mouth daily.     [provider]  metoCLOPramide (REGLAN) 10 MG tablet Take 1 tablet (10 mg total) by mouth every 6 (six)  hours as needed for nausea. 02/19/20 02/18/21  Willy Eddy, MD  Prenatal Vit-Fe Fumarate-FA (PRENATAL MULTIVITAMIN) TABS tablet Take 2 tablets by mouth daily.    [provider]  promethazine (PHENERGAN) 12.5 MG tablet Take 12.5-25 mg by mouth every 4 (four) hours as needed.  01/11/20   [provider]  Pyridoxine HCl (VITAMIN B-6 PO) Take by mouth.    [provider]    Allergies Amoxicillin, Other, and Latex    Social History Social History   Tobacco Use  . Smoking status: Former Smoker    Types: Cigarettes    Quit date: 12/20/2009    Years since quitting: 10.1  . Smokeless tobacco: Never Used  . Tobacco comment: age [age], quit  Substance Use Topics  . Alcohol use: Not Currently  . Drug use: No    Review of Systems Patient denies headaches, rhinorrhea, blurry vision, numbness, shortness of breath, chest pain, edema, cough, abdominal pain, nausea, vomiting, diarrhea, dysuria, fevers, rashes or hallucinations unless otherwise stated above in HPI. ____________________________________________   PHYSICAL EXAM:  VITAL SIGNS: Vitals:   02/19/20 1530 02/19/20 1545  BP:    Pulse: 94 81  Resp:    Temp:    SpO2: 100% 100%    Constitutional: Alert and oriented.  Eyes: Conjunctivae are normal.  Head: Atraumatic. Nose: No congestion/rhinnorhea. Mouth/Throat: Mucous membranes are moist.   Neck: No stridor. Painless ROM.  Cardiovascular: Normal rate, regular rhythm. Grossly normal heart sounds.  Good peripheral circulation. Respiratory: Normal respiratory effort.  No retractions. Lungs CTAB. Gastrointestinal: Soft and nontender. No distention. No abdominal bruits. No CVA tenderness. Genitourinary: deferred Musculoskeletal: No lower extremity tenderness nor edema.  No joint effusions. Neurologic:  Normal speech and language. No gross focal neurologic deficits are appreciated. No facial droop Skin:  Skin is warm, dry and intact. No rash  noted. Psychiatric: Mood and affect are normal. Speech and behavior are normal.  ____________________________________________   LABS (all labs ordered are listed, but only abnormal results are displayed)  Results for orders placed or performed during the hospital encounter of 02/19/20 (from the past 24 hour(s))  CBC     Status: None   Collection Time: 02/19/20  9:58 AM  Result Value Ref Range   WBC 9.6 4.0 - 10.5 K/uL   RBC 4.65 3.87 - 5.11 MIL/uL   Hemoglobin 12.6 12.0 - 15.0 g/dL   HCT 24.2 35.3 - 61.4 %   MCV 80.0 80.0 - 100.0 fL   MCH 27.1 26.0 - 34.0 pg   MCHC 33.9 30.0 - 36.0 g/dL   RDW 43.1 54.0 - 08.6 %   Platelets 352 150 - 400 K/uL   nRBC 0.0 0.0 - 0.2 %  Basic metabolic panel     Status: Abnormal   Collection Time: 02/19/20  9:58 AM  Result Value Ref  Range   Sodium 131 (L) 135 - 145 mmol/L   Potassium 3.7 3.5 - 5.1 mmol/L   Chloride 100 98 - 111 mmol/L   CO2 22 22 - 32 mmol/L   Glucose, Bld 87 70 - 99 mg/dL   BUN 7 6 - 20 mg/dL   Creatinine, Ser 0.37 (L) 0.44 - 1.00 mg/dL   Calcium 8.9 8.9 - 10.3 mg/dL   GFR calc non Af Amer >60 >60 mL/min   GFR calc Af Amer >60 >60 mL/min   Anion gap 9 5 - 15  Urinalysis, Routine w reflex microscopic     Status: Abnormal   Collection Time: 02/19/20  9:58 AM  Result Value Ref Range   Color, Urine YELLOW YELLOW   APPearance HAZY (A) CLEAR   Specific Gravity, Urine 1.020 1.005 - 1.030   pH 7.0 5.0 - 8.0   Glucose, UA NEGATIVE NEGATIVE mg/dL   Hgb urine dipstick NEGATIVE NEGATIVE   Bilirubin Urine SMALL (A) NEGATIVE   Ketones, ur >160 (A) NEGATIVE mg/dL   Protein, ur TRACE (A) NEGATIVE mg/dL   Nitrite NEGATIVE NEGATIVE   Leukocytes,Ua NEGATIVE NEGATIVE  hCG, quantitative, pregnancy     Status: Abnormal   Collection Time: 02/19/20  9:58 AM  Result Value Ref Range   hCG, Beta Chain, Quant, S 123,015 (H) <5 mIU/mL  Hepatic function panel     Status: Abnormal   Collection Time: 02/19/20  9:58 AM  Result Value Ref Range    Total Protein 7.6 6.5 - 8.1 g/dL   Albumin 3.4 (L) 3.5 - 5.0 g/dL   AST 14 (L) 15 - 41 U/L   ALT 13 0 - 44 U/L   Alkaline Phosphatase 60 38 - 126 U/L   Total Bilirubin 0.6 0.3 - 1.2 mg/dL   Bilirubin, Direct <0.1 0.0 - 0.2 mg/dL   Indirect Bilirubin NOT CALCULATED 0.3 - 0.9 mg/dL  Lipase, blood     Status: None   Collection Time: 02/19/20  9:58 AM  Result Value Ref Range   Lipase 21 11 - 51 U/L  Urinalysis, Microscopic (reflex)     Status: None   Collection Time: 02/19/20  9:58 AM  Result Value Ref Range   RBC / HPF NONE SEEN 0 - 5 RBC/hpf   WBC, UA NONE SEEN 0 - 5 WBC/hpf   Bacteria, UA NONE SEEN NONE SEEN   Squamous Epithelial / LPF 0-5 0 - 5   ____________________________________________  EKG____________________________________________  RADIOLOGY  EMERGENCY DEPARTMENT [place] PREGNANCY "Study: Limited Ultrasound of the Pelvis for Pregnancy"  INDICATIONS:Pregnancy(required) Multiple views of the uterus and pelvic cavity were obtained in real-time with a multi-frequency probe.  APPROACH:Transabdominal  PERFORMED BY: Myself IMAGES ARCHIVED?: No LIMITATIONS: none PREGNANCY FREE FLUID: Present ADNEXAL FINDINGS:none GESTATIONAL AGE, ESTIMATE:  FETAL HEART RATE: A 165, B 162 INTERPRETATION: Fetal heart activity seen     ____________________________________________   PROCEDURES  Procedure(s) performed:  Procedures    Critical Care performed: no ____________________________________________   INITIAL IMPRESSION / ASSESSMENT AND PLAN / ED COURSE  Pertinent labs & imaging results that were available during my care of the patient were reviewed by me and considered in my medical decision making (see chart for details).   DDX: Hyperemesis, morning sickness, dehydration, electrolyte abnormality  Debria Broecker is a 24 y.o. who presents to the ED with evidence of dehydration in the setting of early pregnancy with morning sickness.  Will provide IV fluids as well as IV  antiemetics.  Her abdominal exam  is soft and benign.  Will reassess.  Clinical Course as of Feb 18 1558  Mon Feb 19, 2020  1449 Nausea improved.  Patient requesting something to eat or drink.  Does have significant ketones in urine.  Expect that the D5 should help with this.   [PR]  1522 Patient feels significantly improved.  Requesting to try some applesauce.   [PR]  1559 Patient tolerating p.o. feels much improved.  At this point do feel she is stable and appropriate for outpatient follow-up.   [PR]    Clinical Course User Index [PR] Willy Eddy, MD    The patient was evaluated in Emergency Department today for the symptoms described in the history of present illness. He/she was evaluated in the context of the global COVID-19 pandemic, which necessitated consideration that the patient might be at risk for infection with the SARS-CoV-2 virus that causes COVID-19. Institutional protocols and algorithms that pertain to the evaluation of patients at risk for COVID-19 are in a state of rapid change based on information released by regulatory bodies including the CDC and federal and state organizations. These policies and algorithms were followed during the patient's care in the ED.  As part of my medical decision making, I reviewed the following data within the electronic MEDICAL RECORD NUMBER Nursing notes reviewed and incorporated, Labs reviewed, notes from prior ED visits and [place] Controlled Substance Database   ____________________________________________   FINAL CLINICAL IMPRESSION(S) / ED DIAGNOSES  Final diagnoses:  Nausea and vomiting in pregnancy      NEW MEDICATIONS STARTED DURING THIS VISIT:  New Prescriptions   METOCLOPRAMIDE (REGLAN) 10 MG TABLET    Take 1 tablet (10 mg total) by mouth every 6 (six) hours as needed for nausea.     Note:  This document was prepared using Dragon voice recognition software and may include unintentional dictation errors.    Willy Eddy, MD 02/19/20 [phone]

## 2020-02-19 NOTE — ED Notes (Signed)
Pt given apple sauce and another gingerale.

## 2020-02-19 NOTE — ED Notes (Signed)
Pt walking in room. Steady.

## 2020-02-19 NOTE — ED Notes (Signed)
Called lab. State they will add on hcg preg now.

## 2020-02-19 NOTE — ED Notes (Signed)
Pt given more warm blankets. Bed locked low. Rail up. Call bell within reach. Denies any other needs currently.

## 2020-02-19 NOTE — ED Notes (Addendum)
See triage note. Pt alert and sitting calmly in bed. In NAD currently. Pt reports 7 weeks of nausea and vomiting. States placed on zofran which has helped minimally. Denies major abdominal pain. C/o constipation while on zofran.

## 2020-02-20 ENCOUNTER — Telehealth: Payer: Self-pay | Admitting: Neurology

## 2020-02-20 ENCOUNTER — Other Ambulatory Visit: Payer: BC Managed Care – PPO

## 2020-02-20 NOTE — Telephone Encounter (Signed)
Patient called to cancel/reschedule her MRI apt

## 2020-02-20 NOTE — Telephone Encounter (Signed)
I called and left a voicemail for the patient to call back to reschedule her MRI.

## 2020-02-21 DIAGNOSIS — R112 Nausea with vomiting, unspecified: Secondary | ICD-10-CM | POA: Diagnosis not present

## 2020-02-28 ENCOUNTER — Other Ambulatory Visit: Payer: Self-pay

## 2020-02-28 ENCOUNTER — Ambulatory Visit: Payer: Medicaid Other | Admitting: Licensed Clinical Social Worker

## 2020-02-28 ENCOUNTER — Encounter: Payer: Self-pay | Admitting: Licensed Clinical Social Worker

## 2020-02-28 DIAGNOSIS — F411 Generalized anxiety disorder: Secondary | ICD-10-CM

## 2020-02-28 DIAGNOSIS — O210 Mild hyperemesis gravidarum: Secondary | ICD-10-CM | POA: Diagnosis not present

## 2020-02-28 DIAGNOSIS — F3181 Bipolar II disorder: Secondary | ICD-10-CM
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
## 2020-02-29 NOTE — Assessment & Plan Note (Signed)
Refer to visit note on 02/28/2020.

## 2020-03-01 ENCOUNTER — Other Ambulatory Visit: Payer: Self-pay

## 2020-03-01 ENCOUNTER — Encounter: Payer: Self-pay | Admitting: Emergency Medicine

## 2020-03-01 DIAGNOSIS — E860 Dehydration: Secondary | ICD-10-CM | POA: Diagnosis not present

## 2020-03-01 DIAGNOSIS — O210 Mild hyperemesis gravidarum: Secondary | ICD-10-CM | POA: Diagnosis not present

## 2020-03-01 DIAGNOSIS — Z3A12 12 weeks gestation of pregnancy: Secondary | ICD-10-CM | POA: Diagnosis not present

## 2020-03-01 DIAGNOSIS — Z79899 Other long term (current) drug therapy: Secondary | ICD-10-CM | POA: Insufficient documentation

## 2020-03-01 DIAGNOSIS — O99281 Endocrine, nutritional and metabolic diseases complicating pregnancy, first trimester: Secondary | ICD-10-CM | POA: Diagnosis not present

## 2020-03-01 DIAGNOSIS — J45909 Unspecified asthma, uncomplicated: Secondary | ICD-10-CM | POA: Insufficient documentation

## 2020-03-01 DIAGNOSIS — O99511 Diseases of the respiratory system complicating pregnancy, first trimester: Secondary | ICD-10-CM | POA: Insufficient documentation

## 2020-03-01 DIAGNOSIS — O30041 Twin pregnancy, dichorionic/diamniotic, first trimester: Secondary | ICD-10-CM | POA: Diagnosis not present

## 2020-03-01 DIAGNOSIS — Z87891 Personal history of nicotine dependence: Secondary | ICD-10-CM | POA: Insufficient documentation

## 2020-03-01 LAB — CBC
HCT: 37.5 % (ref 36.0–46.0)
Hemoglobin: 12.4 g/dL (ref 12.0–15.0)
MCH: 26.8 pg (ref 26.0–34.0)
MCHC: 33.1 g/dL (ref 30.0–36.0)
MCV: 81.2 fL (ref 80.0–100.0)
Platelets: 330 10*3/uL (ref 150–400)
RBC: 4.62 MIL/uL (ref 3.87–5.11)
RDW: 14.4 % (ref 11.5–15.5)
WBC: 9.8 10*3/uL (ref 4.0–10.5)
nRBC: 0 % (ref 0.0–0.2)

## 2020-03-01 LAB — COMPREHENSIVE METABOLIC PANEL
ALT: 13 U/L (ref 0–44)
AST: 12 U/L — ABNORMAL LOW (ref 15–41)
Albumin: 3.5 g/dL (ref 3.5–5.0)
Alkaline Phosphatase: 64 U/L (ref 38–126)
Anion gap: 11 (ref 5–15)
BUN: 6 mg/dL (ref 6–20)
CO2: 22 mmol/L (ref 22–32)
Calcium: 9 mg/dL (ref 8.9–10.3)
Chloride: 100 mmol/L (ref 98–111)
Creatinine, Ser: 0.39 mg/dL — ABNORMAL LOW (ref 0.44–1.00)
GFR calc Af Amer: >60 mL/min (ref >60)
GFR calc non Af Amer: >60 mL/min (ref >60)
Glucose, Bld: 88 mg/dL (ref 70–99)
Potassium: 3.8 mmol/L (ref 3.5–5.1)
Sodium: 133 mmol/L — ABNORMAL LOW (ref 135–145)
Total Bilirubin: 0.5 mg/dL (ref 0.3–1.2)
Total Protein: 7.8 g/dL (ref 6.5–8.1)

## 2020-03-01 LAB — LIPASE, BLOOD: Lipase: 21 U/L (ref 11–51)

## 2020-03-01 NOTE — ED Triage Notes (Signed)
Patient states that she has been vomiting and unable to keep anything down. Patient states that she is [redacted] weeks pregnant with twins.

## 2020-03-01 NOTE — ED Notes (Signed)
Rainbow drawn at triage. Pt stated," pain level is a 5." Will notified nurse.

## 2020-03-02 ENCOUNTER — Emergency Department
Admission: EM | Admit: 2020-03-02 | Discharge: 2020-03-02 | Disposition: A | Discharge status: Home / Self Care | Payer: BC Managed Care – PPO | Attending: Emergency Medicine | Admitting: Emergency Medicine

## 2020-03-02 DIAGNOSIS — O30041 Twin pregnancy, dichorionic/diamniotic, first trimester: Secondary | ICD-10-CM | POA: Diagnosis not present

## 2020-03-02 DIAGNOSIS — E860 Dehydration: Secondary | ICD-10-CM

## 2020-03-02 DIAGNOSIS — O210 Mild hyperemesis gravidarum: Secondary | ICD-10-CM

## 2020-03-02 MED ORDER — ONDANSETRON HCL 4 MG/2ML IJ SOLN
4.0000 mg | Freq: Once | INTRAMUSCULAR | Status: DC
  Filled 2020-03-02: qty 2

## 2020-03-02 MED ORDER — DIPHENHYDRAMINE HCL 50 MG/ML IJ SOLN
25.0000 mg | Freq: Once | INTRAMUSCULAR | Status: AC
  Administered 2020-03-02: 25 mg via INTRAVENOUS
  Filled 2020-03-02: qty 1

## 2020-03-02 MED ORDER — LACTATED RINGERS IV BOLUS
1000.0000 mL | Freq: Once | INTRAVENOUS | Status: AC
  Administered 2020-03-02: 1000 mL via INTRAVENOUS

## 2020-03-02 MED ORDER — FOLIC ACID 5 MG/ML IJ SOLN
1.0000 mg | Freq: Once | INTRAMUSCULAR | Status: AC
  Administered 2020-03-02: 1 mg via INTRAVENOUS
  Filled 2020-03-02: qty 0.2

## 2020-03-02 MED ORDER — THIAMINE HCL 100 MG/ML IJ SOLN
100.0000 mg | Freq: Once | INTRAMUSCULAR | Status: AC
  Administered 2020-03-02: 100 mg via INTRAVENOUS
  Filled 2020-03-02: qty 2

## 2020-03-02 MED ORDER — LACTATED RINGERS IV BOLUS
500.0000 mL | Freq: Once | INTRAVENOUS | Status: AC
  Administered 2020-03-02: 500 mL via INTRAVENOUS

## 2020-03-02 MED ORDER — PROCHLORPERAZINE EDISYLATE 10 MG/2ML IJ SOLN
10.0000 mg | Freq: Once | INTRAMUSCULAR | Status: AC
  Administered 2020-03-02: 10 mg via INTRAVENOUS
  Filled 2020-03-02: qty 2

## 2020-03-02 MED ORDER — SODIUM CHLORIDE 0.9 % IV SOLN: 1.0000 mg | Freq: Once | INTRAVENOUS | Status: DC

## 2020-03-02 MED ORDER — PROMETHAZINE HCL 25 MG/ML IJ SOLN
25.0000 mg | Freq: Once | INTRAVENOUS | Status: AC
  Administered 2020-03-02: 25 mg via INTRAVENOUS
  Filled 2020-03-02: qty 1

## 2020-03-02 MED ORDER — LACTATED RINGERS IV BOLUS: 2000.0000 mL | Freq: Once | INTRAVENOUS | Status: DC

## 2020-03-02 NOTE — ED Notes (Signed)
ED Provider at bedside. 

## 2020-03-02 NOTE — ED Provider Notes (Signed)
[facility] Emergency Department Provider Note  ____________________________________________   First MD Initiated Contact with Patient 03/02/20 0153     (approximate)  I have reviewed the triage vital signs and the nursing notes.   HISTORY  Chief Complaint Emesis    HPI Janet Rich is a 24 y.o. female  Here with n/v. Pt is currently [redacted] wk pregnant with twins. She states that she has had severe NV with her pregnancy and has been losing weight. She has seen her OB multiple times and was just in the ED for this. Has been taking zofran, phenergan, reglan, diclegis w/o relief. She has been unable to eat or drink for the past 48 hours. She has felt weak, lightheaded. No significant abd pain other than tenderness after vomiting. No fever, chills. No vaginal bleeding. No LOF.       Past Medical History:  Diagnosis Date  . Anemia    with periods  . Ankle fracture    right  . Anxiety   . Asthma   . Back pain   . Depression   . Endometriosis   . GERD (gastroesophageal reflux disease)   . Headache   . Ovarian cyst   . PCOS (polycystic ovarian syndrome)   . Pneumonia    age 12  . PONV (postoperative nausea and vomiting)    nausea after wisdom teeth extraction  . Vaginal Pap smear, abnormal    bx ok    Patient Active Problem List   Diagnosis Date Noted  . Right trigeminal neuralgia 11/08/2019  . Endometriosis 03/27/2019  . PCOS (polycystic ovarian syndrome) 03/27/2019  . Moderate persistent asthma 03/07/2019  . Post-nasal drainage 03/07/2019  . Irritable bowel syndrome with diarrhea 03/07/2019  . Gastroesophageal reflux disease without esophagitis 03/07/2019  . Class 2 obesity due to excess calories without serious comorbidity with body mass index (BMI) of 38.0 to 38.9 in adult 09/02/2018  . Bipolar 2 disorder (HCC) 08/12/2018  . History of low birth weight 03/11/2018  . Vulvar atrophy 03/11/2018  . Dysmenorrhea 05/01/2015  . History of  migraine headaches 05/01/2015  . Anxiety and depression 05/01/2015    Past Surgical History:  Procedure Laterality Date  . FRACTURE SURGERY     ankle surgery with impant   . HYSTEROSCOPY WITH D & C N/A 09/22/2019   Procedure: DILATATION AND CURETTAGE /HYSTEROSCOPY;  Surgeon: Ward, Elenora Fender, MD;  Location: [facility] ORS;  Service: Gynecology;  Laterality: N/A;  . ROBOTIC ASSISTED DIAGNOSTIC LAPAROSCOPY N/A 02/18/2017   Procedure: ROBOTIC ASSISTED DIAGNOSTIC LAPAROSCOPY,EXCISION AND ABLATION OF ENDOMETRIOSIS,LYSIS OF ADHESIONS;  Surgeon: Olivia Mackie, MD;  Location: [facility] ORS;  Service: Gynecology;  Laterality: N/A;  . WISDOM TOOTH EXTRACTION  2013    Prior to Admission medications   Medication Sig Start Date End Date Taking? Authorizing Provider  acetaminophen (TYLENOL) 500 MG tablet Take 500 mg by mouth every 6 (six) hours as needed for headache.    [provider]  albuterol (PROVENTIL HFA;VENTOLIN HFA) 108 (90 Base) MCG/ACT inhaler Inhale 1-2 puffs into the lungs every 6 (six) hours as needed for wheezing or shortness of breath. 11/19/18   Domenick Gong, MD  albuterol (PROVENTIL) (2.5 MG/3ML) 0.083% nebulizer solution Take 3 mLs (2.5 mg total) by nebulization every 6 (six) hours as needed for wheezing or shortness of breath. 01/30/19   Doren Custard, FNP  budesonide-formoterol (SYMBICORT) 80-4.5 MCG/ACT inhaler Inhale 2 puffs into the lungs 2 (two) times daily. 06/23/19   Doren Custard, FNP  doxylamine, Sleep, (UNISOM) 25 MG tablet Take 12.5 mg by mouth in the morning and at bedtime. Plus 1 tablet at bedtime.    [provider]  lamoTRIgine (LAMICTAL) 200 MG tablet Take 200 mg by mouth daily.     [provider]  metoCLOPramide (REGLAN) 10 MG tablet Take 1 tablet (10 mg total) by mouth every 6 (six) hours as needed for nausea. 02/19/20 02/18/21  Willy Eddy, MD  Prenatal Vit-Fe Fumarate-FA (PRENATAL MULTIVITAMIN) TABS tablet Take 2 tablets by mouth daily.    [provider]  promethazine (PHENERGAN) 12.5 MG tablet Take 12.5-25 mg by mouth every 4 (four) hours as needed.  01/11/20   [provider]  Pyridoxine HCl (VITAMIN B-6 PO) Take by mouth.    [provider]    Allergies Amoxicillin, Other, and Latex  Family History  Problem Relation Age of Onset  . Endometriosis Mother   . Bipolar disorder Mother   . Diabetes Mother        prediabetes  . Breast cancer Paternal Grandmother   . Cancer Paternal Grandmother   . Depression Paternal Grandmother   . Schizophrenia Father   . Depression Father   . Heart disease Neg Hx   . Colon cancer Neg Hx   . Ovarian cancer Neg Hx     Social History Social History   Tobacco Use  . Smoking status: Former Smoker    Types: Cigarettes    Quit date: 12/20/2009    Years since quitting: 10.2  . Smokeless tobacco: Never Used  . Tobacco comment: age [age], quit  Substance Use Topics  . Alcohol use: Not Currently  . Drug use: No    Review of Systems  Review of Systems  Constitutional: Positive for fatigue. Negative for fever.  HENT: Negative for congestion and sore throat.   Eyes: Negative for visual disturbance.  Respiratory: Negative for cough and shortness of breath.   Cardiovascular: Negative for chest pain.  Gastrointestinal: Positive for nausea and vomiting. Negative for abdominal pain and diarrhea.  Genitourinary: Negative for flank pain.  Musculoskeletal: Negative for back pain and neck pain.  Skin: Negative for rash and wound.  Neurological: Negative for weakness.  All other systems reviewed and are negative.    ____________________________________________  PHYSICAL EXAM:      VITAL SIGNS: ED Triage Vitals  Enc Vitals Group     BP 03/01/20 2048 124/77     Pulse Rate 03/01/20 2048 96     Resp 03/01/20 2048 18     Temp 03/01/20 2048 98.3 F (36.8 C)     Temp Source 03/01/20 2048 Oral     SpO2 03/01/20 2048 99 %     Weight 03/01/20 2049 203 lb (92.1 kg)      Height 03/01/20 2049 5\' 1"  (1.549 m)     Head Circumference --      Peak Flow --      Pain Score 03/01/20 2048 3     Pain Loc --      Pain Edu? --      Excl. in GC? --      Physical Exam Vitals and nursing note reviewed.  Constitutional:      General: She is not in acute distress.    Appearance: She is well-developed.  HENT:     Head: Normocephalic and atraumatic.     Mouth/Throat:     Mouth: Mucous membranes are dry.  Eyes:     Conjunctiva/sclera: Conjunctivae normal.  Cardiovascular:  Rate and Rhythm: Normal rate and regular rhythm.     Heart sounds: Normal heart sounds. No murmur. No friction rub.  Pulmonary:     Effort: Pulmonary effort is normal. No respiratory distress.     Breath sounds: Normal breath sounds. No wheezing or rales.  Abdominal:     General: There is no distension.     Palpations: Abdomen is soft.     Tenderness: There is no abdominal tenderness.  Musculoskeletal:     Cervical back: Neck supple.  Skin:    General: Skin is warm.     Capillary Refill: Capillary refill takes less than 2 seconds.  Neurological:     Mental Status: She is alert and oriented to person, place, and time.     Motor: No abnormal muscle tone.       ____________________________________________   LABS (all labs ordered are listed, but only abnormal results are displayed)  Labs Reviewed  COMPREHENSIVE METABOLIC PANEL - Abnormal; Notable for the following components:      Result Value   Sodium 133 (*)    Creatinine, Ser 0.39 (*)    AST 12 (*)    All other components within normal limits  LIPASE, BLOOD  CBC    ____________________________________________  EKG: None ________________________________________  RADIOLOGY All imaging, including plain films, CT scans, and ultrasounds, independently reviewed by me, and interpretations confirmed via formal radiology reads.  ED MD interpretation:   None  Official radiology report(s): No results  found.  ____________________________________________  PROCEDURES   Procedure(s) performed (including Critical Care):  Procedures  ____________________________________________  INITIAL IMPRESSION / MDM / ASSESSMENT AND PLAN / ED COURSE  As part of my medical decision making, I reviewed the following data within the electronic MEDICAL RECORD NUMBER Nursing notes reviewed and incorporated, Old chart reviewed, Notes from prior ED visits, and [place] Controlled Substance Database       *Janet Rich was evaluated in Emergency Department on 03/02/2020 for the symptoms described in the history of present illness. She was evaluated in the context of the global COVID-19 pandemic, which necessitated consideration that the patient might be at risk for infection with the SARS-CoV-2 virus that causes COVID-19. Institutional protocols and algorithms that pertain to the evaluation of patients at risk for COVID-19 are in a state of rapid change based on information released by regulatory bodies including the CDC and federal and state organizations. These policies and algorithms were followed during the patient's care in the ED.  Some ED evaluations and interventions may be delayed as a result of limited staffing during the pandemic.*     Medical Decision Making:  24 yo G3P2011 at estimated [redacted]w[redacted]d here with nausea, vomiting likely 2/2 hyperemesis gravidarum. Labs show mild hyponatremia likely 2/2 dehydration but normal renal function, bicarb. WBC normal. Lipase wnl. No urinary symptoms. Feels much better after 2L NS, antiemetics and is tolerating PO. Abdomen soft, no signs of itnra-abd pathology. No VB, LOF. Will d/c with continued supportive care at home and close OB follow-up.  ____________________________________________  FINAL CLINICAL IMPRESSION(S) / ED DIAGNOSES  Final diagnoses:  Hyperemesis gravidarum  Dehydration     MEDICATIONS GIVEN DURING THIS VISIT:  Medications  promethazine (PHENERGAN) 25  mg in dextrose 5% lactated ringers 1,000 mL infusion (0 mg Intravenous Stopped 03/02/20 0516)  lactated ringers bolus 1,000 mL (0 mLs Intravenous Stopped 03/02/20 0515)  thiamine (B-1) injection 100 mg (100 mg Intravenous Given 03/02/20 0520)  prochlorperazine (COMPAZINE) injection 10 mg (10 mg Intravenous Given 03/02/20  5697)  diphenhydrAMINE (BENADRYL) injection 25 mg (25 mg Intravenous Given 03/02/20 0520)  lactated ringers bolus 500 mL (0 mLs Intravenous Stopped 9/48/01 6553)  folic acid injection 1 mg (1 mg Intravenous Given 03/02/20 7482)     ED Discharge Orders    None       Note:  This document was prepared using Dragon voice recognition software and may include unintentional dictation errors.   Duffy Bruce, MD 03/02/20 1345

## 2020-03-02 NOTE — ED Notes (Signed)
Patient given crackers and water at this time.  

## 2020-03-02 NOTE — ED Notes (Signed)
Patient resting quietly with eyes closed in no acute distress.  

## 2020-03-07 DIAGNOSIS — R112 Nausea with vomiting, unspecified: Secondary | ICD-10-CM | POA: Diagnosis not present

## 2020-03-11 ENCOUNTER — Ambulatory Visit: Payer: Medicaid Other | Admitting: Licensed Clinical Social Worker

## 2020-03-11 DIAGNOSIS — M9901 Segmental and somatic dysfunction of cervical region: Secondary | ICD-10-CM | POA: Diagnosis not present

## 2020-03-11 DIAGNOSIS — F3181 Bipolar II disorder: Secondary | ICD-10-CM

## 2020-03-11 DIAGNOSIS — F411 Generalized anxiety disorder: Secondary | ICD-10-CM

## 2020-03-11 DIAGNOSIS — R42 Dizziness and giddiness: Secondary | ICD-10-CM | POA: Diagnosis not present

## 2020-03-11 DIAGNOSIS — M530 Cervicocranial syndrome: Secondary | ICD-10-CM | POA: Diagnosis not present

## 2020-03-11 DIAGNOSIS — M9900 Segmental and somatic dysfunction of head region: Secondary | ICD-10-CM | POA: Diagnosis not present

## 2020-03-11 NOTE — Progress Notes (Signed)
Counselor/Therapist Progress Note  [name], MRN: [id number],    Date: 03/11/2020  Time Spent: 45 minutes   Treatment Type: Individual Therapy  Reported Symptoms: high anxiety, anxious, nervous, irritable; sleeping well; low energy  Mental Status Exam:  Appearance:   Casual     Behavior:  Appropriate and Sharing  Motor:  Normal  Speech/Language:   Normal Rate  Affect:  Appropriate  Mood:  normal  Thought process:  normal  Thought content:    WNL  Sensory/Perceptual disturbances:    WNL  Orientation:  oriented to person, place, time/date, situation and day of week  Attention:  Good  Concentration:  Good  Memory:  WNL  Fund of knowledge:   Good  Insight:    Good  Judgment:   Good  Impulse Control:  Good   Risk Assessment: Danger to Self:  No Self-injurious Behavior: No Danger to Others: No Duty to Warn:no Physical Aggression / Violence:No  Access to Firearms a concern: No  Gang Involvement:No   Subjective: Patient was engaged and cooperative throughout the session using time effectively to discuss thoughts and feelings. Patient voices continued motivation for treatment and understanding of bipolar disorder and anxiety. Patient is likely to benefit from future treatment because she remains motivated to decrease anxiety and manage mood symptoms and reports benefit of regular sessions in addressing these symptoms.   Interventions: Cognitive Behavioral Therapy Established psychological safety.  Checked in with patient and reviewed previous session, including assessment and goal of treatment. Reviewed CBTs. Explored patient's goal of treatment and worked collaboratively to develop CBT treatment plan. Encouraged patient to notice self-talk; also encouraged patient to begin TP online.  Provided support through active listening, validation of feelings, and highlighted patient's strengths.  Diagnosis:   ICD-10-CM   1. Mild bipolar II disorder, most recent episode  major depressive ([place])  F31.81   2. Generalized anxiety disorder  F41.1     Plan: Patient's goal is: Help with anxiety and working through things. She reports benefit from having someone to listen to her.  Treatment Target: Understand the relationship between thoughts, emotions, and behaviors  - Psychoeducation on CBT model   - Teach the connection between thoughts, emotions, and behaviors   Treatment Target: Increase realistic balanced thinking  - Explore patient's thoughts, beliefs, automatic thoughts, assumptions  - Identify unhelpful thinking patterns  - Process distress and allow for emotional release  - Questioning and challenging thoughts - Cognitive reappraisal   Treatment Target: Increase coping skills - Mindfulness practices  - Deep breathing  - Teach distress tolerance techniques - "what helps me"  - STOP technique  Treatment Target 1: Increase knowledge of child development and positive parenting practices and increase parenting confidence  Objective 1: Through use of Triple P level 4 and/or Stepping Stones, increase parenting confidence and build awareness of child development, and increase parenting skills - Provide psychoeducation on Triple P positive parenting  - Explore current parenting strategies  - Parenting coaching for developing positive relationship with patient, encouraging desirable behavior, developing clear boundaries and behavioral expectations, providing effective commands and managing misbehavior.  - Provide opportunity to explore interventions from Triple P that can support patient in effective parenting practices - Teach behavior management techniques from Triple P to reduce negative behaviors, including consistency, age-appropriate expectations, prompting positive behaviors, using praise and rewards, using clear instructions, and natural consequences to help with listening. - Explore barriers to effective parenting and problem solve  Future  Appointments  Date Time Provider  Department Center  03/27/2020 11:00 AM Kathreen Cosier, LCSW [facility] None    Interpreter used: na   Kathreen Cosier, LCSW

## 2020-03-21 DIAGNOSIS — R55 Syncope and collapse: Secondary | ICD-10-CM | POA: Diagnosis not present

## 2020-03-21 DIAGNOSIS — R7309 Other abnormal glucose: Secondary | ICD-10-CM | POA: Diagnosis not present

## 2020-03-21 DIAGNOSIS — O09892 Supervision of other high risk pregnancies, second trimester: Secondary | ICD-10-CM | POA: Diagnosis not present

## 2020-03-25 DIAGNOSIS — R7309 Other abnormal glucose: Secondary | ICD-10-CM | POA: Diagnosis not present

## 2020-03-27 ENCOUNTER — Ambulatory Visit: Payer: Medicaid Other | Admitting: Licensed Clinical Social Worker

## 2020-03-27 DIAGNOSIS — F411 Generalized anxiety disorder: Secondary | ICD-10-CM

## 2020-03-27 DIAGNOSIS — F3181 Bipolar II disorder: Secondary | ICD-10-CM

## 2020-03-27 NOTE — Progress Notes (Signed)
Counselor/Therapist Progress Note  [name], MRN: [id number],    Date: 03/27/2020  Time Spent: 45 minutes   Treatment Type: Individual Therapy  Reported Symptoms: depressed mood, low energy, anxiety, worries, feeling overwhelmed   Mental Status Exam:  Appearance:   Casual     Behavior:  Appropriate and Sharing  Motor:  Normal  Speech/Language:   Normal Rate  Affect:  Appropriate and Congruent  Mood:  normal  Thought process:  normal  Thought content:    WNL  Sensory/Perceptual disturbances:    WNL  Orientation:  oriented to person, place, time/date and situation  Attention:  Good  Concentration:  Good  Memory:  WNL  Fund of knowledge:   Good  Insight:    Good  Judgment:   Good  Impulse Control:  Good   Risk Assessment: Danger to Self:  No Self-injurious Behavior: No Danger to Others: No Duty to Warn:no Physical Aggression / Violence:No  Access to Firearms a concern: No  Gang Involvement:No   Subjective: Patient was engaged and cooperative throughout the session using time effectively to discuss thoughts and feelings. Patient voices continued motivation for treatment and understanding of mood and anxiety issues. Patient is likely to benefit from future treatment because she remains motivated to decrease mood and anxiety issues.   Interventions: Cognitive Behavioral Therapy Established psychological safety. Checked in with patient, increased anxiety and depressive symptoms due to multiple stressors. Engaged patient in processing current psychosocial stressors, validating patient's feelings of overwhelm. Assisted patient in prioritizing most pressing stressors. Provided resources for diapers and food pantry. Provided psychoeducation on mindfulness, led patient in mindfulness exercise, processed exercise, and contracted with patient to use daily. Discussed plan for patient to remember to take medication daily. Provided support through active listening, validation  of feelings, and highlighted patient's strengths.   Diagnosis:   ICD-10-CM   1. Mild bipolar II disorder, most recent episode major depressive ([place])  F31.81   2. Generalized anxiety disorder  F41.1     Plan: Check in on use of mindfulness exercises.  Patient's goal TT:SVXB with anxiety and working through things.She reports benefit from having someone to listen to her.  Treatment Target: Understand the relationship between thoughts, emotions, and behaviors   Psychoeducation on CBT model    Teach the connection between thoughts, emotions, and behaviors   Treatment Target: Increase realistic balanced thinking   Explore patient's thoughts, beliefs, automatic thoughts, assumptions   Identify unhelpful thinking patterns   Process distress and allow for emotional release   Questioning and challenging thoughts  Cognitive reappraisal   Treatment Target: Increase coping skills  Mindfulness practices   Deep breathing   Teach distress tolerance techniques - "what helps me"   STOP technique  Treatment Target 1: Increase knowledge of child development and positive parenting practices and increase parenting confidence  Objective 1: Through use of Triple P level 4 and/or Stepping Stones, increase parenting confidence and build awareness of child development, and increase parenting skills  Provide psychoeducation on Triple P positive parenting   Explore current parenting strategies   Parenting coaching for developing positive relationship with patient, encouraging desirable behavior, developing clear boundaries and behavioral expectations, providing effective commands and managing misbehavior.   Provide opportunity to explore interventions from Triple P that can support patient in effective parenting practices  Teach behavior management techniques from Triple P to reduce negative behaviors, including consistency, age-appropriate expectations, prompting positive behaviors,  using praise and rewards, using clear instructions, and natural consequences to  help with listening.  Explore barriers to effective parenting and problem solve  Future Appointments  Date Time Provider Department Center  04/02/2020 10:00 AM Kathreen Cosier, LCSW [facility] None    Interpreter used: NA   Kathreen Cosier, LCSW

## 2020-04-01 ENCOUNTER — Other Ambulatory Visit: Payer: Self-pay

## 2020-04-01 ENCOUNTER — Ambulatory Visit
Admission: RE | Admit: 2020-04-01 | Discharge: 2020-04-01 | Disposition: A | Discharge status: Home / Self Care | Payer: BC Managed Care – PPO | Source: Ambulatory Visit | Attending: Obstetrics and Gynecology | Admitting: Obstetrics and Gynecology

## 2020-04-01 ENCOUNTER — Other Ambulatory Visit: Payer: Self-pay | Admitting: Obstetrics and Gynecology

## 2020-04-01 DIAGNOSIS — Z3686 Encounter for antenatal screening for cervical length: Secondary | ICD-10-CM | POA: Diagnosis not present

## 2020-04-01 DIAGNOSIS — O26899 Other specified pregnancy related conditions, unspecified trimester: Secondary | ICD-10-CM | POA: Diagnosis not present

## 2020-04-01 DIAGNOSIS — R102 Pelvic and perineal pain: Secondary | ICD-10-CM

## 2020-04-01 DIAGNOSIS — O26892 Other specified pregnancy related conditions, second trimester: Secondary | ICD-10-CM | POA: Diagnosis not present

## 2020-04-01 DIAGNOSIS — Z3A17 17 weeks gestation of pregnancy: Secondary | ICD-10-CM | POA: Diagnosis not present

## 2020-04-01 DIAGNOSIS — Z113 Encounter for screening for infections with a predominantly sexual mode of transmission: Secondary | ICD-10-CM | POA: Diagnosis not present

## 2020-04-01 DIAGNOSIS — O09892 Supervision of other high risk pregnancies, second trimester: Secondary | ICD-10-CM | POA: Diagnosis not present

## 2020-04-01 LAB — OB RESULTS CONSOLE GC/CHLAMYDIA
Chlamydia: NEGATIVE
Gonorrhea: NEGATIVE

## 2020-04-02 ENCOUNTER — Ambulatory Visit: Payer: Medicaid Other | Admitting: Licensed Clinical Social Worker

## 2020-04-02 DIAGNOSIS — F3181 Bipolar II disorder: Secondary | ICD-10-CM

## 2020-04-02 DIAGNOSIS — M9900 Segmental and somatic dysfunction of head region: Secondary | ICD-10-CM | POA: Diagnosis not present

## 2020-04-02 DIAGNOSIS — M530 Cervicocranial syndrome: Secondary | ICD-10-CM | POA: Diagnosis not present

## 2020-04-02 DIAGNOSIS — R42 Dizziness and giddiness: Secondary | ICD-10-CM | POA: Diagnosis not present

## 2020-04-02 DIAGNOSIS — M9901 Segmental and somatic dysfunction of cervical region: Secondary | ICD-10-CM | POA: Diagnosis not present

## 2020-04-02 NOTE — Progress Notes (Signed)
Counselor/Therapist Progress Note  [name], MRN: [id number],    Date: 04/02/2020  Time Spent: 45 minuteas  Treatment Type: Individual Therapy  Reported Symptoms: Obsessive thinking and anxiety, anxious thoughts, depressed mood/low mood, physical discomfort   Mental Status Exam:   Appearance:   Casual     Behavior:  Appropriate and Sharing  Motor:  Normal  Speech/Language:   Normal Rate  Affect:  Appropriate and Congruent  Mood:  normal  Thought process:  normal  Thought content:    WNL  Sensory/Perceptual disturbances:    WNL  Orientation:  oriented to person, place, time/date, situation and day of week  Attention:  Good  Concentration:  Good  Memory:  WNL  Fund of knowledge:   Good  Insight:    Good  Judgment:   Good  Impulse Control:  Good   Risk Assessment: Danger to Self:  No Self-injurious Behavior: No Danger to Others: No Duty to Warn:no Physical Aggression / Violence:No  Access to Firearms a concern: No  Gang Involvement:No   Subjective: Patient was engaged and cooperative throughout the session using time effectively to discuss thoughts, feelings, and coping strategies. Patient voices continued motivation for treatment and understanding of mood and anxiety issues. Patient is likely to benefit from future treatment because she remains motivated to decrease anxietand depressed mood and reports benefit of regular sessions in addressing these symptoms. Patient also reports benefit from medication management.   Interventions: Cognitive Behavioral Therapy  Established psychological safety. Checked in with patient and engaged her in processing current psychosocial stressors, continued depressive/anxiety symptoms due to stressors with current living environment. Validated patient's feelings of frustration and hopelessness, identifying origins of these feelings within this housing situation. Taught patient about radical acceptance. Encouraged patient to  engage in self-care activities. Provided support through active listening, validation of feelings, and highlighted patient's strengths.    Diagnosis:   ICD-10-CM   1. Mild bipolar II disorder, most recent episode major depressive ([facility])  F31.81    Plan: Check in on use of mindfulness exercises.  Patient's goal YB:OFBP with anxiety and working through things.She reports benefit from having someone to listen to her.  Treatment Target: Understand the relationship between thoughts, emotions, and behaviors   Psychoeducation on CBT model   Teach the connection between thoughts, emotions, and behaviors   Treatment Target: Increase realistic balanced thinking   Explore patient's thoughts, beliefs, automatic thoughts, assumptions   Identify unhelpful thinking patterns   Process distress and allow for emotional release   Questioning and challenging thoughts  Cognitive reappraisal   Treatment Target: Increase coping skills  Mindfulness practices   Deep breathing   Teach distress tolerance techniques -"what helps me"  STOP technique  Treatment Target 1: Increase knowledge of child development and positive parenting practices and increase parenting confidence  Objective 1: Through use of Triple P level 4 and/or Stepping Stones, increase parenting confidence and build awareness of child development, and increase parenting skills  Provide psychoeducation on Triple P positive parenting   Explore current parenting strategies   Parenting coaching for developing positive relationship with patient, encouraging desirable behavior, developing clear boundaries and behavioral expectations, providing effective commands and managing misbehavior.   Provide opportunity to explore interventions from Triple P that can support patient in effective parenting practices  Teach behavior management techniques from Triple P to reduce negative behaviors, including consistency, age-appropriate  expectations, prompting positive behaviors, using praise and rewards, using clear instructions, and natural consequences to help with listening.  Explore barriers to effective parenting and problem solve  Future Appointments  Date Time Provider [place]  04/09/2020 10:00 AM Milton Ferguson, LCSW [facility] None    Interpreter used: NA   Milton Ferguson, LCSW

## 2020-04-09 ENCOUNTER — Ambulatory Visit: Payer: Medicaid Other | Admitting: Licensed Clinical Social Worker

## 2020-04-09 DIAGNOSIS — F3181 Bipolar II disorder: Secondary | ICD-10-CM

## 2020-04-09 DIAGNOSIS — F411 Generalized anxiety disorder: Secondary | ICD-10-CM

## 2020-04-09 NOTE — Progress Notes (Signed)
Counselor/Therapist Progress Note  [name], MRN: [id number],    Date: 04/09/2020  Time Spent: 30 minutes - per patient requested to end session  Treatment Type: Individual Therapy  Reported Symptoms: Feelings of Worthlessness, Hopelessness, Obsessive thinking, Anhedonia, Sleep disturbance, Fatigue and depressed mood, passive suicidal ideation  Mental Status Exam:  Appearance:   Casual     Behavior:  Appropriate and Sharing  Motor:  Normal  Speech/Language:   Normal Rate  Affect:  Appropriate and Congruent  Mood:  dysthymic  Thought process:  normal  Thought content:    WNL  Sensory/Perceptual disturbances:    WNL  Orientation:  oriented to person, place, time/date, situation and day of week  Attention:  Good  Concentration:  Good  Memory:  WNL  Fund of knowledge:   Good  Insight:    Good  Judgment:   Good  Impulse Control:  Good   Risk Assessment: Danger to Self:  No Self-injurious Behavior: No Danger to Others: No Duty to Warn:no Physical Aggression / Violence:No  Access to Firearms a concern: No  Gang Involvement:No   Subjective: Patient was engaged and cooperative throughout the session using time effectively to discuss thoughts, feelings, and coping strategies to manage increased mood instability and anxiety. Patient  voices continued motivation for treatment and downloaded the Clear fear app. and agreed to use and also agreed to discuss symptoms with OBGYN. Although patient did voice experiencing passive suicidal ideation yesterday, she reported these thoughts lasted about 30 minutes - she used support system and symptoms decreased, she denied current SI. Patient is likely to benefit from future treatment because she remains motivated to decrease symptoms and reports benefit of regular sessions in addressing symptoms. Patient is recommended to be evaluated regarding current medication management regimen.   Interventions: Cognitive Behavioral Therapy   Established psychological safety. Checked in with patient and engaged her in processing current psychosocial stressors, increased mood instability and anxiety. Assessed patient for safety - patient denies active suicidal ideation, intent, plans, or means. Discussed medication management and potential options to be seen by a prescriber, including current psychiatrist, OBGYN at [facility], [facility], and/or [facility]. Reviewed the availability of the crisis line and also reviewed availability of LCSW.Explored patient's perception of current stressors highlighting unhelpful thoughts vs feelings and reviewed the connection between thoughts, emotions, and behaviors. Discussed radical acceptance, self-statements, and use of Calm Fear app. Patient agreed to use app when session ended.  Provided support through active listening, validation of feelings, and highlighted patient's strengths.   Diagnosis:   ICD-10-CM   1. Mild bipolar II disorder, most recent episode major depressive ([place])  F31.81   2. Generalized anxiety disorder  F41.1     Plan: Check in on use of clear fear, and assess for SI.  Patient's goal YI:RSWN with anxiety and working through things.She reports benefit from having someone to listen to her.  Treatment Target: Understand the relationship between thoughts, emotions, and behaviors   Psychoeducation on CBT model   Teach the connection between thoughts, emotions, and behaviors   Treatment Target: Increase realistic balanced thinking   Explore patient's thoughts, beliefs, automatic thoughts, assumptions   Identify unhelpful thinking patterns   Process distress and allow for emotional release   Questioning and challenging thoughts  Cognitive reappraisal   Treatment Target: Increase coping skills  Mindfulness practices   Deep breathing   Teach distress tolerance techniques -"what helps me"  STOP technique  Treatment Target 1: Increase  knowledge of child development and positive parenting practices and increase parenting confidence  Objective 1: Through use of Triple P level 4 and/or Stepping Stones, increase parenting confidence and build awareness of child development, and increase parenting skills  Provide psychoeducation on Triple P positive parenting   Explore current parenting strategies   Parenting coaching for developing positive relationship with patient, encouraging desirable behavior, developing clear boundaries and behavioral expectations, providing effective commands and managing misbehavior.   Provide opportunity to explore interventions from Triple P that can support patient in effective parenting practices  Teach behavior management techniques from Triple P to reduce negative behaviors, including consistency, age-appropriate expectations, prompting positive behaviors, using praise and rewards, using clear instructions, and natural consequences to help with listening.  Explore barriers to effective parenting and problem solve  Future Appointments  Date Time Provider Department Center  04/15/2020  2:00 PM Kathreen Cosier, LCSW [facility] None   Interpreter used: NA  Kathreen Cosier, LCSW

## 2020-04-11 DIAGNOSIS — O30042 Twin pregnancy, dichorionic/diamniotic, second trimester: Secondary | ICD-10-CM | POA: Diagnosis not present

## 2020-04-11 DIAGNOSIS — R102 Pelvic and perineal pain: Secondary | ICD-10-CM | POA: Diagnosis not present

## 2020-04-11 DIAGNOSIS — R42 Dizziness and giddiness: Secondary | ICD-10-CM | POA: Diagnosis not present

## 2020-04-11 DIAGNOSIS — R519 Headache, unspecified: Secondary | ICD-10-CM | POA: Diagnosis not present

## 2020-04-11 DIAGNOSIS — O09892 Supervision of other high risk pregnancies, second trimester: Secondary | ICD-10-CM | POA: Diagnosis not present

## 2020-04-15 ENCOUNTER — Ambulatory Visit: Payer: Medicaid Other | Admitting: Licensed Clinical Social Worker

## 2020-04-15 DIAGNOSIS — M530 Cervicocranial syndrome: Secondary | ICD-10-CM | POA: Diagnosis not present

## 2020-04-15 DIAGNOSIS — M9901 Segmental and somatic dysfunction of cervical region: Secondary | ICD-10-CM | POA: Diagnosis not present

## 2020-04-15 DIAGNOSIS — M9900 Segmental and somatic dysfunction of head region: Secondary | ICD-10-CM | POA: Diagnosis not present

## 2020-04-15 DIAGNOSIS — F3181 Bipolar II disorder: Secondary | ICD-10-CM

## 2020-04-15 DIAGNOSIS — R42 Dizziness and giddiness: Secondary | ICD-10-CM | POA: Diagnosis not present

## 2020-04-15 NOTE — Progress Notes (Signed)
Counselor/Therapist Progress Note  [name], MRN: [id number],    Date: 04/15/2020  Time Spent: 45 minutes  Treatment Type: Individual Therapy  Reported Symptoms: mood improvement, decrease in distress and decrease in anxiety, anxious thoughts   Mental Status Exam:  Appearance:   Casual     Behavior:  Appropriate and Sharing  Motor:  Normal  Speech/Language:   Normal Rate  Affect:  Appropriate and Congruent  Mood:  normal  Thought process:  normal  Thought content:    WNL  Sensory/Perceptual disturbances:    WNL  Orientation:  oriented to person, place, time/date, situation and day of week  Attention:  Good  Concentration:  Good  Memory:  WNL  Fund of knowledge:   Good  Insight:    Good  Judgment:   Good  Impulse Control:  Good   Risk Assessment: Danger to Self:  No Self-injurious Behavior: No Danger to Others: No Duty to Warn:no Physical Aggression / Violence:No  Access to Firearms a concern: No  Gang Involvement:No   Subjective: Patient was engaged and cooperative throughout the session using time effectively to discuss thoughts and feelings. Patient voices continued motivation for treatment and understanding of mood issues. Patient is likely to benefit from future treatment because she remains motivated to decrease mood instability and reports benefit of regular sessions in addressing these symptoms.    Interventions: Cognitive Behavioral Therapy Established psychological safety. Checked in with patient and engaged patient in processing current psychosocial stressors, decrease in distress due to imporved environmental circumstances. The patient used session to verbally vent and express successes and concerns. Reviewed the use of deep breathing and use of the App. Clear Fear.  Provided support through active listening, validation of feelings, and highlighted patient's strengths.   Diagnosis:   ICD-10-CM   1. Mild bipolar II disorder, most recent episode  major depressive ([place])  F31.81     Plan: Patient's goal IZ:TIWP with anxiety and working through things.She reports benefit from having someone to listen to her.  Treatment Target: Understand the relationship between thoughts, emotions, and behaviors   Psychoeducation on CBT model   Teach the connection between thoughts, emotions, and behaviors   Treatment Target: Increase realistic balanced thinking   Explore patient's thoughts, beliefs, automatic thoughts, assumptions   Identify unhelpful thinking patterns   Process distress and allow for emotional release   Questioning and challenging thoughts  Cognitive reappraisal   Treatment Target: Increase coping skills  Mindfulness practices   Deep breathing   Teach distress tolerance techniques -"what helps me"  STOP technique  Treatment Target 1: Increase knowledge of child development and positive parenting practices and increase parenting confidence  Objective 1: Through use of Triple P level 4 and/or Stepping Stones, increase parenting confidence and build awareness of child development, and increase parenting skills  Provide psychoeducation on Triple P positive parenting   Explore current parenting strategies   Parenting coaching for developing positive relationship with patient, encouraging desirable behavior, developing clear boundaries and behavioral expectations, providing effective commands and managing misbehavior.   Provide opportunity to explore interventions from Triple P that can support patient in effective parenting practices  Teach behavior management techniques from Triple P to reduce negative behaviors, including consistency, age-appropriate expectations, prompting positive behaviors, using praise and rewards, using clear instructions, and natural consequences to help with listening.  Explore barriers to effective parenting and problem solve  Future Appointments  Date Time Provider [place]  04/24/2020 11:00 AM Milton Ferguson, LCSW  [facility] None   Interpreter used: NA  Janet Cosier, LCSW

## 2020-04-24 ENCOUNTER — Ambulatory Visit: Payer: Medicaid Other | Admitting: Licensed Clinical Social Worker

## 2020-04-24 DIAGNOSIS — F411 Generalized anxiety disorder: Secondary | ICD-10-CM

## 2020-04-24 DIAGNOSIS — F3181 Bipolar II disorder: Secondary | ICD-10-CM

## 2020-04-24 NOTE — Progress Notes (Signed)
Counselor/Therapist Progress Note  [name], MRN: [id number],    Date: 04/24/2020  Time Spent: 47 minutes   Treatment Type: Individual Therapy  Reported Symptoms: over all stable mood; anxiety, anxious thoughts, irritability  Mental Status Exam:  Appearance:   Casual     Behavior:  Appropriate and Sharing  Motor:  Normal  Speech/Language:   Normal Rate  Affect:  Appropriate and Congruent  Mood:  normal  Thought process:  normal  Thought content:    WNL  Sensory/Perceptual disturbances:    WNL  Orientation:  oriented to person, place, time/date, situation and day of week  Attention:  Good  Concentration:  Good  Memory:  WNL  Fund of knowledge:   Good  Insight:    Good  Judgment:   Good  Impulse Control:  Good   Risk Assessment: Danger to Self:  No Self-injurious Behavior: No Danger to Others: No Duty to Warn:no Physical Aggression / Violence:No  Access to Firearms a concern: No  Gang Involvement:No   Subjective: Patient was engaged and cooperative throughout the session using time effectively to discuss thoughts and feelings. Patient voices continued motivation for treatment and understanding of mood and anxiety issues. Patient is likely to benefit from future treatment because she remains motivated to decrease symptoms and improve functioning and reports benefit of the combination of regular sessions and medication management in addressing these symptoms.    Interventions: Cognitive Behavioral Therapy  Established psychological safety. Actively listened to patient while she expressed her concerns and frustrations within her relationship with her husband. Explored patient's experience of relationship conflict identifying points of intervention.  Highlighted unhelpful thoughts, and reframed these thoughts leading to irritability and frustration. Discussed assertiveness communication tips - I messages, setting limits on length of conversation, taking timed  breaks if needed, staying focused on the topic. Provided support through active listening, validation of feelings, and highlighted patient's strengths.   Diagnosis:   ICD-10-CM   1. Mild bipolar II disorder, most recent episode major depressive (HCC)  F31.81   2. Generalized anxiety disorder  F41.1     Plan: Patient's goal BJ:SEGB with anxiety and working through things.She reports benefit from having someone to listen to her.  Treatment Target: Understand the relationship between thoughts, emotions, and behaviors   Psychoeducation on CBT model   Teach the connection between thoughts, emotions, and behaviors   Treatment Target: Increase realistic balanced thinking   Explore patient's thoughts, beliefs, automatic thoughts, assumptions   Identify unhelpful thinking patterns   Process distress and allow for emotional release   Questioning and challenging thoughts  Cognitive reappraisal   Treatment Target: Increase coping skills  Mindfulness practices   Deep breathing   Teach distress tolerance techniques -"what helps me"  STOP technique  Treatment Target: Improve relationship satisfaction with spouse   Communication techniques  Boundary setting   Relationship values exploration  Treatment Target 1: Increase knowledge of child development and positive parenting practices and increase parenting confidence  Objective 1: Through use of Triple P level 4 and/or Stepping Stones, increase parenting confidence and build awareness of child development, and increase parenting skills  Provide psychoeducation on Triple P positive parenting   Explore current parenting strategies   Parenting coaching for developing positive relationship with patient, encouraging desirable behavior, developing clear boundaries and behavioral expectations, providing effective commands and managing misbehavior.   Provide opportunity to explore interventions from Triple P that can support  patient in effective parenting practices  Teach behavior management techniques  from Triple P to reduce negative behaviors, including consistency, age-appropriate expectations, prompting positive behaviors, using praise and rewards, using clear instructions, and natural consequences to help with listening.  Explore barriers to effective parenting and problem solve  Future Appointments  Date Time Provider Department Center  05/01/2020  2:30 PM Kathreen Cosier, LCSW [facility] None    Interpreter used: NA  Kathreen Cosier, LCSW

## 2020-05-01 ENCOUNTER — Ambulatory Visit: Payer: Medicaid Other | Admitting: Licensed Clinical Social Worker

## 2020-05-01 DIAGNOSIS — F411 Generalized anxiety disorder: Secondary | ICD-10-CM

## 2020-05-01 DIAGNOSIS — F3181 Bipolar II disorder: Secondary | ICD-10-CM

## 2020-05-01 DIAGNOSIS — O09892 Supervision of other high risk pregnancies, second trimester: Secondary | ICD-10-CM | POA: Diagnosis not present

## 2020-05-01 DIAGNOSIS — O368120 Decreased fetal movements, second trimester, not applicable or unspecified: Secondary | ICD-10-CM | POA: Diagnosis not present

## 2020-05-01 NOTE — Progress Notes (Signed)
Counselor/Therapist Progress Note  [name], MRN: [id number],    Date: 05/01/2020  Time Spent: 20 minutes    Treatment Type: Individual Therapy  Reported Symptoms: continued anxiety, anxious thoughts; mood stability  Mental Status Exam:  Appearance:   Casual     Behavior:  Appropriate and Sharing  Motor:  Normal  Speech/Language:   Normal Rate  Affect:  Appropriate and Congruent  Mood:  normal  Thought process:  normal  Thought content:    WNL  Sensory/Perceptual disturbances:    WNL  Orientation:  oriented to person, place, time/date, situation and day of week  Attention:  Good  Concentration:  Good  Memory:  WNL  Fund of knowledge:   Good  Insight:    Good  Judgment:   Good  Impulse Control:  Good   Risk Assessment: Danger to Self:  No Self-injurious Behavior: No Danger to Others: No Duty to Warn:no Physical Aggression / Violence:No  Access to Firearms a concern: No  Gang Involvement:No   Subjective: Patient was engaged and cooperative throughout the session using time effectively to discuss thoughts and feelings.  Patient voices continued motivation for treatment and understanding of mood and anxiety issues. Patient is likely to benefit from future treatment because she remains motivated to decrease mood and anxiety symptoms and reports benefit of regular sessions in combination with medication management in addressing these symptoms.   Interventions: Cognitive Behavioral Therapy  Established psychological safety. Checked in with patient regarding her week. Discussed new medication regimen - Buspar due to anxiety and having an after delivery care plan. Reviewed previous session regarding communication tips. Encouraged patient to use HALT - checking in with herself when she is emotionally dysregulated. Provided support through active listening, validation of feelings, and highlighted patient's strengths.   Diagnosis:   ICD-10-CM   1. Mild bipolar II  disorder, most recent episode major depressive (HCC)  F31.81   2. Generalized anxiety disorder  F41.1    Plan: Patient's goal XF:GHWE with anxiety and working through things.She reports benefit from having someone to listen to her.  Treatment Target: Understand the relationship between thoughts, emotions, and behaviors   Psychoeducation on CBT model   Teach the connection between thoughts, emotions, and behaviors   Treatment Target: Increase realistic balanced thinking   Explore patient's thoughts, beliefs, automatic thoughts, assumptions   Identify unhelpful thinking patterns   Process distress and allow for emotional release   Questioning and challenging thoughts  Cognitive reappraisal   Treatment Target: Increase coping skills  Mindfulness practices   Deep breathing   Teach distress tolerance techniques -"what helps me"  STOP technique  Treatment Target: Improve relationship satisfaction with spouse   Communication techniques  Boundary setting   Relationship values exploration  Treatment Target 1: Increase knowledge of child development and positive parenting practices and increase parenting confidence  Objective 1: Through use of Triple P level 4 and/or Stepping Stones, increase parenting confidence and build awareness of child development, and increase parenting skills  Provide psychoeducation on Triple P positive parenting   Explore current parenting strategies   Parenting coaching for developing positive relationship with patient, encouraging desirable behavior, developing clear boundaries and behavioral expectations, providing effective commands and managing misbehavior.   Provide opportunity to explore interventions from Triple P that can support patient in effective parenting practices  Teach behavior management techniques from Triple P to reduce negative behaviors, including consistency, age-appropriate expectations, prompting positive  behaviors, using praise and rewards, using clear instructions, and  natural consequences to help with listening.  Explore barriers to effective parenting and problem solve  Future Appointments  Date Time Provider Department Center  05/08/2020  5:30 PM Kathreen Cosier, LCSW [facility] None   Interpreter used:   Kathreen Cosier, LCSW

## 2020-05-07 DIAGNOSIS — M530 Cervicocranial syndrome: Secondary | ICD-10-CM | POA: Diagnosis not present

## 2020-05-07 DIAGNOSIS — M9900 Segmental and somatic dysfunction of head region: Secondary | ICD-10-CM | POA: Diagnosis not present

## 2020-05-07 DIAGNOSIS — R42 Dizziness and giddiness: Secondary | ICD-10-CM | POA: Diagnosis not present

## 2020-05-07 DIAGNOSIS — M9901 Segmental and somatic dysfunction of cervical region: Secondary | ICD-10-CM | POA: Diagnosis not present

## 2020-05-08 ENCOUNTER — Ambulatory Visit: Payer: Medicaid Other | Admitting: Licensed Clinical Social Worker

## 2020-05-08 DIAGNOSIS — F411 Generalized anxiety disorder: Secondary | ICD-10-CM

## 2020-05-08 DIAGNOSIS — F3181 Bipolar II disorder: Secondary | ICD-10-CM

## 2020-05-08 NOTE — Progress Notes (Signed)
Counselor/Therapist Progress Note  Patient ID: Alexsandra Shontz, MRN: [id number],    Date: 05/08/2020  Time Spent: 40 minutes   Treatment Type: Individual Therapy  Reported Symptoms: distress, anxiety, anxious thoughts  Mental Status Exam:  Appearance:   Casual     Behavior:  Appropriate and Sharing  Motor:  Normal  Speech/Language:   Normal Rate  Affect:  Appropriate, Congruent and Tearful  Mood:  depressed, irritable and sad  Thought process:  normal  Thought content:    WNL  Sensory/Perceptual disturbances:    WNL  Orientation:  oriented to person, place, time/date, situation and day of week  Attention:  Good  Concentration:  Good  Memory:  WNL  Fund of knowledge:   Good  Insight:    Good  Judgment:   Good  Impulse Control:  Good   Risk Assessment: Danger to Self:  No Self-injurious Behavior: No Danger to Others: No Duty to Warn:no Physical Aggression / Violence:No  Access to Firearms a concern: No  Gang Involvement:No   Subjective: Patient was engaged and cooperative throughout the session using time effectively to discuss thoughts and feelings. Patient voices continued motivation for treatment and understanding of mood and anxiety issues. Patient is likely to benefit from future treatment because she remains motivated to decrease mood and anxiety issues and reports benefit of the combination of regular sessions and medication management in addressing these symptoms.    Interventions: Cognitive Behavioral Therapy Established psychological safety. Checked in with patient regarding her week. Actively listened to patient verbally ventilate distress related to living environment providing validation of feelings. Reviewed mindfulness strategies to acknowledge with out judgement. Provided support through active listening, validation of feelings, and highlighted patient's strengths.     Diagnosis:   ICD-10-CM   1. Mild bipolar II disorder, most recent episode major depressive  (HCC)  F31.81   2. Generalized anxiety disorder  F41.1    Plan: Patient's goal SE:GBTD with anxiety and working through things.She reports benefit from having someone to listen to her.  Treatment Target: Understand the relationship between thoughts, emotions, and behaviors   Psychoeducation on CBT model   Teach the connection between thoughts, emotions, and behaviors   Treatment Target: Increase realistic balanced thinking   Explore patient's thoughts, beliefs, automatic thoughts, assumptions   Identify unhelpful thinking patterns   Process distress and allow for emotional release   Questioning and challenging thoughts  Cognitive reappraisal   Treatment Target: Increase coping skills  Mindfulness practices   Deep breathing   Teach distress tolerance techniques -"what helps me"  STOP technique  Treatment Target: Improve relationship satisfaction with spouse   Communication techniques  Boundary setting   Relationship values exploration  Treatment Target 1: Increase knowledge of child development and positive parenting practices and increase parenting confidence  Objective 1: Through use of Triple P level 4 and/or Stepping Stones, increase parenting confidence and build awareness of child development, and increase parenting skills  Provide psychoeducation on Triple P positive parenting   Explore current parenting strategies   Parenting coaching for developing positive relationship with patient, encouraging desirable behavior, developing clear boundaries and behavioral expectations, providing effective commands and managing misbehavior.   Provide opportunity to explore interventions from Triple P that can support patient in effective parenting practices  Teach behavior management techniques from Triple P to reduce negative behaviors, including consistency, age-appropriate expectations, prompting positive behaviors, using praise and rewards, using clear  instructions, and natural consequences to help with listening.  Explore barriers to effective  parenting and problem solve  Future Appointments  Date Time Provider Department Center  05/14/2020 12:30 PM [facility] None  05/22/2020  2:30 PM Kathreen Cosier, LCSW [facility] None   Interpreter used: NA  Kathreen Cosier, LCSW

## 2020-05-10 DIAGNOSIS — O30042 Twin pregnancy, dichorionic/diamniotic, second trimester: Secondary | ICD-10-CM | POA: Diagnosis not present

## 2020-05-10 DIAGNOSIS — O09892 Supervision of other high risk pregnancies, second trimester: Secondary | ICD-10-CM | POA: Diagnosis not present

## 2020-05-14 ENCOUNTER — Other Ambulatory Visit: Payer: BC Managed Care – PPO

## 2020-05-15 ENCOUNTER — Telehealth: Payer: Self-pay | Admitting: Neurology

## 2020-05-15 ENCOUNTER — Ambulatory Visit: Payer: BC Managed Care – PPO

## 2020-05-15 ENCOUNTER — Other Ambulatory Visit: Payer: Self-pay

## 2020-05-15 DIAGNOSIS — H9191 Unspecified hearing loss, right ear: Secondary | ICD-10-CM | POA: Diagnosis not present

## 2020-05-15 DIAGNOSIS — G500 Trigeminal neuralgia: Secondary | ICD-10-CM | POA: Diagnosis not present

## 2020-05-15 DIAGNOSIS — R42 Dizziness and giddiness: Secondary | ICD-10-CM | POA: Diagnosis not present

## 2020-05-15 NOTE — Telephone Encounter (Signed)
Patient came in for her MRI appointment and told us she also has a new medicaid plan that she never had before she was made of aware that we will not be filing the medicaid plan only the [facility] plan because we do not take medicaid for MR's at our office.

## 2020-05-15 NOTE — Telephone Encounter (Signed)
Can you please put a MRI Brain wo contrast order in as soon as possible for some reason she was scheduled yesterday but didn't make it and it won't let me scheduled. She is coming in today thank you!!

## 2020-05-15 NOTE — Telephone Encounter (Signed)
Noted, thank you

## 2020-05-21 DIAGNOSIS — M530 Cervicocranial syndrome: Secondary | ICD-10-CM | POA: Diagnosis not present

## 2020-05-21 DIAGNOSIS — R42 Dizziness and giddiness: Secondary | ICD-10-CM | POA: Diagnosis not present

## 2020-05-21 DIAGNOSIS — F411 Generalized anxiety disorder: Secondary | ICD-10-CM | POA: Diagnosis not present

## 2020-05-21 DIAGNOSIS — F3174 Bipolar disorder, in full remission, most recent episode manic: Secondary | ICD-10-CM | POA: Diagnosis not present

## 2020-05-21 DIAGNOSIS — M9901 Segmental and somatic dysfunction of cervical region: Secondary | ICD-10-CM | POA: Diagnosis not present

## 2020-05-21 DIAGNOSIS — F3181 Bipolar II disorder: Secondary | ICD-10-CM | POA: Diagnosis not present

## 2020-05-21 DIAGNOSIS — M9900 Segmental and somatic dysfunction of head region: Secondary | ICD-10-CM | POA: Diagnosis not present

## 2020-05-22 ENCOUNTER — Ambulatory Visit: Payer: Medicaid Other | Admitting: Licensed Clinical Social Worker

## 2020-05-27 ENCOUNTER — Ambulatory Visit: Payer: Medicaid Other | Admitting: Licensed Clinical Social Worker

## 2020-05-27 DIAGNOSIS — F411 Generalized anxiety disorder: Secondary | ICD-10-CM

## 2020-05-27 DIAGNOSIS — F3181 Bipolar II disorder: Secondary | ICD-10-CM

## 2020-05-27 NOTE — Progress Notes (Signed)
Counselor/Therapist Progress Note  [name], MRN: [id number],    Date: 05/27/2020  Time Spent: 47 minutes   Treatment Type: Individual Therapy  Reported Symptoms: anxiety, overwhelm, irritability; mood instability  Mental Status Exam:  Appearance:   Casual     Behavior:  Appropriate and Sharing  Motor:  Normal  Speech/Language:   Normal Rate  Affect:  Appropriate and Congruent  Mood:  dysthymic  Thought process:  normal  Thought content:    WNL  Sensory/Perceptual disturbances:    WNL  Orientation:  oriented to person, place, time/date, situation and day of week  Attention:  Good  Concentration:  Good  Memory:  WNL  Fund of knowledge:   Good  Insight:    Good  Judgment:   Good  Impulse Control:  Good   Risk Assessment: Danger to Self:  No Self-injurious Behavior: No Danger to Others: No Duty to Warn:no Physical Aggression / Violence:No  Access to Firearms a concern: No  Gang Involvement:No   Subjective: Patient was engaged and cooperative throughout the session using time effectively to discuss thoughts and feelings. Patient voices continued motivation for treatment and understanding of mood and anxiety issues. Patient is likely to benefit from future treatment because she remains motivated to decrease mood and anxiety symptoms  and reports benefit of regular sessions in addressing these symptoms.   Interventions: Cognitive Behavioral Therapy  Established psychological safety. Engaged patient in processing current psychosocial stressors - mood instability and anxiety due to multiple stressors. Explored patient's perception of psychosocial stressors, validating patient's feelings of overwhelm and frustration. Highlighted patient's unhelpful thoughts leading to increased distress and reframed these thoughts; and discussed catastrophizing. Encouraged patient to use positve self-statements and to speak to herself as she does to her friends. Provided support  through active listening, validation of feelings, and highlighted patient's strengths.   Diagnosis:   ICD-10-CM   1. Mild bipolar II disorder, most recent episode major depressive (HCC)  F31.81   2. Generalized anxiety disorder  F41.1    Plan: Patient's goal XQ:JJHE with anxiety and working through things.She reports benefit from having someone to listen to her.  Treatment Target: Understand the relationship between thoughts, emotions, and behaviors   Psychoeducation on CBT model   Teach the connection between thoughts, emotions, and behaviors   Treatment Target: Increase realistic balanced thinking   Explore patient's thoughts, beliefs, automatic thoughts, assumptions   Identify unhelpful thinking patterns   Process distress and allow for emotional release   Questioning and challenging thoughts  Cognitive reappraisal   Treatment Target: Increase coping skills  Mindfulness practices   Deep breathing   Teach distress tolerance techniques -"what helps me"  STOP technique  Treatment Target: Improve relationship satisfaction with spouse   Communication techniques  Boundary setting   Relationship values exploration  Treatment Target 1: Increase knowledge of child development and positive parenting practices and increase parenting confidence  Objective 1: Through use of Triple P level 4 and/or Stepping Stones, increase parenting confidence and build awareness of child development, and increase parenting skills  Provide psychoeducation on Triple P positive parenting   Explore current parenting strategies   Parenting coaching for developing positive relationship with patient, encouraging desirable behavior, developing clear boundaries and behavioral expectations, providing effective commands and managing misbehavior.   Provide opportunity to explore interventions from Triple P that can support patient in effective parenting practices  Teach behavior  management techniques from Triple P to reduce negative behaviors, including consistency, age-appropriate expectations, prompting  positive behaviors, using praise and rewards, using clear instructions, and natural consequences to help with listening.  Explore barriers to effective parenting and problem solve  Future Appointments  Date Time Provider Department Center  06/06/2020  4:30 PM Kathreen Cosier, LCSW [facility] None    Interpreter used: NA  Kathreen Cosier, LCSW

## 2020-06-05 DIAGNOSIS — O30042 Twin pregnancy, dichorionic/diamniotic, second trimester: Secondary | ICD-10-CM | POA: Diagnosis not present

## 2020-06-05 DIAGNOSIS — O210 Mild hyperemesis gravidarum: Secondary | ICD-10-CM | POA: Diagnosis not present

## 2020-06-05 DIAGNOSIS — Z3A26 26 weeks gestation of pregnancy: Secondary | ICD-10-CM | POA: Diagnosis not present

## 2020-06-05 DIAGNOSIS — O09892 Supervision of other high risk pregnancies, second trimester: Secondary | ICD-10-CM | POA: Diagnosis not present

## 2020-06-06 ENCOUNTER — Ambulatory Visit: Payer: Medicaid Other | Admitting: Licensed Clinical Social Worker

## 2020-06-12 DIAGNOSIS — O09892 Supervision of other high risk pregnancies, second trimester: Secondary | ICD-10-CM | POA: Diagnosis not present

## 2020-06-12 DIAGNOSIS — O26893 Other specified pregnancy related conditions, third trimester: Secondary | ICD-10-CM | POA: Diagnosis not present

## 2020-06-12 DIAGNOSIS — O30042 Twin pregnancy, dichorionic/diamniotic, second trimester: Secondary | ICD-10-CM | POA: Diagnosis not present

## 2020-06-12 DIAGNOSIS — R300 Dysuria: Secondary | ICD-10-CM | POA: Diagnosis not present

## 2020-06-12 DIAGNOSIS — N898 Other specified noninflammatory disorders of vagina: Secondary | ICD-10-CM | POA: Diagnosis not present

## 2020-06-17 ENCOUNTER — Observation Stay
Admission: EM | Admit: 2020-06-17 | Discharge: 2020-06-18 | Disposition: A | Discharge status: Home / Self Care | Payer: BC Managed Care – PPO | Attending: Obstetrics and Gynecology | Admitting: Obstetrics and Gynecology

## 2020-06-17 ENCOUNTER — Other Ambulatory Visit: Payer: Self-pay

## 2020-06-17 DIAGNOSIS — O30043 Twin pregnancy, dichorionic/diamniotic, third trimester: Secondary | ICD-10-CM | POA: Diagnosis not present

## 2020-06-17 DIAGNOSIS — Z3A28 28 weeks gestation of pregnancy: Secondary | ICD-10-CM | POA: Diagnosis not present

## 2020-06-17 LAB — URINALYSIS, COMPLETE (UACMP) WITH MICROSCOPIC
Bilirubin Urine: NEGATIVE
Glucose, UA: NEGATIVE mg/dL
Hgb urine dipstick: NEGATIVE
Ketones, ur: 20 mg/dL — AB
Leukocytes,Ua: NEGATIVE
Nitrite: NEGATIVE
Protein, ur: 30 mg/dL — AB
Specific Gravity, Urine: 1.02 (ref 1.005–1.030)
pH: 6 (ref 5.0–8.0)

## 2020-06-17 LAB — WET PREP, GENITAL
Clue Cells Wet Prep HPF POC: NONE SEEN
Sperm: NONE SEEN
Trich, Wet Prep: NONE SEEN
Yeast Wet Prep HPF POC: NONE SEEN

## 2020-06-17 MED ORDER — LAMOTRIGINE 100 MG PO TABS
100.0000 mg | ORAL_TABLET | Freq: Once | ORAL | Status: AC
  Administered 2020-06-18: 100 mg via ORAL
  Filled 2020-06-17: qty 1

## 2020-06-17 MED ORDER — LACTATED RINGERS IV SOLN: INTRAVENOUS | Status: DC

## 2020-06-17 MED ORDER — BUSPIRONE HCL 10 MG PO TABS
10.0000 mg | ORAL_TABLET | Freq: Once | ORAL | Status: AC
  Administered 2020-06-18: 10 mg via ORAL
  Filled 2020-06-17: qty 1

## 2020-06-17 MED ORDER — ACETAMINOPHEN 325 MG PO TABS
650.0000 mg | ORAL_TABLET | ORAL | Status: DC | PRN
  Administered 2020-06-17: 650 mg via ORAL
  Filled 2020-06-17: qty 2

## 2020-06-17 NOTE — OB Triage Note (Signed)
Pt. G2P1 [redacted]w[redacted]d, twin pregnancy, Presents to birthplace through the ED w/ c/o ctx for the past few days, states provider told her to come in if they become more frequent more than 4 444 states no LOF, no vag bleeding and reports positive fetal movement for both babies. VSS. Monitors applied and assessing.

## 2020-06-17 NOTE — Discharge Summary (Signed)
[name] MRN: [id number] DOB/AGE: [birth year] 24 y.o.  Admit date: 06/17/2020 Discharge date: 06/18/2020  Admission Diagnoses: Preterm Labor  Factors complicating pregnancy Di/di twin Pregnancy Hyperemesis Prepregnancy BMI 37 Mild Anemia- 2 PNV  History of anxiety, bipolar, depression Varicella NON Immune  Discharge Diagnoses: UCs with no cervical change and neg FFN  Prenatal Procedures: NST  Consults: none  Significant Diagnostic Studies:  Results for orders placed or performed during the hospital encounter of 06/17/20 (from the past 168 hour(s))  Wet prep, genital   Collection Time: 06/17/20 10:35 PM   Specimen: Vaginal  Result Value Ref Range   Yeast Wet Prep HPF POC NONE SEEN NONE SEEN   Trich, Wet Prep NONE SEEN NONE SEEN   Clue Cells Wet Prep HPF POC NONE SEEN NONE SEEN   WBC, Wet Prep HPF POC MODERATE (A) NONE SEEN   Sperm NONE SEEN   Urinalysis, Complete w Microscopic Urine, Clean Catch   Collection Time: 06/17/20 10:35 PM  Result Value Ref Range   Color, Urine YELLOW (A) YELLOW   APPearance HAZY (A) CLEAR   Specific Gravity, Urine 1.020 1.005 - 1.030   pH 6.0 5.0 - 8.0   Glucose, UA NEGATIVE NEGATIVE mg/dL   Hgb urine dipstick NEGATIVE NEGATIVE   Bilirubin Urine NEGATIVE NEGATIVE   Ketones, ur 20 (A) NEGATIVE mg/dL   Protein, ur 30 (A) NEGATIVE mg/dL   Nitrite NEGATIVE NEGATIVE   Leukocytes,Ua NEGATIVE NEGATIVE   RBC / HPF 0-5 0 - 5 RBC/hpf   WBC, UA 0-5 0 - 5 WBC/hpf   Bacteria, UA RARE (A) NONE SEEN   Squamous Epithelial / LPF 6-10 0 - 5   Mucus PRESENT   Fetal fibronectin   Collection Time: 06/17/20 10:35 PM  Result Value Ref Range   Fetal Fibronectin NEGATIVE NEGATIVE    Treatments: none  Hospital Course:  This is a 24 y.o. G2P1001 with IUP at [redacted]w[redacted]d seen for uterine contractions with twin gestation, noted to have a cervical exam of 0/25/high.  No leaking of fluid and no bleeding.  She was observed over night, fetal heart rate  monitoring remained reassuring, and she had no signs/symptoms of progressing preterm labor or other maternal-fetal concerns. Labs were negative.  Her cervical exam was unchanged from admission.  She was deemed stable for discharge to home with outpatient follow up tomorrow already scheduled.  Discharge Physical Exam:  BP 122/72 (BP Location: Left Leg)    Pulse (!) 104    Temp 98.3 F (36.8 C) (Oral)    Resp 20    Ht 5\' 1"  (1.549 m)    Wt 101.2 kg    LMP 11/27/2019    BMI 42.14 kg/m   General: NAD CV: RRR Pulm: CTABL, nl effort ABD: s/nd/nt, gravid DVT Evaluation: LE non-ttp, no evidence of DVT on exam.  FHR: appropriate for GA x2  TOCO: quiet Dilation: Closed Effacement (%): 20, 30 Cervical Position: Posterior Station: Ballotable Exam by:: Kodee Drury CNM   Discharge Condition: Stable  Disposition: Discharge disposition: 01-Home or Self Care        Allergies as of 06/18/2020      Reactions   Amoxicillin Shortness Of Breath   Dizziness, shortness of breath, nausea.   Other    Must have anti-emetic prior to being given any narcotics    Latex Rash      Medication List    STOP taking these medications   doxylamine (Sleep) 25 MG tablet Commonly known as: 06/20/2020  metoCLOPramide 10 MG tablet Commonly known as: REGLAN     TAKE these medications   acetaminophen 500 MG tablet Commonly known as: TYLENOL Take 500 mg by mouth every 6 (six) hours as needed for headache.   albuterol 108 (90 Base) MCG/ACT inhaler Commonly known as: VENTOLIN HFA Inhale 1-2 puffs into the lungs every 6 (six) hours as needed for wheezing or shortness of breath.   albuterol (2.5 MG/3ML) 0.083% nebulizer solution Commonly known as: PROVENTIL Take 3 mLs (2.5 mg total) by nebulization every 6 (six) hours as needed for wheezing or shortness of breath.   budesonide-formoterol 80-4.5 MCG/ACT inhaler Commonly known as: SYMBICORT Inhale 2 puffs into the lungs 2 (two) times daily.   lamoTRIgine 200  MG tablet Commonly known as: LAMICTAL Take 200 mg by mouth daily.   prenatal multivitamin Tabs tablet Take 2 tablets by mouth daily.   promethazine 12.5 MG tablet Commonly known as: PHENERGAN Take 12.5-25 mg by mouth every 4 (four) hours as needed.   VITAMIN B-6 PO Take by mouth.       Follow-up Information    Gustavo Lah, CNM. Go in 1 day(s).   Specialty: Certified Nurse Midwife Why: for your appointment Contact information: [address] [place] [address] [phone]               Signed:  Quillian Quince 06/18/2020 7:22 AM

## 2020-06-18 ENCOUNTER — Ambulatory Visit: Payer: Medicaid Other | Admitting: Licensed Clinical Social Worker

## 2020-06-18 DIAGNOSIS — O30043 Twin pregnancy, dichorionic/diamniotic, third trimester: Secondary | ICD-10-CM | POA: Diagnosis not present

## 2020-06-18 DIAGNOSIS — Z3A28 28 weeks gestation of pregnancy: Secondary | ICD-10-CM | POA: Diagnosis not present

## 2020-06-18 DIAGNOSIS — F3181 Bipolar II disorder: Secondary | ICD-10-CM

## 2020-06-18 LAB — URINE CULTURE

## 2020-06-18 LAB — FETAL FIBRONECTIN: Fetal Fibronectin: NEGATIVE

## 2020-06-18 NOTE — Discharge Instructions (Signed)
Preterm Labor and Birth Information ° °The normal length of a pregnancy is 39-41 weeks. Preterm labor is when labor starts before 37 completed weeks of pregnancy. °What are the risk factors for preterm labor? °Preterm labor is more likely to occur in women who: °· Have certain infections during pregnancy such as a bladder infection, sexually transmitted infection, or infection inside the uterus (chorioamnionitis). °· Have a shorter-than-normal cervix. °· Have gone into preterm labor before. °· Have had surgery on their cervix. °· Are younger than age 17 or older than age 35. °· Are African American. °· Are pregnant with twins or multiple babies (multiple gestation). °· Take street drugs or smoke while pregnant. °· Do not gain enough weight while pregnant. °· Became pregnant shortly after having been pregnant. °What are the symptoms of preterm labor? °Symptoms of preterm labor include: °· Cramps similar to those that can happen during a menstrual period. The cramps may happen with diarrhea. °· Pain in the abdomen or lower back. °· Regular uterine contractions that may feel like tightening of the abdomen. °· A feeling of increased pressure in the pelvis. °· Increased watery or bloody mucus discharge from the vagina. °· Water breaking (ruptured amniotic sac). °Why is it important to recognize signs of preterm labor? °It is important to recognize signs of preterm labor because babies who are born prematurely may not be fully developed. This can put them at an increased risk for: °· Long-term (chronic) heart and lung problems. °· Difficulty immediately after birth with regulating body systems, including blood sugar, body temperature, heart rate, and breathing rate. °· Bleeding in the brain. °· Cerebral palsy. °· Learning difficulties. °· Death. °These risks are highest for babies who are born before 34 weeks of pregnancy. °How is preterm labor treated? °Treatment depends on the length of your pregnancy, your condition,  and the health of your baby. It may involve: °· Having a stitch (suture) placed in your cervix to prevent your cervix from opening too early (cerclage). °· Taking or being given medicines, such as: °? Hormone medicines. These may be given early in pregnancy to help support the pregnancy. °? Medicine to stop contractions. °? Medicines to help mature the baby’s lungs. These may be prescribed if the risk of delivery is high. °? Medicines to prevent your baby from developing cerebral palsy. °If the labor happens before 34 weeks of pregnancy, you may need to stay in the hospital. °What should I do if I think I am in preterm labor? °If you think that you are going into preterm labor, call your health care provider right away. °How can I prevent preterm labor in future pregnancies? °To increase your chance of having a full-term pregnancy: °· Do not use any tobacco products, such as cigarettes, chewing tobacco, and e-cigarettes. If you need help quitting, ask your health care provider. °· Do not use street drugs or medicines that have not been prescribed to you during your pregnancy. °· Talk with your health care provider before taking any herbal supplements, even if you have been taking them regularly. °· Make sure you gain a healthy amount of weight during your pregnancy. °· Watch for infection. If you think that you might have an infection, get it checked right away. °· Make sure to tell your health care provider if you have gone into preterm labor before. °This information is not intended to replace advice given to you by your health care provider. Make sure you discuss any questions you have with your   health care provider. °Document Revised: 01/27/2019 Document Reviewed: 02/26/2016 °Elsevier Patient Education © 2020 [employer] ° °

## 2020-06-18 NOTE — Progress Notes (Signed)
Counselor/Therapist Progress Note  [name], MRN: [id number],    Date: 06/18/2020  Time Spent: 20 minutes   Treatment Type: Individual Therapy  Reported Symptoms: overall stable mood, irritability, stress, sadness at times  Mental Status Exam:  Appearance:   Casual     Behavior:  Appropriate and Sharing  Motor:  Normal  Speech/Language:   Normal Rate  Affect:  Appropriate and Congruent  Mood:  normal  Thought process:  normal  Thought content:    WNL  Sensory/Perceptual disturbances:    WNL  Orientation:  oriented to person, place, time/date, situation and day of week  Attention:  Good  Concentration:  Good  Memory:  WNL  Fund of knowledge:   Good  Insight:    Good  Judgment:   Good  Impulse Control:  Good   Risk Assessment: Danger to Self:  No Self-injurious Behavior: No Danger to Others: No Duty to Warn:no Physical Aggression / Violence:No  Access to Firearms a concern: No  Gang Involvement:No   Subjective: Patient was engaged and cooperative throughout the brief session using time effectively to discuss thoughts and feelings. Patient continues to voice motivation for treatment and understanding of  Mood and anxiety issues. Patient is likely to benefit from future treatment because she remains motivated to manage symptoms and reports benefit of the combination of medication management and regular sessions in addressing symptoms.   Interventions: Supportive therapy  Established psychological safety. Checked in with patient regarding mood and current psychosocial stressors, overall mood stability with stressors related to pregnancy complications. Validated patient's feelings of stress and occasional sadness related to challenges with pregnancy. Discussed support and encouraged patient to continue self-care. Provided support through active listening, validation of feelings, and highlighted patient's strengths.   Diagnosis:   ICD-10-CM   1. Mild bipolar II  disorder, most recent episode major depressive (HCC)  F31.81     Plan: Patient's goal KZ:LDJT with anxiety and working through things.She reports benefit from having someone to listen to her.  Treatment Target: Understand the relationship between thoughts, emotions, and behaviors   Psychoeducation on CBT model   Teach the connection between thoughts, emotions, and behaviors   Treatment Target: Increase realistic balanced thinking   Explore patient's thoughts, beliefs, automatic thoughts, assumptions   Identify unhelpful thinking patterns   Process distress and allow for emotional release   Questioning and challenging thoughts  Cognitive reappraisal   Treatment Target: Increase coping skills  Mindfulness practices   Deep breathing   Teach distress tolerance techniques -"what helps me"  STOP technique  Treatment Target: Improve relationship satisfaction with spouse   Communication techniques  Boundary setting   Relationship values exploration  Treatment Target 1: Increase knowledge of child development and positive parenting practices and increase parenting confidence  Objective 1: Through use of Triple P level 4 and/or Stepping Stones, increase parenting confidence and build awareness of child development, and increase parenting skills  Provide psychoeducation on Triple P positive parenting   Explore current parenting strategies   Parenting coaching for developing positive relationship with patient, encouraging desirable behavior, developing clear boundaries and behavioral expectations, providing effective commands and managing misbehavior.   Provide opportunity to explore interventions from Triple P that can support patient in effective parenting practices  Teach behavior management techniques from Triple P to reduce negative behaviors, including consistency, age-appropriate expectations, prompting positive behaviors, using praise and rewards, using clear  instructions, and natural consequences to help with listening.  Explore barriers to effective parenting  and problem solve   Future Appointments  Date Time Provider Department Center  07/10/2020  5:00 PM Kathreen Cosier, LCSW [facility] None    Interpreter used: NA  Kathreen Cosier, LCSW

## 2020-06-19 DIAGNOSIS — O09893 Supervision of other high risk pregnancies, third trimester: Secondary | ICD-10-CM | POA: Diagnosis not present

## 2020-06-19 DIAGNOSIS — O09892 Supervision of other high risk pregnancies, second trimester: Secondary | ICD-10-CM | POA: Diagnosis not present

## 2020-06-19 DIAGNOSIS — Z23 Encounter for immunization: Secondary | ICD-10-CM | POA: Diagnosis not present

## 2020-06-19 DIAGNOSIS — Z419 Encounter for procedure for purposes other than remedying health state, unspecified: Secondary | ICD-10-CM | POA: Diagnosis not present

## 2020-06-19 DIAGNOSIS — Z3A28 28 weeks gestation of pregnancy: Secondary | ICD-10-CM | POA: Diagnosis not present

## 2020-06-19 DIAGNOSIS — O30043 Twin pregnancy, dichorionic/diamniotic, third trimester: Secondary | ICD-10-CM | POA: Diagnosis not present

## 2020-07-03 DIAGNOSIS — Z23 Encounter for immunization: Secondary | ICD-10-CM | POA: Diagnosis not present

## 2020-07-03 DIAGNOSIS — O30043 Twin pregnancy, dichorionic/diamniotic, third trimester: Secondary | ICD-10-CM | POA: Diagnosis not present

## 2020-07-03 DIAGNOSIS — O09893 Supervision of other high risk pregnancies, third trimester: Secondary | ICD-10-CM | POA: Diagnosis not present

## 2020-07-10 ENCOUNTER — Encounter: Payer: Self-pay | Admitting: Obstetrics and Gynecology

## 2020-07-10 ENCOUNTER — Observation Stay
Admission: EM | Admit: 2020-07-10 | Discharge: 2020-07-11 | Disposition: A | Discharge status: Home / Self Care | Payer: BC Managed Care – PPO | Attending: Obstetrics and Gynecology | Admitting: Obstetrics and Gynecology

## 2020-07-10 ENCOUNTER — Ambulatory Visit: Payer: Medicaid Other | Admitting: Licensed Clinical Social Worker

## 2020-07-10 ENCOUNTER — Other Ambulatory Visit: Payer: Self-pay

## 2020-07-10 DIAGNOSIS — O30043 Twin pregnancy, dichorionic/diamniotic, third trimester: Secondary | ICD-10-CM | POA: Insufficient documentation

## 2020-07-10 DIAGNOSIS — F3181 Bipolar II disorder: Secondary | ICD-10-CM

## 2020-07-10 DIAGNOSIS — O99891 Other specified diseases and conditions complicating pregnancy: Secondary | ICD-10-CM | POA: Diagnosis not present

## 2020-07-10 DIAGNOSIS — M545 Low back pain: Secondary | ICD-10-CM | POA: Diagnosis not present

## 2020-07-10 DIAGNOSIS — Z3A31 31 weeks gestation of pregnancy: Secondary | ICD-10-CM | POA: Insufficient documentation

## 2020-07-10 DIAGNOSIS — Z91040 Latex allergy status: Secondary | ICD-10-CM | POA: Insufficient documentation

## 2020-07-10 DIAGNOSIS — F411 Generalized anxiety disorder: Secondary | ICD-10-CM

## 2020-07-10 LAB — WET PREP, GENITAL
Clue Cells Wet Prep HPF POC: NONE SEEN
Sperm: NONE SEEN
Trich, Wet Prep: NONE SEEN
WBC, Wet Prep HPF POC: NONE SEEN
Yeast Wet Prep HPF POC: NONE SEEN

## 2020-07-10 LAB — URINALYSIS, COMPLETE (UACMP) WITH MICROSCOPIC
Bilirubin Urine: NEGATIVE
Glucose, UA: NEGATIVE mg/dL
Hgb urine dipstick: NEGATIVE
Ketones, ur: NEGATIVE mg/dL
Leukocytes,Ua: NEGATIVE
Nitrite: NEGATIVE
Protein, ur: NEGATIVE mg/dL
Specific Gravity, Urine: 1.021 (ref 1.005–1.030)
pH: 6 (ref 5.0–8.0)

## 2020-07-10 LAB — CHLAMYDIA/NGC RT PCR (ARMC ONLY)
Chlamydia Tr: NOT DETECTED
N gonorrhoeae: NOT DETECTED

## 2020-07-10 LAB — FETAL FIBRONECTIN: Fetal Fibronectin: NEGATIVE

## 2020-07-10 MED ORDER — ACETAMINOPHEN 500 MG PO TABS
1000.0000 mg | ORAL_TABLET | Freq: Four times a day (QID) | ORAL | Status: DC | PRN
  Administered 2020-07-10 – 2020-07-11 (×2): 1000 mg via ORAL
  Filled 2020-07-10 (×2): qty 2

## 2020-07-10 MED ORDER — BUSPIRONE HCL 10 MG PO TABS
10.0000 mg | ORAL_TABLET | Freq: Every evening | ORAL | Status: AC
  Administered 2020-07-11: 10 mg via ORAL
  Filled 2020-07-10: qty 1

## 2020-07-10 MED ORDER — TERBUTALINE SULFATE 1 MG/ML IJ SOLN
0.2500 mg | Freq: Once | INTRAMUSCULAR | Status: AC
  Administered 2020-07-10: 0.25 mg via SUBCUTANEOUS
  Filled 2020-07-10: qty 1

## 2020-07-10 MED ORDER — ZOLPIDEM TARTRATE 5 MG PO TABS: 5.0000 mg | ORAL_TABLET | Freq: Once | ORAL | Status: DC

## 2020-07-10 MED ORDER — LAMOTRIGINE 25 MG PO TABS
125.0000 mg | ORAL_TABLET | Freq: Every day | ORAL | Status: DC
  Administered 2020-07-11: 125 mg via ORAL
  Filled 2020-07-10: qty 1

## 2020-07-10 MED ORDER — ACETAMINOPHEN 325 MG PO TABS: 650.0000 mg | ORAL_TABLET | ORAL | Status: DC | PRN

## 2020-07-10 MED ORDER — ZOLPIDEM TARTRATE 5 MG PO TABS: 10.0000 mg | ORAL_TABLET | Freq: Once | ORAL | Status: DC

## 2020-07-10 MED ORDER — TERBUTALINE SULFATE 1 MG/ML IJ SOLN: 0.2500 mg | INTRAMUSCULAR | Status: DC | PRN

## 2020-07-10 MED ORDER — NIFEDIPINE 10 MG PO CAPS: 20.0000 mg | ORAL_CAPSULE | Freq: Once | ORAL | Status: DC

## 2020-07-10 NOTE — Progress Notes (Signed)
Counselor/Therapist Progress Note  [name], MRN: [id number],    Date: 07/10/2020  Time Spent: 10-15 minutes (patient currently in the hospital)   Treatment Type: Individual Therapy  Reported Symptoms: mild emotionality  Mental Status Exam:  Appearance:   Casual and in the hospital     Behavior:  Appropriate and Sharing  Motor:  Normal  Speech/Language:   Normal Rate  Affect:  Appropriate and Congruent  Mood:  normal  Thought process:  normal  Thought content:    WNL  Sensory/Perceptual disturbances:    WNL  Orientation:  oriented to person, place, time/date, situation, day of week and month of year  Attention:  Good  Concentration:  Good  Memory:  WNL  Fund of knowledge:   Good  Insight:    Good  Judgment:   Good  Impulse Control:  Good   Risk Assessment: Danger to Self:  No Self-injurious Behavior: No Danger to Others: No Duty to Warn:no Physical Aggression / Violence:No  Access to Firearms a concern: No  Gang Involvement:No   Subjective: Patient was engaged and cooperative throughout the brief session using time effectively to discuss thoughts and feelings.  Patient voices some increase in emotionality but overall stable mood. Patient is likely to benefit from future treatment because she remains motivated to decrease mood and anxiety symptoms and reports benefit form the combination of medication management and talk therapy in  in addressing these symptoms.    Interventions: Brief Intervention / mood check  Established psychological safety. Checked in with patient regarding her past few weeks. Briefly discussed current symptoms and psychosocial stressors. Closed out session due to patient currently being in the hospital on labor and delivery. Provided support through active listening, validation of feelings, and highlighted patient's strengths.   Diagnosis:   ICD-10-CM   1. Mild bipolar II disorder, most recent episode major depressive (HCC)  F31.81    2. Generalized anxiety disorder  F41.1    Plan: Patient's goal NW:GNFA with anxiety and working through things.She reports benefit from having someone to listen to her.  Treatment Target: Understand the relationship between thoughts, emotions, and behaviors   Psychoeducation on CBT model   Teach the connection between thoughts, emotions, and behaviors   Treatment Target: Increase realistic balanced thinking   Explore patient's thoughts, beliefs, automatic thoughts, assumptions   Identify unhelpful thinking patterns   Process distress and allow for emotional release   Questioning and challenging thoughts  Cognitive reappraisal   Treatment Target: Increase coping skills  Mindfulness practices   Deep breathing   Teach distress tolerance techniques -"what helps me"  STOP technique  Treatment Target: Improve relationship satisfaction with spouse   Communication techniques  Boundary setting   Relationship values exploration  Treatment Target 1: Increase knowledge of child development and positive parenting practices and increase parenting confidence  Objective 1: Through use of Triple P level 4 and/or Stepping Stones, increase parenting confidence and build awareness of child development, and increase parenting skills  Provide psychoeducation on Triple P positive parenting   Explore current parenting strategies   Parenting coaching for developing positive relationship with patient, encouraging desirable behavior, developing clear boundaries and behavioral expectations, providing effective commands and managing misbehavior.   Provide opportunity to explore interventions from Triple P that can support patient in effective parenting practices  Teach behavior management techniques from Triple P to reduce negative behaviors, including consistency, age-appropriate expectations, prompting positive behaviors, using praise and rewards, using clear instructions, and  natural consequences  to help with listening.  Explore barriers to effective parenting and problem solve  Future Appointments  Date Time Provider Department Center  07/17/2020  2:00 PM Kathreen Cosier, LCSW [facility] None    Interpreter used: NA  Kathreen Cosier, LCSW

## 2020-07-10 NOTE — OB Triage Note (Signed)
Pt sent from office for preterm labor eval. Pt reports feeling CTX every 8 minutes fro 1-2 hours on Monday. Since pt has continued having ctx a coupe times an hour and hasnt felt very good. Since pts NST in the office she notes having more freq CTX. +FM. denies LOF/ bleeding

## 2020-07-10 NOTE — Discharge Summary (Signed)
[name] MRN: [id number] DOB/AGE: [birth year] 26 y.o.  Admit date: 07/10/2020 Discharge date: 07/11/2020  Admission Diagnoses: Twin gestation with UCs  Discharge Diagnoses: back pain in pregnancy, preterm contractions, di-di twins.   Prenatal Procedures: NST  Consults: none  Significant Diagnostic Studies:  Results for orders placed or performed during the hospital encounter of 07/10/20 (from the past 168 hour(s))  Wet prep, genital   Collection Time: 07/10/20  4:22 PM  Result Value Ref Range   Yeast Wet Prep HPF POC NONE SEEN NONE SEEN   Trich, Wet Prep NONE SEEN NONE SEEN   Clue Cells Wet Prep HPF POC NONE SEEN NONE SEEN   WBC, Wet Prep HPF POC NONE SEEN NONE SEEN   Sperm NONE SEEN   Urinalysis, Complete w Microscopic   Collection Time: 07/10/20  4:22 PM  Result Value Ref Range   Color, Urine YELLOW (A) YELLOW   APPearance CLOUDY (A) CLEAR   Specific Gravity, Urine 1.021 1.005 - 1.030   pH 6.0 5.0 - 8.0   Glucose, UA NEGATIVE NEGATIVE mg/dL   Hgb urine dipstick NEGATIVE NEGATIVE   Bilirubin Urine NEGATIVE NEGATIVE   Ketones, ur NEGATIVE NEGATIVE mg/dL   Protein, ur NEGATIVE NEGATIVE mg/dL   Nitrite NEGATIVE NEGATIVE   Leukocytes,Ua NEGATIVE NEGATIVE   RBC / HPF 0-5 0 - 5 RBC/hpf   WBC, UA 0-5 0 - 5 WBC/hpf   Bacteria, UA RARE (A) NONE SEEN   Squamous Epithelial / LPF 0-5 0 - 5   Mucus PRESENT    Amorphous Crystal PRESENT   Chlamydia/NGC rt PCR ([facility] only)   Collection Time: 07/10/20  4:24 PM  Result Value Ref Range   Specimen source GC/Chlam ENDOCERVICAL    Chlamydia Tr NOT DETECTED NOT DETECTED   N gonorrhoeae NOT DETECTED NOT DETECTED  Fetal fibronectin   Collection Time: 07/10/20  4:24 PM  Result Value Ref Range   Fetal Fibronectin NEGATIVE NEGATIVE    Treatments: PO hydration, terbutaline x 1 dose, continuous monitoring.   Hospital Course:  This is a 24 y.o. G2P1001 with IUP at [redacted]w[redacted]d admitted for UCs at [redacted]w[redacted]d.  She was seen in triage for  similar complaints on 06/17/20.  Swabs collected and she was noted to have a cervical exam of FT/Long/High/Medium consistency.  No leaking of fluid and no bleeding.  She was given terbutaline x 1 dose with cessation of UCs. FFN negative and all UA/STI screening negative. Continued to have uterine irritability and severe back pain. Given PO flexeril and tylenol to help with back pain, reviewed comfort measures, stretching and exercises to help with ongoing back pain likely complicated by twin pregnancy and obesity. Procardia 10mg  IR given for contractions, at Discharge was having rare UC with irritability. Cervix was re-examined and found unchanged.   Discharge Physical Exam:  BP 128/71   Pulse 91   Temp 97.9 F (36.6 C) (Oral)   Resp 18   LMP 11/27/2019   General: NAD CV: RRR Pulm: CTABL, nl effort ABD: s/nd/nt, gravid DVT Evaluation: LE non-ttp, no evidence of DVT on exam.  NST Reactive x 2: completed 07/11/20  FHR A baseline: 140 bpm Variability: moderate Accelerations: yes Decelerations: none  FHR B baseline: 145 bpm Variability: moderate Accelerations: yes Decelerations: none  Time: 20 minutes Category/reactivity: reactive  TOCO: Occasional UC with UI noted.   SVE:  Dilation: Fingertip Effacement (%): Thick Cervical Position: Posterior Station: Ballotable Exam by:: R Sanjuanita Condrey CNM   Discharge Condition: Stable  Disposition:  Discharge disposition: 01-Home or Self Care       Discharge Instructions    Discharge activity:  No Restrictions   Complete by: As directed    Do not have sex or do anything that might make you have an orgasm   Complete by: As directed      Allergies as of 07/11/2020      Reactions   Amoxicillin Shortness Of Breath   Dizziness, shortness of breath, nausea.   Other    Must have anti-emetic prior to being given any narcotics    Latex Rash      Medication List    TAKE these medications   acetaminophen 500 MG tablet Commonly known  as: TYLENOL Take 2 tablets (1,000 mg total) by mouth every 6 (six) hours as needed for moderate pain. What changed:   how much to take  reasons to take this   albuterol 108 (90 Base) MCG/ACT inhaler Commonly known as: VENTOLIN HFA Inhale 1-2 puffs into the lungs every 6 (six) hours as needed for wheezing or shortness of breath.   albuterol (2.5 MG/3ML) 0.083% nebulizer solution Commonly known as: PROVENTIL Take 3 mLs (2.5 mg total) by nebulization every 6 (six) hours as needed for wheezing or shortness of breath.   budesonide-formoterol 80-4.5 MCG/ACT inhaler Commonly known as: SYMBICORT Inhale 2 puffs into the lungs 2 (two) times daily.   cyclobenzaprine 10 MG tablet Commonly known as: FLEXERIL Take 1 tablet (10 mg total) by mouth 3 (three) times daily as needed for muscle spasms.   lamoTRIgine 25 MG tablet Commonly known as: LAMICTAL Take 5 tablets (125 mg total) by mouth daily. What changed:   medication strength  how much to take   NIFEdipine 10 MG capsule Commonly known as: PROCARDIA Take 1 capsule (10 mg total) by mouth every 6 (six) hours as needed (preterm contractions).   prenatal multivitamin Tabs tablet Take 2 tablets by mouth daily.   promethazine 12.5 MG tablet Commonly known as: PHENERGAN Take 12.5-25 mg by mouth every 4 (four) hours as needed.   VITAMIN B-6 PO Take by mouth.       Follow-up Information    [facility] Follow up in 1 week(s).   Why: routine PNC with NST Contact information: [address] [place] [address] [phone]              Signed: Randa Ngo, CNM 07/11/2020  12:20 PM

## 2020-07-11 DIAGNOSIS — Z91040 Latex allergy status: Secondary | ICD-10-CM | POA: Diagnosis not present

## 2020-07-11 DIAGNOSIS — O30043 Twin pregnancy, dichorionic/diamniotic, third trimester: Secondary | ICD-10-CM | POA: Diagnosis not present

## 2020-07-11 DIAGNOSIS — O99891 Other specified diseases and conditions complicating pregnancy: Secondary | ICD-10-CM | POA: Diagnosis not present

## 2020-07-11 DIAGNOSIS — Z3A31 31 weeks gestation of pregnancy: Secondary | ICD-10-CM | POA: Diagnosis not present

## 2020-07-11 DIAGNOSIS — M545 Low back pain: Secondary | ICD-10-CM | POA: Diagnosis not present

## 2020-07-11 LAB — URINE CULTURE: Culture: <10000 — AB

## 2020-07-11 MED ORDER — CYCLOBENZAPRINE HCL 10 MG PO TABS: 10.0000 mg | ORAL_TABLET | Freq: Three times a day (TID) | ORAL | 0 refills | Status: DC | PRN

## 2020-07-11 MED ORDER — NIFEDIPINE 10 MG PO CAPS: 10.0000 mg | ORAL_CAPSULE | Freq: Four times a day (QID) | ORAL | 0 refills | Status: DC | PRN

## 2020-07-11 MED ORDER — CYCLOBENZAPRINE HCL 10 MG PO TABS
10.0000 mg | ORAL_TABLET | Freq: Three times a day (TID) | ORAL | Status: DC | PRN
  Administered 2020-07-11: 10 mg via ORAL
  Filled 2020-07-11 (×3): qty 1

## 2020-07-11 MED ORDER — NIFEDIPINE 10 MG PO CAPS
10.0000 mg | ORAL_CAPSULE | Freq: Four times a day (QID) | ORAL | Status: DC | PRN
  Administered 2020-07-11: 10 mg via ORAL
  Filled 2020-07-11: qty 1

## 2020-07-11 MED ORDER — ACETAMINOPHEN 500 MG PO TABS: 1000.0000 mg | ORAL_TABLET | Freq: Four times a day (QID) | ORAL | 0 refills | Status: AC | PRN

## 2020-07-11 MED ORDER — LAMOTRIGINE 25 MG PO TABS: 125.0000 mg | ORAL_TABLET | Freq: Every day | ORAL | 0 refills | Status: DC

## 2020-07-11 NOTE — OB Triage Note (Signed)
Pt. discharged home in stable condition with significant other. Pt. reports a decrease in contractions and a relief of back pain. Pt. Was given discharge instructions and instructions on stretches from CNM. Medication instructions given. Pt verbalized understanding of plan.

## 2020-07-16 DIAGNOSIS — R42 Dizziness and giddiness: Secondary | ICD-10-CM | POA: Diagnosis not present

## 2020-07-16 DIAGNOSIS — M9901 Segmental and somatic dysfunction of cervical region: Secondary | ICD-10-CM | POA: Diagnosis not present

## 2020-07-16 DIAGNOSIS — M9900 Segmental and somatic dysfunction of head region: Secondary | ICD-10-CM | POA: Diagnosis not present

## 2020-07-16 DIAGNOSIS — M530 Cervicocranial syndrome: Secondary | ICD-10-CM | POA: Diagnosis not present

## 2020-07-17 ENCOUNTER — Ambulatory Visit: Payer: Medicaid Other | Admitting: Licensed Clinical Social Worker

## 2020-07-17 NOTE — Progress Notes (Unsigned)
Counselor/Therapist Progress Note  [name], MRN: [id number],    Date: 07/17/2020  Time Spent: ***   Treatment Type: Individual Therapy  Reported Symptoms: {CHL AMB Reported Symptoms:[id number]}  Mental Status Exam:  Appearance:   {PSY:22683}     Behavior:  {PSY:21022743}  Motor:  {PSY:22302}  Speech/Language:   {PSY:22685}  Affect:  {PSY:22687}  Mood:  {PSY:31886}  Thought process:  {PSY:31888}  Thought content:    {PSY:[id number]}  Sensory/Perceptual disturbances:    {PSY:417-202-6539}  Orientation:  {PSY:30297}  Attention:  {PSY:22877}  Concentration:  {PSY:506-660-5822}  Memory:  {[phone]}  Fund of knowledge:   {PSY:506-660-5822}  Insight:    {PSY:506-660-5822}  Judgment:   {PSY:506-660-5822}  Impulse Control:  {PSY:506-660-5822}   Risk Assessment: Danger to Self:  {PSY:22692} Self-injurious Behavior: {PSY:22692} Danger to Others: {PSY:22692} Duty to Warn:{PSY:311194} Physical Aggression / Violence:{PSY:21197} Access to Firearms a concern: {PSY:21197} Gang Involvement:{PSY:21197}  Subjective: Patient was engaged and cooperative throughout the session using time effectively to discuss   Patient voices continued motivation for treatment and understanding of  . Patient is likely to benefit from future treatment because  remains motivated to decrease  And   and reports benefit of regular sessions in addressing these symptoms.   Interventions: Cognitive Behavioral Therapy Established psychological safety.   Diagnosis:   ICD-10-CM   1. Mild bipolar II disorder, most recent episode major depressive (HCC)  F31.81   2. Generalized anxiety disorder  F41.1     Plan: Patient's goal NW:GNFA with anxiety and working through things.She reports benefit from having someone to listen to her.  Treatment Target: Understand the relationship between thoughts, emotions, and behaviors   Psychoeducation on CBT model   Teach the connection between thoughts,  emotions, and behaviors   Treatment Target: Increase realistic balanced thinking   Explore patient's thoughts, beliefs, automatic thoughts, assumptions   Identify unhelpful thinking patterns   Process distress and allow for emotional release   Questioning and challenging thoughts  Cognitive reappraisal   Treatment Target: Increase coping skills  Mindfulness practices   Deep breathing   Teach distress tolerance techniques -"what helps me"  STOP technique  Treatment Target: Improve relationship satisfaction with spouse   Communication techniques  Boundary setting   Relationship values exploration  Treatment Target 1: Increase knowledge of child development and positive parenting practices and increase parenting confidence  Objective 1: Through use of Triple P level 4 and/or Stepping Stones, increase parenting confidence and build awareness of child development, and increase parenting skills  Provide psychoeducation on Triple P positive parenting   Explore current parenting strategies   Parenting coaching for developing positive relationship with patient, encouraging desirable behavior, developing clear boundaries and behavioral expectations, providing effective commands and managing misbehavior.   Provide opportunity to explore interventions from Triple P that can support patient in effective parenting practices  Teach behavior management techniques from Triple P to reduce negative behaviors, including consistency, age-appropriate expectations, prompting positive behaviors, using praise and rewards, using clear instructions, and natural consequences to help with listening.  Explore barriers to effective parenting and problem solve  Future Appointments  Date Time Provider Department Center  07/17/2020  2:00 PM Kathreen Cosier, LCSW [facility] None   Interpreter used: NA   Kathreen Cosier, LCSW

## 2020-07-19 DIAGNOSIS — O09893 Supervision of other high risk pregnancies, third trimester: Secondary | ICD-10-CM | POA: Diagnosis not present

## 2020-07-19 DIAGNOSIS — Z419 Encounter for procedure for purposes other than remedying health state, unspecified: Secondary | ICD-10-CM | POA: Diagnosis not present

## 2020-07-19 DIAGNOSIS — O30043 Twin pregnancy, dichorionic/diamniotic, third trimester: Secondary | ICD-10-CM | POA: Diagnosis not present

## 2020-07-19 DIAGNOSIS — O210 Mild hyperemesis gravidarum: Secondary | ICD-10-CM | POA: Diagnosis not present

## 2020-07-19 DIAGNOSIS — Z8659 Personal history of other mental and behavioral disorders: Secondary | ICD-10-CM | POA: Diagnosis not present

## 2020-07-19 DIAGNOSIS — R7309 Other abnormal glucose: Secondary | ICD-10-CM | POA: Diagnosis not present

## 2020-07-22 DIAGNOSIS — M5489 Other dorsalgia: Secondary | ICD-10-CM | POA: Diagnosis not present

## 2020-07-26 DIAGNOSIS — O09893 Supervision of other high risk pregnancies, third trimester: Secondary | ICD-10-CM | POA: Diagnosis not present

## 2020-07-26 DIAGNOSIS — R7309 Other abnormal glucose: Secondary | ICD-10-CM | POA: Diagnosis not present

## 2020-07-26 DIAGNOSIS — O30043 Twin pregnancy, dichorionic/diamniotic, third trimester: Secondary | ICD-10-CM | POA: Diagnosis not present

## 2020-07-29 DIAGNOSIS — M530 Cervicocranial syndrome: Secondary | ICD-10-CM | POA: Diagnosis not present

## 2020-07-29 DIAGNOSIS — R42 Dizziness and giddiness: Secondary | ICD-10-CM | POA: Diagnosis not present

## 2020-07-29 DIAGNOSIS — M9900 Segmental and somatic dysfunction of head region: Secondary | ICD-10-CM | POA: Diagnosis not present

## 2020-07-29 DIAGNOSIS — M9901 Segmental and somatic dysfunction of cervical region: Secondary | ICD-10-CM | POA: Diagnosis not present

## 2020-07-31 DIAGNOSIS — O30043 Twin pregnancy, dichorionic/diamniotic, third trimester: Secondary | ICD-10-CM | POA: Diagnosis not present

## 2020-07-31 DIAGNOSIS — O09893 Supervision of other high risk pregnancies, third trimester: Secondary | ICD-10-CM | POA: Diagnosis not present

## 2020-08-02 DIAGNOSIS — K649 Unspecified hemorrhoids: Secondary | ICD-10-CM | POA: Diagnosis not present

## 2020-08-02 DIAGNOSIS — O30043 Twin pregnancy, dichorionic/diamniotic, third trimester: Secondary | ICD-10-CM | POA: Diagnosis not present

## 2020-08-02 DIAGNOSIS — O2243 Hemorrhoids in pregnancy, third trimester: Secondary | ICD-10-CM | POA: Diagnosis not present

## 2020-08-02 DIAGNOSIS — O09893 Supervision of other high risk pregnancies, third trimester: Secondary | ICD-10-CM | POA: Diagnosis not present

## 2020-08-07 DIAGNOSIS — O09893 Supervision of other high risk pregnancies, third trimester: Secondary | ICD-10-CM | POA: Diagnosis not present

## 2020-08-07 DIAGNOSIS — Z113 Encounter for screening for infections with a predominantly sexual mode of transmission: Secondary | ICD-10-CM | POA: Diagnosis not present

## 2020-08-07 DIAGNOSIS — O30043 Twin pregnancy, dichorionic/diamniotic, third trimester: Secondary | ICD-10-CM | POA: Diagnosis not present

## 2020-08-07 DIAGNOSIS — K219 Gastro-esophageal reflux disease without esophagitis: Secondary | ICD-10-CM | POA: Diagnosis not present

## 2020-08-12 DIAGNOSIS — M530 Cervicocranial syndrome: Secondary | ICD-10-CM | POA: Diagnosis not present

## 2020-08-12 DIAGNOSIS — R42 Dizziness and giddiness: Secondary | ICD-10-CM | POA: Diagnosis not present

## 2020-08-12 DIAGNOSIS — M9901 Segmental and somatic dysfunction of cervical region: Secondary | ICD-10-CM | POA: Diagnosis not present

## 2020-08-12 DIAGNOSIS — M9900 Segmental and somatic dysfunction of head region: Secondary | ICD-10-CM | POA: Diagnosis not present

## 2020-08-13 ENCOUNTER — Other Ambulatory Visit: Payer: Self-pay | Admitting: Obstetrics and Gynecology

## 2020-08-13 DIAGNOSIS — O9952 Diseases of the respiratory system complicating childbirth: Secondary | ICD-10-CM | POA: Diagnosis present

## 2020-08-13 DIAGNOSIS — Z23 Encounter for immunization: Secondary | ICD-10-CM

## 2020-08-13 DIAGNOSIS — Z3A36 36 weeks gestation of pregnancy: Secondary | ICD-10-CM

## 2020-08-13 DIAGNOSIS — J4540 Moderate persistent asthma, uncomplicated: Secondary | ICD-10-CM | POA: Diagnosis present

## 2020-08-13 DIAGNOSIS — O99214 Obesity complicating childbirth: Secondary | ICD-10-CM | POA: Diagnosis present

## 2020-08-13 DIAGNOSIS — Z87891 Personal history of nicotine dependence: Secondary | ICD-10-CM

## 2020-08-13 DIAGNOSIS — O30043 Twin pregnancy, dichorionic/diamniotic, third trimester: Secondary | ICD-10-CM | POA: Diagnosis not present

## 2020-08-13 DIAGNOSIS — Z20822 Contact with and (suspected) exposure to covid-19: Secondary | ICD-10-CM | POA: Diagnosis present

## 2020-08-13 DIAGNOSIS — O09893 Supervision of other high risk pregnancies, third trimester: Secondary | ICD-10-CM | POA: Diagnosis not present

## 2020-08-13 DIAGNOSIS — F419 Anxiety disorder, unspecified: Secondary | ICD-10-CM | POA: Diagnosis present

## 2020-08-13 DIAGNOSIS — O99344 Other mental disorders complicating childbirth: Secondary | ICD-10-CM | POA: Diagnosis present

## 2020-08-13 DIAGNOSIS — O1404 Mild to moderate pre-eclampsia, complicating childbirth: Principal | ICD-10-CM | POA: Diagnosis present

## 2020-08-13 DIAGNOSIS — F32A Depression, unspecified: Secondary | ICD-10-CM | POA: Diagnosis present

## 2020-08-13 NOTE — Progress Notes (Signed)
Dating: EDD: 09/05/20  by LMP: 11/27/19 and c/w [place] at 6+0 wks.   Preg c/b: 1. Di/di twin Pregnancy, conceived with Clomid  Started on baby ASA  03/21/20  05/10/20 at [redacted]w[redacted]d Twin A EFW 37th%ile, Twin B EFW 40th%ile - Discordance = 2.6%  06/05/2020 at [redacted]w[redacted]d Twin A EFW 51th%ile, Twin B EFW 57th%ile - Discordance = 3.9%  07/03/20 at [redacted]w[redacted]d Twin A EFW: 3lb8oz (1586g) = 38%, Twin B EFW: 3lb8oz (1590g) = 39%, Discordance = 0.3%  Weekly NST/AFIs starting at 32 weeks  Requested IOL @ 37weeks. Set up for 08/15/20 @ 00:01 CW 2. Hyperemesis:  Phenergan taking 25mg  tab q4hrs- helps some.   Tried Reglan QID x 3 d, Scopol x 2 d, Zofran- 10d, B6 and Unisom attempted.  3. Prepregnancy BMI 37  CMP, P/C ratio, A1c, and early 1h OGTT ordered with new OB labs 02/08/2020  Early 1 hour 145. 3 hour Ordered for pt 4. History of anxiety, bipolar, depression  EDPS at new ob visit 10  Currently taking lamictal 100mg  daily  Sees Dr. 02/10/2020 in [place] - unable to go because she owes money to them  02/12/20 seen for increased anxiety and stress, c/w [place] who suggested increasing Lamictal to 125mg , starting 1mg  of Folic acid, and she will send a list of counselors for [place].  Pt is now seeing a counselor named 02/14/20 at health department per (03/27/20)  [employer] Every trimester  07/03/20 Reports she saw Dr. Bobette Mo about a month ago and was started on a plan to increase her Lamictal slowly. Will start next week - 125mg  tablets, then 150mg  the next week and 175mg  the next week  4. Varicella NON Immune  Will need after delivery 5. GDMA1: elevated 1hr GTT, did not complete 3hr GTT in 3rd tri Inconsistent logging of CBG, overall well controlled.   02/12/20 Early 1 hr GTT 145  03/25/20 3 hr GTT 92, 168, 126, 86 (WNL)  06/19/20 1 hr GTT 139, No 3 hr GTT  Prenatal Labs: Blood type/Rh  A Pos  Antibody screen neg  Rubella Immune  Varicella  NON-Immune  RPR NR  HBsAg Neg  HIV NR  GC neg  Chlamydia  neg  Genetic screening negative  1 hour GTT Early 145, 28wks 139  3 hour GTT  early  92, 168, 126, 86   GBS negative   Contraception: desires non-hormonal method Tdap: 06/19/20 Flu: 07/03/20 Feeding: breast

## 2020-08-14 ENCOUNTER — Inpatient Hospital Stay: Payer: BC Managed Care – PPO | Admitting: Anesthesiology

## 2020-08-14 ENCOUNTER — Inpatient Hospital Stay
Admission: EM | Admit: 2020-08-14 | Discharge: 2020-08-16 | DRG: 807 | Disposition: A | Discharge status: Home / Self Care | Payer: BC Managed Care – PPO | Attending: Obstetrics and Gynecology | Admitting: Obstetrics and Gynecology

## 2020-08-14 ENCOUNTER — Encounter
Admission: EM | Disposition: A | Discharge status: Home / Self Care | Payer: Self-pay | Source: Home / Self Care | Attending: Obstetrics and Gynecology

## 2020-08-14 ENCOUNTER — Other Ambulatory Visit: Payer: Self-pay

## 2020-08-14 ENCOUNTER — Encounter: Payer: Self-pay | Admitting: Obstetrics & Gynecology

## 2020-08-14 DIAGNOSIS — O1490 Unspecified pre-eclampsia, unspecified trimester: Secondary | ICD-10-CM | POA: Diagnosis present

## 2020-08-14 DIAGNOSIS — O9952 Diseases of the respiratory system complicating childbirth: Secondary | ICD-10-CM | POA: Diagnosis not present

## 2020-08-14 DIAGNOSIS — O30043 Twin pregnancy, dichorionic/diamniotic, third trimester: Secondary | ICD-10-CM | POA: Diagnosis not present

## 2020-08-14 DIAGNOSIS — O30009 Twin pregnancy, unspecified number of placenta and unspecified number of amniotic sacs, unspecified trimester: Secondary | ICD-10-CM | POA: Diagnosis not present

## 2020-08-14 DIAGNOSIS — Z20822 Contact with and (suspected) exposure to covid-19: Secondary | ICD-10-CM | POA: Diagnosis not present

## 2020-08-14 DIAGNOSIS — F32A Depression, unspecified: Secondary | ICD-10-CM | POA: Diagnosis not present

## 2020-08-14 DIAGNOSIS — O99344 Other mental disorders complicating childbirth: Secondary | ICD-10-CM | POA: Diagnosis not present

## 2020-08-14 DIAGNOSIS — Z87891 Personal history of nicotine dependence: Secondary | ICD-10-CM | POA: Diagnosis not present

## 2020-08-14 DIAGNOSIS — Z3A36 36 weeks gestation of pregnancy: Secondary | ICD-10-CM | POA: Diagnosis not present

## 2020-08-14 DIAGNOSIS — Z23 Encounter for immunization: Secondary | ICD-10-CM | POA: Diagnosis not present

## 2020-08-14 DIAGNOSIS — O1404 Mild to moderate pre-eclampsia, complicating childbirth: Secondary | ICD-10-CM | POA: Diagnosis not present

## 2020-08-14 DIAGNOSIS — J4540 Moderate persistent asthma, uncomplicated: Secondary | ICD-10-CM | POA: Diagnosis not present

## 2020-08-14 DIAGNOSIS — F419 Anxiety disorder, unspecified: Secondary | ICD-10-CM | POA: Diagnosis not present

## 2020-08-14 DIAGNOSIS — O99214 Obesity complicating childbirth: Secondary | ICD-10-CM | POA: Diagnosis not present

## 2020-08-14 LAB — COMPREHENSIVE METABOLIC PANEL
ALT: 12 U/L (ref 0–44)
AST: 16 U/L (ref 15–41)
Albumin: 2.8 g/dL — ABNORMAL LOW (ref 3.5–5.0)
Alkaline Phosphatase: 88 U/L (ref 38–126)
Anion gap: 10 (ref 5–15)
BUN: 12 mg/dL (ref 6–20)
CO2: 21 mmol/L — ABNORMAL LOW (ref 22–32)
Calcium: 9.6 mg/dL (ref 8.9–10.3)
Chloride: 102 mmol/L (ref 98–111)
Creatinine, Ser: 0.46 mg/dL (ref 0.44–1.00)
GFR, Estimated: >60 mL/min (ref >60)
Glucose, Bld: 79 mg/dL (ref 70–99)
Potassium: 4 mmol/L (ref 3.5–5.1)
Sodium: 133 mmol/L — ABNORMAL LOW (ref 135–145)
Total Bilirubin: 0.6 mg/dL (ref 0.3–1.2)
Total Protein: 7 g/dL (ref 6.5–8.1)

## 2020-08-14 LAB — PROTEIN / CREATININE RATIO, URINE
Creatinine, Urine: 112 mg/dL
Protein Creatinine Ratio: 0.74 mg/mg{Cre} — ABNORMAL HIGH (ref 0.00–0.15)
Total Protein, Urine: 83 mg/dL

## 2020-08-14 LAB — CBC
HCT: 38.9 % (ref 36.0–46.0)
Hemoglobin: 12.7 g/dL (ref 12.0–15.0)
MCH: 27 pg (ref 26.0–34.0)
MCHC: 32.6 g/dL (ref 30.0–36.0)
MCV: 82.6 fL (ref 80.0–100.0)
Platelets: 264 10*3/uL (ref 150–400)
RBC: 4.71 MIL/uL (ref 3.87–5.11)
RDW: 17 % — ABNORMAL HIGH (ref 11.5–15.5)
WBC: 9.6 10*3/uL (ref 4.0–10.5)
nRBC: 0 % (ref 0.0–0.2)

## 2020-08-14 LAB — RESPIRATORY PANEL BY RT PCR (FLU A&B, COVID)
Influenza A by PCR: NEGATIVE
Influenza B by PCR: NEGATIVE
SARS Coronavirus 2 by RT PCR: NEGATIVE

## 2020-08-14 LAB — RPR: RPR Ser Ql: NONREACTIVE

## 2020-08-14 LAB — TYPE AND SCREEN
ABO/RH(D): A POS
Antibody Screen: NEGATIVE

## 2020-08-14 SURGERY — Surgical Case
Anesthesia: Epidural

## 2020-08-14 MED ORDER — FENTANYL 2.5 MCG/ML W/ROPIVACAINE 0.15% IN NS 100 ML EPIDURAL (ARMC)
EPIDURAL | Status: AC
  Filled 2020-08-14: qty 100

## 2020-08-14 MED ORDER — OXYTOCIN 10 UNIT/ML IJ SOLN
INTRAMUSCULAR | Status: AC
  Filled 2020-08-14: qty 2

## 2020-08-14 MED ORDER — PRENATAL MULTIVITAMIN CH
1.0000 | ORAL_TABLET | Freq: Every day | ORAL | Status: DC
  Administered 2020-08-15 – 2020-08-16 (×2): 1 via ORAL
  Filled 2020-08-14 (×2): qty 1

## 2020-08-14 MED ORDER — LAMOTRIGINE 25 MG PO TABS
150.0000 mg | ORAL_TABLET | Freq: Every day | ORAL | Status: DC
  Administered 2020-08-14 – 2020-08-15 (×2): 150 mg via ORAL
  Filled 2020-08-14 (×4): qty 2

## 2020-08-14 MED ORDER — METHYLERGONOVINE MALEATE 0.2 MG/ML IJ SOLN
INTRAMUSCULAR | Status: AC
  Filled 2020-08-14: qty 1

## 2020-08-14 MED ORDER — BUSPIRONE HCL 10 MG PO TABS
10.0000 mg | ORAL_TABLET | Freq: Every day | ORAL | Status: DC
  Administered 2020-08-14 – 2020-08-15 (×2): 10 mg via ORAL
  Filled 2020-08-14 (×4): qty 1

## 2020-08-14 MED ORDER — ACETAMINOPHEN 325 MG PO TABS: 650.0000 mg | ORAL_TABLET | ORAL | Status: DC | PRN

## 2020-08-14 MED ORDER — OXYCODONE HCL 5 MG PO TABS
10.0000 mg | ORAL_TABLET | ORAL | Status: DC | PRN
  Administered 2020-08-16: 10 mg via ORAL

## 2020-08-14 MED ORDER — CARBOPROST TROMETHAMINE 250 MCG/ML IM SOLN
INTRAMUSCULAR | Status: AC
  Filled 2020-08-14: qty 1

## 2020-08-14 MED ORDER — TRANEXAMIC ACID 1000 MG/10ML IV SOLN
INTRAVENOUS | Status: AC
  Filled 2020-08-14: qty 10

## 2020-08-14 MED ORDER — BUPIVACAINE HCL (PF) 0.5 % IJ SOLN
INTRAMUSCULAR | Status: AC
  Filled 2020-08-14: qty 30

## 2020-08-14 MED ORDER — LIDOCAINE HCL (PF) 1 % IJ SOLN
30.0000 mL | INTRAMUSCULAR | Status: DC | PRN
  Filled 2020-08-14: qty 30

## 2020-08-14 MED ORDER — IBUPROFEN 600 MG PO TABS
600.0000 mg | ORAL_TABLET | Freq: Four times a day (QID) | ORAL | Status: DC
  Administered 2020-08-14: 600 mg via ORAL
  Filled 2020-08-14: qty 1

## 2020-08-14 MED ORDER — OXYTOCIN BOLUS FROM INFUSION
333.0000 mL | Freq: Once | INTRAVENOUS | Status: DC
  Administered 2020-08-14: 333 mL via INTRAVENOUS

## 2020-08-14 MED ORDER — IBUPROFEN 600 MG PO TABS
600.0000 mg | ORAL_TABLET | Freq: Four times a day (QID) | ORAL | Status: DC
  Administered 2020-08-14 – 2020-08-16 (×8): 600 mg via ORAL
  Filled 2020-08-14 (×8): qty 1

## 2020-08-14 MED ORDER — EPHEDRINE 5 MG/ML INJ
10.0000 mg | INTRAVENOUS | Status: DC | PRN
  Administered 2020-08-14: 10 mg via INTRAVENOUS

## 2020-08-14 MED ORDER — TETANUS-DIPHTH-ACELL PERTUSSIS 5-2.5-18.5 LF-MCG/0.5 IM SUSY: 0.5000 mL | PREFILLED_SYRINGE | Freq: Once | INTRAMUSCULAR | Status: DC

## 2020-08-14 MED ORDER — TERBUTALINE SULFATE 1 MG/ML IJ SOLN: 0.2500 mg | Freq: Once | INTRAMUSCULAR | Status: DC | PRN

## 2020-08-14 MED ORDER — LACTATED RINGERS IV SOLN
500.0000 mL | Freq: Once | INTRAVENOUS | Status: AC
  Administered 2020-08-14: 500 mL via INTRAVENOUS

## 2020-08-14 MED ORDER — BUPIVACAINE HCL (PF) 0.25 % IJ SOLN
INTRAMUSCULAR | Status: DC | PRN
  Administered 2020-08-14: 4 mL via EPIDURAL

## 2020-08-14 MED ORDER — LIDOCAINE HCL (PF) 1 % IJ SOLN
INTRAMUSCULAR | Status: DC | PRN
  Administered 2020-08-14: 2 mL

## 2020-08-14 MED ORDER — FENTANYL CITRATE (PF) 100 MCG/2ML IJ SOLN
50.0000 ug | INTRAMUSCULAR | Status: DC | PRN
  Administered 2020-08-14 (×2): 50 ug via INTRAVENOUS
  Filled 2020-08-14 (×2): qty 2

## 2020-08-14 MED ORDER — BUPIVACAINE LIPOSOME 1.3 % IJ SUSP
INTRAMUSCULAR | Status: AC
  Filled 2020-08-14: qty 20

## 2020-08-14 MED ORDER — DOCUSATE SODIUM 100 MG PO CAPS
100.0000 mg | ORAL_CAPSULE | Freq: Two times a day (BID) | ORAL | Status: DC
  Administered 2020-08-14 – 2020-08-15 (×2): 100 mg via ORAL
  Filled 2020-08-14 (×2): qty 1

## 2020-08-14 MED ORDER — OXYTOCIN-SODIUM CHLORIDE 30-0.9 UT/500ML-% IV SOLN
1.0000 m[IU]/min | INTRAVENOUS | Status: DC
  Administered 2020-08-14: 2 m[IU]/min via INTRAVENOUS
  Filled 2020-08-14 (×2): qty 500

## 2020-08-14 MED ORDER — ONDANSETRON HCL 4 MG/2ML IJ SOLN
4.0000 mg | Freq: Four times a day (QID) | INTRAMUSCULAR | Status: DC | PRN
  Administered 2020-08-14 (×2): 4 mg via INTRAVENOUS
  Filled 2020-08-14 (×2): qty 2

## 2020-08-14 MED ORDER — ONDANSETRON HCL 4 MG/2ML IJ SOLN
4.0000 mg | Freq: Four times a day (QID) | INTRAMUSCULAR | Status: DC
  Filled 2020-08-14: qty 2

## 2020-08-14 MED ORDER — DIPHENHYDRAMINE HCL 25 MG PO CAPS: 25.0000 mg | ORAL_CAPSULE | Freq: Four times a day (QID) | ORAL | Status: DC | PRN

## 2020-08-14 MED ORDER — FENTANYL 2.5 MCG/ML W/ROPIVACAINE 0.15% IN NS 100 ML EPIDURAL (ARMC)
12.0000 mL/h | EPIDURAL | Status: DC
  Administered 2020-08-14: 12 mL/h via EPIDURAL

## 2020-08-14 MED ORDER — SOD CITRATE-CITRIC ACID 500-334 MG/5ML PO SOLN
30.0000 mL | ORAL | Status: DC | PRN
  Filled 2020-08-14: qty 15

## 2020-08-14 MED ORDER — ONDANSETRON HCL 4 MG/2ML IJ SOLN: 4.0000 mg | INTRAMUSCULAR | Status: DC | PRN

## 2020-08-14 MED ORDER — PROPOFOL 10 MG/ML IV BOLUS
INTRAVENOUS | Status: AC
  Filled 2020-08-14: qty 20

## 2020-08-14 MED ORDER — LIDOCAINE-EPINEPHRINE (PF) 1.5 %-1:200000 IJ SOLN
INTRAMUSCULAR | Status: DC | PRN
  Administered 2020-08-14: 4 mL via PERINEURAL

## 2020-08-14 MED ORDER — PHENYLEPHRINE 40 MCG/ML (10ML) SYRINGE FOR IV PUSH (FOR BLOOD PRESSURE SUPPORT): 80.0000 ug | PREFILLED_SYRINGE | INTRAVENOUS | Status: DC | PRN

## 2020-08-14 MED ORDER — SIMETHICONE 80 MG PO CHEW
80.0000 mg | CHEWABLE_TABLET | ORAL | Status: DC | PRN
  Administered 2020-08-15: 80 mg via ORAL
  Filled 2020-08-14: qty 1

## 2020-08-14 MED ORDER — WITCH HAZEL-GLYCERIN EX PADS
1.0000 "application " | MEDICATED_PAD | CUTANEOUS | Status: DC | PRN
  Administered 2020-08-15: 1 via TOPICAL
  Filled 2020-08-14: qty 100

## 2020-08-14 MED ORDER — EPHEDRINE 5 MG/ML INJ
10.0000 mg | INTRAVENOUS | Status: DC | PRN
  Filled 2020-08-14: qty 4

## 2020-08-14 MED ORDER — ONDANSETRON HCL 4 MG PO TABS
4.0000 mg | ORAL_TABLET | ORAL | Status: DC | PRN
  Administered 2020-08-16: 4 mg via ORAL
  Filled 2020-08-14: qty 1

## 2020-08-14 MED ORDER — BENZOCAINE-MENTHOL 20-0.5 % EX AERO
1.0000 "application " | INHALATION_SPRAY | CUTANEOUS | Status: DC | PRN
  Filled 2020-08-14: qty 56

## 2020-08-14 MED ORDER — SODIUM CHLORIDE (PF) 0.9 % IJ SOLN
INTRAMUSCULAR | Status: AC
  Filled 2020-08-14: qty 50

## 2020-08-14 MED ORDER — MISOPROSTOL 200 MCG PO TABS
ORAL_TABLET | ORAL | Status: AC
  Filled 2020-08-14: qty 4

## 2020-08-14 MED ORDER — LACTATED RINGERS IV SOLN
500.0000 mL | INTRAVENOUS | Status: DC | PRN
  Administered 2020-08-14: 500 mL via INTRAVENOUS

## 2020-08-14 MED ORDER — AMMONIA AROMATIC IN INHA
RESPIRATORY_TRACT | Status: AC
  Filled 2020-08-14: qty 10

## 2020-08-14 MED ORDER — OXYCODONE HCL 5 MG PO TABS
5.0000 mg | ORAL_TABLET | ORAL | Status: DC | PRN
  Administered 2020-08-15 – 2020-08-16 (×3): 5 mg via ORAL
  Filled 2020-08-14 (×4): qty 1

## 2020-08-14 MED ORDER — COCONUT OIL OIL
1.0000 "application " | TOPICAL_OIL | Status: DC | PRN
  Administered 2020-08-15: 1 via TOPICAL
  Filled 2020-08-14: qty 120

## 2020-08-14 MED ORDER — DIPHENHYDRAMINE HCL 50 MG/ML IJ SOLN: 12.5000 mg | INTRAMUSCULAR | Status: DC | PRN

## 2020-08-14 MED ORDER — DIBUCAINE (PERIANAL) 1 % EX OINT
1.0000 "application " | TOPICAL_OINTMENT | CUTANEOUS | Status: DC | PRN
  Filled 2020-08-14: qty 28

## 2020-08-14 MED ORDER — ONDANSETRON HCL 4 MG/2ML IJ SOLN
4.0000 mg | Freq: Four times a day (QID) | INTRAMUSCULAR | Status: DC
  Administered 2020-08-14: 4 mg via INTRAVENOUS
  Filled 2020-08-14: qty 2

## 2020-08-14 MED ORDER — LACTATED RINGERS IV SOLN: INTRAVENOUS | Status: DC

## 2020-08-14 MED ORDER — ACETAMINOPHEN 325 MG PO TABS
650.0000 mg | ORAL_TABLET | ORAL | Status: DC | PRN
  Administered 2020-08-14 – 2020-08-16 (×8): 650 mg via ORAL
  Filled 2020-08-14 (×8): qty 2

## 2020-08-14 MED ORDER — FERROUS SULFATE 325 (65 FE) MG PO TABS
325.0000 mg | ORAL_TABLET | Freq: Two times a day (BID) | ORAL | Status: DC
  Administered 2020-08-15 – 2020-08-16 (×4): 325 mg via ORAL
  Filled 2020-08-14 (×4): qty 1

## 2020-08-14 MED ORDER — OXYTOCIN-SODIUM CHLORIDE 30-0.9 UT/500ML-% IV SOLN
2.5000 [IU]/h | INTRAVENOUS | Status: DC
  Administered 2020-08-14: 2.5 [IU]/h via INTRAVENOUS
  Filled 2020-08-14: qty 500

## 2020-08-14 SURGICAL SUPPLY — 24 items
BARRIER ADHS 3X4 INTERCEED (GAUZE/BANDAGES/DRESSINGS) ×2 IMPLANT
CANISTER SUCT 3000ML PPV (MISCELLANEOUS) ×2 IMPLANT
CHLORAPREP W/TINT 26 (MISCELLANEOUS) ×2 IMPLANT
COVER WAND RF STERILE (DRAPES) ×2 IMPLANT
DRSG TELFA 3X8 NADH (GAUZE/BANDAGES/DRESSINGS) ×2 IMPLANT
ELECT CAUTERY BLADE 6.4 (BLADE) ×2 IMPLANT
ELECT REM PT RETURN 9FT ADLT (ELECTROSURGICAL) ×2
ELECTRODE REM PT RTRN 9FT ADLT (ELECTROSURGICAL) ×1 IMPLANT
GAUZE SPONGE 4X4 12PLY STRL (GAUZE/BANDAGES/DRESSINGS) ×2 IMPLANT
GLOVE SURG SYN 8.0 (GLOVE) ×2 IMPLANT
GOWN STRL REUS W/ TWL LRG LVL3 (GOWN DISPOSABLE) ×2 IMPLANT
GOWN STRL REUS W/ TWL XL LVL3 (GOWN DISPOSABLE) ×1 IMPLANT
GOWN STRL REUS W/TWL LRG LVL3 (GOWN DISPOSABLE) ×2
GOWN STRL REUS W/TWL XL LVL3 (GOWN DISPOSABLE) ×1
NEEDLE HYPO 22GX1.5 SAFETY (NEEDLE) ×2 IMPLANT
NS IRRIG 1000ML POUR BTL (IV SOLUTION) ×2 IMPLANT
PACK C SECTION AR (MISCELLANEOUS) ×2 IMPLANT
PAD OB MATERNITY 4.3X12.25 (PERSONAL CARE ITEMS) ×2 IMPLANT
PAD PREP 24X41 OB/GYN DISP (PERSONAL CARE ITEMS) ×2 IMPLANT
STRAP SAFETY 5IN WIDE (MISCELLANEOUS) ×2 IMPLANT
SUT CHROMIC 1 CTX 36 (SUTURE) ×6 IMPLANT
SUT PLAIN GUT 0 (SUTURE) ×4 IMPLANT
SUT VIC AB 0 CT1 36 (SUTURE) ×4 IMPLANT
SYR 30ML LL (SYRINGE) ×4 IMPLANT

## 2020-08-14 NOTE — Progress Notes (Signed)
Labor Progress Note  Janet Rich is a 24 y.o. G2P1001 at [redacted]w[redacted]d by LMP admitted for active labor  Subjective: Pt is comfortable with epidural  Objective: BP 114/82   Pulse 82   Temp (!) 97.4 F (36.3 C) (Axillary)   Resp 17   Ht 5\' 1"  (1.549 m)   Wt 105.2 kg   LMP 11/27/2019   SpO2 98%   BMI 43.84 kg/m   Vitals:   08/14/20 0951 08/14/20 1001 08/14/20 1003 08/14/20 1016  BP: 120/73 (!) 134/92  114/82  Pulse: 70 95  82  Temp:   (!) 97.4 F (36.3 C)   Resp:      Height:      Weight:      SpO2: 100% 99%  98%  TempSrc:   Axillary   BMI (Calculated):        Fetal Assessment: (Blue)FHT:  FHR: 135 bpm, variability: moderate,  accelerations:  Present,  decelerations:  Absent Category/reactivity:  Category I   (Yellow)FHT:  FHR: 130 bpm, variability: moderate,  accelerations:  Present,  decelerations:  Absent Category/reactivity:  Category I  UC:   regular, every 9 minutes SVE:    Dilation: 6cm  Effacement: 80%  Station:  -1  Consistency: soft  Position: middle  Membrane status: AROM'd at 1020 Amniotic color: Clear   Labs: Lab Results  Component Value Date   WBC 9.6 08/14/2020   HGB 12.7 08/14/2020   HCT 38.9 08/14/2020   MCV 82.6 08/14/2020   PLT 264 08/14/2020    Assessment / Plan: Spontaneous labor, on Pitocin  Labor: On 63mU of Pitocin, AROM'd baby B's bag Preeclampsia:  Recent BPs noted above, No s/s, will continue to assess. Fetal Wellbeing:  Category I Pain Control:  Epidural I/D:  Afebrile, GBS neg, AROM at 1020 Anticipated MOD:  NSVD  11m, CNM 08/14/2020, 10:18 AM

## 2020-08-14 NOTE — Discharge Summary (Signed)
Obstetrical Discharge Summary  Patient Name: Janet Rich DOB: Dec 09, 1995 MRN: [id number]  Date of Admission: 08/14/2020 Date of Delivery: 08/14/20 Delivered by: Baby A Janet Rich, Baby B Dr. Feliberto Rich Date of Discharge: 08/16/2020  [facility]  IRC:VELFYBO'F last menstrual period was 11/27/2019. EDC Estimated Date of Delivery: 09/05/20 Gestational Age at Delivery: [redacted]w[redacted]d   Antepartum complications:  1. Clomid-assisted pregnancy 2. Di-di twin gestation 3. Persistent hyperemesis 4. Obesity 5. History of anxiety / bipolar / depression 6. Elevated 1hr, did not complete 3hr, but did track sugars for several weeks intermittently and no persistent elevated values  Admitting Diagnosis: Active labor, preeclampsia without severe features  Secondary Diagnosis: Patient Active Problem List   Diagnosis Date Noted  . Dichorionic diamniotic twin pregnancy in third trimester 08/14/2020  . Preeclampsia 08/14/2020  . Twin pregnancy delivered vaginally 08/14/2020  . Preterm labor 06/17/2020  . Right trigeminal neuralgia 11/08/2019  . Endometriosis 03/27/2019  . PCOS (polycystic ovarian syndrome) 03/27/2019  . Moderate persistent asthma 03/07/2019  . Post-nasal drainage 03/07/2019  . Irritable bowel syndrome with diarrhea 03/07/2019  . Gastroesophageal reflux disease without esophagitis 03/07/2019  . Class 2 obesity due to excess calories without serious comorbidity with body mass index (BMI) of 38.0 to 38.9 in adult 09/02/2018  . Bipolar 2 disorder (HCC) 08/12/2018  . History of low birth weight 03/11/2018  . Vulvar atrophy 03/11/2018  . History of migraine headaches 05/01/2015  . Anxiety and depression 05/01/2015    Augmentation: AROM and Pitocin Complications: None  Intrapartum complications/course:  Delivery Type: spontaneous vaginal delivery Anesthesia: epidural Placenta: spontaneous Laceration: 1st degree vaginal and perineal Episiotomy:  none Newborn Data:   Janet, Rich [[id number]]  Live born female  Birth Weight: 4 lb 13.3 oz (2190 g) APGAR: 8, 9  Newborn Delivery   Birth date/time: 08/14/2020 13:28:00 Delivery type: Vaginal, Spontaneous       Janet, Rich [[id number]]  Live born female  Birth Weight: 4 lb 8.7 oz (2060 g) APGAR: 8, 9  Newborn Delivery   Birth date/time: 08/14/2020 13:35:00 Delivery type: Vaginal, Spontaneous      24yo G4P1021 at 39+4wks presenting with active labor, and AROM with clear fluid x2.  She progressed to complete and pushed over an intact perineum and delivered baby A's fetal head, followed promptly by the shoulders.  Baby was left down by the perineum d/t a short cord. Delayed cord clamping and the FOB cut his cord.  Next she delivered baby B's fetal head, followed promptly by the shoulders, and the baby placed on the maternal abdomen. Delayed cord clamping and the FOB cut his cord.  She was in control the whole time. The placentas were delivered together spontaneously and intact.  A small 1st degree laceration was repaired. Mom and babies tolerated the procedure well.   Postpartum Procedures: none  Post partum course:  Patient had an uncomplicated postpartum course.  By time of discharge on PPD#2, her pain was controlled on oral pain medications; she had appropriate lochia and was ambulating, voiding without difficulty and tolerating regular diet.  She was deemed stable for discharge to home.    Discharge Physical Exam:  BP 115/78 (BP Location: Left Arm)   Pulse 75   Temp 98.4 F (36.9 C) (Oral)   Resp 18   Ht 5\' 1"  (1.549 m)   Wt 105.2 kg   LMP 11/27/2019   SpO2 99%   Breastfeeding Unknown   BMI 43.84 kg/m   General: alert and no  distress Pulm: normal respiratory effort Lochia: appropriate Abdomen: soft, NT Uterine Fundus: firm, below umbilicus Perineum: minimal edema, repair well approximated  Extremities: No evidence of DVT seen on physical exam. No  lower extremity edema. Janet Rich Postnatal Depression Scale Screening Tool 08/15/2020 03/11/2018 02/14/2018  I have been able to laugh and see the funny side of things. 0 0 0  I have looked forward with enjoyment to things. 0 0 0  I have blamed myself unnecessarily when things went wrong. 1 2 2   I have been anxious or worried for no good reason. 2 2 2   I have felt scared or panicky for no good reason. 0 0 0  Things have been getting on top of me. 0 2 0  I have been so unhappy that I have had difficulty sleeping. 0 0 0  I have felt sad or miserable. 1 2 1   I have been so unhappy that I have been crying. 0 1 0  The thought of harming myself has occurred to me. 0 0 0  Edinburgh Postnatal Depression Scale Total 4 9 5      Labs: CBC Latest Ref Rng & Units 08/15/2020 08/14/2020 03/01/2020  WBC 4.0 - 10.5 K/uL 11.1(H) 9.6 9.8  Hemoglobin 12.0 - 15.0 g/dL 11.8(L) 12.7 12.4  Hematocrit 36 - 46 % 36.3 38.9 37.5  Platelets 150 - 400 K/uL 220 264 330   A POS Hemoglobin  Date Value Ref Range Status  08/15/2020 11.8 (L) 12.0 - 15.0 g/dL Final  08/17/2020 08/16/2020 11.1 - 15.9 g/dL Final   HCT  Date Value Ref Range Status  08/15/2020 36.3 36 - 46 % Final   Hematocrit  Date Value Ref Range Status  08/25/2018 41.8 34.0 - 46.6 % Final    Disposition: stable, discharge to home Baby Feeding: breastmilk and formula Baby Disposition: home with mom  Contraception: POP's   Prenatal Labs:  Blood type/Rh A pos  Antibody screen neg  Rubella Immune  Varicella NON - Immune  RPR NR  HBsAg Neg  HIV NR  GC neg  Chlamydia neg  Genetic screening negative  1 hour GTT Early: 145, 28wks 139  3 hour GTT early92, 168, 126, 86  28wks not done  GBS negative   Rh Immune globulin given: n/a Rubella vaccine given: n/a Varicella vaccine given: to be given PP Tdap vaccine given in AP or PP setting: Given 06/19/20 Flu vaccine given in AP or PP setting: given 07/03/20  Plan: Janet Rich was  discharged to home in good condition. Follow-up appointment with delivering provider in 6 weeks.  Discharge Instructions: Per After Visit Summary. Activity: Advance as tolerated. Pelvic rest for 6 weeks.   Diet: Regular Discharge Medications: Allergies as of 08/16/2020      Reactions   Amoxicillin Shortness Of Breath   Dizziness, shortness of breath, nausea.   Other    Must have anti-emetic prior to being given any narcotics    Latex Rash      Medication List    STOP taking these medications   NIFEdipine 10 MG capsule Commonly known as: PROCARDIA   promethazine 12.5 MG tablet Commonly known as: PHENERGAN   VITAMIN B-6 PO     TAKE these medications   acetaminophen 500 MG tablet Commonly known as: TYLENOL Take 2 tablets (1,000 mg total) by mouth every 6 (six) hours as needed for moderate pain.   albuterol 108 (90 Base) MCG/ACT inhaler Commonly known as: VENTOLIN HFA Inhale 1-2 puffs into the  lungs every 6 (six) hours as needed for wheezing or shortness of breath.   albuterol (2.5 MG/3ML) 0.083% nebulizer solution Commonly known as: PROVENTIL Take 3 mLs (2.5 mg total) by nebulization every 6 (six) hours as needed for wheezing or shortness of breath.   budesonide-formoterol 80-4.5 MCG/ACT inhaler Commonly known as: SYMBICORT Inhale 2 puffs into the lungs 2 (two) times daily.   busPIRone 10 MG tablet Commonly known as: BUSPAR Take 10 mg by mouth daily.   cyclobenzaprine 10 MG tablet Commonly known as: FLEXERIL Take 1 tablet (10 mg total) by mouth 3 (three) times daily as needed for muscle spasms.   ibuprofen 600 MG tablet Commonly known as: ADVIL Take 1 tablet (600 mg total) by mouth every 6 (six) hours as needed.   lamoTRIgine 25 MG tablet Commonly known as: LAMICTAL Take 5 tablets (125 mg total) by mouth daily. What changed: how much to take   oxyCODONE 5 MG immediate release tablet Commonly known as: Oxy IR/ROXICODONE Take 1 tablet (5 mg total) by mouth  every 4 (four) hours as needed for up to 7 days (pain scale 4-7).   prenatal multivitamin Tabs tablet Take 2 tablets by mouth daily.      Outpatient follow up:   Follow-up Information    [facility]. Schedule an appointment as soon as possible for a visit on 08/19/2020.   Why: for blood pressure check  Contact information: [address] [facility] [place] [address] [phone]       Janet Rich, CNM. Schedule an appointment as soon as possible for a visit in 2 week(s).   Specialty: Certified Nurse Midwife Why: postpartum laceration check  Contact information: [address] [place] [address] [phone]               Signed:  Margaretmary Eddy, CNM Certified Nurse Midwife [facility]

## 2020-08-14 NOTE — Anesthesia Procedure Notes (Signed)
Epidural Patient location during procedure: OB Start time: 08/14/2020 8:54 AM  Staffing Anesthesiologist: Karleen Hampshire, MD Resident/CRNA: Jaye Beagle, CRNA Performed: resident/CRNA   Preanesthetic Checklist Completed: patient identified, IV checked, site marked, risks and benefits discussed, surgical consent, monitors and equipment checked, pre-op evaluation and timeout performed  Epidural Patient position: sitting Prep: ChloraPrep Patient monitoring: heart rate, continuous pulse ox and blood pressure Approach: midline Location: L3-L4 Injection technique: LOR saline  Needle:  Needle type: Tuohy  Needle gauge: 17 G Needle length: 9 cm and 9 Needle insertion depth: 7 cm Catheter type: closed end flexible Catheter size: 19 Gauge Catheter at skin depth: 12 cm Test dose: negative and 1.5% lidocaine with Epi 1:200 K  Assessment Sensory level: T10 Events: blood not aspirated, injection not painful, no injection resistance, no paresthesia and negative IV test  Additional Notes 2 attempt Pt. Evaluated and documentation done after procedure finished. Patient identified. Risks/Benefits/Options discussed with patient including but not limited to bleeding, infection, nerve damage, paralysis, failed block, incomplete pain control, headache, blood pressure changes, nausea, vomiting, reactions to medication both or allergic, itching and postpartum back pain. Confirmed with bedside nurse the patient's most recent platelet count. Confirmed with patient that they are not currently taking any anticoagulation, have any bleeding history or any family history of bleeding disorders. Patient expressed understanding and wished to proceed. All questions were answered. Sterile technique was used throughout the entire procedure. Please see nursing notes for vital signs. Test dose was given through epidural catheter and negative prior to continuing to dose epidural or start infusion. Warning  signs of high block given to the patient including shortness of breath, tingling/numbness in hands, complete motor block, or any concerning symptoms with instructions to call for help. Patient was given instructions on fall risk and not to get out of bed. All questions and concerns addressed with instructions to call with any issues or inadequate analgesia.   Patient tolerated the insertion well without immediate complications.Reason for block:procedure for pain

## 2020-08-14 NOTE — Lactation Note (Signed)
This note was copied from a baby's chart. Lactation Consultation Note  Patient Name: Janet Rich QBHAL'P Date: 08/14/2020 Reason for consult: Initial assessment;Late-preterm 34-36.6wks;Infant < 6lbs;Multiple gestation  Lactation present for first feeding attempt with twin girls born at [redacted]w[redacted]d, both weighing <5lbs, SVD. Mom provided breastmilk for her first child through exclusive pumping and bottle feeding. Mom reports flat nipples with first, but due to pumping her nipples are now erect. She had an oversupply with first child. Twin A fed first, grasped breast easily with assistance from [facility] for placement in football hold on right breast, pillow support, and sandwiching of breast tissue. Baby maintained latch and fed consistently with a few swallows for 10 minutes. Twin A was placed skin to skin with dad. Twin B brought to mom, placed in football hold on right breast as well, LC assisted with positioning, sandwiching, and achieving latch. Twin B did come on/off breast several times, but had rhythmic sucking periods between breaks, with a few noticeable swallows. Twin B also transitioned to cross-cradle hold where she was able to maintain latch better, and fed for a total of 18 minutes. Placed skin to skin on mom's chest. LC hand expressed primarily from mom's left breast as neither infant fed from that side, with hand expression reviewed; over 23mL obtained in hand expression, available PRN for infants. BS to be checked per protocol, Transition with update LC as needed.  Maternal Data Formula Feeding for Exclusion: No Has patient been taught Hand Expression?: Yes Does the patient have breastfeeding experience prior to this delivery?: Yes  Feeding Feeding Type: Breast Fed  LATCH Score Latch: Grasps breast easily, tongue down, lips flanged, rhythmical sucking.  Audible Swallowing: A few with stimulation  Type of Nipple: Everted at rest and after stimulation  Comfort (Breast/Nipple):  Soft / non-tender  Hold (Positioning): Assistance needed to correctly position infant at breast and maintain latch.  LATCH Score: 8  Interventions Interventions: Breast feeding basics reviewed;Assisted with latch;Hand express;Breast compression;Support pillows;Position options  Lactation Tools Discussed/Used     Consult Status Consult Status: Follow-up Date: 08/14/20 Follow-up type: In-patient    Danford Bad 08/14/2020, 3:08 PM

## 2020-08-14 NOTE — Transfer of Care (Signed)
Immediate Anesthesia Transfer of Care Note  Patient: Janet Rich  Procedure(s) Performed: VAGINAL DELIVERY (N/A )  Patient Location: PACU and Mother/Baby  Anesthesia Type:Epidural  Level of Consciousness: awake, alert  and oriented  Airway & Oxygen Therapy: Patient Spontanous Breathing  Post-op Assessment: Report given to RN and Post -op Vital signs reviewed and stable  Post vital signs: Reviewed and stable  Last Vitals:  Vitals Value Taken Time  BP    Temp    Pulse    Resp    SpO2      Last Pain:  Vitals:   08/14/20 1201  TempSrc: Oral  PainSc:          Complications: No complications documented.

## 2020-08-14 NOTE — Progress Notes (Signed)
Per provider Ward MD, "Janet Rich" assigned from [place] is now engaged in pelvis and will be first to deliver. Initial Janet monitoring being assessed on "Janet Rich" is actually "Janet Rich" and the assessments on "Janet Rich" is on "Janet Rich".

## 2020-08-14 NOTE — Anesthesia Preprocedure Evaluation (Signed)
Anesthesia Evaluation  Patient identified by MRN, date of birth, ID band Patient awake    Reviewed: Allergy & Precautions, H&P , NPO status , Patient's Chart, lab work & pertinent test results  History of Anesthesia Complications (+) PONV and history of anesthetic complications  Airway Mallampati: II  TM Distance: <3 FB Neck ROM: full  Mouth opening: Limited Mouth Opening  Dental  (+) Teeth Intact   Pulmonary asthma , resolved, former smoker,    Pulmonary exam normal        Cardiovascular Exercise Tolerance: Good hypertension, Normal cardiovascular exam     Neuro/Psych  Headaches, Anxiety Depression Bipolar Disorder  Neuromuscular disease    GI/Hepatic GERD  Controlled,  Endo/Other  negative endocrine ROS  Renal/GU negative Renal ROS  negative genitourinary   Musculoskeletal   Abdominal   Peds  Hematology  (+) Blood dyscrasia, anemia ,   Anesthesia Other Findings   Reproductive/Obstetrics (+) Pregnancy                             Anesthesia Physical Anesthesia Plan  ASA: III  Anesthesia Plan: Epidural   Post-op Pain Management:    Induction:   PONV Risk Score and Plan:   Airway Management Planned:   Additional Equipment:   Intra-op Plan:   Post-operative Plan:   Informed Consent:     Dental Advisory Given  Plan Discussed with: Anesthesiologist  Anesthesia Plan Comments:         Anesthesia Quick Evaluation

## 2020-08-14 NOTE — H&P (Signed)
OB History & Physical   History of Present Illness:  Chief Complaint:   HPI:  Janet Rich is a 24 y.o. G2P1001 female at [redacted]w[redacted]d dated by LMP, confirmed by [redacted]w[redacted]d ultrasound. Estimated Date of Delivery: 09/05/20 Patient's last menstrual period was 11/27/2019.    She presents to L&D with persistent painful and worsening contractions and back pain.  She was seen in the office today and was 4cm on cervical exam.   +FM x2, + CTX, no LOF, +VB: light spotting Denies: HA, visual changes, SOB, or RUQ/epigastric pain   Pregnancy Issues: 1. Clomid-assisted pregnancy 2. Di-di twin gestation  <1% discordance 3. Persistent hyperemesis 4. Obesity  Prepregnancy BMI 37 5. History of anxiety / bipolar / depression  Lamictal 150mg  daily  6. Elevated 1hr, did not complete 3hr, but did track sugars for several weeks intermittently and no persistent elevated values   Maternal Medical History:   Past Medical History:  Diagnosis Date  . Anemia    with periods  . Ankle fracture    right  . Anxiety   . Asthma   . Back pain   . Depression   . Endometriosis   . GERD (gastroesophageal reflux disease)   . Headache   . Ovarian cyst   . PCOS (polycystic ovarian syndrome)   . Pneumonia    age 26  . PONV (postoperative nausea and vomiting)    nausea after wisdom teeth extraction  . Vaginal Pap smear, abnormal    bx ok    Past Surgical History:  Procedure Laterality Date  . FRACTURE SURGERY     ankle surgery with impant   . HYSTEROSCOPY WITH D & C N/A 09/22/2019   Procedure: DILATATION AND CURETTAGE /HYSTEROSCOPY;  Surgeon: Kemontae Dunklee, 14/01/2019, MD;  Location: [facility] ORS;  Service: Gynecology;  Laterality: N/A;  . ROBOTIC ASSISTED DIAGNOSTIC LAPAROSCOPY N/A 02/18/2017   Procedure: ROBOTIC ASSISTED DIAGNOSTIC LAPAROSCOPY,EXCISION AND ABLATION OF ENDOMETRIOSIS,LYSIS OF ADHESIONS;  Surgeon: 04/20/2017, MD;  Location: [facility];  Service: Gynecology;  Laterality: N/A;  . WISDOM TOOTH EXTRACTION  2013     Allergies  Allergen Reactions  . Amoxicillin Shortness Of Breath    Dizziness, shortness of breath, nausea.  . Other     Must have anti-emetic prior to being given any narcotics   . Latex Rash    Prior to Admission medications   Medication Sig Start Date End Date Taking? Authorizing Provider  acetaminophen (TYLENOL) 500 MG tablet Take 2 tablets (1,000 mg total) by mouth every 6 (six) hours as needed for moderate pain. 07/11/20  Yes McVey, 07/13/20, CNM  albuterol (PROVENTIL HFA;VENTOLIN HFA) 108 (90 Base) MCG/ACT inhaler Inhale 1-2 puffs into the lungs every 6 (six) hours as needed for wheezing or shortness of breath. 11/19/18  Yes 01/18/19, MD  albuterol (PROVENTIL) (2.5 MG/3ML) 0.083% nebulizer solution Take 3 mLs (2.5 mg total) by nebulization every 6 (six) hours as needed for wheezing or shortness of breath. 01/30/19  Yes 02/01/19, FNP  budesonide-formoterol (SYMBICORT) 80-4.5 MCG/ACT inhaler Inhale 2 puffs into the lungs 2 (two) times daily. 06/23/19  Yes 08/23/19, FNP  cyclobenzaprine (FLEXERIL) 10 MG tablet Take 1 tablet (10 mg total) by mouth 3 (three) times daily as needed for muscle spasms. 07/11/20  Yes McVey, 07/13/20, CNM  lamoTRIgine (LAMICTAL) 25 MG tablet Take 5 tablets (125 mg total) by mouth daily. 07/11/20  Yes McVey, 07/13/20, CNM  NIFEdipine (PROCARDIA) 10 MG capsule Take 1 capsule (10  mg total) by mouth every 6 (six) hours as needed (preterm contractions). 07/11/20  Yes McVey, Prudencio Pair, CNM  Prenatal Vit-Fe Fumarate-FA (PRENATAL MULTIVITAMIN) TABS tablet Take 2 tablets by mouth daily.   Yes [provider]  promethazine (PHENERGAN) 12.5 MG tablet Take 12.5-25 mg by mouth every 4 (four) hours as needed.  01/11/20  Yes [provider]  Pyridoxine HCl (VITAMIN B-6 PO) Take by mouth. Patient not taking: Reported on 07/10/2020    [provider]     Prenatal care site: [facility]     Social History: She  reports  that she quit smoking about 10 years ago. Her smoking use included cigarettes. She has never used smokeless tobacco. She reports previous alcohol use. She reports that she does not use drugs.  Family History: family history includes Bipolar disorder in her mother; Breast cancer in her paternal grandmother; Cancer in her paternal grandmother; Depression in her father and paternal grandmother; Diabetes in her mother; Endometriosis in her mother; Schizophrenia in her father.   Review of Systems: A full review of systems was performed and negative except as noted in the HPI.     Physical Exam:  Vital Signs: BP (!) 141/77   Pulse 90   LMP 11/27/2019  General: no acute distress.  HEENT: normocephalic, atraumatic Heart: regular rate & rhythm.  No murmurs/rubs/gallops Lungs: clear to auscultation bilaterally, normal respiratory effort Abdomen: soft, gravid, non-tender;  EFW: A: 6lb0oz, B: 6lb1oz Pelvic:   External: Normal external female genitalia  Cervix: Dilation: 5 / Effacement (%): 80 / Station: Ballotable    Extremities: non-tender, symmetric, 2+ edema bilaterally.  DTRs: 2+  Neurologic: Alert & oriented x 3.    No results found for this or any previous visit (from the past 24 hour(s)).  Pertinent Results:  Prenatal Labs: Blood type/Rh A pos  Antibody screen neg  Rubella Immune  Varicella NON - Immune  RPR NR  HBsAg Neg  HIV NR  GC neg  Chlamydia neg  Genetic screening negative  1 hour GTT Early: 145, 28wks 139  3 hour GTT early  92, 168, 126, 86  28wks not done  GBS negative   FHT:  A: 140, mod, +accels no decels  B: 130, mod +accels, no decels TOCO: q2-3 mins SVE:  Dilation: 5 / Effacement (%): 80 / Station: [employer] B (maternal right) is presenting.   Cephalic  / Cephalic by bedside ultrasound   Assessment:  Janet Rich is a 24 y.o. G2P1001 female at [redacted]w[redacted]d with di/di twins preterm labor.   Plan:  1. Admit to Labor & Delivery 2. CBC, T&S, Clrs, IVF, P/C  ratio, CMP 3. GBS negative - antibiotics not indicated  4. Consents obtained. 5. Continuous efm/toco 6. Covid testing - asymptomatic protocol 7. Initial BP mild range - denies preeclampsia s/sx.  Labs pending. 8. Expectant managment   Contraception: desires non-hormonal method Tdap: 06/19/20 Flu: 07/03/20 Feeding: breast  ----- Ranae Plumber, MD Attending Obstetrician and Gynecologist [facility], Department of OB/GYN [facility]

## 2020-08-14 NOTE — Progress Notes (Signed)
Labor Progress Note  Janet Rich is a 24 y.o. G2P1001 at [redacted]w[redacted]d by LMP admitted for active labor  Subjective: Pt is comfortable and relaxed between UCs. During UCs she has to concentrate and breath, and she complains of feeling it all in her back.  Objective: BP 135/88 (BP Location: Left Arm)   Pulse 80   Temp 97.7 F (36.5 C) (Oral)   Resp 17   Ht 5\' 1"  (1.549 m)   Wt 105.2 kg   LMP 11/27/2019   BMI 43.84 kg/m   Vitals:   08/14/20 0427 08/14/20 0454 08/14/20 0626 08/14/20 0739  BP: 140/87 136/88 134/83 135/88  Pulse: 84 86 80 80  Temp: 97.6 F (36.4 C)   97.7 F (36.5 C)  Resp: 18   17  Height:      Weight:      TempSrc: Oral   Oral  BMI (Calculated):        Fetal Assessment: (Blue)FHT:  FHR: 120 bpm, variability: moderate,  accelerations:  Present,  decelerations:  Absent Category/reactivity:  Category I   (Yellow)FHT:  FHR: 125 bpm, variability: moderate,  accelerations:  Present,  decelerations:  Absent Category/reactivity:  Category I  UC:   regular, every 9 minutes SVE:    Dilation: 6cm  Effacement: 80%  Station:  -3  Consistency: --  Position: --  Membrane status:Likely intact but questionable ROM around 0614 this am Amniotic color: Clear with bloody show  Labs: Lab Results  Component Value Date   WBC 9.6 08/14/2020   HGB 12.7 08/14/2020   HCT 38.9 08/14/2020   MCV 82.6 08/14/2020   PLT 264 08/14/2020    Assessment / Plan: Spontaneous labor, with UCs spacing out  Labor: Will start Pitocin for augmentation Preeclampsia:  Recent BPs noted above, No s/s, will continue to assess. Fetal Wellbeing:  Category I Pain Control:  Epidural requested I/D:  Afebrile, GBS neg, questionable ROM at 0614 Anticipated MOD:  NSVD  08/16/2020, CNM 08/14/2020, 8:26 AM

## 2020-08-14 NOTE — OB Triage Note (Signed)
Pt G2P1 [redacted]w[redacted]d presents [facility] through ED w/ c./o severe back pain and contractions with some vaginal bleeding for a multiple gestation pregnancy. Pt states she had a cervical exam earlier today which preceeded vaginal bleeding. Denies LOF, reports +FM. Monitors applied and assessing. Ward MD in room.

## 2020-08-14 NOTE — Lactation Note (Signed)
This note was copied from a baby's chart. Lactation Consultation Note  Patient Name: Janet Rich [id number] Date: 08/14/2020 Reason for consult: Follow-up assessment;Late-preterm 34-36.6wks;Infant < 6lbs;Multiple gestation;Other (Comment) (low blood sugar)  Twin A glucose: 22 Twin B glucose: 35 Low glucose protocol followed for both. Twin A received glucose gel + 83mL EBM via curved tip sryinge/finger feed; tolerated well. Twin B received 47ml EBM via curved tip syringe/finger feed; tolerated well. LC was able to hand express additional 72mL from both breasts while mom was skin to skin with twin B. Twin B was moved to warmer during finger fed while mom was moved out of bed/to the bathroom for the first time. Post feed glucose check with be around 5:10-5:20 for both. Encouraged ongoing skin to skin due to temp concerns and low BS, frequent hand expression, possible implementation of pump and/or hand pump for supplementation as needed. Mom knowledgeable in milk expression, hand expression, milk supply and demand, and normal course of lactation. Encouraged to ask for help as needed.  Maternal Data Formula Feeding for Exclusion: No Has patient been taught Hand Expression?: Yes Does the patient have breastfeeding experience prior to this delivery?: Yes  Feeding Feeding Type: Breast Milk  LATCH Score Latch: Repeated attempts needed to sustain latch, nipple held in mouth throughout feeding, stimulation needed to elicit sucking reflex.  Audible Swallowing: A few with stimulation  Type of Nipple: Everted at rest and after stimulation  Comfort (Breast/Nipple): Soft / non-tender  Hold (Positioning): Assistance needed to correctly position infant at breast and maintain latch.  LATCH Score: 7  Interventions Interventions: Breast feeding basics reviewed;Hand express;Expressed milk  Lactation Tools Discussed/Used     Consult Status Consult Status: Follow-up Date:  08/15/20 Follow-up type: In-patient    Danford Bad 08/14/2020, 4:27 PM

## 2020-08-14 NOTE — Progress Notes (Signed)
Patient has met criteria for preeclampsia with 2 systolic blood pressures >759, and a protein creatine ratio of .74.   She is in labor and denies symptoms, no signs.    PLT: 264 AST: 16 ALT: 12  At this time no other intervention needed, as she does not meet criteria for severe features.  We will watch her closely clinically, and repeat labs as her labor progresses.  ----- Larey Days, MD, [place] Attending Obstetrician and Gynecologist [facility], Department of [facility]

## 2020-08-15 LAB — CBC
HCT: 36.3 % (ref 36.0–46.0)
Hemoglobin: 11.8 g/dL — ABNORMAL LOW (ref 12.0–15.0)
MCH: 26.6 pg (ref 26.0–34.0)
MCHC: 32.5 g/dL (ref 30.0–36.0)
MCV: 81.9 fL (ref 80.0–100.0)
Platelets: 220 10*3/uL (ref 150–400)
RBC: 4.43 MIL/uL (ref 3.87–5.11)
RDW: 16.7 % — ABNORMAL HIGH (ref 11.5–15.5)
WBC: 11.1 10*3/uL — ABNORMAL HIGH (ref 4.0–10.5)
nRBC: 0 % (ref 0.0–0.2)

## 2020-08-15 LAB — COMPREHENSIVE METABOLIC PANEL
ALT: 11 U/L (ref 0–44)
AST: 19 U/L (ref 15–41)
Albumin: 2.4 g/dL — ABNORMAL LOW (ref 3.5–5.0)
Alkaline Phosphatase: 72 U/L (ref 38–126)
Anion gap: 7 (ref 5–15)
BUN: 8 mg/dL (ref 6–20)
CO2: 23 mmol/L (ref 22–32)
Calcium: 8.5 mg/dL — ABNORMAL LOW (ref 8.9–10.3)
Chloride: 104 mmol/L (ref 98–111)
Creatinine, Ser: 0.49 mg/dL (ref 0.44–1.00)
GFR, Estimated: >60 mL/min (ref >60)
Glucose, Bld: 77 mg/dL (ref 70–99)
Potassium: 3.8 mmol/L (ref 3.5–5.1)
Sodium: 134 mmol/L — ABNORMAL LOW (ref 135–145)
Total Bilirubin: 0.4 mg/dL (ref 0.3–1.2)
Total Protein: 6.1 g/dL — ABNORMAL LOW (ref 6.5–8.1)

## 2020-08-15 MED ORDER — SENNOSIDES-DOCUSATE SODIUM 8.6-50 MG PO TABS
2.0000 | ORAL_TABLET | Freq: Every day | ORAL | Status: DC
  Administered 2020-08-16: 2 via ORAL
  Filled 2020-08-15 (×2): qty 2

## 2020-08-15 MED ORDER — POLYETHYLENE GLYCOL 3350 17 G PO PACK
17.0000 g | PACK | Freq: Every day | ORAL | Status: DC
  Administered 2020-08-15 – 2020-08-16 (×2): 17 g via ORAL
  Filled 2020-08-15 (×3): qty 1

## 2020-08-15 NOTE — Anesthesia Postprocedure Evaluation (Signed)
Anesthesia Post Note  Patient: Janet Rich  Procedure(s) Performed: VAGINAL DELIVERY (N/A )  Patient location during evaluation: Mother Baby Anesthesia Type: Epidural Level of consciousness: awake and alert Pain management: pain level controlled Vital Signs Assessment: post-procedure vital signs reviewed and stable Respiratory status: spontaneous breathing, nonlabored ventilation and respiratory function stable Cardiovascular status: stable Postop Assessment: no headache, no backache and epidural receding Anesthetic complications: no   No complications documented.   Last Vitals:  Vitals:   08/15/20 0023 08/15/20 0420  BP: 125/73 108/63  Pulse: 80 72  Resp: 18 16  Temp: 36.7 C 36.8 C  SpO2: 99% 99%    Last Pain:  Vitals:   08/15/20 0420  TempSrc: Oral  PainSc:                  Rica Mast

## 2020-08-15 NOTE — Lactation Note (Signed)
This note was copied from a baby's chart. Lactation Consultation Note  Patient Name: Janet Rich [id number] Date: 08/15/2020 Reason for consult: Follow-up assessment;Late-preterm 34-36.6wks;Infant < 6lbs;Multiple gestation  Lactation follow-up.  Twins were given higher calorie formula overnight for BS stabilization and to allow mom to rest. When asked about feeding plan for the day, mom desires to continue putting twins to the breast separately, mainly on right breast as she feels more comfortable, and expressing the left breast. Supplement will continue to be given as needed until mom's volume matches demands.  LC educated parents on importance of stimulation and milk removal for supply and demand. Encouraged to offer the breast first for each feeding: reviewed position and alignment, and determining deep latch with productive and rhythmic sucking patterns. Reviewed paced bottle feeding when providing supplement; but encouraged to give all expressed breastmilk prior to formula- parents are agreeable.  LC assisted with hand expression from left breast and obtained 57mL; dad plans to give that to Twin B after she has been at the breast.  Parents reporting that Twin A is breastfeeding well with swallows, plenty of wet/stools, and little to no formula accepted after last feed indicating that she was full. LC praised parents for their teamwork and dedication to providing breastmilk to their babies. Encouraged to call out for support as needed.   Maternal Data Formula Feeding for Exclusion: No Has patient been taught Hand Expression?: Yes Does the patient have breastfeeding experience prior to this delivery?: Yes  Feeding Feeding Type: Bottle Fed - Formula Nipple Type: Slow - flow  LATCH Score                   Interventions Interventions: Breast feeding basics reviewed;Hand express;Expressed milk  Lactation Tools Discussed/Used Tools: Pump Breast pump type: Double-Electric  Breast Pump   Consult Status Consult Status: Follow-up Date: 08/15/20 Follow-up type: Call as needed    Danford Bad 08/15/2020, 1:28 PM

## 2020-08-15 NOTE — Progress Notes (Signed)
Post Partum Day 1 Subjective: Doing well, no complaints.  Tolerating regular diet, pain with PO meds, voiding and ambulating without difficulty. Tired- Janet Rich and her husband alternated some of the baby care overnight so they both got a little sleep.   No CP SOB Fever,Chills, N/V or leg pain; denies nipple or breast pain, no HA change of vision, RUQ/epigastric pain  Objective: BP 116/77 (BP Location: Left Arm)   Pulse 87 Comment: 87  Temp 98.1 F (36.7 C) (Oral)   Resp 18   Ht 5\' 1"  (1.549 m)   Wt 105.2 kg   LMP 11/27/2019   SpO2 99%   Breastfeeding Unknown   BMI 43.84 kg/m    Physical Exam:  General: NAD Breasts: soft/nontender CV: RRR Pulm: nl effort, CTABL Abdomen: soft, NT, BS x 4 Perineum: minimal edema, lacerations repair well approximated Lochia: small Uterine Fundus: fundus firm and 1 fb below umbilicus DVT Evaluation: no cords, ttp LEs   Recent Labs    08/14/20 0050 08/15/20 0559  HGB 12.7 11.8*  HCT 38.9 36.3  WBC 9.6 11.1*  PLT 264 220    Assessment/Plan: 24 y.o. G2P1103 postpartum day # 1  - Continue routine PP care - Lactation consult prn.  - CBC stable, no e/o anemia.  - Immunization status: Needs  Varicella prior to DC  Disposition: Does not desire Dc home today.     08/17/20 Ronnesha Mester, CNM 08/15/2020  1:16 PM

## 2020-08-16 ENCOUNTER — Encounter: Payer: Self-pay | Admitting: Obstetrics and Gynecology

## 2020-08-16 MED ORDER — IBUPROFEN 600 MG PO TABS: 600.0000 mg | ORAL_TABLET | Freq: Four times a day (QID) | ORAL | 1 refills | Status: DC | PRN

## 2020-08-16 MED ORDER — VARICELLA VIRUS VACCINE LIVE 1350 PFU/0.5ML IJ SUSR
0.5000 mL | Freq: Once | INTRAMUSCULAR | Status: AC
  Administered 2020-08-16: 0.5 mL via SUBCUTANEOUS
  Filled 2020-08-16 (×2): qty 0.5

## 2020-08-16 MED ORDER — OXYCODONE HCL 5 MG PO TABS
5.0000 mg | ORAL_TABLET | ORAL | 0 refills | Status: AC | PRN
Start: 2020-08-16 — End: 2020-08-23

## 2020-08-16 NOTE — Lactation Note (Signed)
Mom desires to rent a Medela Symphony breast pump, she has two pumps at home, a Medela and Spectra but desires to rent hospital grade pump until breastmilk is increased. Rental agreement completed and payment taken and processed.  She has used this type pump with her last child and is currently pumping with the Symphony pump while hospitalized.  She plans on being discharge today.

## 2020-08-16 NOTE — Discharge Instructions (Signed)
Please call your doctor or return to the ER if you experience any chest pains, shortness of breath, dizziness, visual changes, severe headache (unrelieved by pain meds), fever greater than 101, any heavy bleeding (saturating more than 1 pad per hour), large clots, or foul smelling discharge, any worsening abdominal pain and cramping that is not controlled by pain medication, any calf/leg pain or redness, any breast concerns (redness/pain), or any signs of postpartum depression. No tampons, enemas, douches, or sexual intercourse for 6 weeks. Also avoid tub baths, hot tubs, or swimming for 6 weeks.  ° °

## 2020-08-16 NOTE — Progress Notes (Signed)
Discharge order received from doctor. Reviewed discharge instructions and prescriptions with patient and answered all questions. Follow up appointments given. Patient verbalized understanding. ID bands checked. Patient discharged home with infant via wheelchair by nursing/auxillary.     Estle Huguley, RN  

## 2020-08-19 ENCOUNTER — Telehealth: Payer: Self-pay

## 2020-08-19 DIAGNOSIS — Z419 Encounter for procedure for purposes other than remedying health state, unspecified: Secondary | ICD-10-CM | POA: Diagnosis not present

## 2020-08-19 DIAGNOSIS — O1495 Unspecified pre-eclampsia, complicating the puerperium: Secondary | ICD-10-CM | POA: Diagnosis not present

## 2020-08-19 NOTE — Telephone Encounter (Signed)
Transition Care Management Follow-up Telephone Call  Date of discharge and from where: 08/16/2020 from [facility]  How have you been since you were released from the hospital? Patient and babies are doing well.   Any questions or concerns? No  Items Reviewed:  Did the pt receive and understand the discharge instructions provided? Yes   Medications obtained and verified? No   Other? No   Any new allergies since your discharge? No   Dietary orders reviewed? N/A  Do you have support at home? Yes   Functional Questionnaire: (I = Independent and D = Dependent) ADLs: I  Bathing/Dressing- I  Meal Prep- I  Eating- I  Maintaining continence- I  Transferring/Ambulation- I  Managing Meds- I  Follow up appointments reviewed:   [facility]u appt confirmed? See previous Note  Are transportation arrangements needed? No   If their condition worsens, is the pt aware to call PCP or go to the Emergency Dept.? Yes  Was the patient provided with contact information for the PCP's office or ED? Yes  Was to pt encouraged to call back with questions or concerns? Yes

## 2020-08-19 NOTE — Telephone Encounter (Signed)
1 Go to [facility] on 08/19/2020; Monday @ 11:30 am for blood pressure check 2 Schedule an appointment with Haroldine Laws, CNM (Certified Nurse Midwife) on 08/28/2020; Wednesday 11/10 @ 2:00 pm for a postpartum laceration check .

## 2020-08-22 DIAGNOSIS — Z0130 Encounter for examination of blood pressure without abnormal findings: Secondary | ICD-10-CM | POA: Diagnosis not present

## 2020-08-27 ENCOUNTER — Telehealth: Payer: Self-pay | Admitting: Licensed Clinical Social Worker

## 2020-08-27 NOTE — Telephone Encounter (Signed)
TELEHEALTH VIRTUAL MOOD VISIT ENCOUNTER NOTE  I connected with Marvel Plan on 08/27/20 by telephone and verified that I am speaking with the correct person using two identifiers.   I discussed the limitations, risks, security and privacy concerns of performing an evaluation and management service by telephone and the availability of in person appointments. I also discussed with the patient that there may be a patient responsible charge related to this service. The patient expressed understanding and agreed to proceed.  LCSW introduced self and established psychological safety.  Patient verbally consented to this telephonic session.   Patient is located at home.   LCSW located at remote work site  Time visit started: 1:59 Time visit ended: 2:10  SUBJECTIVE: Patient clinic or provider recommended a 2 week mood check due to risk factors for postpartum mood disorder.  Patient delivered on 08/14/20  ASSESSMENT: Patient currently experiencing mood stability and denies any current mood or psychosocial concerns.    Patient may benefit from continued outpatient therapy to continue to monitor mood and safety. And continued medication management.   PRESENTING CONCERNS: Patient and/or family reports the following symptoms/concerns: NA Duration of problem: NA; Severity of problem: NA  STRENGTHS (Protective Factors/Coping Skills): Patient has a supportive husband and also has supportive family relationships. She reports adherence to medication management. Additionally, patient is currently breastfeeding.   INTERVENTIONS: Interventions utilized:  Psychoeducation and/or Health Education Standardized Assessments completed: Not Needed  . Conducted brief assessment  . Provide brief psychoeducation on postpartum mood and anxiety disorders signs and symptoms, including information on Baby Blues, Postpartum Depression, and Postpartum Anxiety.   Informed patient to call her provider right away or get  emergency help if she experiences any of the following symptoms: . Feelings of hopelessness and total despair. . Feeling out of touch with reality (hearing or seeing things other people don't). . Feeling that you might hurt yourself or your baby.  PLAN: 1. Patient is scheduled to see LCSW at [facility] on 09/03/20 at 430 via Zoom   Kathreen Cosier

## 2020-08-27 NOTE — Telephone Encounter (Signed)
-----   Message from Kathreen Cosier, [place] sent at 08/15/2020  5:20 PM EDT ----- Regarding: PP mood check needed week of 11/8 - 11/10

## 2020-09-03 ENCOUNTER — Ambulatory Visit: Payer: Medicaid Other | Admitting: Licensed Clinical Social Worker

## 2020-09-03 NOTE — Progress Notes (Unsigned)
Counselor/Therapist Progress Note  [name], MRN: [id number],    Date: 09/03/2020  Time Spent: ***   Treatment Type: Individual Therapy  Reported Symptoms: {CHL AMB Reported Symptoms:7697026645}  Mental Status Exam:  Appearance:   {PSY:22683}     Behavior:  {PSY:21022743}  Motor:  {PSY:22302}  Speech/Language:   {PSY:22685}  Affect:  {PSY:22687}  Mood:  {PSY:31886}  Thought process:  {PSY:31888}  Thought content:    {PSY:301-272-8527}  Sensory/Perceptual disturbances:    {PSY:352 870 3237}  Orientation:  {PSY:30297}  Attention:  {PSY:22877}  Concentration:  {PSY:843-028-2726}  Memory:  {PSY:310-263-7464}  Fund of knowledge:   {PSY:843-028-2726}  Insight:    {PSY:843-028-2726}  Judgment:   {PSY:843-028-2726}  Impulse Control:  {PSY:843-028-2726}   Risk Assessment: Danger to Self:  No Self-injurious Behavior: No Danger to Others: No Duty to Warn:no Physical Aggression / Violence:No  Access to Firearms a concern: No  Gang Involvement:No   Subjective: Patient was engaged and cooperative throughout the session using time effectively to discuss   Patient voices continued motivation for treatment and understanding of  . Patient is likely to benefit from future treatment because  remains motivated to decrease  And   and reports benefit of regular sessions in addressing these symptoms.   Interventions: {PSY:534-464-0809} Established psychological safety. Checked in with patient regarding her week. Reviewed previous session regarding  Therapist assisted, actively listened, taught, shared, role/played, provided Patient was engaged and cooperative throughout the session using time effectively to discuss   Patient voices continued motivation for treatment and understanding of  . Patient is likely to benefit from future treatment because  remains motivated to decrease  And   and reports benefit of regular sessions in addressing these symptoms.    Diagnosis:   ICD-10-CM   1.  Mild bipolar II disorder, most recent episode major depressive (HCC)  F31.81   2. Generalized anxiety disorder  F41.1     Plan: Patient's goal SN:KNLZ with anxiety and working through things.She reports benefit from having someone to listen to her.  Treatment Target: Understand the relationship between thoughts, emotions, and behaviors   Psychoeducation on CBT model   Teach the connection between thoughts, emotions, and behaviors   Treatment Target: Increase realistic balanced thinking   Explore patient's thoughts, beliefs, automatic thoughts, assumptions   Identify unhelpful thinking patterns   Process distress and allow for emotional release   Questioning and challenging thoughts  Cognitive reappraisal   Treatment Target: Increase coping skills  Mindfulness practices   Deep breathing   Teach distress tolerance techniques -"what helps me"  STOP technique  Treatment Target: Improve relationship satisfaction with spouse   Communication techniques  Boundary setting   Relationship values exploration  Treatment Target 1: Increase knowledge of child development and positive parenting practices and increase parenting confidence  Objective 1: Through use of Triple P level 4 and/or Stepping Stones, increase parenting confidence and build awareness of child development, and increase parenting skills  Provide psychoeducation on Triple P positive parenting   Explore current parenting strategies   Parenting coaching for developing positive relationship with patient, encouraging desirable behavior, developing clear boundaries and behavioral expectations, providing effective commands and managing misbehavior.   Provide opportunity to explore interventions from Triple P that can support patient in effective parenting practices  Teach behavior management techniques from Triple P to reduce negative behaviors, including consistency, age-appropriate expectations, prompting  positive behaviors, using praise and rewards, using clear instructions, and natural consequences to help with listening.  Explore barriers to  effective parenting and problem solve Future Appointments  Date Time Provider Department Center  09/03/2020  4:30 PM Kathreen Cosier, LCSW [facility] None    Interpreter used: NA  Kathreen Cosier, LCSW

## 2020-09-04 ENCOUNTER — Ambulatory Visit: Payer: Medicaid Other | Admitting: Licensed Clinical Social Worker

## 2020-09-04 DIAGNOSIS — M9900 Segmental and somatic dysfunction of head region: Secondary | ICD-10-CM | POA: Diagnosis not present

## 2020-09-04 DIAGNOSIS — M9901 Segmental and somatic dysfunction of cervical region: Secondary | ICD-10-CM | POA: Diagnosis not present

## 2020-09-04 DIAGNOSIS — R42 Dizziness and giddiness: Secondary | ICD-10-CM | POA: Diagnosis not present

## 2020-09-04 DIAGNOSIS — F3181 Bipolar II disorder: Secondary | ICD-10-CM

## 2020-09-04 DIAGNOSIS — M530 Cervicocranial syndrome: Secondary | ICD-10-CM | POA: Diagnosis not present

## 2020-09-04 NOTE — Progress Notes (Signed)
Counselor/Therapist Progress Note  [name], MRN: [id number],    Date: 09/04/2020  Time Spent: 40 minutes    Treatment Type: Individual Therapy  Reported Symptoms: overall stable mood, mild baby blues - episodes of sadness and overwhelm   Mental Status Exam:  Appearance:   Casual and Neat     Behavior:  Appropriate and Sharing  Motor:  Normal  Speech/Language:   Normal Rate  Affect:  Appropriate, Congruent and Full Range  Mood:  euthymic  Thought process:  normal  Thought content:    WNL  Sensory/Perceptual disturbances:    WNL  Orientation:  oriented to person, place, time/date and situation  Attention:  Good  Concentration:  Good  Memory:  WNL  Fund of knowledge:   Good  Insight:    Good  Judgment:   Good  Impulse Control:  Good   Risk Assessment: Danger to Self:  No Self-injurious Behavior: No Danger to Others: No Duty to Warn:no Physical Aggression / Violence:No  Access to Firearms a concern: No  Gang Involvement:No   Subjective: Patient was engaged and cooperative throughout the session using time effectively to discuss thoughts and feelings. Patient voices continued motivation for treatment and understanding of baby blues vs. Depression. Patient is likely to benefit from future treatment because she is motivated to manage symptoms and functioning and reports benefit of regular sessions.   Interventions: Cognitive Behavioral Therapy Established psychological safety. Checked in with patient regarding postpartum symptoms and adjustment. Provided supportive space for patient to process delivery and adjustment to new babies. Reviewed information on baby blues. Provided support through active listening, validation of feelings, and highlighted patient's strengths.   Diagnosis:   ICD-10-CM   1. Mild bipolar II disorder, most recent episode major depressive (HCC)  F31.81    Plan: Patient's goal XB:MWUX with anxiety and working through things.She  reports benefit from having someone to listen to her.  Treatment Target: Understand the relationship between thoughts, emotions, and behaviors   Psychoeducation on CBT model   Teach the connection between thoughts, emotions, and behaviors   Treatment Target: Increase realistic balanced thinking   Explore patient's thoughts, beliefs, automatic thoughts, assumptions   Identify unhelpful thinking patterns   Process distress and allow for emotional release   Questioning and challenging thoughts  Cognitive reappraisal   Treatment Target: Increase coping skills  Mindfulness practices   Deep breathing   Teach distress tolerance techniques -"what helps me"  STOP technique  Treatment Target: Improve relationship satisfaction with spouse   Communication techniques  Boundary setting   Relationship values exploration  Future Appointments  Date Time Provider Department Center  09/10/2020 11:00 AM Kathreen Cosier, LCSW [facility] None    Interpreter used: NA  Kathreen Cosier, LCSW

## 2020-09-10 ENCOUNTER — Ambulatory Visit: Payer: Medicaid Other | Admitting: Licensed Clinical Social Worker

## 2020-09-10 DIAGNOSIS — F411 Generalized anxiety disorder: Secondary | ICD-10-CM

## 2020-09-10 DIAGNOSIS — F3181 Bipolar II disorder: Secondary | ICD-10-CM

## 2020-09-10 NOTE — Progress Notes (Signed)
Counselor/Therapist Progress Note  [name], MRN: [id number],    Date: 09/10/2020  Time Spent: 44 minutes    Treatment Type: Individual Therapy  Reported Symptoms: overall stablemood, mild melancholy, able to sleep when babies are sleeping,  Mental Status Exam:  Appearance:   Casual     Behavior:  Appropriate and Sharing  Motor:  Normal  Speech/Language:   Normal Rate  Affect:  Appropriate and Congruent  Mood:  normal  Thought process:  normal  Thought content:    WNL  Sensory/Perceptual disturbances:    WNL  Orientation:  oriented to person, place, time/date and situation  Attention:  Good  Concentration:  Good  Memory:  WNL  Fund of knowledge:   Good  Insight:    Good  Judgment:   Good  Impulse Control:  Good   Risk Assessment: Danger to Self:  No Self-injurious Behavior: No Danger to Others: No Duty to Warn:no Physical Aggression / Violence:No  Access to Firearms a concern: No  Gang Involvement:No   Subjective: Patient was engaged and cooperative throughout the session using time effectively to discuss thoughts and feelings. Patient voices continued motivation for treatment and understanding of postpartum mood and anxiety issues. Patient is likely to benefit from future treatment because she remains motivated to manage symptoms and maintain functioning and reports benefit from a combinatin of med management and of regular sessions in addressing symptoms.   Interventions: Cognitive Behavioral Therapy Established psychological safety. Checked in with patient regarding her week. Engaged patient in processing current psychosocial stressors. Praised patient for adjustment to new babies. Highlighted unhelpful thoughts leading to hurt feelings/sadness and frustration, reframing these thoughts. Reviewed self-care tips, including sleep hygiene, drinking fluids, eating regularly, and supportive relationships. Provided support through active listening, validation  of feelings, and highlighted patient's strengths.   Diagnosis:   ICD-10-CM   1. Mild bipolar II disorder, most recent episode major depressive (HCC)  F31.81   2. Generalized anxiety disorder  F41.1     Plan: Patient's goal QI:WLNL with anxiety and working through things.She reports benefit from having someone to listen to her.  Treatment Target: Understand the relationship between thoughts, emotions, and behaviors   Psychoeducation on CBT model   Teach the connection between thoughts, emotions, and behaviors   Treatment Target: Increase realistic balanced thinking   Explore patient's thoughts, beliefs, automatic thoughts, assumptions   Identify unhelpful thinking patterns   Process distress and allow for emotional release   Questioning and challenging thoughts  Cognitive reappraisal   Treatment Target: Increase coping skills  Mindfulness practices   Deep breathing   Teach distress tolerance techniques -"what helps me"  STOP technique  Treatment Target: Improve relationship satisfaction with spouse   Communication techniques  Boundary setting   Relationship values exploration  Future Appointments  Date Time Provider Department Center  09/16/2020  5:00 PM Kathreen Cosier, LCSW [facility] None   Interpreter used: NA   Kathreen Cosier, LCSW

## 2020-09-16 ENCOUNTER — Ambulatory Visit: Payer: Medicaid Other | Admitting: Licensed Clinical Social Worker

## 2020-09-16 DIAGNOSIS — F411 Generalized anxiety disorder: Secondary | ICD-10-CM

## 2020-09-16 DIAGNOSIS — F3181 Bipolar II disorder: Secondary | ICD-10-CM

## 2020-09-16 NOTE — Progress Notes (Signed)
Counselor/Therapist Progress Note  [name], MRN: [id number],    Date: 09/16/2020  Time Spent: 30 minutes    Treatment Type: Individual Therapy  Reported Symptoms: Anhedonia, Sleep disturbance, Fatigue and mild depressive symptoms/low mood, ruminating thoughts and mild anxiety symptoms  Mental Status Exam:  Appearance:   Casual     Behavior:  Appropriate and Sharing  Motor:  Normal  Speech/Language:   Normal Rate  Affect:  Appropriate, Congruent and Full Range  Mood:  dysthymic and tired, low energy  Thought process:  normal  Thought content:    WNL  Sensory/Perceptual disturbances:    WNL  Orientation:  oriented to person, place and time/date  Attention:  Good  Concentration:  Good  Memory:  WNL  Fund of knowledge:   Good  Insight:    Good  Judgment:   Good  Impulse Control:  Good   Risk Assessment: Danger to Self:  No Self-injurious Behavior: No Danger to Others: No Duty to Warn:no Physical Aggression / Violence:No  Access to Firearms a concern: No  Gang Involvement:No   Subjective: Patient was engaged and cooperative throughout the session using time effectively to discuss thoughts and feelings. Patient voices continued motivation for treatment and understanding of mood and anxiety issues and the importance of adherence to med management regimine. Patient reports not taking meds as prescribed and will continue to work towards taking daily. Patient is likely to benefit from future treatment because she remains motivated to decrease symptoms and  reports benefit of regular sessions in addressing anxiety and mood symptoms.   Interventions: Cognitive Behavioral Therapy Established psychological safety. Checked in with patient. Engaged patient in processing current psychosocial stressors, low mood/mild depressive symptoms. Normalized and validated patient's thoughts and feelings around challenges of postpartum period. Explored perception of these challenges,  highlighting unhelpful thoughts and reframed these thoughts leading to increased depressive/anxiety symptoms. of distress. Discussed the importance of taking meds as prescribed. Provided support through active listening, validation of feelings, and highlighted patient's strengths.   Diagnosis:   ICD-10-CM   1. Mild bipolar II disorder, most recent episode major depressive (HCC)  F31.81   2. Generalized anxiety disorder  F41.1     Plan:  Patient's goal DJ:SHFW with anxiety and working through things.She reports benefit from having someone to listen to her.  Treatment Target: Understand the relationship between thoughts, emotions, and behaviors   Psychoeducation on CBT model   Teach the connection between thoughts, emotions, and behaviors   Treatment Target: Increase realistic balanced thinking   Explore patient's thoughts, beliefs, automatic thoughts, assumptions   Identify unhelpful thinking patterns   Process distress and allow for emotional release   Questioning and challenging thoughts  Cognitive reappraisal   Treatment Target: Increase coping skills  Mindfulness practices   Deep breathing   Teach distress tolerance techniques -"what helps me"  STOP technique  Treatment Target: Improve relationship satisfaction with spouse   Communication techniques  Boundary setting   Relationship values exploration  Future Appointments  Date Time Provider Department Center  09/24/2020  4:00 PM Kathreen Cosier, LCSW [facility] None    Interpreter used: NA  Kathreen Cosier, LCSW

## 2020-09-17 DIAGNOSIS — R42 Dizziness and giddiness: Secondary | ICD-10-CM | POA: Diagnosis not present

## 2020-09-17 DIAGNOSIS — M9901 Segmental and somatic dysfunction of cervical region: Secondary | ICD-10-CM | POA: Diagnosis not present

## 2020-09-17 DIAGNOSIS — M9900 Segmental and somatic dysfunction of head region: Secondary | ICD-10-CM | POA: Diagnosis not present

## 2020-09-17 DIAGNOSIS — M530 Cervicocranial syndrome: Secondary | ICD-10-CM | POA: Diagnosis not present

## 2020-09-18 DIAGNOSIS — Z419 Encounter for procedure for purposes other than remedying health state, unspecified: Secondary | ICD-10-CM | POA: Diagnosis not present

## 2020-09-24 ENCOUNTER — Ambulatory Visit: Payer: Medicaid Other | Admitting: Licensed Clinical Social Worker

## 2020-09-24 DIAGNOSIS — F411 Generalized anxiety disorder: Secondary | ICD-10-CM

## 2020-09-24 DIAGNOSIS — F3181 Bipolar II disorder: Secondary | ICD-10-CM

## 2020-09-24 NOTE — Progress Notes (Signed)
Counselor/Therapist Progress Note  [name], MRN: [id number],    Date: 09/24/2020  Time Spent: 15 minutes patient requested to reschedule  Treatment Type: Individual Therapy  Reported Symptoms: mood instability, frustration, anxiety, overwhelm  Mental Status Exam:  Appearance:   Casual     Behavior:  Appropriate and Sharing  Motor:  Normal  Speech/Language:   Normal Rate  Affect:  Appropriate and Congruent  Mood:  normal  Thought process:  normal  Thought content:    WNL  Sensory/Perceptual disturbances:    WNL  Orientation:  oriented to person, place and time/date  Attention:  Good  Concentration:  Good  Memory:  WNL  Fund of knowledge:   Good  Insight:    Good  Judgment:   Good  Impulse Control:  Good   Risk Assessment: Danger to Self:  No Self-injurious Behavior: No Danger to Others: No Duty to Warn:no Physical Aggression / Violence:No  Access to Firearms a concern: No  Gang Involvement:No   Subjective: Patient was engaged but needed to discontinue session due to multiple stressors. Patient agreed to phone check in tomorrow.    Interventions: Cognitive Behavioral Therapy Established psychological safety. Checked in with patient regarding her week.  Engaged patient in processing current psychosocial stressors, mood instability and anxiety due to multiple stressors. LCSW agreed to end session early due to patient's need to address more pressing concerns. LCSW agreed to conduct phone based check-in tomorrow at 5pm.   Diagnosis:   ICD-10-CM   1. Mild bipolar II disorder, most recent episode major depressive (HCC)  F31.81   2. Generalized anxiety disorder  F41.1     Plan: Patient's goal EX:HBZJ with anxiety and working through things.She reports benefit from having someone to listen to her.  Treatment Target: Understand the relationship between thoughts, emotions, and behaviors   Psychoeducation on CBT model   Teach the connection between  thoughts, emotions, and behaviors   Treatment Target: Increase realistic balanced thinking   Explore patient's thoughts, beliefs, automatic thoughts, assumptions   Identify unhelpful thinking patterns   Process distress and allow for emotional release   Questioning and challenging thoughts  Cognitive reappraisal   Treatment Target: Increase coping skills  Mindfulness practices   Deep breathing   Teach distress tolerance techniques -"what helps me"  STOP technique  Treatment Target: Improve relationship satisfaction with spouse   Communication techniques  Boundary setting   Relationship values exploration Future Appointments  Date Time Provider Department Center  09/25/2020  5:00 PM Kathreen Cosier, LCSW [facility] None    Interpreter used: NA   Kathreen Cosier, LCSW

## 2020-09-25 ENCOUNTER — Ambulatory Visit: Payer: Medicaid Other | Admitting: Licensed Clinical Social Worker

## 2020-09-25 DIAGNOSIS — Z124 Encounter for screening for malignant neoplasm of cervix: Secondary | ICD-10-CM | POA: Diagnosis not present

## 2020-09-25 DIAGNOSIS — Z1332 Encounter for screening for maternal depression: Secondary | ICD-10-CM | POA: Diagnosis not present

## 2020-10-01 ENCOUNTER — Ambulatory Visit: Payer: Medicaid Other | Admitting: Licensed Clinical Social Worker

## 2020-10-01 DIAGNOSIS — F3181 Bipolar II disorder: Secondary | ICD-10-CM

## 2020-10-01 DIAGNOSIS — F411 Generalized anxiety disorder: Secondary | ICD-10-CM

## 2020-10-01 NOTE — Progress Notes (Signed)
Counselor/Therapist Progress Note  [name], MRN: [id number],    Date: 10/01/2020  Time Spent: 40 minutes   Treatment Type: Individual Therapy  Reported Symptoms: mood instability, anxiety, anxious thoughts, irritability;Visual hallucinations   Mental Status Exam:  Appearance:   Casual     Behavior:  Appropriate and Sharing  Motor:  Normal  Speech/Language:   Normal Rate  Affect:  Appropriate, Congruent and Full Range  Mood:  normal  Thought process:  normal  Thought content:    WNL  Sensory/Perceptual disturbances:    WNL  Orientation:  oriented to person, place, time/date, situation and day of week  Attention:  Good  Concentration:  Good  Memory:  WNL  Fund of knowledge:   Good  Insight:    Good  Judgment:   Good  Impulse Control:  Good   Risk Assessment: Danger to Self:  No Self-injurious Behavior: No Danger to Others: No Duty to Warn:no Physical Aggression / Violence:No  Access to Firearms a concern: No  Gang Involvement:No   Subjective: Patient was engaged and cooperative throughout the session using time effectively to discuss thoughts and feelings. Patient voices mood instability - good days and down days. She reports visual hallucinations but voices good insight - knowing that these things "cannot" be real. She describes having visual hallucinations over the past year, but reports more frequent occurrences - seeing a mans face in the window, seeing a person walk in-front of her house, and seeing a fox or wolf on her front porch. She reports that these things scare her in the moment but she then rationalizes why these things could not have been there. Patient reports that she is a bit paranoid but her description sounds more like anxiety/worry.   Patient reports continued motivation for treatment and understanding of mood and anxiety issues. Patient is likely to benefit from future treatment because she remains motivated to decrease symptoms and  improve functioning. Patient is recommended to schedule appointment with psychiatrist due to report of worsening symptoms.   Interventions: assessment and safety check  Established psychological safety. Checked in with patient regarding her week. Validated patient's feelings of sadness, frustration and overwhelm. Explored patient's report of visual hallucinations and paranoia; fathers diagnosis of schizophrenia. Encouraged patient to contact psychiatrist to get an appointment due to reported symptoms. Discussed postpartum psychosis  Informed patient to call her provider right away or get emergency help if she experiences any of the following symptoms: . Feelings of hopelessness and total despair. . Feeling out of touch with reality (hearing or seeing things other people don't). . Feeling that you might hurt yourself or your baby.  Discussed sleep hygiene and the importance of utilizing support system. Patient in agreement with calling to schedule an appointment with psychiatrist tomorrow. Patient agreed to LCSW check-in call on 10/03/20. Notified patient of LCSW's upcoming paid time off (PTO).   Provided support through active listening, validation of feelings, and highlighted patient's strengths.   Diagnosis:   ICD-10-CM   1. Mild bipolar II disorder, most recent episode major depressive (HCC)  F31.81   2. Generalized anxiety disorder  F41.1     Plan: Patient's goal AY:TKZS with anxiety and working through things.She reports benefit from having someone to listen to her.  Treatment Target: Understand the relationship between thoughts, emotions, and behaviors   Psychoeducation on CBT model   Teach the connection between thoughts, emotions, and behaviors   Treatment Target: Increase realistic balanced thinking   Explore patient's thoughts, beliefs,  automatic thoughts, assumptions   Identify unhelpful thinking patterns   Process distress and allow for emotional release    Questioning and challenging thoughts  Cognitive reappraisal   Treatment Target: Increase coping skills  Mindfulness practices   Deep breathing   Teach distress tolerance techniques -"what helps me"  STOP technique  Future Appointments  Date Time Provider Department Center  10/08/2020  9:00 AM Kathreen Cosier, LCSW [facility] None   Interpreter used: NA  Kathreen Cosier, LCSW

## 2020-10-02 DIAGNOSIS — R42 Dizziness and giddiness: Secondary | ICD-10-CM | POA: Diagnosis not present

## 2020-10-02 DIAGNOSIS — M9901 Segmental and somatic dysfunction of cervical region: Secondary | ICD-10-CM | POA: Diagnosis not present

## 2020-10-02 DIAGNOSIS — M9900 Segmental and somatic dysfunction of head region: Secondary | ICD-10-CM | POA: Diagnosis not present

## 2020-10-02 DIAGNOSIS — M530 Cervicocranial syndrome: Secondary | ICD-10-CM | POA: Diagnosis not present

## 2020-10-03 ENCOUNTER — Telehealth: Payer: Self-pay | Admitting: Licensed Clinical Social Worker

## 2020-10-03 NOTE — Telephone Encounter (Signed)
LCSW called to check in with patient as discussed in appointment in 10/01/20. Patient reports that she is okay and voices that symptoms have not worsened. Patient denies any concerns about her mood. Patient has not followed up with psychiatry but agrees to do so today. LCSW confirmed follow up appt. For 10/08/20.

## 2020-10-08 ENCOUNTER — Ambulatory Visit: Payer: Medicaid Other | Admitting: Licensed Clinical Social Worker

## 2020-10-08 DIAGNOSIS — F3181 Bipolar II disorder: Secondary | ICD-10-CM

## 2020-10-08 DIAGNOSIS — F411 Generalized anxiety disorder: Secondary | ICD-10-CM

## 2020-10-08 NOTE — Progress Notes (Signed)
Counselor/Therapist Progress Note  [name], MRN: [id number],    Date: 10/08/2020  Time Spent: 48 minutes    Treatment Type: Individual Therapy  Reported Symptoms: mild depressive symptoms, low mood, overwhelm, anxiety, anxious thoughts, irritability; denies any hallucinations  Mental Status Exam:  Appearance:   Casual     Behavior:  Appropriate and Sharing  Motor:  Normal  Speech/Language:   Normal Rate  Affect:  Appropriate, Congruent and Full Range  Mood:  normal  Thought process:  normal  Thought content:    WNL  Sensory/Perceptual disturbances:    WNL  Orientation:  oriented to person, place, time/date and situation  Attention:  Good  Concentration:  Good  Memory:  WNL  Fund of knowledge:   Good  Insight:    Good  Judgment:   Good  Impulse Control:  Good   Risk Assessment: Danger to Self:  No Self-injurious Behavior: No Danger to Others: No Duty to Warn:no Physical Aggression / Violence:No  Access to Firearms a concern: No  Gang Involvement:No   Subjective: Patient was engaged and cooperative throughout the session using time effectively to discuss thoughts and feelings. Patient voices continued motivation for treatment and understanding of mood and anxiety issues. Patient is likely to benefit from future treatment because she  remains motivated to decrease mood and anxiety issues and reports benefit of regular sessions in addressing these symptoms in combination with medication management. Patient reports she is taking meds consistently and denies any auditory or visual hallucinations.   Interventions: Cognitive Behavioral Therapy and Mindfulness Meditation Established psychological safety. Checked in with patient regarding her week. Provided resourse information regarding post partum depression for dads. Provided supportive space encouraging emotional release and processing of current psychosocial stressors. Reviewed mindfulness, and taught patient  mindfulness exercise using 5 senses to be used throughout daily activities.  Provided support through active listening, validation of feelings, and highlighted patient's strengths.   Diagnosis:   ICD-10-CM   1. Bipolar 2 disorder (HCC)  F31.81   2. Generalized anxiety disorder  F41.1     Plan: Patient's goal QH:UTML with anxiety and working through things.She reports benefit from having someone to listen to her.  Treatment Target: Understand the relationship between thoughts, emotions, and behaviors   Psychoeducation on CBT model   Teach the connection between thoughts, emotions, and behaviors   Treatment Target: Increase realistic balanced thinking   Explore patients thoughts, beliefs, automatic thoughts, assumptions   Identify unhelpful thinking patterns   Process distress and allow for emotional release   Questioning and challenging thoughts  Cognitive reappraisal   Treatment Target: Increase coping skills  Mindfulness practices   Deep breathing   Teach distress tolerance techniques -what helps me  STOP technique  Future Appointments  Date Time Provider Department Center  10/22/2020  9:00 AM Kathreen Cosier, LCSW [facility] None    Interpreter used: NA  Kathreen Cosier, LCSW

## 2020-10-09 DIAGNOSIS — R42 Dizziness and giddiness: Secondary | ICD-10-CM | POA: Diagnosis not present

## 2020-10-09 DIAGNOSIS — M9901 Segmental and somatic dysfunction of cervical region: Secondary | ICD-10-CM | POA: Diagnosis not present

## 2020-10-09 DIAGNOSIS — M9900 Segmental and somatic dysfunction of head region: Secondary | ICD-10-CM | POA: Diagnosis not present

## 2020-10-09 DIAGNOSIS — M530 Cervicocranial syndrome: Secondary | ICD-10-CM | POA: Diagnosis not present

## 2020-10-19 DIAGNOSIS — Z419 Encounter for procedure for purposes other than remedying health state, unspecified: Secondary | ICD-10-CM | POA: Diagnosis not present

## 2020-10-22 ENCOUNTER — Ambulatory Visit: Payer: Medicaid Other | Admitting: Licensed Clinical Social Worker

## 2020-10-28 ENCOUNTER — Other Ambulatory Visit: Payer: Self-pay | Admitting: *Deleted

## 2020-10-28 NOTE — Patient Outreach (Signed)
Care Coordination  10/28/2020  [name] August 15, 1996 [id number]    Medicaid Managed Care   Unsuccessful Outreach Note  10/28/2020 Name: Janet Rich MRN: [id number] DOB: 09/24/1996  Referred by: Patient, No Pcp Per Reason for referral : High Risk Managed Medicaid (Unsuccessful initial outreach)   An unsuccessful telephone outreach was attempted today. The patient was referred to the case management team for assistance with care management and care coordination.   Follow Up Plan: The care management team will reach out to the patient again over the next 7-14 days.   Estanislado Emms RN, BSN [place]  [facility]

## 2020-10-28 NOTE — Patient Instructions (Signed)
Visit Information  Ms. Marvel Plan  - as a part of your Medicaid benefit, you are eligible for care management and care coordination services at no cost or copay. I was unable to reach you by phone today but would be happy to help you with your health related needs. Please feel free to call me @ [phone].   A member of the Managed Medicaid care management team will reach out to you again over the next 7-14 days.   Estanislado Emms RN, BSN [place]  [facility]

## 2020-10-29 ENCOUNTER — Other Ambulatory Visit: Payer: Self-pay

## 2020-10-29 ENCOUNTER — Other Ambulatory Visit: Payer: Medicaid Other | Admitting: *Deleted

## 2020-10-29 ENCOUNTER — Ambulatory Visit: Payer: Medicaid Other | Admitting: Licensed Clinical Social Worker

## 2020-10-29 DIAGNOSIS — Z0000 Encounter for general adult medical examination without abnormal findings: Secondary | ICD-10-CM

## 2020-10-29 NOTE — Patient Instructions (Signed)
Janet Rich,  Thank you for taking time to speak with me today about care coordination and care management services available to you at no cost as part of your Medicaid benefit. These services are voluntary. Our team is available to provide assistance regarding your health care needs at any time. Please do not hesitate to reach out to me if we can be of service to you at any time in the future. [phone]  Estanislado Emms RN, BSN [place]  [facility] RN Care Coordinator

## 2020-10-29 NOTE — Patient Outreach (Signed)
Care Coordination  10/29/2020  Janet Rich [birth year] [id number]   Janet Rich was referred to the [facility] Managed Care High Risk team for assistance with care coordination and care management services. Care coordination/care management services as part of the Medicaid benefit was offered to the patient today. The patient declined assistance offered today.   Janet Rich did not have needs at this time for the [facility]. However, she would like assistance with scheduling a PCP appointment and resetting her Mychart password. Referral for Care Guide placed and Mychart password reset.   Plan: The Medicaid Managed Care High Risk team is available at any time in the future to assist with care coordination/care management services upon referral.   Estanislado Emms RN, BSN [place]  [facility] RN Care Coordinator

## 2020-11-12 DIAGNOSIS — N898 Other specified noninflammatory disorders of vagina: Secondary | ICD-10-CM | POA: Diagnosis not present

## 2020-11-12 DIAGNOSIS — Z113 Encounter for screening for infections with a predominantly sexual mode of transmission: Secondary | ICD-10-CM | POA: Diagnosis not present

## 2020-11-12 DIAGNOSIS — R102 Pelvic and perineal pain: Secondary | ICD-10-CM | POA: Diagnosis not present

## 2020-11-14 DIAGNOSIS — R102 Pelvic and perineal pain: Secondary | ICD-10-CM | POA: Diagnosis not present

## 2020-11-15 DIAGNOSIS — E282 Polycystic ovarian syndrome: Secondary | ICD-10-CM | POA: Diagnosis not present

## 2020-11-15 DIAGNOSIS — R102 Pelvic and perineal pain: Secondary | ICD-10-CM | POA: Diagnosis not present

## 2020-11-19 DIAGNOSIS — Z419 Encounter for procedure for purposes other than remedying health state, unspecified: Secondary | ICD-10-CM | POA: Diagnosis not present

## 2020-12-17 DIAGNOSIS — Z419 Encounter for procedure for purposes other than remedying health state, unspecified: Secondary | ICD-10-CM | POA: Diagnosis not present

## 2021-01-06 ENCOUNTER — Ambulatory Visit: Payer: Medicaid Other | Admitting: Licensed Clinical Social Worker

## 2021-01-06 DIAGNOSIS — F411 Generalized anxiety disorder: Secondary | ICD-10-CM

## 2021-01-06 DIAGNOSIS — F3181 Bipolar II disorder: Secondary | ICD-10-CM

## 2021-01-06 NOTE — Progress Notes (Signed)
Counselor/Therapist Progress Note  [name], MRN: [id number],    Date: 01/07/2021  Time Spent: 55 minutes   Treatment Type: Psychotherapy  Reported Symptoms: Fatigue, Physical aches and pain and sadness, low mood, anxiety, anxious thoughts, worries aboust others evaluating her  Mental Status Exam:  Appearance:   Casual and bed clothes     Behavior:  Appropriate and Sharing  Motor:  Normal  Speech/Language:   Normal Rate  Affect:  Appropriate, Congruent and Full Range  Mood:  dysthymic  Thought process:  normal  Thought content:    WNL  Sensory/Perceptual disturbances:    WNL  Orientation:  oriented to person, place, time/date and situation  Attention:  Good  Concentration:  Fair  Memory:  WNL  Fund of knowledge:   Good  Insight:    Fair  Judgment:   Good  Impulse Control:  Fair   Risk Assessment: Danger to Self:  No Self-injurious Behavior: No Danger to Others: No Duty to Warn:no Physical Aggression / Violence:No  Access to Firearms a concern: No  Gang Involvement:No   Subjective: Patient was engaged and cooperative throughout the session using time  to discuss thoughts and feelings. Patient voices continued motivation for treatment and understanding of mood and anxiety issues. Patient is likely to benefit from future treatment because she is motivated to decrease distress and improve functioning and reports benefit of regular sessions. Patient reports that she is currently taking Latuda and Lamictal and continues to be seen by psychiatry.   Interventions: Cognitive Behavioral Therapy Established psychological safety. Checked in with patient regarding current symptoms and psychosocial stressors. Engaged patient in processing current psychosocial stressors, multiple stressors. Validated patient's feelings of sadness assisting her in identifying origins of these feelings. Reviewed strategies to identify, accept, and manage feelings of sadness and anxiety.  Encouraged patient to continue positive self-affirmations and to download [facility] app. Provided support through active listening, validation of feelings, and highlighted patient's strengths.   Diagnosis:   ICD-10-CM   1. Bipolar 2 disorder (HCC)  F31.81   2. Generalized anxiety disorder  F41.1    Plan:  Patient's goal OF:BPZW with anxiety and working through things.She reports benefit from having someone to listen to her.  Treatment Target: Understand the relationship between thoughts, emotions, and behaviors   Psychoeducation on CBT model   Teach the connection between thoughts, emotions, and behaviors   Treatment Target: Increase realistic balanced thinking   Explore patient's thoughts, beliefs, automatic thoughts, assumptions   Identify unhelpful thinking patterns   Process distress and allow for emotional release   Questioning and challenging thoughts  Cognitive reappraisal   Treatment Target: Increase coping skills  Mindfulness practices   Deep breathing   Teach distress tolerance techniques -"what helps me"  STOP technique  Future Appointments  Date Time Provider Department Center  01/07/2021  1:20 PM Danelle Berry, PA-C [facility]  01/21/2021  5:00 PM Kathreen Cosier, LCSW [facility] None   Kathreen Cosier, LCSW

## 2021-01-07 ENCOUNTER — Other Ambulatory Visit: Payer: Self-pay

## 2021-01-07 ENCOUNTER — Other Ambulatory Visit: Payer: Self-pay | Admitting: Family Medicine

## 2021-01-07 ENCOUNTER — Telehealth (INDEPENDENT_AMBULATORY_CARE_PROVIDER_SITE_OTHER): Payer: Medicaid Other | Admitting: Family Medicine

## 2021-01-07 ENCOUNTER — Encounter: Payer: Self-pay | Admitting: Family Medicine

## 2021-01-07 VITALS — BP 132/81

## 2021-01-07 DIAGNOSIS — G43909 Migraine, unspecified, not intractable, without status migrainosus: Secondary | ICD-10-CM

## 2021-01-07 DIAGNOSIS — R030 Elevated blood-pressure reading, without diagnosis of hypertension: Secondary | ICD-10-CM

## 2021-01-07 MED ORDER — PROPRANOLOL HCL 10 MG PO TABS: 10.0000 mg | ORAL_TABLET | Freq: Two times a day (BID) | ORAL | 1 refills | Status: DC

## 2021-01-07 MED ORDER — SUMATRIPTAN SUCCINATE 25 MG PO TABS: ORAL_TABLET | ORAL | 0 refills | Status: DC

## 2021-01-07 NOTE — Progress Notes (Signed)
Pt had appt today for elevated BP but has not been in clinic in over a year and is new to me.  She  CMA recorded BP of 145-151/90's, recorded earlier today was 130/80's HR 80's    Procardia through pregnancy due to preeclampsia put on and off by OBGYN, stopped in the last couple months She was on 10 mg Q6 hours procardia   Migraines - terrible migraines lately - legs on fire, nausea - not currently   Di-Di 08/09/2020   Plan - treat HA with migraine meds, excedrine (tylenol) OTC tylenol and avoid nsaids Restart low dose procardia  F/up in office for further eval  With BP 140's or so with severe HA/migraines I'm not that concerned about her HTN/BP Likely elevated due to pain, stress etc  She is not currently nursing  Could try propanolol?  Appt tomorrow - discussed need to manage HA, likely monitor BP and follow closely over the next 2-4 weeks.    ICD-10-CM   1. Elevated BP without diagnosis of hypertension  R03.0 propranolol (INDERAL) 10 MG tablet  2. Migraine without status migrainosus, not intractable, unspecified migraine type  G43.909 propranolol (INDERAL) 10 MG tablet    SUMAtriptan (IMITREX) 25 MG tablet   Danelle Berry, PA-C 01/07/21 2:02 PM

## 2021-01-08 ENCOUNTER — Encounter: Payer: Self-pay | Admitting: Family Medicine

## 2021-01-08 ENCOUNTER — Ambulatory Visit: Payer: Medicaid Other | Admitting: Family Medicine

## 2021-01-08 VITALS — BP 120/60 | HR 84 | Temp 98.4°F | Resp 16 | Ht 61.0 in | Wt 206.7 lb

## 2021-01-08 DIAGNOSIS — Z3201 Encounter for pregnancy test, result positive: Secondary | ICD-10-CM | POA: Diagnosis not present

## 2021-01-08 DIAGNOSIS — Z8669 Personal history of other diseases of the nervous system and sense organs: Secondary | ICD-10-CM | POA: Diagnosis not present

## 2021-01-08 DIAGNOSIS — R030 Elevated blood-pressure reading, without diagnosis of hypertension: Secondary | ICD-10-CM | POA: Diagnosis not present

## 2021-01-08 LAB — BASIC METABOLIC PANEL
BUN: 10 mg/dL (ref 7–25)
CO2: 29 mmol/L (ref 20–32)
Calcium: 9.5 mg/dL (ref 8.6–10.2)
Chloride: 99 mmol/L (ref 98–110)
Creat: 0.67 mg/dL (ref 0.50–1.10)
Glucose, Bld: 74 mg/dL (ref 65–99)
Potassium: 4.4 mmol/L (ref 3.5–5.3)
Sodium: 136 mmol/L (ref 135–146)

## 2021-01-08 LAB — POCT URINE PREGNANCY: Preg Test, Ur: NEGATIVE

## 2021-01-08 NOTE — Assessment & Plan Note (Addendum)
Normal in office today. With report of higher measurements only with feeling bad/headaches, suspect moreso due to pain rather than true HTN as she does have normal measurements when not in pain. Will obtain labs today and have patient keep log of BP over the next 4-6 weeks and bring to follow up.

## 2021-01-08 NOTE — Progress Notes (Signed)
   SUBJECTIVE:   CHIEF COMPLAINT / HPI:   Elevated BP: - noted higher Bps, notably when feeling bad, headaches, nausea, clammy. - h/o preE, was on nifedipine during pregnancy off and on during the last month for irritable uterus. Delivered in October 2021. - h/o migraines, Rxed propranolol yesterday thorugh virtual visit for prophylaxis and BP management.  - Medications: propranolol 10mg  BID. - Compliance: hasn't taken propranolol or imitrex yet, concerned she may be pregnant. LMP 12/02/20, OCPs off and on. - Checking BP at home: yes, only when feeling bad.  - denies LE swelling - no FH of HTN.  Migraines - chronic. Bilateral, temporal. Last for few hours. Bad ones about once per month. Sometimes with daily headaches.  Associated with nausea, occasional vomiting. +photophobia, phonophobia. Never been on meds for this in the past. Used to take 800mg  ibuprofen or tramadol (really bad ones) for abortive therapy.    OBJECTIVE:   BP 120/60   Pulse 84   Temp 98.4 F (36.9 C) (Oral)   Resp 16   Ht 5\' 1"  (1.549 m)   Wt 206 lb 11.2 oz (93.8 kg)   SpO2 98%   BMI 39.06 kg/m   Gen: well appearing, in NAD Card: Reg rate Lungs: Comfortable WOB on RA Ext: WWP  ASSESSMENT/PLAN:   Elevated BP without diagnosis of hypertension Normal in office today. With report of higher measurements only with feeling bad/headaches, suspect moreso due to pain rather than true HTN as she does have normal measurements when not in pain. Will obtain labs today and have patient keep log of BP over the next 4-6 weeks and bring to follow up.   History of migraine headaches Upreg negative today. Ok to take imitrex prn and start propranolol for prophylaxis given low dose and multiple episodes per month. F/u in 4-6 weeks. At that time, would assess need for keeping headache diary to look for possible associations though appears to be stress related.      12/04/20, DO

## 2021-01-08 NOTE — Assessment & Plan Note (Addendum)
Upreg negative today. Ok to take imitrex prn and start propranolol for prophylaxis given low dose and multiple episodes per month. F/u in 4-6 weeks. At that time, would assess need for keeping headache diary to look for possible associations though appears to be stress related.

## 2021-01-08 NOTE — Patient Instructions (Signed)
It was great to see you!  Our plans for today:  - Your blood pressure is normal today. Keep an eye on this over the next few weeks and keep a log of your measurements.  - We are checking some labs today, we will release these results to your MyChart. - come back in 4-6 weeks for follow up and bring your log with you.   Take care and seek immediate care sooner if you develop any concerns.   Dr. Linwood Dibbles

## 2021-01-16 NOTE — Progress Notes (Signed)
Appt changed

## 2021-01-17 DIAGNOSIS — Z419 Encounter for procedure for purposes other than remedying health state, unspecified: Secondary | ICD-10-CM | POA: Diagnosis not present

## 2021-01-21 ENCOUNTER — Ambulatory Visit: Payer: Medicaid Other | Admitting: Licensed Clinical Social Worker

## 2021-01-21 DIAGNOSIS — M25511 Pain in right shoulder: Secondary | ICD-10-CM | POA: Diagnosis not present

## 2021-01-21 DIAGNOSIS — F3181 Bipolar II disorder: Secondary | ICD-10-CM

## 2021-01-21 DIAGNOSIS — S4351XA Sprain of right acromioclavicular joint, initial encounter: Secondary | ICD-10-CM | POA: Diagnosis not present

## 2021-01-21 DIAGNOSIS — F411 Generalized anxiety disorder: Secondary | ICD-10-CM

## 2021-01-21 NOTE — Progress Notes (Signed)
Counselor/Therapist Progress Note  [name], MRN: [id number],    Date: 01/21/2021  Time Spent: 40 minutes   Treatment Type: Psychotherapy  Reported Symptoms: Physical aches and pain and overall static mood symptpms   Mental Status Exam:  Appearance:   Casual     Behavior:  Appropriate and Sharing  Motor:  Normal  Speech/Language:   Clear and Coherent and Normal Rate  Affect:  Appropriate and Congruent  Mood:  euthymic  Thought process:  normal  Thought content:    WNL  Sensory/Perceptual disturbances:    WNL  Orientation:  oriented to person, place, time/date and situation  Attention:  Fair  Concentration:  Fair  Memory:  WNL  Fund of knowledge:   Good  Insight:    Good  Judgment:   Good  Impulse Control:  Good   Risk Assessment: Danger to Self:  No Self-injurious Behavior: No Danger to Others: No Duty to Warn:no Physical Aggression / Violence:No  Access to Firearms a concern: No  Gang Involvement:No   Subjective: Patient was participated throughout the session using time to discuss thoughts and feelings. Patient voices continued motivation for treatment and understanding of the need to use coping skills to manage mood and anxiety symptoms and reports currently reading a book to help with coping. Patient is likely to benefit from future treatment because she remains motivated to decrease symptoms and improve functioning and reports benefit of regular sessions in addressing these symptoms in combination with Lamictal and Latuda.   Interventions: Cognitive Behavioral Therapy and Triple P Parenting  Established psychological safety. Checked in with patient regarding her week. Provided supportive space encouraging emotional release and processing of current psychosocial stressors. Explored patient's current strategies to manage anxiety. Discussed parenting challenges sharing info on Triple P. Encouraged patient to use Triple P online. Provided support through  active listening, validation of feelings, and highlighted patient's strengths.   Diagnosis:   ICD-10-CM   1. Generalized anxiety disorder  F41.1   2. Mild bipolar II disorder, most recent episode major depressive (HCC)  F31.81    Plan: Patient's goal KD:TOIZ with anxiety and working through things.She reports benefit from having someone to listen to her.  Treatment Target: Understand the relationship between thoughts, emotions, and behaviors   Psychoeducation on CBT model   Teach the connection between thoughts, emotions, and behaviors  Treatment Target: Increase realistic balanced thinking   Explore patient's thoughts, beliefs, automatic thoughts, assumptions   Identify unhelpful thinking patterns   Process distress and allow for emotional release   Questioning and challenging thoughts  Cognitive reappraisal  Treatment Target: Increase coping skills  Mindfulness practices   Deep breathing   Teach distress tolerance techniques -"what helps me"  STOP technique  Treatment Target: Increase knowledge of child development and positive parenting practices   Through use of Triple P, increase parenting confidence and build awareness of child development, and increase parenting skills - Provide psychoeducation on Triple P positive parenting  - Explore current parenting strategies  - Provide opportunity to explore interventions from Triple P that can support patient in effective parenting practices - Parenting coaching and modeling for developing positive relationship with patient, encouraging desirable behavior, developing clear boundaries and behavioral expectations, providing effective commands and managing misbehavior.  - Teach behavior management techniques from Triple P to reduce negative behaviors, including consistency, age-appropriate expectations, prompting positive behaviors, using praise and rewards, using clear instructions, and natural consequences to help with  listening.  Future Appointments  Date Time  Provider Department Center  02/05/2021  1:20 PM Trey Sailors, PA-C [facility]   Kathreen Cosier, [place]

## 2021-01-28 DIAGNOSIS — M25511 Pain in right shoulder: Secondary | ICD-10-CM | POA: Diagnosis not present

## 2021-02-03 DIAGNOSIS — M65871 Other synovitis and tenosynovitis, right ankle and foot: Secondary | ICD-10-CM | POA: Diagnosis not present

## 2021-02-03 DIAGNOSIS — M25871 Other specified joint disorders, right ankle and foot: Secondary | ICD-10-CM | POA: Diagnosis not present

## 2021-02-03 DIAGNOSIS — M7671 Peroneal tendinitis, right leg: Secondary | ICD-10-CM | POA: Diagnosis not present

## 2021-02-03 DIAGNOSIS — S92254D Nondisplaced fracture of navicular [scaphoid] of right foot, subsequent encounter for fracture with routine healing: Secondary | ICD-10-CM | POA: Diagnosis not present

## 2021-02-04 ENCOUNTER — Ambulatory Visit: Payer: Medicaid Other | Admitting: Licensed Clinical Social Worker

## 2021-02-04 DIAGNOSIS — F411 Generalized anxiety disorder: Secondary | ICD-10-CM

## 2021-02-04 DIAGNOSIS — F3181 Bipolar II disorder: Secondary | ICD-10-CM

## 2021-02-04 NOTE — Progress Notes (Signed)
Counselor/Therapist Progress Note  [name], MRN: [id number],    Date: 02/04/2021  Time Spent: 15 minutes patient had to reschedule  Treatment Type: Psychotherapy  Reported Symptoms: Anxiety, anxious thoughts and worries, overwhelm  Mental Status Exam:  Appearance:   Casual     Behavior:  Appropriate and Sharing  Motor:  Normal  Speech/Language:   Clear and Coherent and Normal Rate  Affect:  Appropriate and Congruent  Mood:  anxious  Thought process:  goal directed  Thought content:    WNL  Sensory/Perceptual disturbances:    WNL  Orientation:  oriented to person, place, time/date and situation  Attention:  Good  Concentration:  Good  Memory:  WNL  Fund of knowledge:   Good  Insight:    Good  Judgment:   Good  Impulse Control:  Good   Risk Assessment: Danger to Self:  No Self-injurious Behavior: No Danger to Others: No Duty to Warn:no Physical Aggression / Violence:No  Access to Firearms a concern: No  Gang Involvement:No   Subjective: Patient ended session early due to "forgetting" about the appointment. During brief session she was engaged and cooperative using time to discuss thoughts and feelings. Patient voices continued motivation for treatment and understanding of anxiety and mood issues. Patient shares she has a plan of self-care this evening that she reports will help her mood. Patient is likely to benefit from future treatment because she remains motivated to decrease symptoms and improve functioning.   Interventions: Cognitive Behavioral Therapy Established psychological safety. Checked in with patient regarding her week. Engaged patient in processing current psychosocial stressors, multiple stressors leading to significant increase in anxiety. Validated patient's feelings of anxiety. Discussed self-care. Provided support through active listening, validation of feelings, and highlighted patient's strengths.   Diagnosis:   ICD-10-CM   1.  Generalized anxiety disorder  F41.1   2. Mild bipolar II disorder, most recent episode major depressive (HCC)  F31.81    Plan: Patient's goal YH:CWCB with anxiety and working through things.She reports benefit from having someone to listen to her.  Treatment Target: Understand the relationship between thoughts, emotions, and behaviors   Psychoeducation on CBT model   Teach the connection between thoughts, emotions, and behaviors  Treatment Target: Increase realistic balanced thinking   Explore patient's thoughts, beliefs, automatic thoughts, assumptions   Identify unhelpful thinking patterns   Process distress and allow for emotional release   Questioning and challenging thoughts  Cognitive reappraisal  Treatment Target: Increase coping skills  Mindfulness practices   Deep breathing   Teach distress tolerance techniques -"what helps me"  STOP technique  Treatment Target: Increase knowledge of child development and positive parenting practices   Through use of Triple P, increase parenting confidence and build awareness of child development, and increase parenting skills  Provide psychoeducation on Triple P positive parenting   Explore current parenting strategies   Provide opportunity to explore interventions from Triple P that can support patient in effective parenting practices  Parenting coaching and modeling for developing positive relationship with patient, encouraging desirable behavior, developing clear boundaries and behavioral expectations, providing effective commands and managing misbehavior.   Teach behavior management techniques from Triple P to reduce negative behaviors, including consistency, age-appropriate expectations, prompting positive behaviors, using praise and rewards, using clear instructions, and natural consequences to help with listening.  Future Appointments  Date Time Provider Department Center  02/11/2021  2:00 PM Kathreen Cosier,  LCSW [facility] None   Kathreen Cosier, [place]

## 2021-02-05 ENCOUNTER — Ambulatory Visit: Payer: Medicaid Other | Admitting: Physician Assistant

## 2021-02-07 IMAGING — MR MR ANKLE*R* W/O CM
5 series · 40 of 40 positions shown · non-contrast
Comparison: Right ankle x-rays dated March 01, 2015.

CLINICAL DATA: Chronic right ankle pain and weakness.

EXAM:
MRI OF THE RIGHT ANKLE WITHOUT CONTRAST
TECHNIQUE: Multiplanar, multisequence MR imaging of the ankle was performed. No
intravenous contrast was administered.

[Series 3: PD fat-sat · axial · right · 3.0mm · 0.50mm/px · z∈[-119,+21]mm · 10 of 36 slices shown]
[im 1/36]
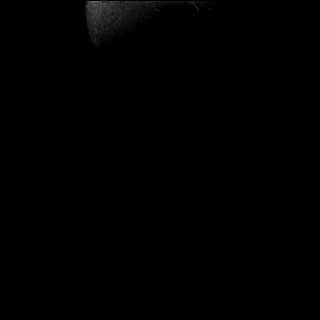
[im 4/36]
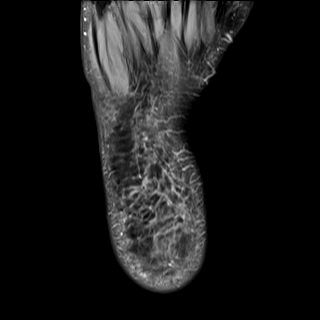
[im 8/36]
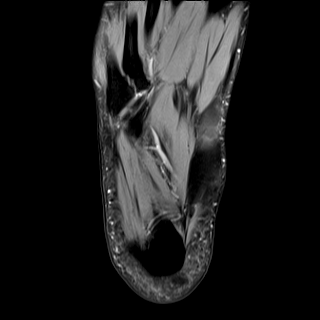
[im 12/36]
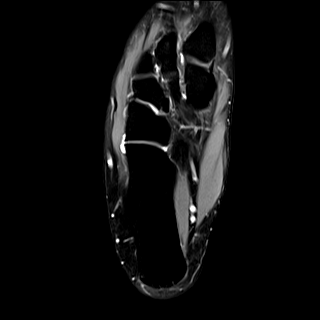
[im 16/36]
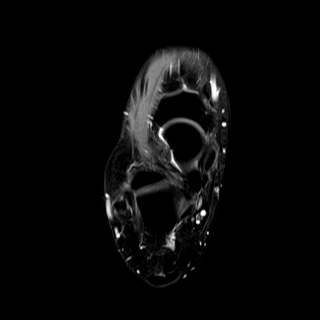
[im 20/36]
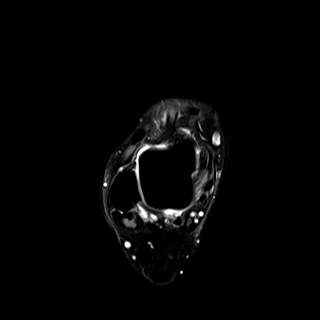
[im 24/36]
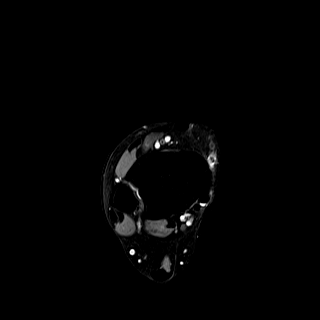
[im 28/36]
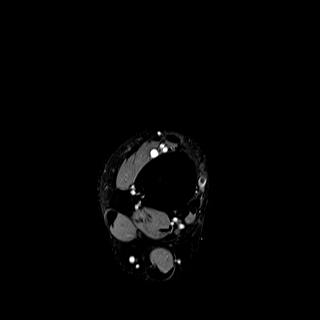
[im 32/36]
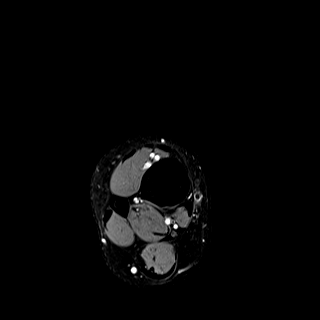
[im 36/36]
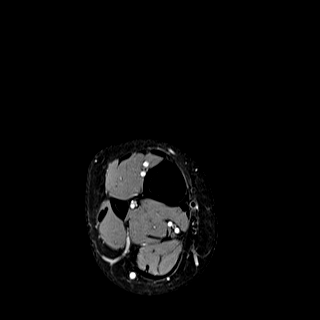

[Series 4: T2 fat-sat · axial · right · 3.0mm · 0.50mm/px · z∈[-119,+21]mm · 10 of 36 slices shown (1 of 2)]
[im 1/36]
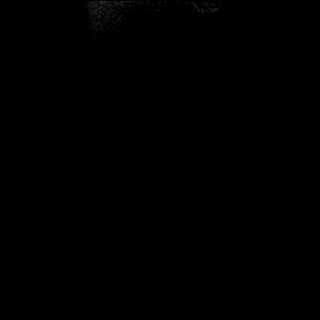
[im 4/36]
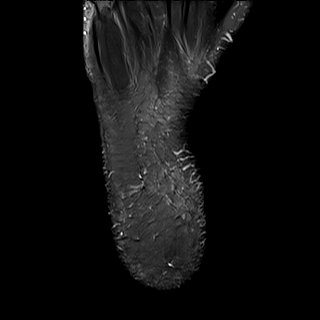
[im 8/36]
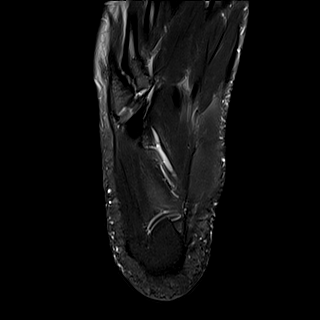
[im 12/36]
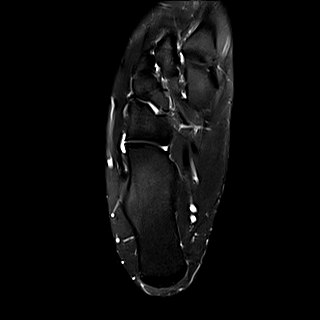
[im 16/36]
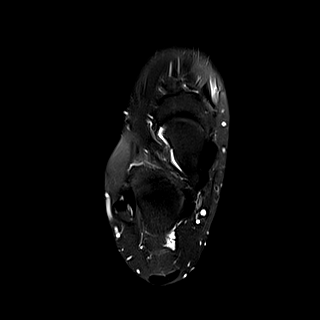
[im 20/36]
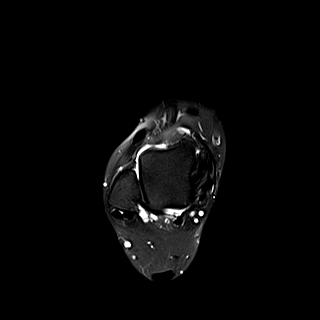
[im 24/36]
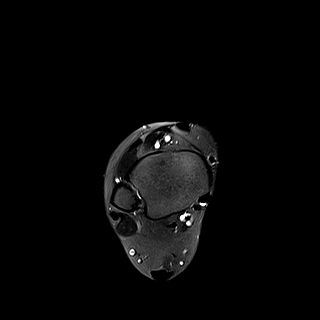
[im 28/36]
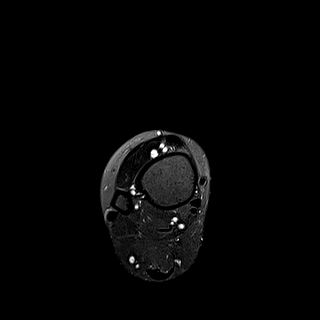
[im 32/36]
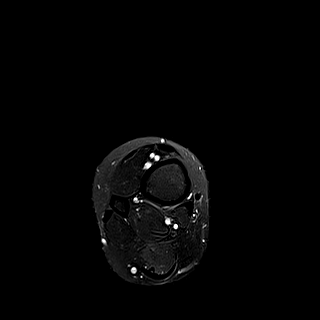
[im 36/36]
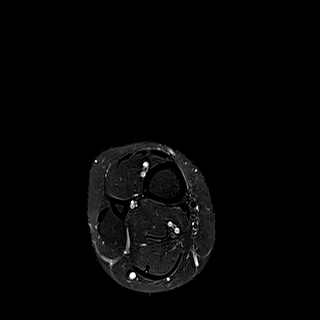

[Series 5: T2 fat-sat · coronal · right · 3.0mm · 0.62mm/px · 10 of 40 slices shown (2 of 2)]
[im 1/40]
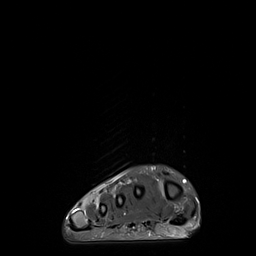
[im 5/40]
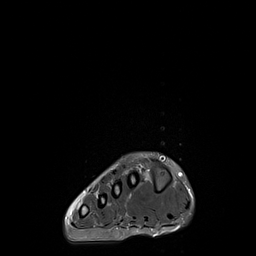
[im 9/40]
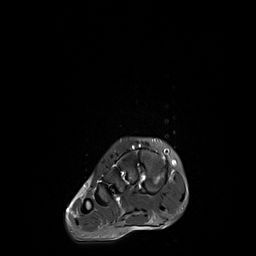
[im 14/40]
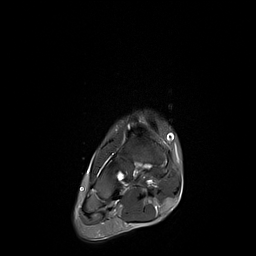
[im 18/40]
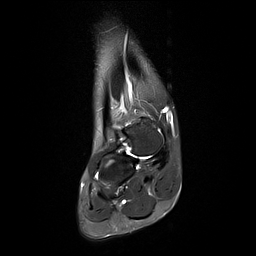
[im 22/40]
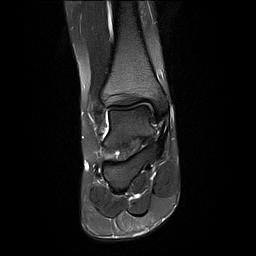
[im 27/40]
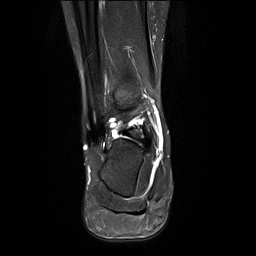
[im 31/40]
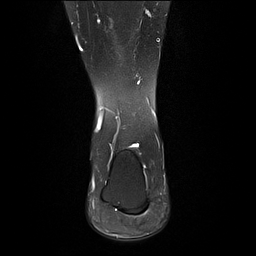
[im 35/40]
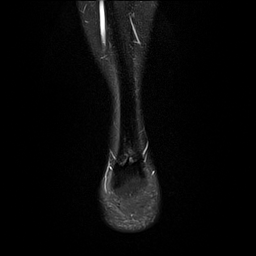
[im 40/40]
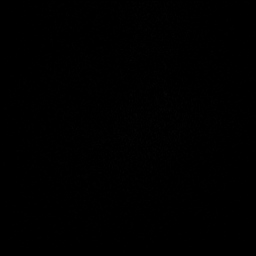

[Series 6: T1 · sagittal · right · 4.0mm · 0.70mm/px · 5 of 21 slices shown]
[im 1/21]
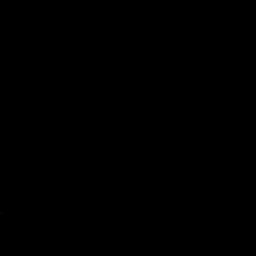
[im 6/21]
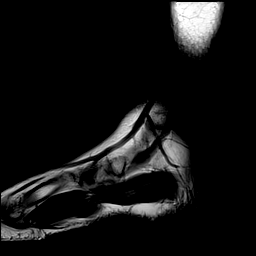
[im 11/21]
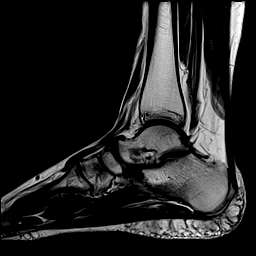
[im 16/21]
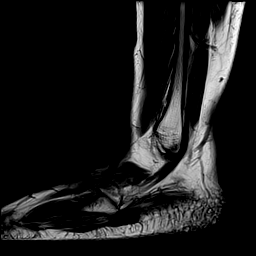
[im 21/21]
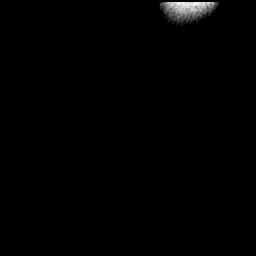

[Series 7: STIR · sagittal · right · 4.0mm · 0.35mm/px · 5 of 21 slices shown]
[im 1/21]
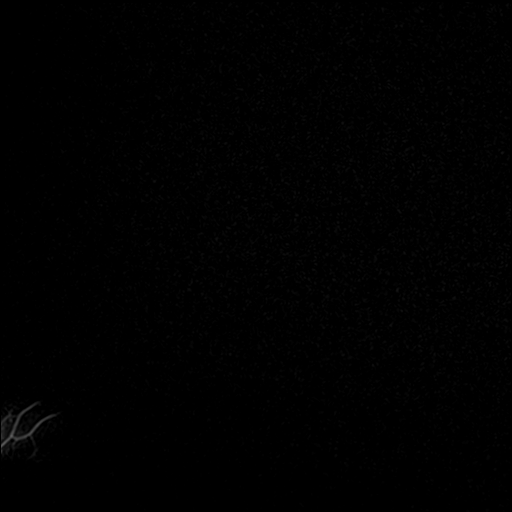
[im 6/21]
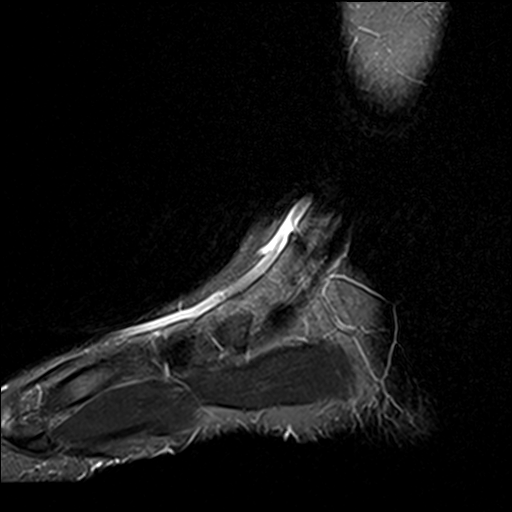
[im 11/21]
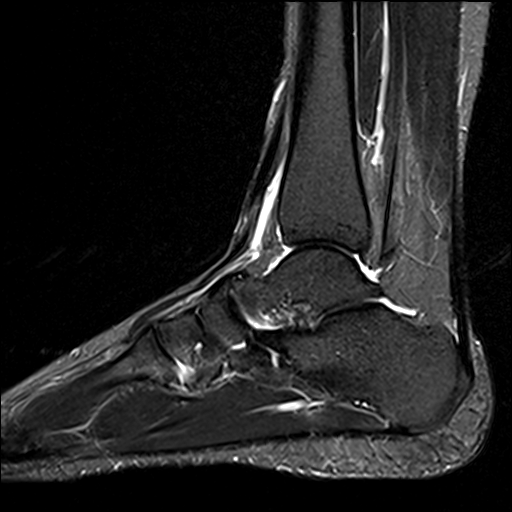
[im 16/21]
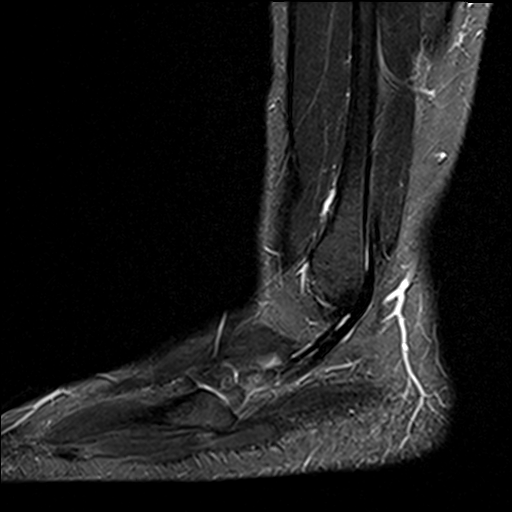
[im 21/21]
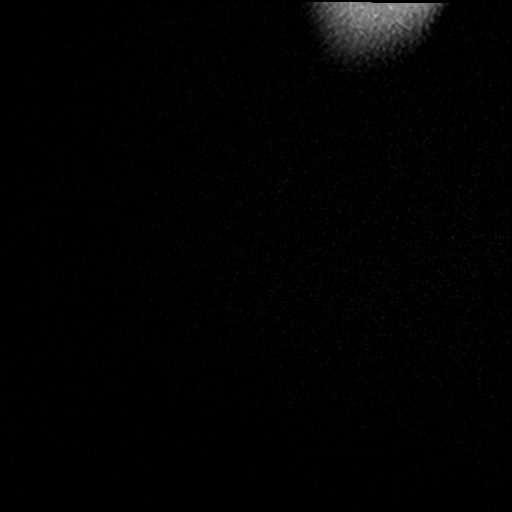

[40 of 40 positions shown; findings below may reference images not displayed]

FINDINGS: TENDONS

Peroneal: Peroneal longus tendon intact. Peroneal brevis intact.

Posteromedial: Posterior tibial tendon intact. Flexor hallucis
longus tendon intact. Flexor digitorum longus tendon intact.

Anterior: Tibialis anterior tendon intact. Extensor hallucis longus
tendon intact Extensor digitorum longus tendon intact.

Achilles:  Intact.

Plantar Fascia: Intact.

LIGAMENTS

Lateral: Anterior talofibular ligament is chronically torn.
Calcaneofibular ligament intact. Posterior talofibular ligament
intact. Anterior and posterior tibiofibular ligaments intact.

Medial: Deltoid ligament intact. Spring ligament intact.

CARTILAGE

Ankle Joint: No joint effusion. Normal ankle mortise. No chondral
defect.

Subtalar Joints/Sinus Tarsi: Normal subtalar joints. No subtalar
joint effusion. Normal sinus tarsi.

Bones: No marrow signal abnormality.  No fracture or dislocation.

Soft Tissue: No soft tissue mass or fluid collection.
IMPRESSION: 1. No acute abnormality.
2. Chronic complete tear of the anterior talofibular ligament.

## 2021-02-10 ENCOUNTER — Ambulatory Visit
Admission: EM | Admit: 2021-02-10 | Discharge: 2021-02-10 | Disposition: A | Discharge status: Home / Self Care | Payer: Medicaid Other | Attending: Physician Assistant | Admitting: Physician Assistant

## 2021-02-10 ENCOUNTER — Encounter: Payer: Self-pay | Admitting: Emergency Medicine

## 2021-02-10 ENCOUNTER — Other Ambulatory Visit: Payer: Self-pay

## 2021-02-10 DIAGNOSIS — B373 Candidiasis of vulva and vagina: Secondary | ICD-10-CM | POA: Diagnosis not present

## 2021-02-10 DIAGNOSIS — B3731 Acute candidiasis of vulva and vagina: Secondary | ICD-10-CM

## 2021-02-10 DIAGNOSIS — R300 Dysuria: Secondary | ICD-10-CM | POA: Diagnosis not present

## 2021-02-10 LAB — URINALYSIS, COMPLETE (UACMP) WITH MICROSCOPIC
Bilirubin Urine: NEGATIVE
Glucose, UA: NEGATIVE mg/dL
Ketones, ur: NEGATIVE mg/dL
Leukocytes,Ua: NEGATIVE
Nitrite: NEGATIVE
Specific Gravity, Urine: 1.02 (ref 1.005–1.030)
pH: 6.5 (ref 5.0–8.0)

## 2021-02-10 LAB — WET PREP, GENITAL
Clue Cells Wet Prep HPF POC: NONE SEEN
Sperm: NONE SEEN
Trich, Wet Prep: NONE SEEN

## 2021-02-10 MED ORDER — MICONAZOLE NITRATE 2 % EX CREA: 1.0000 "application " | TOPICAL_CREAM | Freq: Two times a day (BID) | CUTANEOUS | 0 refills | Status: DC

## 2021-02-10 MED ORDER — FLUCONAZOLE 150 MG PO TABS: ORAL_TABLET | ORAL | 0 refills | Status: DC

## 2021-02-10 NOTE — ED Provider Notes (Signed)
[facility]    CSN: [id number] Arrival date & time: 02/10/21  1811      History   Chief Complaint Chief Complaint  Patient presents with  . Vaginal Itching    HPI Janet Rich is a 25 y.o. female presenting for 4-day history of vaginal itching and thick white vaginal discharge.  Denies any odor.  She has had some dysuria as well.  Patient has taken over-the-counter Monistat 1 without any improvement in her symptoms.  She denies any concern for STIs.  Patient denies any urinary frequency urgency.  No hematuria.  No other complaints or concerns.  HPI  Past Medical History:  Diagnosis Date  . Anemia    with periods  . Ankle fracture    right  . Anxiety   . Asthma   . Back pain   . Depression   . Endometriosis   . GERD (gastroesophageal reflux disease)   . Headache   . Ovarian cyst   . PCOS (polycystic ovarian syndrome)   . Pneumonia    age 49  . PONV (postoperative nausea and vomiting)    nausea after wisdom teeth extraction  . Vaginal Pap smear, abnormal    bx ok    Patient Active Problem List   Diagnosis Date Noted  . Elevated BP without diagnosis of hypertension 01/08/2021  . Dichorionic diamniotic twin pregnancy in third trimester 08/14/2020  . Preeclampsia 08/14/2020  . Twin pregnancy delivered vaginally 08/14/2020  . Preterm labor 06/17/2020  . Right trigeminal neuralgia 11/08/2019  . Endometriosis 03/27/2019  . PCOS (polycystic ovarian syndrome) 03/27/2019  . Moderate persistent asthma 03/07/2019  . Post-nasal drainage 03/07/2019  . Irritable bowel syndrome with diarrhea 03/07/2019  . Gastroesophageal reflux disease without esophagitis 03/07/2019  . Class 2 obesity due to excess calories without serious comorbidity with body mass index (BMI) of 38.0 to 38.9 in adult 09/02/2018  . Bipolar 2 disorder (HCC) 08/12/2018  . History of low birth weight 03/11/2018  . Vulvar atrophy 03/11/2018  . History of migraine headaches 05/01/2015  . Anxiety  and depression 05/01/2015    Past Surgical History:  Procedure Laterality Date  . FRACTURE SURGERY     ankle surgery with impant   . HYSTEROSCOPY WITH D & C N/A 09/22/2019   Procedure: DILATATION AND CURETTAGE /HYSTEROSCOPY;  Surgeon: Ward, Elenora Fender, MD;  Location: [facility];  Service: Gynecology;  Laterality: N/A;  . ROBOTIC ASSISTED DIAGNOSTIC LAPAROSCOPY N/A 02/18/2017   Procedure: ROBOTIC ASSISTED DIAGNOSTIC LAPAROSCOPY,EXCISION AND ABLATION OF ENDOMETRIOSIS,LYSIS OF ADHESIONS;  Surgeon: Olivia Mackie, MD;  Location: [facility];  Service: Gynecology;  Laterality: N/A;  . VAGINAL DELIVERY N/A 08/14/2020   Procedure: VAGINAL DELIVERY;  Surgeon: Feliberto Gottron Ihor Austin, MD;  Location: [facility];  Service: Obstetrics;  Laterality: N/A;  . WISDOM TOOTH EXTRACTION  2013    OB History    Gravida  2   Para  2   Term  1   Preterm  1   AB  0   Living  3     SAB  0   IAB  0   Ectopic  0   Multiple  1   Live Births  3            Home Medications    Prior to Admission medications   Medication Sig Start Date End Date Taking? Authorizing Provider  albuterol (PROVENTIL HFA;VENTOLIN HFA) 108 (90 Base) MCG/ACT inhaler Inhale 1-2 puffs into the lungs every 6 (six) hours as needed  for wheezing or shortness of breath. 11/19/18  Yes Domenick Gong, MD  budesonide-formoterol [facility]) 80-4.5 MCG/ACT inhaler Inhale 2 puffs into the lungs 2 (two) times daily. 06/23/19  Yes Doren Custard, FNP  fluconazole (DIFLUCAN) 150 MG tablet Take 1 tab PO today and repeat in 72 hours if needed 02/10/21  Yes Shirlee Latch, PA-C  lamoTRIgine (LAMICTAL) 150 MG tablet Take 200 mg by mouth daily. 08/22/20  Yes [provider]  Prenatal Vit-Fe Fumarate-FA (PRENATAL MULTIVITAMIN) TABS tablet Take 2 tablets by mouth daily.   Yes [provider]  acetaminophen (TYLENOL) 500 MG tablet Take 2 tablets (1,000 mg total) by mouth every 6 (six) hours as needed for moderate pain. 07/11/20   McVey,  Prudencio Pair, CNM  aspirin 81 MG chewable tablet Chew by mouth.    [provider]  ferrous sulfate 325 (65 FE) MG tablet Take by mouth.    [provider]  folic acid (FOLVITE) 1 MG tablet Take by mouth. 02/16/20 02/15/21  [provider]  ibuprofen (ADVIL) 600 MG tablet Take 1 tablet (600 mg total) by mouth every 6 (six) hours as needed. 08/16/20   Gustavo Lah, CNM  lurasidone (LATUDA) 20 MG TABS tablet Take by mouth.    [provider]  miconazole (MICOTIN) 2 % cream Apply 1 application topically 2 (two) times daily. 02/10/21   Shirlee Latch, PA-C  propranolol (INDERAL) 10 MG tablet Take 1 tablet (10 mg total) by mouth 2 (two) times daily. 01/07/21   Danelle Berry, PA-C  SUMAtriptan (IMITREX) 25 MG tablet Take 25-50 mg by mouth once at the onset of headache, may repeat dose 1-2 hours if still symptomatic  Max dose in 24 hours is 200 mg 01/07/21   Danelle Berry, PA-C    Family History Family History  Problem Relation Age of Onset  . Endometriosis Mother   . Bipolar disorder Mother   . Diabetes Mother        prediabetes  . Breast cancer Paternal Grandmother   . Cancer Paternal Grandmother   . Depression Paternal Grandmother   . Schizophrenia Father   . Depression Father   . Heart disease Neg Hx   . Colon cancer Neg Hx   . Ovarian cancer Neg Hx     Social History Social History   Tobacco Use  . Smoking status: Former Smoker    Types: Cigarettes    Quit date: 12/20/2009    Years since quitting: 11.1  . Smokeless tobacco: Never Used  . Tobacco comment: age [age], quit  Vaping Use  . Vaping Use: Never used  Substance Use Topics  . Alcohol use: Not Currently  . Drug use: No     Allergies   Amoxicillin, Other, and Latex   Review of Systems Review of Systems  Constitutional: Negative for fatigue and fever.  Gastrointestinal: Negative for abdominal pain, nausea and vomiting.  Genitourinary: Positive for dysuria and vaginal discharge.  Negative for flank pain, frequency, hematuria, urgency, vaginal bleeding and vaginal pain.  Musculoskeletal: Positive for back pain.  Skin: Negative for rash.     Physical Exam Triage Vital Signs ED Triage Vitals  Enc Vitals Group     BP 02/10/21 1902 (!) 130/98     Pulse Rate 02/10/21 1902 87     Resp 02/10/21 1902 18     Temp 02/10/21 1902 98.3 F (36.8 C)     Temp Source 02/10/21 1902 Oral     SpO2 02/10/21 1902 100 %  Weight 02/10/21 1900 206 lb 12.7 oz (93.8 kg)     Height 02/10/21 1900 5\' 1"  (1.549 m)     Head Circumference --      Peak Flow --      Pain Score 02/10/21 1859 2     Pain Loc --      Pain Edu? --      Excl. in GC? --    No data found.  Updated Vital Signs BP (!) 130/98 (BP Location: Right Arm)   Pulse 87   Temp 98.3 F (36.8 C) (Oral)   Resp 18   Ht 5\' 1"  (1.549 m)   Wt 206 lb 12.7 oz (93.8 kg)   LMP 01/20/2021 (Approximate)   SpO2 100%   Breastfeeding No   BMI 39.07 kg/m       Physical Exam Vitals and nursing note reviewed.  Constitutional:      General: She is not in acute distress.    Appearance: Normal appearance. She is not ill-appearing or toxic-appearing.  HENT:     Head: Normocephalic and atraumatic.  Eyes:     General: No scleral icterus.       Right eye: No discharge.        Left eye: No discharge.     Conjunctiva/sclera: Conjunctivae normal.  Cardiovascular:     Rate and Rhythm: Normal rate and regular rhythm.     Heart sounds: Normal heart sounds.  Pulmonary:     Effort: Pulmonary effort is normal. No respiratory distress.     Breath sounds: Normal breath sounds.  Abdominal:     Palpations: Abdomen is soft.     Tenderness: There is no abdominal tenderness. There is no right CVA tenderness or left CVA tenderness.  Musculoskeletal:     Cervical back: Neck supple.  Skin:    General: Skin is dry.  Neurological:     General: No focal deficit present.     Mental Status: She is alert. Mental status is at baseline.      Motor: No weakness.     Gait: Gait normal.  Psychiatric:        Mood and Affect: Mood normal.        Behavior: Behavior normal.        Thought Content: Thought content normal.      UC Treatments / Results  Labs (all labs ordered are listed, but only abnormal results are displayed) Labs Reviewed  WET PREP, GENITAL - Abnormal; Notable for the following components:      Result Value   Yeast Wet Prep HPF POC PRESENT (*)    WBC, Wet Prep HPF POC FEW (*)    All other components within normal limits  URINALYSIS, COMPLETE (UACMP) WITH MICROSCOPIC - Abnormal; Notable for the following components:   Hgb urine dipstick TRACE (*)    Protein, ur TRACE (*)    Bacteria, UA FEW (*)    All other components within normal limits  URINE CULTURE    EKG   Radiology No results found.  Procedures Procedures (including critical care time)  Medications Ordered in UC Medications - No data to display  Initial Impression / Assessment and Plan / UC Course  I have reviewed the triage vital signs and the nursing notes.  Pertinent labs & imaging results that were available during my care of the patient were reviewed by me and considered in my medical decision making (see chart for details).   Wet prep positive for yeast.  Urinalysis not  consistent with UTI.  We will send urine for culture and treat for UTI if culture grows bacteria.  Treating her yeast infection at this time with fluconazole and miconazole topical.  Advised someone will contact her she needs to start antibiotic for a UTI but doubtful based on the day.  Reviewed ED red flag signs and symptoms.   Final Clinical Impressions(s) / UC Diagnoses   Final diagnoses:  Yeast vaginitis  Dysuria   Discharge Instructions   None    ED Prescriptions    Medication Sig Dispense Auth. Provider   fluconazole (DIFLUCAN) 150 MG tablet Take 1 tab PO today and repeat in 72 hours if needed 2 tablet Eusebio Friendly B, PA-C   miconazole (MICOTIN) 2 %  cream  (Status: Discontinued) Apply 1 application topically 2 (two) times daily. 28.35 g Eusebio Friendly B, PA-C   miconazole (MICOTIN) 2 % cream Apply 1 application topically 2 (two) times daily. 28.35 g Shirlee Latch, PA-C     PDMP not reviewed this encounter.   Shirlee Latch, PA-C 02/10/21 1951

## 2021-02-10 NOTE — ED Triage Notes (Signed)
Pt c/o vaginal itching, burning, and thick white discharge. Started about a month ago, then got better but came back about 4 days ago worse. She states there is no concern for STDs.

## 2021-02-11 ENCOUNTER — Ambulatory Visit: Payer: Medicaid Other | Admitting: Licensed Clinical Social Worker

## 2021-02-11 DIAGNOSIS — F3181 Bipolar II disorder: Secondary | ICD-10-CM

## 2021-02-11 DIAGNOSIS — F411 Generalized anxiety disorder: Secondary | ICD-10-CM

## 2021-02-11 NOTE — Progress Notes (Signed)
Counselor/Therapist Progress Note  [name], MRN: [id number],    Date: 02/11/2021  Time Spent: 45 minutes    Treatment Type: Psychotherapy  Reported Symptoms: anxiety, anxious thoughts  Mental Status Exam:  Appearance:   Fairly Groomed     Behavior:  Appropriate and Sharing  Motor:  Normal  Speech/Language:   Clear and Coherent and Normal Rate  Affect:  Appropriate, Congruent and Full Range  Mood:  anxious  Thought process:  flight of ideas  Thought content:    WNL  Sensory/Perceptual disturbances:    WNL  Orientation:  oriented to person, place, time/date and situation  Attention:  Fair  Concentration:  Fair  Memory:  WNL  Fund of knowledge:   Good  Insight:    Good  Judgment:   Good  Impulse Control:  Good   Risk Assessment: Danger to Self:  No Self-injurious Behavior: No Danger to Others: No Duty to Warn:no Physical Aggression / Violence:No  Access to Firearms a concern: No  Gang Involvement:No   Subjective: Patient was engaged and cooperative throughout the session using time effectively to discuss thoughts and feelings. Patient voices continued motivation for treatment and understanding of mood and anxiety issues. Patient may benefit from future treatment because she remains motivated to decrease symptoms and reports benefit from regular sessions and medication management. Patient voices difficulties with access to psychiatry due to being self pay.   Interventions: Cognitive Behavioral Therapy Established psychological safety. Checked in with patient regarding her week. Engaged patient in processing current psychosocial stressors. Discussed patient's anxiety and feelings of rage. Explored patient's perception of challenges, highlighting thoughts vs. emotions and discussing unhelpful automatic thoughts and core beliefs. Encouraged patient to notice her physical sensations and/or thoughts, take deep breaths and walk away, if needed and then reflect on  thoughts and core beliefs once in a calm space. Encouraged patient to see psychiatrist for possible med adjustment due to anxiety symptoms. Provided support through active listening, validation of feelings, and highlighted patient's strengths.   Diagnosis:   ICD-10-CM   1. Generalized anxiety disorder  F41.1   2. Bipolar 2 disorder (HCC)  F31.81    Plan: Patient's goal PJ:KDTO with anxiety and working through things.She reports benefit from having someone to listen to her.  Treatment Target: Understand the relationship between thoughts, emotions, and behaviors   Psychoeducation on CBT model   Teach the connection between thoughts, emotions, and behaviors  Treatment Target: Increase realistic balanced thinking   Explore patient's thoughts, beliefs, automatic thoughts, assumptions   Identify unhelpful thinking patterns   Process distress and allow for emotional release   Questioning and challenging thoughts  Cognitive reappraisal   Core beliefs  Treatment Target: Increase coping skills  Mindfulness practices   Deep breathing   Teach distress tolerance techniques -"what helps me"  STOP technique  Treatment Target: Increase knowledge of child development and positive parenting practices   Through use of Triple P, increase parenting confidence and build awareness of child development, and increase parenting skills  Provide psychoeducation on Triple P positive parenting   Explore current parenting strategies   Provide opportunity to explore interventions from Triple P that can support patient in effective parenting practices  Parenting coaching and modeling for developing positive relationship with patient, encouraging desirable behavior, developing clear boundaries and behavioral expectations, providing effective commands and managing misbehavior.   Teach behavior management techniques from Triple P to reduce negative behaviors, including consistency,  age-appropriate expectations, prompting positive behaviors, using praise and  rewards, using clear instructions, and natural consequences to help with listening.  Future Appointments  Date Time Provider Department Center  02/24/2021 10:20 AM Danelle Berry, PA-C [facility]  03/05/2021  5:00 PM Kathreen Cosier, LCSW [facility] None   Kathreen Cosier, [place]

## 2021-02-12 LAB — URINE CULTURE: Culture: <10000 — AB

## 2021-02-16 DIAGNOSIS — Z419 Encounter for procedure for purposes other than remedying health state, unspecified: Secondary | ICD-10-CM | POA: Diagnosis not present

## 2021-02-24 ENCOUNTER — Ambulatory Visit: Payer: Medicaid Other | Admitting: Family Medicine

## 2021-02-24 DIAGNOSIS — G43909 Migraine, unspecified, not intractable, without status migrainosus: Secondary | ICD-10-CM

## 2021-02-24 DIAGNOSIS — R030 Elevated blood-pressure reading, without diagnosis of hypertension: Secondary | ICD-10-CM

## 2021-02-24 DIAGNOSIS — Z8669 Personal history of other diseases of the nervous system and sense organs: Secondary | ICD-10-CM

## 2021-03-05 ENCOUNTER — Ambulatory Visit: Payer: Medicaid Other | Admitting: Licensed Clinical Social Worker

## 2021-03-07 DIAGNOSIS — M25871 Other specified joint disorders, right ankle and foot: Secondary | ICD-10-CM | POA: Diagnosis not present

## 2021-03-07 DIAGNOSIS — M25571 Pain in right ankle and joints of right foot: Secondary | ICD-10-CM | POA: Diagnosis not present

## 2021-03-07 DIAGNOSIS — M25671 Stiffness of right ankle, not elsewhere classified: Secondary | ICD-10-CM | POA: Diagnosis not present

## 2021-03-10 ENCOUNTER — Ambulatory Visit: Payer: Medicaid Other | Admitting: Licensed Clinical Social Worker

## 2021-03-10 ENCOUNTER — Ambulatory Visit: Payer: Medicaid Other

## 2021-03-10 DIAGNOSIS — M25571 Pain in right ankle and joints of right foot: Secondary | ICD-10-CM | POA: Diagnosis not present

## 2021-03-10 DIAGNOSIS — F3181 Bipolar II disorder: Secondary | ICD-10-CM

## 2021-03-10 DIAGNOSIS — M25671 Stiffness of right ankle, not elsewhere classified: Secondary | ICD-10-CM | POA: Diagnosis not present

## 2021-03-10 DIAGNOSIS — F411 Generalized anxiety disorder: Secondary | ICD-10-CM

## 2021-03-10 NOTE — Progress Notes (Signed)
Counselor/Therapist Progress Note  [name], MRN: [id number],    Date: 03/10/2021  Time Spent: 34 minutes   Treatment Type: Psychotherapy  Reported Symptoms: overall decrease in mood and anxiety symptoms  Mental Status Exam:  Appearance:   Casual     Behavior:  Appropriate and Sharing  Motor:  Normal  Speech/Language:   Normal Rate  Affect:  Appropriate and Congruent  Mood:  normal  Thought process:  normal  Thought content:    WNL  Sensory/Perceptual disturbances:    WNL  Orientation:  oriented to person, place, time/date, situation and day of week  Attention:  Good  Concentration:  Good  Memory:  WNL  Fund of knowledge:   Good  Insight:    Good  Judgment:   Good  Impulse Control:  Good   Risk Assessment: Danger to Self:  No Self-injurious Behavior: No Danger to Others: No Duty to Warn:no Physical Aggression / Violence:No  Access to Firearms a concern: No  Gang Involvement:No   Subjective: Patient was engaged and cooperative throughout the session using time effectively to discuss thoughts and feelings. Patient voices continued motivation for treatment and understanding of mood and anxiety issues and the importance of self-care. Patient is likely to benefit from future treatment because she remains motivated to decrease symptoms and improve functioning and reports benefit from the combination of talk therapy and taking Lamictal.    Interventions: Cognitive Behavioral Therapy Established psychological safety. Checked in with patient regarding current mood and psychosocial symptoms, mood improvement. Explored patient's experience with mood improvement related to increased productivity and living within her values. Taught patient about depression and the connection to engaging in value driven behaviors. Encouraged patient to continue engaging in daily routine. Provided support through active listening, validation of feelings, and highlighted patient's  strengths.   Diagnosis:   ICD-10-CM   1. Generalized anxiety disorder  F41.1   2. Bipolar 2 disorder (HCC)  F31.81    Plan: Patient's goal EL:FYBO with anxiety and working through things.She reports benefit from having someone to listen to her.  Treatment Target: Understand the relationship between thoughts, emotions, and behaviors   Psychoeducation on CBT model   Teach the connection between thoughts, emotions, and behaviors  Treatment Target: Increase realistic balanced thinking   Explore patient's thoughts, beliefs, automatic thoughts, assumptions   Identify unhelpful thinking patterns   Process distress and allow for emotional release   Questioning and challenging thoughts  Cognitive reappraisal   Core beliefs  Treatment Target: Increase coping skills  Mindfulness practices   Deep breathing   STOP technique Treatment Target: Reducing vulnerability to "emotional mind" - Values clarification   - Self-care - nutrition, sleep, exercise  - Increase living within values  - Teach distress tolerance techniques -"what helps me"  Treatment Target: Increase knowledge of child development and positive parenting practices   Through use of Triple P, increase parenting confidence and build awareness of child development, and increase parenting skills  Provide psychoeducation on Triple P positive parenting   Explore current parenting strategies   Provide opportunity to explore interventions from Triple P that can support patient in effective parenting practices  Parenting coaching for developing positive relationship with patient, encouraging desirable behavior, developing clear boundaries and behavioral expectations, providing effective commands and managing misbehavior.   Teach behavior management techniques from Triple P to reduce negative behaviors, including consistency, age-appropriate expectations, prompting positive behaviors, using praise and rewards, using  clear instructions, and natural consequences to help with listening.  Future Appointments  Date Time Provider Department Center  03/11/2021  9:00 AM Gabriel Cirri, NP [facility]  03/18/2021  2:00 PM Kathreen Cosier, LCSW [facility] None  03/19/2021  2:00 PM Sherren Kerns, PT [facility] None  03/24/2021  2:00 PM Sherren Kerns, PT [facility] None  03/31/2021  2:00 PM Sherren Kerns, PT [facility] None  04/07/2021  2:00 PM Sherren Kerns, PT [facility] None  04/14/2021  2:00 PM Sherren Kerns, PT [facility] None  04/28/2021  2:00 PM Sherren Kerns, PT [facility] None  05/05/2021  2:00 PM Sherren Kerns, PT [facility] None  05/12/2021  2:00 PM Sherren Kerns, PT [facility] None    Kathreen Cosier, LCSW

## 2021-03-11 ENCOUNTER — Encounter: Payer: Self-pay | Admitting: Unknown Physician Specialty

## 2021-03-11 ENCOUNTER — Other Ambulatory Visit: Payer: Self-pay

## 2021-03-11 ENCOUNTER — Ambulatory Visit: Payer: Medicaid Other | Admitting: Unknown Physician Specialty

## 2021-03-11 DIAGNOSIS — J4521 Mild intermittent asthma with (acute) exacerbation: Secondary | ICD-10-CM

## 2021-03-11 DIAGNOSIS — Z8669 Personal history of other diseases of the nervous system and sense organs: Secondary | ICD-10-CM | POA: Diagnosis not present

## 2021-03-11 DIAGNOSIS — J4540 Moderate persistent asthma, uncomplicated: Secondary | ICD-10-CM | POA: Diagnosis not present

## 2021-03-11 DIAGNOSIS — M791 Myalgia, unspecified site: Secondary | ICD-10-CM | POA: Diagnosis not present

## 2021-03-11 MED ORDER — BUDESONIDE-FORMOTEROL FUMARATE 80-4.5 MCG/ACT IN AERO: 2.0000 | INHALATION_SPRAY | Freq: Two times a day (BID) | RESPIRATORY_TRACT | 3 refills | Status: DC

## 2021-03-11 NOTE — Assessment & Plan Note (Signed)
This is stable on Imitrex.  DC Inderal

## 2021-03-11 NOTE — Assessment & Plan Note (Signed)
Increase Symbicort to twice a day
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
## 2021-03-11 NOTE — Assessment & Plan Note (Signed)
New diagnosis.  Will check labs including rheumatological.  Will not move forward with medications at this time.

## 2021-03-12 DIAGNOSIS — Z3009 Encounter for other general counseling and advice on contraception: Secondary | ICD-10-CM | POA: Diagnosis not present

## 2021-03-12 LAB — COMPREHENSIVE METABOLIC PANEL
AG Ratio: 1.4 (calc) (ref 1.0–2.5)
ALT: 16 U/L (ref 6–29)
AST: 12 U/L (ref 10–30)
Albumin: 4.1 g/dL (ref 3.6–5.1)
Alkaline phosphatase (APISO): 77 U/L (ref 31–125)
BUN/Creatinine Ratio: 16 (calc) (ref 6–22)
BUN: 8 mg/dL (ref 7–25)
CO2: 27 mmol/L (ref 20–32)
Calcium: 9.5 mg/dL (ref 8.6–10.2)
Chloride: 101 mmol/L (ref 98–110)
Creat: 0.49 mg/dL — ABNORMAL LOW (ref 0.50–1.10)
Globulin: 3 g/dL (calc) (ref 1.9–3.7)
Glucose, Bld: 87 mg/dL (ref 65–99)
Potassium: 4.4 mmol/L (ref 3.5–5.3)
Sodium: 136 mmol/L (ref 135–146)
Total Bilirubin: 0.4 mg/dL (ref 0.2–1.2)
Total Protein: 7.1 g/dL (ref 6.1–8.1)

## 2021-03-12 LAB — CBC WITH DIFFERENTIAL/PLATELET
Absolute Monocytes: 322 cells/uL (ref 200–950)
Basophils Absolute: 7 cells/uL (ref 0–200)
Basophils Relative: 0.1 %
Eosinophils Absolute: 112 cells/uL (ref 15–500)
Eosinophils Relative: 1.6 %
HCT: 41.1 % (ref 35.0–45.0)
Hemoglobin: 13.5 g/dL (ref 11.7–15.5)
Lymphs Abs: 2590 cells/uL (ref 850–3900)
MCH: 27.5 pg (ref 27.0–33.0)
MCHC: 32.8 g/dL (ref 32.0–36.0)
MCV: 83.7 fL (ref 80.0–100.0)
MPV: 9.7 fL (ref 7.5–12.5)
Monocytes Relative: 4.6 %
Neutro Abs: 3969 cells/uL (ref 1500–7800)
Neutrophils Relative %: 56.7 %
Platelets: 358 10*3/uL (ref 140–400)
RBC: 4.91 10*6/uL (ref 3.80–5.10)
RDW: 13.6 % (ref 11.0–15.0)
Total Lymphocyte: 37 %
WBC: 7 10*3/uL (ref 3.8–10.8)

## 2021-03-12 LAB — B. BURGDORFI ANTIBODIES: B burgdorferi Ab IgG+IgM: <0.9 index

## 2021-03-12 LAB — SEDIMENTATION RATE: Sed Rate: 38 mm/h — ABNORMAL HIGH (ref 0–20)

## 2021-03-12 LAB — ANA: Anti Nuclear Antibody (ANA): NEGATIVE

## 2021-03-12 LAB — VITAMIN B12: Vitamin B-12: 376 pg/mL (ref 200–1100)

## 2021-03-18 ENCOUNTER — Ambulatory Visit: Payer: Medicaid Other | Admitting: Licensed Clinical Social Worker

## 2021-03-18 DIAGNOSIS — F3181 Bipolar II disorder: Secondary | ICD-10-CM

## 2021-03-18 DIAGNOSIS — F411 Generalized anxiety disorder: Secondary | ICD-10-CM
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
## 2021-03-19 ENCOUNTER — Ambulatory Visit: Payer: Medicaid Other | Attending: Obstetrics and Gynecology

## 2021-03-19 ENCOUNTER — Other Ambulatory Visit: Payer: Self-pay

## 2021-03-19 DIAGNOSIS — R278 Other lack of coordination: Secondary | ICD-10-CM

## 2021-03-19 DIAGNOSIS — M533 Sacrococcygeal disorders, not elsewhere classified: Secondary | ICD-10-CM | POA: Diagnosis not present

## 2021-03-19 DIAGNOSIS — N9419 Other specified dyspareunia: Secondary | ICD-10-CM | POA: Insufficient documentation

## 2021-03-19 DIAGNOSIS — N393 Stress incontinence (female) (male): Secondary | ICD-10-CM | POA: Insufficient documentation

## 2021-03-19 DIAGNOSIS — Z419 Encounter for procedure for purposes other than remedying health state, unspecified: Secondary | ICD-10-CM | POA: Diagnosis not present

## 2021-03-19 DIAGNOSIS — M25571 Pain in right ankle and joints of right foot: Secondary | ICD-10-CM | POA: Diagnosis not present

## 2021-03-19 DIAGNOSIS — M25671 Stiffness of right ankle, not elsewhere classified: Secondary | ICD-10-CM | POA: Diagnosis not present

## 2021-03-19 NOTE — Therapy (Signed)
[facility] MAIN [facility] SERVICES [address] [place], [place], [address] Phone: [phone]   Fax:  [phone]  Physical Therapy Evaluation  Patient Details  Name: Janet Rich MRN: [id number] Date of Birth: [birth year] Referring Provider (PT): Heloise Ochoa, CNM   Encounter Date: 03/19/2021   PT End of Session - 03/19/21 1517    Visit Number 1    Number of Visits 10   have to ask for 3 treatments first 2/2 Medicaid policy   Authorization Type [employer] (requires auth for visits)    PT Start Time 1352    PT Stop Time 1507    PT Time Calculation (min) 75 min    Activity Tolerance Patient tolerated treatment well;No increased pain    Behavior During Therapy WFL for tasks assessed/performed           Past Medical History:  Diagnosis Date  . Anemia    with periods  . Ankle fracture    right  . Anxiety   . Asthma   . Back pain   . Depression    Bipolar   . Endometriosis   . GERD (gastroesophageal reflux disease)   . Headache   . Ovarian cyst   . PCOS (polycystic ovarian syndrome)   . Pneumonia    age 25  . PONV (postoperative nausea and vomiting)    nausea after wisdom teeth extraction  . Vaginal Pap smear, abnormal    bx ok    Past Surgical History:  Procedure Laterality Date  . FRACTURE SURGERY     ankle surgery with impant   . HYSTEROSCOPY WITH D & C N/A 09/22/2019   Procedure: DILATATION AND CURETTAGE /HYSTEROSCOPY;  Surgeon: Ward, Elenora Fender, MD;  Location: [facility] ORS;  Service: Gynecology;  Laterality: N/A;  . ROBOTIC ASSISTED DIAGNOSTIC LAPAROSCOPY N/A 02/18/2017   Procedure: ROBOTIC ASSISTED DIAGNOSTIC LAPAROSCOPY,EXCISION AND ABLATION OF ENDOMETRIOSIS,LYSIS OF ADHESIONS;  Surgeon: Olivia Mackie, MD;  Location: [facility];  Service: Gynecology;  Laterality: N/A;  . VAGINAL DELIVERY N/A 08/14/2020   Procedure: VAGINAL DELIVERY;  Surgeon: Feliberto Gottron Ihor Austin, MD;  Location: [facility] OR[facility];  Service: Obstetrics;   Laterality: N/A;  . WISDOM TOOTH EXTRACTION  2013    There were no vitals filed for this visit.    Subjective Assessment - 03/19/21 1403    Subjective Pt stated she is currently receiving OP PT for ankle but wants to continue with pelvic health PT. Pt stated incontinence and pain began after first pregnancy, when she had a R ovarian cyst and she sneezed. Urination: She states things got worse after pregnancy with twins. Pt reported SUI with coughing, sneezing and "sitting wrong" (pt demonstrated this with one leg under buttocks). Pt also reports leaking urine when bladder is really full. Pt states she goes though one pant change a day 2/2 SUI. Pt urinates approx. every two hours. Pt urinates 0-1 times at night and stops drinking fluids before bed. Pt mostly drinks water, some apple juice, nutritional shakes. Bowel issues: pt is diagnosed with IBS but denied leakage. Pt has a bowel movement 1-2x/day. Pt states her bowel movements consistency changes all the time but not typically watery. Sexual function: pt reports pain with intercourse during and after and has incr. incontinence. Pt also reported pain with speculum during OBGYN. Core: Pt has B hip pain (in groin area) and back pain which began during a cheerleading accident (she caught somone in the air).    Pertinent History PCOS, endometriosis, Bipolar disorder,  hx of preeclampsia (twin pregnancy), IBS, asthma, R trigeminal neuralgia (still occuring), HTN, vulvar atrophy, anxiety, hx of scoliosis (age [age]-did not use brace but has gone to chiropractor.    How long can you sit comfortably? approx. 30 minutes    How long can you stand comfortably? approx. 10-15 minutes    How long can you walk comfortably? Pt has pain when she first walks for 10-15 minutes after prolonged sitting but then can amb. 3-4 hours    Patient Stated Goals "I'd like for sex to not hurt, for the speculum not to hurt, and to not leak all the time."    Currently in Pain?  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           Pelvic Floor Physical Therapy Evaluation and Assessment  Screenings: Red Flags:  Have you had any night sweats? no Unexplained weight loss? no Saddle anesthesia? no Unexplained changes in bowel or bladder changes? no  SUBJECTIVE  Patient reports:  See above  Precautions:  n/a   Recent Procedures/Tests/Findings:  Pt did have R ankle surgery over a year ago 2/2 falling off scooter and to replace tendon.    OBJECTIVE  Posture/Observations:  Sitting: Pt with tendency to sit with L LE under buttocks. Standing: Pt prefers to stand on LLE.   Palpation/Segmental Motion/Joint Play:  Special tests:     Range of Motion/Flexibilty:  Spine: Pt reported L LBP pain with pain referred into buttocks with lumbar ext. Pt reported concordant LBP with L sidebending and L groin pain with R sidebending. R rotation incr. L LBP and L groin pain. L rotation no pain. ROM WFL in all directions. Incr. Lx spine lordosis. Hips:    Strength/MMT:  LE MMT  LE MMT Left Right  Hip flex:  (L2) /5 /5  Hip ext: /5 /5  Hip abd: 3/5 3/5  Hip add: 3/5 3+/5  Hip IR /5 /5  Hip ER /5 /5     Abdominal:  Palpation: tenderness and incr. Tightness noted at L diaphragm inf. To ribs. Diastasis: None noted.   Pelvic Floor External Exam: Introitus Appears:  Skin integrity:  Palpation: Cough: Prolapse visible?: Scar mobility: Through clothing: Ischial  tuberosities: Tenderness reported during L IT palpation and incr. Tension noted during L hip ER in sidelying. Palpation for pelvic floor contraction:  Pt noted to experience bulge vs. pelvic floor contraction during external palpation of pelvic floor contraction.  Coccyx: tenderness along spine of coccyx   Gait Analysis:will formally assess next session.    INTERVENTIONS THIS SESSION: HEP:  Access Code: [email or web address] URL: [email or web address] Date: 03/19/2021 Prepared by: Zerita Boers  Exercises Sidelying Thoracic Rotation with Open Book - 2 x daily - 7 x weekly - 1 sets - 10 reps - 2 sec. hold Child's Pose  Stretch - 1-2 x daily - 7 x weekly -  sets - 3 reps All performed with cues and demo for proper technique.               Objective measurements completed on examination: See above findings.               PT Education - 03/19/21 1516    Education Details PT educated pt on PT POC, duration, and exam findings. PT provided pt with HEP: child's pose and open book. PT educated pt on proper posture and equal weight bearing in standing.    Person(s) Educated Patient    Methods Explanation;Demonstration;Tactile cues;Verbal cues;Handout    Comprehension Returned demonstration;Verbalized understanding            PT Short Term Goals - 03/19/21 1527      PT SHORT TERM GOAL #1   Title Pt will be IND in all HEP in order to improve pain, flexibility and pain. TARGET DATE FOR ALL STGS: 3 TREATMENTS    Baseline No HEP    Status New      PT SHORT TERM GOAL #2   Title Pt will verbalize proper lifting and posture techniques in order to reduce pain and care for children.    Baseline Pt unable to verbalize proper lifting or posture techniques.    Status New      PT SHORT TERM GOAL #3   Title Have pt take FOTO survery and write goals as indicated.    Baseline Not assessed    Status New             PT Long Term Goals - 03/19/21 1529      PT LONG  TERM GOAL #1   Title Pt will be IND in progressed HEP in order to improve strength, pain, continence and flexibility. TARGET DATE FOR ALL LTGS: AFTER 9 TREATMENTS    Baseline No HEP.    Status New      PT LONG TERM GOAL #2   Title Pt will report she is able to stand for 30 minutes without incr. in back/hip pain in order to cook at home.    Baseline 10-15 minutes prior to onset of pain    Status New      PT LONG TERM GOAL #3   Title Pt will change pants </=1 time per week 2/2 stress urinary incontinence.    Baseline 7 times a week    Status New      PT LONG TERM GOAL #4   Title Pt will report no pain during intercourse or speculum insertion during OBGYN exam.    Baseline 7/10 pain during/after intercourse and speculum insertion    Status New                  Plan - 03/19/21 1519    Clinical Impression Statement Pt is a pleasant 24y/o female [facility] to [facility] for pelvic pain and incontinence. Pt's PMH is significant for the following: PCOS, endometriosis, Bipolar disorder, hx of preeclampsia (twin pregnancy), IBS, asthma, R trigeminal neuralgia (still occuring), HTN, vulvar atrophy, anxiety, hx of scoliosis (age [age]-did not use brace but has gone to chiropractor. The following impairments were noted upon exam: decr. strength, pain, postural dysfunction, decr. coordination impaired flexibility and impaired balance during SLS activities. Pt would benefit from skilled PT to improve pain, continence, strength, posture, and flexibility in order to improve QOL and safety during ADLs and iADLs. Pt reported during session  she is receiving OPPT for R ankle issues and is only going 2/2 Medicaid requiring ankle PT prior to imaging or surgery. Pt seeking PT for pelvic pain and incontinence during this time, as this is very limiting for ADLs and IADLs and QOL.    Personal Factors and Comorbidities Comorbidity 3+;Time since onset of injury/illness/exacerbation;Past/Current Experience;Fitness     Comorbidities PCOS, endometriosis, Bipolar disorder, hx of preeclampsia (twin pregnancy), IBS, asthma, R trigeminal neuralgia (still occuring), HTN, vulvar atrophy, anxiety, hx of scoliosis (age [age]-did not use brace but has gone to chiropractor.    Examination-Activity Limitations Locomotion Level;Sit;Sleep;Stairs;Stand;Transfers;Continence;Lift;Caring for Others;Carry    Examination-Participation Restrictions Cleaning;Interpersonal Relationship;Laundry;Community Activity    Stability/Clinical Decision Making Evolving/Moderate complexity    Clinical Decision Making Moderate    Rehab Potential Good    PT Frequency 1x / week    PT Duration --   10 weeks   PT Treatment/Interventions ADLs/Self Care Home Management;Biofeedback;Neuromuscular re-education;Balance training;Therapeutic exercise;Therapeutic activities;Functional mobility training;Stair training;Gait training;Patient/family education;Manual techniques;Joint Manipulations;Spinal Manipulations;Dry needling    PT Next Visit Plan Gait assessment, review HEP and progress as tolerated. Trigger point release of psoas, assess spine mobility in prone and treat as indicated. Provide handout on proper lifting and posture techniques.    PT Home Exercise Plan Medbridge: [email or web address]    Consulted and Agree with Plan of Care Patient           Patient will benefit from skilled therapeutic intervention in order to improve the following deficits and impairments:  Abnormal gait,Decreased coordination,Decreased activity tolerance,Decreased balance,Decreased mobility,Decreased strength,Postural dysfunction,Impaired flexibility,Improper body mechanics,Pain  Visit Diagnosis: Stress incontinence (female) (female) - Plan: PT plan of care cert/re-cert  Other lack of coordination - Plan: PT plan of care cert/re-cert  Dyspareunia due to medical condition in female - Plan: PT plan of care cert/re-cert  Sacrococcygeal disorders, not elsewhere classified - Plan: PT  plan of care cert/re-cert     Problem List Patient Active Problem List   Diagnosis Date Noted  . Myalgia 03/11/2021  . Elevated BP without diagnosis of hypertension 01/08/2021  . Dichorionic diamniotic twin pregnancy in third trimester 08/14/2020  . Preeclampsia 08/14/2020  . Twin pregnancy delivered vaginally 08/14/2020  . Preterm labor 06/17/2020  . Right trigeminal neuralgia 11/08/2019  . Endometriosis 03/27/2019  . PCOS (polycystic ovarian syndrome) 03/27/2019  . Moderate persistent asthma 03/07/2019  . Post-nasal drainage 03/07/2019  . Irritable bowel syndrome with diarrhea 03/07/2019  . Gastroesophageal reflux disease without esophagitis 03/07/2019  . Class 2 obesity due to excess calories without serious comorbidity with body mass index (BMI) of 38.0 to 38.9 in adult 09/02/2018  . Bipolar 2 disorder (HCC) 08/12/2018  . History of low birth weight 03/11/2018  . Vulvar atrophy 03/11/2018  . History of migraine headaches 05/01/2015  . Anxiety and depression 05/01/2015    Janet Rich L 03/19/2021, 3:37 PM  [place] [facility] MAIN [facility] SERVICES [address] [place], [place], [address] Phone: [phone]   Fax:  [phone]  Name: Athea Haley MRN: [id number] Date of Birth: 06-Feb-1996  Zerita Boers, PT,DPT 03/19/21 3:39 PM Phone: [phone] Fax: [phone]

## 2021-03-24 ENCOUNTER — Ambulatory Visit: Payer: Medicaid Other

## 2021-03-24 ENCOUNTER — Other Ambulatory Visit: Payer: Self-pay

## 2021-03-24 DIAGNOSIS — N9419 Other specified dyspareunia: Secondary | ICD-10-CM

## 2021-03-24 DIAGNOSIS — N393 Stress incontinence (female) (male): Secondary | ICD-10-CM

## 2021-03-24 DIAGNOSIS — R278 Other lack of coordination: Secondary | ICD-10-CM | POA: Diagnosis not present

## 2021-03-24 DIAGNOSIS — M533 Sacrococcygeal disorders, not elsewhere classified: Secondary | ICD-10-CM | POA: Diagnosis not present

## 2021-03-24 NOTE — Therapy (Signed)
[facility] [address] [place], [place], [address] Phone: [phone]   Fax:  [phone]  Physical Therapy Treatment  Patient Details  Name: Janet Rich MRN: [id number] Date of Birth: [birth year] Referring Provider (PT): Heloise Ochoaebecca McVey, CNM   Encounter Date: 03/24/2021   PT End of Session - 03/24/21 1413    Visit Number 2    Number of Visits 10    Date for PT Re-Evaluation 06/06/21    Authorization Type [employer]    Authorization Time Period 03/24/21-06/06/21    Authorization - Visit Number 2    Authorization - Number of Visits 10    PT Start Time 1407    PT Stop Time 1500    PT Time Calculation (min) 53 min    Activity Tolerance Patient tolerated treatment well;No increased pain    Behavior During Therapy WFL for tasks assessed/performed           Past Medical History:  Diagnosis Date  . Anemia    with periods  . Ankle fracture    right  . Anxiety   . Asthma   . Back pain   . Depression    Bipolar   . Endometriosis   . GERD (gastroesophageal reflux disease)   . Headache   . Ovarian cyst   . PCOS (polycystic ovarian syndrome)   . Pneumonia    age 89  . PONV (postoperative nausea and vomiting)    nausea after wisdom teeth extraction  . Vaginal Pap smear, abnormal    bx ok    Past Surgical History:  Procedure Laterality Date  . FRACTURE SURGERY     ankle surgery with impant   . HYSTEROSCOPY WITH D & C N/A 09/22/2019   Procedure: DILATATION AND CURETTAGE /HYSTEROSCOPY;  Surgeon: Ward, Elenora Fenderhelsea C, MD;  Location: [facility];  Service: Gynecology;  Laterality: N/A;  . ROBOTIC ASSISTED DIAGNOSTIC LAPAROSCOPY N/A 02/18/2017   Procedure: ROBOTIC ASSISTED DIAGNOSTIC LAPAROSCOPY,EXCISION AND ABLATION OF ENDOMETRIOSIS,LYSIS OF ADHESIONS;  Surgeon: Olivia Mackieichard Taavon, MD;  Location: [facility];  Service: Gynecology;  Laterality: N/A;  . VAGINAL DELIVERY N/A 08/14/2020   Procedure: VAGINAL DELIVERY;  Surgeon:  Feliberto GottronSchermerhorn, Ihor Austinhomas J, MD;  Location: [facility];  Service: Obstetrics;  Laterality: N/A;  . WISDOM TOOTH EXTRACTION  2013    There were no vitals filed for this visit.   Subjective Assessment - 03/24/21 1409    Subjective Pt denied any changes since last visit. Pt reported she's been performing HEP most of the time. Pt reports ankle PT is going ok, she's states ankle is a little better    Pertinent History PCOS, endometriosis, Bipolar disorder, hx of preeclampsia (twin pregnancy), IBS, asthma, R trigeminal neuralgia (still occuring), HTN, vulvar atrophy, anxiety, hx of scoliosis (age [age]-did not use brace but has gone to chiropractor.    Patient Stated Goals "I'd like for sex to not hurt, for the speculum not to hurt, and to not leak all the time."    Currently in Pain? Yes    Pain Score 4     Pain Location Hip    Pain Orientation Left;Right   mostly on L side   Pain Descriptors / Indicators Aching    Pain Type Chronic pain    Pain Onset More than a month ago    Pain Frequency Intermittent    Aggravating Factors  unsure    Pain Relieving Factors lying down  [facility] PT Assessment - 03/24/21 1414      Palpation   Palpation comment Pt reported TTP over R: QL and LD and hip rotators.                         [facility] Adult PT Treatment/Exercise - 03/24/21 1414      Ambulation/Gait   Ambulation/Gait Yes    Ambulation/Gait Assistance 7: Independent    Ambulation Distance (Feet) 200 Feet    Assistive device None    Gait Pattern Step-through pattern   R hike seems to be elevated during gait, R IR of foot   Ambulation Surface Level;Indoor    Gait Comments Pt reports B heel wears out more on the medial side of shoe.      Manual Therapy   Manual Therapy Soft tissue mobilization    Manual therapy comments PT performed trigger point release to R QL and LD and hip rotators, along with soft tissue massage. Pt's LLD was no longer present after manual therapy.     Soft tissue mobilization Pt reported 1/10 pain after manual therapy and 0/10 at end of session.            NMR: Access Code: [email or web address] URL: [email or web address] Date: 03/24/2021 Prepared by: Zerita Boers  Exercises Child's Pose with Sidebending - 1 x daily - 7 x weekly - 1 sets - 3 reps - 30-60 hold (to the L to lengthen R side) Sidelying Thoracic Rotation with Open Book - 1 x daily - 7 x weekly - 3 sets - 10 reps Latissimus Dorsi Stretch at Wall - 1 x daily - 7 x weekly - 1 sets - 3 reps - 30 hold (modified to perpendicular to wall with arm in front of pt on wall vs. Overhead 2/2 hx of R shoulder pain.) Supine Piriformis Stretch - 1 x daily - 7 x weekly - 1 sets - 3 reps - 30 hold  Pt performed previous and new HEP activities. PT provided cues and demo for proper technique.    PT Education - 03/24/21 1626    Education Details PT reviewed previous HEP and added to HEP. PT reiterated the importance of being mindful of posture throughout the day.    Person(s) Educated Patient    Methods Explanation;Demonstration;Verbal cues;Handout    Comprehension Returned demonstration;Verbalized understanding            PT Short Term Goals - 03/19/21 1527      PT SHORT TERM GOAL #1   Title Pt will be IND in all HEP in order to improve pain, flexibility and pain. TARGET DATE FOR ALL STGS: 3 TREATMENTS    Baseline No HEP    Status New      PT SHORT TERM GOAL #2   Title Pt will verbalize proper lifting and posture techniques in order to reduce pain and care for children.    Baseline Pt unable to verbalize proper lifting or posture techniques.    Status New      PT SHORT TERM GOAL #3   Title Have pt take FOTO survery and write goals as indicated.    Baseline Not assessed    Status New             PT Long Term Goals - 03/19/21 1529      PT LONG TERM GOAL #1   Title Pt will be IND in progressed HEP in order to improve strength, pain, continence and flexibility.  TARGET  DATE FOR ALL LTGS: AFTER 9 TREATMENTS    Baseline No HEP.    Status New      PT LONG TERM GOAL #2   Title Pt will report she is able to stand for 30 minutes without incr. in back/hip pain in order to cook at home.    Baseline 10-15 minutes prior to onset of pain    Status New      PT LONG TERM GOAL #3   Title Pt will change pants </=1 time per week 2/2 stress urinary incontinence.    Baseline 7 times a week    Status New      PT LONG TERM GOAL #4   Title Pt will report no pain during intercourse or speculum insertion during OBGYN exam.    Baseline 7/10 pain during/after intercourse and speculum insertion    Status New                 Plan - 03/24/21 1457    Clinical Impression Statement Pt demonstrated progress as she reported 0/10 pain at end of session. PT noted pt's R hip elevated during gait and assessed for LLD in supine and found pt's LLE was approx. 0.75" longer than RLE. However, LLD appears functional in nature vs. structural as pt's LEs were even at medial malleolus after manual therapy. Pt continues to experience pain and incontinence and therefore would continue to benefit from skilled PT to improve pain, continence, and postural dysfunction.    Comorbidities PCOS, endometriosis, Bipolar disorder, hx of preeclampsia (twin pregnancy), IBS, asthma, R trigeminal neuralgia (still occuring), HTN, vulvar atrophy, anxiety, hx of scoliosis (age [age]-did not use brace but has gone to chiropractor.    PT Frequency 1x / week    PT Duration Other (comment)   10 weeks   PT Treatment/Interventions ADLs/Self Care Home Management;Biofeedback;Neuromuscular re-education;Balance training;Therapeutic exercise;Therapeutic activities;Functional mobility training;Stair training;Gait training;Patient/family education;Manual techniques;Joint Manipulations;Spinal Manipulations;Dry needling    PT Next Visit Plan review HEP and progress as tolerated. Manual therapy of QL, LD, hip  musculature prn. Pelvic floor contraction technique. Trigger point release of psoas, assess mobility in prone and treat as indicated. Provide handout on proper lifting and posture techniques.    PT Home Exercise Plan Medbridge:[email or web address]           Patient will benefit from skilled therapeutic intervention in order to improve the following deficits and impairments:  Abnormal gait,Decreased coordination,Decreased activity tolerance,Decreased balance,Decreased mobility,Decreased strength,Postural dysfunction,Impaired flexibility,Improper body mechanics,Pain  Visit Diagnosis: Other lack of coordination  Stress incontinence (female) (female)  Dyspareunia due to medical condition in female     Problem List Patient Active Problem List   Diagnosis Date Noted  . Myalgia 03/11/2021  . Elevated BP without diagnosis of hypertension 01/08/2021  . Dichorionic diamniotic twin pregnancy in third trimester 08/14/2020  . Preeclampsia 08/14/2020  . Twin pregnancy delivered vaginally 08/14/2020  . Preterm labor 06/17/2020  . Right trigeminal neuralgia 11/08/2019  . Endometriosis 03/27/2019  . PCOS (polycystic ovarian syndrome) 03/27/2019  . Moderate persistent asthma 03/07/2019  . Post-nasal drainage 03/07/2019  . Irritable bowel syndrome with diarrhea 03/07/2019  . Gastroesophageal reflux disease without esophagitis 03/07/2019  . Class 2 obesity due to excess calories without serious comorbidity with body mass index (BMI) of 38.0 to 38.9 in adult 09/02/2018  . Bipolar 2 disorder (HCC) 08/12/2018  . History of low birth weight 03/11/2018  . Vulvar atrophy 03/11/2018  . History of migraine headaches 05/01/2015  . Anxiety and depression 05/01/2015  Marcile Fuquay L 03/24/2021, 4:42 PM  [place] [facility] MAIN [facility] SERVICES [address] [place], [place], [address] Phone: [phone]   Fax:  [phone]  Name: Janet Rich MRN: [id number] Date of Birth:  [birth year]  Zerita Boers, PT,DPT 03/24/21 4:42 PM Phone: [phone] Fax: [phone]

## 2021-03-25 ENCOUNTER — Ambulatory Visit: Payer: Medicaid Other | Admitting: Licensed Clinical Social Worker

## 2021-03-25 DIAGNOSIS — F3181 Bipolar II disorder: Secondary | ICD-10-CM

## 2021-03-25 DIAGNOSIS — F411 Generalized anxiety disorder: Secondary | ICD-10-CM

## 2021-03-25 NOTE — Progress Notes (Signed)
Counselor/Therapist Progress Note  Patient ID: Charlayne Vultaggio, MRN: [id number],    Date: 03/25/2021  Time Spent: 45 minutes    Treatment Type: Psychotherapy  Reported Symptoms: overall increased mood stability  Mental Status Exam:  Appearance:   Casual     Behavior:  Appropriate and Sharing  Motor:  Normal  Speech/Language:   Clear and Coherent and Normal Rate  Affect:  Appropriate and Congruent  Mood:  euthymic  Thought process:  goal directed  Thought content:    WNL  Sensory/Perceptual disturbances:    WNL  Orientation:  oriented to person, place, time/date, situation and day of week  Attention:  Good  Concentration:  Good  Memory:  WNL  Fund of knowledge:   Good  Insight:    Good  Judgment:   Good  Impulse Control:  Good   Risk Assessment: Danger to Self:  No Self-injurious Behavior: No Danger to Others: No Duty to Warn:no Physical Aggression / Violence:No  Access to Firearms a concern: No  Gang Involvement:No   Subjective: Patient was engaged and cooperative throughout the session using time effectively to discuss thoughts and feelings. Patient voices continued motivation for treatment and understanding of mood and anxiety issues. Patient is likely to benefit from future treatment because she remains motivated to decrease symptoms and improve functioning and reports benefit of regular sessions in addressing these symptoms.  Patient reports no meds for one month and plans to restart soon.   Interventions: Cognitive Behavioral Therapy Established psychological safety. Checked in with patient regarding her week. Discussed current psychosocial stressors. Reviewed grounding exercises to reduce distress related to previous trauma. Also, highlighted unhelpful self-talk and discussed positive balance self-talk. Provided support through active listening, validation of feelings, and highlighted patient's strengths.   Diagnosis:   ICD-10-CM   1. Generalized anxiety disorder   F41.1   2. Mild bipolar II disorder, most recent episode major depressive (HCC)  F31.81    Plan: Patient's goal OA:CZYS with anxiety and working through things.She reports benefit from having someone to listen to her.  Treatment Target: Understand the relationship between thoughts, emotions, and behaviors   Psychoeducation on CBT model   Teach the connection between thoughts, emotions, and behaviors  Treatment Target: Increase realistic balanced thinking   Explore patient's thoughts, beliefs, automatic thoughts, assumptions   Identify unhelpful thinking patterns   Process distress and allow for emotional release   Questioning and challenging thoughts  Cognitive reappraisal  Core beliefs Treatment Target: Increase coping skills  Mindfulness practices   Deep breathing   STOP technique  Grounding exercises Treatment Target: Reducing vulnerability to "emotional mind"  Values clarification   Self-care -nutrition, sleep, exercise   Increase living within values  Teach distress tolerance techniques -"what helps me"  Treatment Target: Increase knowledge of child development and positive parenting practices   Through use of Triple P, increase parenting confidence and build awareness of child development, and increase parenting skills  Provide psychoeducation on Triple P positive parenting   Explore current parenting strategies   Provide opportunity to explore interventions from Triple P that can support patient in effective parenting practices  Parenting coaching for developing positive relationship with patient, encouraging desirable behavior, developing clear boundaries and behavioral expectations, providing effective commands and managing misbehavior.   Teach behavior management techniques from Triple P to reduce negative behaviors, including consistency, age-appropriate expectations, prompting positive behaviors, using praise and rewards, using clear  instructions, and natural consequences to help with listening.  Future Appointments  Date Time  Provider Department Center  03/31/2021  2:00 PM Zerita Boers L, PT [facility] None  03/31/2021  4:00 PM Kathreen Cosier, LCSW [facility] None  04/07/2021  2:00 PM Sherren Kerns, PT [facility] None  04/14/2021  2:00 PM Sherren Kerns, PT [facility] None  04/15/2021 10:20 AM Gabriel Cirri, NP [facility]  04/28/2021  2:00 PM Sherren Kerns, PT [facility] None  05/05/2021  2:00 PM Sherren Kerns, PT [facility] None  05/12/2021  2:00 PM Sherren Kerns, PT [facility] None   Kathreen Cosier, LCSW

## 2021-03-28 DIAGNOSIS — M25671 Stiffness of right ankle, not elsewhere classified: Secondary | ICD-10-CM | POA: Diagnosis not present

## 2021-03-28 DIAGNOSIS — M25571 Pain in right ankle and joints of right foot: Secondary | ICD-10-CM | POA: Diagnosis not present

## 2021-03-31 ENCOUNTER — Ambulatory Visit: Payer: Medicaid Other

## 2021-03-31 ENCOUNTER — Other Ambulatory Visit: Payer: Self-pay

## 2021-03-31 ENCOUNTER — Ambulatory Visit: Payer: Medicaid Other | Admitting: Licensed Clinical Social Worker

## 2021-03-31 DIAGNOSIS — M533 Sacrococcygeal disorders, not elsewhere classified: Secondary | ICD-10-CM

## 2021-03-31 DIAGNOSIS — F3181 Bipolar II disorder: Secondary | ICD-10-CM

## 2021-03-31 DIAGNOSIS — R278 Other lack of coordination: Secondary | ICD-10-CM | POA: Diagnosis not present

## 2021-03-31 DIAGNOSIS — N393 Stress incontinence (female) (male): Secondary | ICD-10-CM

## 2021-03-31 DIAGNOSIS — F411 Generalized anxiety disorder: Secondary | ICD-10-CM

## 2021-03-31 DIAGNOSIS — N9419 Other specified dyspareunia: Secondary | ICD-10-CM | POA: Diagnosis not present

## 2021-03-31 NOTE — Progress Notes (Signed)
Counselor/Therapist Progress Note  [name], MRN: [id number],    Date: 03/31/2021  Time Spent: 45 minutes    Treatment Type: Psychotherapy  Reported Symptoms: Obsessive thinking, Fatigue, Physical aches and pain, and anxiety, anxious thoughts   Mental Status Exam:  Appearance:   Casual     Behavior:  Appropriate and Sharing  Motor:  Normal  Speech/Language:   Clear and Coherent  Affect:  Appropriate, Congruent, Full Range, and Tearful  Mood:  anxious  Thought process:  goal directed  Thought content:    WNL  Sensory/Perceptual disturbances:    WNL  Orientation:  oriented to person, place, time/date, situation, and day of week  Attention:  Good  Concentration:  Good  Memory:  WNL  Fund of knowledge:   Good  Insight:    Good  Judgment:   Good  Impulse Control:  Good   Risk Assessment: Danger to Self:  No Self-injurious Behavior: No Danger to Others: No Duty to Warn:no Physical Aggression / Violence:No  Access to Firearms a concern: No  Gang Involvement:No   Subjective: Patient was engaged and cooperative throughout the session using time effectively to discuss thoughts , feelings, and coping strategies.  Patient voices continued motivation for treatment and understanding of mood and anxiety issues. Patient is likely to benefit from future treatment because  remains motivated to decrease symptoms and reports benefit of regular sessions.   Interventions: Cognitive Behavioral Therapy Established psychological safety. Checked in with patient regarding her week. 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 Validated patient's feelings of overwhelm, identifying origins of these feelings and identifying points of intervention. Highlighted distorted thought pattern catastrophizing / magnification and discussed strategies to manage this  including noticing, observing the thought and accepting it as an anxious thought and also weighing the evidence.  Also, assisted patient in developing a starting point to get tasks completed. Provided support through active listening, validation of feelings, and highlighted patient's strengths.   Diagnosis:   ICD-10-CM   1. Generalized anxiety disorder  F41.1     2. Mild bipolar II disorder, most recent episode major depressive (HCC)  F31.81      Plan: Patient's goal is: Help with anxiety and working through things. She reports benefit from having someone to listen to her.   Treatment Target: Understand the relationship between thoughts, emotions, and behaviors Psychoeducation on CBT model   Teach the connection between thoughts, emotions, and behaviors Treatment Target: Increase realistic balanced thinking Explore patient's thoughts, beliefs, automatic thoughts, assumptions Identify unhelpful thinking patterns Process distress and allow for emotional release Questioning and challenging thoughts Cognitive reappraisal  Core beliefs   Treatment Target: Increase coping skills Mindfulness practices Deep breathing STOP technique Grounding exercises Treatment Target: Reducing vulnerability to "emotional mind" Values clarification   Self-care - nutrition, sleep, exercise Increase living within values  Teach distress tolerance techniques - "what helps me"    Treatment Target: Increase knowledge of child development and positive parenting practices   Through use of Triple P, increase parenting confidence and build awareness of child development, and increase parenting skills Provide psychoeducation on Triple P positive parenting Explore current parenting strategies Provide opportunity to explore interventions from Triple P that can support patient in effective parenting practices Parenting coaching for developing positive relationship with patient, encouraging desirable behavior,  developing clear boundaries and behavioral expectations, providing effective commands and managing misbehavior. Teach  behavior management techniques from Triple P to reduce negative behaviors, including consistency, age-appropriate expectations, prompting positive behaviors, using praise and rewards, using clear instructions, and natural consequences to help with listening.   Future Appointments  Date Time Provider Department Center  04/07/2021  2:00 PM Sherren Kerns, PT [facility] None  04/08/2021  2:00 PM Kathreen Cosier, LCSW [facility] None  04/14/2021  2:00 PM Sherren Kerns, PT [facility] None  04/15/2021 10:20 AM Gabriel Cirri, NP [facility]  04/28/2021  2:00 PM Sherren Kerns, PT [facility] None  05/05/2021  2:00 PM Sherren Kerns, PT [facility] None  05/12/2021  2:00 PM Sherren Kerns, PT [facility] None    Kathreen Cosier, LCSW

## 2021-03-31 NOTE — Therapy (Signed)
[facility] MAIN [facility] SERVICES [address] [place], [place], [address] Phone: [phone]   Fax:  [phone]  Physical Therapy Treatment  Patient Details  Name: Janet Rich MRN: [id number] Date of Birth: [birth year] Referring Provider (PT): Heloise Ochoa, CNM   Encounter Date: 03/31/2021   PT End of Session - 03/31/21 1633     Visit Number 3    Number of Visits 10    Date for PT Re-Evaluation 06/06/21    Authorization Type [facility] Medicaid    Authorization Time Period 03/24/21-06/06/21    Authorization - Visit Number 3    Authorization - Number of Visits 10    PT Start Time 1409    PT Stop Time 1503    PT Time Calculation (min) 54 min    Activity Tolerance Patient tolerated treatment well;No increased pain    Behavior During Therapy WFL for tasks assessed/performed             Past Medical History:  Diagnosis Date   Anemia    with periods   Ankle fracture    right   Anxiety    Asthma    Back pain    Depression    Bipolar    Endometriosis    GERD (gastroesophageal reflux disease)    Headache    Ovarian cyst    PCOS (polycystic ovarian syndrome)    Pneumonia    age 1   PONV (postoperative nausea and vomiting)    nausea after wisdom teeth extraction   Vaginal Pap smear, abnormal    bx ok    Past Surgical History:  Procedure Laterality Date   FRACTURE SURGERY     ankle surgery with impant    HYSTEROSCOPY WITH D & C N/A 09/22/2019   Procedure: DILATATION AND CURETTAGE /HYSTEROSCOPY;  Surgeon: Ward, Elenora Fender, MD;  Location: [facility] ORS;  Service: Gynecology;  Laterality: N/A;   ROBOTIC ASSISTED DIAGNOSTIC LAPAROSCOPY N/A 02/18/2017   Procedure: ROBOTIC ASSISTED DIAGNOSTIC LAPAROSCOPY,EXCISION AND ABLATION OF ENDOMETRIOSIS,LYSIS OF ADHESIONS;  Surgeon: Olivia Mackie, MD;  Location: [facility];  Service: Gynecology;  Laterality: N/A;   VAGINAL DELIVERY N/A 08/14/2020   Procedure: VAGINAL DELIVERY;  Surgeon: Feliberto Gottron  Ihor Austin, MD;  Location: [facility] ORS;  Service: Obstetrics;  Laterality: N/A;   WISDOM TOOTH EXTRACTION  2013    There were no vitals filed for this visit.   Subjective Assessment - 03/31/21 1414     Subjective Pt denied any changes since last visit. Pt reported she's having some anxiety 2/2 ankle PT is is an open gym and she is worried about COVID. Pt had a migraine last night and slept for 12 hours last night and feels better. Pt lost her HEP, performed them but unsure of reps.    Pertinent History PCOS, endometriosis, Bipolar disorder, hx of preeclampsia (twin pregnancy), IBS, asthma, R trigeminal neuralgia (still occuring), HTN, vulvar atrophy, anxiety, hx of scoliosis (age [age]-did not use brace but has gone to chiropractor.    Patient Stated Goals "I'd like for sex to not hurt, for the speculum not to hurt, and to not leak all the time."    Currently in Pain? Yes    Pain Score 6     Pain Location Other (Comment)   general diffuse body pain   Pain Orientation Other (Comment)   all over   Pain Descriptors / Indicators Tightness;Other (Comment)   joint pain and skin pain   Pain Type Chronic pain  Pain Onset More than a month ago    Pain Frequency Intermittent    Aggravating Factors  more movement    Pain Relieving Factors rest, lying down              NMR:  Access Code: [email or web address] URL: [email or web address] Date: 03/31/2021 Prepared by: Zerita BoersJennifer Rosalie Buenaventura  Exercises Child's Pose with Sidebending - 1 x daily - 7 x weekly - 1 sets - 3 reps - 30-60 hold Sidelying Thoracic Rotation with Open Book - 1 x daily - 7 x weekly - 3 sets - 10 reps Latissimus Dorsi Stretch at Wall - 1 x daily - 7 x weekly - 1 sets - 3 reps - 30 hold Supine Piriformis Stretch - 1 x daily - 7 x weekly - 1 sets - 3 reps - 30 hold   PT provided pt with demo and cues for proper technique, including modifications as indicated.  FOTO: PFDI pain: 71, urinary problems: 52, PFDI urinary:  75               [facility] Adult PT Treatment/Exercise - 03/31/21 1643       Manual Therapy   Manual Therapy Joint mobilization;Soft tissue mobilization    Manual therapy comments Pt reported no pain at end of session.    Joint Mobilization B sacral sustained PA hold 2x30 sec., sustained PA at coccyx with 5 pelvic floor contractions.    Soft tissue mobilization PT provided trigger point release to R adductors with contract/relax 10 sec. hold x3 with stretch. Massage to R QL.                    PT Education - 03/31/21 1632     Education Details PT reviewed HEP as pt lost prior paperwork. PT again reiterated the importance of being aware of posture and pt reported sitting on her feet less. PT also encouraged pt to walk and begin gentle yoga poses-PT will modify as indicated.    Person(s) Educated Patient    Methods Explanation;Demonstration;Verbal cues;Handout    Comprehension Verbalized understanding;Returned demonstration              PT Short Term Goals - 03/31/21 1641       PT SHORT TERM GOAL #1   Title Pt will be IND in all HEP in order to improve pain, flexibility and pain. TARGET DATE FOR ALL STGS: 3 TREATMENTS    Baseline No HEP    Status New      PT SHORT TERM GOAL #2   Title Pt will verbalize proper lifting and posture techniques in order to reduce pain and care for children.    Baseline Pt unable to verbalize proper lifting or posture techniques.    Status New      PT SHORT TERM GOAL #3   Title Have pt take FOTO survery and write goals as indicated.    Baseline 03/31/21: 71 PFDI pain, 52 urinary problem, 75 PFDI urinary    Status Achieved               PT Long Term Goals - 03/31/21 1642       PT LONG TERM GOAL #1   Title Pt will be IND in progressed HEP in order to improve strength, pain, continence and flexibility. TARGET DATE FOR ALL LTGS: AFTER 9 TREATMENTS    Baseline No HEP.    Status New      PT LONG TERM GOAL #2   Title Pt  will  report she is able to stand for 30 minutes without incr. in back/hip pain in order to cook at home.    Baseline 10-15 minutes prior to onset of pain    Status New      PT LONG TERM GOAL #3   Title Pt will change pants </=1 time per week 2/2 stress urinary incontinence.    Baseline 7 times a week    Status New      PT LONG TERM GOAL #4   Title Pt will report no pain during intercourse or speculum insertion during OBGYN exam.    Baseline 7/10 pain during/after intercourse and speculum insertion    Status New      PT LONG TERM GOAL #5   Title Pt will improve PFDI pain score to </=48 to improve pain and QOL.    Baseline 71    Status New                   Plan - 03/31/21 1635     Clinical Impression Statement Pt demonstrated progress as she reported 0/10 pain at end of session. Today's skilled session on reviewing HEP and reducing pain. Pt's R QL and adductors were TTP with decr. pain after manual therapy. Pt's coccyx noted to be stuck in flexion today, which improved with sustained PA from PT during five pelvic floor contractions. Pt's PFDI pain and urinary scores are high, indicating significant impact on QOL and function. Pt would continue to benefit from skilled PT to improve pain, leakage, and strength while caring for her family and to improve QOL.    Comorbidities PCOS, endometriosis, Bipolar disorder, hx of preeclampsia (twin pregnancy), IBS, asthma, R trigeminal neuralgia (still occuring), HTN, vulvar atrophy, anxiety, hx of scoliosis (age [age]-did not use brace but has gone to chiropractor.    PT Frequency 1x / week    PT Duration Other (comment)   10 weeks   PT Treatment/Interventions ADLs/Self Care Home Management;Biofeedback;Neuromuscular re-education;Balance training;Therapeutic exercise;Therapeutic activities;Functional mobility training;Stair training;Gait training;Patient/family education;Manual techniques;Joint Manipulations;Spinal Manipulations;Dry needling     PT Next Visit Plan review HEP and progress as tolerated. Manual therapy of QL, LD, hip musculature prn. Pelvic floor contraction technique. Trigger point release of psoas, assess mobility in prone and treat as indicated. Provide handout on proper lifting and posture techniques.    PT Home Exercise Plan Medbridge:[email or web address]             Patient will benefit from skilled therapeutic intervention in order to improve the following deficits and impairments:  Abnormal gait, Decreased coordination, Decreased activity tolerance, Decreased balance, Decreased mobility, Decreased strength, Postural dysfunction, Impaired flexibility, Improper body mechanics, Pain  Visit Diagnosis: Other lack of coordination  Sacrococcygeal disorders, not elsewhere classified  Dyspareunia due to medical condition in female  Stress incontinence (female) (female)     Problem List Patient Active Problem List   Diagnosis Date Noted   Myalgia 03/11/2021   Elevated BP without diagnosis of hypertension 01/08/2021   Dichorionic diamniotic twin pregnancy in third trimester 08/14/2020   Preeclampsia 08/14/2020   Twin pregnancy delivered vaginally 08/14/2020   Preterm labor 06/17/2020   Right trigeminal neuralgia 11/08/2019   Endometriosis 03/27/2019   PCOS (polycystic ovarian syndrome) 03/27/2019   Moderate persistent asthma 03/07/2019   Post-nasal drainage 03/07/2019   Irritable bowel syndrome with diarrhea 03/07/2019   Gastroesophageal reflux disease without esophagitis 03/07/2019   Class 2 obesity due to excess calories without serious comorbidity with body mass index (BMI) of 38.0 to  38.9 in adult 09/02/2018   Bipolar 2 disorder (HCC) 08/12/2018   History of low birth weight 03/11/2018   Vulvar atrophy 03/11/2018   History of migraine headaches 05/01/2015   Anxiety and depression 05/01/2015    Lysle Yero L 03/31/2021, 4:50 PM  [place] [facility] MAIN [facility] SERVICES [address] [place], [place], [address] Phone: [phone]   Fax:  [phone]  Name: Janet Rich MRN: [id number] Date of Birth: May 05, 1996  Zerita Boers, PT,DPT 03/31/21 4:50 PM Phone: [phone] Fax: [phone]

## 2021-04-02 DIAGNOSIS — Z20822 Contact with and (suspected) exposure to covid-19: Secondary | ICD-10-CM | POA: Diagnosis not present

## 2021-04-07 ENCOUNTER — Ambulatory Visit: Payer: Medicaid Other

## 2021-04-07 ENCOUNTER — Other Ambulatory Visit: Payer: Self-pay

## 2021-04-07 DIAGNOSIS — R278 Other lack of coordination: Secondary | ICD-10-CM | POA: Diagnosis not present

## 2021-04-07 DIAGNOSIS — N393 Stress incontinence (female) (male): Secondary | ICD-10-CM

## 2021-04-07 DIAGNOSIS — M533 Sacrococcygeal disorders, not elsewhere classified: Secondary | ICD-10-CM | POA: Diagnosis not present

## 2021-04-07 DIAGNOSIS — N9419 Other specified dyspareunia: Secondary | ICD-10-CM | POA: Diagnosis not present

## 2021-04-07 NOTE — Therapy (Signed)
[place] MAIN [facility] SERVICES [address] [place], [place], [address] Phone: [phone]   Fax:  [phone]  Physical Therapy Treatment  Patient Details  Name: Janet Rich MRN: [id number] Date of Birth: May 19, 1996 Referring Provider (PT): Hassan Buckler, CNM   Encounter Date: 04/07/2021   PT End of Session - 04/07/21 1620     Visit Number 4    Number of Visits 10    Date for PT Re-Evaluation 06/06/21    Authorization Type [facility] Medicaid    Authorization Time Period 03/24/21-06/06/21    Authorization - Visit Number 4    Authorization - Number of Visits 10    PT Start Time 5929    PT Stop Time 2446    PT Time Calculation (min) 53 min    Activity Tolerance Patient tolerated treatment well;No increased pain    Behavior During Therapy WFL for tasks assessed/performed             Past Medical History:  Diagnosis Date   Anemia    with periods   Ankle fracture    right   Anxiety    Asthma    Back pain    Depression    Bipolar    Endometriosis    GERD (gastroesophageal reflux disease)    Headache    Ovarian cyst    PCOS (polycystic ovarian syndrome)    Pneumonia    age 25   PONV (postoperative nausea and vomiting)    nausea after wisdom teeth extraction   Vaginal Pap smear, abnormal    bx ok    Past Surgical History:  Procedure Laterality Date   FRACTURE SURGERY     ankle surgery with impant    HYSTEROSCOPY WITH D & C N/A 09/22/2019   Procedure: DILATATION AND CURETTAGE /HYSTEROSCOPY;  Surgeon: Ward, Honor Loh, MD;  Location: [facility] ORS;  Service: Gynecology;  Laterality: N/A;   ROBOTIC ASSISTED DIAGNOSTIC LAPAROSCOPY N/A 02/18/2017   Procedure: ROBOTIC ASSISTED DIAGNOSTIC LAPAROSCOPY,EXCISION AND ABLATION OF ENDOMETRIOSIS,LYSIS OF ADHESIONS;  Surgeon: Brien Few, MD;  Location: [facility];  Service: Gynecology;  Laterality: N/A;   VAGINAL DELIVERY N/A 08/14/2020   Procedure: VAGINAL DELIVERY;  Surgeon: Ouida Sills  Gwen Her, MD;  Location: [facility] ORS;  Service: Obstetrics;  Laterality: N/A;   [place] EXTRACTION  2013    There were no vitals filed for this visit.   Subjective Assessment - 04/07/21 1411     Subjective Pt reported less leaking with coughing while she was sick, about 50% of the time. Pt is still going to ankle PT but missed last week 2/2 illness but negative for COVID. Pt reported she wasn't able to perform HEP except for stretches 2/2 being ill.    Pertinent History PCOS, endometriosis, Bipolar disorder, hx of preeclampsia (twin pregnancy), IBS, asthma, R trigeminal neuralgia (still occuring), HTN, vulvar atrophy, anxiety, hx of scoliosis (age [age]-did not use brace but has gone to chiropractor.    Patient Stated Goals "I'd like for sex to not hurt, for the speculum not to hurt, and to not leak all the time."    Currently in Pain? Yes    Pain Score 8     Pain Location Other (Comment)   mainly groin and then diffuse pain after not performing HEP.   Pain Orientation Other (Comment)   diffuse with R groin being more painful vs. L groin.   Pain Descriptors / Indicators Aching    Pain Type Chronic pain  Pain Radiating Towards RLE    Pain Onset More than a month ago    Pain Frequency Intermittent    Aggravating Factors  walking    Pain Relieving Factors sitting and bending forward               NMR:  Access Code: [email or web address] URL: [email or web address] Date: 03/31/2021 Prepared by: Geoffry Paradise   Exercises Sidelying Thoracic Rotation with Open Book - 1 x daily - 7 x weekly - 3 sets - 10 reps with improved technique noted. Pillow placed between knees. Supine angels with elbows flexed after mobs x 10 reps afterwards to maintain tx extension Rocking in quadruped to relax after manual therapy. Supine pelvic floor contraction x10 reps with breath. Pt performed one 4 sec. Hold contraction but PT discouraged pt from performing at home 2/2 incr. PF muscle  tension. Diaphragmatic breathing x 5 reps with hands on chest and stomach. Encouraged pt to practice breathing when stressed.    PT provided pt with demo and cues for proper technique.                [facility] Adult PT Treatment/Exercise - 04/07/21 1425       Manual Therapy   Manual Therapy Joint mobilization;Soft tissue mobilization    Manual therapy comments Pt reported pain decr. to 5/10 by end of session.    Joint Mobilization Tx spine T2-T6 Grade 3 mobs 2x30 sec. with pt performing supine angels after mobs x 10 reps afterwards to maintain tx extension. B sacral sustained PA hold 2x30 sec., sustained PA at coccyx with 5 pelvic floor contractions.    Soft tissue mobilization PTperformed massage to R QL, iliacus and paraspinals to reduce pain.                    PT Education - 04/07/21 1619     Education Details PT educated pt on proper lifting technique when doing household chores and carrying/lifting her children in order to reduce pain and to avoid injury. PT reviewed goal progress with pt and encouraged pt to continue HEP now that she is feeling better.    Person(s) Educated Patient    Methods Explanation;Demonstration;Verbal cues;Handout    Comprehension Returned demonstration;Verbalized understanding              PT Short Term Goals - 04/07/21 1623       PT SHORT TERM GOAL #1   Title Pt will be IND in all HEP in order to improve pain, flexibility and pain. TARGET DATE FOR ALL STGS: 3 TREATMENTS    Baseline No HEP    Status Partially Met      PT SHORT TERM GOAL #2   Title Pt will verbalize proper lifting and posture techniques in order to reduce pain and care for children.    Baseline Pt unable to verbalize proper lifting or posture techniques.    Status Partially Met      PT SHORT TERM GOAL #3   Title Have pt take FOTO survery and write goals as indicated.    Baseline 03/31/21: 71 PFDI pain, 52 urinary problem, 75 PFDI urinary    Status Achieved                PT Long Term Goals - 03/31/21 1642       PT LONG TERM GOAL #1   Title Pt will be IND in progressed HEP in order to improve strength, pain, continence and flexibility. TARGET DATE FOR ALL LTGS:  AFTER 9 TREATMENTS    Baseline No HEP.    Status New      PT LONG TERM GOAL #2   Title Pt will report she is able to stand for 30 minutes without incr. in back/hip pain in order to cook at home.    Baseline 10-15 minutes prior to onset of pain    Status New      PT LONG TERM GOAL #3   Title Pt will change pants </=1 time per week 2/2 stress urinary incontinence.    Baseline 7 times a week    Status New      PT LONG TERM GOAL #4   Title Pt will report no pain during intercourse or speculum insertion during OBGYN exam.    Baseline 7/10 pain during/after intercourse and speculum insertion    Status New      PT LONG TERM GOAL #5   Title Pt will improve PFDI pain score to </=48 to improve pain and QOL.    Baseline 71    Status New                   Plan - 04/07/21 1623     Clinical Impression Statement Pt demonstrating progress as she met STG 3 and partially met STGs 1 and 2, as pt was sick last week and unable to perform HEP 2/2 fatigue and illness. Pt demonstrated progress today as she reported decr. pain at end of session. Pt also able to coordinate pelvic floor contractions with breathing with less cues today. PT will likely perform internal pelvic floor muscle assessment next session to address any tension and formally perform MMT of pelvic floor musculature. Pt did not have LLD after manual therapy today. Pt would continue to benefit from skilled PT to improve pain, incontinence, and strength during ADLs and caring for children.    Comorbidities PCOS, endometriosis, Bipolar disorder, hx of preeclampsia (twin pregnancy), IBS, asthma, R trigeminal neuralgia (still occuring), HTN, vulvar atrophy, anxiety, hx of scoliosis (age [age]-did not use brace but has gone to  chiropractor.    PT Frequency 1x / week    PT Duration Other (comment)   10 weeks   PT Treatment/Interventions ADLs/Self Care Home Management;Biofeedback;Neuromuscular re-education;Balance training;Therapeutic exercise;Therapeutic activities;Functional mobility training;Stair training;Gait training;Patient/family education;Manual techniques;Joint Manipulations;Spinal Manipulations;Dry needling    PT Next Visit Plan review HEP and progress as tolerated. Manual therapy of QL, LD, hip musculature prn. Internal exam? Pelvic floor contraction technique. Trigger point release of psoas, assess mobility in prone and treat as indicated. Print handout on proper lifting and posture techniques.    PT Home Exercise Plan Medbridge:[email or web address]             Patient will benefit from skilled therapeutic intervention in order to improve the following deficits and impairments:  Abnormal gait, Decreased coordination, Decreased activity tolerance, Decreased balance, Decreased mobility, Decreased strength, Postural dysfunction, Impaired flexibility, Improper body mechanics, Pain  Visit Diagnosis: Sacrococcygeal disorders, not elsewhere classified  Stress incontinence (female) (female)  Other lack of coordination     Problem List Patient Active Problem List   Diagnosis Date Noted   Myalgia 03/11/2021   Elevated BP without diagnosis of hypertension 01/08/2021   Dichorionic diamniotic twin pregnancy in third trimester 08/14/2020   Preeclampsia 08/14/2020   Twin pregnancy delivered vaginally 08/14/2020   Preterm labor 06/17/2020   Right trigeminal neuralgia 11/08/2019   Endometriosis 03/27/2019   PCOS (polycystic ovarian syndrome) 03/27/2019   Moderate persistent asthma 03/07/2019  Post-nasal drainage 03/07/2019   Irritable bowel syndrome with diarrhea 03/07/2019   Gastroesophageal reflux disease without esophagitis 03/07/2019   Class 2 obesity due to excess calories without serious comorbidity with body  mass index (BMI) of 38.0 to 38.9 in adult 09/02/2018   Bipolar 2 disorder ([place]) 08/12/2018   History of low birth weight 03/11/2018   Vulvar atrophy 03/11/2018   History of migraine headaches 05/01/2015   Anxiety and depression 05/01/2015    Karmyn Lowman L 04/07/2021, 4:28 PM  [place] MAIN [facility] SERVICES [address] [place], [place], [address] Phone: [phone]   Fax:  [phone]  Name: Janet Rich MRN: [id number] Date of Birth: 1996-04-26  Geoffry Paradise, PT,DPT 04/07/21 4:32 PM Phone: [phone] Fax: [phone]

## 2021-04-08 ENCOUNTER — Ambulatory Visit: Payer: Medicaid Other | Admitting: Licensed Clinical Social Worker

## 2021-04-08 DIAGNOSIS — F411 Generalized anxiety disorder: Secondary | ICD-10-CM

## 2021-04-08 DIAGNOSIS — F3181 Bipolar II disorder: Secondary | ICD-10-CM

## 2021-04-08 NOTE — Progress Notes (Signed)
Counselor/Therapist Progress Note  [name], MRN: [id number],    Date: 04/08/2021  Time Spent: 40 minutes patient arrived late to Zoom.    Treatment Type: Psychotherapy  Reported Symptoms: Fatigue, Physical aches and pain, and euthymic   Mental Status Exam:  Appearance:   Casual     Behavior:  Appropriate and Sharing  Motor:  Normal  Speech/Language:   Clear and Coherent and Normal Rate  Affect:  Appropriate, Congruent, and Full Range  Mood:  euthymic  Thought process:  goal directed  Thought content:    WNL  Sensory/Perceptual disturbances:    WNL  Orientation:  oriented to person, place, time/date, and situation  Attention:  Good  Concentration:  Good  Memory:  WNL  Fund of knowledge:   Good  Insight:    Good  Judgment:   Good  Impulse Control:  Good   Risk Assessment: Danger to Self:  No Self-injurious Behavior: No Danger to Others: No Duty to Warn:no Physical Aggression / Violence:No  Access to Firearms a concern: No  Gang Involvement:No   Subjective: Patient was engaged and cooperative throughout the session using time effectively to discuss thoughts and feelings. Patient voices continued motivation for treatment and understanding of mood and anxiety issues. Patient is likely to benefit from future treatment because she remains motivated to decrease symptoms and improve functioning and reports benefit of regular sessions in addressing symptoms.   Interventions:  Health Education Established psychological safety. Checked in with patient regarding her week. Reviewed previous session regarding time management. Engaged patient in processing current psychosocial stressors, overall static symptoms. Provided information on Covid vaccines, per her request for information and increased anxiety due to access issues. Provided support through active listening, validation of feelings, and highlighted patient's strengths.   Diagnosis:   ICD-10-CM   1. Generalized  anxiety disorder  F41.1     2. Bipolar 2 disorder (HCC)  F31.81      Plan: Patient's goal is: Help with anxiety and working through things. She reports benefit from having someone to listen to her.   Treatment Target: Understand the relationship between thoughts, emotions, and behaviors Psychoeducation on CBT model   Teach the connection between thoughts, emotions, and behaviors Treatment Target: Increase realistic balanced thinking Explore patient's thoughts, beliefs, automatic thoughts, assumptions Identify unhelpful thinking patterns Process distress and allow for emotional release Questioning and challenging thoughts Cognitive reappraisal  Core beliefs   Treatment Target: Increase coping skills Mindfulness practices Deep breathing STOP technique Grounding exercises Treatment Target: Reducing vulnerability to "emotional mind" Values clarification   Self-care - nutrition, sleep, exercise Increase living within values  Teach distress tolerance techniques - "what helps me"    Treatment Target: Increase knowledge of child development and positive parenting practices   Through use of Triple P, increase parenting confidence and build awareness of child development, and increase parenting skills Provide psychoeducation on Triple P positive parenting Explore current parenting strategies Provide opportunity to explore interventions from Triple P that can support patient in effective parenting practices Parenting coaching for developing positive relationship with patient, encouraging desirable behavior, developing clear boundaries and behavioral expectations, providing effective commands and managing misbehavior. Teach behavior management techniques from Triple P to reduce negative behaviors, including consistency, age-appropriate expectations, prompting positive behaviors, using praise and rewards, using clear instructions, and natural consequences to help with listening.  Future  Appointments  Date Time Provider Department Center  04/14/2021  2:00 PM Sherren Kerns, PT [facility] None  04/15/2021 10:20 AM  Gabriel Cirri, NP [facility]  04/28/2021  2:00 PM Sherren Kerns, PT [facility] None  05/05/2021  2:00 PM Sherren Kerns, PT [facility] None  05/12/2021  2:00 PM Sherren Kerns, PT [facility] None     Kathreen Cosier, LCSW

## 2021-04-12 DIAGNOSIS — M25671 Stiffness of right ankle, not elsewhere classified: Secondary | ICD-10-CM | POA: Diagnosis not present

## 2021-04-12 DIAGNOSIS — M25571 Pain in right ankle and joints of right foot: Secondary | ICD-10-CM | POA: Diagnosis not present

## 2021-04-14 ENCOUNTER — Ambulatory Visit: Payer: Medicaid Other

## 2021-04-14 ENCOUNTER — Other Ambulatory Visit: Payer: Self-pay

## 2021-04-14 DIAGNOSIS — R278 Other lack of coordination: Secondary | ICD-10-CM

## 2021-04-14 DIAGNOSIS — N9419 Other specified dyspareunia: Secondary | ICD-10-CM | POA: Diagnosis not present

## 2021-04-14 DIAGNOSIS — M533 Sacrococcygeal disorders, not elsewhere classified: Secondary | ICD-10-CM | POA: Diagnosis not present

## 2021-04-14 DIAGNOSIS — N393 Stress incontinence (female) (male): Secondary | ICD-10-CM | POA: Diagnosis not present

## 2021-04-14 NOTE — Therapy (Signed)
[place] [facility] SERVICES [address] [place], [place], [address] Phone: [phone]   Fax:  [phone]  Physical Therapy Treatment  Patient Details  Name: Janet Rich MRN: [id number] Date of Birth: [birth year] Referring Provider (PT): Hassan Buckler, CNM   Encounter Date: 04/14/2021   PT End of Session - 04/14/21 1453     Visit Number 5    Number of Visits 10    Date for PT Re-Evaluation 06/06/21    Authorization Type [employer]    Authorization Time Period 03/24/21-06/06/21    Authorization - Visit Number 5    Authorization - Number of Visits 10    PT Start Time 0254   pt arrived early   PT Stop Time 1448    PT Time Calculation (min) 56 min    Activity Tolerance Patient tolerated treatment well;No increased pain    Behavior During Therapy WFL for tasks assessed/performed             Past Medical History:  Diagnosis Date   Anemia    with periods   Ankle fracture    right   Anxiety    Asthma    Back pain    Depression    Bipolar    Endometriosis    GERD (gastroesophageal reflux disease)    Headache    Ovarian cyst    PCOS (polycystic ovarian syndrome)    Pneumonia    age 25   PONV (postoperative nausea and vomiting)    nausea after wisdom teeth extraction   Vaginal Pap smear, abnormal    bx ok    Past Surgical History:  Procedure Laterality Date   FRACTURE SURGERY     ankle surgery with impant    HYSTEROSCOPY WITH D & C N/A 09/22/2019   Procedure: DILATATION AND CURETTAGE /HYSTEROSCOPY;  Surgeon: Ward, Honor Loh, MD;  Location: [facility];  Service: Gynecology;  Laterality: N/A;   ROBOTIC ASSISTED DIAGNOSTIC LAPAROSCOPY N/A 02/18/2017   Procedure: ROBOTIC ASSISTED DIAGNOSTIC LAPAROSCOPY,EXCISION AND ABLATION OF ENDOMETRIOSIS,LYSIS OF ADHESIONS;  Surgeon: Brien Few, MD;  Location: [facility];  Service: Gynecology;  Laterality: N/A;   VAGINAL DELIVERY N/A 08/14/2020   Procedure: VAGINAL DELIVERY;  Surgeon:  Ouida Sills Gwen Her, MD;  Location: [facility];  Service: Obstetrics;  Laterality: N/A;   [place] EXTRACTION  2013    There were no vitals filed for this visit.   Subjective Assessment - 04/14/21 1355     Subjective Pt reported she twisted her back when she picked up one of her twins this morning. She now has pain in abdomen and back. Pt states leakage is about the same and HEP went "moderately ok". She hurt her back when bending forward to grab eggs on Saturday morning and feels like a pinched nerve.    Pertinent History PCOS, endometriosis, Bipolar disorder, hx of preeclampsia (twin pregnancy), IBS, asthma, R trigeminal neuralgia (still occuring), HTN, vulvar atrophy, anxiety, hx of scoliosis (age [age]-did not use brace but has gone to chiropractor.    Patient Stated Goals "I'd like for sex to not hurt, for the speculum not to hurt, and to not leak all the time."    Currently in Pain? Yes    Pain Score 5     Pain Location Abdomen    Pain Orientation Left    Pain Descriptors / Indicators Dull;Aching    Pain Type Acute pain    Pain Onset Today    Pain Frequency Constant  Aggravating Factors  Certain positions, breathing too deep    Pain Relieving Factors Gently rubbing area of pain    Multiple Pain Sites Yes    Pain Score 5   5/10 seated but 8/10 with bending   Pain Location Back    Pain Orientation Lower    Pain Descriptors / Indicators Aching;Dull    Pain Type Chronic pain    Pain Onset More than a month ago    Pain Frequency Constant    Aggravating Factors  bending forward    Pain Relieving Factors seated or lying down                            Pelvic Floor Special Questions - 04/14/21 1457     Pelvic Floor Internal Exam yes    Exam Type Vaginal    Sensation intact    Palpation Incr. tension in B levator ani and L OI with trigger points.    Strength fair squeeze, definite lift    Strength # of reps 10    Strength # of seconds 3             NMR: Access Code: [email or web address] URL: [email or web address] Date: 04/14/2021 Prepared by: Geoffry Paradise  Exercises (REVIEWED and discussed most important to perform) Child's Pose with Sidebending - 1 x daily - 7 x weekly - 1 sets - 3 reps - 30-60 hold Sidelying Thoracic Rotation with Open Book - 1 x daily - 7 x weekly - 3 sets - 10 reps Latissimus Dorsi Stretch at Wall - 1 x daily - 7 x weekly - 1 sets - 3 reps - 30 hold Supine Piriformis Stretch - 1 x daily - 7 x weekly - 1 sets - 3 reps - 30 hold Supine Pelvic Floor Contraction - 1 x daily - 7 x weekly - 3 sets - 10 reps Supine Diaphragmatic Breathing - 1 x daily - 7 x weekly - 1 sets - 5 reps   Patient Education (NEW)  Healthy Posture: How to Hold and Lift a Tour manager and Lowering Baby to a Changing Table Squat Lift with a [employer] Activities Lifting Techniques Sleep Positions   [facility] Adult PT Treatment/Exercise - 04/14/21 1458       Manual Therapy   Manual Therapy Joint mobilization;Soft tissue mobilization;Internal Pelvic Floor    Manual therapy comments Pt reported decr. abdominal pain to 3/10, no back pain during sit to stand txf, and decr. tension in pelvic floor after manual therapy. PT encouraged pt to focus on breath during manual therapy to relax musculature.    Joint Mobilization T12-L4, grade 2, 2x30 sec. PA joint mobs with pt in seated position 2/2 abdominal pain.    Soft tissue mobilization PT massaged B Lx spine paraspinals.    Internal Pelvic Floor Trigger point release to B levator ani and L OI.                  SELF CARE:  PT Education - 04/14/21 1451     Education Details PT educated pt on lifting and posture handout and contracting pelvic floor during sit<>stand txfs and prior to coughing, sneezing, or lifting. PT discussed benefits of internal muscle assessment and pt agreeable. PT educated pt on using diaphragmatic breathing when stressed and to relax PF  musculature. PT educated pt on log rolling technique during supine to sit to decr. back pain.    Person(s) Educated  Patient    Methods Explanation;Demonstration;Verbal cues;Handout    Comprehension Returned demonstration;Verbalized understanding              PT Short Term Goals - 04/07/21 1623       PT SHORT TERM GOAL #1   Title Pt will be IND in all HEP in order to improve pain, flexibility and pain. TARGET DATE FOR ALL STGS: 3 TREATMENTS    Baseline No HEP    Status Partially Met      PT SHORT TERM GOAL #2   Title Pt will verbalize proper lifting and posture techniques in order to reduce pain and care for children.    Baseline Pt unable to verbalize proper lifting or posture techniques.    Status Partially Met      PT SHORT TERM GOAL #3   Title Have pt take FOTO survery and write goals as indicated.    Baseline 03/31/21: 71 PFDI pain, 52 urinary problem, 75 PFDI urinary    Status Achieved               PT Long Term Goals - 03/31/21 1642       PT LONG TERM GOAL #1   Title Pt will be IND in progressed HEP in order to improve strength, pain, continence and flexibility. TARGET DATE FOR ALL LTGS: AFTER 9 TREATMENTS    Baseline No HEP.    Status New      PT LONG TERM GOAL #2   Title Pt will report she is able to stand for 30 minutes without incr. in back/hip pain in order to cook at home.    Baseline 10-15 minutes prior to onset of pain    Status New      PT LONG TERM GOAL #3   Title Pt will change pants </=1 time per week 2/2 stress urinary incontinence.    Baseline 7 times a week    Status New      PT LONG TERM GOAL #4   Title Pt will report no pain during intercourse or speculum insertion during OBGYN exam.    Baseline 7/10 pain during/after intercourse and speculum insertion    Status New      PT LONG TERM GOAL #5   Title Pt will improve PFDI pain score to </=48 to improve pain and QOL.    Baseline 71    Status New                   Plan -  04/14/21 1453     Clinical Impression Statement Pt demonstrated progress as she reported abdominal pain decr. to 3/10 after manual therapy and back pain during sit to stand txfs (with pelvic floor contraction) decr. to 0/10 at end of session. Pt's trigger points in levator ani (R and L side) and L OI decr. with manual therapy. Pt continues experience pelvic/back pain and incontinence and would continue to benefit from skilled PT to improve pain, leakage, and strength during all ADLs.    Comorbidities PCOS, endometriosis, Bipolar disorder, hx of preeclampsia (twin pregnancy), IBS, asthma, R trigeminal neuralgia (still occuring), HTN, vulvar atrophy, anxiety, hx of scoliosis (age [age]-did not use brace but has gone to chiropractor.    PT Frequency 1x / week    PT Duration Other (comment)   10 weeks   PT Treatment/Interventions ADLs/Self Care Home Management;Biofeedback;Neuromuscular re-education;Balance training;Therapeutic exercise;Therapeutic activities;Functional mobility training;Stair training;Gait training;Patient/family education;Manual techniques;Joint Manipulations;Spinal Manipulations;Dry needling    PT Next Visit Plan review HEP  and progress as tolerated. Manual therapy of QL, LD, hip musculature prn. Internal exam. Trigger point release of psoas, assess mobility in prone and treat as indicated.    PT Home Exercise Plan Medbridge:[email or web address]             Patient will benefit from skilled therapeutic intervention in order to improve the following deficits and impairments:  Abnormal gait, Decreased coordination, Decreased activity tolerance, Decreased balance, Decreased mobility, Decreased strength, Postural dysfunction, Impaired flexibility, Improper body mechanics, Pain  Visit Diagnosis: Stress incontinence (female) (female)  Other lack of coordination  Dyspareunia due to medical condition in female  Sacrococcygeal disorders, not elsewhere classified     Problem List Patient Active  Problem List   Diagnosis Date Noted   Myalgia 03/11/2021   Elevated BP without diagnosis of hypertension 01/08/2021   Dichorionic diamniotic twin pregnancy in third trimester 08/14/2020   Preeclampsia 08/14/2020   Twin pregnancy delivered vaginally 08/14/2020   Preterm labor 06/17/2020   Right trigeminal neuralgia 11/08/2019   Endometriosis 03/27/2019   PCOS (polycystic ovarian syndrome) 03/27/2019   Moderate persistent asthma 03/07/2019   Post-nasal drainage 03/07/2019   Irritable bowel syndrome with diarrhea 03/07/2019   Gastroesophageal reflux disease without esophagitis 03/07/2019   Class 2 obesity due to excess calories without serious comorbidity with body mass index (BMI) of 38.0 to 38.9 in adult 09/02/2018   Bipolar 2 disorder ([place]) 08/12/2018   History of low birth weight 03/11/2018   Vulvar atrophy 03/11/2018   History of migraine headaches 05/01/2015   Anxiety and depression 05/01/2015    Aden Youngman L 04/14/2021, 3:00 PM  [place] [address] [place], [place], [address] Phone: [phone]   Fax:  [phone]  Name: Janet Rich MRN: [id number] Date of Birth: 21-Jul-1996  Geoffry Paradise, PT,DPT 04/14/21 3:01 PM Phone: [phone] Fax: [phone]

## 2021-04-14 NOTE — Patient Instructions (Signed)
Access Code: [email or web address] URL: [email or web address] Date: 04/14/2021 Prepared by: Zerita Boers  Exercises Child's Pose with Sidebending - 1 x daily - 7 x weekly - 1 sets - 3 reps - 30-60 hold Sidelying Thoracic Rotation with Open Book - 1 x daily - 7 x weekly - 3 sets - 10 reps Latissimus Dorsi Stretch at Wall - 1 x daily - 7 x weekly - 1 sets - 3 reps - 30 hold Supine Piriformis Stretch - 1 x daily - 7 x weekly - 1 sets - 3 reps - 30 hold Supine Pelvic Floor Contraction - 1 x daily - 7 x weekly - 3 sets - 10 reps Supine Diaphragmatic Breathing - 1 x daily - 7 x weekly - 1 sets - 5 reps  Patient Education Healthy Posture: How to Hold and Lift a Futures trader and Lowering Baby to a Changing Table Squat Lift with a [employer] Sleep Positions

## 2021-04-15 ENCOUNTER — Encounter: Payer: Self-pay | Admitting: Unknown Physician Specialty

## 2021-04-15 ENCOUNTER — Ambulatory Visit: Payer: Medicaid Other | Admitting: Licensed Clinical Social Worker

## 2021-04-15 ENCOUNTER — Ambulatory Visit: Payer: Medicaid Other | Admitting: Unknown Physician Specialty

## 2021-04-15 VITALS — BP 122/86 | HR 83 | Temp 97.7°F | Resp 16 | Ht 61.0 in | Wt 208.6 lb

## 2021-04-15 DIAGNOSIS — J4540 Moderate persistent asthma, uncomplicated: Secondary | ICD-10-CM | POA: Diagnosis not present

## 2021-04-15 DIAGNOSIS — M25671 Stiffness of right ankle, not elsewhere classified: Secondary | ICD-10-CM | POA: Diagnosis not present

## 2021-04-15 DIAGNOSIS — M791 Myalgia, unspecified site: Secondary | ICD-10-CM | POA: Diagnosis not present

## 2021-04-15 DIAGNOSIS — F3181 Bipolar II disorder: Secondary | ICD-10-CM

## 2021-04-15 DIAGNOSIS — F411 Generalized anxiety disorder: Secondary | ICD-10-CM

## 2021-04-15 DIAGNOSIS — M25571 Pain in right ankle and joints of right foot: Secondary | ICD-10-CM | POA: Diagnosis not present

## 2021-04-15 MED ORDER — LATUDA 20 MG PO TABS: 20.0000 mg | ORAL_TABLET | Freq: Every day | ORAL | 3 refills | Status: DC

## 2021-04-15 NOTE — Assessment & Plan Note (Signed)
Unable to tell what insurance is using to deny Symbicort and how it need to be filed.  Will ask clinical pharmacy consult

## 2021-04-15 NOTE — Progress Notes (Signed)
Counselor/Therapist Progress Note  [name], MRN: [id number],    Date: 04/15/2021  Time Spent: 41 minutes   Treatment Type: Individual Therapy  Reported Symptoms:  current mood stability  Mental Status Exam:  Appearance:   Casual     Behavior:  Appropriate and Sharing  Motor:  Normal  Speech/Language:   Normal Rate  Affect:  Appropriate, Congruent, and Full Range  Mood:  euthymic  Thought process:  goal directed  Thought content:    WNL  Sensory/Perceptual disturbances:    WNL  Orientation:  oriented to person, place, time/date, and situation  Attention:  Good  Concentration:  Good  Memory:  WNL  Fund of knowledge:   Good  Insight:    Good  Judgment:   Good  Impulse Control:  Good   Risk Assessment: Danger to Self:  No Self-injurious Behavior: No Danger to Others: No Duty to Warn:no Physical Aggression / Violence:No  Access to Firearms a concern: No  Gang Involvement:No   Subjective: Patient was engaged and cooperative throughout the session using time effectively to discuss thoughts and feelings. Patient voices continued motivation for treatment and understanding of mood and anxiety. Patient is likely to benefit from future treatment because she remains motivated to decrease mood and anxiety and reports benefit of regular sessions in addressing these symptoms.   Interventions: Cognitive Behavioral Therapy Established psychological safety. Engaged patient in processing current psychosocial stressors, marital conflict leading to increased emotionality. Provided supportive space encouraging emotional release and processing of current psychosocial stressors. Encouraged patient to continue mindfulness practices and to increase self-care. Provided support through active listening, validation of feelings, and highlighted patient's strengths.   Diagnosis:   ICD-10-CM   1. Generalized anxiety disorder  F41.1     2. Bipolar 2 disorder (HCC)  F31.81       Plan: Patient's goal is: Help with anxiety and working through things. She reports benefit from having someone to listen to her.   Treatment Target: Understand the relationship between thoughts, emotions, and behaviors Psychoeducation on CBT model   Teach the connection between thoughts, emotions, and behaviors Treatment Target: Increase realistic balanced thinking Explore patient's thoughts, beliefs, automatic thoughts, assumptions Identify unhelpful thinking patterns Process distress and allow for emotional release Questioning and challenging thoughts Cognitive reappraisal  Core beliefs   Treatment Target: Increase coping skills Mindfulness practices Deep breathing STOP technique Grounding exercises Treatment Target: Reducing vulnerability to "emotional mind" Values clarification   Self-care - nutrition, sleep, exercise Increase living within values  Teach distress tolerance techniques - "what helps me"    Treatment Target: Increase knowledge of child development and positive parenting practices   Through use of Triple P, increase parenting confidence and build awareness of child development, and increase parenting skills Provide psychoeducation on Triple P positive parenting Explore current parenting strategies Provide opportunity to explore interventions from Triple P that can support patient in effective parenting practices Parenting coaching for developing positive relationship with patient, encouraging desirable behavior, developing clear boundaries and behavioral expectations, providing effective commands and managing misbehavior. Teach behavior management techniques from Triple P to reduce negative behaviors, including consistency, age-appropriate expectations, prompting positive behaviors, using praise and rewards, using clear instructions, and natural consequences to help with listening.  Future Appointments  Date Time Provider Department Center  04/23/2021 11:00 AM  Kathreen Cosier, LCSW [facility] None  04/28/2021  2:00 PM Sherren Kerns, PT [facility] None  05/05/2021  2:00 PM Sherren Kerns, PT [facility] None  05/12/2021  2:00 PM Sherren Kerns, PT [facility] None  05/19/2021  2:00 PM Sherren Kerns, PT [facility] None  05/26/2021  2:00 PM Sherren Kerns, PT [facility] None  05/27/2021  9:00 AM Danelle Berry, PA-C [facility]  06/02/2021  2:00 PM Sherren Kerns, PT [facility] None  06/09/2021  2:00 PM Sherren Kerns, PT [facility] None    Kathreen Cosier, LCSW

## 2021-04-15 NOTE — Patient Instructions (Signed)
Vit B12 supplement 250 mcgs  Vit D 2,000 IUs/day  Magnesium Malate take in the evening

## 2021-04-15 NOTE — Assessment & Plan Note (Signed)
With elevated Sed rate.  Will refer to rheumatology for further management.

## 2021-04-15 NOTE — Progress Notes (Signed)
BP 122/86   Pulse 83   Temp 97.7 F (36.5 C)   Resp 16   Ht _0  (1.549 m)   Wt 208 lb 9.6 oz (94.6 kg)   LMP 04/08/2021   SpO2 99%   BMI 39.41 kg/m    Subjective:    [name], female    DOB: January 21, 1996, 25 y.o.   MRN: [id number]  HPI: Janet Rich is a 25 y.o. female  Chief Complaint  Patient presents with   Follow-up   myalgias   Myalgias Pt with generalized muscle pain for the last 2 years.  Feels like arms and legs are "on fire."  Upper legs it's the worst.  Describes it as a deep and aching pain like "growing pains."  Unable to take anti-depressants  Bipolar Was given Latuda.  Psychiatrist cannot rx.  Asking if I can  Asthma Unable to get Symbicort apprved.  Told it needs to be filed under "medical equipment."  Therefore, taking Albuterol daily.    Depression screen [facility] 2/9 04/15/2021 03/11/2021 01/08/2021 01/07/2021 11/08/2019  Decreased Interest _1 0 1  Down, Depressed, Hopeless _2 0 1  PHQ - 2 Score _3 0 2  Altered sleeping 0 0 0 1 1  Tired, decreased energy _4 0 1  Change in appetite 0 0 3 1 0  Feeling bad or failure about yourself  0 2 1 0 0  Trouble concentrating 0 0 0 0 0  Moving slowly or fidgety/restless 0 0 0 0 0  Suicidal thoughts 0 0 0 0 0  PHQ-9 Score _5 Difficult doing work/chores Not difficult at all - Very difficult Not difficult at all Somewhat difficult  Some recent data might be hidden     Relevant past medical, surgical, family and social history reviewed and updated as indicated. Interim medical history since our last visit reviewed. Allergies and medications reviewed and updated.  Review of Systems  Per HPI unless specifically indicated above     Objective:    BP 122/86   Pulse 83   Temp 97.7 F (36.5 C)   Resp 16   Ht _6  (1.549 m)   Wt 208 lb 9.6 oz (94.6 kg)   LMP 04/08/2021   SpO2 99%   BMI 39.41 kg/m   Wt Readings from Last 3 Encounters:  04/15/21 208 lb 9.6 oz (94.6 kg)   03/11/21 207 lb 3.2 oz (94 kg)  02/10/21 206 lb 12.7 oz (93.8 kg)    Physical Exam Constitutional:      General: She is not in acute distress.    Appearance: Normal appearance. She is well-developed.  HENT:     Head: Normocephalic and atraumatic.  Eyes:     General: Lids are normal. No scleral icterus.       Right eye: No discharge.        Left eye: No discharge.     Conjunctiva/sclera: Conjunctivae normal.  Cardiovascular:     Rate and Rhythm: Normal rate.  Pulmonary:     Effort: Pulmonary effort is normal.  Abdominal:     Palpations: There is no hepatomegaly or splenomegaly.  Musculoskeletal:        General: Normal range of motion.  Skin:    Coloration: Skin is not pale.     Findings: No rash.  Neurological:     Mental Status: She is alert and oriented to person, place, and time.  Psychiatric:        Behavior: Behavior normal.        Thought Content: Thought content normal.        Judgment: Judgment normal.    Results for orders placed or performed in visit on 03/11/21  Antinuclear Antib (ANA)  Result Value Ref Range   Anti Nuclear Antibody (ANA) NEGATIVE NEGATIVE  Sed Rate (ESR)  Result Value Ref Range   Sed Rate 38 (H) 0 - 20 mm/h  Vitamin B12  Result Value Ref Range   Vitamin B-12 376 200 - 1,100 pg/mL  Comprehensive metabolic panel  Result Value Ref Range   Glucose, Bld 87 65 - 99 mg/dL   BUN 8 7 - 25 mg/dL   Creat 0.49 (L) 0.50 - 1.10 mg/dL   BUN/Creatinine Ratio 16 6 - 22 (calc)   Sodium 136 135 - 146 mmol/L   Potassium 4.4 3.5 - 5.3 mmol/L   Chloride 101 98 - 110 mmol/L   CO2 27 20 - 32 mmol/L   Calcium 9.5 8.6 - 10.2 mg/dL   Total Protein 7.1 6.1 - 8.1 g/dL   Albumin 4.1 3.6 - 5.1 g/dL   Globulin 3.0 1.9 - 3.7 g/dL (calc)   AG Ratio 1.4 1.0 - 2.5 (calc)   Total Bilirubin 0.4 0.2 - 1.2 mg/dL   Alkaline phosphatase (APISO) 77 31 - 125 U/L   AST 12 10 - 30 U/L   ALT 16 6 - 29 U/L  CBC with Differential/Platelet  Result Value Ref Range   WBC 7.0  3.8 - 10.8 Thousand/uL   RBC 4.91 3.80 - 5.10 Million/uL   Hemoglobin 13.5 11.7 - 15.5 g/dL   HCT 41.1 35.0 - 45.0 %   MCV 83.7 80.0 - 100.0 fL   MCH 27.5 27.0 - 33.0 pg   MCHC 32.8 32.0 - 36.0 g/dL   RDW 13.6 11.0 - 15.0 %   Platelets 358 140 - 400 Thousand/uL   MPV 9.7 7.5 - 12.5 fL   Neutro Abs 3,969 1,500 - 7,800 cells/uL   Lymphs Abs 2,590 850 - 3,900 cells/uL   Absolute Monocytes 322 200 - 950 cells/uL   Eosinophils Absolute 112 15 - 500 cells/uL   Basophils Absolute 7 0 - 200 cells/uL   Neutrophils Relative % 56.7 %   Total Lymphocyte 37.0 %   Monocytes Relative 4.6 %   Eosinophils Relative 1.6 %   Basophils Relative 0.1 %  B. burgdorfi antibodies  Result Value Ref Range   B burgdorferi Ab IgG+IgM <0.90 index      Assessment & Plan:   Problem List Items Addressed This Visit       Unprioritized   Bipolar 2 disorder ([place])    Rx for Latuda per psychiatry that they can't prescribe       Moderate persistent asthma    Unable to tell what insurance is using to deny Symbicort and how it need to be filed.  Will ask clinical pharmacy consult       Relevant Orders   AMB Referral to [facility]   Myalgia - Primary    With elevated Sed rate.  Will refer to rheumatology for further management.         Relevant Orders   Ambulatory referral to Rheumatology     Follow up plan: Return in about 6 weeks (around 05/27/2021).

## 2021-04-15 NOTE — Assessment & Plan Note (Signed)
Rx for Latuda per psychiatry that they can't prescribe

## 2021-04-18 DIAGNOSIS — Z419 Encounter for procedure for purposes other than remedying health state, unspecified: Secondary | ICD-10-CM | POA: Diagnosis not present

## 2021-04-23 ENCOUNTER — Ambulatory Visit: Payer: Medicaid Other | Admitting: Licensed Clinical Social Worker

## 2021-04-23 DIAGNOSIS — F411 Generalized anxiety disorder: Secondary | ICD-10-CM

## 2021-04-23 DIAGNOSIS — F3181 Bipolar II disorder: Secondary | ICD-10-CM

## 2021-04-23 NOTE — Progress Notes (Signed)
Counselor/Therapist Progress Note  [name], MRN: [id number],    Date: 04/23/2021  Time Spent: 45 minutes    Treatment Type: Psychotherapy  Reported Symptoms:  Static symptoms  Mental Status Exam:  Appearance:   Fairly Groomed     Behavior:  Appropriate and Sharing  Motor:  Normal  Speech/Language:   Clear and Coherent and Normal Rate  Affect:  Appropriate, Congruent, and Full Range  Mood:  normal  Thought process:  goal directed  Thought content:    WNL  Sensory/Perceptual disturbances:    WNL  Orientation:  oriented to person, place, time/date, situation, and day of week  Attention:  Good  Concentration:  Good  Memory:  WNL  Fund of knowledge:   Good  Insight:    Good  Judgment:   Good  Impulse Control:  Good   Risk Assessment: Danger to Self:  No Self-injurious Behavior: No Danger to Others: No Duty to Warn:no Physical Aggression / Violence:No  Access to Firearms a concern: No  Gang Involvement:No   Subjective: Patient was engaged and cooperative throughout the session using time effectively to discuss thoughts and feelings. Patient voices continued motivation for treatment and understanding of mood and anxiety issues. She continues to report use of coping skills to manage symptoms, including engaging in value driven activities. Patient is likely to benefit from future treatment because she remains motivated to manage symptoms and reports benefit of regular sessions in addressing mood and anxiety symptoms.     Interventions: Cognitive Behavioral Therapy Established psychological safety. Engaged patient in processing current psychosocial stressors, overall static symptoms. Identified and discussed behaviors consistent with values and planned to flexibility related to this goal driven behavior. Reviewed the need for gratitude and self-compassion. Provided support through active listening, validation of feelings, and highlighted patient's strengths.    Diagnosis:   ICD-10-CM   1. Generalized anxiety disorder  F41.1     2. Bipolar 2 disorder (HCC)  F31.81      Plan:  Patient's goal is: Help with anxiety and working through things. She reports benefit from having someone to listen to her.   Treatment Target: Increase realistic balanced thinking Explore patient's thoughts, beliefs, automatic thoughts, assumptions Identify unhelpful thinking patterns Process distress and allow for emotional release Questioning and challenging thoughts Cognitive reappraisal   Treatment Target: Increase coping skills Mindfulness practices Deep breathing STOP technique Grounding exercises Treatment Target: Reducing vulnerability to "emotional mind" Values clarification   Self-care - nutrition, sleep, exercise Increase living within values  Teach distress tolerance techniques - "what helps me"    Treatment Target: Increase knowledge of child development and positive parenting practices Through use of Triple P, increase parenting confidence and build awareness of child development, and increase parenting skills Provide psychoeducation on Triple P positive parenting Explore current parenting strategies Provide opportunity to explore interventions from Triple P that can support patient in effective parenting practices Parenting coaching for developing positive relationship with patient, encouraging desirable behavior, developing clear boundaries and behavioral expectations, providing effective commands and managing misbehavior. Teach behavior management techniques from Triple P to reduce negative behaviors, including consistency, age-appropriate expectations, prompting positive behaviors, using praise and rewards, using clear instructions, and natural consequences to help with listening.  Future Appointments  Date Time Provider Department Center  04/28/2021  2:00 PM Sherren Kerns, PT [facility] None  04/29/2021  3:00 PM Kathreen Cosier, LCSW [facility]  None  05/05/2021  2:00 PM Sherren Kerns, PT [facility] None  05/12/2021  2:00  PM Sherren Kerns, PT [facility] None  05/19/2021  2:00 PM Sherren Kerns, PT [facility] None  05/26/2021  2:00 PM Sherren Kerns, PT [facility] None  05/27/2021  9:00 AM Danelle Berry, PA-C [facility]  06/02/2021  2:00 PM Sherren Kerns, PT [facility] None  06/09/2021  2:00 PM Sherren Kerns, PT [facility] None    Kathreen Cosier, LCSW

## 2021-04-24 DIAGNOSIS — G8929 Other chronic pain: Secondary | ICD-10-CM | POA: Diagnosis not present

## 2021-04-24 DIAGNOSIS — R102 Pelvic and perineal pain: Secondary | ICD-10-CM | POA: Diagnosis not present

## 2021-04-24 DIAGNOSIS — N809 Endometriosis, unspecified: Secondary | ICD-10-CM | POA: Diagnosis not present

## 2021-04-28 ENCOUNTER — Ambulatory Visit: Payer: Medicaid Other

## 2021-04-29 ENCOUNTER — Ambulatory Visit: Payer: Medicaid Other | Admitting: Licensed Clinical Social Worker

## 2021-04-29 DIAGNOSIS — F411 Generalized anxiety disorder: Secondary | ICD-10-CM

## 2021-04-29 DIAGNOSIS — F3181 Bipolar II disorder: Secondary | ICD-10-CM

## 2021-04-29 NOTE — Progress Notes (Signed)
Counselor/Therapist Progress Note  [name], MRN: [id number],    Date: 04/29/2021  Time Spent: 46 minutes    Treatment Type: Psychotherapy  Reported Symptoms:  low mood, sadness, increased emotionality, secondary to upcoming procedure  Mental Status Exam:  Appearance:   Casual     Behavior:  Appropriate and Sharing  Motor:  Normal  Speech/Language:   Clear and Coherent and Normal Rate  Affect:  Appropriate, Congruent, and Full Range  Mood:  euthymic  Thought process:  goal directed  Thought content:    WNL  Sensory/Perceptual disturbances:    WNL  Orientation:  oriented to person, place, time/date, and situation  Attention:  Good  Concentration:  Good  Memory:  WNL  Fund of knowledge:   Good  Insight:    Good  Judgment:   Good  Impulse Control:  Good   Risk Assessment: Danger to Self:  No Self-injurious Behavior: No Danger to Others: No Duty to Warn:no Physical Aggression / Violence:No  Access to Firearms a concern: No  Gang Involvement:No   Subjective: Patient was engaged and cooperative throughout the session using time effectively to discuss thoughts and feelings. Patient voices continued motivation for treatment and understanding of depression and anxiety. Patient is likely to benefit from future treatment because she remains motivated to use coping skills and decrease symptoms.   Interventions: Cognitive Behavioral Therapy Established psychological safety. Checked in with patient regarding her week. Engaged patient in processing current psychosocial stressors - multiple stressors leading to increased emotions and mood instability. Explored patient's perceptions of hysterectomy, validating patient's feelings of ambivalence and challenged unhelpful thoughts. Provided support through active listening, validation of feelings, and highlighted patient's strengths.    Diagnosis:   ICD-10-CM   1. Generalized anxiety disorder  F41.1     2. Bipolar 2  disorder (HCC)  F31.81      Plan: Patient's goal is: Help with anxiety and working through things. She reports benefit from having someone to listen to her.   Treatment Target: Increase realistic balanced thinking Explore patient's thoughts, beliefs, automatic thoughts, assumptions Identify unhelpful thinking patterns Process distress and allow for emotional release Questioning and challenging thoughts Cognitive reappraisal   Treatment Target: Increase coping skills Mindfulness practices Deep breathing STOP technique Grounding exercises Treatment Target: Reducing vulnerability to "emotional mind" Values clarification   Self-care - nutrition, sleep, exercise Increase living within values  Teach distress tolerance techniques - "what helps me"   Future Appointments  Date Time Provider Department Center  05/02/2021 10:00 AM [facility] PAT1 [facility] None  05/05/2021 12:30 PM [facility] PHARMACIST [facility] None  05/05/2021  2:00 PM Zerita Boers L, PT [facility] None  05/07/2021  1:00 PM Kathreen Cosier, LCSW [facility] None  05/12/2021  2:00 PM Sherren Kerns, PT [facility] None  05/19/2021  2:00 PM Sherren Kerns, PT [facility] None  05/26/2021  2:00 PM Sherren Kerns, PT [facility] None  05/27/2021  9:00 AM Danelle Berry, PA-C [facility]  06/02/2021  2:00 PM Sherren Kerns, PT [facility] None  06/09/2021  2:00 PM Sherren Kerns, PT [facility] None   Kathreen Cosier, LCSW

## 2021-04-30 DIAGNOSIS — G8929 Other chronic pain: Secondary | ICD-10-CM | POA: Diagnosis not present

## 2021-04-30 DIAGNOSIS — R102 Pelvic and perineal pain: Secondary | ICD-10-CM | POA: Diagnosis not present

## 2021-04-30 NOTE — H&P (Signed)
Chief Complaint:   Janet Rich is a 25 y.o. female [phone] presenting with Pre Op Consulting    History of Present Illness: Pt presents for preoperative exam prior to her RA TLH with BS for her endometriosis and chronic pelvic pain.  Periods are absolutely horrible. She cannot function during them. She hates her uterus. Wants to have a hysterectomy to relieve the pain. She has tried a lot of different options, but nothing has helped her: multiple OCPs (at least 8 versions and none helped, many give  Migraines), Nexplanon made her bleed for 90 days straight. Wants to do what is best for herself, her health and her children.   Workup: Pap: 09/2020 NILM  EMBx: deferred - pt cannot tolerate   11/14/2020 TVUS: Uterus anteverted 5.7 x 5 x 3.2 cm  EE 4.5 mm LO 3 x 1.8 x 2.1 cm, 6 mL RO 3 x 1.8 x 1.9 cm, 5.3 mL No Free Fluid Seen    09/2019 D&C with hysteroscopy  02/2017 RA dx lap with excision and ablation of endometriosis and lysis of adhesions    Pertinent Hx: -3 children, 1 VAVD, 2 SVDs of female twins  -Bipolar 2 disorder  -Asthma -Hx of endometriosis, dysmenorrhea and menorrhagia              dx lap 02/2017 by Lenoard Aden, M.D.               [facility] hysteroscopy 09/2019 by Dr. Leeroy Bock Ward    -IBS w/ diarrhea  -Anxiety and depression  -Hx of PCOS  Past Medical History:  has a past medical history of Anxiety, Asthma without status asthmaticus, unspecified, Bipolar 1 disorder, depressed (CMS-HCC), Chronic migraine, Depression, Endometriosis of uterus, PCOS (polycystic ovarian syndrome), Pelvic pain in female, and Scoliosis.  Past Surgical History:  has a past surgical history that includes Pelvic laparoscopy and Dilation and curettage of uterus. Family History: family history includes Anxiety in her mother; Bipolar disorder in her mother; Depression in her mother; Endometriosis in her maternal grandmother and mother; No Known Problems in her father. Social History:  reports that  she has never smoked. She has never used smokeless tobacco. She reports previous alcohol use. She reports that she does not use drugs. OB/GYN History:  OB History     Gravida  3   Para  2   Term  1   Preterm  1   AB  1   Living  3      SAB  1   IAB      Ectopic      Molar      Multiple  1   Live Births  3         Allergies: is allergic to amoxicillin, other, and latex. Medications: Current Outpatient Medications:    ibuprofen (MOTRIN) 800 MG tablet, Take 1 tablet (800 mg total) by mouth every 6 (six) hours as needed for Pain May take 6-8 hours as needed for pain., Disp: 30 tablet, Rfl: 1   lamoTRIgine (LAMICTAL) 150 MG tablet, Take 1 tablet (150 mg total) by mouth once daily, Disp: 30 tablet, Rfl: 11   lurasidone (LATUDA) 20 mg tablet, Take 20 mg by mouth once daily, Disp: , Rfl:    norethindrone (MICRONOR) 0.35 mg tablet, Take 1 tablet (0.35 mg total) by mouth once daily, Disp: 84 tablet, Rfl: 3   SUMAtriptan (IMITREX) 25 MG tablet, Take 25-50 mg by mouth once at the onset of headache, may repeat dose  1-2 hours if still symptomatic  Max dose in 24 hours is 200 mg, Disp: , Rfl:    acetaminophen (TYLENOL ORAL), Take 500 mg by mouth, Disp: , Rfl:    albuterol 90 mcg/actuation inhaler, Inhale 2 inhalations into the lungs every 4 (four) hours as needed for Wheezing, Disp: 6.7 g, Rfl: 3   aspirin 81 MG chewable tablet, Take 81 mg by mouth once daily    (Patient not taking: No sig reported), Disp: , Rfl:    budesonide-formoteroL (SYMBICORT) 80-4.5 mcg/actuation inhaler, Inhale 2 inhalations into the lungs 2 (two) times daily (Patient not taking: No sig reported), Disp: 1 Inhaler, Rfl: 8   busPIRone (BUSPAR) 10 MG tablet, , Disp: , Rfl:    cyclobenzaprine (FLEXERIL) 10 MG tablet, , Disp: , Rfl:    doxylamine-pyridoxine, vit B6, 20-20 mg TbID, Take 1 tablet by mouth 2 (two) times daily (Patient not taking: No sig reported), Disp: 60 tablet, Rfl: 6   ferrous sulfate 325 (65  FE) MG tablet, Take 325 mg by mouth daily with breakfast (Patient not taking: No sig reported), Disp: , Rfl:    fluconazole (DIFLUCAN) 150 MG tablet, Take 1 tab PO today and repeat in 72 hours if needed (Patient not taking: No sig reported), Disp: , Rfl:    folic acid (FOLVITE) 1 MG tablet, Take 1 tablet (1 mg total) by mouth once daily (Patient not taking: No sig reported), Disp: 30 tablet, Rfl: 11   hydrOXYzine pamoate (VISTARIL) 25 MG capsule, Take by mouth 3 (three) times daily as needed (Patient not taking: No sig reported), Disp: , Rfl:    magnesium oxide (MAG-OX) 400 mg (241.3 mg magnesium) tablet, Take 1 tablet (400 mg total) by mouth once daily (Patient not taking: No sig reported), Disp: 90 tablet, Rfl: 0   niFEdipine (PROCARDIA) 10 MG capsule, Take 1 capsule (10 mg total) by mouth nightly (Patient not taking: No sig reported), Disp: 30 capsule, Rfl: 6   pantoprazole (PROTONIX) 40 MG DR tablet, Take 1 tablet (40 mg total) by mouth once daily (Patient not taking: No sig reported), Disp: 30 tablet, Rfl: 1   prenatal 25/iron fum/folic/dha (PRENATAL-1 ORAL), Take by mouth (Patient not taking: No sig reported), Disp: , Rfl:    promethazine (PHENERGAN) 25 MG tablet, Take 1 tablet (25 mg total) by mouth every 4 (four) hours as needed for Nausea (Patient not taking: No sig reported), Disp: 90 tablet, Rfl: 1   propranoloL (INDERAL) 10 MG tablet, Take 10 mg by mouth 2 (two) times daily (Patient not taking: No sig reported), Disp: , Rfl:   Review of Systems: No SOB, no palpitations or chest pain, no new lower extremity edema, no nausea or vomiting or bowel or bladder complaints. See HPI for gyn specific ROS.   Exam:   BP 116/77   Pulse 76   Ht 152.4 cm (5')   Wt 94.4 kg (208 lb 3.2 oz)   LMP 04/29/2021 (Exact Date)   Breastfeeding No   BMI 40.66 kg/m   General: Patient is well-groomed, well-nourished, appears stated age in no acute distress   HEENT: head is atraumatic and normocephalic,  trachea is midline, neck is supple with no palpable nodules   CV: Regular rhythm and normal heart rate, no murmur   Pulm: Clear to auscultation throughout lung fields with no wheezing, crackles, or rhonchi. No increased work of breathing  Abdomen: soft , no mass, non-tender, no rebound tenderness, no hepatomegaly  Pelvic: deferred   Impression:  The primary encounter diagnosis was Preoperative examination. Diagnoses of Endometriosis, Pelvic pain in female, and Chronic pelvic pain in female were also pertinent to this visit.  Plan:   1. Chronic Pelvic pain, Endometriosis  -Patient returns for a preoperative discussion regarding her plans to proceed with surgical treatment of her pelvic pain and endometriosis by XI Robotically Assisted total laparoscopic hysterectomy with bilateral salpingectomy and lysis of adhesions and possible excision of endometriosis procedure.  We may perform a cystoscopy to evaluate the urinary tract after the procedure, if surgically indicated for uro tract integrity.   The patient and I discussed the technical aspects of the procedure including the potential for risks and complications.  These include but are not limited to the risk of infection requiring post-operative antibiotics or further procedures.  We talked about the risk of injury to adjacent organs including bladder, bowel, ureter, blood vessels or nerves.  We talked about the need to convert to an open incision.  We talked about the possible need for blood transfusion.  We talked about postop complications such as thromboembolic or cardiopulmonary complications.  All of her questions were answered.  Her preoperative exam was completed and the appropriate consents were signed. She is scheduled to undergo this procedure in the near future.  She would like to stay overnight and make sure to get an abdominal binder!   Specific Peri-operative Considerations:  - Consent: obtained today - Health Maintenance:  -  Labs: CBC, CMP preoperatively - Studies: EKG, CXR preoperatively - Bowel Preparation: None required - Abx:  Ancef 2g - VTE ppx: SCDs perioperatively   Diagnoses and all orders for this visit:  Preoperative examination  Endometriosis  Pelvic pain in female  Chronic pelvic pain in female    Return for Postop check.  ~~~~~~~~~~~~~~~~~~~~~~~~~~~~~~~~~~~~~~~~~~~~~~~~~~~~~~~~~~~~ This note is partially written by Jerene Canny, in the presence of and acting as the scribe of Dr. Christeen Douglas, who has reviewed, edited and added to the note to reflect her best personal medical judgment.  This note was generated in part with voice recognition software and I apologize for any typographical errors that were not detected and corrected.    Cecilie Kicks, MD

## 2021-05-02 ENCOUNTER — Other Ambulatory Visit
Admission: RE | Admit: 2021-05-02 | Discharge: 2021-05-02 | Disposition: A | Discharge status: Home / Self Care | Payer: Medicaid Other | Source: Ambulatory Visit | Attending: Obstetrics and Gynecology | Admitting: Obstetrics and Gynecology

## 2021-05-02 ENCOUNTER — Other Ambulatory Visit: Payer: Self-pay

## 2021-05-02 HISTORY — DX: Irritable bowel syndrome, unspecified: K58.9

## 2021-05-02 HISTORY — DX: Scoliosis, unspecified: M41.9

## 2021-05-02 HISTORY — DX: Bipolar disorder, unspecified: F31.9

## 2021-05-02 NOTE — Patient Instructions (Addendum)
Your procedure is scheduled on: May 08, 2021 THURSDAY Report to the Registration Desk on the 1st floor of the [facility] To find out your arrival time, please call [phone] between 1PM - 3PM on: May 07, 2021 Wednesday   REMEMBER: Instructions that are not followed completely may result in serious medical risk, up to and including death; or upon the discretion of your surgeon and anesthesiologist your surgery may need to be rescheduled.  Do not eat food after midnight the night before surgery.  No gum chewing, lozengers or hard candies.  You may however, drink CLEAR liquids up to 2 hours before you are scheduled to arrive for your surgery. Do not drink anything within 2 hours of your scheduled arrival time.  Clear liquids include: - water  - apple juice without pulp - gatorade (not RED, PURPLE, OR BLUE) - black coffee or tea (Do NOT add milk or creamers to the coffee or tea) Do NOT drink anything that is not on this list.  Type 1 and Type 2 diabetics should only drink water. hours prior to scheduled arrival time.  TAKE THESE MEDICATIONS THE MORNING OF SURGERY WITH A SIP OF WATER: NONE  Use inhalers on the day of surgery   One week prior to surgery: Stop Anti-inflammatories (NSAIDS) such as Advil, Aleve, Ibuprofen, Motrin, Naproxen, Naprosyn and ASPIRIN OR Aspirin based products such as Excedrin, Goodys Powder, BC Powder. Stop ANY OVER THE COUNTER supplements until after surgery. You may however, continue to take Tylenol if needed for pain up until the day of surgery.  No Alcohol for 24 hours before or after surgery.  No Smoking including e-cigarettes for 24 hours prior to surgery.  No chewable tobacco products for at least 6 hours prior to surgery.  No nicotine patches on the day of surgery.  Do not use any "recreational" drugs for at least a week prior to your surgery.  Please be advised that the combination of cocaine and anesthesia may have negative outcomes, up to  and including death. If you test positive for cocaine, your surgery will be cancelled.  On the morning of surgery brush your teeth with toothpaste and water, you may rinse your mouth with mouthwash if you wish. Do not swallow any toothpaste or mouthwash.  Do not wear jewelry, make-up, hairpins, clips or nail polish.  Do not wear lotions, powders, or perfumes OR DEODORANT  Do not shave body from the neck down 48 hours prior to surgery just in case you cut yourself which could leave a site for infection.  Also, freshly shaved skin may become irritated if using the CHG soap.  Contact lenses, hearing aids and dentures may not be worn into surgery.  Do not bring valuables to the hospital. [facility] is not responsible for any missing/lost belongings or valuables.   Use CHG Soap  as directed on instruction sheet.  Notify your doctor if there is any change in your medical condition (cold, fever, infection).  Wear comfortable clothing (specific to your surgery type) to the hospital.  After surgery, you can help prevent lung complications by doing breathing exercises.  Take deep breaths and cough every 1-2 hours. Your doctor may order a device called an Incentive Spirometer to help you take deep breaths. When coughing or sneezing, hold a pillow firmly against your incision with both hands. This is called "splinting." Doing this helps protect your incision. It also decreases belly discomfort.  If you are being admitted to the hospital overnight, YOU  MAY BRING A SMALL BAG WITH YOU  If you are being discharged the day of surgery, you will not be allowed to drive home. You will need a responsible adult (18 years or older) to drive you home and stay with you that night.   If you are taking public transportation, you will need to have a responsible adult (18 years or older) with you. Please confirm with your physician that it is acceptable to use public transportation.   Please call the  Pre-admissions Testing Dept. at [phone] if you have any questions about these instructions.  Surgery Visitation Policy:  Patients undergoing a surgery or procedure may have one family member or support person with them as long as that person is not COVID-19 positive or experiencing its symptoms.  That person may remain in the waiting area during the procedure.  Inpatient Visitation:    Visiting hours are 7 a.m. to 8 p.m. Inpatients will be allowed two visitors daily. The visitors may change each day during the patient's stay. No visitors under the age of 12. Any visitor under the age of 60 must be accompanied by an adult. The visitor must pass COVID-19 screenings, use hand sanitizer when entering and exiting the patient's room and wear a mask at all times, including in the patient's room. Patients must also wear a mask when staff or their visitor are in the room. Masking is required regardless of vaccination status.

## 2021-05-05 ENCOUNTER — Other Ambulatory Visit: Payer: Self-pay

## 2021-05-05 ENCOUNTER — Ambulatory Visit: Payer: Medicaid Other | Attending: Obstetrics and Gynecology

## 2021-05-05 ENCOUNTER — Encounter
Admission: RE | Admit: 2021-05-05 | Discharge: 2021-05-05 | Disposition: A | Discharge status: Home / Self Care | Payer: Medicaid Other | Source: Ambulatory Visit | Attending: Obstetrics and Gynecology | Admitting: Obstetrics and Gynecology

## 2021-05-05 DIAGNOSIS — M533 Sacrococcygeal disorders, not elsewhere classified: Secondary | ICD-10-CM | POA: Diagnosis not present

## 2021-05-05 DIAGNOSIS — Z20822 Contact with and (suspected) exposure to covid-19: Secondary | ICD-10-CM | POA: Insufficient documentation

## 2021-05-05 DIAGNOSIS — N393 Stress incontinence (female) (male): Secondary | ICD-10-CM | POA: Diagnosis not present

## 2021-05-05 DIAGNOSIS — Z01812 Encounter for preprocedural laboratory examination: Secondary | ICD-10-CM | POA: Insufficient documentation

## 2021-05-05 DIAGNOSIS — N9419 Other specified dyspareunia: Secondary | ICD-10-CM

## 2021-05-05 DIAGNOSIS — R278 Other lack of coordination: Secondary | ICD-10-CM | POA: Diagnosis not present

## 2021-05-05 LAB — BASIC METABOLIC PANEL
Anion gap: 8 (ref 5–15)
BUN: 10 mg/dL (ref 6–20)
CO2: 28 mmol/L (ref 22–32)
Calcium: 9.1 mg/dL (ref 8.9–10.3)
Chloride: 101 mmol/L (ref 98–111)
Creatinine, Ser: 0.48 mg/dL (ref 0.44–1.00)
GFR, Estimated: >60 mL/min (ref >60)
Glucose, Bld: 81 mg/dL (ref 70–99)
Potassium: 3.9 mmol/L (ref 3.5–5.1)
Sodium: 137 mmol/L (ref 135–145)

## 2021-05-05 LAB — CBC
HCT: 41.5 % (ref 36.0–46.0)
Hemoglobin: 13.5 g/dL (ref 12.0–15.0)
MCH: 27.3 pg (ref 26.0–34.0)
MCHC: 32.5 g/dL (ref 30.0–36.0)
MCV: 84 fL (ref 80.0–100.0)
Platelets: 371 10*3/uL (ref 150–400)
RBC: 4.94 MIL/uL (ref 3.87–5.11)
RDW: 13.6 % (ref 11.5–15.5)
WBC: 7.4 10*3/uL (ref 4.0–10.5)
nRBC: 0 % (ref 0.0–0.2)

## 2021-05-05 LAB — TYPE AND SCREEN
ABO/RH(D): A POS
Antibody Screen: NEGATIVE

## 2021-05-05 LAB — SARS CORONAVIRUS 2 (TAT 6-24 HRS): SARS Coronavirus 2: NEGATIVE

## 2021-05-05 NOTE — Patient Instructions (Signed)
Visit Information  Ms. Dampier was given information about Medicaid Managed Care team care coordination services as a part of their [facility] Medicaid benefit. Zaeda Mcferran verbally consented to engagement with the [facility] Managed Care team.   For questions related to your [facility] health plan, please call: [phone] or go here:[email or web address]  If you would like to schedule transportation through your [facility] plan, please call the following number at least 2 days in advance of your appointment: [phone].  Call the [facility] Crisis Line at [phone], at any time, 24 hours a day, 7 days a week. If you are in danger or need immediate medical attention call 911.  If you would like help to quit smoking, call 1-800-QUIT-NOW (216 876 5757) OR Espaol: 1-855-Djelo-Ya (3-818-299-3716) o para ms informacin haga clic aqu or Text READY to 967-893 to register via text  Ms. Maisie Fus - following are the goals we discussed in your visit today:   Goals Addressed             This Visit's Progress    Manage My Medicine       Timeframe:  Short-Term Goal Priority:  High Start Date:                             Expected End Date:                       Follow Up Date: 06/16/21   - call for medicine refill 2 or 3 days before it runs out -Not taking meds according to prescription     Why is this important?   These steps will help you keep on track with your medicines.   Notes:         Please see education materials related to Asthma provided as print materials.   Patient verbalizes understanding of instructions provided today.   The Managed Medicaid care management team will reach out to the patient again over the next 120 days.   Artelia Laroche, Pharm.D., Managed Medicaid Pharmacist [phone]   Following is a copy of your plan of care:  Patient Care Plan: Medication Managament     Problem Identified: Health Promotion or Disease  Self-Management (General Plan of Care)      Goal: Medication Management   Note:   Current Barriers:  Does not adhere to prescribed medication regimen   Pharmacist Clinical Goal(s):  Over the next 90 days, patient will adhere to prescribed medication regimen as evidenced by conversation today  through collaboration with PharmD and provider.    Interventions: Inter-disciplinary care team collaboration (see longitudinal plan of care) Comprehensive medication review performed; medication list updated in electronic medical record     Patient Goals/Self-Care Activities Over the next 90 days, patient will:  - take medications as prescribed  Follow Up Plan: The patient has been provided with contact information for the care management team and has been advised to call with any health related questions or concerns.      Task: Mutually Develop and Malen Gauze Achievement of Patient Goals   Note:   Care Management Activities:    - verbalization of feelings encouraged    Notes:

## 2021-05-05 NOTE — Therapy (Signed)
[place] MAIN [facility] SERVICES [address] [place], [place], [address] Phone: [phone]   Fax:  [phone]  Physical Therapy Treatment  Patient Details  Name: Janet Rich MRN: [id number] Date of Birth: Jul 18, 1996 Referring Provider (PT): Hassan Buckler, CNM   Encounter Date: 05/05/2021   PT End of Session - 05/05/21 1449     Visit Number 6    Number of Visits 10    Date for PT Re-Evaluation 06/06/21    Authorization Type [employer]    Authorization Time Period 03/24/21-06/06/21    Authorization - Visit Number 6    Authorization - Number of Visits 10    PT Start Time 1400    PT Stop Time 8638   d/c   PT Time Calculation (min) 41 min    Activity Tolerance Patient tolerated treatment well;No increased pain    Behavior During Therapy WFL for tasks assessed/performed             Past Medical History:  Diagnosis Date   Anemia    with periods   Ankle fracture    right   Anxiety    Asthma    Back pain    Bipolar 1 disorder (HCC)    Depression    Bipolar    Endometriosis    GERD (gastroesophageal reflux disease)    Headache    MIGRAINES   IBS (irritable bowel syndrome)    Ovarian cyst    PCOS (polycystic ovarian syndrome)    Pneumonia    age 25   PONV (postoperative nausea and vomiting)    nausea after wisdom teeth extraction   Scoliosis    Vaginal Pap smear, abnormal    bx ok    Past Surgical History:  Procedure Laterality Date   FRACTURE SURGERY     ankle surgery with impant    HYSTEROSCOPY WITH D & C N/A 09/22/2019   Procedure: DILATATION AND CURETTAGE /HYSTEROSCOPY;  Surgeon: Ward, Honor Loh, MD;  Location: [facility];  Service: Gynecology;  Laterality: N/A;   ROBOTIC ASSISTED DIAGNOSTIC LAPAROSCOPY N/A 02/18/2017   Procedure: ROBOTIC ASSISTED DIAGNOSTIC LAPAROSCOPY,EXCISION AND ABLATION OF ENDOMETRIOSIS,LYSIS OF ADHESIONS;  Surgeon: Brien Few, MD;  Location: [facility];  Service: Gynecology;  Laterality: N/A;    VAGINAL DELIVERY N/A 08/14/2020   Procedure: VAGINAL DELIVERY;  Surgeon: Ouida Sills Gwen Her, MD;  Location: [facility] OR[facility];  Service: Obstetrics;  Laterality: N/A;   [place] EXTRACTION  2013    There were no vitals filed for this visit.   Subjective Assessment - 05/05/21 1401     Subjective Pt reported she is scheduled for a hysterectomy this Thursday, 05/08/21. Pt reported she had leakage with vomiting this morning, she's not sure why but feels like it is likely from anxiety about upcoming surgery. She did have leakage during vomiting. Pt states leakage is better after intercourse, about 25% better. Pelvic pain is about the same since the start of PT, but she has not performed HEP much the last week 2/2 migraine and washer broke (so she's handwashing diapers).    Pertinent History PCOS, endometriosis, Bipolar disorder, hx of preeclampsia (twin pregnancy), IBS, asthma, R trigeminal neuralgia (still occuring), HTN, vulvar atrophy, anxiety, hx of scoliosis (age [age]-did not use brace but has gone to chiropractor.    Patient Stated Goals "I'd like for sex to not hurt, for the speculum not to hurt, and to not leak all the time."    Currently in Pain? Yes  Pain Score 7     Pain Location Pelvis    Pain Orientation Right;Left    Pain Descriptors / Indicators Aching    Pain Type Chronic pain    Pain Radiating Towards B hips    Pain Onset More than a month ago    Pain Frequency Constant    Aggravating Factors  certain positions    Pain Relieving Factors relaxing                            NMR: Access Code: [email or web address] URL: [email or web address] Date: 04/14/2021 Prepared by: Geoffry Paradise   Exercises (REVIEWED and discussed most important to perform) Child's Pose with Sidebending - 1 x daily - 7 x weekly - 1 sets - 3 reps - 30-60 hold Sidelying Thoracic Rotation with Open Book - 1 x daily - 7 x weekly - 3 sets - 10 reps Latissimus Dorsi Stretch at Wall  - 1 x daily - 7 x weekly - 1 sets - 3 reps - 30 hold Supine Piriformis Stretch - 1 x daily - 7 x weekly - 1 sets - 3 reps - 30 hold Supine Pelvic Floor Contraction - 1 x daily - 7 x weekly - 3 sets - 10 reps Supine Diaphragmatic Breathing - 1 x daily - 7 x weekly - 1 sets - 5 reps     Patient Education (NEW) Healthy Posture: How to Hold and Lift a Tour manager and Lowering Baby to a Changing Table Squat Lift with a [employer] Activities Lifting Techniques Sleep Positions  PT reviewed all exercises and posture/lifting with pt and encouraged TrA activation during all txfs, bed mobility, and activity after surgery. PT modified each exercise based on upcoming surgery.       SELF CARE:  PT Education - 05/05/21 1447     Education Details PT reviewed LTGs with pt, deferred FOTO goal as we are d/c'ing early for pt's upcoming hysterectomy on 05/08/21. PT reviewed HEP to perform prior to surgery and when and how to perform post-op. PT discussed that we will d/c this POC and she will need a new referral post-op to focus on pelvic floor strengthening, relaxation, scar mobilization and balance. PT educated pt on toileting posture to relax pelvic floor during bowel and bladder movements and to fully empty bladder.    Person(s) Educated Patient    Methods Explanation;Demonstration;Verbal cues;Handout    Comprehension Returned demonstration;Verbalized understanding              PT Short Term Goals - 04/07/21 1623       PT SHORT TERM GOAL #1   Title Pt will be IND in all HEP in order to improve pain, flexibility and pain. TARGET DATE FOR ALL STGS: 3 TREATMENTS    Baseline No HEP    Status Partially Met      PT SHORT TERM GOAL #2   Title Pt will verbalize proper lifting and posture techniques in order to reduce pain and care for children.    Baseline Pt unable to verbalize proper lifting or posture techniques.    Status Partially Met      PT SHORT TERM GOAL #3    Title Have pt take FOTO survery and write goals as indicated.    Baseline 03/31/21: 71 PFDI pain, 52 urinary problem, 75 PFDI urinary    Status Achieved  PT Long Term Goals - 05/05/21 1410       PT LONG TERM GOAL #1   Title Pt will be IND in progressed HEP in order to improve strength, pain, continence and flexibility. TARGET DATE FOR ALL LTGS: AFTER 9 TREATMENTS    Baseline No HEP. 05/05/21: IND in initial HEP.    Status Partially Met      PT LONG TERM GOAL #2   Title Pt will report she is able to stand for 30 minutes without incr. in back/hip pain in order to cook at home.    Baseline 10-15 minutes prior to onset of pain; the same 05/05/21    Status Not Met      PT LONG TERM GOAL #3   Title Pt will change pants </=1 time per week 2/2 stress urinary incontinence.    Baseline 7 times a week, now 05/05/21: approx. 5 times/week    Status Partially Met      PT LONG TERM GOAL #4   Title Pt will report no pain during intercourse or speculum insertion during OBGYN exam.    Baseline 7/10 pain during/after intercourse and speculum insertion; 05/05/21: 7/10 the same    Status Not Met      PT LONG TERM GOAL #5   Title Pt will improve PFDI pain score to </=48 to improve pain and QOL.    Baseline 71    Status Deferred                   Plan - 05/05/21 1450     Clinical Impression Statement Pt partially met LTGs 1 and 3. LTG 5 deferred. Pt did not meet LTGs 2 and 4, as she is discharging early 2/2 hysterectomy scheduled on 05/08/21. Pt will need new referral for OP pelvic health PT post-op (at least 6 weeks post-op). Please see d/c summary for details.    Comorbidities PCOS, endometriosis, Bipolar disorder, hx of preeclampsia (twin pregnancy), IBS, asthma, R trigeminal neuralgia (still occuring), HTN, vulvar atrophy, anxiety, hx of scoliosis (age [age]-did not use brace but has gone to chiropractor.    PT Frequency 1x / week    PT Duration Other (comment)   10 weeks    PT Treatment/Interventions ADLs/Self Care Home Management;Biofeedback;Neuromuscular re-education;Balance training;Therapeutic exercise;Therapeutic activities;Functional mobility training;Stair training;Gait training;Patient/family education;Manual techniques;Joint Manipulations;Spinal Manipulations;Dry needling    PT Next Visit Plan review HEP and progress as tolerated. Manual therapy of QL, LD, hip musculature prn. Internal exam. Trigger point release of psoas, assess mobility in prone and treat as indicated.    PT Home Exercise Plan Medbridge:[email or web address]             Patient will benefit from skilled therapeutic intervention in order to improve the following deficits and impairments:  Abnormal gait, Decreased coordination, Decreased activity tolerance, Decreased balance, Decreased mobility, Decreased strength, Postural dysfunction, Impaired flexibility, Improper body mechanics, Pain  Visit Diagnosis: Stress incontinence (female) (female)  Other lack of coordination  Dyspareunia due to medical condition in female  Sacrococcygeal disorders, not elsewhere classified     Problem List Patient Active Problem List   Diagnosis Date Noted   Myalgia 03/11/2021   Elevated BP without diagnosis of hypertension 01/08/2021   Dichorionic diamniotic twin pregnancy in third trimester 08/14/2020   Preeclampsia 08/14/2020   Twin pregnancy delivered vaginally 08/14/2020   Preterm labor 06/17/2020   Right trigeminal neuralgia 11/08/2019   Endometriosis 03/27/2019   PCOS (polycystic ovarian syndrome) 03/27/2019   Moderate persistent asthma 03/07/2019  Post-nasal drainage 03/07/2019   Irritable bowel syndrome with diarrhea 03/07/2019   Gastroesophageal reflux disease without esophagitis 03/07/2019   Class 2 obesity due to excess calories without serious comorbidity with body mass index (BMI) of 38.0 to 38.9 in adult 09/02/2018   Bipolar 2 disorder ([place]) 08/12/2018   History of low birth weight  03/11/2018   Vulvar atrophy 03/11/2018   History of migraine headaches 05/01/2015   Anxiety and depression 05/01/2015    Norvell Caswell L 05/05/2021, 2:52 PM  [place] MAIN [facility] SERVICES [address] [place], [place], [address] Phone: [phone]   Fax:  [phone]  Name: Janet Rich MRN: [id number] Date of Birth: Oct 03, 1996  PHYSICAL THERAPY DISCHARGE SUMMARY  Visits from Start of Care: 6  Current functional level related to goals / functional outcomes:  PT Long Term Goals - 05/05/21 1410       PT LONG TERM GOAL #1   Title Pt will be IND in progressed HEP in order to improve strength, pain, continence and flexibility. TARGET DATE FOR ALL LTGS: AFTER 9 TREATMENTS    Baseline No HEP. 05/05/21: IND in initial HEP.    Status Partially Met      PT LONG TERM GOAL #2   Title Pt will report she is able to stand for 30 minutes without incr. in back/hip pain in order to cook at home.    Baseline 10-15 minutes prior to onset of pain; the same 05/05/21    Status Not Met      PT LONG TERM GOAL #3   Title Pt will change pants </=1 time per week 2/2 stress urinary incontinence.    Baseline 7 times a week, now 05/05/21: approx. 5 times/week    Status Partially Met      PT LONG TERM GOAL #4   Title Pt will report no pain during intercourse or speculum insertion during OBGYN exam.    Baseline 7/10 pain during/after intercourse and speculum insertion; 05/05/21: 7/10 the same    Status Not Met      PT LONG TERM GOAL #5   Title Pt will improve PFDI pain score to </=48 to improve pain and QOL.    Baseline 71    Status Deferred               Remaining deficits: Incontinence, weakness, hypomobility   Education / Equipment: See above.    Patient agrees to discharge. Patient goals were partially met. Patient is being discharged due to a change in medical status. 2/2 pt being scheduled for hysterectomy on 05/08/21 2/2 pain.   Geoffry Paradise, PT,DPT 05/05/21 2:53 PM Phone: [phone] Fax: [phone]

## 2021-05-05 NOTE — Patient Instructions (Addendum)
Toileting posture:  1) Peeing posture: feet flat, lean forward and rest forearms on legs to relax and empty bladder  2) Bowel movements: feet flat on squatty potty (or elevated surface so knees are higher than hips), lean forward and rest forearms on legs.

## 2021-05-05 NOTE — Patient Outreach (Signed)
Medicaid Managed Care    Pharmacy Note  05/05/2021 Name: Janet Rich MRN: [id number] DOB: 1996-08-31  Janet Rich is a 25 y.o. year old female who is a primary care patient of [facility], [place]. The [facility] Managed Care Coordination team was consulted for assistance with disease management and care coordination needs.    Engaged with patient Engaged with patient by telephone for initial visit in response to referral for case management and/or care coordination services.  [name] was given information about Managed Medicaid Care Coordination team services today. [place] Plan agreed to services and verbal consent obtained.   Objective:  Lab Results  Component Value Date   CREATININE 0.48 05/05/2021   CREATININE 0.49 (L) 03/11/2021   CREATININE 0.67 01/08/2021    No results found for: HGBA1C     Component Value Date/Time   CHOL 213 (H) 12/14/2018 0000   TRIG 124 12/14/2018 0000   HDL 72 12/14/2018 0000   CHOLHDL 3.0 12/14/2018 0000   LDLCALC 118 (H) 12/14/2018 0000    Other: (TSH, CBC, Vit D, etc.)  Clinical ASCVD: No  The ASCVD Risk score Denman George DC Jr., et al., 2013) failed to calculate for the following reasons:   The 2013 ASCVD risk score is only valid for ages 73 to 30    Other: (CHADS2VASc if Afib, PHQ9 if depression, MMRC or CAT for COPD, ACT, DEXA)  BP Readings from Last 3 Encounters:  04/15/21 122/86  03/11/21 124/82  02/10/21 (!) 130/98    Assessment/Interventions: Review of patient past medical history, allergies, medications, health status, including review of consultants reports, laboratory and other test data, was performed as part of comprehensive evaluation and provision of chronic care management services.   Anxiety GAD 7 : Generalized Anxiety Score 03/11/2021 01/07/2021 12/14/2018 08/10/2018  Nervous, Anxious, on Edge 3 1 0 3  Control/stop worrying 2 1 0 3  Worry too much - different things 2 1 0 3  Trouble relaxing 3 0 0 2   Restless 1 0 0 1  Easily annoyed or irritable 2 0 0 2  Afraid - awful might happen 2 1 0 3  Total GAD 7 Score 15 4 0 17  Anxiety Difficulty Very difficult Not difficult at all Not difficult at all -   Alprazolam 0.25mg  Plan: Will defer to specialist regarding management   Asthma Symbicort July 2022: Unable to get this medication, called [facility] and had [facility]. NO PA NEEDED  BiPolar Latuda 20mg  QD Lamotrigine 25mg  QD July 2022: Patient hasn't taken Lamotrigine 25mg  because she's unable to cut her current dose in 1/4ths. Asked Dr. August 2022 to send in 25mg  dose  Migraine -Has had a daily migraine for weeks Sumatriptan 25mg  PRN Propranolol 10mg  (Hasn't been taking but will start) July 2022: Explained what Propranolol is and how it works in the body, patient hasn't taken in months. Also explained role of triptan in disease management  Meds: -Patient changed insurance and no longer uses [facility], [facility] but some meds are [facility] different [facility]aram[facility].  July 2022: Asked Dr. Angelica Chessman to send Lamotrigine 25mg  and [facility]. [facility] to get S[facility]icort [facility] and Propranolol refilled  SDOH (Social Determinants of Health) assessments and interventions performed:    Care Plan  Allergies  Allergen Reactions   Amoxicillin Shortness Of Breath and Nausea Only    Dizziness   Other     Must have anti-emetic prior to being given any narcotics    Latex Rash  Medications Reviewed Today     Reviewed by Zettie Pho, [facility] (Pharmacist) on 05/05/21 at 1313  Med List Status: <None>   Medication Order Taking? Sig Documenting Provider Last Dose Status Informant  acetaminophen (TYLENOL) 500 MG tablet [id number]  Take 2 tablets (1,000 mg total) by mouth every 6 (six) hours as needed for moderate pain. McVey, Prudencio Pair, CNM  Active Self           Med Note Alphonzo Dublin   Fri Apr 25, 2021  3:42 PM)    albuterol (PROVENTIL HFA;VENTOLIN HFA) 108 (90 Base) MCG/ACT inhaler [id number]   Inhale 1-2 puffs into the lungs every 6 (six) hours as needed for wheezing or shortness of breath. Domenick Gong, MD  Active Self           Med Note Alphonzo Dublin   Fri Apr 25, 2021  3:43 PM)    ALPRAZolam Prudy Feeler) 0.25 MG tablet [id number]  Take 0.25 mg by mouth at bedtime as needed for anxiety. [provider]  Active   budesonide-formoterol (SYMBICORT) 80-4.5 MCG/ACT inhaler [id number]  Inhale 2 puffs into the lungs 2 (two) times daily.  Patient not taking: No sig reported   Gabriel Cirri, NP  Active Self           Med Note Gust Brooms May 05, 2021 12:49 PM) Can't afford  fluconazole (DIFLUCAN) 150 MG tablet [id number] No Take 1 tab PO today and repeat in 72 hours if needed  Patient not taking: No sig reported   Gareth Morgan Not Taking Consider Medication Status and Discontinue Self  ibuprofen (ADVIL) 200 MG tablet [id number] No Take 400 mg by mouth every 6 (six) hours as needed for moderate pain.  Patient not taking: Reported on 05/05/2021   [provider] Not Taking Consider Medication Status and Discontinue Self  ibuprofen (ADVIL) 600 MG tablet [id number] Yes Take 1 tablet (600 mg total) by mouth every 6 (six) hours as needed. Gustavo Lah, CNM Taking Active Self           Med Note Alphonzo Dublin   Fri Apr 25, 2021  3:42 PM)    lamoTRIgine (LAMICTAL) 25 MG tablet [id number] Yes Take 25 mg by mouth daily. [provider] Taking Active Self  lurasidone (LATUDA) 20 MG TABS tablet [id number] Yes Take 1 tablet (20 mg total) by mouth daily. Gabriel Cirri, NP Taking Active Self  miconazole (MICOTIN) 2 % cream [id number] No Apply 1 application topically 2 (two) times daily.  Patient not taking: No sig reported   Gareth Morgan Not Taking Consider Medication Status and Discontinue Self  propranolol (INDERAL) 10 MG tablet [id number]  Take 1 tablet (10 mg total) by mouth 2 (two) times daily.  Patient not taking: No sig reported   Danelle Berry, PA-C  Active Self  SUMAtriptan (IMITREX) 25 MG tablet [id number]  Take 25-50 mg by mouth once at the onset of headache, may repeat dose 1-2 hours if still symptomatic  Max dose in 24 hours is 200 mg  Patient taking differently: Take 25 mg by mouth every 2 (two) hours as needed for migraine or headache.   Danelle Berry, PA-C  Active             Patient Active Problem List   Diagnosis Date Noted   Myalgia 03/11/2021   Elevated BP without diagnosis of hypertension 01/08/2021   Dichorionic diamniotic twin pregnancy in third trimester 08/14/2020  Preeclampsia 08/14/2020   Twin pregnancy delivered vaginally 08/14/2020   Preterm labor 06/17/2020   Right trigeminal neuralgia 11/08/2019   Endometriosis 03/27/2019   PCOS (polycystic ovarian syndrome) 03/27/2019   Moderate persistent asthma 03/07/2019   Post-nasal drainage 03/07/2019   Irritable bowel syndrome with diarrhea 03/07/2019   Gastroesophageal reflux disease without esophagitis 03/07/2019   Class 2 obesity due to excess calories without serious comorbidity with body mass index (BMI) of 38.0 to 38.9 in adult 09/02/2018   Bipolar 2 disorder (HCC) 08/12/2018   History of low birth weight 03/11/2018   Vulvar atrophy 03/11/2018   History of migraine headaches 05/01/2015   Anxiety and depression 05/01/2015    Conditions to be addressed/monitored: Anxiety, Depression, Bipolar Disorder, and Pulmonary Disease  Care Plan : Medication Managament  Updates made by Zettie Pho, RPH since 05/05/2021 12:00 AM     Problem: Health Promotion or Disease Self-Management (General Plan of Care)      Goal: Medication Management   Note:   Current Barriers:  Does not adhere to prescribed medication regimen   Pharmacist Clinical Goal(s):  Over the next 90 days, patient will adhere to prescribed medication regimen as evidenced by conversation today  through collaboration with PharmD and provider.     Interventions: Inter-disciplinary care team collaboration (see longitudinal plan of care) Comprehensive medication review performed; medication list updated in electronic medical record     Patient Goals/Self-Care Activities Over the next 90 days, patient will:  - take medications as prescribed  Follow Up Plan: The patient has been provided with contact information for the care management team and has been advised to call with any health related questions or concerns.      Task: Mutually Develop and Malen Gauze Achievement of Patient Goals   Note:   Care Management Activities:    - verbalization of feelings encouraged    Notes:        Medication Assistance: Application for Symbicort medication assistance program in process. Anticipated assistance start date 05/05/21. See plan of care above for additional detail.   Follow up: Agree   Plan: The patient has been provided with contact information for the care management team and has been advised to call with any health related questions or concerns.   Artelia Laroche, Pharm.D., Managed Medicaid Pharmacist - [phone]

## 2021-05-07 ENCOUNTER — Ambulatory Visit: Payer: Medicaid Other | Admitting: Licensed Clinical Social Worker

## 2021-05-07 ENCOUNTER — Telehealth: Payer: Self-pay | Admitting: Family Medicine

## 2021-05-07 ENCOUNTER — Other Ambulatory Visit: Payer: Self-pay | Admitting: Family Medicine

## 2021-05-07 DIAGNOSIS — F411 Generalized anxiety disorder: Secondary | ICD-10-CM

## 2021-05-07 DIAGNOSIS — G43909 Migraine, unspecified, not intractable, without status migrainosus: Secondary | ICD-10-CM

## 2021-05-07 DIAGNOSIS — F3181 Bipolar II disorder: Secondary | ICD-10-CM

## 2021-05-07 MED ORDER — APREPITANT 40 MG PO CAPS: 40.0000 mg | ORAL_CAPSULE | Freq: Once | ORAL | Status: AC

## 2021-05-07 MED ORDER — CEFAZOLIN SODIUM-DEXTROSE 2-4 GM/100ML-% IV SOLN
2.0000 g | INTRAVENOUS | Status: AC
  Administered 2021-05-08: 2 g via INTRAVENOUS

## 2021-05-07 MED ORDER — POVIDONE-IODINE 10 % EX SWAB
2.0000 "application " | Freq: Once | CUTANEOUS | Status: AC
  Administered 2021-05-08: 2 via TOPICAL

## 2021-05-07 MED ORDER — GABAPENTIN 300 MG PO CAPS: 300.0000 mg | ORAL_CAPSULE | ORAL | Status: AC

## 2021-05-07 MED ORDER — ACETAMINOPHEN 500 MG PO TABS: 1000.0000 mg | ORAL_TABLET | ORAL | Status: AC

## 2021-05-07 MED ORDER — FAMOTIDINE 20 MG PO TABS: 20.0000 mg | ORAL_TABLET | Freq: Once | ORAL | Status: AC

## 2021-05-07 NOTE — Progress Notes (Signed)
Counselor/Therapist Progress Note  [name], MRN: [id number],    Date: 05/07/2021  Time Spent: 38 minutes    Treatment Type: Psychotherapy  Reported Symptoms:  overall stable mood   Mental Status Exam:  Appearance:   Casual     Behavior:  Appropriate and Sharing  Motor:  Normal  Speech/Language:   Clear and Coherent and Normal Rate  Affect:  Appropriate, Congruent, and Full Range  Mood:  normal  Thought process:  normal  Thought content:    WNL  Sensory/Perceptual disturbances:    WNL  Orientation:  oriented to person, place, time/date, and situation  Attention:  Good  Concentration:  Good  Memory:  WNL  Fund of knowledge:   Good  Insight:    Good  Judgment:   Good  Impulse Control:  Good   Risk Assessment: Danger to Self:  No Self-injurious Behavior: No Danger to Others: No Duty to Warn:no Physical Aggression / Violence:No  Access to Firearms a concern: No  Gang Involvement:No   Subjective: Patient was engaged and cooperative throughout the session using time effectively to discuss thoughts and feelings. Patient voices continued motivation for treatment and understanding of mood and anxiety issues. Patient is likely to benefit from future treatment because she remains motivated to manage symptoms and improve functioning  and reports benefit of regular sessions in addressing these symptoms.   Interventions: Cognitive Behavioral Therapy Established psychological safety. Checked in with patient regarding her week. Checked in with patient regarding topic of previous session - decreased emotions and increased acceptance related to upcoming hysterectomy. Engaged patient in processing current psychosocial stressors, anxiety due to upcoming surgery and recovery period. Explored patient's perceptions of what to expect, highlighting unhelpful thought patterns, challenging these thoughts and reframing thoughts leading to increased anxiety. Encouraged patient to  execute her planned activities during her recovery. Provided support through active listening, validation of feelings, and highlighted patient's strengths.   Diagnosis:   ICD-10-CM   1. Generalized anxiety disorder  F41.1     2. Bipolar 2 disorder (HCC)  F31.81      Plan: Patient's goal is: Help with anxiety and working through things. She reports benefit from having someone to listen to her.   Treatment Target: Increase realistic balanced thinking Explore patient's thoughts, beliefs, automatic thoughts, assumptions Identify unhelpful thinking patterns Process distress and allow for emotional release Questioning and challenging thoughts Cognitive reappraisal   Treatment Target: Increase coping skills Mindfulness practices Deep breathing STOP technique Grounding exercises Treatment Target: Reducing vulnerability to "emotional mind" Values clarification   Self-care - nutrition, sleep, exercise Increase living within values  Teach distress tolerance techniques - "what helps me"   Future Appointments  Date Time Provider Department Center  05/12/2021  2:00 PM Zerita Boers L, PT [facility] None  05/15/2021  2:00 PM Kathreen Cosier, LCSW [facility] None  05/19/2021  2:00 PM Sherren Kerns, PT [facility] None  05/26/2021  2:00 PM Sherren Kerns, PT [facility] None  05/27/2021  9:00 AM Danelle Berry, PA-C [facility]  06/02/2021  2:00 PM Sherren Kerns, PT [facility] None  06/09/2021  2:00 PM Sherren Kerns, PT [facility] None  06/16/2021  1:00 PM [facility] PHARMACIST [facility] None    Kathreen Cosier, LCSW

## 2021-05-07 NOTE — Telephone Encounter (Signed)
-----   Message from Zettie Pho, [facility] sent at 05/05/2021  1:07 PM EDT ----- Regarding: New Pharmacy Afternoon! This woman changed insurance and is no longer [facility].  Could you please send in new scripts of her  Sum[facility]riptan, [facility]rigine [facility]?  I called [facility] and got the rest transferred over but there's no refills left on those two. Thank you so much!!

## 2021-05-08 ENCOUNTER — Encounter: Payer: Self-pay | Admitting: Obstetrics and Gynecology

## 2021-05-08 ENCOUNTER — Other Ambulatory Visit: Payer: Self-pay

## 2021-05-08 ENCOUNTER — Observation Stay: Payer: Medicaid Other | Admitting: Certified Registered"

## 2021-05-08 ENCOUNTER — Other Ambulatory Visit: Payer: Self-pay | Admitting: Obstetrics and Gynecology

## 2021-05-08 ENCOUNTER — Observation Stay
Admission: RE | Admit: 2021-05-08 | Discharge: 2021-05-09 | Disposition: A | Discharge status: Home / Self Care | Payer: Medicaid Other | Attending: Obstetrics and Gynecology | Admitting: Obstetrics and Gynecology

## 2021-05-08 ENCOUNTER — Encounter
Admission: RE | Disposition: A | Discharge status: Home / Self Care | Payer: Self-pay | Source: Home / Self Care | Attending: Obstetrics and Gynecology

## 2021-05-08 DIAGNOSIS — R102 Pelvic and perineal pain: Secondary | ICD-10-CM | POA: Diagnosis not present

## 2021-05-08 DIAGNOSIS — N809 Endometriosis, unspecified: Secondary | ICD-10-CM | POA: Diagnosis present

## 2021-05-08 DIAGNOSIS — J45909 Unspecified asthma, uncomplicated: Secondary | ICD-10-CM | POA: Insufficient documentation

## 2021-05-08 DIAGNOSIS — N801 Endometriosis of ovary: Secondary | ICD-10-CM | POA: Diagnosis not present

## 2021-05-08 DIAGNOSIS — K660 Peritoneal adhesions (postprocedural) (postinfection): Secondary | ICD-10-CM | POA: Insufficient documentation

## 2021-05-08 DIAGNOSIS — Z79899 Other long term (current) drug therapy: Secondary | ICD-10-CM | POA: Insufficient documentation

## 2021-05-08 DIAGNOSIS — N800 Endometriosis of uterus: Principal | ICD-10-CM | POA: Insufficient documentation

## 2021-05-08 DIAGNOSIS — Z7982 Long term (current) use of aspirin: Secondary | ICD-10-CM | POA: Insufficient documentation

## 2021-05-08 DIAGNOSIS — N393 Stress incontinence (female) (male): Secondary | ICD-10-CM | POA: Diagnosis not present

## 2021-05-08 DIAGNOSIS — K219 Gastro-esophageal reflux disease without esophagitis: Secondary | ICD-10-CM | POA: Diagnosis not present

## 2021-05-08 HISTORY — PX: LYSIS OF ADHESION: SHX5961

## 2021-05-08 HISTORY — PX: ROBOTIC ASSISTED LAPAROSCOPIC HYSTERECTOMY AND SALPINGECTOMY: SHX6379

## 2021-05-08 LAB — POCT PREGNANCY, URINE: Preg Test, Ur: NEGATIVE

## 2021-05-08 SURGERY — XI ROBOTIC ASSISTED LAPAROSCOPIC HYSTERECTOMY AND SALPINGECTOMY
Anesthesia: General

## 2021-05-08 MED ORDER — MENTHOL 3 MG MT LOZG
1.0000 | LOZENGE | OROMUCOSAL | Status: DC | PRN
  Filled 2021-05-08: qty 9

## 2021-05-08 MED ORDER — BUPIVACAINE HCL (PF) 0.5 % IJ SOLN
INTRAMUSCULAR | Status: DC | PRN
  Administered 2021-05-08: 15 mL

## 2021-05-08 MED ORDER — KETAMINE HCL 50 MG/ML IJ SOLN
INTRAMUSCULAR | Status: AC
  Filled 2021-05-08: qty 10

## 2021-05-08 MED ORDER — LAMOTRIGINE 25 MG PO TABS
25.0000 mg | ORAL_TABLET | Freq: Every day | ORAL | Status: DC
  Filled 2021-05-08 (×2): qty 1

## 2021-05-08 MED ORDER — LURASIDONE HCL 40 MG PO TABS
20.0000 mg | ORAL_TABLET | Freq: Every day | ORAL | Status: DC
  Filled 2021-05-08 (×2): qty 1

## 2021-05-08 MED ORDER — SIMETHICONE 80 MG PO CHEW: 80.0000 mg | CHEWABLE_TABLET | Freq: Four times a day (QID) | ORAL | Status: DC | PRN

## 2021-05-08 MED ORDER — FENTANYL CITRATE (PF) 100 MCG/2ML IJ SOLN
INTRAMUSCULAR | Status: DC | PRN
  Administered 2021-05-08 (×3): 50 ug via INTRAVENOUS

## 2021-05-08 MED ORDER — FAMOTIDINE 20 MG PO TABS
ORAL_TABLET | ORAL | Status: AC
  Administered 2021-05-08: 20 mg via ORAL
  Filled 2021-05-08: qty 1

## 2021-05-08 MED ORDER — SCOPOLAMINE 1 MG/3DAYS TD PT72
1.0000 | MEDICATED_PATCH | TRANSDERMAL | Status: DC
  Administered 2021-05-08: 1.5 mg via TRANSDERMAL
  Filled 2021-05-08: qty 1

## 2021-05-08 MED ORDER — ORAL CARE MOUTH RINSE: 15.0000 mL | Freq: Once | OROMUCOSAL | Status: AC

## 2021-05-08 MED ORDER — BUPIVACAINE HCL (PF) 0.5 % IJ SOLN
INTRAMUSCULAR | Status: AC
  Filled 2021-05-08: qty 30

## 2021-05-08 MED ORDER — ONDANSETRON HCL 4 MG/2ML IJ SOLN
4.0000 mg | Freq: Four times a day (QID) | INTRAMUSCULAR | Status: DC | PRN
  Administered 2021-05-08 – 2021-05-09 (×3): 4 mg via INTRAVENOUS
  Filled 2021-05-08 (×3): qty 2

## 2021-05-08 MED ORDER — KETOROLAC TROMETHAMINE 30 MG/ML IJ SOLN
INTRAMUSCULAR | Status: DC | PRN
  Administered 2021-05-08: 30 mg via INTRAVENOUS

## 2021-05-08 MED ORDER — PROPOFOL 500 MG/50ML IV EMUL
INTRAVENOUS | Status: AC
  Filled 2021-05-08: qty 50

## 2021-05-08 MED ORDER — MIDAZOLAM HCL 2 MG/2ML IJ SOLN
INTRAMUSCULAR | Status: DC | PRN
  Administered 2021-05-08: 2 mg via INTRAVENOUS

## 2021-05-08 MED ORDER — LACTATED RINGERS IV SOLN: INTRAVENOUS | Status: DC

## 2021-05-08 MED ORDER — PROPOFOL 10 MG/ML IV BOLUS
INTRAVENOUS | Status: AC
  Filled 2021-05-08: qty 20

## 2021-05-08 MED ORDER — MOMETASONE FURO-FORMOTEROL FUM 100-5 MCG/ACT IN AERO
2.0000 | INHALATION_SPRAY | Freq: Two times a day (BID) | RESPIRATORY_TRACT | Status: DC
  Administered 2021-05-09: 2 via RESPIRATORY_TRACT
  Filled 2021-05-08: qty 8.8

## 2021-05-08 MED ORDER — PROPOFOL 10 MG/ML IV BOLUS
INTRAVENOUS | Status: DC | PRN
  Administered 2021-05-08: 180 mg via INTRAVENOUS

## 2021-05-08 MED ORDER — FENTANYL CITRATE (PF) 100 MCG/2ML IJ SOLN
INTRAMUSCULAR | Status: AC
  Filled 2021-05-08: qty 2

## 2021-05-08 MED ORDER — ACETAMINOPHEN 500 MG PO TABS
ORAL_TABLET | ORAL | Status: AC
  Administered 2021-05-08: 1000 mg via ORAL
  Filled 2021-05-08: qty 2

## 2021-05-08 MED ORDER — OXYCODONE HCL 5 MG/5ML PO SOLN: 5.0000 mg | Freq: Once | ORAL | Status: DC | PRN

## 2021-05-08 MED ORDER — SUMATRIPTAN SUCCINATE 25 MG PO TABS: ORAL_TABLET | ORAL | 0 refills | Status: DC

## 2021-05-08 MED ORDER — ALUM & MAG HYDROXIDE-SIMETH 200-200-20 MG/5ML PO SUSP: 30.0000 mL | ORAL | Status: DC | PRN

## 2021-05-08 MED ORDER — TRAMADOL HCL 50 MG PO TABS: 50.0000 mg | ORAL_TABLET | Freq: Once | ORAL | Status: DC | PRN

## 2021-05-08 MED ORDER — CEFAZOLIN SODIUM-DEXTROSE 2-4 GM/100ML-% IV SOLN
INTRAVENOUS | Status: AC
  Filled 2021-05-08: qty 100

## 2021-05-08 MED ORDER — GABAPENTIN 300 MG PO CAPS
ORAL_CAPSULE | ORAL | Status: AC
  Administered 2021-05-08: 300 mg via ORAL
  Filled 2021-05-08: qty 1

## 2021-05-08 MED ORDER — ROCURONIUM BROMIDE 100 MG/10ML IV SOLN
INTRAVENOUS | Status: DC | PRN
  Administered 2021-05-08: 50 mg via INTRAVENOUS
  Administered 2021-05-08: 30 mg via INTRAVENOUS
  Administered 2021-05-08: 10 mg via INTRAVENOUS

## 2021-05-08 MED ORDER — DEXMEDETOMIDINE HCL IN NACL 200 MCG/50ML IV SOLN
INTRAVENOUS | Status: DC | PRN
  Administered 2021-05-08: .2 ug/kg/h via INTRAVENOUS

## 2021-05-08 MED ORDER — KETAMINE HCL 10 MG/ML IJ SOLN
INTRAMUSCULAR | Status: DC | PRN
  Administered 2021-05-08: 10 mg via INTRAVENOUS
  Administered 2021-05-08 (×2): 25 mg via INTRAVENOUS

## 2021-05-08 MED ORDER — CHLORHEXIDINE GLUCONATE 0.12 % MT SOLN: 15.0000 mL | Freq: Once | OROMUCOSAL | Status: AC

## 2021-05-08 MED ORDER — FENTANYL CITRATE (PF) 100 MCG/2ML IJ SOLN
25.0000 ug | INTRAMUSCULAR | Status: DC | PRN
  Administered 2021-05-08 (×2): 50 ug via INTRAVENOUS

## 2021-05-08 MED ORDER — KETOROLAC TROMETHAMINE 30 MG/ML IJ SOLN
30.0000 mg | Freq: Four times a day (QID) | INTRAMUSCULAR | Status: AC
  Administered 2021-05-08 – 2021-05-09 (×3): 30 mg via INTRAVENOUS
  Filled 2021-05-08 (×3): qty 1

## 2021-05-08 MED ORDER — DEXAMETHASONE SODIUM PHOSPHATE 10 MG/ML IJ SOLN
INTRAMUSCULAR | Status: DC | PRN
  Administered 2021-05-08: 10 mg via INTRAVENOUS

## 2021-05-08 MED ORDER — HYDROMORPHONE HCL 1 MG/ML IJ SOLN
0.2000 mg | INTRAMUSCULAR | Status: DC | PRN
  Administered 2021-05-08: 0.4 mg via INTRAVENOUS
  Filled 2021-05-08: qty 1

## 2021-05-08 MED ORDER — PROMETHAZINE HCL 25 MG/ML IJ SOLN
6.2500 mg | INTRAMUSCULAR | Status: DC | PRN
Start: 2021-05-08 — End: 2021-05-08

## 2021-05-08 MED ORDER — KETOROLAC TROMETHAMINE 30 MG/ML IJ SOLN: 30.0000 mg | Freq: Once | INTRAMUSCULAR | Status: DC

## 2021-05-08 MED ORDER — ALBUTEROL SULFATE (2.5 MG/3ML) 0.083% IN NEBU: 2.5000 mg | INHALATION_SOLUTION | Freq: Four times a day (QID) | RESPIRATORY_TRACT | Status: DC | PRN

## 2021-05-08 MED ORDER — LIDOCAINE HCL (CARDIAC) PF 100 MG/5ML IV SOSY
PREFILLED_SYRINGE | INTRAVENOUS | Status: DC | PRN
  Administered 2021-05-08: 50 mg via INTRAVENOUS

## 2021-05-08 MED ORDER — PROPOFOL 500 MG/50ML IV EMUL
INTRAVENOUS | Status: DC | PRN
  Administered 2021-05-08: 150 ug/kg/min via INTRAVENOUS

## 2021-05-08 MED ORDER — ONDANSETRON HCL 4 MG/2ML IJ SOLN
INTRAMUSCULAR | Status: DC | PRN
  Administered 2021-05-08: 4 mg via INTRAVENOUS

## 2021-05-08 MED ORDER — CHLORHEXIDINE GLUCONATE 0.12 % MT SOLN
OROMUCOSAL | Status: AC
  Administered 2021-05-08: 15 mL via OROMUCOSAL
  Filled 2021-05-08: qty 15

## 2021-05-08 MED ORDER — DIPHENHYDRAMINE HCL 50 MG/ML IJ SOLN
INTRAMUSCULAR | Status: DC | PRN
  Administered 2021-05-08: 12.5 mg via INTRAVENOUS

## 2021-05-08 MED ORDER — FENTANYL CITRATE (PF) 100 MCG/2ML IJ SOLN
INTRAMUSCULAR | Status: AC
  Administered 2021-05-08: 50 ug via INTRAVENOUS
  Filled 2021-05-08: qty 2

## 2021-05-08 MED ORDER — PROMETHAZINE HCL 25 MG RE SUPP
12.5000 mg | Freq: Four times a day (QID) | RECTAL | Status: DC | PRN
  Administered 2021-05-08: 12.5 mg via RECTAL
  Filled 2021-05-08: qty 1

## 2021-05-08 MED ORDER — SUGAMMADEX SODIUM 200 MG/2ML IV SOLN
INTRAVENOUS | Status: DC | PRN
  Administered 2021-05-08: 200 mg via INTRAVENOUS

## 2021-05-08 MED ORDER — KETAMINE HCL 50 MG/5ML IJ SOSY
PREFILLED_SYRINGE | INTRAMUSCULAR | Status: AC
  Filled 2021-05-08: qty 5

## 2021-05-08 MED ORDER — ALBUTEROL SULFATE HFA 108 (90 BASE) MCG/ACT IN AERS: 1.0000 | INHALATION_SPRAY | Freq: Four times a day (QID) | RESPIRATORY_TRACT | Status: DC | PRN

## 2021-05-08 MED ORDER — MIDAZOLAM HCL 2 MG/2ML IJ SOLN
INTRAMUSCULAR | Status: AC
  Filled 2021-05-08: qty 2

## 2021-05-08 MED ORDER — IBUPROFEN 600 MG PO TABS
600.0000 mg | ORAL_TABLET | Freq: Four times a day (QID) | ORAL | Status: DC
  Administered 2021-05-09: 600 mg via ORAL
  Filled 2021-05-08: qty 1

## 2021-05-08 MED ORDER — OXYCODONE HCL 5 MG PO TABS: 5.0000 mg | ORAL_TABLET | Freq: Once | ORAL | Status: DC | PRN

## 2021-05-08 MED ORDER — OXYCODONE HCL 5 MG PO TABS
5.0000 mg | ORAL_TABLET | ORAL | Status: DC | PRN
  Administered 2021-05-08 – 2021-05-09 (×2): 10 mg via ORAL
  Filled 2021-05-08 (×2): qty 2

## 2021-05-08 MED ORDER — ONDANSETRON HCL 4 MG PO TABS: 4.0000 mg | ORAL_TABLET | Freq: Four times a day (QID) | ORAL | Status: DC | PRN

## 2021-05-08 MED ORDER — GABAPENTIN 300 MG PO CAPS
300.0000 mg | ORAL_CAPSULE | Freq: Every day | ORAL | Status: DC
  Filled 2021-05-08: qty 1

## 2021-05-08 MED ORDER — DOCUSATE SODIUM 100 MG PO CAPS
100.0000 mg | ORAL_CAPSULE | Freq: Two times a day (BID) | ORAL | Status: DC
  Administered 2021-05-08 – 2021-05-09 (×2): 100 mg via ORAL
  Filled 2021-05-08 (×2): qty 1

## 2021-05-08 MED ORDER — LACTATED RINGERS IV SOLN: INTRAVENOUS | Status: DC | PRN

## 2021-05-08 MED ORDER — ALPRAZOLAM 0.5 MG PO TABS: 0.2500 mg | ORAL_TABLET | Freq: Every evening | ORAL | Status: DC | PRN

## 2021-05-08 MED ORDER — APREPITANT 40 MG PO CAPS
ORAL_CAPSULE | ORAL | Status: AC
  Administered 2021-05-08: 40 mg via ORAL
  Filled 2021-05-08: qty 1

## 2021-05-08 SURGICAL SUPPLY — 61 items
BACTOSHIELD CHG 4% 4OZ (MISCELLANEOUS) ×1
BAG URINE DRAIN 2000ML AR STRL (UROLOGICAL SUPPLIES) ×3 IMPLANT
BLADE SURG SZ11 CARB STEEL (BLADE) ×3 IMPLANT
CANISTER SUCT 1200ML W/VALVE (MISCELLANEOUS) ×3 IMPLANT
CANNULA CAP OBTURATR AIRSEAL 8 (CAP) ×3 IMPLANT
CATH FOLEY 2WAY  5CC 16FR (CATHETERS) ×1
CATH URTH 16FR FL 2W BLN LF (CATHETERS) ×2 IMPLANT
CHLORAPREP W/TINT 26 (MISCELLANEOUS) ×3 IMPLANT
COVER TIP SHEARS 8 DVNC (MISCELLANEOUS) ×2 IMPLANT
COVER TIP SHEARS 8MM DA VINCI (MISCELLANEOUS) ×1
DEFOGGER SCOPE WARMER CLEARIFY (MISCELLANEOUS) ×3 IMPLANT
DERMABOND ADVANCED (GAUZE/BANDAGES/DRESSINGS) ×1
DERMABOND ADVANCED .7 DNX12 (GAUZE/BANDAGES/DRESSINGS) ×2 IMPLANT
DRAPE 3/4 80X56 (DRAPES) ×6 IMPLANT
DRAPE ARM DVNC X/XI (DISPOSABLE) ×8 IMPLANT
DRAPE COLUMN DVNC XI (DISPOSABLE) ×2 IMPLANT
DRAPE DA VINCI XI ARM (DISPOSABLE) ×4
DRAPE DA VINCI XI COLUMN (DISPOSABLE) ×1
ELECT REM PT RETURN 9FT ADLT (ELECTROSURGICAL) ×3
ELECTRODE REM PT RTRN 9FT ADLT (ELECTROSURGICAL) ×2 IMPLANT
GAUZE 4X4 16PLY ~~LOC~~+RFID DBL (SPONGE) ×3 IMPLANT
GLOVE SURG ENC MOIS LTX SZ7 (GLOVE) ×12 IMPLANT
GLOVE SURG UNDER LTX SZ7.5 (GLOVE) ×12 IMPLANT
GOWN STRL REUS W/ TWL LRG LVL3 (GOWN DISPOSABLE) ×16 IMPLANT
GOWN STRL REUS W/TWL LRG LVL3 (GOWN DISPOSABLE) ×8
GRASPER SUT TROCAR 14GX15 (MISCELLANEOUS) ×3 IMPLANT
IRRIGATION STRYKERFLOW (MISCELLANEOUS) IMPLANT
IRRIGATOR STRYKERFLOW (MISCELLANEOUS)
IV NS 1000ML (IV SOLUTION)
IV NS 1000ML BAXH (IV SOLUTION) IMPLANT
KIT PINK PAD W/HEAD ARE REST (MISCELLANEOUS) ×3
KIT PINK PAD W/HEAD ARM REST (MISCELLANEOUS) ×2 IMPLANT
KIT TURNOVER CYSTO (KITS) ×3 IMPLANT
LABEL OR SOLS (LABEL) ×3 IMPLANT
MANIFOLD NEPTUNE II (INSTRUMENTS) ×3 IMPLANT
MANIPULATOR VCARE LG CRV RETR (MISCELLANEOUS) IMPLANT
MANIPULATOR VCARE SML CRV RETR (MISCELLANEOUS) IMPLANT
MANIPULATOR VCARE STD CRV RETR (MISCELLANEOUS) ×3 IMPLANT
NS IRRIG 1000ML POUR BTL (IV SOLUTION) ×3 IMPLANT
OBTURATOR OPTICAL STANDARD 8MM (TROCAR) ×1
OBTURATOR OPTICAL STND 8 DVNC (TROCAR) ×2
OBTURATOR OPTICALSTD 8 DVNC (TROCAR) ×2 IMPLANT
OCCLUDER COLPOPNEUMO (BALLOONS) ×3 IMPLANT
PACK GYN LAPAROSCOPIC (MISCELLANEOUS) ×3 IMPLANT
PAD OB MATERNITY 4.3X12.25 (PERSONAL CARE ITEMS) ×3 IMPLANT
PAD PREP 24X41 OB/GYN DISP (PERSONAL CARE ITEMS) ×3 IMPLANT
SCISSORS METZENBAUM CVD 33 (INSTRUMENTS) IMPLANT
SCRUB CHG 4% DYNA-HEX 4OZ (MISCELLANEOUS) ×2 IMPLANT
SEAL CANN UNIV 5-8 DVNC XI (MISCELLANEOUS) ×8 IMPLANT
SEAL XI 5MM-8MM UNIVERSAL (MISCELLANEOUS) ×4
SEALER VESSEL DA VINCI XI (MISCELLANEOUS) ×1
SEALER VESSEL EXT DVNC XI (MISCELLANEOUS) ×2 IMPLANT
SET CYSTO W/LG BORE CLAMP LF (SET/KITS/TRAYS/PACK) IMPLANT
SET TUBE FILTERED XL AIRSEAL (SET/KITS/TRAYS/PACK) ×3 IMPLANT
SOLUTION ELECTROLUBE (MISCELLANEOUS) ×3 IMPLANT
SURGILUBE 2OZ TUBE FLIPTOP (MISCELLANEOUS) ×3 IMPLANT
SUT MNCRL 4-0 (SUTURE) ×1
SUT MNCRL 4-0 27XMFL (SUTURE) ×2
SUT VIC AB 0 CT2 27 (SUTURE) ×6 IMPLANT
SUT VLOC 90 2/L VL 12 GS22 (SUTURE) ×3 IMPLANT
SUTURE MNCRL 4-0 27XMF (SUTURE) ×2 IMPLANT

## 2021-05-08 NOTE — Anesthesia Preprocedure Evaluation (Signed)
Anesthesia Evaluation  Patient identified by MRN, date of birth, ID band Patient awake    Reviewed: Allergy & Precautions, H&P , NPO status , Patient's Chart, lab work & pertinent test results  History of Anesthesia Complications (+) PONV and history of anesthetic complications  Airway Mallampati: II  TM Distance: <3 FB Neck ROM: full  Mouth opening: Limited Mouth Opening  Dental  (+) Teeth Intact   Pulmonary asthma , former smoker,    Pulmonary exam normal        Cardiovascular Exercise Tolerance: Good Normal cardiovascular exam     Neuro/Psych  Headaches, Anxiety Depression Bipolar Disorder    GI/Hepatic GERD  Controlled,  Endo/Other  negative endocrine ROS  Renal/GU negative Renal ROS  negative genitourinary   Musculoskeletal negative musculoskeletal ROS (+)   Abdominal   Peds  Hematology  (+) Blood dyscrasia, anemia ,   Anesthesia Other Findings   Reproductive/Obstetrics negative OB ROS                             Anesthesia Physical  Anesthesia Plan  ASA: III  Anesthesia Plan: General   Post-op Pain Management:    Induction: Intravenous  PONV Risk Score and Plan: 4 or greater and Aprepitant, Promethazine, Dexamethasone, Ondansetron and TIVA  Airway Management Planned: Oral ETT  Additional Equipment:   Intra-op Plan:   Post-operative Plan: Extubation in OR  Informed Consent: I have reviewed the patients History and Physical, chart, labs and discussed the procedure including the risks, benefits and alternatives for the proposed anesthesia with the patient or authorized representative who has indicated his/her understanding and acceptance.     Dental Advisory Given  Plan Discussed with: Anesthesiologist, CRNA and Surgeon  Anesthesia Plan Comments:         Anesthesia Quick Evaluation

## 2021-05-08 NOTE — Transfer of Care (Signed)
Immediate Anesthesia Transfer of Care Note  Patient: Janet Rich  Procedure(s) Performed: XI ROBOTIC ASSISTED TOTAL LAPAROSCOPIC HYSTERECTOMY AND SALPINGECTOMY (Bilateral) LYSIS OF ADHESION, EXCISION OF ENDOMETRIOSIS  Patient Location: PACU  Anesthesia Type:General  Level of Consciousness: drowsy  Airway & Oxygen Therapy: Patient Spontanous Breathing  Post-op Assessment: Report given to RN and Post -op Vital signs reviewed and stable  Post vital signs: Reviewed and stable  Last Vitals:  Vitals Value Taken Time  BP 113/59 05/08/21 1220  Temp    Pulse 77 05/08/21 1222  Resp 17 05/08/21 1222  SpO2 90 % 05/08/21 1222  Vitals shown include unvalidated device data.  Last Pain:  Vitals:   05/08/21 0806  TempSrc: Temporal  PainSc: 0-No pain         Complications: No notable events documented.

## 2021-05-08 NOTE — Op Note (Addendum)
[place] Plan PROCEDURE DATE: 05/08/2021  PREOPERATIVE DIAGNOSIS: Chronic pelvic pain, endometriosis, failed medical management POSTOPERATIVE DIAGNOSIS: The same PROCEDURE:  XI ROBOTIC ASSISTED TOTAL LAPAROSCOPIC HYSTERECTOMY AND SALPINGECTOMY:  LYSIS OF ADHESION, EXCISION OF ENDOMETRIOSIS: [id number]  SURGEON:  Dr. Christeen Douglas, MD ASSISTANT: CST Anesthesiologist:  Anesthesiologist: Foye Deer, MD CRNA: Irving Burton, CRNA; Joanette Gula, Summer, CRNA; Cheral Bay, CRNA  INDICATIONS: 25 y.o. [phone]  here for definitive surgical management secondary to the indications listed under preoperative diagnoses; please see preoperative note for further details.    Previously dx with endo by excision  Risks of surgery were discussed with the patient including but not limited to: bleeding which may require transfusion or reoperation; infection which may require antibiotics; injury to bowel, bladder, ureters or other surrounding organs; need for additional procedures; thromboembolic phenomenon, incisional problems and other postoperative/anesthesia complications. Written informed consent was obtained.    FINDINGS:   Pelvic: External genitalia negative for lesions. Vagina negative. Adnexa negative for masses or nodularity. Cervix without gross lesions. Uterus mobile, anteverted, small.   Intraoperative findings revealed a normal upper abdomen including bowel, diaphragmatic surfaces, stomach, and omentum. Normal appendix  The uterus was small. Brown endometriosis and white scarring noted along the left ovarian fossa and left ureterosacral ligament. Both ovaries with endometriosis powder burn spots, and an endometrioma on the right ovary. The right ovary was significantly scarred into the pelvis between the ovarian fossa and the posterior cervix at the level of the internal os. Lysis of adhesions here ruptured an endometrioma which was removed.   ANESTHESIA:    General INTRAVENOUS  FLUIDS:700  ml ESTIMATED BLOOD LOSS:20 ml URINE OUTPUT: 200 ml  SPECIMENS: Uterus, cervix, bilateral fallopian tubesCOMPLICATIONS: None immediate   RATLH/BS:  PROCEDURE IN DETAIL: After informed consent was obtained, the patient was taken to the operating room where general anesthesia was obtained without difficulty. The patient was positioned in the dorsal lithotomy position in [place] stirrups and her arms were carefully tucked at her sides and the usual precautions were taken. Deep Trendelenburg (20-25 deg) was established to confirm that she does not shift on the table.  She was prepped and draped in normal sterile fashion.  Time-out was performed and a Foley catheter was placed into the bladder. A standard VCare uterine manipulator was then placed in the uterus without incident.  Preoperative prophylactic antibiotics were given through her iv.  After infiltration of local anesthetic at the proposed trocar sites, an 8 mm incision was created at the umbilicus, and an AirSeal 8mm was placed under direct visualization, after confirmation of OG tube working well. Pneumoperitoneum was created to a pressure of 15 mm Hg. The camera was placed and the abdomin surveyed, noting intact bowel below the site of entry. A survey of the pelvis and upper abdomen revealed the above findings. Two right and left lateral 8-mm robotic ports were placed under direct visualization.  The patient was placed in deepTrendelenburg and the bowel was displaced up into the upper abdomen. The robot was left side docked. The instruments were placed under direct visualization.   The ureters were identified bilaterally coursing outside of the operative field. Round ligaments were divided on each side with the EndoShears and the retroperitoneal space was opened bilaterally. The posterior leaflet of the broad was taken down to the level of the IP ligament. The anterior leaflet of the broad ligament was carefully taken down to the  midline.  A bladder flap was created and the bladder was dissected down off  the lower uterine segment and cervix using endoshears and electrocautery.   The Fallopian tubes were divided from the ovaries, and care taken to hemostatically transect the utero-ovarian ligament. The peritoneum was taken down to the level of the internal os, and the uterine arteries skeletonized. With strong cephalad pressure from the V-care, bipolar cautery was used to seal and transect the uterine arteries, and the pedicles allowed to fall away laterally.  A colpotomy was performed circumferentially along the V-Care ring with monopolar electrocautery and the cervix was incised from the vagina using the laparoscopic scissors. The specimen was removed through the vagina.  A pneumo balloon was placed in the vagina and the vaginal cuff was then closed in a running continuous fashion using the  0 V-Lock suture with careful attention to include the vaginal cuff angles, the uterosacral ligaments and the vaginal mucosa within the closure.  Hemostasis was secured with intraabdominal pressure and review of all surgical sites. The intraperitoneal pressure was dropped, and all planes of dissection, vascular pedicles and the vaginal cuff were found to be hemostatic.  The robot was undocked. The lateral trocars were removed under visualization.  The CO2 gas was released and several deep breaths given to remove any remaining CO2 from the peritoneal cavity.  The skin incisions were closed with 4-0 Monocryl subcuticular stitch and Dermabond.    Anesthesia was reversed without difficulty.  The patient tolerated the procedure well.  Sponge, lap and needle counts were correct x2.  The patient was taken to recovery room in excellent condition.  She has previously had significant postop pain with her chronic pelvic pain hx, and I will keep her overnight for monitoring and to check her kidney function in the am

## 2021-05-08 NOTE — Anesthesia Procedure Notes (Signed)
Procedure Name: Intubation Date/Time: 05/08/2021 9:40 AM Performed by: Cheral Bay, CRNA Pre-anesthesia Checklist: Patient identified, Emergency Drugs available, Suction available and Patient being monitored Patient Re-evaluated:Patient Re-evaluated prior to induction Oxygen Delivery Method: Circle system utilized Preoxygenation: Pre-oxygenation with 100% oxygen Induction Type: IV induction Ventilation: Mask ventilation without difficulty Laryngoscope Size: McGraph and 3 Grade View: Grade I Tube type: Oral Number of attempts: 1 Airway Equipment and Method: Stylet Placement Confirmation: ETT inserted through vocal cords under direct vision, positive ETCO2 and breath sounds checked- equal and bilateral Tube secured with: Tape Dental Injury: Teeth and Oropharynx as per pre-operative assessment

## 2021-05-08 NOTE — Interval H&P Note (Signed)
History and Physical Interval Note:  05/08/2021 8:55 AM  Janet Rich  has presented today for surgery, with the diagnosis of Endometriosis.  The various methods of treatment have been discussed with the patient and family. After consideration of risks, benefits and other options for treatment, the patient has consented to  Procedure(s): XI ROBOTIC ASSISTED TOTAL LAPAROSCOPIC HYSTERECTOMY AND SALPINGECTOMY (Bilateral) LYSIS OF ADHESION, EXCISION OF ENDOMETRIOSIS (N/A) as a surgical intervention.  The patient's history has been reviewed, patient examined, no change in status, stable for surgery.  I have reviewed the patient's chart and labs.  Questions were answered to the patient's satisfaction.     Christeen Douglas

## 2021-05-08 NOTE — Telephone Encounter (Signed)
Pt did not answer left vm sent to [facility].  Looks like she is currently admitted.

## 2021-05-08 NOTE — Discharge Instructions (Addendum)
Discharge instructions after  robotically-assisted total laparoscopic hysterectomy   For the next three days, take ibuprofen and acetaminophen on a schedule, every 8 hours. You can take them together or you can intersperse them, and take one every four hours. I also gave you gabapentin for nighttime, to help you sleep and also to control pain. Take gabapentin medicines at night for at least the next 3 nights. You also have a narcotic, oxycodone, to take as needed if the above medicines don't help.  Postop constipation is a major cause of pain. Stay well hydrated, walk as you tolerate, and take over the counter senna as well as stool softeners if you need them.   Signs and Symptoms to Report Call our office at [phone] if you have any of the following.   Fever over 100.4 degrees or higher  Severe stomach pain not relieved with pain medications  Bright red bleeding that's heavier than a period that does not slow with rest  To go the bathroom a lot (frequency), you can't hold your urine (urgency), or it hurts when you empty your bladder (urinate)  Chest pain  Shortness of breath  Pain in the calves of your legs  Severe nausea and vomiting not relieved with anti-nausea medications  Signs of infection around your wounds, such as redness, hot to touch, swelling, green/yellow drainage (like pus), bad smelling discharge  Any concerns  What You Can Expect after Surgery  You may see some pink tinged, bloody fluid and bruising around the wound. This is normal.  You may notice shoulder and neck pain. This is caused by the gas used during surgery to expand your abdomen so your surgeon could get to the uterus easier.  You may have a sore throat because of the tube in your mouth during general anesthesia. This will go away in 2 to 3 days.  You may have some stomach cramps.  You may notice spotting on your panties.  You may have pain around the incision sites.   Activities after Your  Discharge Follow these guidelines to help speed your recovery at home:  Do the coughing and deep breathing as you did in the hospital for 2 weeks. Use the small blue breathing device, called the incentive spirometer for 2 weeks.  Don't drive if you are in pain or taking narcotic pain medicine. You may drive when you can safely slam on the brakes, turn the wheel forcefully, and rotate your torso comfortably. This is typically 1-2 weeks. Practice in a parking lot or side street prior to attempting to drive regularly.   Ask others to help with household chores for 4 weeks.  Do not lift anything heavier that 10 pounds for 4-6 weeks. This includes pets, children, and groceries.  Don't do strenuous activities, exercises, or sports like vacuuming, tennis, squash, etc. until your doctor says it is safe to do so. ---Maintain pelvic rest for 12 weeks. This means nothing in the vagina or rectum at all (no douching, tampons, intercourse) for 12 weeks.   Walk as you feel able. Rest often since it may take two or three weeks for your energy level to return to normal.   You may climb stairs  Avoid constipation:   -Eat fruits, vegetables, and whole grains. Eat small meals as your appetite will take time to return to normal.   -Drink 6 to 8 glasses of water each day unless your doctor has told you to limit your fluids.   -Use a laxative or   stool softener as needed if constipation becomes a problem. You may take Miralax, metamucil, Citrucil, Colace, Senekot, FiberCon, etc. If this does not relieve the constipation, try two tablespoons of Milk Of Magnesia every 8 hours until your bowels move.   You may shower. Gently wash the wounds with a mild soap and water. Pat dry.  Do not get in a hot tub, swimming pool, etc. for 6 weeks.  Do not use lotions, oils, powders on the wounds.  Do not douche, use tampons, or have sex until your doctor says it is okay.  Take your pain medicine when you need it. The medicine may not  work as well if the pain is bad.  Take the medicines you were taking before surgery. Other medications you will need are pain medications (Norco or Percocet) and nausea medications (Zofran).   Here is a helpful article from the website [employer], regarding constipation  Here are reasons why constipation occurs after surgery: 1) During the operation and in the recovery room, most people are given opioid pain medication, primarily through an IV, to treat moderate or severe pain. Intravenous opioids include morphine, Dilaudid and fentanyl. After surgery, patients are often prescribed opioid pain medication to take by mouth at home, including codeine, Vicodin, Norco, and Percocet. All of these medications cause constipation by slowing down the movement of your intestine. 2) Changes in your diet before surgery can be another culprit. It is common to get specific instructions to change how you normally eat or drink before your surgery, like only having liquids the day before or not having anything to eat or drink after midnight the night before surgery. For this reason, temporary dehydration may occur. This, along with not eating or only having liquids, means that you are getting less fiber than usual. Both these factors contribute to constipation. 3) Changes in your diet after surgery can also contribute to the problem. Although many people don't have dietary restrictions after operations, being under anesthesia can make you lose your appetite for several hours and maybe even days. Some people can even have nausea or vomiting. Not eating or drinking normally means that you are not getting enough fiber and you can get dehydrated, both leading to constipation. 4) Lying in a bed more than usual--which happens before, during and after surgery--combined with the medications and diet changes, all work together to slow down your colon and make your poop turn to rock.  No one likes to be constipated.  Let's face  it, it's not a pleasant feeling when you don't poop for days, then strain on the toilet to finally pass something large enough to cause damage. An ounce of prevention is worth a pound of cure, so: Assume you will be constipated. Plan and prepare accordingly. Post-surgery is one of those unique situations where the temporary use of laxatives can make a world of difference. Always consult with your doctor, and recognize that if you wait several days after surgery to take a laxative, the constipation might be too severe for these over-the-counter options. It is always important to discuss all medications you plan on taking with your doctor. Ask your doctor if you can start the laxative immediately after surgery. *  Here are go-to post-surgery laxatives: Senna: Senna is an herb that acts as a "stimulant laxative," meaning it increases the activity of the intestine to cause you to have a bowel movement. It comes in many forms, but senna pills are easy to take and are sold over the   counter at almost all pharmacies. Since opioid pain medications slow down the activity of the intestine, it makes sense to take a medication to help reverse that side effect. Long-term use of a stimulant laxative is not a good idea since it can make your colon "lazy" and not function properly; however, temporary use immediately after surgery is acceptable. In general, if you are able to eat a normal diet, taking senna soon after surgery works the best. Senna usually works within hours to produce a bowel movement, but this is less predictable when you are taking different medications after surgery. Try not to wait several days to start taking senna, as often it is too late by then. Just like with all medications or supplements, check with your doctor before starting new treatment.   Magnesium: Magnesium is an important mineral that our body needs. We get magnesium from some foods that we eat, especially foods that are high in fiber such  as broccoli, almonds and whole grains. There are also magnesium-based medications used to treat constipation including milk of magnesia (magnesium hydroxide), magnesium citrate and magnesium oxide. They work by drawing water into the intestine, putting it into the class of "osmotic" laxatives. Magnesium products in low doses appear to be safe, but if taken in very large doses, can lead to problems such as irregular heartbeat, low blood pressure and even death. It can also affect other medications you might be taking, therefore it is important to discuss using magnesium with your physician and pharmacist before initiating therapy. Most over-the-counter magnesium laxatives work very well to help with the constipation related to surgery, but sometimes they work too well and lead to diarrhea. Make sure you are somewhere with easy access to a bathroom, just in case.   Bisacodyl: Bisacodyl (generic name) is sold under brand names such as Dulcolax. Much like senna, it is a "stimulant laxative," meaning it makes your intestines move more quickly to push out the stool. This is another good choice to start taking as soon as your doctor says you can take a laxative after surgery. It comes in pill form and as a suppository, which is a good choice for people who cannot or are not allowed to swallow pills. Studies have shown that it works as a laxative, but like most of these medications, you should use this on a short-term basis only.   Enema: Enemas strike fear in many people, but FEAR NOT! It's nowhere near as big a deal as you may think. An enema is just a way to get some liquid into your rectum by placing a specially designed device through your anus. If you have never done one, it might seem like a painful, unpleasant, uncomfortable, complicated and lengthy procedure. But in reality, it's simple, takes just a few seconds and is highly effective. The small ready-made bottles you buy at the pharmacy are much easier than  the hose/large rubber container type. Those recommended positions illustrated in some instructions are generally not necessary to place the enema. It's very similar to the insertion of a tampon, requiring a slight squat. Some extra lubrication on the enema's tip (or on your anus) will make it a breeze. In certain cases, there is no substitute for a good enema. For example, if someone has not pooped for a few days, the beginning of the poop waiting to come out can become rock hard. Passing that hard stool can lead to much pain and problems like anal fissures. Inserting a little liquid to   break up the rock-hard stool will help make its passage much easier. Enemas come with different liquids. Most come with saline, but there are also mineral oil options. You can also use warm water in the reusable enema containers. They all work. But since saline can sometimes be irritating, so try a mineral oil or water enema instead.  Here are commonly recommended constipation medications that do not work well for post-surgery constipation: Docusate: Docusate (generic name) most commonly referred to as Colace (brand name) is not really a laxative, but is classified as a stool softener. Although this medication is commonly prescribed, it is not recommended for several reasons: 1) there is no good medical evidence that it works 2) even if it has an effect, which is very questionable, it is minimal and cannot combat the intestinal slowing caused by the opioid medications. Skip docusate to save money and space in your pillbox for something more effective.  PEG: Miralax (brand name) is basically a chemical called polyethylene glycol (PEG) and it has gained tremendous popularity as a laxative. This product is an "osmotic laxative" meaning it works by pulling water into the stool, making it softer. This is very similar to the action of natural fiber in foods and supplements. Therefore, the effect seen by this medication is not  immediate, causing a bowel movement in a day or more. Is this medication strong enough to battle the constipation related to having an operation? Maybe for some people not prone to constipation. But for most people, other laxatives are better to prevent constipation after surgery.  

## 2021-05-09 ENCOUNTER — Encounter: Payer: Self-pay | Admitting: Obstetrics and Gynecology

## 2021-05-09 DIAGNOSIS — J45909 Unspecified asthma, uncomplicated: Secondary | ICD-10-CM | POA: Diagnosis not present

## 2021-05-09 DIAGNOSIS — Z79899 Other long term (current) drug therapy: Secondary | ICD-10-CM | POA: Diagnosis not present

## 2021-05-09 DIAGNOSIS — N393 Stress incontinence (female) (male): Secondary | ICD-10-CM | POA: Diagnosis not present

## 2021-05-09 DIAGNOSIS — K660 Peritoneal adhesions (postprocedural) (postinfection): Secondary | ICD-10-CM | POA: Diagnosis not present

## 2021-05-09 DIAGNOSIS — Z7982 Long term (current) use of aspirin: Secondary | ICD-10-CM | POA: Diagnosis not present

## 2021-05-09 DIAGNOSIS — N800 Endometriosis of uterus: Secondary | ICD-10-CM | POA: Diagnosis not present

## 2021-05-09 LAB — BASIC METABOLIC PANEL
Anion gap: 8 (ref 5–15)
BUN: 7 mg/dL (ref 6–20)
CO2: 26 mmol/L (ref 22–32)
Calcium: 9 mg/dL (ref 8.9–10.3)
Chloride: 103 mmol/L (ref 98–111)
Creatinine, Ser: 0.49 mg/dL (ref 0.44–1.00)
GFR, Estimated: >60 mL/min (ref >60)
Glucose, Bld: 111 mg/dL — ABNORMAL HIGH (ref 70–99)
Potassium: 4.1 mmol/L (ref 3.5–5.1)
Sodium: 137 mmol/L (ref 135–145)

## 2021-05-09 LAB — CBC
HCT: 37.5 % (ref 36.0–46.0)
Hemoglobin: 12 g/dL (ref 12.0–15.0)
MCH: 27 pg (ref 26.0–34.0)
MCHC: 32 g/dL (ref 30.0–36.0)
MCV: 84.3 fL (ref 80.0–100.0)
Platelets: 347 10*3/uL (ref 150–400)
RBC: 4.45 MIL/uL (ref 3.87–5.11)
RDW: 13.7 % (ref 11.5–15.5)
WBC: 13.3 10*3/uL — ABNORMAL HIGH (ref 4.0–10.5)
nRBC: 0 % (ref 0.0–0.2)

## 2021-05-09 LAB — SURGICAL PATHOLOGY

## 2021-05-09 MED ORDER — ACETAMINOPHEN 500 MG PO TABS: 1000.0000 mg | ORAL_TABLET | Freq: Four times a day (QID) | ORAL | 0 refills | Status: AC

## 2021-05-09 MED ORDER — GABAPENTIN 800 MG PO TABS: 800.0000 mg | ORAL_TABLET | Freq: Every day | ORAL | 0 refills | Status: DC

## 2021-05-09 MED ORDER — DOCUSATE SODIUM 100 MG PO CAPS: 100.0000 mg | ORAL_CAPSULE | Freq: Two times a day (BID) | ORAL | 0 refills | Status: DC

## 2021-05-09 MED ORDER — ONDANSETRON 4 MG PO TBDP
4.0000 mg | ORAL_TABLET | Freq: Four times a day (QID) | ORAL | 0 refills | Status: DC | PRN
Start: 2021-05-09 — End: 2021-10-09

## 2021-05-09 MED ORDER — IBUPROFEN 800 MG PO TABS: 800.0000 mg | ORAL_TABLET | Freq: Three times a day (TID) | ORAL | 1 refills | Status: AC

## 2021-05-09 MED ORDER — OXYCODONE HCL 5 MG PO TABS: 5.0000 mg | ORAL_TABLET | ORAL | 0 refills | Status: DC | PRN

## 2021-05-09 NOTE — Discharge Summary (Addendum)
1 Day Post-Op       Procedure(s): XI ROBOTIC ASSISTED TOTAL LAPAROSCOPIC HYSTERECTOMY AND SALPINGECTOMY (Bilateral) LYSIS OF ADHESION, EXCISION OF ENDOMETRIOSIS (N/A) Subjective: The patient is doing well.  Minimal nausea with regular diet, much improved from yesterday, but no vomiting. Pain is adequately controlled with PO meds.  Objective: Vital signs in last 24 hours: Temp:  [97.4 F (36.3 C)-98.3 F (36.8 C)] 98.3 F (36.8 C) (07/22 0744) Pulse Rate:  [67-80] 72 (07/22 0744) Resp:  [11-20] 20 (07/22 0744) BP: (109-122)/(54-75) 115/65 (07/22 0744) SpO2:  [91 %-100 %] 100 % (07/22 0744) Weight:  [94.3 kg] 94.3 kg (07/21 1531)  Intake/Output  Intake/Output Summary (Last 24 hours) at 05/09/2021 1138 Last data filed at 05/09/2021 0800 Gross per 24 hour  Intake 2729.62 ml  Output 3170 ml  Net -440.38 ml    Physical Exam:  General: Alert and oriented. CV: RRR Lungs: Clear bilaterally. GI: Soft, Nondistended. Incisions: Clean and dry. Urine: Clear, Foley in place Extremities: Nontender, no erythema, no edema.  Lab Results: Recent Labs    05/09/21 0633  HGB 12.0  HCT 37.5  WBC 13.3*  PLT 347                 Results for orders placed or performed during the hospital encounter of 05/08/21 (from the past 24 hour(s))  CBC     Status: Abnormal   Collection Time: 05/09/21  6:33 AM  Result Value Ref Range   WBC 13.3 (H) 4.0 - 10.5 K/uL   RBC 4.45 3.87 - 5.11 MIL/uL   Hemoglobin 12.0 12.0 - 15.0 g/dL   HCT 69.6 29.5 - 28.4 %   MCV 84.3 80.0 - 100.0 fL   MCH 27.0 26.0 - 34.0 pg   MCHC 32.0 30.0 - 36.0 g/dL   RDW 13.2 44.0 - 10.2 %   Platelets 347 150 - 400 K/uL   nRBC 0.0 0.0 - 0.2 %  Basic metabolic panel     Status: Abnormal   Collection Time: 05/09/21  6:33 AM  Result Value Ref Range   Sodium 137 135 - 145 mmol/L   Potassium 4.1 3.5 - 5.1 mmol/L   Chloride 103 98 - 111 mmol/L   CO2 26 22 - 32 mmol/L   Glucose, Bld 111 (H) 70 - 99 mg/dL   BUN 7 6 - 20 mg/dL    Creatinine, Ser 7.25 0.44 - 1.00 mg/dL   Calcium 9.0 8.9 - 36.6 mg/dL   GFR, Estimated >44 >03 mL/min   Anion gap 8 5 - 15    Assessment/Plan: 1 Day Post-Op       Procedure(s): XI ROBOTIC ASSISTED TOTAL LAPAROSCOPIC HYSTERECTOMY AND SALPINGECTOMY (Bilateral) LYSIS OF ADHESION, EXCISION OF ENDOMETRIOSIS (N/A)  -Ambulate, Incentive spirometry -Advance diet as tolerated -Transition to oral pain medication -Discharge home today anticipated    Christeen Douglas, MD   LOS: 0 days   Christeen Douglas 05/09/2021, 11:38 AM

## 2021-05-09 NOTE — Progress Notes (Signed)
Patient discharged home. Rx sent to pharmacy. Post-op instructions given and reviewed with patient. Patient verbalized understanding. Patient escorted out by auxiliary.   Peter Minium 05/09/2021 12:52 PM

## 2021-05-10 NOTE — Anesthesia Postprocedure Evaluation (Signed)
Anesthesia Post Note  Patient: Janet Rich  Procedure(s) Performed: XI ROBOTIC ASSISTED TOTAL LAPAROSCOPIC HYSTERECTOMY AND SALPINGECTOMY (Bilateral) LYSIS OF ADHESION, EXCISION OF ENDOMETRIOSIS  Patient location during evaluation: PACU Anesthesia Type: General Level of consciousness: awake and alert Pain management: pain level controlled Vital Signs Assessment: post-procedure vital signs reviewed and stable Respiratory status: spontaneous breathing, nonlabored ventilation and respiratory function stable Cardiovascular status: blood pressure returned to baseline and stable Postop Assessment: no apparent nausea or vomiting Anesthetic complications: no   No notable events documented.   Last Vitals:  Vitals:   05/09/21 0324 05/09/21 0744  BP: 122/71 115/65  Pulse: 75 72  Resp: 16 20  Temp: 36.7 C 36.8 C  SpO2: 100% 100%    Last Pain:  Vitals:   05/09/21 1045  TempSrc:   PainSc: 5                  Foye Deer

## 2021-05-12 ENCOUNTER — Ambulatory Visit: Payer: Medicaid Other

## 2021-05-12 ENCOUNTER — Telehealth: Payer: Self-pay

## 2021-05-12 NOTE — Telephone Encounter (Signed)
Transition Care Management Unsuccessful Follow-up Telephone Call  Date of discharge and from where:  05/09/2021-[facility]  Attempts:  1st Attempt  Reason for unsuccessful TCM follow-up call:  Left voice message

## 2021-05-13 NOTE — Telephone Encounter (Signed)
Transition Care Management Follow-up Telephone Call Date of discharge and from where: 05/09/2021-[facility] How have you been since you were released from the hospital? Patient stated she is doing fine.  Any questions or concerns? No  Items Reviewed: Did the pt receive and understand the discharge instructions provided? Yes  Medications obtained and verified? Yes  Other? No  Any new allergies since your discharge? No  Dietary orders reviewed? N/A Do you have support at home? Yes   Home Care and Equipment/Supplies: Were home health services ordered? not applicable If so, what is the name of the agency? N/A  Has the agency set up a time to come to the patient's home? not applicable Were any new equipment or medical supplies ordered?  No What is the name of the medical supply agency? N/A Were you able to get the supplies/equipment? not applicable Do you have any questions related to the use of the equipment or supplies? No  Functional Questionnaire: (I = Independent and D = Dependent) ADLs: I  Bathing/Dressing- I  Meal Prep- I  Eating- I  Maintaining continence- I  Transferring/Ambulation- I  Managing Meds- I  Follow up appointments reviewed:  PCP Hospital f/u appt confirmed? Yes  Scheduled to see Danelle Berry on 05/27/2021 @ 9:00 am. [facility] f/u appt confirmed? Yes  Scheduled to see Dr. Dalbert Garnet on 05/22/2021 @ 1:45 pm. Are transportation arrangements needed? No  If their condition worsens, is the pt aware to call PCP or go to the Emergency Dept.? Yes Was the patient provided with contact information for the PCP's office or ED? Yes Was to pt encouraged to call back with questions or concerns? Yes

## 2021-05-15 ENCOUNTER — Ambulatory Visit: Payer: Medicaid Other | Admitting: Licensed Clinical Social Worker

## 2021-05-19 ENCOUNTER — Ambulatory Visit: Payer: Medicaid Other

## 2021-05-19 ENCOUNTER — Ambulatory Visit: Payer: Medicaid Other | Admitting: Licensed Clinical Social Worker

## 2021-05-19 DIAGNOSIS — F411 Generalized anxiety disorder: Secondary | ICD-10-CM

## 2021-05-19 DIAGNOSIS — Z419 Encounter for procedure for purposes other than remedying health state, unspecified: Secondary | ICD-10-CM | POA: Diagnosis not present

## 2021-05-19 DIAGNOSIS — F3181 Bipolar II disorder: Secondary | ICD-10-CM

## 2021-05-19 NOTE — Progress Notes (Signed)
Counselor/Therapist Progress Note  [name], MRN: [id number],    Date: 05/19/2021  Time Spent: 37 minutes    Treatment Type: Psychotherapy  Reported Symptoms: Fatigue, Physical aches and pain, and intermittent sadness, low mood; anxiety, anxious thoughts  Mental Status Exam:  Appearance:   Casual     Behavior:  Appropriate and Sharing  Motor:  Normal  Speech/Language:   Clear and Coherent and Normal Rate  Affect:  Appropriate, Congruent, and Full Range  Mood:  euthymic  Thought process:  goal directed  Thought content:    WNL  Sensory/Perceptual disturbances:    WNL  Orientation:  oriented to person, place, time/date, and situation  Attention:  Good  Concentration:  Good  Memory:  WNL  Fund of knowledge:   Good  Insight:    Good  Judgment:   Good  Impulse Control:  Good   Risk Assessment: Danger to Self:  No Self-injurious Behavior: No Danger to Others: No Duty to Warn:no Physical Aggression / Violence:No  Access to Firearms a concern: No  Gang Involvement:No   Subjective: Patient was engaged and cooperative throughout the session using time to discuss thoughts and feelings. Patient voices continued motivation for treatment and understanding of mood and anxiety issues. Patient is likely to benefit from future treatment because she remains motivated to manage symptoms and reports benefit of regular sessions in addressing symptoms.   Interventions: Cognitive Behavioral Therapy Established psychological safety. Checked in with patient regarding her week. Engaged patient in processing current psychosocial stressors, mild mood instability and increased emotionality due to recent surgery and recovery time. Reviewed strategies for patient to engage in while she is recovering from surgery. Provided support through active listening, validation of feelings, and highlighted patient's strengths.   Diagnosis:   ICD-10-CM   1. Generalized anxiety disorder  F41.1      2. Bipolar 2 disorder (HCC)  F31.81      Plan: Patient's goal is: Help with anxiety and working through things. She reports benefit from having someone to listen to her.   Treatment Target: Increase realistic balanced thinking Explore patient's thoughts, beliefs, automatic thoughts, assumptions Identify unhelpful thinking patterns Process distress and allow for emotional release Questioning and challenging thoughts Cognitive reappraisal   Treatment Target: Increase coping skills Mindfulness practices Deep breathing STOP technique Grounding exercises Treatment Target: Reducing vulnerability to "emotional mind" Values clarification   Self-care - nutrition, sleep, exercise Increase living within values  Teach distress tolerance techniques - "what helps me"   Future Appointments  Date Time Provider Department Center  05/27/2021  9:00 AM Danelle Berry, PA-C [facility]  06/03/2021  3:00 PM Kathreen Cosier, LCSW [facility] None  06/16/2021  1:00 PM [facility] PHARMACIST [facility] None   Kathreen Cosier, LCSW

## 2021-05-27 ENCOUNTER — Ambulatory Visit: Payer: Medicaid Other | Admitting: Family Medicine

## 2021-06-03 ENCOUNTER — Ambulatory Visit: Payer: Medicaid Other | Admitting: Licensed Clinical Social Worker

## 2021-06-03 DIAGNOSIS — R700 Elevated erythrocyte sedimentation rate: Secondary | ICD-10-CM | POA: Diagnosis not present

## 2021-06-03 DIAGNOSIS — M791 Myalgia, unspecified site: Secondary | ICD-10-CM | POA: Diagnosis not present

## 2021-06-03 DIAGNOSIS — N939 Abnormal uterine and vaginal bleeding, unspecified: Secondary | ICD-10-CM | POA: Diagnosis not present

## 2021-06-03 DIAGNOSIS — R102 Pelvic and perineal pain: Secondary | ICD-10-CM | POA: Diagnosis not present

## 2021-06-03 DIAGNOSIS — Z90710 Acquired absence of both cervix and uterus: Secondary | ICD-10-CM | POA: Diagnosis not present

## 2021-06-03 DIAGNOSIS — Z09 Encounter for follow-up examination after completed treatment for conditions other than malignant neoplasm: Secondary | ICD-10-CM | POA: Diagnosis not present

## 2021-06-03 NOTE — Progress Notes (Unsigned)
Counselor/Therapist Progress Note  [name], MRN: [id number],    Date: 06/03/2021  Time Spent: ***   Treatment Type: Psychotherapy  Reported Symptoms: {CHL AMB Reported [phone]}  Mental Status Exam:  Appearance:   {PSY:22683}     Behavior:  {PSY:21022743}  Motor:  {PSY:22302}  Speech/Language:   {PSY:22685}  Affect:  {PSY:22687}  Mood:  {PSY:31886}  Thought process:  {PSY:31888}  Thought content:    {PSY:386-387-3971}  Sensory/Perceptual disturbances:    {PSY:650-288-7912}  Orientation:  {PSY:30297}  Attention:  {PSY:22877}  Concentration:  {PSY:781-438-3083}  Memory:  {PSY:201-554-2121}  Fund of knowledge:   {PSY:781-438-3083}  Insight:    {PSY:781-438-3083}  Judgment:   {PSY:781-438-3083}  Impulse Control:  {PSY:781-438-3083}   Risk Assessment: Danger to Self:  No Self-injurious Behavior: No Danger to Others: No Duty to Warn:no Physical Aggression / Violence:No  Access to Firearms a concern: No  Gang Involvement:No   Subjective: Patient was engaged and cooperative throughout the session using time effectively to discuss   Patient voices continued motivation for treatment and understanding of  . Patient is likely to benefit from future treatment because  remains motivated to decrease  And   and reports benefit of regular sessions in addressing these symptoms.   Interventions: Cognitive Behavioral Therapy and Mindfulness Meditation Established psychological safety.  Provided support through active listening, validation of feelings, and highlighted patient's strengths.   Diagnosis:   ICD-10-CM   1. Generalized anxiety disorder  F41.1     2. Bipolar 2 disorder (HCC)  F31.81       Plan: Patient's goal is: Help with anxiety and working through things. She reports benefit from having someone to listen to her.   Treatment Target: Increase realistic balanced thinking Explore patient's thoughts, beliefs, automatic thoughts, assumptions Identify  unhelpful thinking patterns Process distress and allow for emotional release Questioning and challenging thoughts Cognitive reappraisal   Treatment Target: Increase coping skills Mindfulness practices Deep breathing STOP technique Grounding exercises Treatment Target: Reducing vulnerability to "emotional mind" Values clarification   Self-care - nutrition, sleep, exercise Increase living within values  Teach distress tolerance techniques - "what helps me"  Future Appointments  Date Time Provider Department Center  06/03/2021  3:00 PM Kathreen Cosier, LCSW [facility] None  06/16/2021  1:00 PM [facility] PHARMACIST [facility] None  07/14/2021  2:00 PM Zerita Boers L, PT [facility] None  07/21/2021  2:00 PM Sherren Kerns, PT [facility] None  07/28/2021  2:00 PM Sherren Kerns, PT [facility] None  08/04/2021  2:00 PM Sherren Kerns, PT [facility] None  08/11/2021  2:00 PM Sherren Kerns, PT [facility] None  08/18/2021  2:00 PM Sherren Kerns, PT [facility] None  08/25/2021  2:00 PM Sherren Kerns, PT [facility] None  09/01/2021  2:00 PM Sherren Kerns, PT [facility] None  09/08/2021  2:00 PM Sherren Kerns, PT [facility] None  09/15/2021  2:00 PM Sherren Kerns, PT [facility] None  09/22/2021  2:00 PM Sherren Kerns, PT [facility] None  09/29/2021  2:00 PM Sherren Kerns, PT [facility] None  10/06/2021  2:00 PM Sherren Kerns, PT [facility] None     Kathreen Cosier, LCSW

## 2021-06-10 ENCOUNTER — Ambulatory Visit: Payer: Medicaid Other | Admitting: Licensed Clinical Social Worker

## 2021-06-10 DIAGNOSIS — F411 Generalized anxiety disorder: Secondary | ICD-10-CM

## 2021-06-10 DIAGNOSIS — F3181 Bipolar II disorder: Secondary | ICD-10-CM

## 2021-06-10 NOTE — Progress Notes (Signed)
Counselor/Therapist Progress Note  [name], MRN: [id number],    Date: 06/10/2021  Time Spent: 40 minutes   Treatment Type: Psychotherapy  Reported Symptoms: Fatigue, Physical aches and pain, and low energy, overwhelmed   Mental Status Exam:  Appearance:   Casual     Behavior:  Appropriate and Sharing  Motor:  Normal  Speech/Language:   Clear and Coherent and Normal Rate  Affect:  Appropriate, Congruent, and Full Range  Mood:  euthymic  Thought process:  normal  Thought content:    WNL  Sensory/Perceptual disturbances:    WNL  Orientation:  oriented to person, place, time/date, situation, and day of week  Attention:  Good  Concentration:  Good  Memory:  WNL  Fund of knowledge:   Good  Insight:    Good  Judgment:   Good  Impulse Control:  Good   Risk Assessment: Danger to Self:  No Self-injurious Behavior: No Danger to Others: No Duty to Warn:no Physical Aggression / Violence:No  Access to Firearms a concern: No  Gang Involvement:No   Subjective: Patient was engaged and cooperative throughout the session using time effectively to discuss thoughts and feelings. Patient voices continued motivation for treatment and understanding of mood and anxiety issues. Patient is likely to benefit from future treatment because she remains motivated to decrease depression and anxiety and reports benefit of regular sessions in addressing these symptoms.   Interventions: Cognitive Behavioral Therapy Established psychological safety. Checked in with patient regarding current psychosocial stressors and symptoms, overall stable mood and continued anxiety symptoms. Engaged patient in processing current psychosocial stressors, overall decrease in stressors leading to improvement in mood. Talked about patient's health and other issues and her feelings of validation. Discussed social anxiety symptoms and encouraged patient to continue to get out of the house. Also discussed the  benefit of continued support from her family in helping with the home and care taking of the children. Provided support through active listening, validation of feelings, and highlighted patient's strengths.   Diagnosis:   ICD-10-CM   1. Bipolar 2 disorder (HCC)  F31.81     2. Generalized anxiety disorder  F41.1      Plan: Patient's goal is: Help with anxiety and working through things. She reports benefit from having someone to listen to her.   Treatment Target: Increase realistic balanced thinking Explore patient's thoughts, beliefs, automatic thoughts, assumptions Identify unhelpful thinking patterns Process distress and allow for emotional release Questioning and challenging thoughts Cognitive reappraisal   Treatment Target: Increase coping skills Mindfulness practices Deep breathing STOP technique Grounding exercises Treatment Target: Reducing vulnerability to "emotional mind" Values clarification   Self-care - nutrition, sleep, exercise Increase living within values  Teach distress tolerance techniques - "what helps me"   Future Appointments  Date Time Provider Department Center  06/16/2021  1:00 PM [facility] PHARMACIST [facility] None  06/25/2021  2:00 PM Kathreen Cosier, LCSW [facility] None  07/14/2021  2:00 PM Sherren Kerns, PT [facility] None  07/21/2021  2:00 PM Sherren Kerns, PT [facility] None  07/28/2021  2:00 PM Sherren Kerns, PT [facility] None  08/04/2021  2:00 PM Sherren Kerns, PT [facility] None  08/11/2021  2:00 PM Sherren Kerns, PT [facility] None  08/18/2021  2:00 PM Sherren Kerns, PT [facility] None  08/25/2021  2:00 PM Sherren Kerns, PT [facility] None  09/01/2021  2:00 PM Sherren Kerns, PT [facility] None  09/08/2021  2:00 PM Sherren Kerns, PT [facility] None  09/15/2021  2:00 PM Sherren Kerns, PT [facility] None  09/22/2021  2:00 PM Sherren Kerns, PT [facility] None  09/29/2021  2:00 PM Sherren Kerns, PT [facility]  None  10/06/2021  2:00 PM Sherren Kerns, PT [facility] None    Kathreen Cosier, LCSW

## 2021-06-16 ENCOUNTER — Other Ambulatory Visit: Payer: Self-pay

## 2021-06-18 ENCOUNTER — Ambulatory Visit
Admission: EM | Admit: 2021-06-18 | Discharge: 2021-06-18 | Disposition: A | Discharge status: Home / Self Care | Payer: Medicaid Other | Attending: Emergency Medicine | Admitting: Emergency Medicine

## 2021-06-18 ENCOUNTER — Ambulatory Visit (INDEPENDENT_AMBULATORY_CARE_PROVIDER_SITE_OTHER)
Admit: 2021-06-18 | Discharge: 2021-06-18 | Disposition: A | Discharge status: Home / Self Care | Payer: Medicaid Other | Attending: Emergency Medicine | Admitting: Emergency Medicine

## 2021-06-18 ENCOUNTER — Other Ambulatory Visit: Payer: Self-pay

## 2021-06-18 DIAGNOSIS — R1032 Left lower quadrant pain: Secondary | ICD-10-CM | POA: Insufficient documentation

## 2021-06-18 DIAGNOSIS — Z90710 Acquired absence of both cervix and uterus: Secondary | ICD-10-CM | POA: Diagnosis not present

## 2021-06-18 LAB — COMPREHENSIVE METABOLIC PANEL
ALT: 18 U/L (ref 0–44)
AST: 15 U/L (ref 15–41)
Albumin: 3.9 g/dL (ref 3.5–5.0)
Alkaline Phosphatase: 77 U/L (ref 38–126)
Anion gap: 6 (ref 5–15)
BUN: 10 mg/dL (ref 6–20)
CO2: 29 mmol/L (ref 22–32)
Calcium: 9.2 mg/dL (ref 8.9–10.3)
Chloride: 100 mmol/L (ref 98–111)
Creatinine, Ser: 0.54 mg/dL (ref 0.44–1.00)
GFR, Estimated: >60 mL/min (ref >60)
Glucose, Bld: 100 mg/dL — ABNORMAL HIGH (ref 70–99)
Potassium: 4.3 mmol/L (ref 3.5–5.1)
Sodium: 135 mmol/L (ref 135–145)
Total Bilirubin: 0.3 mg/dL (ref 0.3–1.2)
Total Protein: 7.7 g/dL (ref 6.5–8.1)

## 2021-06-18 MED ORDER — ONDANSETRON 8 MG PO TBDP: 8.0000 mg | ORAL_TABLET | Freq: Three times a day (TID) | ORAL | 0 refills | Status: DC | PRN

## 2021-06-18 MED ORDER — IOHEXOL 300 MG/ML  SOLN
150.0000 mL | Freq: Once | INTRAMUSCULAR | Status: AC | PRN
  Administered 2021-06-18: 100 mL via INTRAVENOUS

## 2021-06-18 MED ORDER — TRAMADOL HCL 50 MG PO TABS: 50.0000 mg | ORAL_TABLET | Freq: Four times a day (QID) | ORAL | 0 refills | Status: DC | PRN

## 2021-06-18 NOTE — Discharge Instructions (Addendum)
Your abdominal pain is not the result of an abscess or any acute intra-abdominal process per your CT.  Your pain most likely is a result of new scar tissue formation as a result of your recent surgery.  Use ibuprofen and gabapentin as needed for mild to moderate pain and use the tramadol as needed for severe pain.  You can take 1 tramadol tablet every 6 hours as needed for pain.  Use the Zofran every 8 hours as needed for nausea.  If your pain continues I recommend following up with your surgeon.

## 2021-06-18 NOTE — ED Triage Notes (Signed)
Patient states that she is having lower left quadrant abdominal pain that woke her up around 3am. States that she did have a hysterectomy around 6 weeks ago. States that pain has been constant since this morning.

## 2021-06-18 NOTE — ED Provider Notes (Signed)
[facility]    CSN: [id number] Arrival date & time: 06/18/21  1242      History   Chief Complaint Chief Complaint  Patient presents with   Abdominal Pain    HPI Janet Rich is a 25 y.o. female.   HPI  26 old female here for evaluation of left lower quadrant abdominal pain.  Patient reports that she developed sudden onset left lower quad abdominal pain at 3 AM this morning.  She reports that it is a 9/10 when she is active and a 6/10 at rest.  This been associated with some nausea and decreased appetite.  She denies fever, vomiting, vaginal bleeding, or diarrhea.  Patient reports that the decreased appetite started last night.  She has not had a bowel movement in over a day.  She is eating and drinking and was able to eat breakfast this morning though she had to force herself.  She does have a history of ovarian cysts and this feels similar.  She had a laparoscopic hysterectomy 6 weeks ago and her last cycle was 1 week before her surgery.  That would make her 3 weeks into her menstrual cycle and typically beyond the follicular phase.  Patient reports that during the surgery, which they performed for endometriosis, it was determined that she had a significant amount of scar tissue in her abdomen and adhesions.  Past Medical History:  Diagnosis Date   Anemia    with periods   Ankle fracture    right   Anxiety    Asthma    Back pain    Bipolar 1 disorder (HCC)    Depression    Bipolar    Endometriosis    GERD (gastroesophageal reflux disease)    Headache    MIGRAINES   IBS (irritable bowel syndrome)    Ovarian cyst    PCOS (polycystic ovarian syndrome)    Pneumonia    age 85   PONV (postoperative nausea and vomiting)    nausea after wisdom teeth extraction   Scoliosis    Vaginal Pap smear, abnormal    bx ok    Patient Active Problem List   Diagnosis Date Noted   Endometriosis determined by laparoscopy 05/08/2021   Myalgia 03/11/2021   Elevated BP  without diagnosis of hypertension 01/08/2021   Dichorionic diamniotic twin pregnancy in third trimester 08/14/2020   Preeclampsia 08/14/2020   Twin pregnancy delivered vaginally 08/14/2020   Preterm labor 06/17/2020   Right trigeminal neuralgia 11/08/2019   Endometriosis 03/27/2019   PCOS (polycystic ovarian syndrome) 03/27/2019   Moderate persistent asthma 03/07/2019   Post-nasal drainage 03/07/2019   Irritable bowel syndrome with diarrhea 03/07/2019   Gastroesophageal reflux disease without esophagitis 03/07/2019   Class 2 obesity due to excess calories without serious comorbidity with body mass index (BMI) of 38.0 to 38.9 in adult 09/02/2018   Bipolar 2 disorder (HCC) 08/12/2018   History of low birth weight 03/11/2018   Vulvar atrophy 03/11/2018   History of migraine headaches 05/01/2015   Anxiety and depression 05/01/2015    Past Surgical History:  Procedure Laterality Date   FRACTURE SURGERY     ankle surgery with impant    HYSTEROSCOPY WITH D & C N/A 09/22/2019   Procedure: DILATATION AND CURETTAGE /HYSTEROSCOPY;  Surgeon: Ward, Elenora Fender, MD;  Location: [facility];  Service: Gynecology;  Laterality: N/A;   LYSIS OF ADHESION N/A 05/08/2021   Procedure: LYSIS OF ADHESION, EXCISION OF ENDOMETRIOSIS;  Surgeon: Christeen Douglas, MD;  Location:  [facility];  Service: Gynecology;  Laterality: N/A;   ROBOTIC ASSISTED DIAGNOSTIC LAPAROSCOPY N/A 02/18/2017   Procedure: ROBOTIC ASSISTED DIAGNOSTIC LAPAROSCOPY,EXCISION AND ABLATION OF ENDOMETRIOSIS,LYSIS OF ADHESIONS;  Surgeon: Olivia Mackie, MD;  Location: [facility];  Service: Gynecology;  Laterality: N/A;   ROBOTIC ASSISTED LAPAROSCOPIC HYSTERECTOMY AND SALPINGECTOMY Bilateral 05/08/2021   Procedure: XI ROBOTIC ASSISTED TOTAL LAPAROSCOPIC HYSTERECTOMY AND SALPINGECTOMY;  Surgeon: Christeen Douglas, MD;  Location: [facility];  Service: Gynecology;  Laterality: Bilateral;   VAGINAL DELIVERY N/A 08/14/2020   Procedure: VAGINAL DELIVERY;  Surgeon:  Feliberto Gottron Ihor Austin, MD;  Location: [facility];  Service: Obstetrics;  Laterality: N/A;   WISDOM TOOTH EXTRACTION  2013    OB History     Gravida  2   Para  2   Term  1   Preterm  1   AB  0   Living  3      SAB  0   IAB  0   Ectopic  0   Multiple  1   Live Births  3            Home Medications    Prior to Admission medications   Medication Sig Start Date End Date Taking? Authorizing Provider  acetaminophen (TYLENOL) 500 MG tablet Take 2 tablets (1,000 mg total) by mouth every 6 (six) hours as needed for moderate pain. 07/11/20  Yes McVey, Prudencio Pair, CNM  albuterol (PROVENTIL HFA;VENTOLIN HFA) 108 (90 Base) MCG/ACT inhaler Inhale 1-2 puffs into the lungs every 6 (six) hours as needed for wheezing or shortness of breath. 11/19/18  Yes Domenick Gong, MD  gabapentin (NEURONTIN) 800 MG tablet Take 1 tablet (800 mg total) by mouth at bedtime for 14 days. Take nightly for 3 days, then up to 14 days as needed 05/09/21 06/18/21 Yes Christeen Douglas, MD  ibuprofen (ADVIL) 200 MG tablet Take 400 mg by mouth every 6 (six) hours as needed for moderate pain.   Yes [provider]  ondansetron (ZOFRAN ODT) 8 MG disintegrating tablet Take 1 tablet (8 mg total) by mouth every 8 (eight) hours as needed for nausea or vomiting. 06/18/21  Yes Becky Augusta, NP  traMADol (ULTRAM) 50 MG tablet Take 1 tablet (50 mg total) by mouth every 6 (six) hours as needed. 06/18/21  Yes Becky Augusta, NP  ALPRAZolam Prudy Feeler) 0.25 MG tablet Take 0.25 mg by mouth at bedtime as needed for anxiety. Patient not taking: Reported on 05/08/2021    [provider]  budesonide-formoterol (SYMBICORT) 80-4.5 MCG/ACT inhaler Inhale 2 puffs into the lungs 2 (two) times daily. 03/11/21   Gabriel Cirri, NP  docusate sodium (COLACE) 100 MG capsule Take 1 capsule (100 mg total) by mouth 2 (two) times daily. To keep stools soft 05/09/21   Christeen Douglas, MD  fluconazole (DIFLUCAN) 150 MG tablet Take 1 tab PO  today and repeat in 72 hours if needed Patient not taking: No sig reported 02/10/21   Shirlee Latch, PA-C  ibuprofen (ADVIL) 600 MG tablet Take 1 tablet (600 mg total) by mouth every 6 (six) hours as needed. 08/16/20   Gustavo Lah, CNM  lamoTRIgine (LAMICTAL) 25 MG tablet Take 25 mg by mouth daily. Patient not taking: Reported on 05/08/2021 08/22/20   [provider]  lurasidone (LATUDA) 20 MG TABS tablet Take 1 tablet (20 mg total) by mouth daily. Patient not taking: Reported on 05/08/2021 04/15/21   Gabriel Cirri, NP  miconazole (MICOTIN) 2 % cream Apply 1 application topically 2 (two)  times daily. Patient not taking: No sig reported 02/10/21   Eusebio Friendly B, PA-C  ondansetron (ZOFRAN ODT) 4 MG disintegrating tablet Take 1 tablet (4 mg total) by mouth every 6 (six) hours as needed for nausea. 05/09/21   Christeen Douglas, MD  propranolol (INDERAL) 10 MG tablet Take 1 tablet (10 mg total) by mouth 2 (two) times daily. Patient not taking: No sig reported 01/07/21   Danelle Berry, PA-C  SUMAtriptan (IMITREX) 25 MG tablet Take 25-50 mg by mouth once at the onset of headache, may repeat dose 1-2 hours if still symptomatic  Max dose in 24 hours is 200 mg 05/08/21   Danelle Berry, PA-C    Family History Family History  Problem Relation Age of Onset   Endometriosis Mother    Bipolar disorder Mother    Diabetes Mother        prediabetes   Breast cancer Paternal Grandmother    Cancer Paternal Grandmother    Depression Paternal Grandmother    Schizophrenia Father    Depression Father    Heart disease Neg Hx    Colon cancer Neg Hx    Ovarian cancer Neg Hx     Social History Social History   Tobacco Use   Smoking status: Former    Types: Cigarettes    Quit date: 12/20/2009    Years since quitting: 11.5   Smokeless tobacco: Never   Tobacco comments:    age 2/14, quit  Vaping Use   Vaping Use: Never used  Substance Use Topics   Alcohol use: Yes    Comment: occasional beer    Drug use: No     Allergies   Amoxicillin, Other, and Latex   Review of Systems Review of Systems  Constitutional:  Positive for appetite change. Negative for activity change and fever.  Gastrointestinal:  Positive for abdominal pain and nausea. Negative for abdominal distention, anal bleeding, blood in stool, constipation, diarrhea and vomiting.  Genitourinary:  Negative for vaginal bleeding.  Skin:  Negative for rash.  Hematological: Negative.   Psychiatric/Behavioral: Negative.      Physical Exam Triage Vital Signs ED Triage Vitals  Enc Vitals Group     BP 06/18/21 1334 (!) 145/83     Pulse Rate 06/18/21 1334 77     Resp 06/18/21 1334 18     Temp 06/18/21 1334 98.5 F (36.9 C)     Temp Source 06/18/21 1334 Oral     SpO2 06/18/21 1334 94 %     Weight 06/18/21 1332 210 lb (95.3 kg)     Height 06/18/21 1332 5\' 1"  (1.549 m)     Head Circumference --      Peak Flow --      Pain Score 06/18/21 1332 9     Pain Loc --      Pain Edu? --      Excl. in GC? --    No data found.  Updated Vital Signs BP (!) 145/83 (BP Location: Left Arm)   Pulse 77   Temp 98.5 F (36.9 C) (Oral)   Resp 18   Ht 5\' 1"  (1.549 m)   Wt 210 lb (95.3 kg)   LMP 04/08/2021   SpO2 94%   BMI 39.68 kg/m   Visual Acuity Right Eye Distance:   Left Eye Distance:   Bilateral Distance:    Right Eye Near:   Left Eye Near:    Bilateral Near:     Physical Exam Vitals and nursing note reviewed.  Constitutional:      General: She is not in acute distress.    Appearance: Normal appearance. She is obese. She is not ill-appearing.  HENT:     Head: Normocephalic and atraumatic.  Cardiovascular:     Rate and Rhythm: Normal rate and regular rhythm.     Pulses: Normal pulses.     Heart sounds: Normal heart sounds. No murmur heard.   No gallop.  Pulmonary:     Effort: Pulmonary effort is normal.     Breath sounds: Normal breath sounds. No wheezing, rhonchi or rales.  Abdominal:     General: Bowel  sounds are normal. There is no distension.     Palpations: Abdomen is soft.     Tenderness: There is abdominal tenderness. There is guarding. There is no rebound.  Skin:    General: Skin is warm and dry.     Capillary Refill: Capillary refill takes less than 2 seconds.     Findings: No erythema or rash.  Neurological:     General: No focal deficit present.     Mental Status: She is alert and oriented to person, place, and time.  Psychiatric:        Mood and Affect: Mood normal.        Behavior: Behavior normal.        Thought Content: Thought content normal.        Judgment: Judgment normal.     UC Treatments / Results  Labs (all labs ordered are listed, but only abnormal results are displayed) Labs Reviewed  COMPREHENSIVE METABOLIC PANEL - Abnormal; Notable for the following components:      Result Value   Glucose, Bld 100 (*)    All other components within normal limits    EKG   Radiology CT ABDOMEN PELVIS W CONTRAST  Result Date: 06/18/2021 CLINICAL DATA:  Left lower quadrant pain. 6 weeks status post hysterectomy. EXAM: CT ABDOMEN AND PELVIS WITH CONTRAST TECHNIQUE: Multidetector CT imaging of the abdomen and pelvis was performed using the standard protocol following bolus administration of intravenous contrast. CONTRAST:  OMNIPAQUE IOHEXOL 300 MG/ML  SOLN COMPARISON:  07/30/2016 FINDINGS: Lower Chest: No acute findings. Hepatobiliary: No hepatic masses identified. Gallbladder is unremarkable. No evidence of biliary ductal dilatation. Pancreas:  No mass or inflammatory changes. Spleen: Within normal limits in size and appearance. Adrenals/Urinary Tract: No masses identified. No evidence of ureteral calculi or hydronephrosis. Unremarkable unopacified urinary bladder. No evidence of ureteral or bladder injury. Stomach/Bowel: No evidence of obstruction, inflammatory process or abnormal fluid collections. Vascular/Lymphatic: No pathologically enlarged lymph nodes. No acute  vascular findings. Reproductive: Prior hysterectomy. No evidence of pelvic hematoma, mass, or free fluid. Other:  None. Musculoskeletal:  No suspicious bone lesions identified. IMPRESSION: Prior hysterectomy. No evidence of complication or other acute findings. Electronically Signed   By: Danae Orleans M.D.   On: 06/18/2021 16:36    Procedures Procedures (including critical care time)  Medications Ordered in UC Medications - No data to display  Initial Impression / Assessment and Plan / UC Course  I have reviewed the triage vital signs and the nursing notes.  Pertinent labs & imaging results that were available during my care of the patient were reviewed by me and considered in my medical decision making (see chart for details).  Patient is a nontoxic-appearing 70 old female here for evaluation of left lower quad abdominal pain that started abruptly at 3 AM this morning and remains present and constant.  Patient reports  that the pain is a 9/10 with activity and a 6/10 at rest.  This is associated with some nausea and mildly decreased appetite.  She has not had any bloating, vomiting, diarrhea.  No blood in her stool.  She is 6-week postoperative from a laparoscopic hysterectomy.  She states that she has been passing gas and having bowel movements.  Her last bowel movement was a day and a half ago.  She has passed gas today.  Patient's physical exam reveals a benign cardiopulmonary exam with clear lung sounds all fields.  Abdomen is soft, nondistended, with positive bowel sounds all 4 quadrants.  Patient has no tenderness with palpation of the right lower quadrant, right upper quadrant, or left upper quadrant.  She does have tenderness with palpation of the left lower quadrant with guarding.  Where she is tender is over where one of her trocar sites is from her laparoscopic surgery.  Patient has no guarding or rebound.  Suspect patient's pain may be secondary to scar tissue formation or adhesions.   Offered patient's several options to include pain control and observation, following up with her surgeon, ER, or attempt a CT scan of her abdomen here.  Patient is electing to have her abdomen scanned here if possible.  Peer to peer was conducted with Dr. Chilton SiGreen from the insurance company for authorization of the CT scan.  Authorization given.  Will order CMP as patient needs to have a BUN and creatinine prior to injection of contrast.  CMP shows normal renal function with a BUN of 10 and creatinine of 0.54.  Transaminases are normal.  CT scan of the abdomen reveals no acute intra-abdominal processes.  Will discharge patient home with a diagnosis of postoperative pain, most likely secondary to scar tissue, and have her follow-up with her surgeon for continued or worsening symptoms.  Patient states that she has pain medication at home and she has been using gabapentin and ibuprofen.  We will send prescription for Zofran to [facility] [place].   Final Clinical Impressions(s) / UC Diagnoses   Final diagnoses:  Left lower quadrant abdominal pain     Discharge Instructions      Your abdominal pain is not the result of an abscess or any acute intra-abdominal process per your CT.  Your pain most likely is a result of new scar tissue formation as a result of your recent surgery.  Use ibuprofen and gabapentin as needed for mild to moderate pain and use the tramadol as needed for severe pain.  You can take 1 tramadol tablet every 6 hours as needed for pain.  Use the Zofran every 8 hours as needed for nausea.  If your pain continues I recommend following up with your surgeon.     ED Prescriptions     Medication Sig Dispense Auth. Provider   ondansetron (ZOFRAN ODT) 8 MG disintegrating tablet Take 1 tablet (8 mg total) by mouth every 8 (eight) hours as needed for nausea or vomiting. 20 tablet Becky Augustayan, Kenadee Gates, NP   traMADol (ULTRAM) 50 MG tablet Take 1 tablet (50 mg total) by mouth every 6  (six) hours as needed. 15 tablet Becky Augustayan, Cortez Steelman, NP      I have reviewed the PDMP during this encounter.   Becky Augustayan, Donicia Druck, NP 06/18/21 [phone]

## 2021-06-18 NOTE — ED Notes (Signed)
Patient has been approved for CPT [phone]. Obtained via [employer] . Valid for 06/18/2021 through 08/17/2021. Authorization # [id number].

## 2021-06-19 DIAGNOSIS — Z419 Encounter for procedure for purposes other than remedying health state, unspecified: Secondary | ICD-10-CM | POA: Diagnosis not present

## 2021-06-21 ENCOUNTER — Other Ambulatory Visit: Payer: Self-pay

## 2021-06-21 DIAGNOSIS — J45909 Unspecified asthma, uncomplicated: Secondary | ICD-10-CM | POA: Insufficient documentation

## 2021-06-21 DIAGNOSIS — Z7951 Long term (current) use of inhaled steroids: Secondary | ICD-10-CM | POA: Insufficient documentation

## 2021-06-21 DIAGNOSIS — R102 Pelvic and perineal pain: Secondary | ICD-10-CM | POA: Diagnosis not present

## 2021-06-21 DIAGNOSIS — G8929 Other chronic pain: Secondary | ICD-10-CM | POA: Insufficient documentation

## 2021-06-21 DIAGNOSIS — Z91040 Latex allergy status: Secondary | ICD-10-CM | POA: Diagnosis not present

## 2021-06-21 DIAGNOSIS — Z87891 Personal history of nicotine dependence: Secondary | ICD-10-CM | POA: Insufficient documentation

## 2021-06-21 LAB — COMPREHENSIVE METABOLIC PANEL
ALT: 17 U/L (ref 0–44)
AST: 14 U/L — ABNORMAL LOW (ref 15–41)
Albumin: 3.8 g/dL (ref 3.5–5.0)
Alkaline Phosphatase: 66 U/L (ref 38–126)
Anion gap: 8 (ref 5–15)
BUN: 10 mg/dL (ref 6–20)
CO2: 23 mmol/L (ref 22–32)
Calcium: 8.9 mg/dL (ref 8.9–10.3)
Chloride: 103 mmol/L (ref 98–111)
Creatinine, Ser: 0.51 mg/dL (ref 0.44–1.00)
GFR, Estimated: >60 mL/min (ref >60)
Glucose, Bld: 104 mg/dL — ABNORMAL HIGH (ref 70–99)
Potassium: 4 mmol/L (ref 3.5–5.1)
Sodium: 134 mmol/L — ABNORMAL LOW (ref 135–145)
Total Bilirubin: 0.4 mg/dL (ref 0.3–1.2)
Total Protein: 7.3 g/dL (ref 6.5–8.1)

## 2021-06-21 LAB — CBC
HCT: 40 % (ref 36.0–46.0)
Hemoglobin: 13.2 g/dL (ref 12.0–15.0)
MCH: 27.7 pg (ref 26.0–34.0)
MCHC: 33 g/dL (ref 30.0–36.0)
MCV: 84 fL (ref 80.0–100.0)
Platelets: 304 10*3/uL (ref 150–400)
RBC: 4.76 MIL/uL (ref 3.87–5.11)
RDW: 14.1 % (ref 11.5–15.5)
WBC: 8.7 10*3/uL (ref 4.0–10.5)
nRBC: 0.2 % (ref 0.0–0.2)

## 2021-06-21 LAB — URINALYSIS, COMPLETE (UACMP) WITH MICROSCOPIC
Bilirubin Urine: NEGATIVE
Glucose, UA: NEGATIVE mg/dL
Hgb urine dipstick: NEGATIVE
Ketones, ur: NEGATIVE mg/dL
Leukocytes,Ua: NEGATIVE
Nitrite: NEGATIVE
Protein, ur: 30 mg/dL — AB
Specific Gravity, Urine: 1.02 (ref 1.005–1.030)
pH: 7.5 (ref 5.0–8.0)

## 2021-06-21 LAB — LIPASE, BLOOD: Lipase: 32 U/L (ref 11–51)

## 2021-06-21 NOTE — ED Triage Notes (Signed)
Patient reports hysterectomy 6 weeks ago. Patient c/o LLQ abdominal pain X 1 week. Patient reports emeses today. Patient seen at urgent care for same and had CT performed. Patient reports pain worsening and going up to left ribs. Patient prescribed tramadol by urgent care. Patient took oxycodone and tramadol with no relief.

## 2021-06-22 ENCOUNTER — Emergency Department
Admission: EM | Admit: 2021-06-22 | Discharge: 2021-06-22 | Disposition: A | Discharge status: Home / Self Care | Payer: Medicaid Other | Attending: Emergency Medicine | Admitting: Emergency Medicine

## 2021-06-22 DIAGNOSIS — G8929 Other chronic pain: Secondary | ICD-10-CM

## 2021-06-22 MED ORDER — OXYCODONE HCL 5 MG PO TABS
5.0000 mg | ORAL_TABLET | Freq: Once | ORAL | Status: AC
  Administered 2021-06-22: 5 mg via ORAL
  Filled 2021-06-22: qty 1

## 2021-06-22 MED ORDER — ONDANSETRON 4 MG PO TBDP
4.0000 mg | ORAL_TABLET | Freq: Once | ORAL | Status: AC
  Administered 2021-06-22: 4 mg via ORAL
  Filled 2021-06-22: qty 1

## 2021-06-22 MED ORDER — OXYCODONE HCL 5 MG PO TABS: 5.0000 mg | ORAL_TABLET | Freq: Three times a day (TID) | ORAL | 0 refills | Status: DC | PRN

## 2021-06-22 MED ORDER — DROPERIDOL 2.5 MG/ML IJ SOLN
2.5000 mg | Freq: Once | INTRAMUSCULAR | Status: AC
  Administered 2021-06-22: 2.5 mg via INTRAMUSCULAR
  Filled 2021-06-22: qty 2

## 2021-06-22 MED ORDER — NAPROXEN 500 MG PO TABS
500.0000 mg | ORAL_TABLET | Freq: Once | ORAL | Status: AC
  Administered 2021-06-22: 500 mg via ORAL
  Filled 2021-06-22: qty 1

## 2021-06-22 MED ORDER — ACETAMINOPHEN 500 MG PO TABS
1000.0000 mg | ORAL_TABLET | Freq: Once | ORAL | Status: AC
  Administered 2021-06-22: 1000 mg via ORAL
  Filled 2021-06-22: qty 2

## 2021-06-22 NOTE — Discharge Instructions (Addendum)
Use Tylenol for pain and fevers.  Up to 1000 mg per dose, up to 4 times per day.  Do not take more than 4000 mg of Tylenol/acetaminophen within 24 hours..  Use naproxen/Aleve for anti-inflammatory pain relief. Use up to 500mg  every 12 hours. Do not take more frequently than this. Do not use other NSAIDs (ibuprofen, Advil) while taking this medication. It is safe to take Tylenol with this.   Use Zofran as needed for nausea and vomiting.  Use oxycodone as needed for more severe/breakthrough pain.  Do not take tramadol or take tramadol while taking oxycodone.  Do not drive or operate machinery while taking oxycodone.  Follow-up with Dr. in the clinic.  If you develop any fevers with your symptoms or uncontrolled pain despite the above measures, please return to the ED.

## 2021-06-22 NOTE — ED Provider Notes (Signed)
[facility] Emergency Department Provider Note ____________________________________________   Event Date/Time   First MD Initiated Contact with Patient 06/22/21 [phone]     (approximate)  I have reviewed the triage vital signs and the nursing notes.  HISTORY  Chief Complaint Abdominal Pain   HPI Janet Rich is a 25 y.o. femalewho presents to the ED for evaluation of abd pain.   Chart review indicates hx obesity, bipolar disorder,  Robotically assisted total laparoscopic hysterectomy on 7/21 due to chronic pelvic pain and endometriosis.  [facility] on [facility]/16.  Reporting 7/10 intensity continued pain at that time. Seen at a local urgent care on 8/31 due to the chronic LLQ and left pelvic pain, had an outpatient CT abdomen/pelvis with contrast that day that I review, without evidence of complication or acute pathology.  Pt presents to the ED for evaluation of acute on chronic left pelvic pain. She reports up to 10/10 intensity burning pain to her left lower quadrant abdomen, radiating upwards towards her left flank.  She reports associated nausea and occasional nonbloody nonbilious emesis.  Denies any vaginal bleeding or discharge that is new.  Denies stool changes and reports chronic constipation, last BM 24 hours ago.  Denies chest pain, shortness of breath, syncope, fever, falls or trauma.  Denies rashes or skin changes.  She reports using tramadol occasionally that she got the urgent care, she reports using ibuprofen at nighttime only.  Denies using medications during the day.  Reports that he feels okay during the day, and the pain is mostly at night.  Past Medical History:  Diagnosis Date   Anemia    with periods   Ankle fracture    right   Anxiety    Asthma    Back pain    Bipolar 1 disorder (HCC)    Depression    Bipolar    Endometriosis    GERD (gastroesophageal reflux disease)    Headache    MIGRAINES   IBS (irritable  bowel syndrome)    Ovarian cyst    PCOS (polycystic ovarian syndrome)    Pneumonia    age 74   PONV (postoperative nausea and vomiting)    nausea after wisdom teeth extraction   Scoliosis    Vaginal Pap smear, abnormal    bx ok    Patient Active Problem List   Diagnosis Date Noted   Endometriosis determined by laparoscopy 05/08/2021   Myalgia 03/11/2021   Elevated BP without diagnosis of hypertension 01/08/2021   Dichorionic diamniotic twin pregnancy in third trimester 08/14/2020   Preeclampsia 08/14/2020   Twin pregnancy delivered vaginally 08/14/2020   Preterm labor 06/17/2020   Right trigeminal neuralgia 11/08/2019   Endometriosis 03/27/2019   PCOS (polycystic ovarian syndrome) 03/27/2019   Moderate persistent asthma 03/07/2019   Post-nasal drainage 03/07/2019   Irritable bowel syndrome with diarrhea 03/07/2019   Gastroesophageal reflux disease without esophagitis 03/07/2019   Class 2 obesity due to excess calories without serious comorbidity with body mass index (BMI) of 38.0 to 38.9 in adult 09/02/2018   Bipolar 2 disorder (HCC) 08/12/2018   History of low birth weight 03/11/2018   Vulvar atrophy 03/11/2018   History of migraine headaches 05/01/2015   Anxiety and depression 05/01/2015    Past Surgical History:  Procedure Laterality Date   FRACTURE SURGERY     ankle surgery with impant    HYSTEROSCOPY WITH D & C N/A 09/22/2019   Procedure: DILATATION AND CURETTAGE /HYSTEROSCOPY;  Surgeon: Ward,  Elenora Fender, MD;  Location: [facility] ORS;  Service: Gynecology;  Laterality: N/A;   LYSIS OF ADHESION N/A 05/08/2021   Procedure: LYSIS OF ADHESION, EXCISION OF ENDOMETRIOSIS;  Surgeon: Christeen Douglas, MD;  Location: [facility] ORS;  Service: Gynecology;  Laterality: N/A;   ROBOTIC ASSISTED DIAGNOSTIC LAPAROSCOPY N/A 02/18/2017   Procedure: ROBOTIC ASSISTED DIAGNOSTIC LAPAROSCOPY,EXCISION AND ABLATION OF ENDOMETRIOSIS,LYSIS OF ADHESIONS;  Surgeon: Olivia Mackie, MD;  Location: [facility] ORS;   Service: Gynecology;  Laterality: N/A;   ROBOTIC ASSISTED LAPAROSCOPIC HYSTERECTOMY AND SALPINGECTOMY Bilateral 05/08/2021   Procedure: XI ROBOTIC ASSISTED TOTAL LAPAROSCOPIC HYSTERECTOMY AND SALPINGECTOMY;  Surgeon: Christeen Douglas, MD;  Location: [facility] ORS;  Service: Gynecology;  Laterality: Bilateral;   VAGINAL DELIVERY N/A 08/14/2020   Procedure: VAGINAL DELIVERY;  Surgeon: Feliberto Gottron Ihor Austin, MD;  Location: [facility] ORS;  Service: Obstetrics;  Laterality: N/A;   WISDOM TOOTH EXTRACTION  2013    Prior to Admission medications   Medication Sig Start Date End Date Taking? Authorizing Provider  oxyCODONE (ROXICODONE) 5 MG immediate release tablet Take 1 tablet (5 mg total) by mouth every 8 (eight) hours as needed for severe pain or breakthrough pain. 06/22/21 06/22/22 Yes Delton Prairie, MD  acetaminophen (TYLENOL) 500 MG tablet Take 2 tablets (1,000 mg total) by mouth every 6 (six) hours as needed for moderate pain. 07/11/20   McVey, Prudencio Pair, CNM  albuterol (PROVENTIL HFA;VENTOLIN HFA) 108 (90 Base) MCG/ACT inhaler Inhale 1-2 puffs into the lungs every 6 (six) hours as needed for wheezing or shortness of breath. 11/19/18   Domenick Gong, MD  ALPRAZolam Prudy Feeler) 0.25 MG tablet Take 0.25 mg by mouth at bedtime as needed for anxiety. Patient not taking: Reported on 05/08/2021    [provider]  budesonide-formoterol (SYMBICORT) 80-4.5 MCG/ACT inhaler Inhale 2 puffs into the lungs 2 (two) times daily. 03/11/21   Gabriel Cirri, NP  docusate sodium (COLACE) 100 MG capsule Take 1 capsule (100 mg total) by mouth 2 (two) times daily. To keep stools soft 05/09/21   Christeen Douglas, MD  fluconazole (DIFLUCAN) 150 MG tablet Take 1 tab PO today and repeat in 72 hours if needed Patient not taking: No sig reported 02/10/21   Eusebio Friendly B, PA-C  gabapentin (NEURONTIN) 800 MG tablet Take 1 tablet (800 mg total) by mouth at bedtime for 14 days. Take nightly for 3 days, then up to 14 days as needed 05/09/21  06/18/21  Christeen Douglas, MD  ibuprofen (ADVIL) 200 MG tablet Take 400 mg by mouth every 6 (six) hours as needed for moderate pain.    [provider]  ibuprofen (ADVIL) 600 MG tablet Take 1 tablet (600 mg total) by mouth every 6 (six) hours as needed. 08/16/20   Gustavo Lah, CNM  lamoTRIgine (LAMICTAL) 25 MG tablet Take 25 mg by mouth daily. Patient not taking: Reported on 05/08/2021 08/22/20   [provider]  lurasidone (LATUDA) 20 MG TABS tablet Take 1 tablet (20 mg total) by mouth daily. Patient not taking: Reported on 05/08/2021 04/15/21   Gabriel Cirri, NP  miconazole (MICOTIN) 2 % cream Apply 1 application topically 2 (two) times daily. Patient not taking: No sig reported 02/10/21   Eusebio Friendly B, PA-C  ondansetron (ZOFRAN ODT) 4 MG disintegrating tablet Take 1 tablet (4 mg total) by mouth every 6 (six) hours as needed for nausea. 05/09/21   Christeen Douglas, MD  ondansetron (ZOFRAN ODT) 8 MG disintegrating tablet Take 1 tablet (8 mg total) by mouth every 8 (eight) hours as needed  for nausea or vomiting. 06/18/21   Becky Augusta, NP  propranolol (INDERAL) 10 MG tablet Take 1 tablet (10 mg total) by mouth 2 (two) times daily. Patient not taking: No sig reported 01/07/21   Danelle Berry, PA-C  SUMAtriptan (IMITREX) 25 MG tablet Take 25-50 mg by mouth once at the onset of headache, may repeat dose 1-2 hours if still symptomatic  Max dose in 24 hours is 200 mg 05/08/21   Danelle Berry, PA-C  traMADol (ULTRAM) 50 MG tablet Take 1 tablet (50 mg total) by mouth every 6 (six) hours as needed. 06/18/21   Becky Augusta, NP    Allergies Amoxicillin, Other, and Latex  Family History  Problem Relation Age of Onset   Endometriosis Mother    Bipolar disorder Mother    Diabetes Mother        prediabetes   Breast cancer Paternal Grandmother    Cancer Paternal Grandmother    Depression Paternal Grandmother    Schizophrenia Father    Depression Father    Heart disease Neg Hx    Colon  cancer Neg Hx    Ovarian cancer Neg Hx     Social History Social History   Tobacco Use   Smoking status: Former    Types: Cigarettes    Quit date: 12/20/2009    Years since quitting: 11.5   Smokeless tobacco: Never   Tobacco comments:    age [age], quit  Vaping Use   Vaping Use: Never used  Substance Use Topics   Alcohol use: Yes    Comment: occasional beer   Drug use: No    Review of Systems  Constitutional: No fever/chills Eyes: No visual changes. ENT: No sore throat. Cardiovascular: Denies chest pain. Respiratory: Denies shortness of breath. Gastrointestinal: Positive for acute on chronic left pelvic pain with associated nausea and nonbloody nonbilious emesis.  No diarrhea.  No constipation. Genitourinary: Negative for dysuria. Musculoskeletal: Negative for back pain. Skin: Negative for rash. Neurological: Negative for headaches, focal weakness or numbness.  ____________________________________________   PHYSICAL EXAM:  VITAL SIGNS: Vitals:   06/21/21 2249 06/22/21 0129  BP: (!) 152/82 (!) 152/92  Pulse: 86 75  Resp: 17 18  Temp: 98.5 F (36.9 C) 98.1 F (36.7 C)  SpO2: 98% 100%    Constitutional: Alert and oriented. Well appearing and in no acute distress.  Obese.  Laying on her right side.  Pleasant and conversational in full sentences. Becomes tearful when I examine her abdomen Eyes: Conjunctivae are normal. PERRL. EOMI. Head: Atraumatic. Nose: No congestion/rhinnorhea. Mouth/Throat: Mucous membranes are moist.  Oropharynx non-erythematous. Neck: No stridor. No cervical spine tenderness to palpation. Cardiovascular: Normal rate, regular rhythm. Grossly normal heart sounds.  Good peripheral circulation. Respiratory: Normal respiratory effort.  No retractions. Lungs CTAB. Gastrointestinal: Soft , nondistended, . No CVA tenderness. Poorly localizing and mild LLQ and left-sided abdominal tenderness without peritoneal features. Upper abdomen and right  side abdomen is benign.  No CVA tenderness, no overlying skin changes or signs of trauma. Musculoskeletal: No lower extremity tenderness nor edema.  No joint effusions. No signs of acute trauma. Neurologic:  Normal speech and language. No gross focal neurologic deficits are appreciated. No gait instability noted. Skin:  Skin is warm, dry and intact. No rash noted. Psychiatric: Mood and affect are normal. Speech and behavior are normal. ____________________________________________   LABS (all labs ordered are listed, but only abnormal results are displayed)  Labs Reviewed  COMPREHENSIVE METABOLIC PANEL - Abnormal; Notable for the following components:  Result Value   Sodium 134 (*)    Glucose, Bld 104 (*)    AST 14 (*)    All other components within normal limits  URINALYSIS, COMPLETE (UACMP) WITH MICROSCOPIC - Abnormal; Notable for the following components:   Protein, ur 30 (*)    Bacteria, UA RARE (*)    All other components within normal limits  LIPASE, BLOOD  CBC   ____________________________________________  12 Lead EKG   ____________________________________________  RADIOLOGY  ED MD interpretation: Outpatient CT abdomen/pelvis from 8/31 reviewed by me.  Official radiology report(s): No results found.  ____________________________________________   PROCEDURES and INTERVENTIONS  Procedure(s) performed (including Critical Care):  Procedures  Medications  acetaminophen (TYLENOL) tablet 1,000 mg (1,000 mg Oral Given 06/22/21 0822)  naproxen (NAPROSYN) tablet 500 mg (500 mg Oral Given 06/22/21 9528)  oxyCODONE (Oxy IR/ROXICODONE) immediate release tablet 5 mg (5 mg Oral Given 06/22/21 4132)  droperidol (INAPSINE) 2.5 MG/ML injection 2.5 mg (2.5 mg Intramuscular Given 06/22/21 0823)  ondansetron (ZOFRAN-ODT) disintegrating tablet 4 mg (4 mg Oral Given 06/22/21 0823)    ____________________________________________   MDM / ED COURSE   25 year old woman presents to  the ED with acute on chronic left-sided pelvic pain without evidence of acute pathology.  Blood work is reassuring.  No evidence of UTI or ureterolithiasis.  No indications for repeat CT imaging.  We will pursue pain control and anticipate outpatient management thereafter with OB/GYN follow-up.  Clinical Course as of 06/22/21 4401  Wynelle Link Jun 22, 2021  0805 Discussed plan of care with the patient and no current indications for CT imaging of her abdomen considering her rather extensive work-up and benign blood work.  We discussed good control of her pain and possibly of outpatient management thereafter with OB/GYN follow-up.  She is in agreement with plan of care. [DS]    Clinical Course User Index [DS] Delton Prairie, MD    ____________________________________________   FINAL CLINICAL IMPRESSION(S) / ED DIAGNOSES  Final diagnoses:  Chronic pelvic pain in female     ED Discharge Orders          Ordered    oxyCODONE (ROXICODONE) 5 MG immediate release tablet  Every 8 hours PRN        06/22/21 0832             Kalkidan Caudell   Note:  This document was prepared using Dragon voice recognition software and may include unintentional dictation errors.    Delton Prairie, MD 06/22/21 [id number]

## 2021-06-22 NOTE — ED Notes (Signed)
See triage note  States she developed LLQ pain   States recent   Hysterectomy   No fever  Positive n/v

## 2021-06-24 ENCOUNTER — Telehealth: Payer: Self-pay

## 2021-06-24 NOTE — Telephone Encounter (Signed)
Transition Care Management Follow-up Telephone Call Date of discharge and from where: 06/22/2021-[facility] How have you been since you were released from the hospital? Patient stated she is feeling better Any questions or concerns? No  Items Reviewed: Did the pt receive and understand the discharge instructions provided? Yes  Medications obtained and verified? Yes  Other? No  Any new allergies since your discharge? No  Dietary orders reviewed? N/A Do you have support at home? Yes   Home Care and Equipment/Supplies: Were home health services ordered? not applicable If so, what is the name of the agency? N/A  Has the agency set up a time to come to the patient's home? not applicable Were any new equipment or medical supplies ordered?  No What is the name of the medical supply agency? N/A Were you able to get the supplies/equipment? not applicable Do you have any questions related to the use of the equipment or supplies? No  Functional Questionnaire: (I = Independent and D = Dependent) ADLs: I  Bathing/Dressing- I  Meal Prep- I  Eating- I  Maintaining continence- I  Transferring/Ambulation- I  Managing Meds- I  Follow up appointments reviewed:  PCP Hospital f/u appt confirmed? No   Specialist Hospital f/u appt confirmed? No   Are transportation arrangements needed? No  If their condition worsens, is the pt aware to call PCP or go to the Emergency Dept.? Yes Was the patient provided with contact information for the PCP's office or ED? Yes Was to pt encouraged to call back with questions or concerns? Yes

## 2021-06-25 ENCOUNTER — Ambulatory Visit: Payer: Medicaid Other | Admitting: Licensed Clinical Social Worker

## 2021-06-25 DIAGNOSIS — F411 Generalized anxiety disorder: Secondary | ICD-10-CM

## 2021-06-25 DIAGNOSIS — F3181 Bipolar II disorder: Secondary | ICD-10-CM

## 2021-06-25 NOTE — Progress Notes (Signed)
Counselor/Therapist Progress Note  [name], MRN: [id number],    Date: 06/25/2021  Time Spent: 45 minutes    Treatment Type: Psychotherapy  Reported Symptoms:  overall mild anxiety   Mental Status Exam:  Appearance:   Casual     Behavior:  Appropriate and Sharing  Motor:  Normal  Speech/Language:   Normal Rate  Affect:  Appropriate, Congruent, and Full Range  Mood:  euthymic  Thought process:  normal  Thought content:    WNL  Sensory/Perceptual disturbances:    WNL  Orientation:  oriented to person, place, time/date, situation, and day of week  Attention:  Good  Concentration:  Good  Memory:  WNL  Fund of knowledge:   Good  Insight:    Good  Judgment:   Good  Impulse Control:  Good   Risk Assessment: Danger to Self:  No Self-injurious Behavior: No Danger to Others: No Duty to Warn:no Physical Aggression / Violence:No  Access to Firearms a concern: No  Gang Involvement:No   Subjective: Patient was engaged and cooperative throughout the session using time effectively to discuss thoughts and feelings. Patient voices continued motivation for treatment and understanding of mood and anxiety issues. Patient is likely to benefit from future treatment because she remains motivated to manage symptoms and reports benefit of regular sessions in addressing symptoms.   Interventions: Cognitive Behavioral Therapy Established psychological safety. Checked in with patient regarding her week. Engaged patient in processing current psychosocial stressors, overall static symptoms. Taught patient to notice her thoughts and pause to reframe thoughts. Encouraged patient to patient to continue to practice mindfulness and self-care. Provided support through active listening, validation of feelings, and highlighted patient's strengths.   Diagnosis:   ICD-10-CM   1. Bipolar 2 disorder (HCC)  F31.81     2. Generalized anxiety disorder  F41.1      Plan: Patient's goal is: Help  with anxiety and working through things. She reports benefit from having someone to listen to her.   Treatment Target: Increase realistic balanced thinking Explore patient's thoughts, beliefs, automatic thoughts, assumptions Identify unhelpful thinking patterns Process distress and allow for emotional release Questioning and challenging thoughts Cognitive reappraisal   Treatment Target: Increase coping skills Mindfulness practices Deep breathing STOP technique Grounding exercises Treatment Target: Reducing vulnerability to "emotional mind" Values clarification   Self-care - nutrition, sleep, exercise Increase living within values  Teach distress tolerance techniques - "what helps me"   Future Appointments  Date Time Provider Department Center  07/03/2021  1:00 PM [facility] PHARMACIST [facility] None  07/14/2021  2:00 PM Zerita Boers L, PT [facility] None  07/15/2021 11:00 AM Kathreen Cosier, LCSW [facility] None  07/21/2021  2:00 PM Sherren Kerns, PT [facility] None  07/28/2021  2:00 PM Sherren Kerns, PT [facility] None  08/04/2021  2:00 PM Sherren Kerns, PT [facility] None  08/11/2021  2:00 PM Sherren Kerns, PT [facility] None  08/18/2021  2:00 PM Sherren Kerns, PT [facility] None  08/25/2021  2:00 PM Sherren Kerns, PT [facility] None  09/01/2021  2:00 PM Sherren Kerns, PT [facility] None  09/08/2021  2:00 PM Sherren Kerns, PT [facility] None  09/17/2021  2:00 PM Sherren Kerns, PT [facility] None  09/22/2021  2:00 PM Sherren Kerns, PT [facility] None  09/29/2021  2:00 PM Sherren Kerns, PT [facility] None  10/06/2021  2:00 PM Sherren Kerns, PT [facility] None     Kathreen Cosier, LCSW

## 2021-07-03 ENCOUNTER — Ambulatory Visit: Payer: Self-pay

## 2021-07-11 DIAGNOSIS — G8929 Other chronic pain: Secondary | ICD-10-CM | POA: Diagnosis not present

## 2021-07-14 ENCOUNTER — Ambulatory Visit: Payer: Medicaid Other

## 2021-07-15 ENCOUNTER — Ambulatory Visit: Payer: Medicaid Other | Admitting: Licensed Clinical Social Worker

## 2021-07-15 DIAGNOSIS — F3181 Bipolar II disorder: Secondary | ICD-10-CM

## 2021-07-15 DIAGNOSIS — F411 Generalized anxiety disorder: Secondary | ICD-10-CM

## 2021-07-15 NOTE — Progress Notes (Signed)
Counselor/Therapist Progress Note  [name], MRN: [id number],    Date: 07/15/2021  Time Spent: 45 minutes    Treatment Type: Psychotherapy  Reported Symptoms:  increased anxious, anxious thoughts; panic attack   Mental Status Exam:  Appearance:   Casual     Behavior:  Appropriate and Sharing  Motor:  Normal  Speech/Language:   Normal Rate  Affect:  Appropriate, Congruent, and Full Range  Mood:  normal  Thought process:  normal  Thought content:    WNL  Sensory/Perceptual disturbances:    WNL  Orientation:  oriented to person, place, time/date, situation, and day of week  Attention:  Good  Concentration:  Good  Memory:  WNL  Fund of knowledge:   Good  Insight:    Good  Judgment:   Good  Impulse Control:  Good   Risk Assessment: Danger to Self:  No Self-injurious Behavior: No Danger to Others: No Duty to Warn:no Physical Aggression / Violence:No  Access to Firearms a concern: No  Gang Involvement:No   Subjective: Patient was receptive to feedback and intervention from LCSW and actively and effectively participated in session. Patient voices continued motivation for treatment and understanding of mood and anxiety issues and actively uses coping skills. Patient is likely to benefit from future treatment because she remains motivated to decrease symptoms and continues to work with her prescriber for medication management.    Interventions: Cognitive Behavioral Therapy Established psychological safety. Checked in with patient. Assisted patient in processing her thoughts and emotions about current psychosocial stressors, including parenting challenges, finances, managing day to day tasks of her household. Reviewed the importance of balanced thinking and the need for self-care. Provided support through active listening, validation of feelings, and highlighted patient's strengths.   Diagnosis:   ICD-10-CM   1. Bipolar 2 disorder (HCC)  F31.81     2. Generalized  anxiety disorder  F41.1      Plan: Patient's goal is: Help with anxiety and working through things. She reports benefit from having someone to listen to her.   Treatment Target: Increase realistic balanced thinking Explore patient's thoughts, beliefs, automatic thoughts, assumptions Identify unhelpful thinking patterns Process distress and allow for emotional release Questioning and challenging thoughts Cognitive reappraisal   Treatment Target: Increase coping skills Mindfulness practices Deep breathing STOP technique Grounding exercises Treatment Target: Reducing vulnerability to "emotional mind" Values clarification   Self-care - nutrition, sleep, exercise Increase living within values  Teach distress tolerance techniques - "what helps me"   Future Appointments  Date Time Provider Department Center  07/21/2021  2:00 PM Zerita Boers L, PT [facility] None  07/28/2021  2:00 PM Sherren Kerns, PT [facility] None  07/29/2021  4:30 PM Kathreen Cosier, LCSW [facility] None  08/04/2021  2:00 PM Sherren Kerns, PT [facility] None  08/11/2021  2:00 PM Sherren Kerns, PT [facility] None  08/18/2021  2:00 PM Sherren Kerns, PT [facility] None  08/25/2021  2:00 PM Sherren Kerns, PT [facility] None  09/01/2021  2:00 PM Sherren Kerns, PT [facility] None  09/08/2021  2:00 PM Sherren Kerns, PT [facility] None  09/17/2021  2:00 PM Sherren Kerns, PT [facility] None  09/22/2021  2:00 PM Sherren Kerns, PT [facility] None  09/29/2021  2:00 PM Sherren Kerns, PT [facility] None  10/06/2021  2:00 PM Sherren Kerns, PT [facility] None   Kathreen Cosier, LCSW

## 2021-07-19 DIAGNOSIS — Z419 Encounter for procedure for purposes other than remedying health state, unspecified: Secondary | ICD-10-CM | POA: Diagnosis not present

## 2021-07-21 ENCOUNTER — Other Ambulatory Visit: Payer: Self-pay

## 2021-07-21 ENCOUNTER — Ambulatory Visit: Payer: Medicaid Other | Attending: Obstetrics and Gynecology

## 2021-07-21 DIAGNOSIS — N9419 Other specified dyspareunia: Secondary | ICD-10-CM | POA: Insufficient documentation

## 2021-07-21 DIAGNOSIS — M533 Sacrococcygeal disorders, not elsewhere classified: Secondary | ICD-10-CM | POA: Insufficient documentation

## 2021-07-21 DIAGNOSIS — R278 Other lack of coordination: Secondary | ICD-10-CM | POA: Diagnosis not present

## 2021-07-21 DIAGNOSIS — N393 Stress incontinence (female) (male): Secondary | ICD-10-CM | POA: Diagnosis not present

## 2021-07-21 NOTE — Therapy (Signed)
[facility] MAIN [facility] SERVICES [address] [place], [place], [address] Phone: [phone]   Fax:  [phone]  Physical Therapy Evaluation  Patient Details  Name: Janet Rich MRN: [id number] Date of Birth: [birth year] Referring Provider (PT): Dr. Dalbert Garnet   Encounter Date: 07/21/2021   PT End of Session - 07/21/21 1710     Visit Number 1    Number of Visits 10    Date for PT Re-Evaluation 09/29/21    Authorization Type [employer]-awaiting auth    Authorization Time Period --    Authorization - Visit Number --    Authorization - Number of Visits --    Progress Note Due on Visit 10    PT Start Time 1400    PT Stop Time 1458    PT Time Calculation (min) 58 min    Activity Tolerance Patient tolerated treatment well;No increased pain    Behavior During Therapy WFL for tasks assessed/performed             Past Medical History:  Diagnosis Date   Anemia    with periods   Ankle fracture    right   Anxiety    Asthma    Back pain    Bipolar 1 disorder (HCC)    Depression    Bipolar    Endometriosis    GERD (gastroesophageal reflux disease)    Headache    MIGRAINES   IBS (irritable bowel syndrome)    Ovarian cyst    PCOS (polycystic ovarian syndrome)    Pneumonia    age 25   PONV (postoperative nausea and vomiting)    nausea after wisdom teeth extraction   Scoliosis    Vaginal Pap smear, abnormal    bx ok    Past Surgical History:  Procedure Laterality Date   FRACTURE SURGERY     ankle surgery with impant    HYSTEROSCOPY WITH D & C N/A 09/22/2019   Procedure: DILATATION AND CURETTAGE /HYSTEROSCOPY;  Surgeon: Ward, Elenora Fender, MD;  Location: [facility] ORS;  Service: Gynecology;  Laterality: N/A;   LYSIS OF ADHESION N/A 05/08/2021   Procedure: LYSIS OF ADHESION, EXCISION OF ENDOMETRIOSIS;  Surgeon: Christeen Douglas, MD;  Location: [facility] ORS;  Service: Gynecology;  Laterality: N/A;   ROBOTIC ASSISTED DIAGNOSTIC LAPAROSCOPY  N/A 02/18/2017   Procedure: ROBOTIC ASSISTED DIAGNOSTIC LAPAROSCOPY,EXCISION AND ABLATION OF ENDOMETRIOSIS,LYSIS OF ADHESIONS;  Surgeon: Olivia Mackie, MD;  Location: [facility];  Service: Gynecology;  Laterality: N/A;   ROBOTIC ASSISTED LAPAROSCOPIC HYSTERECTOMY AND SALPINGECTOMY Bilateral 05/08/2021   Procedure: XI ROBOTIC ASSISTED TOTAL LAPAROSCOPIC HYSTERECTOMY AND SALPINGECTOMY;  Surgeon: Christeen Douglas, MD;  Location: [facility] ORS;  Service: Gynecology;  Laterality: Bilateral;   VAGINAL DELIVERY N/A 08/14/2020   Procedure: VAGINAL DELIVERY;  Surgeon: Feliberto Gottron Ihor Austin, MD;  Location: [facility] OR[facility];  Service: Obstetrics;  Laterality: N/A;   WISDOM TOOTH EXTRACTION  2013    There were no vitals filed for this visit.    Subjective Assessment - 07/21/21 1412     Subjective Pt had partial hysterectomy on 05/08/21 and just started feeling better last week. Pt was diagnosed with fibromyalgia and will stop gabapentin and start Lyrica soon. Pt reported L back/hip pain incr. with reaching overhead and reaching into washer: at worst: 9/10, 4-5/10 average, and now 2/10. It improves with rest and readjusting position. Pt has been writing more and reading personal development books. Pt reported pain 6/10 with vaginal OB exam s/p surgery and with sexual intercourse. Pt still  experiences SUI with sneezing, 100% of the time. Pt also reports occasional leaking when she's sitting and leans forward and feels like pelvic floor muscles are weak. Pt and husband are living together again. Pt is taking her lamictal again and feels hopeful it will help. Pt will have to take a few weeks off 2/2 insurance gap. pt is working on losing weight by eating healthy.    Pertinent History fibromyalgia, partial hysterectomy, PCOS, endometriosis, Bipolar disorder, hx of preeclampsia (twin pregnancy), IBS, asthma, R trigeminal neuralgia (still occuring), HTN, vulvar atrophy, anxiety, hx of scoliosis (age [age]-did not use brace but has gone to  chiropractor.    How long can you sit comfortably? about two hours    How long can you stand comfortably? approx. 45 minutes    How long can you walk comfortably? She walked for about 1 hour the other day and was ok (2 miles).    Patient Stated Goals "I'd like for sex to not hurt, OBGYN exam to not hurt, and to not leak all the time. I would like to strengthen core to reduce back pain and overall fitness."    Currently in Pain? Yes    Pain Score --   4-5   Pain Location Back    Pain Orientation Left    Pain Descriptors / Indicators Aching    Pain Type Chronic pain    Pain Onset 1 to 4 weeks ago    Pain Frequency Constant    Aggravating Factors  certain positions    Pain Relieving Factors rest and repositioning and motrin                [facility] PT Assessment - 07/21/21 1431       Assessment   Medical Diagnosis Pelvic and perineal pain, Endometriosis, unspecified    Referring Provider (PT) Dr. Dalbert Garnet    Onset Date/Surgical Date 05/22/21   date of referral   Hand Dominance Right    Prior Therapy OPPT for pelvic health before surgery.      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     Pelvic Floor Special Questions - 07/21/21 1438     Are you Pregnant or attempting pregnancy? No    Prior Pregnancies Yes    Number of Pregnancies 2    Number of C-Sections 0  Number of Vaginal Deliveries 2    Currently Sexually Active Yes    Is this Painful Yes    History of sexually transmitted disease Yes    Marinoff Scale pain prevents any attempts at intercourse    Urinary Leakage Yes    How often Every time with sneezing (daily) and intermittent leakage with sitting and leaning forward.    Pad use no pads but changes underwear once a day.    Activities that cause leaking Sneezing    Urinary urgency No    Urinary frequency Approx. 3 times/hour    Fecal incontinence No    Fluid intake Drinks at least 2 bottles of water a day-adds Mio caffeine flavor to water. Drinks Starbucks frap in the morning, and tea in the afternoon.    Caffeine beverages see above.    Falling out feeling (prolapse) No              Pelvic Floor Physical Therapy Evaluation and Assessment  Screenings: Red Flags: no Have you had any night sweats? Incr. Night sweats since hysterectomy-MD is aware. Unexplained weight loss? no Saddle anesthesia? no Unexplained changes in bowel or bladder changes? no   OBJECTIVE  Posture/Observations:  Sitting: pt sits on exam table with L knee under R LE and forward shoulder posture.  Standing: incr. Lx lordosis in standing.     Range of Motion/Flexibilty: no incr. In pain during spine ROM.  Spine: incr. Lx spine lordosis, decr. L trunk rotation Hips: WNL   Strength/MMT:  LE MMT: Knee flex: 4/5 Knee ext: 4/5 Ankle DF: 4/5 Gross hip abd/add: 3+/5         PT Education - 07/21/21 1706     Education Details PT discussed exam findings, POC, frequency and duration. PT discussed the improvements pt has experienced s/p hysterectomy and how  incorporating mindfulness can help reduce pelvic pain by down regulating nervous system-which can lead to incr. tension. PT re-edcuated pt on proper toileting posture and wearing shoes with heel straps to decr. constant toe flexion during gait, which can lead to incr. pelvic floor tension.    Person(s) Educated Patient    Methods Explanation    Comprehension Verbalized understanding              PT Short Term Goals - 07/21/21 1716       PT SHORT TERM GOAL #1   Title Pt will be IND in updated HEP in order to improve pain, flexibility and strength. TARGET DATE FOR ALL STGS: 3 TREATMENTS    Baseline No HEP at this time    Status New      PT SHORT TERM GOAL #2   Title Pt will verbalize biopsychsocial techniques to reduce tension in order to reduce pelvic and back pain, so pt can care for her family.    Baseline Unable to verbalize    Status New      PT SHORT TERM GOAL #3   Title Have pt take FOTO survery and write goals as indicated.    Baseline not completed    Status New               PT Long Term Goals - 07/21/21 1717       PT LONG TERM GOAL #1   Title Pt will demonstrate improve pelvic floor musculature coordination in order to lengthen PFM to reduce pain during OBGYN exam and intercourse. TARGET DATE FOR ALL LTGS: AFTER 9 TREATMENTS    Baseline 6/10 pain during  OBGYN internal exam    Status New      PT LONG TERM GOAL #2   Title Pt will report no change in clothes over the last two weeks, 2/2 urinary leakage when bending forward while seated or while sneezing.    Baseline Need to formally assess during internal exam when pt consents    Status New      PT LONG TERM GOAL #3   Title Pt will demonstrate proper body mechanics in order to not incr in pain during household chores, such as reaching down into washer and reaching overhead.    Baseline 9/10 at worst    Status New      PT LONG TERM GOAL #4   Title Pt will demonstrate proper seated posture in order to decr.  pain while writing books.    Baseline 9/10 at worst    Status New                    Plan - 07/21/21 1712     Clinical Impression Statement Pt is a pleasant 25 y/o presenting to OPPT pelvic health s/p partial hysterectomy on 05/08/21, she was seeing PT prior to surgery for pelvic pain and incontinence. Pt's PMH is significant for the following: partial hysterectomy, PCOS, endometriosis, Bipolar disorder, hx of preeclampsia (twin pregnancy), IBS, asthma, fibromyalgia, R trigeminal neuralgia (still occuring), HTN, vulvar atrophy, anxiety, hx of scoliosis (age [age]-did not use brace but has gone to chiropractor. The following impairments were noted upon exam: gait deviations, postural dysfunction, decr. ROM, decr. strength, pelvic/hip/back pain, impaired balance and incontinence. Pt would benefit from skilled PT to improve the deficits listed above in order to improve QOL and functional mobililty.    Comorbidities PCOS, endometriosis, Bipolar disorder, hx of preeclampsia (twin pregnancy), IBS, asthma, R trigeminal neuralgia (still occuring), HTN, vulvar atrophy, anxiety, hx of scoliosis (age [age]-did not use brace but has gone to chiropractor.    PT Frequency 1x / week    PT Duration Other (comment)   10 weeks   PT Treatment/Interventions ADLs/Self Care Home Management;Biofeedback;Neuromuscular re-education;Balance training;Therapeutic exercise;Therapeutic activities;Functional mobility training;Stair training;Gait training;Patient/family education;Manual techniques;Joint Manipulations;Spinal Manipulations;Dry needling    PT Next Visit Plan review HEP and progress as tolerated. Manual therapy of QL, LD, hip musculature prn. Internal exam. Trigger point release of psoas, assess mobility in prone and treat as indicated.    PT Home Exercise Plan Medbridge:[id number]             Patient will benefit from skilled therapeutic intervention in order to improve the following deficits and  impairments:  Abnormal gait, Decreased coordination, Decreased activity tolerance, Decreased balance, Decreased mobility, Decreased strength, Postural dysfunction, Impaired flexibility, Improper body mechanics, Pain  Visit Diagnosis: Other lack of coordination - Plan: PT plan of care cert/re-cert  Stress incontinence (female) (female) - Plan: PT plan of care cert/re-cert  Dyspareunia due to medical condition in female - Plan: PT plan of care cert/re-cert  Sacrococcygeal disorders, not elsewhere classified - Plan: PT plan of care cert/re-cert     Problem List Patient Active Problem List   Diagnosis Date Noted   Endometriosis determined by laparoscopy 05/08/2021   Myalgia 03/11/2021   Elevated BP without diagnosis of hypertension 01/08/2021   Dichorionic diamniotic twin pregnancy in third trimester 08/14/2020   Preeclampsia 08/14/2020   Twin pregnancy delivered vaginally 08/14/2020   Preterm labor 06/17/2020   Right trigeminal neuralgia 11/08/2019   Endometriosis 03/27/2019   PCOS (polycystic ovarian syndrome) 03/27/2019  Moderate persistent asthma 03/07/2019   Post-nasal drainage 03/07/2019   Irritable bowel syndrome with diarrhea 03/07/2019   Gastroesophageal reflux disease without esophagitis 03/07/2019   Class 2 obesity due to excess calories without serious comorbidity with body mass index (BMI) of 38.0 to 38.9 in adult 09/02/2018   Bipolar 2 disorder (HCC) 08/12/2018   History of low birth weight 03/11/2018   Vulvar atrophy 03/11/2018   History of migraine headaches 05/01/2015   Anxiety and depression 05/01/2015    Hlee Fringer L, PT 07/21/2021, 5:26 PM  [facility] MAIN [facility] SERVICES [address] [place], [place], [address] Phone: [phone]   Fax:  [phone]  Name: Janet Rich MRN: [id number] Date of Birth: [birth year]  Zerita Boers, PT,DPT 07/21/21 5:27 PM Phone: [phone] Fax: [phone]

## 2021-07-24 ENCOUNTER — Ambulatory Visit: Payer: Medicaid Other | Admitting: Licensed Clinical Social Worker

## 2021-07-24 DIAGNOSIS — M94271 Chondromalacia, right ankle and joints of right foot: Secondary | ICD-10-CM | POA: Diagnosis not present

## 2021-07-24 DIAGNOSIS — F411 Generalized anxiety disorder: Secondary | ICD-10-CM

## 2021-07-24 DIAGNOSIS — S92254D Nondisplaced fracture of navicular [scaphoid] of right foot, subsequent encounter for fracture with routine healing: Secondary | ICD-10-CM | POA: Diagnosis not present

## 2021-07-24 DIAGNOSIS — M129 Arthropathy, unspecified: Secondary | ICD-10-CM | POA: Diagnosis not present

## 2021-07-24 DIAGNOSIS — F3181 Bipolar II disorder: Secondary | ICD-10-CM

## 2021-07-24 NOTE — Progress Notes (Signed)
Counselor/Therapist Progress Note  [name], MRN: [id number],    Date: 07/24/2021  Time Spent: 40 minutes   Treatment Type: Psychotherapy  Reported Symptoms:  Anxiety, overwhelm, panic, anxious thoughts  Mental Status Exam:  Appearance:   Casual     Behavior:  Appropriate and Sharing  Motor:  Restlestness  Speech/Language:   Normal Rate  Affect:  Appropriate, Congruent, Depressed, and Tearful  Mood:  overwhelmed  Thought process:  normal  Thought content:    WNL  Sensory/Perceptual disturbances:    WNL  Orientation:  oriented to person, place, time/date, and situation  Attention:  Good  Concentration:  Good  Memory:  WNL  Fund of knowledge:   Good  Insight:    Good  Judgment:   Good  Impulse Control:  Good   Risk Assessment: Danger to Self:  No Self-injurious Behavior: No Danger to Others: No Duty to Warn:no Physical Aggression / Violence:No  Access to Firearms a concern: No  Gang Involvement:No   Subjective: Patient was engaged and cooperative throughout the session using time effectively to discuss anxiety issues. Patient was receptive to feedback and intervention from LCSW. Patient is likely to benefit from future treatment because they remain motivated to decrease mood and anxiety and report benefit of regular sessions.      Interventions: Cognitive Behavioral Therapy and Dialectical Behavioral Therapy Established psychological safety. Checked in with and engaged patient in processing her thoughts and emotions related to significant anxiety symptoms and panic over the last week. Taught patient emotional regulation technique of putting face in large bowl of icy water. Also discussed with patient distress tolerance through walking barefoot. Reminded patient to bring her thoughts back to today/the present moment. Patient was in agreement to try these coping skills to decrease distress. Provided support through active listening, validation of feelings, and  highlighted patient's strengths.     Diagnosis:   ICD-10-CM   1. Bipolar 2 disorder (HCC)  F31.81     2. Generalized anxiety disorder  F41.1      Plan: Patient's goal is: Help with anxiety and working through things. She reports benefit from having someone to listen to her.   Treatment Target: Increase realistic balanced thinking Explore patient's thoughts, beliefs, automatic thoughts, assumptions Identify unhelpful thinking patterns Process distress and allow for emotional release Questioning and challenging thoughts Cognitive reappraisal   Treatment Target: Increase coping skills Mindfulness practices Deep breathing STOP technique Grounding exercises Treatment Target: Reducing vulnerability to "emotional mind" Values clarification   Self-care - nutrition, sleep, exercise Increase living within values  Teach distress tolerance techniques - "what helps me"   Future Appointments  Date Time Provider Department Center  07/28/2021  2:00 PM Zerita Boers L, PT [facility] None  07/30/2021  4:30 PM Kathreen Cosier, LCSW [facility] None  08/04/2021  2:00 PM Sherren Kerns, PT [facility] None  08/11/2021  2:00 PM Sherren Kerns, PT [facility] None  08/18/2021  2:00 PM Sherren Kerns, PT [facility] None  08/25/2021  2:00 PM Sherren Kerns, PT [facility] None  09/01/2021  2:00 PM Sherren Kerns, PT [facility] None  09/08/2021  2:00 PM Sherren Kerns, PT [facility] None  09/17/2021  2:00 PM Sherren Kerns, PT [facility] None  09/22/2021  2:00 PM Sherren Kerns, PT [facility] None  09/29/2021  2:00 PM Sherren Kerns, PT [facility] None  10/06/2021  2:00 PM Sherren Kerns, PT [facility] None    Kathreen Cosier, LCSW

## 2021-07-28 ENCOUNTER — Ambulatory Visit: Payer: Medicaid Other

## 2021-07-29 ENCOUNTER — Ambulatory Visit: Payer: Medicaid Other | Admitting: Licensed Clinical Social Worker

## 2021-07-29 DIAGNOSIS — M25871 Other specified joint disorders, right ankle and foot: Secondary | ICD-10-CM | POA: Diagnosis not present

## 2021-07-29 DIAGNOSIS — S92251A Displaced fracture of navicular [scaphoid] of right foot, initial encounter for closed fracture: Secondary | ICD-10-CM | POA: Diagnosis not present

## 2021-07-29 DIAGNOSIS — M65871 Other synovitis and tenosynovitis, right ankle and foot: Secondary | ICD-10-CM | POA: Diagnosis not present

## 2021-07-29 DIAGNOSIS — S93491D Sprain of other ligament of right ankle, subsequent encounter: Secondary | ICD-10-CM | POA: Diagnosis not present

## 2021-07-30 ENCOUNTER — Ambulatory Visit: Payer: Medicaid Other | Admitting: Licensed Clinical Social Worker

## 2021-07-30 DIAGNOSIS — F3181 Bipolar II disorder: Secondary | ICD-10-CM

## 2021-07-30 DIAGNOSIS — F411 Generalized anxiety disorder: Secondary | ICD-10-CM

## 2021-07-30 NOTE — Progress Notes (Signed)
Counselor/Therapist Progress Note  [name], MRN: [id number],    Date: 07/30/2021  Time Spent: 45 minutes    Treatment Type: Psychotherapy  Reported Symptoms:  Continued anxiety with some decrease, anxious thought  Mental Status Exam:  Appearance:   Casual     Behavior:  Appropriate and Sharing  Motor:  Normal  Speech/Language:   Clear and Coherent and Normal Rate  Affect:  Appropriate, Congruent, and Full Range  Mood:  normal  Thought process:  normal  Thought content:    WNL  Sensory/Perceptual disturbances:    WNL  Orientation:  oriented to person, place, time/date, and situation  Attention:  Good  Concentration:  Good  Memory:  WNL  Fund of knowledge:   Good  Insight:    Good  Judgment:   Good  Impulse Control:  Good   Risk Assessment: Danger to Self:  No Self-injurious Behavior: No Danger to Others: No Duty to Warn:no Physical Aggression / Violence:No  Access to Firearms a concern: No  Gang Involvement:No   Subjective: Patient was engaged and cooperative throughout the session using time effectively to discuss thoughts and feelings. Patient was receptive to feedback and intervention from LCSW. Patient voices continued motivation for treatment and understanding of mood and anxiety issues. Patient is likely to benefit from future treatment because they remain motivated to decrease symptoms and reports benefit of regular sessions.       Interventions: Cognitive Behavioral Therapy Established psychological safety.  Checked in with patient regarding her week. Reviewed previous session regarding anxiety and use of face in icy water. LCSW assisted patient in processing her thoughts and emotions about what she has experienced regarding managing daily household and parenting tasks. LCSW reviewed distress tolerance - intentional breathing exercises. Encouraged patient's continued engagement in both pleasurable and meaningful activities.  Provided support through  active listening, validation of feelings, and highlighted patient's strengths.   Diagnosis:   ICD-10-CM   1. Bipolar 2 disorder (HCC)  F31.81     2. Generalized anxiety disorder  F41.1      Plan: Patient's goal is: Help with anxiety and working through things. She reports benefit from having someone to listen to her.   Treatment Target: Increase realistic balanced thinking Explore patient's thoughts, beliefs, automatic thoughts, assumptions Identify unhelpful thinking patterns Process distress and allow for emotional release Questioning and challenging thoughts Cognitive reappraisal   Treatment Target: Increase coping skills Mindfulness practices Intentional / Deep breathing STOP technique Grounding exercises Treatment Target: Reducing vulnerability to "emotional mind" Values clarification   Self-care - nutrition, sleep, exercise Increase living within values  Teach distress tolerance techniques - "what helps me"   Future Appointments  Date Time Provider Department Center  08/04/2021  2:00 PM Zerita Boers L, PT [facility] None  08/11/2021  2:00 PM Sherren Kerns, PT [facility] None  08/12/2021  3:00 PM Kathreen Cosier, LCSW [facility] None  08/18/2021  2:00 PM Sherren Kerns, PT [facility] None  08/25/2021  2:00 PM Sherren Kerns, PT [facility] None  09/01/2021  2:00 PM Sherren Kerns, PT [facility] None  09/08/2021  2:00 PM Sherren Kerns, PT [facility] None  09/17/2021  2:00 PM Sherren Kerns, PT [facility] None  09/22/2021  2:00 PM Sherren Kerns, PT [facility] None  09/29/2021  2:00 PM Sherren Kerns, PT [facility] None  10/06/2021  2:00 PM Sherren Kerns, PT [facility] None    Kathreen Cosier, LCSW

## 2021-08-04 ENCOUNTER — Other Ambulatory Visit: Payer: Self-pay

## 2021-08-04 ENCOUNTER — Ambulatory Visit: Payer: Medicaid Other

## 2021-08-04 DIAGNOSIS — R278 Other lack of coordination: Secondary | ICD-10-CM

## 2021-08-04 DIAGNOSIS — N393 Stress incontinence (female) (male): Secondary | ICD-10-CM | POA: Diagnosis not present

## 2021-08-04 DIAGNOSIS — M533 Sacrococcygeal disorders, not elsewhere classified: Secondary | ICD-10-CM | POA: Diagnosis not present

## 2021-08-04 DIAGNOSIS — N9419 Other specified dyspareunia: Secondary | ICD-10-CM | POA: Diagnosis not present

## 2021-08-04 NOTE — Therapy (Signed)
[place] [facility] MAIN [facility] SERVICES [address] [place], [place], [address] Phone: [phone]   Fax:  [phone]  Physical Therapy Treatment  Patient Details  Name: Janet Rich MRN: [id number] Date of Birth: [birth year] Referring Provider (PT): Dr. Dalbert Garnet   Encounter Date: 08/04/2021   PT End of Session - 08/04/21 1500     Visit Number 2    Number of Visits 10    Date for PT Re-Evaluation 09/29/21    Authorization Type [employer] 6 PT visits 07/28/21-09/27/21    Authorization - Visit Number 1    Authorization - Number of Visits 6    Progress Note Due on Visit 10    PT Start Time 1402    PT Stop Time 1455    PT Time Calculation (min) 53 min    Activity Tolerance Patient tolerated treatment well;No increased pain    Behavior During Therapy WFL for tasks assessed/performed             Past Medical History:  Diagnosis Date   Anemia    with periods   Ankle fracture    right   Anxiety    Asthma    Back pain    Bipolar 1 disorder (HCC)    Depression    Bipolar    Endometriosis    GERD (gastroesophageal reflux disease)    Headache    MIGRAINES   IBS (irritable bowel syndrome)    Ovarian cyst    PCOS (polycystic ovarian syndrome)    Pneumonia    age 25   PONV (postoperative nausea and vomiting)    nausea after wisdom teeth extraction   Scoliosis    Vaginal Pap smear, abnormal    bx ok    Past Surgical History:  Procedure Laterality Date   FRACTURE SURGERY     ankle surgery with impant    HYSTEROSCOPY WITH D & C N/A 09/22/2019   Procedure: DILATATION AND CURETTAGE /HYSTEROSCOPY;  Surgeon: Ward, Elenora Fender, MD;  Location: [facility];  Service: Gynecology;  Laterality: N/A;   LYSIS OF ADHESION N/A 05/08/2021   Procedure: LYSIS OF ADHESION, EXCISION OF ENDOMETRIOSIS;  Surgeon: Christeen Douglas, MD;  Location: [facility];  Service: Gynecology;  Laterality: N/A;   ROBOTIC ASSISTED DIAGNOSTIC LAPAROSCOPY N/A 02/18/2017    Procedure: ROBOTIC ASSISTED DIAGNOSTIC LAPAROSCOPY,EXCISION AND ABLATION OF ENDOMETRIOSIS,LYSIS OF ADHESIONS;  Surgeon: Olivia Mackie, MD;  Location: [facility];  Service: Gynecology;  Laterality: N/A;   ROBOTIC ASSISTED LAPAROSCOPIC HYSTERECTOMY AND SALPINGECTOMY Bilateral 05/08/2021   Procedure: XI ROBOTIC ASSISTED TOTAL LAPAROSCOPIC HYSTERECTOMY AND SALPINGECTOMY;  Surgeon: Christeen Douglas, MD;  Location: [facility];  Service: Gynecology;  Laterality: Bilateral;   VAGINAL DELIVERY N/A 08/14/2020   Procedure: VAGINAL DELIVERY;  Surgeon: Feliberto Gottron Ihor Austin, MD;  Location: [facility];  Service: Obstetrics;  Laterality: N/A;   WISDOM TOOTH EXTRACTION  2013    There were no vitals filed for this visit.   Subjective Assessment - 08/04/21 1409     Subjective Pt reported she feels tired today and has been taking Lyrica off schedule as it makes her tired. Pt is still having SUI and her libido is very low. She feels about the same since last visit.    Pertinent History fibromyalgia, partial hysterectomy, PCOS, endometriosis, Bipolar disorder, hx of preeclampsia (twin pregnancy), IBS, asthma, R trigeminal neuralgia (still occuring), HTN, vulvar atrophy, anxiety, hx of scoliosis (age [age]-did not use brace but has gone to chiropractor.    How long can you  sit comfortably? about two hours    How long can you stand comfortably? approx. 45 minutes    How long can you walk comfortably? She walked for about 1 hour the other day and was ok (2 miles).    Patient Stated Goals "I'd like for sex to not hurt, OBGYN exam to not hurt, and to not leak all the time. I would like to strengthen core to reduce back pain and overall fitness."    Currently in Pain? Yes    Pain Score 6     Pain Location Back    Pain Orientation Left    Pain Descriptors / Indicators Aching    Pain Type Chronic pain    Pain Radiating Towards B hips    Pain Onset More than a month ago    Pain Frequency Constant    Aggravating Factors   certain positions    Pain Relieving Factors rest, medication and repositing    Multiple Pain Sites Yes    Pain Score 3    Pain Location Shoulder    Pain Orientation Right;Left    Pain Descriptors / Indicators Tightness    Pain Type Chronic pain    Pain Onset More than a month ago    Pain Frequency Intermittent    Aggravating Factors  worse the last week, laying in bed    Pain Relieving Factors nothing really    Pain Score 2    Pain Location Heel    Pain Orientation Left;Right    Pain Descriptors / Indicators Aching    Pain Type Acute pain    Pain Onset Today    Pain Frequency Intermittent    Aggravating Factors  stairs    Pain Relieving Factors injection                               [facility] Adult PT Treatment/Exercise - 08/04/21 1416       Manual Therapy   Manual Therapy Joint mobilization;Soft tissue mobilization    Manual therapy comments Pt reported pain decr. to 0/10 after manual therapy.    Joint Mobilization T6-12 grade 3 PAs, 3x30 sec bouts.    Soft tissue mobilization PT performed STM to B paraspinals and B QL to decr. tension. NMR: pt performed post. pelvic tilts x10 reps and supine angels x15 reps after manual therapy to maintain gains made during PT. PT also performed trigger point release to L inf. diaphragm attachment with pt performing deep breathing x5 reps.            NMR: Access Code: [email or web address] URL: [email or web address] Date: 08/04/2021 Prepared by: Zerita Boers  Exercises Supine Posterior Pelvic Tilt - 1 x daily - 7 x weekly - 1 sets - 3 reps Supine Angels - 1 x daily - 7 x weekly - 1 sets - 10 reps Supine Figure 4 Piriformis Stretch - 2-3 x daily - 7 x weekly - 1 sets - 3 reps - 30 hold Supine Diaphragmatic Breathing - 1 x daily - 7 x weekly - 1 sets - 5 reps  Performed with S for safety. Cues and demo for proper technique.          PT Education - 08/04/21 1459     Education Details PT provided pt with  HEP. PT educated pt on the importance of proper sitting and standing posture to reduce tension, muscle shortening on one side and pain as she likes to sit with  one foot under buttocks. PT reiterated the importance of mindfulness practices to reduce tension and pain.    Person(s) Educated Patient    Methods Explanation;Demonstration;Tactile cues;Verbal cues;Handout    Comprehension Verbalized understanding;Need further instruction;Returned demonstration              PT Short Term Goals - 07/21/21 1716       PT SHORT TERM GOAL #1   Title Pt will be IND in updated HEP in order to improve pain, flexibility and strength. TARGET DATE FOR ALL STGS: 3 TREATMENTS    Baseline No HEP at this time    Status New      PT SHORT TERM GOAL #2   Title Pt will verbalize biopsychsocial techniques to reduce tension in order to reduce pelvic and back pain, so pt can care for her family.    Baseline Unable to verbalize    Status New      PT SHORT TERM GOAL #3   Title Have pt take FOTO survery and write goals as indicated.    Baseline not completed    Status New               PT Long Term Goals - 07/21/21 1717       PT LONG TERM GOAL #1   Title Pt will demonstrate improve pelvic floor musculature coordination in order to lengthen PFM to reduce pain during OBGYN exam and intercourse. TARGET DATE FOR ALL LTGS: AFTER 9 TREATMENTS    Baseline 6/10 pain during OBGYN internal exam    Status New      PT LONG TERM GOAL #2   Title Pt will report no change in clothes over the last two weeks, 2/2 urinary leakage when bending forward while seated or while sneezing.    Baseline Need to formally assess during internal exam when pt consents    Status New      PT LONG TERM GOAL #3   Title Pt will demonstrate proper body mechanics in order to not incr in pain during household chores, such as reaching down into washer and reaching overhead.    Baseline 9/10 at worst    Status New      PT LONG TERM GOAL  #4   Title Pt will demonstrate proper seated posture in order to decr. pain while writing books.    Baseline 9/10 at worst    Status New                   Plan - 08/04/21 1502     Clinical Impression Statement During spine assessment, pt had hypomobility of tx spine, which improved with manual therapy and NMR activities. Pt demonstrated progress as she reported 0/10 pain at end of session after manual therapy and NMR activities. Pt continues to experience decr. flexibility and postural dysfunction which can impact pelvic floor by causing lenthening of one side and shortening of the other side. Pt would continue to benefit from skilled PT to improve pain, incontinence, flexibility and posture during all ADLs.    Comorbidities PCOS, endometriosis, Bipolar disorder, hx of preeclampsia (twin pregnancy), IBS, asthma, R trigeminal neuralgia (still occuring), HTN, vulvar atrophy, anxiety, hx of scoliosis (age [age]-did not use brace but has gone to chiropractor.    PT Frequency 1x / week    PT Duration Other (comment)   10 weeks   PT Treatment/Interventions ADLs/Self Care Home Management;Biofeedback;Neuromuscular re-education;Balance training;Therapeutic exercise;Therapeutic activities;Functional mobility training;Stair training;Gait training;Patient/family education;Manual techniques;Joint Manipulations;Spinal Manipulations;Dry needling  PT Next Visit Plan review HEP and progress as tolerated. Manual therapy of QL, LD, hip musculature prn. Internal exam. Trigger point release of psoas    PT Home Exercise Plan Medbridge:[email or web address]             Patient will benefit from skilled therapeutic intervention in order to improve the following deficits and impairments:  Abnormal gait, Decreased coordination, Decreased activity tolerance, Decreased balance, Decreased mobility, Decreased strength, Postural dysfunction, Impaired flexibility, Improper body mechanics, Pain  Visit Diagnosis: Other lack  of coordination  Stress incontinence (female) (female)  Sacrococcygeal disorders, not elsewhere classified  Dyspareunia due to medical condition in female     Problem List Patient Active Problem List   Diagnosis Date Noted   Endometriosis determined by laparoscopy 05/08/2021   Myalgia 03/11/2021   Elevated BP without diagnosis of hypertension 01/08/2021   Dichorionic diamniotic twin pregnancy in third trimester 08/14/2020   Preeclampsia 08/14/2020   Twin pregnancy delivered vaginally 08/14/2020   Preterm labor 06/17/2020   Right trigeminal neuralgia 11/08/2019   Endometriosis 03/27/2019   PCOS (polycystic ovarian syndrome) 03/27/2019   Moderate persistent asthma 03/07/2019   Post-nasal drainage 03/07/2019   Irritable bowel syndrome with diarrhea 03/07/2019   Gastroesophageal reflux disease without esophagitis 03/07/2019   Class 2 obesity due to excess calories without serious comorbidity with body mass index (BMI) of 38.0 to 38.9 in adult 09/02/2018   Bipolar 2 disorder (HCC) 08/12/2018   History of low birth weight 03/11/2018   Vulvar atrophy 03/11/2018   History of migraine headaches 05/01/2015   Anxiety and depression 05/01/2015    Nasean Zapf L, PT 08/04/2021, 3:05 PM  [place] [facility] MAIN [facility] SERVICES [address] [place], [place], [address] Phone: [phone]   Fax:  [phone]  Name: Janet Rich MRN: [id number] Date of Birth: [birth year]   Zerita Boers, PT,DPT 08/04/21 3:06 PM Phone: [phone] Fax: [phone]

## 2021-08-04 NOTE — Patient Instructions (Addendum)
Access Code: [email or web address] URL: [email or web address] Date: 08/04/2021 Prepared by: Zerita Boers  Exercises Supine Posterior Pelvic Tilt - 1 x daily - 7 x weekly - 1 sets - 3 reps Supine Angels - 1 x daily - 7 x weekly - 1 sets - 10 reps Supine Figure 4 Piriformis Stretch - 2-3 x daily - 7 x weekly - 1 sets - 3 reps - 30 hold Supine Diaphragmatic Breathing - 1 x daily - 7 x weekly - 1 sets - 5 reps

## 2021-08-11 ENCOUNTER — Ambulatory Visit: Payer: Medicaid Other

## 2021-08-11 ENCOUNTER — Other Ambulatory Visit: Payer: Self-pay

## 2021-08-11 DIAGNOSIS — N9419 Other specified dyspareunia: Secondary | ICD-10-CM | POA: Diagnosis not present

## 2021-08-11 DIAGNOSIS — M533 Sacrococcygeal disorders, not elsewhere classified: Secondary | ICD-10-CM

## 2021-08-11 DIAGNOSIS — N393 Stress incontinence (female) (male): Secondary | ICD-10-CM | POA: Diagnosis not present

## 2021-08-11 DIAGNOSIS — R278 Other lack of coordination: Secondary | ICD-10-CM

## 2021-08-11 DIAGNOSIS — M25571 Pain in right ankle and joints of right foot: Secondary | ICD-10-CM | POA: Diagnosis not present

## 2021-08-11 NOTE — Therapy (Signed)
[place] [facility] MAIN [facility] SERVICES [address] [place], [place], [address] Phone: [phone]   Fax:  [phone]  Physical Therapy Treatment  Patient Details  Name: Janet Rich MRN: [id number] Date of Birth: 1996-05-17 Referring Provider (PT): Dr. Dalbert Garnet   Encounter Date: 08/11/2021   PT End of Session - 08/11/21 1440     Visit Number 3    Number of Visits 10    Date for PT Re-Evaluation 09/29/21    Authorization Type [employer] 6 PT visits 07/28/21-09/27/21    Authorization Time Period 07/28/21-12-10/22    Authorization - Visit Number 2    Authorization - Number of Visits 6    Progress Note Due on Visit 10    PT Start Time 1336   pt arrived early   PT Stop Time 1434    PT Time Calculation (min) 58 min    Activity Tolerance Patient tolerated treatment well;No increased pain    Behavior During Therapy WFL for tasks assessed/performed             Past Medical History:  Diagnosis Date   Anemia    with periods   Ankle fracture    right   Anxiety    Asthma    Back pain    Bipolar 1 disorder (HCC)    Depression    Bipolar    Endometriosis    GERD (gastroesophageal reflux disease)    Headache    MIGRAINES   IBS (irritable bowel syndrome)    Ovarian cyst    PCOS (polycystic ovarian syndrome)    Pneumonia    age 25   PONV (postoperative nausea and vomiting)    nausea after wisdom teeth extraction   Scoliosis    Vaginal Pap smear, abnormal    bx ok    Past Surgical History:  Procedure Laterality Date   FRACTURE SURGERY     ankle surgery with impant    HYSTEROSCOPY WITH D & C N/A 09/22/2019   Procedure: DILATATION AND CURETTAGE /HYSTEROSCOPY;  Surgeon: Ward, Elenora Fender, MD;  Location: [facility];  Service: Gynecology;  Laterality: N/A;   LYSIS OF ADHESION N/A 05/08/2021   Procedure: LYSIS OF ADHESION, EXCISION OF ENDOMETRIOSIS;  Surgeon: Christeen Douglas, MD;  Location: [facility];  Service: Gynecology;  Laterality:  N/A;   ROBOTIC ASSISTED DIAGNOSTIC LAPAROSCOPY N/A 02/18/2017   Procedure: ROBOTIC ASSISTED DIAGNOSTIC LAPAROSCOPY,EXCISION AND ABLATION OF ENDOMETRIOSIS,LYSIS OF ADHESIONS;  Surgeon: Olivia Mackie, MD;  Location: [facility];  Service: Gynecology;  Laterality: N/A;   ROBOTIC ASSISTED LAPAROSCOPIC HYSTERECTOMY AND SALPINGECTOMY Bilateral 05/08/2021   Procedure: XI ROBOTIC ASSISTED TOTAL LAPAROSCOPIC HYSTERECTOMY AND SALPINGECTOMY;  Surgeon: Christeen Douglas, MD;  Location: [facility];  Service: Gynecology;  Laterality: Bilateral;   VAGINAL DELIVERY N/A 08/14/2020   Procedure: VAGINAL DELIVERY;  Surgeon: Feliberto Gottron Ihor Austin, MD;  Location: [facility];  Service: Obstetrics;  Laterality: N/A;   WISDOM TOOTH EXTRACTION  2013    There were no vitals filed for this visit.   Subjective Assessment - 08/11/21 1340     Subjective Pt amb. slowly back to exam room 2/2 R ankle pain as she had x-ray guided injections in R ankle this morning. She states she is feeling anxious as this is her last visit before she no longer has insurance until January, as she can not affored to pay out-of-pocket.    Pertinent History fibromyalgia, partial hysterectomy, PCOS, endometriosis, Bipolar disorder, hx of preeclampsia (twin pregnancy), IBS, asthma, R trigeminal neuralgia (still occuring),  HTN, vulvar atrophy, anxiety, hx of scoliosis (age [age]-did not use brace but has gone to chiropractor.    How long can you sit comfortably? about two hours    How long can you stand comfortably? approx. 45 minutes    How long can you walk comfortably? She walked for about 1 hour the other day and was ok (2 miles).    Patient Stated Goals "I'd like for sex to not hurt, OBGYN exam to not hurt, and to not leak all the time. I would like to strengthen core to reduce back pain and overall fitness."    Currently in Pain? Yes    Pain Score 8     Pain Location Ankle    Pain Orientation Right    Pain Descriptors / Indicators Aching;Other (Comment)    itchy   Pain Type Acute pain    Pain Onset Today    Pain Frequency Constant    Aggravating Factors  standing, walking-anything weight bearing    Pain Relieving Factors rest, non-bearing                    NMR: Access Code: [email or web address] URL: [email or web address] Date: 08/11/2021 Prepared by: Zerita Boers  Exercises Supine Posterior Pelvic Tilt - 1 x daily - 7 x weekly - 1 sets - 3 reps Supine Angels - 1 x daily - 7 x weekly - 1 sets - 10 reps Supine Figure 4 Piriformis Stretch - 2-3 x daily - 7 x weekly - 1 sets - 3 reps - 30 hold Supine Diaphragmatic Breathing - 1 x daily - 7 x weekly - 1 sets - 5 reps Quadruped Pelvic Tilt - 1 x daily - 7 x weekly - 1 sets - 10 reps Supine Pelvic Floor Contraction - 1 x daily - 7 x weekly - 1 sets - 10 reps Supine Hip Adductor Stretch - 2-3 x daily - 7 x weekly - 1 sets - 3 reps - 30 hold  Performed with S for safety. Cues and demo for proper technique.            [facility] Adult PT Treatment/Exercise - 08/11/21 1439       Manual Therapy   Manual Therapy Soft tissue mobilization    Manual therapy comments Pt reported less tension after manual therapy.    Soft tissue mobilization PT performed STM to B hip add. with trigger point release on R side to reduce tension in BLEs and pelvic floor.                     PT Education - 08/11/21 1440     Education Details PT educated pt on progression of HEP during hold, when she does not have insurance. Pt will call back in November to schedule additional appt's for January. PT educated pt on how to use heat and ice for pain.    Person(s) Educated Patient    Methods Explanation;Demonstration;Tactile cues;Verbal cues;Handout    Comprehension Verbalized understanding;Returned demonstration;Need further instruction              PT Short Term Goals - 07/21/21 1716       PT SHORT TERM GOAL #1   Title Pt will be IND in updated HEP in order to improve pain,  flexibility and strength. TARGET DATE FOR ALL STGS: 3 TREATMENTS    Baseline No HEP at this time    Status New      PT SHORT TERM GOAL #2  Title Pt will verbalize biopsychsocial techniques to reduce tension in order to reduce pelvic and back pain, so pt can care for her family.    Baseline Unable to verbalize    Status New      PT SHORT TERM GOAL #3   Title Have pt take FOTO survery and write goals as indicated.    Baseline not completed    Status New               PT Long Term Goals - 07/21/21 1717       PT LONG TERM GOAL #1   Title Pt will demonstrate improve pelvic floor musculature coordination in order to lengthen PFM to reduce pain during OBGYN exam and intercourse. TARGET DATE FOR ALL LTGS: AFTER 9 TREATMENTS    Baseline 6/10 pain during OBGYN internal exam    Status New      PT LONG TERM GOAL #2   Title Pt will report no change in clothes over the last two weeks, 2/2 urinary leakage when bending forward while seated or while sneezing.    Baseline Need to formally assess during internal exam when pt consents    Status New      PT LONG TERM GOAL #3   Title Pt will demonstrate proper body mechanics in order to not incr in pain during household chores, such as reaching down into washer and reaching overhead.    Baseline 9/10 at worst    Status New      PT LONG TERM GOAL #4   Title Pt will demonstrate proper seated posture in order to decr. pain while writing books.    Baseline 9/10 at worst    Status New                   Plan - 08/11/21 1441     Clinical Impression Statement Today's skilled session focused on providing pt with progression for HEP, as she loses insurance this month and cannot afford to pay for PT out-of-pocket. Pt required cues to properly coordinate breath with pelvic floor contraction in order to reduce SUI. Pt demonstrated progress, as she denied pain with intercourse or back pain today. Pt's gait was noted to be antalgic upone amb.  back to exam room 2/2 pt receiving x-ray guided injections today. Pt's B adductors were noted to have trigger points and TTP, which decr. with manual therapy. Pt would continue to benefit from skilled PT to improve posture, pain, incontience, and strength, therefore, PT will place pt on hold until she has new insurance in January.    Comorbidities PCOS, endometriosis, Bipolar disorder, hx of preeclampsia (twin pregnancy), IBS, asthma, R trigeminal neuralgia (still occuring), HTN, vulvar atrophy, anxiety, hx of scoliosis (age [age]-did not use brace but has gone to chiropractor.    PT Frequency 1x / week    PT Duration Other (comment)   10 weeks   PT Treatment/Interventions ADLs/Self Care Home Management;Biofeedback;Neuromuscular re-education;Balance training;Therapeutic exercise;Therapeutic activities;Functional mobility training;Stair training;Gait training;Patient/family education;Manual techniques;Joint Manipulations;Spinal Manipulations;Dry needling    PT Next Visit Plan Hold until 10/2021. review HEP and progress as Htolerated. Manual therapy of QL, LD, hip musculature prn. Internal exam. Trigger point release of psoas    PT Home Exercise Plan Medbridge:[email or web address]    Consulted and Agree with Plan of Care Patient             Patient will benefit from skilled therapeutic intervention in order to improve the following deficits and impairments:  Abnormal gait,  Decreased coordination, Decreased activity tolerance, Decreased balance, Decreased mobility, Decreased strength, Postural dysfunction, Impaired flexibility, Improper body mechanics, Pain  Visit Diagnosis: Other lack of coordination  Stress incontinence (female) (female)  Sacrococcygeal disorders, not elsewhere classified  Dyspareunia due to medical condition in female     Problem List Patient Active Problem List   Diagnosis Date Noted   Endometriosis determined by laparoscopy 05/08/2021   Myalgia 03/11/2021   Elevated BP without  diagnosis of hypertension 01/08/2021   Dichorionic diamniotic twin pregnancy in third trimester 08/14/2020   Preeclampsia 08/14/2020   Twin pregnancy delivered vaginally 08/14/2020   Preterm labor 06/17/2020   Right trigeminal neuralgia 11/08/2019   Endometriosis 03/27/2019   PCOS (polycystic ovarian syndrome) 03/27/2019   Moderate persistent asthma 03/07/2019   Post-nasal drainage 03/07/2019   Irritable bowel syndrome with diarrhea 03/07/2019   Gastroesophageal reflux disease without esophagitis 03/07/2019   Class 2 obesity due to excess calories without serious comorbidity with body mass index (BMI) of 38.0 to 38.9 in adult 09/02/2018   Bipolar 2 disorder (HCC) 08/12/2018   History of low birth weight 03/11/2018   Vulvar atrophy 03/11/2018   History of migraine headaches 05/01/2015   Anxiety and depression 05/01/2015    Kaetlin Bullen L, PT 08/11/2021, 2:45 PM  [place] [facility] MAIN [facility] SERVICES [address] [place], [place], [address] Phone: [phone]   Fax:  [phone]  Name: Janet Rich MRN: [id number] Date of Birth: Nov 26, 1995   Zerita Boers, PT,DPT 08/11/21 2:46 PM Phone: [phone] Fax: [phone]

## 2021-08-11 NOTE — Patient Instructions (Signed)
Access Code: [email or web address] URL: [email or web address] Date: 08/11/2021 Prepared by: Zerita Boers  Exercises Supine Posterior Pelvic Tilt - 1 x daily - 7 x weekly - 1 sets - 3 reps Supine Angels - 1 x daily - 7 x weekly - 1 sets - 10 reps Supine Figure 4 Piriformis Stretch - 2-3 x daily - 7 x weekly - 1 sets - 3 reps - 30 hold Supine Diaphragmatic Breathing - 1 x daily - 7 x weekly - 1 sets - 5 reps Quadruped Pelvic Tilt - 1 x daily - 7 x weekly - 1 sets - 10 reps Supine Pelvic Floor Contraction - 1 x daily - 7 x weekly - 1 sets - 10 reps Supine Hip Adductor Stretch - 2-3 x daily - 7 x weekly - 1 sets - 3 reps - 30 hold

## 2021-08-12 ENCOUNTER — Ambulatory Visit: Payer: Medicaid Other | Admitting: Licensed Clinical Social Worker

## 2021-08-12 DIAGNOSIS — F3181 Bipolar II disorder: Secondary | ICD-10-CM

## 2021-08-12 DIAGNOSIS — F411 Generalized anxiety disorder: Secondary | ICD-10-CM

## 2021-08-12 NOTE — Progress Notes (Signed)
Counselor/Therapist Progress Note  [name], MRN: [id number],    Date: 08/12/2021  Time Spent: 49 minutes    Treatment Type: Psychotherapy  Reported Symptoms: Obsessive thinking, Sleep disturbance, and anxiety, anxious thoughts  (GAD-7 =16)  Mental Status Exam:  Appearance:   Casual     Behavior:  Appropriate and Sharing  Motor:  Normal  Speech/Language:   Clear and Coherent and Normal Rate  Affect:  Appropriate, Congruent, and Full Range  Mood:  dysthymic  Thought process:  normal  Thought content:    WNL  Sensory/Perceptual disturbances:    WNL  Orientation:  oriented to person, place, time/date, and situation  Attention:  Good  Concentration:  Good  Memory:  WNL  Fund of knowledge:   Good  Insight:    Good  Judgment:   Good  Impulse Control:  Good   Risk Assessment: Danger to Self:  No Self-injurious Behavior: No Danger to Others: No Duty to Warn:no Physical Aggression / Violence:No  Access to Firearms a concern: No  Gang Involvement:No   Subjective: Patient was receptive to feedback and intervention from LCSW and actively and effectively participated throughout the session. Patient voices continued motivation for treatment and understanding of mental health issues. Patient is likely to benefit from future treatment because she remains motivated to decrease symptoms and reports benefit of the combination of medication management and regular talk-therapy sessions in addressing symptoms.   Interventions: Cognitive Behavioral Therapy and Person Centered Therapy Established psychological safety. Checked in with patient regarding their week. LCSW assisted patient in processing their thoughts and emotions regarding challenges with managing daily talks- running the house hold and parenting. Provided supportive space for patient to share newly implemented activities and plans that will better help patient to live within her values. LCSW provided support through  active listening, validation of feelings, and highlighted patient's strengths.   Diagnosis:   ICD-10-CM   1. Bipolar 2 disorder (HCC)  F31.81     2. Generalized anxiety disorder  F41.1      Plan: Patient's goal is: Help with anxiety and working through things. She reports benefit from having someone to listen to her.   Treatment Target: Increase realistic balanced thinking Explore patient's thoughts, beliefs, automatic thoughts, assumptions Identify unhelpful thinking patterns Process distress and allow for emotional release Questioning and challenging thoughts Cognitive reappraisal   Treatment Target: Increase coping skills Mindfulness practices Intentional / Deep breathing STOP technique Grounding exercises Treatment Target: Reducing vulnerability to "emotional mind" Values clarification   Self-care - nutrition, sleep, exercise Increase living within values  Teach distress tolerance techniques - "what helps me"   Future Appointments  Date Time Provider Department Center  08/18/2021  2:00 PM Zerita Boers L, PT [facility] None  08/25/2021  2:00 PM Sherren Kerns, PT [facility] None  08/26/2021  2:30 PM Kathreen Cosier, LCSW [facility] None  09/01/2021  2:00 PM Sherren Kerns, PT [facility] None  09/08/2021  2:00 PM Sherren Kerns, PT [facility] None  09/17/2021  2:00 PM Sherren Kerns, PT [facility] None  09/22/2021  2:00 PM Sherren Kerns, PT [facility] None  09/29/2021  2:00 PM Sherren Kerns, PT [facility] None  10/06/2021  2:00 PM Sherren Kerns, PT [facility] None   Kathreen Cosier, LCSW

## 2021-08-13 ENCOUNTER — Telehealth: Payer: Self-pay

## 2021-08-13 NOTE — Telephone Encounter (Signed)
Copied from CRM [id number]. Topic: General - Other >> Aug 12, 2021  4:30 PM Janet Rich A wrote: Reason for CRM: The patient has called for an update on the prior authorization of their prescription for lurasidone (LATUDA) 20 MG TABS tablet [[id number]]   The patient's pharmacy directed the patient to contact their PCP for further assistance with the prescription   Please contact to further advise

## 2021-08-13 NOTE — Telephone Encounter (Signed)
Called pt back and notified a prior Berkley Harvey has been put in.

## 2021-08-18 ENCOUNTER — Ambulatory Visit: Payer: Medicaid Other

## 2021-08-18 ENCOUNTER — Ambulatory Visit: Payer: Medicaid Other | Admitting: Licensed Clinical Social Worker

## 2021-08-18 DIAGNOSIS — F411 Generalized anxiety disorder: Secondary | ICD-10-CM

## 2021-08-18 DIAGNOSIS — F3181 Bipolar II disorder: Secondary | ICD-10-CM

## 2021-08-18 NOTE — Progress Notes (Signed)
Counselor/Therapist Progress Note  [name], MRN: [id number],    Date: 08/18/2021  Time Spent: 52 minutes    Treatment Type: Psychotherapy  Reported Symptoms: Obsessive thinking, Anhedonia, and anxiety, overwhelm; depressed mood, sadness  Mental Status Exam:  Appearance:   Casual     Behavior:  Appropriate and Sharing  Motor:  Normal  Speech/Language:   Normal Rate  Affect:  Appropriate, Congruent, Depressed, Full Range, and Tearful  Mood:  depressed  Thought process:  normal  Thought content:    WNL  Sensory/Perceptual disturbances:    WNL  Orientation:  oriented to person, place, time/date, situation, and day of week  Attention:  Good  Concentration:  Good  Memory:  WNL  Fund of knowledge:   Good  Insight:    Good  Judgment:   Good  Impulse Control:  Good   Risk Assessment: Danger to Self:  No Self-injurious Behavior: No Danger to Others: No Duty to Warn:no Physical Aggression / Violence:No  Access to Firearms a concern: No  Gang Involvement:No   Subjective: Patient was engaged and cooperative throughout the session using time effectively to discuss thoughts, feelings, and coping skills. Patient reports using coping skills, including Breathing, progressive muscle relaxation, Cognitive behavioral therapy app. Patient is likely to benefit from future treatment because she remains motivated to decrease symptoms of anxiety and depressed mood and reports benefit of regular sessions in addressing these symptoms. Patient may benefit from another medication management eval and med adjustment.    Interventions: Cognitive Behavioral Therapy Checked in with patient. LCSW assisted patient in processing their thoughts and emotions about what they are experiencing related to mental health symptoms and managing daily household and parenting responsibilities.  LCSW reviewed mindfulness practices such as noticing without judgement and being curious to increase distress  tolerance.  LCSW provided information for [facility] and will follow up with patient regarding any resources for medication management. Provided support through active listening, validation of feelings, and highlighted patient's strengths.   Diagnosis:   ICD-10-CM   1. Bipolar 2 disorder (HCC)  F31.81     2. Generalized anxiety disorder  F41.1      Plan: Patient's goal is: Help with anxiety and working through things. She reports benefit from having someone to listen to her.   Treatment Target: Increase realistic balanced thinking Explore patient's thoughts, beliefs, automatic thoughts, assumptions Identify unhelpful thinking patterns Process distress and allow for emotional release Questioning and challenging thoughts Cognitive reappraisal   Treatment Target: Increase coping skills Mindfulness practices Intentional / Deep breathing STOP technique Grounding exercises Treatment Target: Reducing vulnerability to "emotional mind" Values clarification   Self-care - nutrition, sleep, exercise Increase living within values  Teach distress tolerance techniques - "what helps me"   Future Appointments  Date Time Provider Department Center  08/25/2021  2:00 PM Sherren Kerns, PT [facility] None  08/26/2021  2:30 PM Kathreen Cosier, LCSW [facility] None  09/01/2021  2:00 PM Sherren Kerns, PT [facility] None  09/08/2021  2:00 PM Sherren Kerns, PT [facility] None  09/17/2021  2:00 PM Sherren Kerns, PT [facility] None  09/22/2021  2:00 PM Sherren Kerns, PT [facility] None  09/29/2021  2:00 PM Sherren Kerns, PT [facility] None  10/06/2021  2:00 PM Sherren Kerns, PT [facility] None     Kathreen Cosier, LCSW

## 2021-08-19 DIAGNOSIS — Z419 Encounter for procedure for purposes other than remedying health state, unspecified: Secondary | ICD-10-CM | POA: Diagnosis not present

## 2021-08-25 ENCOUNTER — Ambulatory Visit: Payer: Medicaid Other

## 2021-08-26 ENCOUNTER — Ambulatory Visit: Payer: Medicaid Other | Admitting: Licensed Clinical Social Worker

## 2021-08-26 DIAGNOSIS — F3181 Bipolar II disorder: Secondary | ICD-10-CM

## 2021-08-26 DIAGNOSIS — F411 Generalized anxiety disorder: Secondary | ICD-10-CM

## 2021-08-26 NOTE — Progress Notes (Signed)
Counselor/Therapist Progress Note  [name], MRN: [id number],    Date: 08/26/2021  Time Spent: 54 minutes    Treatment Type: Psychotherapy  Reported Symptoms:  Anxiety, anxious thoughts, low energy; low mood, sadness  Mental Status Exam:  Appearance:   Casual     Behavior:  Appropriate and Sharing  Motor:  Normal  Speech/Language:   Clear and Coherent and Normal Rate  Affect:  Appropriate, Congruent, Depressed, Full Range, and Tearful  Mood:  depressed  Thought process:  normal  Thought content:    WNL  Sensory/Perceptual disturbances:    WNL  Orientation:  oriented to person, place, time/date, and situation  Attention:  Good  Concentration:  Good  Memory:  WNL  Fund of knowledge:   Good  Insight:    Good  Judgment:   Good  Impulse Control:  Good   Risk Assessment: Danger to Self:  No Self-injurious Behavior: No Danger to Others: No Duty to Warn:no Physical Aggression / Violence:No  Access to Firearms a concern: No  Gang Involvement:No   Subjective: Patient was engaged and cooperative throughout the session using time effectively to discuss thoughts and feelings. Patient voices continued motivation for treatment and understanding of mood and anxiety issues. Patient is likely to benefit from future treatment because she remains motivated to decrease symptoms and and reports benefit of regular sessions in addressing these symptoms. Patient has appt. With [facility] on 08/28/21.   Interventions: Cognitive Behavioral Therapy Established psychological safety. Checked in with patient regarding their week. Engaged patient in processing current psychosocial stressors, continued depressed mood and anxiety challenges with parenting and running of the household. Explored patient's challenges to identify points of intervention. Encouraged patient to notice accomplishments, and notice "should statements", and encouraged continued self-care morning  routine. Provided support through active listening, validation of feelings, and highlighted patient's strengths.    Diagnosis:   ICD-10-CM   1. Bipolar 2 disorder (HCC)  F31.81     2. Generalized anxiety disorder  F41.1      Plan: Patient's goal is: Help with anxiety and working through things. She reports benefit from having someone to listen to her.   Treatment Target: Increase realistic balanced thinking Explore patient's thoughts, beliefs, automatic thoughts, assumptions Identify unhelpful thinking patterns Process distress and allow for emotional release Questioning and challenging thoughts Cognitive reappraisal   Treatment Target: Increase coping skills Mindfulness practices Intentional / Deep breathing STOP technique Grounding exercises Treatment Target: Reducing vulnerability to "emotional mind" Values clarification   Self-care - nutrition, sleep, exercise Increase living within values  Teach distress tolerance techniques - "what helps me"    Future Appointments  Date Time Provider Department Center  09/02/2021 11:00 AM Kathreen Cosier, LCSW [facility] None    Kathreen Cosier, LCSW

## 2021-08-28 DIAGNOSIS — Z5181 Encounter for therapeutic drug level monitoring: Secondary | ICD-10-CM | POA: Diagnosis not present

## 2021-08-28 DIAGNOSIS — F411 Generalized anxiety disorder: Secondary | ICD-10-CM | POA: Diagnosis not present

## 2021-08-28 DIAGNOSIS — Z79899 Other long term (current) drug therapy: Secondary | ICD-10-CM | POA: Diagnosis not present

## 2021-08-29 DIAGNOSIS — F411 Generalized anxiety disorder: Secondary | ICD-10-CM | POA: Diagnosis not present

## 2021-09-01 ENCOUNTER — Ambulatory Visit: Payer: Medicaid Other

## 2021-09-02 ENCOUNTER — Ambulatory Visit: Payer: Medicaid Other | Admitting: Licensed Clinical Social Worker

## 2021-09-02 DIAGNOSIS — F411 Generalized anxiety disorder: Secondary | ICD-10-CM

## 2021-09-02 DIAGNOSIS — F3181 Bipolar II disorder: Secondary | ICD-10-CM

## 2021-09-02 NOTE — Progress Notes (Signed)
Counselor/Therapist Progress Note  [name], MRN: [id number],    Date: 09/02/2021  Time Spent: 45 minutes    Treatment Type: Psychotherapy  Reported Symptoms:  Anxiety, anxious thoughts, overwhelm; Intermittent depressed mood   Mental Status Exam:  Appearance:   Casual     Behavior:  Appropriate and Sharing  Motor:  Normal  Speech/Language:   Clear and Coherent and Normal Rate  Affect:  Appropriate, Congruent, and Full Range  Mood:  normal  Thought process:  normal  Thought content:    WNL  Sensory/Perceptual disturbances:    WNL  Orientation:  oriented to person, place, time/date, and situation  Attention:  Good  Concentration:  Good  Memory:  WNL  Fund of knowledge:   Good  Insight:    Good  Judgment:   Good  Impulse Control:  Good   Risk Assessment: Danger to Self:  No Self-injurious Behavior: No Danger to Others: No Duty to Warn:no Physical Aggression / Violence:No  Access to Firearms a concern: No  Gang Involvement:No   Subjective: Patient was engaged and cooperative throughout the session using time effectively to discuss thoughts and feelings. Patient was receptive to feedback and intervention from LCSW. Patient is likely to benefit from future treatment because they remain motivated to decrease symptoms and reports benefit of regular sessions. Patient reports benefit from psychiatric eval at [facility].     Interventions: Cognitive Behavioral Therapy and Person Centered Therapy Established psychological safety. Checked in with patient regarding their week. Engaged patient in processing current psychosocial stressors, continued anxiety and mood symptoms due to parenting challenges. Discussed recent psychiatry appointment and medication management with [facility]. Reviewed noticing thought distortions (Catastrophizing) and self-talk, noticing thoughts and emotions without judgement, intentional breathing and grounding  techniques to reduce distress. Provided support through active listening, validation of feelings, and highlighted patient's strengths.    Diagnosis:   ICD-10-CM   1. Bipolar 2 disorder (HCC)  F31.81     2. Generalized anxiety disorder  F41.1      Plan: Patient's goal is: Help with anxiety and working through things. She reports benefit from having someone to listen to her.   Treatment Target: Increase realistic balanced thinking Explore patient's thoughts, beliefs, automatic thoughts, assumptions Identify unhelpful thinking patterns Process distress and allow for emotional release Questioning and challenging thoughts Cognitive reappraisal   Treatment Target: Increase coping skills Mindfulness practices Intentional / Deep breathing STOP technique Grounding exercises Treatment Target: Reducing vulnerability to "emotional mind" Values clarification   Self-care - nutrition, sleep, exercise Increase living within values  Teach distress tolerance techniques - "what helps me"    Future Appointments  Date Time Provider Department Center  09/09/2021  3:00 PM Kathreen Cosier, LCSW [facility] None     Kathreen Cosier, LCSW

## 2021-09-09 ENCOUNTER — Ambulatory Visit: Payer: Medicaid Other | Admitting: Licensed Clinical Social Worker

## 2021-09-09 DIAGNOSIS — F3181 Bipolar II disorder: Secondary | ICD-10-CM

## 2021-09-09 DIAGNOSIS — F411 Generalized anxiety disorder: Secondary | ICD-10-CM

## 2021-09-09 NOTE — Progress Notes (Signed)
Counselor/Therapist Progress Note  [name], MRN: [id number],    Date: 09/09/2021  Time Spent: 47 minutes    Treatment Type: Psychotherapy  Reported Symptoms:  irritability, anxiety, anxious thoughts, sleep disturbance  Mental Status Exam:  Appearance:   Casual     Behavior:  Appropriate and Sharing  Motor:  Normal  Speech/Language:   Clear and Coherent and Normal Rate  Affect:  Appropriate, Congruent, and Full Range  Mood:  normal  Thought process:  normal  Thought content:    WNL  Sensory/Perceptual disturbances:    WNL  Orientation:  oriented to person, place, time/date, and situation  Attention:  Good  Concentration:  Good  Memory:  WNL  Fund of knowledge:   Good  Insight:    Good  Judgment:   Good  Impulse Control:  Good   Risk Assessment: Danger to Self:  No Self-injurious Behavior: No Danger to Others: No Duty to Warn:no Physical Aggression / Violence:No  Access to Firearms a concern: No  Gang Involvement:No   Subjective: Patient was engaged and cooperative throughout the session using time effectively to discuss thoughts and feelings. Patient was receptive to feedback from LCSW. Patient is likely to benefit from future treatment because they remain motivated to decrease mood symptoms and anxiety and improve functioning and she reports benefit of regular sessions.  Patient also reports benefit from medication management adjustments.    Interventions: Cognitive Behavioral Therapy and Client Centered Therapy Established psychological safety. Checked in with patient regarding their week. Engaged patient in processing current psychosocial stressors, continued mood and anxiety issues due to daily challenges with caring for her family. Discussed the importance of engagement with positive peer supports and encouraged patient to continue to do this. Provided support through active listening, validation of feelings, and highlighted patient's strengths.    Diagnosis:   ICD-10-CM   1. Mild bipolar II disorder, most recent episode major depressive (HCC)  F31.81     2. Generalized anxiety disorder  F41.1      Plan: Patient's goal is: Help with anxiety and working through things. She reports benefit from having someone to listen to her.   Treatment Target: Increase realistic balanced thinking Explore patient's thoughts, beliefs, automatic thoughts, assumptions Identify unhelpful thinking patterns Process distress and allow for emotional release Questioning and challenging thoughts Cognitive reappraisal   Treatment Target: Increase coping skills Mindfulness practices Intentional / Deep breathing STOP technique Grounding exercises Treatment Target: Reducing vulnerability to "emotional mind" Values clarification   Self-care - nutrition, sleep, exercise Increase living within values  Teach distress tolerance techniques - "what helps me"   Future Appointments  Date Time Provider Department Center  09/17/2021  2:00 PM Kathreen Cosier, LCSW [facility] None  09/24/2021  2:00 PM Kathreen Cosier, LCSW [facility] None    Kathreen Cosier, LCSW

## 2021-09-15 DIAGNOSIS — M791 Myalgia, unspecified site: Secondary | ICD-10-CM | POA: Diagnosis not present

## 2021-09-15 DIAGNOSIS — R809 Proteinuria, unspecified: Secondary | ICD-10-CM | POA: Diagnosis not present

## 2021-09-15 DIAGNOSIS — R700 Elevated erythrocyte sedimentation rate: Secondary | ICD-10-CM | POA: Diagnosis not present

## 2021-09-17 ENCOUNTER — Ambulatory Visit: Payer: Medicaid Other | Admitting: Licensed Clinical Social Worker

## 2021-09-17 DIAGNOSIS — F3181 Bipolar II disorder: Secondary | ICD-10-CM

## 2021-09-17 DIAGNOSIS — F411 Generalized anxiety disorder: Secondary | ICD-10-CM

## 2021-09-17 NOTE — Progress Notes (Signed)
Counselor/Therapist Progress Note  Patient ID: Zandra Lajeunesse, MRN: [id number],    Date: 09/17/2021  Time Spent: 45 minutes   Treatment Type: Psychotherapy  Reported Symptoms: Physical aches and pain and low mood, anxiety, anxious thoughts  Mental Status Exam:  Appearance:   Casual     Behavior:  Appropriate and Sharing  Motor:  Normal  Speech/Language:   Clear and Coherent and Normal Rate  Affect:  Appropriate, Congruent, and Full Range  Mood:  dysthymic  Thought process:  normal  Thought content:    WNL  Sensory/Perceptual disturbances:    WNL  Orientation:  oriented to person, place, time/date, and situation  Attention:  Good  Concentration:  Good  Memory:  WNL  Fund of knowledge:   Good  Insight:    Good  Judgment:   Good  Impulse Control:  Good   Risk Assessment: Danger to Self:  No Self-injurious Behavior: No Danger to Others: No Duty to Warn:no Physical Aggression / Violence:No  Access to Firearms a concern: No  Gang Involvement:No   Subjective: Patient was engaged and cooperative throughout the session using time effectively to discuss thoughts and feelings. Patient was receptive to feedback and intervention from LCSW. Patient is likely to benefit from future treatment because they remain motivated to decrease mood and anxiety symptoms and reports benefit of regular sessions.        Interventions: Cognitive Behavioral Therapy Established psychological safety. Checked in with patient regarding her week. Provided supportive space encouraging emotional release and processing of current psychosocial stressors, continued mood and anxiety symptoms due to marital challenges and parenting stressors. Explored patient's perception of difficulties, identifying patterns of unhelpful thoughts leading to depressed mood and increased anxiety and reframing thought patterns. Reviewed strategies to identify, accept and manage emotions while also continuing to act consistent with her  values. Provided support through active listening, validation of feelings, and highlighted patient's strengths.    Diagnosis:   ICD-10-CM   1. Generalized anxiety disorder  F41.1     2. Bipolar 2 disorder (HCC)  F31.81      Plan: Patient's goal is: Help with anxiety and working through things. She reports benefit from having someone to listen to her.   Treatment Target: Increase realistic balanced thinking Explore patient's thoughts, beliefs, automatic thoughts, assumptions Identify unhelpful thinking patterns Process distress and allow for emotional release Questioning and challenging thoughts Cognitive reappraisal   Treatment Target: Increase coping skills Mindfulness practices Intentional / Deep breathing STOP technique Grounding exercises Treatment Target: Reducing vulnerability to "emotional mind" Values clarification   Self-care - nutrition, sleep, exercise Increase living within values  Teach distress tolerance techniques - "what helps me"   Future Appointments  Date Time Provider Department Center  09/24/2021  2:00 PM Kathreen Cosier, LCSW [facility] None  09/29/2021 11:00 AM Kathreen Cosier, LCSW [facility] None   Kathreen Cosier, LCSW

## 2021-09-18 DIAGNOSIS — Z419 Encounter for procedure for purposes other than remedying health state, unspecified: Secondary | ICD-10-CM | POA: Diagnosis not present

## 2021-09-24 ENCOUNTER — Ambulatory Visit: Payer: Medicaid Other | Admitting: Licensed Clinical Social Worker

## 2021-09-24 DIAGNOSIS — F3181 Bipolar II disorder: Secondary | ICD-10-CM

## 2021-09-24 DIAGNOSIS — F411 Generalized anxiety disorder: Secondary | ICD-10-CM

## 2021-09-24 NOTE — Progress Notes (Signed)
Counselor/Therapist Progress Note  [name], MRN: [id number],    Date: 09/24/2021  Time Spent: 52 minutes    Treatment Type: Psychotherapy  Reported Symptoms: Fatigue, Physical aches and pain, and low mood, low motivation, mild anxiety  Mental Status Exam:  Appearance:   Casual     Behavior:  Appropriate and Sharing  Motor:  Normal  Speech/Language:   Clear and Coherent  Affect:  Appropriate, Congruent, and Full Range  Mood:  normal  Thought process:  normal  Thought content:    WNL  Sensory/Perceptual disturbances:    WNL  Orientation:  oriented to person, place, time/date, and situation  Attention:  Fair  Concentration:  Fair  Memory:  WNL  Fund of knowledge:   Good  Insight:    Good  Judgment:   Good  Impulse Control:  Good   Risk Assessment: Danger to Self:  No Self-injurious Behavior: No Danger to Others: No Duty to Warn:no Physical Aggression / Violence:No  Access to Firearms a concern: No  Gang Involvement:No   Subjective: Patient was engaged and cooperative throughout the session using time effectively to discuss thoughts and feelings. Patient voices continued motivation for treatment. Patient is likely to benefit from future treatment because they remain motivated to decrease mood and anxiety symptoms and reports benefit of regular sessions.        Interventions: Cognitive Behavioral Therapy Established psychological safety. Checked in with patient regarding her week, symptoms and current psychosocial stressors. Engaged patient in processing current psychosocial stressors, continued low mood and anxiety due to challenges with balancing daily tasks and conflict in values. Validated patient's feelings of sadness and frustration related to motivation issues and physical health challenges leading to difficulties in living within her values. Discussed balanced thinking and focusing on what patient can control. Encouraged patient to discuss symptoms with  psychiatrist. Began discussing termination and informed patient about up coming PTO. Provided support through active listening, validation of feelings, and highlighted patient's strengths.    Diagnosis:   ICD-10-CM   1. Bipolar 2 disorder (HCC)  F31.81     2. Generalized anxiety disorder  F41.1      Plan: Patient's goal is: Help with anxiety and working through things. She reports benefit from having someone to listen to her.   Treatment Target: Increase realistic balanced thinking Explore patient's thoughts, beliefs, automatic thoughts, assumptions Identify unhelpful thinking patterns Process distress and allow for emotional release Questioning and challenging thoughts Cognitive reappraisal   Treatment Target: Increase coping skills Mindfulness practices Intentional / Deep breathing STOP technique Grounding exercises Treatment Target: Reducing vulnerability to "emotional mind" Values clarification   Self-care - nutrition, sleep, exercise Increase living within values  Teach distress tolerance techniques - "what helps me"   Future Appointments  Date Time Provider Department Center  09/29/2021 11:00 AM Kathreen Cosier, LCSW [facility] None  10/21/2021 10:00 AM Kathreen Cosier, LCSW [facility] None   Kathreen Cosier, LCSW

## 2021-09-25 DIAGNOSIS — F411 Generalized anxiety disorder: Secondary | ICD-10-CM | POA: Diagnosis not present

## 2021-09-29 ENCOUNTER — Ambulatory Visit: Payer: Medicaid Other | Admitting: Licensed Clinical Social Worker

## 2021-09-29 DIAGNOSIS — F411 Generalized anxiety disorder: Secondary | ICD-10-CM

## 2021-09-29 DIAGNOSIS — F3181 Bipolar II disorder: Secondary | ICD-10-CM

## 2021-09-29 NOTE — Progress Notes (Signed)
Counselor/Therapist Progress Note  [name], MRN: [id number],    Date: 09/29/2021  Time Spent: 45 minutes    Treatment Type: Psychotherapy  Reported Symptoms:  anxiety, anxious thoughts, overwhelm  Mental Status Exam:  Appearance:   Casual     Behavior:  Appropriate and Sharing  Motor:  Normal  Speech/Language:   Clear and Coherent and Normal Rate  Affect:  Appropriate, Congruent, and Full Range  Mood:  normal  Thought process:  normal  Thought content:    WNL  Sensory/Perceptual disturbances:    WNL  Orientation:  oriented to person, place, time/date, and situation  Attention:  Good  Concentration:  Good  Memory:  WNL  Fund of knowledge:   Good  Insight:    Good  Judgment:   Good  Impulse Control:  Good   Risk Assessment: Danger to Self:  No Self-injurious Behavior: No Danger to Others: No Duty to Warn:no Physical Aggression / Violence:No  Access to Firearms a concern: No  Gang Involvement:No   Subjective: Patient was engaged and cooperative throughout the session using time effectively to discuss thoughts and feelings. Patient voices continued motivation for treatment and understanding of mood and anxiety issues. Patient is likely to benefit from future treatment because  remains motivated to decrease emotions and improve functioning and reports benefit of regular sessions in addressing these symptoms.   Interventions: Cognitive Behavioral Therapy and Mindfulness Meditation Established psychological safety. Checked in with patient regarding their week and engaged patient in processing their emotions about what they have experienced regarding marital conflict, highlighting unhelpful thoughts and reframing thoughts leading to increased distress. LCSW reviewed mindfulness strategies, including deep breathing and resetting from one task to the next with patient. Provided support through active listening, validation of feelings, and highlighted patient's  strengths.   Diagnosis:   ICD-10-CM   1. Bipolar 2 disorder (HCC)  F31.81     2. Generalized anxiety disorder  F41.1      Plan: Patient's goal is: Help with anxiety and working through things. She reports benefit from having someone to listen to her.   Treatment Target: Increase realistic balanced thinking Explore patient's thoughts, beliefs, automatic thoughts, assumptions Identify unhelpful thinking patterns Process distress and allow for emotional release Questioning and challenging thoughts Cognitive reappraisal   Treatment Target: Increase coping skills Mindfulness practices Intentional / Deep breathing STOP technique Grounding exercises Treatment Target: Reducing vulnerability to "emotional mind" Values clarification   Self-care - nutrition, sleep, exercise Increase living within values  Teach distress tolerance techniques - "what helps me"   Future Appointments  Date Time Provider Department Center  10/06/2021 10:00 AM Kathreen Cosier, LCSW [facility] None  10/21/2021 10:00 AM Kathreen Cosier, LCSW [facility] None    Kathreen Cosier, LCSW

## 2021-10-01 ENCOUNTER — Ambulatory Visit: Payer: Self-pay

## 2021-10-02 ENCOUNTER — Other Ambulatory Visit: Payer: Self-pay | Admitting: Family Medicine

## 2021-10-02 NOTE — Telephone Encounter (Signed)
Medication Refill - Medication: Albutorol inhaler  Has the patient contacted their pharmacy? Yes.  Pharmacy ask her to call her provider (Agent: If no, request that the patient contact the pharmacy for the refill. If patient does not wish to contact the pharmacy document the reason why and proceed with request.) (Agent: If yes, when and what did the pharmacy advise?)  Preferred Pharmacy (with phone number or street name): [facility] [address]  [place] Has the patient been seen for an appointment in the last year OR does the patient have an upcoming appointment? No.  Agent: Please be advised that RX refills may take up to 3 business days. We ask that you follow-up with your pharmacy.

## 2021-10-04 NOTE — Telephone Encounter (Signed)
was prescribed at [facility] 2 years ago- pt no longer under prescriber's care  Requested Prescriptions  Refused Prescriptions Disp Refills   albuterol (VENTOLIN HFA) 108 (90 Base) MCG/ACT inhaler 1 each 0    Sig: Inhale 1-2 puffs into the lungs every 6 (six) hours as needed for wheezing or shortness of breath.     Pulmonology:  Beta Agonists Failed - 10/04/2021  7:32 AM      Failed - One inhaler should last at least one month. If the patient is requesting refills earlier, contact the patient to check for uncontrolled symptoms.      Passed - Valid encounter within last 12 months    Recent Outpatient Visits          5 months ago Myalgia   [facility] Gabriel Cirri, NP   6 months ago Myalgia   [facility] Gabriel Cirri, NP   8 months ago Positive pregnancy test   [facility] Dense M, DO   9 months ago Elevated BP without diagnosis of hypertension   [facility] [address] Danelle Berry, PA-C   1 year ago Right otitis media, unspecified otitis media type   [facility] Doren Custard, FNP      Future Appointments            In 5 days Zane Herald, Rudolpho Sevin, FNP [facility], [facility]

## 2021-10-06 ENCOUNTER — Ambulatory Visit: Payer: Medicaid Other | Admitting: Licensed Clinical Social Worker

## 2021-10-06 DIAGNOSIS — F3181 Bipolar II disorder: Secondary | ICD-10-CM

## 2021-10-06 DIAGNOSIS — F411 Generalized anxiety disorder: Secondary | ICD-10-CM

## 2021-10-06 NOTE — Progress Notes (Signed)
Counselor/Therapist Progress Note  [name], MRN: [id number],    Date: 10/06/2021  Time Spent: 45 minutes    Treatment Type: Psychotherapy  Reported Symptoms:  anxiety, anxious thoughts, overwhelm; intermittent sadness, low mood   Mental Status Exam:  Appearance:   Casual     Behavior:  Appropriate and Sharing  Motor:  Normal  Speech/Language:   Clear and Coherent and Normal Rate  Affect:  Appropriate, Congruent, and Full Range  Mood:  normal  Thought process:  normal  Thought content:    WNL  Sensory/Perceptual disturbances:    WNL  Orientation:  oriented to person, place, time/date, and situation  Attention:  Good  Concentration:  Good  Memory:  WNL  Fund of knowledge:   Good  Insight:    Good  Judgment:   Good  Impulse Control:  Good   Risk Assessment: Danger to Self:  No Self-injurious Behavior: No Danger to Others: No Duty to Warn:no Physical Aggression / Violence:No  Access to Firearms a concern: No  Gang Involvement:No   Subjective: Patient was engaged and cooperative throughout the session using time effectively to discuss thoughts and feelings. Patient voices continued motivation for treatment and understanding of mood and anxiety issues. Patient is likely to benefit from future treatment because she remains motivated to manage symptoms and reports benefit of regular sessions in addressing these symptoms.   Interventions: Cognitive Behavioral Therapy Established psychological safety. Checked in with patient regarding their week. Engaged patient in processing current psychosocial stressors, multiple stressors leading to anxiety and low mood. LCSW reviewed distress tolerance and intentional breathing skills with patient. Provided support through active listening, validation of feelings, and highlighted patient's strengths.    Diagnosis:   ICD-10-CM   1. Bipolar 2 disorder (HCC)  F31.81     2. Generalized anxiety disorder  F41.1      Plan:   Patient's goal is: Help with anxiety and working through things. She reports benefit from having someone to listen to her.   Treatment Target: Increase realistic balanced thinking Explore patients thoughts, beliefs, automatic thoughts, assumptions Identify unhelpful thinking patterns Process distress and allow for emotional release Questioning and challenging thoughts Cognitive reappraisal   Treatment Target: Increase coping skills Mindfulness practices Intentional / Deep breathing STOP technique Grounding exercises Treatment Target: Reducing vulnerability to emotional mind Values clarification   Self-care - nutrition, sleep, exercise Increase living within values  Teach distress tolerance techniques - what helps me   Future Appointments  Date Time Provider Department Center  10/09/2021  3:00 PM Berniece Salines, FNP [facility]  10/21/2021 10:00 AM Kathreen Cosier, LCSW [facility] None    Kathreen Cosier, LCSW

## 2021-10-09 ENCOUNTER — Encounter: Payer: Self-pay | Admitting: Nurse Practitioner

## 2021-10-09 ENCOUNTER — Other Ambulatory Visit: Payer: Self-pay

## 2021-10-09 ENCOUNTER — Ambulatory Visit: Payer: Medicaid Other | Admitting: Nurse Practitioner

## 2021-10-09 VITALS — BP 118/72 | HR 73 | Temp 98.8°F | Resp 16 | Ht 61.0 in | Wt 194.2 lb

## 2021-10-09 DIAGNOSIS — R5383 Other fatigue: Secondary | ICD-10-CM

## 2021-10-09 DIAGNOSIS — K219 Gastro-esophageal reflux disease without esophagitis: Secondary | ICD-10-CM

## 2021-10-09 DIAGNOSIS — J4521 Mild intermittent asthma with (acute) exacerbation: Secondary | ICD-10-CM | POA: Diagnosis not present

## 2021-10-09 DIAGNOSIS — R0683 Snoring: Secondary | ICD-10-CM | POA: Diagnosis not present

## 2021-10-09 MED ORDER — OMEPRAZOLE 20 MG PO CPDR: 20.0000 mg | DELAYED_RELEASE_CAPSULE | Freq: Every day | ORAL | 3 refills | Status: DC

## 2021-10-09 MED ORDER — ALBUTEROL SULFATE HFA 108 (90 BASE) MCG/ACT IN AERS: 1.0000 | INHALATION_SPRAY | Freq: Four times a day (QID) | RESPIRATORY_TRACT | 3 refills | Status: DC | PRN

## 2021-10-09 NOTE — Progress Notes (Signed)
BP 118/72    Pulse 73    Temp 98.8 F (37.1 C) (Oral)    Resp 16    Ht 5\' 1"  (1.549 m)    Wt 194 lb 3.2 oz (88.1 kg)    LMP 04/08/2021    SpO2 98%    BMI 36.69 kg/m    Subjective:    Patient ID: 04/10/2021, female    DOB: [birth year], 25 y.o.   MRN: [id number]  HPI: Janet Rich is a 26 y.o. female, here alone  Chief Complaint  Patient presents with   Medication Refill    inhalers   Fatigue   Asthma: She says she has had a few episodes of wheezing, especially in the cold weather.  She says she has had to use it once or twice a day. She is doing the Symbicort every morning.    Snoring: She says her husband says she snores every night.  She wakes up tired.  She does not nap during the day because she has three kids.  Epworth Sleepiness Scale 14.  Will order sleep study.   Fatigue:  She says she has had fatigue for awhile but has increased.  Will order sleep study and labs.    GERD:  She says that she has had acid reflux for many years but never took anything for it.  She says it has been getting worse and is not happening almost everyday.  Discussed avoiding trigger foods and not laying down after eating.  Will start omeprazole.   Relevant past medical, surgical, family and social history reviewed and updated as indicated. Interim medical history since our last visit reviewed. Allergies and medications reviewed and updated.  Review of Systems  Constitutional: Negative for fever or weight change.  Respiratory: Negative for cough and shortness of breath.  Positve for wheezing Cardiovascular: Negative for chest pain or palpitations.  Gastrointestinal: Negative for abdominal pain, no bowel changes.  Musculoskeletal: Negative for gait problem or joint swelling.  Skin: Negative for rash.  Neurological: Negative for dizziness or headache.  No other specific complaints in a complete review of systems (except as listed in HPI above).      Objective:    BP 118/72    Pulse 73     Temp 98.8 F (37.1 C) (Oral)    Resp 16    Ht 5\' 1"  (1.549 m)    Wt 194 lb 3.2 oz (88.1 kg)    LMP 04/08/2021    SpO2 98%    BMI 36.69 kg/m   Wt Readings from Last 3 Encounters:  10/09/21 194 lb 3.2 oz (88.1 kg)  06/22/21 210 lb 1.6 oz (95.3 kg)  06/18/21 210 lb (95.3 kg)    Physical Exam  Constitutional: Patient appears well-developed and well-nourished.  No distress.  HEENT: head atraumatic, normocephalic, pupils equal and reactive to light, neck supple Cardiovascular: Normal rate, regular rhythm and normal heart sounds.  No murmur heard. No BLE edema. Pulmonary/Chest: Effort normal and breath sounds normal. No respiratory distress. Abdominal: Soft.  There is no tenderness. Psychiatric: Patient has a normal mood and affect. behavior is normal. Judgment and thought content normal.   Results for orders placed or performed during the hospital encounter of 06/22/21  Lipase, blood  Result Value Ref Range   Lipase 32 11 - 51 U/L  Comprehensive metabolic panel  Result Value Ref Range   Sodium 134 (L) 135 - 145 mmol/L   Potassium 4.0 3.5 - 5.1 mmol/L   Chloride  103 98 - 111 mmol/L   CO2 23 22 - 32 mmol/L   Glucose, Bld 104 (H) 70 - 99 mg/dL   BUN 10 6 - 20 mg/dL   Creatinine, Ser 5.09 0.44 - 1.00 mg/dL   Calcium 8.9 8.9 - 32.6 mg/dL   Total Protein 7.3 6.5 - 8.1 g/dL   Albumin 3.8 3.5 - 5.0 g/dL   AST 14 (L) 15 - 41 U/L   ALT 17 0 - 44 U/L   Alkaline Phosphatase 66 38 - 126 U/L   Total Bilirubin 0.4 0.3 - 1.2 mg/dL   GFR, Estimated >71 >24 mL/min   Anion gap 8 5 - 15  CBC  Result Value Ref Range   WBC 8.7 4.0 - 10.5 K/uL   RBC 4.76 3.87 - 5.11 MIL/uL   Hemoglobin 13.2 12.0 - 15.0 g/dL   HCT 58.0 99.8 - 33.8 %   MCV 84.0 80.0 - 100.0 fL   MCH 27.7 26.0 - 34.0 pg   MCHC 33.0 30.0 - 36.0 g/dL   RDW 25.0 53.9 - 76.7 %   Platelets 304 150 - 400 K/uL   nRBC 0.2 0.0 - 0.2 %  Urinalysis, Complete w Microscopic  Result Value Ref Range   Color, Urine YELLOW YELLOW   APPearance  CLEAR CLEAR   Specific Gravity, Urine 1.020 1.005 - 1.030   pH 7.5 5.0 - 8.0   Glucose, UA NEGATIVE NEGATIVE mg/dL   Hgb urine dipstick NEGATIVE NEGATIVE   Bilirubin Urine NEGATIVE NEGATIVE   Ketones, ur NEGATIVE NEGATIVE mg/dL   Protein, ur 30 (A) NEGATIVE mg/dL   Nitrite NEGATIVE NEGATIVE   Leukocytes,Ua NEGATIVE NEGATIVE   RBC / HPF 0-5 0 - 5 RBC/hpf   WBC, UA 0-5 0 - 5 WBC/hpf   Bacteria, UA RARE (A) NONE SEEN   Squamous Epithelial / LPF 0-5 0 - 5   Mucus PRESENT       Assessment & Plan:   1. Mild intermittent asthma with acute exacerbation -continue taking symbicort - albuterol (VENTOLIN HFA) 108 (90 Base) MCG/ACT inhaler; Inhale 1-2 puffs into the lungs every 6 (six) hours as needed for wheezing or shortness of breath.  Dispense: 1 each; Refill: 3  2. Fatigue, unspecified type  - TSH - CBC with Differential/Platelet - COMPLETE METABOLIC PANEL WITH GFR  3. Snoring  - Ambulatory referral to Pulmonology  4. Gastroesophageal reflux disease without esophagitis  - omeprazole (PRILOSEC) 20 MG capsule; Take 1 capsule (20 mg total) by mouth daily.  Dispense: 30 capsule; Refill: 3   Follow up plan: Return in about 6 months (around 04/09/2022) for follow up.

## 2021-10-10 LAB — CBC WITH DIFFERENTIAL/PLATELET
Absolute Monocytes: 353 cells/uL (ref 200–950)
Basophils Absolute: 8 cells/uL (ref 0–200)
Basophils Relative: 0.1 %
Eosinophils Absolute: 98 cells/uL (ref 15–500)
Eosinophils Relative: 1.2 %
HCT: 40.7 % (ref 35.0–45.0)
Hemoglobin: 13.6 g/dL (ref 11.7–15.5)
Lymphs Abs: 2919 cells/uL (ref 850–3900)
MCH: 28.3 pg (ref 27.0–33.0)
MCHC: 33.4 g/dL (ref 32.0–36.0)
MCV: 84.8 fL (ref 80.0–100.0)
MPV: 10.2 fL (ref 7.5–12.5)
Monocytes Relative: 4.3 %
Neutro Abs: 4822 cells/uL (ref 1500–7800)
Neutrophils Relative %: 58.8 %
Platelets: 364 10*3/uL (ref 140–400)
RBC: 4.8 10*6/uL (ref 3.80–5.10)
RDW: 14.1 % (ref 11.0–15.0)
Total Lymphocyte: 35.6 %
WBC: 8.2 10*3/uL (ref 3.8–10.8)

## 2021-10-10 LAB — COMPLETE METABOLIC PANEL WITH GFR
AG Ratio: 1.4 (calc) (ref 1.0–2.5)
ALT: 14 U/L (ref 6–29)
AST: 11 U/L (ref 10–30)
Albumin: 4.3 g/dL (ref 3.6–5.1)
Alkaline phosphatase (APISO): 80 U/L (ref 31–125)
BUN: 10 mg/dL (ref 7–25)
CO2: 27 mmol/L (ref 20–32)
Calcium: 9.5 mg/dL (ref 8.6–10.2)
Chloride: 101 mmol/L (ref 98–110)
Creat: 0.59 mg/dL (ref 0.50–0.96)
Globulin: 3 g/dL (calc) (ref 1.9–3.7)
Glucose, Bld: 114 mg/dL — ABNORMAL HIGH (ref 65–99)
Potassium: 3.9 mmol/L (ref 3.5–5.3)
Sodium: 137 mmol/L (ref 135–146)
Total Bilirubin: 0.3 mg/dL (ref 0.2–1.2)
Total Protein: 7.3 g/dL (ref 6.1–8.1)
eGFR: 127 mL/min/{1.73_m2} (ref >=60)

## 2021-10-10 LAB — TSH: TSH: 0.71 mIU/L

## 2021-10-19 DIAGNOSIS — Z419 Encounter for procedure for purposes other than remedying health state, unspecified: Secondary | ICD-10-CM | POA: Diagnosis not present

## 2021-10-21 ENCOUNTER — Ambulatory Visit: Payer: Medicaid Other | Admitting: Licensed Clinical Social Worker

## 2021-10-24 DIAGNOSIS — F3181 Bipolar II disorder: Secondary | ICD-10-CM | POA: Diagnosis not present

## 2021-10-27 ENCOUNTER — Ambulatory Visit: Payer: Medicaid Other | Admitting: Licensed Clinical Social Worker

## 2021-10-27 DIAGNOSIS — F411 Generalized anxiety disorder: Secondary | ICD-10-CM

## 2021-10-27 DIAGNOSIS — F3181 Bipolar II disorder: Secondary | ICD-10-CM

## 2021-10-27 NOTE — Progress Notes (Signed)
Counselor/Therapist Progress Note  Patient ID: Genae Strine, MRN: [id number],    Date: 10/27/2021  Time Spent: 45 minutes   Treatment Type: Psychotherapy  Reported Symptoms:  overall mood stability, low motivation; anxiety, anxious thoughts, irritability  Mental Status Exam:  Appearance:   Casual     Behavior:  Appropriate and Sharing  Motor:  Normal  Speech/Language:   Clear and Coherent and Normal Rate  Affect:  Appropriate, Congruent, and Full Range  Mood:  anxious  Thought process:  normal  Thought content:    WNL  Sensory/Perceptual disturbances:    WNL  Orientation:  oriented to person, place, time/date, and situation  Attention:  Good  Concentration:  Good  Memory:  WNL  Fund of knowledge:   Good  Insight:    Good  Judgment:   Good  Impulse Control:  Good   Risk Assessment: Danger to Self:  No Self-injurious Behavior: No Danger to Others: No Duty to Warn:no Physical Aggression / Violence:No  Access to Firearms a concern: No  Gang Involvement:No   Subjective: Patient was receptive to feedback and intervention from LCSW and actively and effectively participated throughout the session. Patient is likely to benefit from future treatment because she remains motivated to decrease mood and anxiety and reports benefit of regular sessions in addressing these symptoms.   Interventions: Cognitive Behavioral Therapy and Mindfulness Meditation Established psychological safety. Checked in with patient. Discussed medication change and possible additional diagnosis of ADHD. Provided supportive space encouraging emotional release and processing of current psychosocial stressors, increased stress and anxiety due to current marital conflict. Validated patient's feelings of frustration and overwhelm. LCSW reviewed mindfulness practices to reset and focus on present moment and briefly discussed effective communication skills with patient. Provided support through active listening,  validation of feelings, and highlighted patient's strengths.   Diagnosis:   ICD-10-CM   1. Bipolar 2 disorder (HCC)  F31.81     2. Generalized anxiety disorder  F41.1      Plan: Patient's goal is: Help with anxiety and working through things. She reports benefit from having someone to listen to her.   Treatment Target: Increase realistic balanced thinking Explore patients thoughts, beliefs, automatic thoughts, assumptions Identify unhelpful thinking patterns Process distress and allow for emotional release Questioning and challenging thoughts Cognitive reappraisal   Treatment Target: Increase coping skills Mindfulness practices Intentional / Deep breathing STOP technique Grounding exercises Treatment Target: Reducing vulnerability to emotional mind Values clarification   Self-care - nutrition, sleep, exercise Increase living within values  Teach distress tolerance techniques - what helps me   Future Appointments  Date Time Provider Department Center  11/04/2021  2:00 PM Kathreen Cosier, LCSW [facility] None     Kathreen Cosier, LCSW

## 2021-11-04 ENCOUNTER — Ambulatory Visit: Payer: Medicaid Other | Admitting: Licensed Clinical Social Worker

## 2021-11-04 DIAGNOSIS — F411 Generalized anxiety disorder: Secondary | ICD-10-CM

## 2021-11-04 DIAGNOSIS — F3181 Bipolar II disorder: Secondary | ICD-10-CM

## 2021-11-04 NOTE — Progress Notes (Signed)
Counselor/Therapist Progress Note  [name], MRN: [id number],    Date: 11/04/2021  Time Spent: 45 minutes    Treatment Type: Psychotherapy  Reported Symptoms:  Anxiety, anxious thoughts, overwhelm   Mental Status Exam:  Appearance:   Casual     Behavior:  Appropriate and Sharing  Motor:  Normal  Speech/Language:   Clear and Coherent and Normal Rate  Affect:  Appropriate, Congruent, and Full Range  Mood:  normal  Thought process:  normal  Thought content:    WNL  Sensory/Perceptual disturbances:    WNL  Orientation:  oriented to person, place, time/date, and situation  Attention:  Good  Concentration:  Good  Memory:  WNL  Fund of knowledge:   Good  Insight:    Good  Judgment:   Good  Impulse Control:  Good   Risk Assessment: Danger to Self:  No Self-injurious Behavior: No Danger to Others: No Duty to Warn:no Physical Aggression / Violence:No  Access to Firearms a concern: No  Gang Involvement:No   Subjective: Patient was engaged and cooperative throughout the session using time effectively to discuss thoughts and feelings. Patient was receptive to feedback and intervention from LCSW.  Patient is likely to benefit from future treatment because they remain motivated to manage mood and anxiety symptoms  and reports benefit of the combination of regular sessions and medication management.      Interventions: Cognitive Behavioral Therapy and Mindfulness Meditation Established psychological safety. Checked in with patient regarding their week. LCSW assisted patient in processing their thoughts and emotions about current stressors related to interactions with family of origin and various other stressors leading to overwhelm. LCSW engaged patient in mindfulness practice to remain in the moment and to reduce anxiety related to future thinking. Explored patient's perception of interactions with family, identifying origins of these feelings and reviewing STOP to better  manage this moving forward. Encouraged patient to notice unhelpful thoughts leading to distress and reframe/challenge these thought patterns. Provided support through active listening, validation of feelings, and highlighted patient's strengths.   Diagnosis:   ICD-10-CM   1. Bipolar 2 disorder (HCC)  F31.81     2. Generalized anxiety disorder  F41.1      Plan: Patient's goal is: Help with anxiety and working through things. She reports benefit from having someone to listen to her.   Treatment Target: Increase realistic balanced thinking Explore patients thoughts, beliefs, automatic thoughts, assumptions Identify unhelpful thinking patterns Process distress and allow for emotional release Questioning and challenging thoughts Cognitive reappraisal   Treatment Target: Increase coping skills Mindfulness practices Intentional / Deep breathing STOP technique Grounding exercises Treatment Target: Reducing vulnerability to emotional mind Values clarification   Self-care - nutrition, sleep, exercise Increase living within values  Teach distress tolerance techniques - what helps me   Future Appointments  Date Time Provider [place]  11/10/2021  2:00 PM Milton Ferguson, LCSW [facility] None  11/28/2021 11:30 AM Parrett, Fonnie Mu, NP [facility] None    Milton Ferguson, LCSW

## 2021-11-10 ENCOUNTER — Encounter: Payer: Self-pay | Admitting: Nurse Practitioner

## 2021-11-10 ENCOUNTER — Telehealth (INDEPENDENT_AMBULATORY_CARE_PROVIDER_SITE_OTHER): Payer: Medicaid Other | Admitting: Nurse Practitioner

## 2021-11-10 ENCOUNTER — Ambulatory Visit: Payer: Medicaid Other | Admitting: Licensed Clinical Social Worker

## 2021-11-10 ENCOUNTER — Ambulatory Visit: Payer: Self-pay

## 2021-11-10 ENCOUNTER — Other Ambulatory Visit: Payer: Self-pay

## 2021-11-10 DIAGNOSIS — R42 Dizziness and giddiness: Secondary | ICD-10-CM | POA: Diagnosis not present

## 2021-11-10 DIAGNOSIS — F3181 Bipolar II disorder: Secondary | ICD-10-CM | POA: Diagnosis not present

## 2021-11-10 DIAGNOSIS — G43909 Migraine, unspecified, not intractable, without status migrainosus: Secondary | ICD-10-CM

## 2021-11-10 DIAGNOSIS — F411 Generalized anxiety disorder: Secondary | ICD-10-CM

## 2021-11-10 MED ORDER — MECLIZINE HCL 25 MG PO TABS: 25.0000 mg | ORAL_TABLET | Freq: Three times a day (TID) | ORAL | 0 refills | Status: AC | PRN

## 2021-11-10 MED ORDER — IBUPROFEN 800 MG PO TABS: 800.0000 mg | ORAL_TABLET | Freq: Three times a day (TID) | ORAL | 2 refills | Status: DC | PRN

## 2021-11-10 NOTE — Telephone Encounter (Signed)
Chief Complaint: Dizziness Symptoms: Vomiting (not now), headache (not now) Frequency: Onset 11/02/21, off and on all week, not present at this time Pertinent Negatives: Patient denies symptoms at this time Disposition: [] ED /[] Urgent Care (no appt availability in office) / [x] Appointment(In office/virtual)/ []  [facility]/ [] Home Care/ [] Refused Recommended Disposition /[] [place] [facility]/ []  Follow-up with PCP Additional Notes: N/A   Reason for Disposition  [1] MILD dizziness (e.g., vertigo; walking normally) AND [2] has NOT been evaluated by physician for this  Answer Assessment - Initial Assessment Questions 1. DESCRIPTION: "Describe your dizziness."     Room spinning, feel spinning 2. VERTIGO: "Do you feel like either you or the room is spinning or tilting?"      Yes 3. LIGHTHEADED: "Do you feel lightheaded?" (e.g., somewhat faint, woozy, weak upon standing)     Yes 4. SEVERITY: "How bad is it?"  "Can you walk?"   - MILD: Feels slightly dizzy and unsteady, but is walking normally.   - MODERATE: Feels unsteady when walking, but not falling; interferes with normal activities (e.g., school, work).   - SEVERE: Unable to walk without falling, or requires assistance to walk without falling.     None at the present time; moderate 5. ONSET:  "When did the dizziness begin?"     11/02/21, off an on 6. AGGRAVATING FACTORS: "Does anything make it worse?" (e.g., standing, change in head position)     Movement, change head position 7. CAUSE: "What do you think is causing the dizziness?"     N/A 8. RECURRENT SYMPTOM: "Have you had dizziness before?" If Yes, ask: "When was the last time?" "What happened that time?"     Yes, vertigo 9. OTHER SYMPTOMS: "Do you have any other symptoms?" (e.g., headache, weakness, numbness, vomiting, earache)     Headache, vomiting 10. PREGNANCY: "Is there any chance you are pregnant?" "When was your last menstrual period?"       No  Protocols  used: Dizziness - Vertigo-A-AH

## 2021-11-10 NOTE — Progress Notes (Signed)
Name: Janet Rich   MRN: [id number]    DOB: 11/23/1995   Date:11/10/2021       Progress Note  Subjective  Chief Complaint  Chief Complaint  Patient presents with   Dizziness    Vertigo, vomiting    I connected with  Marvel Plan  on 11/10/21 at 11:40 AM EST by a video enabled telemedicine application and verified that I am speaking with the correct person using two identifiers.  I discussed the limitations of evaluation and management by telemedicine and the availability of in person appointments. The patient expressed understanding and agreed to proceed with a virtual visit  Staff also discussed with the patient that there may be a patient responsible charge related to this service. Patient Location: home Provider Location: [facility] Additional Individuals present: alone  HPI  Vertigo:  She says that for the last week she has had vertigo.  She says especially in the morning.  She says when she moves her head she gets dizzy.  She says she has had this before and was seen at ENT and was taught Epley maneuvers.  She says that ENT said they did not see anything of concern and she was then referred to neurology.  She was seen at [facility] with Dr. Naomie Dean. She says she had an MRI on   05/15/2020. The MRI brain impression was tiny vessels noted near the right trigeminal nerve cisternal segment and brain parenchyma, otherwise unremarkable.  She was supposed to return for an MRI with contrast after the birth of her girls but she did not.  Will place referral back to neurology to continue work up.   Migraine: She says she takes ibuprofen for her migraines.  She says that it usually works for her.  She says she needs a refill. Will send in refill. Discussed that there are other treatments for migraines she may want to look into.  Discussed that since she is seeing neurology she can discuss with them. She is agreeable to plan.   Patient Active Problem List   Diagnosis Date  Noted   Endometriosis determined by laparoscopy 05/08/2021   Myalgia 03/11/2021   Elevated BP without diagnosis of hypertension 01/08/2021   Dichorionic diamniotic twin pregnancy in third trimester 08/14/2020   Preeclampsia 08/14/2020   Twin pregnancy delivered vaginally 08/14/2020   Preterm labor 06/17/2020   Right trigeminal neuralgia 11/08/2019   Endometriosis 03/27/2019   PCOS (polycystic ovarian syndrome) 03/27/2019   Moderate persistent asthma 03/07/2019   Post-nasal drainage 03/07/2019   Irritable bowel syndrome with diarrhea 03/07/2019   Gastroesophageal reflux disease without esophagitis 03/07/2019   Class 2 obesity due to excess calories without serious comorbidity with body mass index (BMI) of 38.0 to 38.9 in adult 09/02/2018   Bipolar 2 disorder (HCC) 08/12/2018   History of low birth weight 03/11/2018   Vulvar atrophy 03/11/2018   History of migraine headaches 05/01/2015   Anxiety and depression 05/01/2015    Social History   Tobacco Use   Smoking status: Former    Types: Cigarettes    Quit date: 12/20/2009    Years since quitting: 11.8   Smokeless tobacco: Never   Tobacco comments:    age [age], quit  Substance Use Topics   Alcohol use: Not Currently    Comment: b/c alcohol interferes with meds     Current Outpatient Medications:    acetaminophen (TYLENOL) 500 MG tablet, Take 2 tablets (1,000 mg total) by mouth every 6 (six) hours as  needed for moderate pain., Disp: 30 tablet, Rfl: 0   albuterol (VENTOLIN HFA) 108 (90 Base) MCG/ACT inhaler, Inhale 1-2 puffs into the lungs every 6 (six) hours as needed for wheezing or shortness of breath., Disp: 1 each, Rfl: 3   ALPRAZolam (XANAX) 0.25 MG tablet, Take 0.25 mg by mouth at bedtime as needed for anxiety., Disp: , Rfl:    budesonide-formoterol (SYMBICORT) 80-4.5 MCG/ACT inhaler, Inhale 2 puffs into the lungs 2 (two) times daily., Disp: 1 each, Rfl: 3   buPROPion (WELLBUTRIN XL) 150 MG 24 hr tablet, Take 150 mg by  mouth daily., Disp: , Rfl:    ibuprofen (ADVIL) 800 MG tablet, Take 800 mg by mouth every 8 (eight) hours as needed., Disp: , Rfl:    lamoTRIgine (LAMICTAL) 25 MG tablet, Take 25 mg by mouth daily., Disp: , Rfl:    lurasidone (LATUDA) 20 MG TABS tablet, Take 1 tablet (20 mg total) by mouth daily., Disp: 90 tablet, Rfl: 3   omeprazole (PRILOSEC) 20 MG capsule, Take 1 capsule (20 mg total) by mouth daily., Disp: 30 capsule, Rfl: 3   propranolol (INDERAL) 10 MG tablet, Take 1 tablet (10 mg total) by mouth 2 (two) times daily., Disp: 60 tablet, Rfl: 1   gabapentin (NEURONTIN) 800 MG tablet, Take 1 tablet (800 mg total) by mouth at bedtime for 14 days. Take nightly for 3 days, then up to 14 days as needed, Disp: 14 tablet, Rfl: 0  Allergies  Allergen Reactions   Amoxicillin Shortness Of Breath and Nausea Only    Dizziness   Other     Must have anti-emetic prior to being given any narcotics    Latex Rash    I personally reviewed active problem list, medication list, allergies, notes from last encounter with the patient/caregiver today.  ROS  Constitutional: Negative for fever or weight change.  Respiratory: Negative for cough and shortness of breath.   Cardiovascular: Negative for chest pain or palpitations.  Gastrointestinal: Negative for abdominal pain, no bowel changes.  Musculoskeletal: Negative for gait problem or joint swelling.  Skin: Negative for rash.  Neurological: Negative for dizziness or headache.  No other specific complaints in a complete review of systems (except as listed in HPI above).   Objective  Virtual encounter, vitals not obtained.  There is no height or weight on file to calculate BMI.  Nursing Note and Vital Signs reviewed.  Physical Exam  Awake, alert and oriented, speaking in complete sentences  No results found for this or any previous visit (from the past 72 hour(s)).  Assessment & Plan  1. Vertigo -do epley maneuvers - meclizine (ANTIVERT) 25  MG tablet; Take 1 tablet (25 mg total) by mouth 3 (three) times daily as needed for up to 10 days for dizziness.  Dispense: 30 tablet; Refill: 0 - Ambulatory referral to Neurology  2. Migraine without status migrainosus, not intractable, unspecified migraine type  - ibuprofen (ADVIL) 800 MG tablet; Take 1 tablet (800 mg total) by mouth every 8 (eight) hours as needed.  Dispense: 30 tablet; Refill: 2 - Ambulatory referral to Neurology   -Red flags and when to present for emergency care or RTC including fever >101.49F, chest pain, shortness of breath, new/worsening/un-resolving symptoms,  reviewed with patient at time of visit. Follow up and care instructions discussed and provided in AVS. - I discussed the assessment and treatment plan with the patient. The patient was provided an opportunity to ask questions and all were answered. The patient agreed with  the plan and demonstrated an understanding of the instructions.  I provided 20 minutes of non-face-to-face time during this encounter.  Berniece Salines, FNP

## 2021-11-10 NOTE — Progress Notes (Signed)
Counselor/Therapist Progress Note  [name], MRN: [id number],    Date: 11/10/2021  Time Spent: 45 minutes    Treatment Type: Psychotherapy  Reported Symptoms:  Anxiety, anxious thoughts, stress, low mood, body aches  Mental Status Exam:  Appearance:   Casual     Behavior:  Appropriate and Sharing  Motor:  Normal  Speech/Language:   Clear and Coherent and Normal Rate  Affect:  Appropriate, Congruent, and Full Range  Mood:  normal  Thought process:  normal  Thought content:    WNL  Sensory/Perceptual disturbances:    WNL  Orientation:  oriented to person, place, time/date, and situation  Attention:  Good  Concentration:  Good  Memory:  WNL  Fund of knowledge:   Good  Insight:    Good  Judgment:   Good  Impulse Control:  Good   Risk Assessment: Danger to Self:  No Self-injurious Behavior: No Danger to Others: No Duty to Warn:no Physical Aggression / Violence:No  Access to Firearms a concern: No  Gang Involvement:No   Subjective: Patient was engaged and cooperative throughout the session using time effectively to discuss thoughts and feelings. Patient was receptive to feedback and intervention from LCSW. Patient is likely to benefit from future treatment because they remain motivated to decrease symptoms and improve functioning and reports benefit of regular sessions.        Interventions: Cognitive Behavioral Therapy and Client Centered Established psychological safety. Checked in with patient regarding her week and engaged patient in processing current psychosocial stressors, continued daily struggles managing symptoms, family, marriage, and parenting. Validated patient's feelings of embarrassment and overwhelm. LCSW highlighted patient's unhelpful thought patterns, challenging thoughts leading to increased distress and reviewed previous session regarding unhelpful thoughts. Provided support through active listening, validation of feelings, and highlighted  patient's strengths.   Diagnosis:   ICD-10-CM   1. Generalized anxiety disorder  F41.1     2. Bipolar 2 disorder (HCC)  F31.81      Plan: Patient's goal is: Help with anxiety and working through things. She reports benefit from having someone to listen to her.   Treatment Target: Increase realistic balanced thinking Explore patients thoughts, beliefs, automatic thoughts, assumptions Identify unhelpful thinking patterns Process distress and allow for emotional release Questioning and challenging thoughts Cognitive reappraisal   Treatment Target: Increase coping skills Mindfulness practices Intentional / Deep breathing STOP technique Grounding exercises Treatment Target: Reducing vulnerability to emotional mind Values clarification   Self-care - nutrition, sleep, exercise Increase living within values  Teach distress tolerance techniques - what helps me    Future Appointments  Date Time Provider Department Center  11/28/2021 11:30 AM Parrett, Virgel Bouquet, NP [facility] None  12/02/2021  1:00 PM Kathreen Cosier, LCSW [facility] None      Kathreen Cosier, LCSW

## 2021-11-13 ENCOUNTER — Ambulatory Visit: Payer: Medicaid Other | Admitting: Nurse Practitioner

## 2021-11-13 ENCOUNTER — Encounter: Payer: Self-pay | Admitting: Nurse Practitioner

## 2021-11-13 VITALS — BP 110/62 | HR 85 | Temp 98.3°F | Resp 16 | Ht 61.0 in | Wt 199.6 lb

## 2021-11-13 DIAGNOSIS — R591 Generalized enlarged lymph nodes: Secondary | ICD-10-CM | POA: Diagnosis not present

## 2021-11-13 DIAGNOSIS — H6503 Acute serous otitis media, bilateral: Secondary | ICD-10-CM | POA: Diagnosis not present

## 2021-11-13 MED ORDER — DOXYCYCLINE HYCLATE 100 MG PO TABS: 100.0000 mg | ORAL_TABLET | Freq: Two times a day (BID) | ORAL | 0 refills | Status: AC

## 2021-11-13 MED ORDER — OFLOXACIN 0.3 % OT SOLN: 10.0000 [drp] | Freq: Every day | OTIC | 0 refills | Status: DC

## 2021-11-13 NOTE — Progress Notes (Signed)
BP 110/62    Pulse 85    Temp 98.3 F (36.8 C) (Oral)    Resp 16    Ht _0  (1.549 m)    Wt 199 lb 9.6 oz (90.5 kg)    LMP 04/08/2021    SpO2 97%    BMI 37.71 kg/m    Subjective:    [name], female    DOB: Jul 09, 1996, 26 y.o.   MRN: [id number]  HPI: Janet Rich is a 26 y.o. female, here alone  Chief Complaint  Patient presents with   Ear Pain    Both ears-1/23 night time has not improved.   Mass    L side on the back of head near neck.   Bilateral ear pain: She says that she has been having bilateral ear pain for a few days.  She says it is very painful.  She denies any fever or nasal congestion. Upon inspection she does have bilateral otitis media with effusions.  Will treat with ofloxacin.   Lymphadenopathy: She says a few days ago she noticed a lump close to the back of her head by her neck on the left side.  She says it is a little tender.   She denies any fever, redness or drainage.  Noted Swollen lymph node on the left side posterior cervical.  Will get labs and course of antibiotics.  If no improvement follow up.  Relevant past medical, surgical, family and social history reviewed and updated as indicated. Interim medical history since our last visit reviewed. Allergies and medications reviewed and updated.  Review of Systems  Constitutional: Negative for fever or weight change.  HEENT: positive for lump on left side of upper neck head area, and bilateral ear pain Respiratory: Negative for cough and shortness of breath.   Cardiovascular: Negative for chest pain or palpitations.  Gastrointestinal: Negative for abdominal pain, no bowel changes.  Musculoskeletal: Negative for gait problem or joint swelling.  Skin: Negative for rash.  Neurological: Negative for dizziness or headache.  No other specific complaints in a complete review of systems (except as listed in HPI above).      Objective:    BP 110/62    Pulse 85    Temp 98.3 F (36.8 C) (Oral)     Resp 16    Ht _1  (1.549 m)    Wt 199 lb 9.6 oz (90.5 kg)    LMP 04/08/2021    SpO2 97%    BMI 37.71 kg/m   Wt Readings from Last 3 Encounters:  11/13/21 199 lb 9.6 oz (90.5 kg)  10/09/21 194 lb 3.2 oz (88.1 kg)  06/22/21 210 lb 1.6 oz (95.3 kg)    Physical Exam  Constitutional: Patient appears well-developed and well-nourished. Obese  No distress.  HEENT: head atraumatic, normocephalic, pupils equal and reactive to light, ears TMS red, inflamed with effusion, neck supple, Swollen lymph node on the left side posterior cervical Cardiovascular: Normal rate, regular rhythm and normal heart sounds.  No murmur heard. No BLE edema. Pulmonary/Chest: Effort normal and breath sounds normal. No respiratory distress. Abdominal: Soft.  There is no tenderness. Psychiatric: Patient has a normal mood and affect. behavior is normal. Judgment and thought content normal.   Results for orders placed or performed in visit on 10/09/21  TSH  Result Value Ref Range   TSH 0.71 mIU/L  CBC with Differential/Platelet  Result Value Ref Range   WBC 8.2 3.8 - 10.8 Thousand/uL   RBC 4.80  3.80 - 5.10 Million/uL   Hemoglobin 13.6 11.7 - 15.5 g/dL   HCT 40.7 35.0 - 45.0 %   MCV 84.8 80.0 - 100.0 fL   MCH 28.3 27.0 - 33.0 pg   MCHC 33.4 32.0 - 36.0 g/dL   RDW 14.1 11.0 - 15.0 %   Platelets 364 140 - 400 Thousand/uL   MPV 10.2 7.5 - 12.5 fL   Neutro Abs 4,822 1,500 - 7,800 cells/uL   Lymphs Abs 2,919 850 - 3,900 cells/uL   Absolute Monocytes 353 200 - 950 cells/uL   Eosinophils Absolute 98 15 - 500 cells/uL   Basophils Absolute 8 0 - 200 cells/uL   Neutrophils Relative % 58.8 %   Total Lymphocyte 35.6 %   Monocytes Relative 4.3 %   Eosinophils Relative 1.2 %   Basophils Relative 0.1 %  COMPLETE METABOLIC PANEL WITH GFR  Result Value Ref Range   Glucose, Bld 114 (H) 65 - 99 mg/dL   BUN 10 7 - 25 mg/dL   Creat 0.59 0.50 - 0.96 mg/dL   eGFR 127 > OR = 60 mL/min/1.68m   BUN/Creatinine Ratio NOT  APPLICABLE 6 - 22 (calc)   Sodium 137 135 - 146 mmol/L   Potassium 3.9 3.5 - 5.3 mmol/L   Chloride 101 98 - 110 mmol/L   CO2 27 20 - 32 mmol/L   Calcium 9.5 8.6 - 10.2 mg/dL   Total Protein 7.3 6.1 - 8.1 g/dL   Albumin 4.3 3.6 - 5.1 g/dL   Globulin 3.0 1.9 - 3.7 g/dL (calc)   AG Ratio 1.4 1.0 - 2.5 (calc)   Total Bilirubin 0.3 0.2 - 1.2 mg/dL   Alkaline phosphatase (APISO) 80 31 - 125 U/L   AST 11 10 - 30 U/L   ALT 14 6 - 29 U/L      Assessment & Plan:   1. Lymphadenopathy  - CBC with Differential/Platelet - Sedimentation rate - C-reactive protein - doxycycline (VIBRA-TABS) 100 MG tablet; Take 1 tablet (100 mg total) by mouth 2 (two) times daily for 7 days.  Dispense: 14 tablet; Refill: 0  2. Non-recurrent acute serous otitis media of both ears  - ofloxacin (FLOXIN OTIC) 0.3 % OTIC solution; Place 10 drops into both ears daily.  Dispense: 5 mL; Refill: 0   Follow up plan: Return if symptoms worsen or fail to improve.

## 2021-11-14 LAB — CBC WITH DIFFERENTIAL/PLATELET
Absolute Monocytes: 499 cells/uL (ref 200–950)
Basophils Absolute: 19 cells/uL (ref 0–200)
Basophils Relative: 0.2 %
Eosinophils Absolute: 134 cells/uL (ref 15–500)
Eosinophils Relative: 1.4 %
HCT: 40.1 % (ref 35.0–45.0)
Hemoglobin: 13.2 g/dL (ref 11.7–15.5)
Lymphs Abs: 3082 cells/uL (ref 850–3900)
MCH: 27.7 pg (ref 27.0–33.0)
MCHC: 32.9 g/dL (ref 32.0–36.0)
MCV: 84.2 fL (ref 80.0–100.0)
MPV: 10 fL (ref 7.5–12.5)
Monocytes Relative: 5.2 %
Neutro Abs: 5866 cells/uL (ref 1500–7800)
Neutrophils Relative %: 61.1 %
Platelets: 375 10*3/uL (ref 140–400)
RBC: 4.76 10*6/uL (ref 3.80–5.10)
RDW: 13.8 % (ref 11.0–15.0)
Total Lymphocyte: 32.1 %
WBC: 9.6 10*3/uL (ref 3.8–10.8)

## 2021-11-14 LAB — C-REACTIVE PROTEIN: CRP: 9.2 mg/L — ABNORMAL HIGH (ref <8.0)

## 2021-11-14 LAB — SEDIMENTATION RATE: Sed Rate: 22 mm/h — ABNORMAL HIGH (ref 0–20)

## 2021-11-19 DIAGNOSIS — Z419 Encounter for procedure for purposes other than remedying health state, unspecified: Secondary | ICD-10-CM | POA: Diagnosis not present

## 2021-11-21 ENCOUNTER — Other Ambulatory Visit: Payer: Self-pay | Admitting: Nurse Practitioner

## 2021-11-21 ENCOUNTER — Encounter: Payer: Self-pay | Admitting: Nurse Practitioner

## 2021-11-21 ENCOUNTER — Telehealth: Payer: Self-pay | Admitting: Nurse Practitioner

## 2021-11-21 ENCOUNTER — Ambulatory Visit: Payer: Self-pay | Admitting: *Deleted

## 2021-11-21 DIAGNOSIS — R591 Generalized enlarged lymph nodes: Secondary | ICD-10-CM

## 2021-11-21 NOTE — Telephone Encounter (Signed)
° ° °  Chief Complaint: Pt. States she finished antibiotic and still has lymp node back of her neck. Symptoms: No pain Frequency: 2 weeks ago Pertinent Negatives: Patient denies any other symptoms Disposition: [] ED /[] Urgent Care (no appt availability in office) / [] Appointment(In office/virtual)/ []  [facility]/ [] Home Care/ [] Refused Recommended Disposition /[] [facility]/ [x]  Follow-up with PCP Additional Notes: States PCP told her to call if lump did not resolve. Please advise pt.   Answer Assessment - Initial Assessment Questions 1. LOCATION: "Where is the swollen node located?" "Is the matching node on the other side of the body also swollen?"      Back of neck 2. SIZE: "How big is the node?" (e.g., inches or centimeters; or compared to common objects such as pea, bean, marble, golf ball)      Finger tip 3. ONSET: "When did the swelling start?"      2 weeks ago 4. NECK NODES: "Is there a sore throat, runny nose or other symptoms of a cold?"      No 5. GROIN OR ARMPIT NODES: "Is there a sore, scratch, cut or painful red area on that arm or leg?"      No 6. FEVER: "Do you have a fever?" If Yes, ask: "What is it, how was it measured, and when did it start?"      No 7. CAUSE: "What do you think is causing the swollen lymph nodes?"     Unsure 8. OTHER SYMPTOMS: "Do you have any other symptoms?"     No 9. PREGNANCY: "Is there any chance you are pregnant?" "When was your last menstrual period?"     No  Protocols used: Lymph Nodes - Swollen-A-AH

## 2021-11-21 NOTE — Telephone Encounter (Signed)
Summary: lumps on back of neck   Patient called in says was told to call Dr Zane Herald back if she still has the lumps on the back of her neck after taking antibiotics. She is wanting to know what to do next. Please call back     Attempted to reach patient- left message on VM to call office

## 2021-11-21 NOTE — Telephone Encounter (Signed)
Please advise. Patient stated still having issues with lymph nodes.

## 2021-11-21 NOTE — Telephone Encounter (Signed)
Patient notified

## 2021-11-21 NOTE — Telephone Encounter (Signed)
Patient called in says was told to call Dr Zane Herald back if she still has the lumps on the back of her neck after taking antibiotics. She is wanting to know what to do next. Please call back

## 2021-11-24 DIAGNOSIS — F411 Generalized anxiety disorder: Secondary | ICD-10-CM | POA: Diagnosis not present

## 2021-11-24 DIAGNOSIS — F900 Attention-deficit hyperactivity disorder, predominantly inattentive type: Secondary | ICD-10-CM | POA: Diagnosis not present

## 2021-11-24 DIAGNOSIS — F3181 Bipolar II disorder: Secondary | ICD-10-CM | POA: Diagnosis not present

## 2021-11-28 ENCOUNTER — Other Ambulatory Visit: Payer: Self-pay

## 2021-11-28 ENCOUNTER — Ambulatory Visit (INDEPENDENT_AMBULATORY_CARE_PROVIDER_SITE_OTHER): Payer: Medicaid Other | Admitting: Adult Health

## 2021-11-28 ENCOUNTER — Encounter: Payer: Self-pay | Admitting: Adult Health

## 2021-11-28 VITALS — BP 122/72 | HR 79 | Temp 97.3°F | Ht 61.0 in | Wt 197.8 lb

## 2021-11-28 DIAGNOSIS — G4719 Other hypersomnia: Secondary | ICD-10-CM | POA: Diagnosis not present

## 2021-11-28 DIAGNOSIS — R0683 Snoring: Secondary | ICD-10-CM | POA: Diagnosis not present

## 2021-11-28 NOTE — Assessment & Plan Note (Signed)
Excessive daytime sleepiness, snoring, restless sleep, witnessed episodes of gasping during sleep are all suspicious for possible underlying sleep apnea.  Patient will be set up for a home sleep study healthy sleep regimen was discussed  - discussed how weight can impact sleep and risk for sleep disordered breathing - discussed options to assist with weight loss: combination of diet modification, cardiovascular and strength training exercises   - had an extensive discussion regarding the adverse health consequences related to untreated sleep disordered breathing - specifically discussed the risks for hypertension, coronary artery disease, cardiac dysrhythmias, cerebrovascular disease, and diabetes - lifestyle modification discussed   - discussed how sleep disruption can increase risk of accidents, particularly when driving - safe driving practices were discussed    Plan  Patient Instructions  Set up for home sleep study  Healthy sleep regimen  Do not drive if sleepy  Healthy weight loss.  Follow up with in 6-8 weeks to discuss results and treatment plan

## 2021-11-28 NOTE — Patient Instructions (Signed)
Set up for home sleep study  Healthy sleep regimen  Do not drive if sleepy  Healthy weight loss.  Follow up with in 6-8 weeks to discuss results and treatment plan

## 2021-11-28 NOTE — Progress Notes (Signed)
@Patient  ID: Janet Rich, female    DOB: Aug 30, 1996, 26 y.o.   MRN: [id number]  Chief Complaint  Patient presents with   Consult    Referring provider: No ref. provider found  HPI: 26 year old female former smoker presents for sleep consult November 28, 2021 for snoring, daytime sleepiness and restless sleep Medical history significant for bipolar disorder, asthma and ADHD  TEST/EVENTS :   11/28/2021  Sleep Consult  Patient presents for a sleep consult.  Patient complains over the last 8 years that she has had daytime sleepiness, restless sleep, snoring.  Complains of episodes where she wakes up gasping for air.  Epworth score is 7.  Patient says that she typically goes to bed about 10 PM it usually takes about 1 hour to go to sleep and typically is up 3-4 times throughout the night and may get up between 7 AM and 10 AM.  She has never had a sleep study before.  Patient does not operate heavy machinery.  And says that her weight has been stable.  Patient says that no matter how much sleep she gets she always wakes up feeling tired and unrefreshed.  She always feels exhausted.  Denies any symptoms suspicious for cataplexy or sleep paralysis. Has minimum caffeine intake.  Patient is married.  Has 3 children under 90 years old.  She stays at home.  She quit smoking 11 years ago.  Has social alcohol intake.  No history of drug use.  She denies any energy drink or pills intake.  Family history is positive for schizophrenia, bipolar, diabetes, breast cancer, asthma.  Medical history is significant for bipolar disorder, asthma, GERD, vertigo, chronic allergies, ADHD, endometriosis, PCOS  Surgical history hysterectomy 2022, right ankle surgery history with ligament transplant Allergies  Allergen Reactions   Amoxicillin Shortness Of Breath and Nausea Only    Dizziness   Other     Must have anti-emetic prior to being given any narcotics    Latex Rash    Immunization History   Administered Date(s) Administered   Influenza,inj,Quad PF,6+ Mos 07/03/2020   Influenza-Unspecified 11/29/2017, 06/30/2021   Moderna Covid-19 Vaccine Bivalent Booster 40yrs & up 07/12/2021   PFIZER Comirnaty(Gray Top)Covid-19 Tri-Sucrose Vaccine 08/01/2020   PFIZER(Purple Top)SARS-COV-2 Vaccination 08/01/2020   Tdap 11/29/2017, 03/06/2019, 06/19/2020   Unspecified SARS-COV-2 Vaccination 01/02/2020, 01/31/2020   Varicella 08/16/2020    Past Medical History:  Diagnosis Date   Anemia    with periods   Ankle fracture    right   Anxiety    Asthma    Back pain    Bipolar 1 disorder (HCC)    Depression    Bipolar    Endometriosis    GERD (gastroesophageal reflux disease)    Headache    MIGRAINES   IBS (irritable bowel syndrome)    Ovarian cyst    PCOS (polycystic ovarian syndrome)    Pneumonia    age 31   PONV (postoperative nausea and vomiting)    nausea after wisdom teeth extraction   Scoliosis    Vaginal Pap smear, abnormal    bx ok    Tobacco History: Social History   Tobacco Use  Smoking Status Former   Types: Cigarettes   Quit date: 12/20/2009   Years since quitting: 11.9  Smokeless Tobacco Never  Tobacco Comments   age [age], quit   Counseling given: Not Answered Tobacco comments: age [age], quit   Outpatient Medications Prior to Visit  Medication Sig Dispense Refill   acetaminophen (TYLENOL)  500 MG tablet Take 2 tablets (1,000 mg total) by mouth every 6 (six) hours as needed for moderate pain. 30 tablet 0   albuterol (VENTOLIN HFA) 108 (90 Base) MCG/ACT inhaler Inhale 1-2 puffs into the lungs every 6 (six) hours as needed for wheezing or shortness of breath. 1 each 3   ALPRAZolam (XANAX) 0.25 MG tablet Take 0.25 mg by mouth at bedtime as needed for anxiety.     budesonide-formoterol (SYMBICORT) 80-4.5 MCG/ACT inhaler Inhale 2 puffs into the lungs 2 (two) times daily. 1 each 3   buPROPion (WELLBUTRIN XL) 150 MG 24 hr tablet Take 150 mg by mouth daily.      ibuprofen (ADVIL) 800 MG tablet Take 1 tablet (800 mg total) by mouth every 8 (eight) hours as needed. 30 tablet 2   lamoTRIgine (LAMICTAL) 25 MG tablet Take 25 mg by mouth daily.     lurasidone (LATUDA) 20 MG TABS tablet Take 1 tablet (20 mg total) by mouth daily. 90 tablet 3   ofloxacin (FLOXIN OTIC) 0.3 % OTIC solution Place 10 drops into both ears daily. 5 mL 0   omeprazole (PRILOSEC) 20 MG capsule Take 1 capsule (20 mg total) by mouth daily. 30 capsule 3   propranolol (INDERAL) 10 MG tablet Take 1 tablet (10 mg total) by mouth 2 (two) times daily. 60 tablet 1   gabapentin (NEURONTIN) 800 MG tablet Take 1 tablet (800 mg total) by mouth at bedtime for 14 days. Take nightly for 3 days, then up to 14 days as needed 14 tablet 0   No facility-administered medications prior to visit.     Review of Systems:   Constitutional:   No  weight loss, night sweats,  Fevers, chills,  +fatigue, or  lassitude.  HEENT:   No headaches,  Difficulty swallowing,  Tooth/dental problems, or  Sore throat,                No sneezing, itching, ear ache, + nasal congestion, post nasal drip,   CV:  No chest pain,  Orthopnea, PND, swelling in lower extremities, anasarca, dizziness, palpitations, syncope.   GI  No heartburn, indigestion, abdominal pain, nausea, vomiting, diarrhea, change in bowel habits, loss of appetite, bloody stools.   Resp: No shortness of breath with exertion or at rest.  No excess mucus, no productive cough,  No non-productive cough,  No coughing up of blood.  No change in color of mucus.  No wheezing.  No chest wall deformity  Skin: no rash or lesions.  GU: no dysuria, change in color of urine, no urgency or frequency.  No flank pain, no hematuria   MS:  No joint pain or swelling.  No decreased range of motion.  No back pain.    Physical Exam  BP 122/72 (BP Location: Left Arm, Patient Position: Sitting, Cuff Size: Normal)    Pulse 79    Temp (!) 97.3 F (36.3 C) (Oral)    Ht 5\' 1"   (1.549 m)    Wt 197 lb 12.8 oz (89.7 kg)    LMP 04/08/2021    SpO2 99%    BMI 37.37 kg/m   GEN: A/Ox3; pleasant , NAD, well nourished    HEENT:  [place]/AT,   NOSE-clear, THROAT-clear, no lesions, no postnasal drip or exudate noted.  Class II MP airway  NECK:  Supple w/ fair ROM; no JVD; normal carotid impulses w/o bruits; no thyromegaly or nodules palpated; no lymphadenopathy.    RESP  Clear  P &  A; w/o, wheezes/ rales/ or rhonchi. no accessory muscle use, no dullness to percussion  CARD:  RRR, no m/r/g, no peripheral edema, pulses intact, no cyanosis or clubbing.  GI:   Soft & nt; nml bowel sounds; no organomegaly or masses detected.   Musco: Warm bil, no deformities or joint swelling noted.   Neuro: alert, no focal deficits noted.    Skin: Warm, no lesions or rashes    Lab Results:  CBC     BNP No results found for: BNP  ProBNP No results found for: PROBNP  Imaging: No results found.    No flowsheet data found.  No results found for: NITRICOXIDE      Assessment & Plan:   Excessive daytime sleepiness Excessive daytime sleepiness, snoring, restless sleep, witnessed episodes of gasping during sleep are all suspicious for possible underlying sleep apnea.  Patient will be set up for a home sleep study healthy sleep regimen was discussed  - discussed how weight can impact sleep and risk for sleep disordered breathing - discussed options to assist with weight loss: combination of diet modification, cardiovascular and strength training exercises   - had an extensive discussion regarding the adverse health consequences related to untreated sleep disordered breathing - specifically discussed the risks for hypertension, coronary artery disease, cardiac dysrhythmias, cerebrovascular disease, and diabetes - lifestyle modification discussed   - discussed how sleep disruption can increase risk of accidents, particularly when driving - safe driving practices were  discussed    Plan  Patient Instructions  Set up for home sleep study  Healthy sleep regimen  Do not drive if sleepy  Healthy weight loss.  Follow up with in 6-8 weeks to discuss results and treatment plan         Rexene Edison, NP 11/28/2021

## 2021-11-28 NOTE — Progress Notes (Signed)
Reviewed and agree with assessment/plan.   Coralyn Helling, MD [facility] Pulmonary/Critical Care 11/28/2021, 2:21 PM Pager:  [phone]

## 2021-12-02 ENCOUNTER — Ambulatory Visit: Payer: Medicaid Other | Admitting: Licensed Clinical Social Worker

## 2021-12-02 DIAGNOSIS — F3181 Bipolar II disorder: Secondary | ICD-10-CM

## 2021-12-02 DIAGNOSIS — F411 Generalized anxiety disorder: Secondary | ICD-10-CM

## 2021-12-02 NOTE — Progress Notes (Signed)
Counselor/Therapist Progress Note  [name], MRN: [id number],    Date: 12/02/2021  Time Spent: 45 minutes    Treatment Type: Psychotherapy  Reported Symptoms: Isolation and withdrawal, Physical aches and pain, and low mood; anxiety, irritability  Mental Status Exam:  Appearance:   Casual     Behavior:  Appropriate and Sharing  Motor:  Normal  Speech/Language:   Clear and Coherent and Normal Rate  Affect:  Appropriate, Congruent, and Full Range  Mood:  normal  Thought process:  normal  Thought content:    WNL  Sensory/Perceptual disturbances:    WNL  Orientation:  oriented to person, place, time/date, and situation  Attention:  Good  Concentration:  Good  Memory:  WNL  Fund of knowledge:   Good  Insight:    Good  Judgment:   Good  Impulse Control:  Good   Risk Assessment: Danger to Self:  No Self-injurious Behavior: No Danger to Others: No Duty to Warn:no Physical Aggression / Violence:No  Access to Firearms a concern: No  Gang Involvement:No   Subjective: Patient was receptive to feedback and intervention from LCSW. She was engaged and cooperative throughout the session using time effectively to discuss thoughts and feelings. Patient is likely to benefit from future treatment because they remain motivated to decrease symptoms and reports benefit of regular sessions.      Interventions: Cognitive Behavioral Therapy, Person Centered  Established psychological safety. Checked in with patient regarding their week. LCSW assisted patient in processing their thoughts and emotions about what they have experienced with multiple stressors over the past week. LCSW validated patient's feelings of sadness and frustration and reviewed reframing thoughts and the importance of self-care. Provided support through active listening, validation of feelings, and highlighted patient's strengths.   Diagnosis:   ICD-10-CM   1. Bipolar 2 disorder (HCC)  F31.81     2.  Generalized anxiety disorder  F41.1       Plan: Patient's goal is: Help with anxiety and working through things. She reports benefit from having someone to listen to her.   Treatment Target: Increase realistic balanced thinking Explore patients thoughts, beliefs, automatic thoughts, assumptions Identify unhelpful thinking patterns Process distress and allow for emotional release Questioning and challenging thoughts Cognitive reappraisal   Treatment Target: Increase coping skills Mindfulness practices Intentional / Deep breathing STOP technique Grounding exercises Treatment Target: Reducing vulnerability to emotional mind Values clarification   Self-care - nutrition, sleep, exercise Increase living within values  Teach distress tolerance techniques - what helps me   Future Appointments  Date Time Provider Department Center  12/05/2021 11:15 AM Anson Fret, MD [facility] None  12/08/2021  3:30 PM Kathreen Cosier, LCSW [facility] None   Kathreen Cosier, LCSW

## 2021-12-05 ENCOUNTER — Encounter: Payer: Self-pay | Admitting: Neurology

## 2021-12-05 ENCOUNTER — Ambulatory Visit: Payer: Medicaid Other | Admitting: Neurology

## 2021-12-05 ENCOUNTER — Other Ambulatory Visit: Payer: Self-pay

## 2021-12-05 VITALS — BP 101/70 | HR 84 | Ht 61.0 in | Wt 198.0 lb

## 2021-12-05 DIAGNOSIS — R519 Headache, unspecified: Secondary | ICD-10-CM | POA: Diagnosis not present

## 2021-12-05 DIAGNOSIS — R51 Headache with orthostatic component, not elsewhere classified: Secondary | ICD-10-CM

## 2021-12-05 DIAGNOSIS — R42 Dizziness and giddiness: Secondary | ICD-10-CM | POA: Diagnosis not present

## 2021-12-05 DIAGNOSIS — G43009 Migraine without aura, not intractable, without status migrainosus: Secondary | ICD-10-CM | POA: Diagnosis not present

## 2021-12-05 DIAGNOSIS — H539 Unspecified visual disturbance: Secondary | ICD-10-CM | POA: Diagnosis not present

## 2021-12-05 MED ORDER — MECLIZINE HCL 25 MG PO TABS: 25.0000 mg | ORAL_TABLET | Freq: Three times a day (TID) | ORAL | 3 refills | Status: DC | PRN

## 2021-12-05 MED ORDER — METHYLPREDNISOLONE 4 MG PO TBPK: ORAL_TABLET | ORAL | 1 refills | Status: DC

## 2021-12-05 MED ORDER — ONDANSETRON 4 MG PO TBDP: 4.0000 mg | ORAL_TABLET | Freq: Three times a day (TID) | ORAL | 3 refills | Status: DC | PRN

## 2021-12-05 NOTE — Progress Notes (Signed)
Orthostatic VS: Lying BP 118/71, HR 69 Sitting BP 112/76, HR 77 (a little dizzy) Standing BP 123/81, HR 81 Standing at 3 min BP 113/80, HR 80

## 2021-12-05 NOTE — Patient Instructions (Addendum)
Vertigo has a VERY broad differential: Inner ear (infection or inflammation), labyrinthitis or neuronitis, stroke, lesion in the brain (schwannoma), Migraines "vestibular migraines", or orthostatic dizziness  MRI of the brain w/wo contrast with IAC protocol Medrol dosepak in case dizziness is residual from the infection and there is still inflammation Try Ondansetron or meclizine 3x a day for dizziness Vestibular Therapy Could this be "vestibular Migraines"? Less likely because prior to this only had 3 migraine days a month without dizziness however having headaches every  Benign Positional Vertigo Vertigo is the feeling that you or your surroundings are moving when they are not. Benign positional vertigo is the most common form of vertigo. This is usually a harmless condition (benign). This condition is positional. This means that symptoms are triggered by certain movements and positions. This condition can be dangerous if it occurs while you are doing something that could cause harm to yourself or others. This includes activities such as driving or operating machinery. What are the causes? The inner ear has fluid-filled canals that help your brain sense movement and balance. When the fluid moves, the brain receives messages about your body's position. With benign positional vertigo, calcium crystals in the inner ear break free and disturb the inner ear area. This causes your brain to receive confusing messages about your body's position. What increases the risk? You are more likely to develop this condition if: You are a woman. You are 62 years of age or older. You have recently had a head injury. You have an inner ear disease. What are the signs or symptoms? Symptoms of this condition usually happen when you move your head or your eyes in different directions. Symptoms may start suddenly and usually last for less than a minute. They include: Loss of balance and falling. Feeling like you are  spinning or moving. Feeling like your surroundings are spinning or moving. Nausea and vomiting. Blurred vision. Dizziness. Involuntary eye movement (nystagmus). Symptoms can be mild and cause only minor problems, or they can be severe and interfere with daily life. Episodes of benign positional vertigo may return (recur) over time. Symptoms may also improve over time. How is this diagnosed? This condition may be diagnosed based on: Your medical history. A physical exam of the head, neck, and ears. Positional tests to check for or stimulate vertigo. You may be asked to turn your head and change positions, such as going from sitting to lying down. A health care provider will watch for symptoms of vertigo. You may be referred to a health care provider who specializes in ear, nose, and throat problems (ENT or otolaryngologist) or a provider who specializes in disorders of the nervous system (neurologist). How is this treated? This condition may be treated in a session in which your health care provider moves your head in specific positions to help the displaced crystals in your inner ear move. Treatment for this condition may take several sessions. Surgery may be needed in severe cases, but this is rare. In some cases, benign positional vertigo may resolve on its own in 2-4 weeks. Follow these instructions at home: Safety Move slowly. Avoid sudden body or head movements or certain positions, as told by your health care provider. Avoid driving or operating machinery until your health care provider says it is safe. Avoid doing any tasks that would be dangerous to you or others if vertigo occurs. If you have trouble walking or keeping your balance, try using a cane for stability. If you feel dizzy or  unstable, sit down right away. Return to your normal activities as told by your health care provider. Ask your health care provider what activities are safe for you. General instructions Take  over-the-counter and prescription medicines only as told by your health care provider. Drink enough fluid to keep your urine pale yellow. Keep all follow-up visits. This is important. Contact a health care provider if: You have a fever. Your condition gets worse or you develop new symptoms. Your family or friends notice any behavioral changes. You have nausea or vomiting that gets worse. You have numbness or a prickling and tingling sensation. Get help right away if you: Have difficulty speaking or moving. Are always dizzy or faint. Develop severe headaches. Have weakness in your legs or arms. Have changes in your hearing or vision. Develop a stiff neck. Develop sensitivity to light. These symptoms may represent a serious problem that is an emergency. Do not wait to see if the symptoms will go away. Get medical help right away. Call your local emergency services (911 in the [place] Do not drive yourself to the hospital. Summary Vertigo is the feeling that you or your surroundings are moving when they are not. Benign positional vertigo is the most common form of vertigo. This condition is caused by calcium crystals in the inner ear that become displaced. This causes a disturbance in an area of the inner ear that helps your brain sense movement and balance. Symptoms include loss of balance and falling, feeling that you or your surroundings are moving, nausea and vomiting, and blurred vision. This condition can be diagnosed based on symptoms, a physical exam, and positional tests. Follow safety instructions as told by your health care provider and keep all follow-up visits. This is important. This information is not intended to replace advice given to you by your health care provider. Make sure you discuss any questions you have with your health care provider. Document Revised: 09/04/2020 Document Reviewed: 09/04/2020 [employer] Patient Education  2022 [employer] Vertigo Vertigo is the  feeling that you or your surroundings are moving when they are not. This feeling can come and go at any time. Vertigo often goes away on its own. Vertigo can be dangerous if it occurs while you are doing something that could endanger yourself or others, such as driving or operating machinery. Your health care provider will do tests to try to determine the cause of your vertigo. Tests will also help your health care provider decide how best to treat your condition. Follow these instructions at home: Eating and drinking   Dehydration can make vertigo worse. Drink enough fluid to keep your urine pale yellow. Do not drink alcohol. Activity Return to your normal activities as told by your health care provider. Ask your health care provider what activities are safe for you. In the morning, first sit up on the side of the bed. When you feel okay, stand slowly while you hold onto something until you know that your balance is fine. Move slowly. Avoid sudden body or head movements or certain positions, as told by your health care provider. If you have trouble walking or keeping your balance, try using a cane for stability. If you feel dizzy or unstable, sit down right away. Avoid doing any tasks that would cause danger to you or others if vertigo occurs. Avoid bending down if you feel dizzy. Place items in your home so that they are easy for you to reach without bending or leaning over. Do not drive  or use machinery if you feel dizzy. General instructions Take over-the-counter and prescription medicines only as told by your health care provider. Keep all follow-up visits. This is important. Contact a health care provider if: Your medicines do not relieve your vertigo or they make it worse. Your condition gets worse or you develop new symptoms. You have a fever. You develop nausea or vomiting, or if nausea gets worse. Your family or friends notice any behavioral changes. You have numbness or a prickling  and tingling sensation in part of your body. Get help right away if you: Are always dizzy or you faint. Develop severe headaches. Develop a stiff neck. Develop sensitivity to light. Have difficulty moving or speaking. Have weakness in your hands, arms, or legs. Have changes in your hearing or vision. These symptoms may represent a serious problem that is an emergency. Do not wait to see if the symptoms will go away. Get medical help right away. Call your local emergency services (911 in the [place] Do not drive yourself to the hospital. Summary Vertigo is the feeling that you or your surroundings are moving when they are not. Your health care provider will do tests to try to determine the cause of your vertigo. Follow instructions for home care. You may be told to avoid certain tasks, positions, or movements. Contact a health care provider if your medicines do not relieve your symptoms, or if you have a fever, nausea, vomiting, or changes in behavior. Get help right away if you have severe headaches or difficulty speaking, or you develop hearing or vision problems. This information is not intended to replace advice given to you by your health care provider. Make sure you discuss any questions you have with your health care provider. Document Revised: 09/04/2020 Document Reviewed: 09/04/2020 [employer] Patient Education  2022 [employer] Dizziness Dizziness is a common problem. It is a feeling of unsteadiness or light-headedness. You may feel like you are about to faint. Dizziness can lead to injury if you stumble or fall. Anyone can become dizzy, but dizziness is more common in older adults. This condition can be caused by a number of things, including medicines, dehydration, or illness. Follow these instructions at home: Eating and drinking  Drink enough fluid to keep your urine pale yellow. This helps to keep you from becoming dehydrated. Try to drink more clear fluids, such as water. Do  not drink alcohol. Limit your caffeine intake if told to do so by your health care provider. Check ingredients and nutrition facts to see if a food or beverage contains caffeine. Limit your salt (sodium) intake if told to do so by your health care provider. Check ingredients and nutrition facts to see if a food or beverage contains sodium. Activity  Avoid making quick movements. Rise slowly from chairs and steady yourself until you feel okay. In the morning, first sit up on the side of the bed. When you feel okay, stand slowly while you hold onto something until you know that your balance is good. If you need to stand in one place for a long time, move your legs often. Tighten and relax the muscles in your legs while you are standing. Do not drive or use machinery if you feel dizzy. Avoid bending down if you feel dizzy. Place items in your home so that they are easy for you to reach without leaning over. Lifestyle Do not use any products that contain nicotine or tobacco. These products include cigarettes, chewing tobacco, and vaping devices,  such as e-cigarettes. If you need help quitting, ask your health care provider. Try to reduce your stress level by using methods such as yoga or meditation. Talk with your health care provider if you need help to manage your stress. General instructions Watch your dizziness for any changes. Take over-the-counter and prescription medicines only as told by your health care provider. Talk with your health care provider if you think that your dizziness is caused by a medicine that you are taking. Tell a friend or a family member that you are feeling dizzy. If he or she notices any changes in your behavior, have this person call your health care provider. Keep all follow-up visits. This is important. Contact a health care provider if: Your dizziness does not go away or you have new symptoms. Your dizziness or light-headedness gets worse. You feel nauseous. You  have reduced hearing. You have a fever. You have neck pain or a stiff neck. Your dizziness leads to an injury or a fall. Get help right away if: You vomit or have diarrhea and are unable to eat or drink anything. You have problems talking, walking, swallowing, or using your arms, hands, or legs. You feel generally weak. You have any bleeding. You are not thinking clearly or you have trouble forming sentences. It may take a friend or family member to notice this. You have chest pain, abdominal pain, shortness of breath, or sweating. Your vision changes or you develop a severe headache. These symptoms may represent a serious problem that is an emergency. Do not wait to see if the symptoms will go away. Get medical help right away. Call your local emergency services (911 in the [place] Do not drive yourself to the hospital. Summary Dizziness is a feeling of unsteadiness or light-headedness. This condition can be caused by a number of things, including medicines, dehydration, or illness. Anyone can become dizzy, but dizziness is more common in older adults. Drink enough fluid to keep your urine pale yellow. Do not drink alcohol. Avoid making quick movements if you feel dizzy. Monitor your dizziness for any changes. This information is not intended to replace advice given to you by your health care provider. Make sure you discuss any questions you have with your health care provider. Document Revised: 09/09/2020 Document Reviewed: 09/09/2020 [employer] Patient Education  2022 [employer]

## 2021-12-05 NOTE — Progress Notes (Addendum)
[facility]    Provider:  Dr Lucia Gaskins Requesting Provider: [facility], Berniece Salines, FNP Primary Care Provider:  Berniece Salines, FNP  CC:  migraines and vertigo  HPI:  Janet Rich is a 26 y.o. female here as requested by Berniece Salines, FNP for new chief complaint migraines with vertigo.  I saw patient in 2021 for different symptoms Right sided facial pain, she has a past medical history of PCOS, depression, headache, back pain, bipolar 2, anxiety and trigeminal neuralgia, myalgia, elevated blood pressure without diagnosis of hypertension, preeclampsia and preterm labor with twins, IBS, GERD, obesity BMI 38, migraines, anxiety and depression.  At the time I saw her she was having some shooting pain into the proximal jaw on and off.  At that time she told us that she had shooting pain that radiated in front of the ear did not really shoot in the face just kind of hurt in front of the ear brief and severe but then pain lingers, she saw an ENT ongoing for several years, sensitive to touch ongoing daily, at the time she was [redacted] weeks pregnant with twins and she reported decreased hearing on the right side.  Thorough neurologic and physical examination were normal.  We ordered an MRI of the brain and started a Medrol Dosepak at the time, we referred to ENT, patient did not follow-up with [place].  She was referred here by Dr. Zane Herald and I reviewed her notes from November 10, 2021, patient reported she has had vertigo for a week, migraines/headaches, especially in the morning, when she moves her head she gets dizzy, she had this in the past and was seen at ENT and taught the Epley maneuvers, at that point she was referred to neurology and had an MRI as above, she was supposed to return for an MRI with contrast after the birth of her girls but she did not, she says she takes ibuprofen for migraines, it usually works for her, discussed that there are  other treatments for migraine she may want to look into and she agreed to come to neurology.  Patient has a lot of dizziness, positional headaches and migraines, vision changes, can't bend over or move fast makes it worse, room is spinnng, worse in the morning when supine with headaches, she has not been performing her epley maneuvers. She is dizzy every day. Vertigo has been severe. She had vertigo In the past but nothing like this. She is having vision changes, recommended eye doctor (floaters), she has headaches, pulsating/pounding/throbbing with nausea and light sensitivity, her ear on the right is painful, started with an inner ear infection treated with antibiotics and ear drops. Also having daily chronic headaches and migraines for the last month but usually has a lot of migraines the month. Prior to this only 3 days a month of migraines. She drinks 3 bottles water a day. No weakness. No other focal neurologic deficits, associated symptoms, inciting events or modifiable factors.  Reviewed notes, labs and imaging from outside physicians, which showed:  09/2021: BUN 10, creat 0.59  MRI brain 05/16/2020: MRI brain (without) demonstrating: personally reviewed imaging and agree - Tiny vessels noted near the right trigeminal nerve cisternal segment.  - Brain parenchyma is otherwise unremarkable.  Personally reviewed and agree.    From a thorough review of records: Medications tried that can be used in migraine management include: Tylenol, amitriptyline, aspirin, Flexeril, Decadron, Benadryl, diclofenac tablets, gabapentin, ibuprofen, Tylenol, Aleve, ketorolac  injections, Lamictal, lidocaine injections, meclizine, Mobic tablets, methylprednisolone, Reglan tablets, Mobic, nifedipine (calcium channel blocker similar to verapamil), propranolol contraindicated due to asthma, Zofran, prednisone tablets, Compazine injections, Phenergan tablets and suppositories and injections, Seroquel, Zoloft, Imitrex,  tramadol,worse with standing and changing positions.   Review of Systems: Patient complains of symptoms per HPI as well as the following symptoms worsening headaches. Pertinent negatives and positives per HPI. All others negative.   Social History   Socioeconomic History   Marital status: Married    Spouse name: Renae Fickle   Number of children: 3   Years of education: Not on file   Highest education level: Associate degree: academic program  Occupational History   Occupation: unemployeed   Tobacco Use   Smoking status: Former    Types: Cigarettes    Quit date: 12/20/2009    Years since quitting: 11.9   Smokeless tobacco: Never   Tobacco comments:    age [age], quit  Vaping Use   Vaping Use: Never used  Substance and Sexual Activity   Alcohol use: Yes    Comment: occasionally   Drug use: No   Sexual activity: Yes    Partners: Female, Female    Birth control/protection: Surgical    Comment: hysterectomy  Other Topics Concern   Not on file  Social History Narrative   Lives at home with husband, son, and 54 m.o. twin girls    Right handed   Currently [redacted] weeks pregnant with twins (as of 01/17/2020)  ---update 12/05/2021 twins are now 28 months old    Caffeine: every once in awhile    Social Determinants of Health   Financial Resource Strain: Not on file  Food Insecurity: Not on file  Transportation Needs: Not on file  Physical Activity: Not on file  Stress: Not on file  Social Connections: Not on file  Intimate Partner Violence: Not on file    Family History  Problem Relation Age of Onset   Endometriosis Mother    Bipolar disorder Mother    Diabetes Mother        prediabetes   Breast cancer Paternal Grandmother    Cancer Paternal Grandmother    Depression Paternal Grandmother    Schizophrenia Father    Depression Father    Heart disease Neg Hx    Colon cancer Neg Hx    Ovarian cancer Neg Hx     Past Medical History:  Diagnosis Date   Anemia    with periods    Ankle fracture    right   Anxiety    Asthma    Back pain    Bipolar 1 disorder (HCC)    Depression    Bipolar    Endometriosis    GERD (gastroesophageal reflux disease)    Headache    MIGRAINES   IBS (irritable bowel syndrome)    Ovarian cyst    PCOS (polycystic ovarian syndrome)    Pneumonia    age 70   PONV (postoperative nausea and vomiting)    nausea after wisdom teeth extraction   Scoliosis    Vaginal Pap smear, abnormal    bx ok    Patient Active Problem List   Diagnosis Date Noted   Excessive daytime sleepiness 11/28/2021   Endometriosis determined by laparoscopy 05/08/2021   Myalgia 03/11/2021   Elevated BP without diagnosis of hypertension 01/08/2021   Dichorionic diamniotic twin pregnancy in third trimester 08/14/2020   Preeclampsia 08/14/2020   Twin pregnancy delivered vaginally 08/14/2020   Preterm labor  06/17/2020   Right trigeminal neuralgia 11/08/2019   Endometriosis 03/27/2019   PCOS (polycystic ovarian syndrome) 03/27/2019   Moderate persistent asthma 03/07/2019   Post-nasal drainage 03/07/2019   Irritable bowel syndrome with diarrhea 03/07/2019   Gastroesophageal reflux disease without esophagitis 03/07/2019   Class 2 obesity due to excess calories without serious comorbidity with body mass index (BMI) of 38.0 to 38.9 in adult 09/02/2018   Bipolar 2 disorder (HCC) 08/12/2018   History of low birth weight 03/11/2018   Vulvar atrophy 03/11/2018   History of migraine headaches 05/01/2015   Anxiety and depression 05/01/2015    Past Surgical History:  Procedure Laterality Date   FRACTURE SURGERY     ankle surgery with impant    HYSTEROSCOPY WITH D & C N/A 09/22/2019   Procedure: DILATATION AND CURETTAGE /HYSTEROSCOPY;  Surgeon: Ward, Elenora Fenderhelsea C, MD;  Location: [facility] ORS;  Service: Gynecology;  Laterality: N/A;   LYSIS OF ADHESION N/A 05/08/2021   Procedure: LYSIS OF ADHESION, EXCISION OF ENDOMETRIOSIS;  Surgeon: Christeen DouglasBeasley, Bethany, MD;  Location: [facility]  ORS;  Service: Gynecology;  Laterality: N/A;   ROBOTIC ASSISTED DIAGNOSTIC LAPAROSCOPY N/A 02/18/2017   Procedure: ROBOTIC ASSISTED DIAGNOSTIC LAPAROSCOPY,EXCISION AND ABLATION OF ENDOMETRIOSIS,LYSIS OF ADHESIONS;  Surgeon: Olivia Mackieichard Taavon, MD;  Location: [facility];  Service: Gynecology;  Laterality: N/A;   ROBOTIC ASSISTED LAPAROSCOPIC HYSTERECTOMY AND SALPINGECTOMY Bilateral 05/08/2021   Procedure: XI ROBOTIC ASSISTED TOTAL LAPAROSCOPIC HYSTERECTOMY AND SALPINGECTOMY;  Surgeon: Christeen DouglasBeasley, Bethany, MD;  Location: [facility] ORS;  Service: Gynecology;  Laterality: Bilateral;   VAGINAL DELIVERY N/A 08/14/2020   Procedure: VAGINAL DELIVERY;  Surgeon: Feliberto GottronSchermerhorn, Ihor Austinhomas J, MD;  Location: [facility] ORS;  Service: Obstetrics;  Laterality: N/A;   WISDOM TOOTH EXTRACTION  2013    Current Outpatient Medications  Medication Sig Dispense Refill   acetaminophen (TYLENOL) 500 MG tablet Take 2 tablets (1,000 mg total) by mouth every 6 (six) hours as needed for moderate pain. 30 tablet 0   albuterol (VENTOLIN HFA) 108 (90 Base) MCG/ACT inhaler Inhale 1-2 puffs into the lungs every 6 (six) hours as needed for wheezing or shortness of breath. 1 each 3   ALPRAZolam (XANAX) 0.25 MG tablet Take 0.25 mg by mouth at bedtime as needed for anxiety.     budesonide-formoterol (SYMBICORT) 80-4.5 MCG/ACT inhaler Inhale 2 puffs into the lungs 2 (two) times daily. 1 each 3   buPROPion (WELLBUTRIN XL) 150 MG 24 hr tablet Take 300 mg by mouth daily.     gabapentin (NEURONTIN) 300 MG capsule Take 300 mg by mouth daily.     ibuprofen (ADVIL) 800 MG tablet Take 1 tablet (800 mg total) by mouth every 8 (eight) hours as needed. 30 tablet 2   lamoTRIgine (LAMICTAL) 200 MG tablet Take 200 mg by mouth at bedtime.     lurasidone (LATUDA) 20 MG TABS tablet Take 1 tablet (20 mg total) by mouth daily. 90 tablet 3   meclizine (ANTIVERT) 25 MG tablet Take 1 tablet (25 mg total) by mouth 3 (three) times daily as needed for dizziness. 30 tablet 3    methylPREDNISolone (MEDROL DOSEPAK) 4 MG TBPK tablet Take pills daily all together with food. Take the first dose (6 pills) as soon as possible. Take the rest each morning. For 6 days total 6-5-4-3-2-1. 21 tablet 1   ofloxacin (FLOXIN OTIC) 0.3 % OTIC solution Place 10 drops into both ears daily. 5 mL 0   omeprazole (PRILOSEC) 20 MG capsule Take 1 capsule (20 mg total) by mouth daily. 30 capsule  3   ondansetron (ZOFRAN-ODT) 4 MG disintegrating tablet Take 1-2 tablets (4-8 mg total) by mouth every 8 (eight) hours as needed. For nausea or dizziness 30 tablet 3   propranolol (INDERAL) 10 MG tablet Take 1 tablet (10 mg total) by mouth 2 (two) times daily. (Patient taking differently: Take 20 mg by mouth 2 (two) times daily.) 60 tablet 1   No current facility-administered medications for this visit.    Allergies as of 12/05/2021 - Review Complete 12/05/2021  Allergen Reaction Noted   Amoxicillin Shortness Of Breath and Nausea Only 11/13/2019   Other  02/10/2017   Latex Rash 05/01/2015    Vitals: BP 101/70 (BP Location: Right Arm, Patient Position: Sitting, Cuff Size: Large)    Pulse 84    Ht 5\' 1"  (1.549 m)    Wt 198 lb (89.8 kg)    LMP 04/08/2021    BMI 37.41 kg/m  Last Weight:  Wt Readings from Last 1 Encounters:  12/05/21 198 lb (89.8 kg)   Last Height:   Ht Readings from Last 1 Encounters:  12/05/21 5\' 1"  (1.549 m)     Physical exam: Exam: Gen: NAD, conversant, well nourised, obese, well groomed                     CV: RRR, no MRG. No Carotid Bruits. No peripheral edema, warm, nontender Eyes: Conjunctivae clear without exudates or hemorrhage  Neuro: Detailed Neurologic Exam  Speech:    Speech is normal; fluent and spontaneous with normal comprehension.  Cognition:    The patient is oriented to person, place, and time;     recent and remote memory intact;     language fluent;     normal attention, concentration,     fund of knowledge Cranial Nerves:    The pupils are  equal, round, and reactive to light. The fundi are flat. Visual fields are full to finger confrontation. Extraocular movements are intact. Trigeminal sensation is intact and the muscles of mastication are normal. The face is symmetric. The palate elevates in the midline. Hearing intact. Voice is normal. Shoulder shrug is normal. The tongue has normal motion without fasciculations.   Coordination:    Normal finger to nose and heel to shin.  Gait:    normal.   Motor Observation:    No asymmetry, no atrophy, and no involuntary movements noted. Tone:    Normal muscle tone.    Posture:    Posture is normal. normal erect    Strength:    Strength is V/V in the upper and lower limbs.      Sensation: intact to LT     Reflex Exam:  DTR's:    Deep tendon reflexes in the upper and lower extremities are normal bilaterally.   Toes:    The toes are downgoing bilaterally.   Clonus:    Clonus is absent.    Assessment/Plan:  26 y.o. female here as requested by , FNP for new chief complaint migraines with vertigo.  I saw patient in 2021 for different symptoms Right sided facial pain, she has a past medical history of PCOS, depression, headache, back pain, bipolar 2, anxiety and trigeminal neuralgia, myalgia, elevated blood pressure without diagnosis of hypertension, preeclampsia and preterm labor with twins, IBS, GERD, obesity BMI 38, migraines, anxiety and depression. Patient's blood pressure is also very low today, increase fluids and salt intake and follow up with primary care for orthostatics (101/70 today)  MRI of the  brain w/wo contrast with IAC protocol: MRI brain due to concerning symptoms of morning headaches, positional headaches,vision changes, worsening headaches, vertigo and dizziness  to look for space occupying mass, chiari or intracranial hypertension (pseudotumor), schwannoma or IAC lesions, brainstem disease, strokes or other  Vertigo and headaches have a VERY broad  differential: Inner ear (infection or inflammation), labyrinthitis or neuronitis, stroke, lesion in the brain (schwannoma), Migraines "vestibular migraines", or orthostatic dizziness and others  Medrol dosepak in case dizziness is residual from the ear infection and there is still inflammation Try Ondansetron or meclizine 3x a day for dizziness Vestibular Therapy  Could this be "vestibular Migraines"? Less likely because prior to this month she only had 3 migraine days a month treated with OTC meds mild without dizziness and never had dizziness/vertigo with a migraine. People with "vestibular migraines" can absolutely have dizziness/vertigo without head pain but the criteria also states they also have migraines WITH the dizziness/vertigo(she never has). Alternatively we do know people with migraines have more vertigo in general than others.    Orders Placed This Encounter  Procedures   MR BRAIN W WO CONTRAST   Ambulatory referral to Physical Therapy   Meds ordered this encounter  Medications   methylPREDNISolone (MEDROL DOSEPAK) 4 MG TBPK tablet    Sig: Take pills daily all together with food. Take the first dose (6 pills) as soon as possible. Take the rest each morning. For 6 days total 6-5-4-3-2-1.    Dispense:  21 tablet    Refill:  1   ondansetron (ZOFRAN-ODT) 4 MG disintegrating tablet    Sig: Take 1-2 tablets (4-8 mg total) by mouth every 8 (eight) hours as needed. For nausea or dizziness    Dispense:  30 tablet    Refill:  3   meclizine (ANTIVERT) 25 MG tablet    Sig: Take 1 tablet (25 mg total) by mouth 3 (three) times daily as needed for dizziness.    Dispense:  30 tablet    Refill:  3    Cc: No ref. provider found,  Berniece SalinesPender, Julie F, FNP  Naomie DeanAntonia Rees Matura, MD  [facility] [address] [place], [place] [id number]  Phone [phone] Fax [phone]

## 2021-12-07 ENCOUNTER — Encounter: Payer: Self-pay | Admitting: Neurology

## 2021-12-08 ENCOUNTER — Ambulatory Visit: Payer: Medicaid Other | Admitting: Licensed Clinical Social Worker

## 2021-12-08 DIAGNOSIS — F3181 Bipolar II disorder: Secondary | ICD-10-CM

## 2021-12-08 DIAGNOSIS — F411 Generalized anxiety disorder: Secondary | ICD-10-CM

## 2021-12-08 NOTE — Progress Notes (Signed)
Counselor/Therapist Progress Note  [name], MRN: [id number],    Date: 12/08/2021  Time Spent: 45 minutes    Treatment Type: Psychotherapy  Reported Symptoms: Physical aches and pain and low mood, anxiety, overwhelm, stress  Mental Status Exam:  Appearance:   Casual     Behavior:  Appropriate and Sharing  Motor:  Normal  Speech/Language:   Clear and Coherent and Normal Rate  Affect:  Appropriate, Congruent, and Full Range  Mood:  normal  Thought process:  normal  Thought content:    WNL  Sensory/Perceptual disturbances:    WNL  Orientation:  oriented to person, place, time/date, and situation  Attention:  Good  Concentration:  Good  Memory:  WNL  Fund of knowledge:   Good  Insight:    Good  Judgment:   Good  Impulse Control:  Good   Risk Assessment: Danger to Self:  No Self-injurious Behavior: No Danger to Others: No Duty to Warn:no Physical Aggression / Violence:No  Access to Firearms a concern: No  Gang Involvement:No   Subjective: Patient was receptive to feedback and intervention from LCSW. Patient was engaged and cooperative throughout the session using time effectively to discuss thoughts and feelings and discuss coping strategies. Patient is likely to benefit from future treatment because she remains motivated to manage symptoms and improve functioning and reports benefit of regular sessions in addressing these symptoms.    Interventions: Cognitive Behavioral Therapy Established psychological safety. Checked in with patient regarding her week. Engaged patient in processing current psychosocial stressors, continued mood and anxiety issues due to multiple stressors- financial stressors and health challenges.Lead patient though thought two ACT exercise, Pain in My head and Pushing against my thoughts and feelings (writing out two problems on a piece of paper-pushing it away, holding it close, and in a comfortable position). Provided support through  active listening, validation of feelings, and highlighted patient's strengths.   Diagnosis:   ICD-10-CM   1. Generalized anxiety disorder  F41.1     2. Bipolar 2 disorder (HCC)  F31.81       Plan: Patient's goal is: Help with anxiety and working through things. She reports benefit from having someone to listen to her.   Treatment Target: Increase realistic balanced thinking Explore patients thoughts, beliefs, automatic thoughts, assumptions Identify unhelpful thinking patterns Process distress and allow for emotional release Questioning and challenging thoughts Cognitive reappraisal   Treatment Target: Increase coping skills Mindfulness practices Acceptance practices  Intentional / Deep breathing STOP technique Grounding exercises Treatment Target: Reducing vulnerability to emotional mind Values clarification   Self-care - nutrition, sleep, exercise Increase living within values  Teach distress tolerance techniques - what helps me   Future Appointments  Date Time Provider Department Center  12/10/2021  7:15 AM Tempie Donning, PT [facility]  12/22/2021  2:00 PM Kathreen Cosier, LCSW [facility] None     Kathreen Cosier, LCSW

## 2021-12-09 ENCOUNTER — Telehealth: Payer: Self-pay | Admitting: Neurology

## 2021-12-09 NOTE — Telephone Encounter (Signed)
[facility] pending faxed notes.  °

## 2021-12-10 ENCOUNTER — Ambulatory Visit: Payer: Medicaid Other | Attending: Neurology

## 2021-12-10 ENCOUNTER — Other Ambulatory Visit: Payer: Self-pay

## 2021-12-10 DIAGNOSIS — R42 Dizziness and giddiness: Secondary | ICD-10-CM | POA: Insufficient documentation

## 2021-12-10 DIAGNOSIS — R2681 Unsteadiness on feet: Secondary | ICD-10-CM | POA: Diagnosis not present

## 2021-12-10 NOTE — Telephone Encounter (Signed)
[facility] Harvey: [id number] (exp. 12/09/21 to 02/07/22) order sent to GI, they will reach out to the patient to schedule.

## 2021-12-10 NOTE — Therapy (Signed)
[facility] [name] [place], [place], [address] Phone: [phone]   Fax:  [phone]  Physical Therapy Evaluation  Patient Details  Name: Marvel Planaylor Sheller MRN: [id number] Date of Birth: [birth year] Referring Provider (PT): Naomie DeanAntonia Ahern, MD   Encounter Date: 12/10/2021   PT End of Session - 12/10/21 0723     Visit Number 1    Number of Visits 7    Date for PT Re-Evaluation --   at 7th visit   Authorization Type [employer] MCD (VL [id number])    Authorization Time Period awaiting authorization    PT Start Time 0724   pt arriving late   PT Stop Time 0804    PT Time Calculation (min) 40 min    Activity Tolerance Patient tolerated treatment well    Behavior During Therapy WFL for tasks assessed/performed             Past Medical History:  Diagnosis Date   Anemia    with periods   Ankle fracture    right   Anxiety    Asthma    Back pain    Bipolar 1 disorder (HCC)    Depression    Bipolar    Endometriosis    GERD (gastroesophageal reflux disease)    Headache    MIGRAINES   IBS (irritable bowel syndrome)    Ovarian cyst    PCOS (polycystic ovarian syndrome)    Pneumonia    age 12   PONV (postoperative nausea and vomiting)    nausea after wisdom teeth extraction   Scoliosis    Vaginal Pap smear, abnormal    bx ok    Past Surgical History:  Procedure Laterality Date   FRACTURE SURGERY     ankle surgery with impant    HYSTEROSCOPY WITH D & C N/A 09/22/2019   Procedure: DILATATION AND CURETTAGE /HYSTEROSCOPY;  Surgeon: Ward, Elenora Fenderhelsea C, MD;  Location: [facility];  Service: Gynecology;  Laterality: N/A;   LYSIS OF ADHESION N/A 05/08/2021   Procedure: LYSIS OF ADHESION, EXCISION OF ENDOMETRIOSIS;  Surgeon: Christeen DouglasBeasley, Bethany, MD;  Location: [facility];  Service: Gynecology;  Laterality: N/A;   ROBOTIC ASSISTED DIAGNOSTIC LAPAROSCOPY N/A 02/18/2017   Procedure: ROBOTIC ASSISTED DIAGNOSTIC LAPAROSCOPY,EXCISION AND  ABLATION OF ENDOMETRIOSIS,LYSIS OF ADHESIONS;  Surgeon: Olivia Mackieichard Taavon, MD;  Location: [facility];  Service: Gynecology;  Laterality: N/A;   ROBOTIC ASSISTED LAPAROSCOPIC HYSTERECTOMY AND SALPINGECTOMY Bilateral 05/08/2021   Procedure: XI ROBOTIC ASSISTED TOTAL LAPAROSCOPIC HYSTERECTOMY AND SALPINGECTOMY;  Surgeon: Christeen DouglasBeasley, Bethany, MD;  Location: [facility];  Service: Gynecology;  Laterality: Bilateral;   VAGINAL DELIVERY N/A 08/14/2020   Procedure: VAGINAL DELIVERY;  Surgeon: Feliberto GottronSchermerhorn, Ihor Austinhomas J, MD;  Location: [facility];  Service: Obstetrics;  Laterality: N/A;   WISDOM TOOTH EXTRACTION  2013    There were no vitals filed for this visit.    Subjective Assessment - 12/10/21 0726     Subjective Patient reports that she began to have Vertigo a couple years ago, but has now had it periodically. Reports the last bout has lasted approx 2 1/2 weeks. Patient reports anxious due to drive here, reports this increased her symptoms. Reports she feels foggy headed, and feels as her head is tipping. Reports history of migraines, happens typically around once a month. Denies hearing changes or ringing in the ears, reports sometimes she hears a pop in the ear. Patient reports that looking side to side makes her dizziness. Tries to sit straight forward. No dizziness with bed mobility.  Pt reports she can feel off balance in the morning. Currently taking Meclizine daily, approx 2-3x/daily. Denies concussion. Had a recent ear infection, currently taking a steroid medication for the ear infection. Patient reports that the dizziness leads into a headache with light and noise sensitivity. Scheduled to see ENT on March 8th, has MRI referral but this is not scheduled.    Pertinent History Anemia, R Ankle Fx, Anxiety, Asthma, Bipolar, GERD, IBS, Scoliosis, Back Pain, Trigeminal Neuralgia, Depression    Patient Stated Goals resources other than medications    Currently in Pain? No/denies                [facility] PT  Assessment - 12/10/21 0001       Assessment   Medical Diagnosis Vertigo    Referring Provider (PT) Naomie Dean, MD    Onset Date/Surgical Date 12/05/21   referral date; first episode 2018   Hand Dominance Right    Prior Therapy OPPT for pelvic health.      Precautions   Precautions None      Restrictions   Weight Bearing Restrictions No      Balance Screen   Has the patient fallen in the past 6 months No      Home Environment   Living Environment Private residence    Living Arrangements Children;Spouse/significant other    Available Help at Discharge Family    Type of Home House    Home Access Stairs to enter    Entrance Stairs-Number of Steps 4    Entrance Stairs-Rails Cannot reach both    Home Layout One level    Home Equipment None      Prior Function   Level of Independence Independent    Vocation Other (comment)   Working on Animator; 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   Leisure Writing, hanging out with family      Cognition   Overall Cognitive Status Within Functional Limits for tasks assessed      Observation/Other Assessments   Focus on Therapeutic Outcomes (FOTO)  DPS: 45.1, DFS: 38.5      Sensation   Light Touch Appears Intact      Coordination   Gross Motor Movements are Fluid and Coordinated Yes      ROM / Strength   AROM / PROM / Strength Strength      Strength   Overall Strength Within functional limits for tasks performed      Transfers   Transfers Sit to Stand;Stand to Sit    Sit to Stand 7: Independent    Stand to Sit 7: Independent      Ambulation/Gait   Ambulation/Gait Yes    Ambulation/Gait Assistance 7: Independent    Assistive device None    Gait Pattern Within Functional Limits    Ambulation Surface Level;Indoor      High Level Balance   High Level Balance Comments Completed M-CTSIB: situation 1-3 for full 30 seconds; situation 4: 30 seconds but increased postural sway.              Vestibular Assessment - 12/10/21  0001       Symptom Behavior   Subjective history of current problem See Subjective.    Type of Dizziness  Unsteady with head/body turns;Spinning;Imbalance;Lightheadedness    Frequency of Dizziness daily    Duration of Dizziness last episodes lasted approx 2-3 weeks    Symptom Nature Motion provoked;Intermittent    Aggravating Factors Looking up to the ceiling;Turning head quickly;Turning body  quickly;Forward bending    Relieving Factors Rest;Slow movements    Progression of Symptoms No change since onset      Oculomotor Exam   Oculomotor Alignment Normal    Ocular ROM WNL    Spontaneous Absent    Gaze-induced  Absent    Smooth Pursuits Intact   reports blurred vision with tracking to the R and Vertical   Saccades Intact   reports mild dizziness with horizontal saccades to the R; increased HA/pain in anterior forehead (between eyes)     Oculomotor Exam-Fixation Suppressed    Left Head Impulse Negative    Right Head Impulse Positive      Vestibulo-Ocular Reflex   VOR 1 Head Only (x 1 viewing) Able to maintain eyes focused; mild dizziness    VOR Cancellation Normal      Visual Acuity   Static 9    Dynamic 7   mild dizziness     Positional Testing   Dix-Hallpike Dix-Hallpike Right;Dix-Hallpike Left    Horizontal Canal Testing Horizontal Canal Right;Horizontal Canal Left      Dix-Hallpike Right   Dix-Hallpike Right Duration 0   no nystagmus, but reports increased sensation of dizziness. denies spinning   Dix-Hallpike Right Symptoms No nystagmus      Dix-Hallpike Left   Dix-Hallpike Left Duration 0    Dix-Hallpike Left Symptoms No nystagmus      Horizontal Canal Right   Horizontal Canal Right Duration 0    Horizontal Canal Right Symptoms Normal      Horizontal Canal Left   Horizontal Canal Left Duration 0    Horizontal Canal Left Symptoms Normal      Positional Sensitivities   Sit to Supine No dizziness    Supine to Left Side No dizziness    Supine to Right Side No  dizziness    Supine to Sitting No dizziness    Right Hallpike Lightheadedness    Up from Right Hallpike Moderate dizziness    Up from Left Hallpike No dizziness    Nose to Right Knee Mild dizziness   completed from standing position   Right Knee to Sitting No dizziness    Nose to Left Knee Severe dizziness   completed from standing position   Left Knee to Sitting No dizziness    Head Turning x 5 Severe dizziness    Head Nodding x 5 Mild dizziness    Pivot Right in Standing No dizziness    Pivot Left in Standing No dizziness    Rolling Right No dizziness    Rolling Left No dizziness                Objective measurements completed on examination: See above findings.       PT Education - 12/10/21 0731     Education Details Educated on [employer]; Differential Diagnosis of Vestibular Migraine vs. Hypofunction    Person(s) Educated Patient    Methods Explanation    Comprehension Verbalized understanding              PT Short Term Goals - 12/10/21 0818       PT SHORT TERM GOAL #1   Title Pt will be independent with initial vestibular/balance HEP (ALL STGs Due: at 4th Visit)    Baseline no HEP Established    Time 3    Period --   visits   Status New      PT SHORT TERM GOAL #2   Title SOT TBA and LTG to be  set as applicable    Baseline TBA    Time 3    Period --   visits   Status New               PT Long Term Goals - 12/10/21 0818       PT LONG TERM GOAL #1   Title Pt will be independent with final and progressive vestibular/balance HEP (ALL LTGs Due at 7th Visit)    Baseline No HEP established    Time 6    Period --   visits   Status New      PT LONG TERM GOAL #2   Title LTG to be set for SOT    Baseline TBA    Time 6    Period --   visits   Status New      PT LONG TERM GOAL #3   Title Pt will improve al components of MSQ to </= 2/5 for improvements in dizziness and activity tolerance    Baseline 3-4/5    Time 6    Period --    visits   Status New      PT LONG TERM GOAL #4   Title Pt will report being able to bend over and pick up children with no dizziness to demo improved tolerance for activity    Baseline mod dizziness with bending    Time 6    Period --   visits   Status New      PT LONG TERM GOAL #5   Title Pt will improve DFS to >/= 45    Baseline 38.1    Time 6    Period --   visits   Status New                    Plan - 12/10/21 0809     Clinical Impression Statement Pt is a 26 y.o. female referred to Neuro OPPT services for Vertigo. Patient's PMH significant for the following: Anemia, R Ankle Fx, Anxiety, Asthma, Bipolar, GERD, IBS, Scoliosis, Back Pain, Trigeminal Neuralgia, Depression. Patient presents with the following impairments upon evaluation: dizziness, positive HIT and mod dizziness indicating VOR deficit, decreased balance, and increased motion sensitivity. Pt will benefit from skilled PT services to address impairments and improved activity tolerance for functional activities.    Personal Factors and Comorbidities Comorbidity 3+;Time since onset of injury/illness/exacerbation    Comorbidities Anemia, R Ankle Fx, Anxiety, Asthma, Bipolar, GERD, IBS, Scoliosis, Back Pain, Trigeminal Neuralgia, Depression    Stability/Clinical Decision Making Stable/Uncomplicated    Clinical Decision Making Low    Rehab Potential Good    PT Frequency 1x / week    PT Duration 6 weeks    PT Treatment/Interventions ADLs/Self Care Home Management;Neuromuscular re-education;Balance training;Therapeutic exercise;Therapeutic activities;Functional mobility training;Stair training;Gait training;Patient/family education;Manual techniques;Dry needling;Canalith Repostioning;Moist Heat;Cryotherapy;Vestibular;Joint Manipulations    PT Next Visit Plan Perform SOT; Initiate VOR and Habituation HEP    Consulted and Agree with Plan of Care Patient             Patient will benefit from skilled therapeutic  intervention in order to improve the following deficits and impairments:  Decreased balance, Dizziness, Decreased activity tolerance, Pain  Visit Diagnosis: Dizziness and giddiness  Unsteadiness on feet     Problem List Patient Active Problem List   Diagnosis Date Noted   Excessive daytime sleepiness 11/28/2021   Endometriosis determined by laparoscopy 05/08/2021   Myalgia 03/11/2021   Elevated BP without diagnosis of  hypertension 01/08/2021   Dichorionic diamniotic twin pregnancy in third trimester 08/14/2020   Preeclampsia 08/14/2020   Twin pregnancy delivered vaginally 08/14/2020   Preterm labor 06/17/2020   Right trigeminal neuralgia 11/08/2019   Endometriosis 03/27/2019   PCOS (polycystic ovarian syndrome) 03/27/2019   Moderate persistent asthma 03/07/2019   Post-nasal drainage 03/07/2019   Irritable bowel syndrome with diarrhea 03/07/2019   Gastroesophageal reflux disease without esophagitis 03/07/2019   Class 2 obesity due to excess calories without serious comorbidity with body mass index (BMI) of 38.0 to 38.9 in adult 09/02/2018   Bipolar 2 disorder (HCC) 08/12/2018   History of low birth weight 03/11/2018   Vulvar atrophy 03/11/2018   History of migraine headaches 05/01/2015   Anxiety and depression 05/01/2015   [employer] Authorization   Choose one: Neuro Rehabilitative  Standardized Assessment or Functional Outcome Tool: See Pain Assessment and Other DPS: 45.1, DFS: 38.5  Score or Percent Disability: Moderate Disability  Body Parts Treated (Select each separately):  Other Inner Ear . Overall deficits/functional limitations for body part selected: moderate  Tempie Donning, PT, DPT 12/10/2021, 8:23 AM  [facility] [address] [place], [place], [address] Phone: [phone]   Fax:  [phone]  Name: Loveta Dellis MRN: [id number] Date of Birth: 02-Jun-1996

## 2021-12-11 DIAGNOSIS — G8929 Other chronic pain: Secondary | ICD-10-CM | POA: Diagnosis not present

## 2021-12-11 DIAGNOSIS — M791 Myalgia, unspecified site: Secondary | ICD-10-CM | POA: Diagnosis not present

## 2021-12-11 DIAGNOSIS — M545 Low back pain, unspecified: Secondary | ICD-10-CM | POA: Diagnosis not present

## 2021-12-17 ENCOUNTER — Other Ambulatory Visit: Payer: Self-pay

## 2021-12-17 ENCOUNTER — Encounter: Payer: Self-pay | Admitting: Nurse Practitioner

## 2021-12-17 ENCOUNTER — Ambulatory Visit: Payer: Medicaid Other | Admitting: Nurse Practitioner

## 2021-12-17 VITALS — BP 124/72 | HR 70 | Temp 98.4°F | Resp 18 | Ht 61.0 in | Wt 199.9 lb

## 2021-12-17 DIAGNOSIS — G8929 Other chronic pain: Secondary | ICD-10-CM

## 2021-12-17 DIAGNOSIS — G43909 Migraine, unspecified, not intractable, without status migrainosus: Secondary | ICD-10-CM

## 2021-12-17 DIAGNOSIS — F3181 Bipolar II disorder: Secondary | ICD-10-CM | POA: Diagnosis not present

## 2021-12-17 DIAGNOSIS — M797 Fibromyalgia: Secondary | ICD-10-CM | POA: Diagnosis not present

## 2021-12-17 DIAGNOSIS — F411 Generalized anxiety disorder: Secondary | ICD-10-CM | POA: Diagnosis not present

## 2021-12-17 DIAGNOSIS — M545 Low back pain, unspecified: Secondary | ICD-10-CM

## 2021-12-17 DIAGNOSIS — Z419 Encounter for procedure for purposes other than remedying health state, unspecified: Secondary | ICD-10-CM | POA: Diagnosis not present

## 2021-12-17 DIAGNOSIS — R42 Dizziness and giddiness: Secondary | ICD-10-CM | POA: Diagnosis not present

## 2021-12-17 DIAGNOSIS — R5383 Other fatigue: Secondary | ICD-10-CM

## 2021-12-17 DIAGNOSIS — F900 Attention-deficit hyperactivity disorder, predominantly inattentive type: Secondary | ICD-10-CM | POA: Diagnosis not present

## 2021-12-17 NOTE — Progress Notes (Signed)
? ?BP 124/72   Pulse 70   Temp 98.4 ?F (36.9 ?C) (Oral)   Resp 18   Ht 5\' 1"  (1.549 m)   Wt 199 lb 14.4 oz (90.7 kg)   LMP 04/08/2021   SpO2 97%   BMI 37.77 kg/m?   ? ?Subjective:  ? ? [name], female    DOB: 06/03/1996, 26 y.o.   MRN: [id number] ? ?HPI: ?Janet Rich is a 26 y.o. female ? ?Chief Complaint  ?Patient presents with  ? Joint Pain  ? Referral  ?  Rheumatology  ? ?Fibromyalgia/joint pain/fatigue: She is currently seeing rheumatology, Dr. 34. She says he is retiring and she is looking for another rheumatologist.  Discussed that it may be difficulty to get into another rheumatology office, that she may want to consider the pain clinic and she declined that.   She says she is scheduled with a new provider at the same clinic but would like someone in [place] if possible. She is currently taking gabapentin 400 mg nightly that helps only a little bit.  She says she is planning on applying for disability due to her chronic pain and fatigue.  ? ?Back pain: She says that when she saw her rheumatologist on 12/11/21, he referred her to a physiatrist for her chronic midline low back pain without sciatica. She does currently get treatment at a chiropractor office. She says they recently did xrays and her scoliosis is bad.  ? ?Migraines/Vertigo: She says she is still struggling with vertigo. She is still taking the meclizine. She saw neurology on 12/05/2021.  They ordered a MRI which she is getting done on Sunday.  She was also referred to neurology physical therapy.  ? ?Bipolar: She says that her stress level has definitely increased due to all her medical issues.  She is currently being treated by psychiatry at [facility].  They are adjusting her medications.  She is currently on wellbutrin, xanax and latuda. She denies any suicidal thoughts. She says another concern she has is that she will run out of Medicaid in eight months and then how will she afford all her medical  care.  ?Depression screen [facility] 2/9 12/17/2021 11/13/2021 11/10/2021 10/09/2021 04/15/2021  ?Decreased Interest 2 0 1 2 2   ?Down, Depressed, Hopeless 2 0 1 2 1   ?PHQ - 2 Score 4 0 2 4 3   ?Altered sleeping 2 0 0 0 0  ?Tired, decreased energy 2 0 0 3 1  ?Change in appetite 0 0 0 1 0  ?Feeling bad or failure about yourself  1 0 0 3 0  ?Trouble concentrating 3 0 0 1 0  ?Moving slowly or fidgety/restless 0 0 0 0 0  ?Suicidal thoughts 0 0 0 0 0  ?PHQ-9 Score 12 0 2 12 4   ?Difficult doing work/chores Not difficult at all Not difficult at all Not difficult at all Somewhat difficult Not difficult at all  ?Some recent data might be hidden  ?   ? ?Relevant past medical, surgical, family and social history reviewed and updated as indicated. Interim medical history since our last visit reviewed. ?Allergies and medications reviewed and updated. ? ?Review of Systems ? ?Constitutional: Negative for fever or weight change.  ?Respiratory: Negative for cough and shortness of breath.   ?Cardiovascular: Negative for chest pain or palpitations.  ?Gastrointestinal: Negative for abdominal pain, no bowel changes.  ?Musculoskeletal: Negative for gait problem or joint swelling. Positive for back pain and right ankle pain ?Skin: Negative for  rash.  ?Neurological: Positive for dizziness or headache.  ?No other specific complaints in a complete review of systems (except as listed in HPI above).  ? ?   ?Objective:  ?  ?BP 124/72   Pulse 70   Temp 98.4 ?F (36.9 ?C) (Oral)   Resp 18   Ht 5\' 1"  (1.549 m)   Wt 199 lb 14.4 oz (90.7 kg)   LMP 04/08/2021   SpO2 97%   BMI 37.77 kg/m?   ?Wt Readings from Last 3 Encounters:  ?12/17/21 199 lb 14.4 oz (90.7 kg)  ?12/05/21 198 lb (89.8 kg)  ?11/28/21 197 lb 12.8 oz (89.7 kg)  ?  ?Physical Exam ? ?Constitutional: Patient appears well-developed and well-nourished. Obese  No distress.  ?HEENT: head atraumatic, normocephalic, pupils equal and reactive to light,neck supple ?Cardiovascular: Normal rate, regular  rhythm and normal heart sounds.  No murmur heard. No BLE edema. ?Pulmonary/Chest: Effort normal and breath sounds normal. No respiratory distress. ?Abdominal: Soft.  There is no tenderness. ?Psychiatric: Patient has a normal mood and affect. behavior is normal. Judgment and thought content normal.  ? ?Results for orders placed or performed in visit on 11/13/21  ?CBC with Differential/Platelet  ?Result Value Ref Range  ? WBC 9.6 3.8 - 10.8 Thousand/uL  ? RBC 4.76 3.80 - 5.10 Million/uL  ? Hemoglobin 13.2 11.7 - 15.5 g/dL  ? HCT 40.1 35.0 - 45.0 %  ? MCV 84.2 80.0 - 100.0 fL  ? MCH 27.7 27.0 - 33.0 pg  ? MCHC 32.9 32.0 - 36.0 g/dL  ? RDW 13.8 11.0 - 15.0 %  ? Platelets 375 140 - 400 Thousand/uL  ? MPV 10.0 7.5 - 12.5 fL  ? Neutro Abs 5,866 1,500 - 7,800 cells/uL  ? Lymphs Abs 3,082 850 - 3,900 cells/uL  ? Absolute Monocytes 499 200 - 950 cells/uL  ? Eosinophils Absolute 134 15 - 500 cells/uL  ? Basophils Absolute 19 0 - 200 cells/uL  ? Neutrophils Relative % 61.1 %  ? Total Lymphocyte 32.1 %  ? Monocytes Relative 5.2 %  ? Eosinophils Relative 1.4 %  ? Basophils Relative 0.2 %  ?Sedimentation rate  ?Result Value Ref Range  ? Sed Rate 22 (H) 0 - 20 mm/h  ?C-reactive protein  ?Result Value Ref Range  ? CRP 9.2 (H) <8.0 mg/L  ? ?   ?Assessment & Plan:  ? ?1. Fibromyalgia ?-continue current treatment ?- Ambulatory referral to Rheumatology ? ?2. Fatigue, unspecified type ?-continue current treatment ?- Ambulatory referral to Rheumatology ? ?3. Chronic midline low back pain without sciatica ?-continue current treatment plan ? ?4. Migraine without status migrainosus, not intractable, unspecified migraine type ?-continue current treatment plan ? ?5. Vertigo ?-continue current treatment plan ? ? ?6. Bipolar 2 disorder (HCC) ?-continue current treatment plan ? ?Follow up plan: ?Return if symptoms worsen or fail to improve. ? ? ? ? ? ?

## 2021-12-21 ENCOUNTER — Ambulatory Visit
Admission: RE | Admit: 2021-12-21 | Discharge: 2021-12-21 | Disposition: A | Discharge status: Home / Self Care | Payer: Medicaid Other | Source: Ambulatory Visit | Attending: Neurology | Admitting: Neurology

## 2021-12-21 ENCOUNTER — Other Ambulatory Visit: Payer: Self-pay

## 2021-12-21 DIAGNOSIS — R42 Dizziness and giddiness: Secondary | ICD-10-CM | POA: Diagnosis not present

## 2021-12-21 DIAGNOSIS — R51 Headache with orthostatic component, not elsewhere classified: Secondary | ICD-10-CM

## 2021-12-21 DIAGNOSIS — H539 Unspecified visual disturbance: Secondary | ICD-10-CM

## 2021-12-21 DIAGNOSIS — R519 Headache, unspecified: Secondary | ICD-10-CM

## 2021-12-21 MED ORDER — GADOBENATE DIMEGLUMINE 529 MG/ML IV SOLN
20.0000 mL | Freq: Once | INTRAVENOUS | Status: AC | PRN
  Administered 2021-12-21: 20 mL via INTRAVENOUS

## 2021-12-22 ENCOUNTER — Ambulatory Visit: Payer: Medicaid Other | Admitting: Licensed Clinical Social Worker

## 2021-12-22 DIAGNOSIS — M5489 Other dorsalgia: Secondary | ICD-10-CM | POA: Diagnosis not present

## 2021-12-22 DIAGNOSIS — M542 Cervicalgia: Secondary | ICD-10-CM | POA: Diagnosis not present

## 2021-12-22 DIAGNOSIS — M5430 Sciatica, unspecified side: Secondary | ICD-10-CM | POA: Diagnosis not present

## 2021-12-22 NOTE — Progress Notes (Unsigned)
Counselor/Therapist Progress Note ? ?[name], MRN: [id number],   ? ?Date: 12/22/2021 ? ?Time Spent: ***  ? ?Treatment Type: Psychotherapy ? ?Reported Symptoms: {CHL AMB Reported Symptoms:[id number]} ? ?Mental Status Exam: ? ?Appearance:   {PSY:22683}     ?Behavior:  {PSY:21022743}  ?Motor:  {PSY:22302}  ?Speech/Language:   {PSY:22685}  ?Affect:  {PSY:22687}  ?Mood:  {PSY:31886}  ?Thought process:  {PSY:31888}  ?Thought content:    {PSY:8320191531}  ?Sensory/Perceptual disturbances:    {PSY:214 191 3148}  ?Orientation:  {PSY:30297}  ?Attention:  {PSY:22877}  ?Concentration:  {PSY:8067485131}  ?Memory:  {PSY:905-659-5392}  ?Fund of knowledge:   {PSY:8067485131}  ?Insight:    {PSY:8067485131}  ?Judgment:   {PSY:8067485131}  ?Impulse Control:  {PSY:8067485131}  ? ?Risk Assessment: ?Danger to Self:  No ?Self-injurious Behavior: No ?Danger to Others: No ?Duty to Warn:no ?Physical Aggression / Violence:No  ?Access to Firearms a concern: No  ?Gang Involvement:No  ? ?Subjective: Patient was receptive to feedback and intervention from LCSW and actively and effectively participated throughout the session. Patient is likely to benefit from future treatment because  remains motivated to decrease  And   and reports benefit of regular sessions in addressing these symptoms.   ? ?Interventions: Cognitive Behavioral Therapy ? ?Diagnosis: ?  ICD-10-CM   ?1. Mild bipolar II disorder, most recent episode major depressive (HCC)  F31.81   ?  ?2. Generalized anxiety disorder  F41.1   ?  ? ? ?Plan: Patient's goal is: Help with anxiety and working through things. She reports benefit from having someone to listen to her. ?  ?Treatment Target: Increase realistic balanced thinking ?Explore patient?s thoughts, beliefs, automatic thoughts, assumptions ?Identify unhelpful thinking patterns ?Process distress and allow for emotional release ?Questioning and challenging thoughts ?Cognitive reappraisal  ? Treatment Target: Increase coping  skills ?Mindfulness practices ?Acceptance practices  ?Intentional / Deep breathing ?STOP technique ?Grounding exercises ?Treatment Target: Reducing vulnerability to ?emotional mind? ?Values clarification   ?Self-care - nutrition, sleep, exercise ?Increase living within values  ?Teach distress tolerance techniques - ?what helps me?  ?  ?Future Appointments  ?Date Time Provider Department Center  ?12/22/2021  2:00 PM Kathreen Cosier, LCSW [facility] None  ?12/26/2021  4:30 PM [facility] None  ?12/31/2021 12:30 PM Tempie Donning, PT [facility]  ?01/07/2022 12:30 PM Tempie Donning, PT [facility]  ?01/14/2022 12:30 PM Tempie Donning, PT [facility]  ?01/21/2022 12:30 PM Tempie Donning, PT [facility]  ?01/28/2022 12:30 PM Tempie Donning, PT [facility]  ?02/04/2022 12:30 PM Tempie Donning, PT [facility]  ? ? ?Kathreen Cosier, LCSW ? ?

## 2021-12-24 DIAGNOSIS — D485 Neoplasm of uncertain behavior of skin: Secondary | ICD-10-CM | POA: Diagnosis not present

## 2021-12-24 DIAGNOSIS — R42 Dizziness and giddiness: Secondary | ICD-10-CM | POA: Diagnosis not present

## 2021-12-26 ENCOUNTER — Other Ambulatory Visit: Payer: Self-pay

## 2021-12-26 ENCOUNTER — Ambulatory Visit: Payer: Medicaid Other

## 2021-12-26 DIAGNOSIS — G4733 Obstructive sleep apnea (adult) (pediatric): Secondary | ICD-10-CM

## 2021-12-26 DIAGNOSIS — R0683 Snoring: Secondary | ICD-10-CM

## 2021-12-31 ENCOUNTER — Other Ambulatory Visit: Payer: Self-pay

## 2021-12-31 ENCOUNTER — Ambulatory Visit: Payer: Medicaid Other | Attending: Neurology

## 2021-12-31 DIAGNOSIS — R42 Dizziness and giddiness: Secondary | ICD-10-CM | POA: Diagnosis not present

## 2021-12-31 DIAGNOSIS — R2681 Unsteadiness on feet: Secondary | ICD-10-CM | POA: Diagnosis not present

## 2021-12-31 NOTE — Therapy (Signed)
?OUTPATIENT PHYSICAL THERAPY TREATMENT NOTE ? ? ?Patient Name: Janet Rich ?MRN: [id number] ?DOB:04-28-1996, 26 y.o., female ?Today's Date: 12/31/2021 ? ?PCP: Berniece Salines, FNP ?REFERRING PROVIDER: Anson Fret, MD ? ? PT End of Session - 12/31/21 1218   ? ? Visit Number 2   ? Number of Visits 7   ? Date for PT Re-Evaluation --   at 7th visit  ? Authorization Type [employer] [facility] (VL 27)   ? Authorization Time Period Approved 3 PT visits 12/31/21 - 01/21/22   ? Authorization - Visit Number 1   ? Authorization - Number of Visits 3   ? PT Start Time 1222   ? PT Stop Time 1305   ? PT Time Calculation (min) 43 min   ? Activity Tolerance Patient tolerated treatment well   ? Behavior During Therapy [facility] for tasks assessed/performed   ? ?  ?  ? ?  ? ? ?Past Medical History:  ?Diagnosis Date  ? Anemia   ? with periods  ? Ankle fracture   ? right  ? Anxiety   ? Asthma   ? Back pain   ? Bipolar 1 disorder (HCC)   ? Depression   ? Bipolar   ? Endometriosis   ? GERD (gastroesophageal reflux disease)   ? Headache   ? MIGRAINES  ? IBS (irritable bowel syndrome)   ? Ovarian cyst   ? PCOS (polycystic ovarian syndrome)   ? Pneumonia   ? age 12  ? PONV (postoperative nausea and vomiting)   ? nausea after wisdom teeth extraction  ? Scoliosis   ? Vaginal Pap smear, abnormal   ? bx ok  ? ?Past Surgical History:  ?Procedure Laterality Date  ? FRACTURE SURGERY    ? ankle surgery with impant   ? HYSTEROSCOPY WITH D & C N/A 09/22/2019  ? Procedure: DILATATION AND CURETTAGE /HYSTEROSCOPY;  Surgeon: Ward, Elenora Fender, MD;  Location: [facility];  Service: Gynecology;  Laterality: N/A;  ? LYSIS OF ADHESION N/A 05/08/2021  ? Procedure: LYSIS OF ADHESION, EXCISION OF ENDOMETRIOSIS;  Surgeon: Christeen Douglas, MD;  Location: [facility];  Service: Gynecology;  Laterality: N/A;  ? ROBOTIC ASSISTED DIAGNOSTIC LAPAROSCOPY N/A 02/18/2017  ? Procedure: ROBOTIC ASSISTED DIAGNOSTIC LAPAROSCOPY,EXCISION AND ABLATION OF ENDOMETRIOSIS,LYSIS OF ADHESIONS;  Surgeon:  Olivia Mackie, MD;  Location: [facility];  Service: Gynecology;  Laterality: N/A;  ? ROBOTIC ASSISTED LAPAROSCOPIC HYSTERECTOMY AND SALPINGECTOMY Bilateral 05/08/2021  ? Procedure: XI ROBOTIC ASSISTED TOTAL LAPAROSCOPIC HYSTERECTOMY AND SALPINGECTOMY;  Surgeon: Christeen Douglas, MD;  Location: [facility];  Service: Gynecology;  Laterality: Bilateral;  ? VAGINAL DELIVERY N/A 08/14/2020  ? Procedure: VAGINAL DELIVERY;  Surgeon: Schermerhorn, Ihor Austin, MD;  Location: [facility];  Service: Obstetrics;  Laterality: N/A;  ? WISDOM TOOTH EXTRACTION  2013  ? ?Patient Active Problem List  ? Diagnosis Date Noted  ? Excessive daytime sleepiness 11/28/2021  ? Endometriosis determined by laparoscopy 05/08/2021  ? Myalgia 03/11/2021  ? Elevated BP without diagnosis of hypertension 01/08/2021  ? Dichorionic diamniotic twin pregnancy in third trimester 08/14/2020  ? Preeclampsia 08/14/2020  ? Twin pregnancy delivered vaginally 08/14/2020  ? Preterm labor 06/17/2020  ? Right trigeminal neuralgia 11/08/2019  ? Endometriosis 03/27/2019  ? PCOS (polycystic ovarian syndrome) 03/27/2019  ? Moderate persistent asthma 03/07/2019  ? Post-nasal drainage 03/07/2019  ? Irritable bowel syndrome with diarrhea 03/07/2019  ? Gastroesophageal reflux disease without esophagitis 03/07/2019  ? Class 2 obesity due to excess calories without serious comorbidity with body mass index (  BMI) of 38.0 to 38.9 in adult 09/02/2018  ? Bipolar 2 disorder (HCC) 08/12/2018  ? History of low birth weight 03/11/2018  ? Vulvar atrophy 03/11/2018  ? History of migraine headaches 05/01/2015  ? Anxiety and depression 05/01/2015  ? ? ?REFERRING DIAG: R42 (ICD-10-CM) - Vertigo ? ?THERAPY DIAG:  ?Dizziness and giddiness ? ?Unsteadiness on feet ? ?PERTINENT HISTORY: Anemia, R Ankle Fx, Anxiety, Asthma, Bipolar, GERD, IBS, Scoliosis, Back Pain, Trigeminal Neuralgia, Depression  ? ?PRECAUTIONS: None ? ?SUBJECTIVE: Received MRI results, was normal. Went to ENT, reports that  Dix-Hallpike was negative. Plans to have VNG done. Patient reports has been doing good, reports the dizziness has been better. Patient does experience some increase dizziness with exerting her self.  ? ?PAIN:  ?Are you having pain? Yes: NPRS scale: 2/10 ?Pain location: Anterior (between the eyes) ?Pain description: Headache ? ? ?TODAY'S TREATMENT:  ?Neuro re-ed: sensory organization test performed with following results: ?Conditions: ?1: all above age related norms ?2: all above age related norms ?3: 1st below age related, 2nd and 3rd above age related norm (mild dizziness)  ?4: all above age related norms ?5: 1st and 2nd above age related norms; 3rd  ?6: all above age related norms ?Composite score: 76% (WNL) ?Sensory Analysis ?Som: WNL ?Vis: WNL ?Vest: WNL ?Pref: WNL ?Strategy analysis: ankle dominant       ?COG alignment: posterior COG alignment      ? ?Gaze Stabilization:  ?VOR x 1 Horizontal: standing bil stance, x 30 seconds, x 45 seconds. Mild - Mod Dizziness. Reports increased symptoms with head turn to the R (gaze to the L)  ?VOR x 1 Vertical: standing bil stance, x 30 seconds, x 45 seconds. Mod Dizziness. Feels as begin to make HA worse, rated at 4/10 after VOR.  ? ?Provided HEP:  ?Gaze Stabilization: Standing Feet Apart ? ? ? ?Feet shoulder width apart, keeping eyes on target on wall 3-4 feet away, tilt head down 15-30? and move head side to side for 45 seconds. Repeat while moving head up and down for 45 seconds. ?Do 2-3 sessions per day. ? ? ?PATIENT EDUCATION: ?Education details: SOT Results; Initial HEP ?Person educated: Patient ?Education method: Explanation ?Education comprehension: verbalized understanding ? ? ?HOME EXERCISE PROGRAM: ?VOR x 1 (Standing x 45 Seconds)  ? ? PT Short Term Goals - 12/10/21 0818   ? ?  ? PT SHORT TERM GOAL #1  ? Title Pt will be independent with initial vestibular/balance HEP (ALL STGs Due: at 4th Visit)   ? Baseline no HEP Established   ? Time 3   ? Period --   visits   ? Status New   ?  ? PT SHORT TERM GOAL #2  ? Title SOT TBA and LTG to be set as applicable   ? Baseline TBA; WNL on SOT  ? Time 3   ? Period --   visits  ? Status Achieved  ? ?  ?  ? ?  ? ? ? PT Long Term Goals - 12/10/21 0818   ? ?  ? PT LONG TERM GOAL #1  ? Title Pt will be independent with final and progressive vestibular/balance HEP (ALL LTGs Due at 7th Visit)   ? Baseline No HEP established   ? Time 6   ? Period --   visits  ? Status New   ?  ? PT LONG TERM GOAL #2  ? Title LTG to be set for SOT   ? Baseline TBA; WNL  on SOT  ? Time 6   ? Period --   visits  ? Status Deferred  ?  ? PT LONG TERM GOAL #3  ? Title Pt will improve al components of MSQ to </= 2/5 for improvements in dizziness and activity tolerance   ? Baseline 3-4/5   ? Time 6   ? Period --   visits  ? Status New   ?  ? PT LONG TERM GOAL #4  ? Title Pt will report being able to bend over and pick up children with no dizziness to demo improved tolerance for activity   ? Baseline mod dizziness with bending   ? Time 6   ? Period --   visits  ? Status New   ?  ? PT LONG TERM GOAL #5  ? Title Pt will improve DFS to >/= 45   ? Baseline 38.1   ? Time 6   ? Period --   visits  ? Status New   ? ?  ?  ? ?  ? ? ? Plan - 12/31/21 1309   ? ? Clinical Impression Statement Completed assesment of patients balance with SOT, all sensory systems WNL and compiste score of 76% (within normal age limits). Mild dizziness noted with completion of SOT but quick resolution noted. Rest of session spent establishing initial HEP focused on VOR x 1. Pt tolerating well with mild-mod dizziness requiring intermittent rest breaks. Will continue per POC.   ? Personal Factors and Comorbidities Comorbidity 3+;Time since onset of injury/illness/exacerbation   ? Comorbidities Anemia, R Ankle Fx, Anxiety, Asthma, Bipolar, GERD, IBS, Scoliosis, Back Pain, Trigeminal Neuralgia, Depression   ? Stability/Clinical Decision Making Stable/Uncomplicated   ? Rehab Potential Good   ? PT Frequency  1x / week   ? PT Duration 6 weeks   ? PT Treatment/Interventions ADLs/Self Care Home Management;Neuromuscular re-education;Balance training;Therapeutic exercise;Therapeutic activities;Functional mobility training;St

## 2021-12-31 NOTE — Patient Instructions (Signed)
Gaze Stabilization: Standing Feet Apart ? ? ? ?Feet shoulder width apart, keeping eyes on target on wall 3-4 feet away, tilt head down 15-30? and move head side to side for 45 seconds. Repeat while moving head up and down for 45 seconds. ?Do 2-3 sessions per day. ? ? ?Gaze Stabilization: Tip Card ? ?1.Target must remain in focus, not blurry, and appear stationary while head is in motion. ?2.Perform exercises with small head movements (45? to either side of midline). ?3.Increase speed of head motion so long as target is in focus. ?4.If you wear eyeglasses, be sure you can see target through lens (therapist will give specific instructions for bifocal / progressive lenses). ?5.These exercises may provoke dizziness or nausea. Work through these symptoms. If too dizzy, slow head movement slightly. Rest between each exercise. ?6.Exercises demand concentration; avoid distractions. ?7.For safety, perform standing exercises close to a counter, wall, corner, or next to someone. ? ?Copyright ? [employer]. All rights reserved.  ? ? ?

## 2022-01-01 DIAGNOSIS — G4733 Obstructive sleep apnea (adult) (pediatric): Secondary | ICD-10-CM | POA: Diagnosis not present

## 2022-01-02 ENCOUNTER — Telehealth: Payer: Self-pay | Admitting: Adult Health

## 2022-01-02 NOTE — Telephone Encounter (Signed)
ATC x1, no answer, LVM to return call regarding HST and to schedule OV. ?

## 2022-01-02 NOTE — Telephone Encounter (Signed)
Home sleep study on December 27, 2021 showed no significant sleep apnea with AHI at 2.7/hour and SPO2 low at 88%.  Please set up office visit to review results. ?

## 2022-01-05 ENCOUNTER — Ambulatory Visit: Payer: Medicaid Other | Admitting: Licensed Clinical Social Worker

## 2022-01-05 DIAGNOSIS — F3181 Bipolar II disorder: Secondary | ICD-10-CM

## 2022-01-05 DIAGNOSIS — F411 Generalized anxiety disorder: Secondary | ICD-10-CM

## 2022-01-05 NOTE — Progress Notes (Signed)
Counselor/Therapist Progress Note ? ?[name], MRN: [id number],   ? ?Date: 01/05/2022 ? ?Time Spent: 45 minutes  ? ?Treatment Type: Psychotherapy ? ?Reported Symptoms: Patient describes intermittent symptoms of depressed mood. Anhedonia, Sleep disturbance, Fatigue, Physical aches and pain, and depressed mood; anxiety, anxious thoughts ? ?Mental Status Exam: ? ?Appearance:   Casual     ?Behavior:  Appropriate and Sharing  ?Motor:  Normal  ?Speech/Language:   Normal Rate  ?Affect:  Appropriate, Congruent, and Full Range  ?Mood:  normal  ?Thought process:  normal  ?Thought content:    WNL  ?Sensory/Perceptual disturbances:    WNL  ?Orientation:  oriented to person, place, time/date, and situation  ?Attention:  Good  ?Concentration:  Fair  ?Memory:  WNL  ?Fund of knowledge:   Good  ?Insight:    Good  ?Judgment:   Good  ?Impulse Control:  Good  ? ?Risk Assessment: ?Danger to Self:  No ?Self-injurious Behavior: No ?Danger to Others: No ?Duty to Warn:no ?Physical Aggression / Violence:No  ?Access to Firearms a concern: No  ?Gang Involvement:No  ? ?Subjective: Patient was receptive to feedback and intervention from LCSW and actively and effectively participated throughout the session. Patient is likely to benefit from future treatment because she remains motivated to decrease  Symptoms and reports benefit of regular sessions in addressing these symptoms.   ? ?Interventions: Cognitive Behavioral Therapy and Client Centered Therapy ?Established psychological safety. Checked in with patient regarding their week. LCSW assisted patient in processing their their thoughts and emotions about what they are experiencing with chronic health issues and life style changes. Patient reports benefit from a good support system, dietary changes and learning new ways of eating, positive self-talk, her children, and engaging in interactive game. Provided support through active listening, validation of feelings, and highlighted  patient's strengths.  ? ?Diagnosis: ?  ICD-10-CM   ?1. Generalized anxiety disorder  F41.1   ?  ?2. Mild bipolar II disorder, most recent episode major depressive (HCC)  F31.81   ?  ? ?Plan: Patient's goal is: Help with anxiety and working through things. She reports benefit from having someone to listen to her. ?  ?Treatment Target: Increase realistic balanced thinking ?Explore patient?s thoughts, beliefs, automatic thoughts, assumptions ?Identify unhelpful thinking patterns ?Process distress and allow for emotional release ?Questioning and challenging thoughts ?Cognitive reappraisal  ? Treatment Target: Increase coping skills ?Mindfulness practices ?Acceptance practices  ?Intentional / Deep breathing ?STOP technique ?Grounding exercises ?Treatment Target: Reducing vulnerability to ?emotional mind? ?Values clarification   ?Self-care - nutrition, sleep, exercise ?Increase living within values  ?Teach distress tolerance techniques - ?what helps me?  ? ?Future Appointments  ?Date Time Provider Department Center  ?01/07/2022 12:30 PM Tempie Donning, PT [facility]  ?01/12/2022  2:00 PM Kathreen Cosier, LCSW [facility] None  ?01/14/2022 12:30 PM Tempie Donning, PT [facility]  ?01/21/2022 12:30 PM Tempie Donning, PT [facility]  ?01/28/2022 12:30 PM Tempie Donning, PT [facility]  ?02/04/2022 12:30 PM Tempie Donning, PT [facility]  ? ? ? ? ?Kathreen Cosier, LCSW ? ?

## 2022-01-06 DIAGNOSIS — F900 Attention-deficit hyperactivity disorder, predominantly inattentive type: Secondary | ICD-10-CM | POA: Diagnosis not present

## 2022-01-07 ENCOUNTER — Ambulatory Visit: Payer: Medicaid Other

## 2022-01-07 ENCOUNTER — Other Ambulatory Visit: Payer: Self-pay

## 2022-01-07 DIAGNOSIS — R2681 Unsteadiness on feet: Secondary | ICD-10-CM

## 2022-01-07 DIAGNOSIS — R42 Dizziness and giddiness: Secondary | ICD-10-CM | POA: Diagnosis not present

## 2022-01-07 NOTE — Patient Instructions (Signed)
Gaze Stabilization: Sitting ? ? ? ?Keeping eyes on target on wall 3-4 feet away, tilt head down 15-30? and move head side to side for 60 seconds. Repeat while moving head up and down for 45 seconds. ?Do 2-3 sessions per day. ?Copyright ? [employer]. All rights reserved.  ? ?

## 2022-01-07 NOTE — Therapy (Signed)
?OUTPATIENT PHYSICAL THERAPY TREATMENT NOTE ? ? ?Patient Name: Janet Rich ?MRN: [id number] ?DOB:05/30/1996, 26 y.o., female ?Today's Date: 01/07/2022 ? ?PCP: Berniece Salines, FNP ?REFERRING PROVIDER: Anson Fret, MD ? ? PT End of Session - 01/07/22 1231   ? ? Visit Number 3   ? Number of Visits 7   ? Date for PT Re-Evaluation --   at 7th visit  ? Authorization Type [employer] [facility] (VL 27)   ? Authorization Time Period Approved 3 PT visits 12/31/21 - 01/21/22   ? Authorization - Visit Number 1   ? Authorization - Number of Visits 3   ? PT Start Time 1231   ? PT Stop Time 1313   ? PT Time Calculation (min) 42 min   ? Activity Tolerance Patient tolerated treatment well   ? Behavior During Therapy [facility] for tasks assessed/performed   ? ?  ?  ? ?  ? ? ?Past Medical History:  ?Diagnosis Date  ? Anemia   ? with periods  ? Ankle fracture   ? right  ? Anxiety   ? Asthma   ? Back pain   ? Bipolar 1 disorder (HCC)   ? Depression   ? Bipolar   ? Endometriosis   ? GERD (gastroesophageal reflux disease)   ? Headache   ? MIGRAINES  ? IBS (irritable bowel syndrome)   ? Ovarian cyst   ? PCOS (polycystic ovarian syndrome)   ? Pneumonia   ? age 34  ? PONV (postoperative nausea and vomiting)   ? nausea after wisdom teeth extraction  ? Scoliosis   ? Vaginal Pap smear, abnormal   ? bx ok  ? ?Past Surgical History:  ?Procedure Laterality Date  ? FRACTURE SURGERY    ? ankle surgery with impant   ? HYSTEROSCOPY WITH D & C N/A 09/22/2019  ? Procedure: DILATATION AND CURETTAGE /HYSTEROSCOPY;  Surgeon: Ward, Elenora Fender, MD;  Location: [facility];  Service: Gynecology;  Laterality: N/A;  ? LYSIS OF ADHESION N/A 05/08/2021  ? Procedure: LYSIS OF ADHESION, EXCISION OF ENDOMETRIOSIS;  Surgeon: Christeen Douglas, MD;  Location: [facility];  Service: Gynecology;  Laterality: N/A;  ? ROBOTIC ASSISTED DIAGNOSTIC LAPAROSCOPY N/A 02/18/2017  ? Procedure: ROBOTIC ASSISTED DIAGNOSTIC LAPAROSCOPY,EXCISION AND ABLATION OF ENDOMETRIOSIS,LYSIS OF ADHESIONS;  Surgeon:  Olivia Mackie, MD;  Location: [facility];  Service: Gynecology;  Laterality: N/A;  ? ROBOTIC ASSISTED LAPAROSCOPIC HYSTERECTOMY AND SALPINGECTOMY Bilateral 05/08/2021  ? Procedure: XI ROBOTIC ASSISTED TOTAL LAPAROSCOPIC HYSTERECTOMY AND SALPINGECTOMY;  Surgeon: Christeen Douglas, MD;  Location: [facility];  Service: Gynecology;  Laterality: Bilateral;  ? VAGINAL DELIVERY N/A 08/14/2020  ? Procedure: VAGINAL DELIVERY;  Surgeon: Schermerhorn, Ihor Austin, MD;  Location: [facility];  Service: Obstetrics;  Laterality: N/A;  ? WISDOM TOOTH EXTRACTION  2013  ? ?Patient Active Problem List  ? Diagnosis Date Noted  ? Excessive daytime sleepiness 11/28/2021  ? Endometriosis determined by laparoscopy 05/08/2021  ? Myalgia 03/11/2021  ? Elevated BP without diagnosis of hypertension 01/08/2021  ? Dichorionic diamniotic twin pregnancy in third trimester 08/14/2020  ? Preeclampsia 08/14/2020  ? Twin pregnancy delivered vaginally 08/14/2020  ? Preterm labor 06/17/2020  ? Right trigeminal neuralgia 11/08/2019  ? Endometriosis 03/27/2019  ? PCOS (polycystic ovarian syndrome) 03/27/2019  ? Moderate persistent asthma 03/07/2019  ? Post-nasal drainage 03/07/2019  ? Irritable bowel syndrome with diarrhea 03/07/2019  ? Gastroesophageal reflux disease without esophagitis 03/07/2019  ? Class 2 obesity due to excess calories without serious comorbidity with body mass index (  BMI) of 38.0 to 38.9 in adult 09/02/2018  ? Bipolar 2 disorder (HCC) 08/12/2018  ? History of low birth weight 03/11/2018  ? Vulvar atrophy 03/11/2018  ? History of migraine headaches 05/01/2015  ? Anxiety and depression 05/01/2015  ? ? ?REFERRING DIAG: R42 (ICD-10-CM) - Vertigo ? ?THERAPY DIAG:  ?Dizziness and giddiness ? ?Unsteadiness on feet ? ?PERTINENT HISTORY: Anemia, R Ankle Fx, Anxiety, Asthma, Bipolar, GERD, IBS, Scoliosis, Back Pain, Trigeminal Neuralgia, Depression  ? ?PRECAUTIONS: None ? ?SUBJECTIVE: Has not been able to complete the exercise. Reports dizziness has been  decent, but nothing terrible. No other new changes/complaints.  ? ?PAIN:  ?Are you having pain? Yes: NPRS scale: 2/10 ?Pain location: Anterior (between the eyes) ?Pain description: Starting of a Headache ? ? ?TODAY'S TREATMENT:  ? ?Gaze Stabilization:  ?VOR x 1 Horizontal: Seated x 45 seconds, x 60 seconds. Mild Dizziness. ?VOR x 1 Vertical: Seated, x 45 seconds x 2 reps.  With downward head movement and upward gaze reports increased HA, modified head nods.  ? ?Completed the following balance activities and added to HEP:  ? ?Access Code: [email or web address] ?URL: [email or web address] ?Date: 01/07/2022 ?Prepared by: Jethro Bastos ? ?Program Notes ?Walking with Eyes Closed  ?Close your eyes. Begin walking forward in the hallway. Use light support from wall as needed for balance. Try to maintain your normal walking pace with completion of this exercise. Complete x 4 reps (down length of hallway).  ? ? ?Exercises ?Romberg Stance Eyes Closed on Foam Pad - 1 x daily - 5 x weekly - 1 sets - 3 reps - 30 seconds hold ?Romberg Stance on Foam Pad with Head Rotation with Eyes Closed - 1 x daily - 5 x weekly - 2 sets - 10 reps ?Romberg Stance with Head Nods on Foam Pad with Eyes Closed - 1 x daily - 5 x weekly - 2 sets - 10 reps ?Walking with Head Rotation - 1 x daily - 5 x weekly - 1 sets - 4 reps ? ? ?PATIENT EDUCATION: ?Education details: Updated HEP; New Handout Provided ?Person educated: Patient ?Education method: Explanation, Demonstration, and Handouts ?Education comprehension: verbalized understanding and returned demonstration ? ? ?HOME EXERCISE PROGRAM: ?VOR x 1 (Standing x 45 Seconds)  ? ? PT Short Term Goals - 12/10/21 0818   ? ?  ? PT SHORT TERM GOAL #1  ? Title Pt will be independent with initial vestibular/balance HEP (ALL STGs Due: at 4th Visit)   ? Baseline no HEP Established   ? Time 3   ? Period --   visits  ? Status New   ?  ? PT SHORT TERM GOAL #2  ? Title SOT TBA and LTG to be set as applicable   ?  Baseline TBA; WNL on SOT  ? Time 3   ? Period --   visits  ? Status Achieved  ? ?  ?  ? ?  ? ? ? PT Long Term Goals - 12/10/21 0818   ? ?  ? PT LONG TERM GOAL #1  ? Title Pt will be independent with final and progressive vestibular/balance HEP (ALL LTGs Due at 7th Visit)   ? Baseline No HEP established   ? Time 6   ? Period --   visits  ? Status New   ?  ? PT LONG TERM GOAL #2  ? Title LTG to be set for SOT   ? Baseline TBA; WNL on SOT  ? Time 6   ? Period --  visits  ? Status Deferred  ?  ? PT LONG TERM GOAL #3  ? Title Pt will improve al components of MSQ to </= 2/5 for improvements in dizziness and activity tolerance   ? Baseline 3-4/5   ? Time 6   ? Period --   visits  ? Status New   ?  ? PT LONG TERM GOAL #4  ? Title Pt will report being able to bend over and pick up children with no dizziness to demo improved tolerance for activity   ? Baseline mod dizziness with bending   ? Time 6   ? Period --   visits  ? Status New   ?  ? PT LONG TERM GOAL #5  ? Title Pt will improve DFS to >/= 45   ? Baseline 38.1   ? Time 6   ? Period --   visits  ? Status New   ? ?  ?  ? ?  ? ? ? Plan - 12/31/21 1309   ? ? Clinical Impression Statement Continued VOR progression today, with patient tolerating increased time with horizontal VOR. Pt did request to complete seated vs. Standing with PT agreeable. Rest of session spent focused on balance activities promoting improved vestibular input. Will continue per POC.   ? Personal Factors and Comorbidities Comorbidity 3+;Time since onset of injury/illness/exacerbation   ? Comorbidities Anemia, R Ankle Fx, Anxiety, Asthma, Bipolar, GERD, IBS, Scoliosis, Back Pain, Trigeminal Neuralgia, Depression   ? Stability/Clinical Decision Making Stable/Uncomplicated   ? Rehab Potential Good   ? PT Frequency 1x / week   ? PT Duration 6 weeks   ? PT Treatment/Interventions ADLs/Self Care Home Management;Neuromuscular re-education;Balance training;Therapeutic exercise;Therapeutic activities;Functional  mobility training;Stair training;Gait training;Patient/family education;Manual techniques;Dry needling;Canalith Repostioning;Moist Heat;Cryotherapy;Vestibular;Joint Manipulations   ? PT Next Visit Plan How

## 2022-01-09 ENCOUNTER — Encounter: Payer: Self-pay | Admitting: *Deleted

## 2022-01-09 NOTE — Telephone Encounter (Signed)
ATC x2.  LVM to return call.  Unable to reach letter sent.

## 2022-01-12 ENCOUNTER — Ambulatory Visit: Payer: Medicaid Other | Admitting: Licensed Clinical Social Worker

## 2022-01-12 DIAGNOSIS — F3181 Bipolar II disorder: Secondary | ICD-10-CM

## 2022-01-12 DIAGNOSIS — F411 Generalized anxiety disorder: Secondary | ICD-10-CM

## 2022-01-12 NOTE — Progress Notes (Signed)
Counselor/Therapist Progress Note ? ?[name], MRN: [id number],   ? ?Date: 01/12/2022 ? ?Time Spent: 45 minutes   ? ?Treatment Type: Psychotherapy ? ?Reported Symptoms:  Low mood, anxiety, anxious thoughts; low energy/fatigue  ? ?Mental Status Exam: ? ?Appearance:   Casual     ?Behavior:  Appropriate and Sharing  ?Motor:  Normal  ?Speech/Language:   Clear and Coherent and Normal Rate  ?Affect:  Appropriate, Congruent, and Full Range  ?Mood:  normal  ?Thought process:  normal  ?Thought content:    WNL  ?Sensory/Perceptual disturbances:    WNL  ?Orientation:  oriented to person, place, time/date, and situation  ?Attention:  Good  ?Concentration:  Good  ?Memory:  WNL  ?Fund of knowledge:   Good  ?Insight:    Good  ?Judgment:   Good  ?Impulse Control:  Good  ? ?Risk Assessment: ?Danger to Self:  No ?Self-injurious Behavior: No ?Danger to Others: No ?Duty to Warn:no ?Physical Aggression / Violence:No  ?Access to Firearms a concern: No  ?Gang Involvement:No  ? ?Subjective: Patient was receptive to feedback and intervention from LCSW and actively and effectively participated throughout the session. Patient is likely to benefit from future treatment because she remains motivated to improve symptoms and reports benefit of regular sessions in addressing these symptoms.   ? ?Interventions: Cognitive Behavioral Therapy ?Established psychological safety. Checked in with patient regarding their week. LCSW engaged patient in processing their thoughts and emotions about current challenges with health, parenting,and managing the home. LCSW reviewed noticing thoughts without judgement, self-care and mindfulness meditation. Provided support through active listening, validation of feelings, and highlighted patient's strengths.  ? ?Diagnosis: ?  ICD-10-CM   ?1. Generalized anxiety disorder  F41.1   ?  ?2. Mild bipolar II disorder, most recent episode major depressive (HCC)  F31.81   ?  ? ?Plan: Patient's goal is: Help  with anxiety and working through things. She reports benefit from having someone to listen to her. ?  ?Treatment Target: Increase realistic balanced thinking ?Explore patient?s thoughts, beliefs, automatic thoughts, assumptions ?Identify unhelpful thinking patterns ?Process distress and allow for emotional release ?Questioning and challenging thoughts ?Cognitive reappraisal  ? Treatment Target: Increase coping skills ?Mindfulness practices ?Acceptance practices  ?Intentional / Deep breathing ?STOP technique ?Grounding exercises ?Treatment Target: Reducing vulnerability to ?emotional mind? ?Values clarification   ?Self-care - nutrition, sleep, exercise ?Increase living within values  ?Teach distress tolerance techniques - ?what helps me?  ? ?Future Appointments  ?Date Time Provider Department Center  ?01/14/2022 12:30 PM Tempie Donning, PT [facility]  ?01/16/2022 11:30 AM Parrett, Virgel Bouquet, NP [facility] None  ?01/20/2022  4:00 PM Kathreen Cosier, LCSW [facility] None  ?01/21/2022 12:30 PM Tempie Donning, PT [facility]  ?01/28/2022 12:30 PM Tempie Donning, PT [facility]  ?02/04/2022 12:30 PM Tempie Donning, PT [facility]  ? ?Kathreen Cosier, LCSW ? ?

## 2022-01-16 ENCOUNTER — Encounter: Payer: Self-pay | Admitting: Adult Health

## 2022-01-16 ENCOUNTER — Telehealth (INDEPENDENT_AMBULATORY_CARE_PROVIDER_SITE_OTHER): Payer: Medicaid Other | Admitting: Adult Health

## 2022-01-16 DIAGNOSIS — G4719 Other hypersomnia: Secondary | ICD-10-CM | POA: Diagnosis not present

## 2022-01-16 DIAGNOSIS — R0683 Snoring: Secondary | ICD-10-CM

## 2022-01-16 NOTE — Progress Notes (Signed)
Reviewed and agree with assessment/plan. ? ? ?Braydan Marriott, MD ?[facility] ?01/16/2022, 1:27 PM ?Pager:  [phone] ? ?

## 2022-01-16 NOTE — Progress Notes (Signed)
Virtual Visit via Video Note ? ?I connected with Janet Rich on 01/16/22 at 11:30 AM EDT by a video enabled telemedicine application and verified that I am speaking with the correct person using two identifiers. ? ?Location: ?Patient: Home  ?Provider: Office  ?  ?I discussed the limitations of evaluation and management by telemedicine and the availability of in person appointments. The patient expressed understanding and agreed to proceed. ? ?History of Present Illness: ?26 year old female former smoker seen for sleep consult November 28, 2021 for snoring, daytime sleepiness and restless sleep ?Medical history significant for bipolar disorder, asthma and ADHD ? ?Today's video visit is a 1 month follow-up for daytime sleepiness, snoring and restless sleep.  Patient was seen last visit for sleep consult.  She complained of episodes of sleepiness, restless sleep snoring and gasping for air during her sleep.  Patient complained of feeling unrefreshed and tired all the time.  Patient was set up for home sleep study this was completed on December 27, 2021 that showed no significant sleep apnea with AHI at 2.7/hour and SPO2 low at 88%.  We reviewed her sleep study results.  Patient is very concerned because she is sure she has sleep apnea because she is constantly sleepy.  Has multiple episodes where she feels like she stops breathing in her sleep.  And has significant snoring.  We discussed setting her up for an in lab sleep study for further evaluation ?She would like to proceed with an in lab study. ?Patient is on sedating medications including Latuda and Lamictal ? ?Past Medical History:  ?Diagnosis Date  ? Anemia   ? with periods  ? Ankle fracture   ? right  ? Anxiety   ? Asthma   ? Back pain   ? Bipolar 1 disorder ([place])   ? Depression   ? Bipolar   ? Endometriosis   ? GERD (gastroesophageal reflux disease)   ? Headache   ? MIGRAINES  ? IBS (irritable bowel syndrome)   ? Ovarian cyst   ? PCOS (polycystic ovarian  syndrome)   ? Pneumonia   ? age 89  ? PONV (postoperative nausea and vomiting)   ? nausea after wisdom teeth extraction  ? Scoliosis   ? Vaginal Pap smear, abnormal   ? bx ok  ? ?Current Outpatient Medications on File Prior to Visit  ?Medication Sig Dispense Refill  ? acetaminophen (TYLENOL) 500 MG tablet Take 2 tablets (1,000 mg total) by mouth every 6 (six) hours as needed for moderate pain. 30 tablet 0  ? albuterol (VENTOLIN HFA) 108 (90 Base) MCG/ACT inhaler Inhale 1-2 puffs into the lungs every 6 (six) hours as needed for wheezing or shortness of breath. 1 each 3  ? ALPRAZolam (XANAX) 0.25 MG tablet Take 0.25 mg by mouth at bedtime as needed for anxiety.    ? Ascorbic Acid (VITAMIN C) 100 MG tablet Take 100 mg by mouth daily.    ? budesonide-formoterol (SYMBICORT) 80-4.5 MCG/ACT inhaler Inhale 2 puffs into the lungs 2 (two) times daily. 1 each 3  ? buPROPion (WELLBUTRIN XL) 150 MG 24 hr tablet Take 300 mg by mouth daily.    ? cholecalciferol (VITAMIN D3) 25 MCG (1000 UNIT) tablet Take 1,000 Units by mouth daily.    ? ibuprofen (ADVIL) 800 MG tablet Take 1 tablet (800 mg total) by mouth every 8 (eight) hours as needed. 30 tablet 2  ? lamoTRIgine (LAMICTAL) 200 MG tablet Take 200 mg by mouth at bedtime.    ?  lurasidone (LATUDA) 20 MG TABS tablet Take 1 tablet (20 mg total) by mouth daily. 90 tablet 3  ? MAGNESIUM MALATE PO Take 1 tablet by mouth daily at 6 (six) AM.    ? meclizine (ANTIVERT) 25 MG tablet Take 1 tablet (25 mg total) by mouth 3 (three) times daily as needed for dizziness. 30 tablet 3  ? Multiple Vitamin (MULTIVITAMIN ADULT PO) Take 1 tablet by mouth daily.    ? omeprazole (PRILOSEC) 20 MG capsule Take 1 capsule (20 mg total) by mouth daily. 30 capsule 3  ? propranolol (INDERAL) 10 MG tablet Take 1 tablet (10 mg total) by mouth 2 (two) times daily. (Patient taking differently: Take 20 mg by mouth 2 (two) times daily.) 60 tablet 1  ? vitamin B-12 (CYANOCOBALAMIN) 500 MCG tablet Take 500 mcg by mouth  daily.    ? ondansetron (ZOFRAN-ODT) 4 MG disintegrating tablet Take 1-2 tablets (4-8 mg total) by mouth every 8 (eight) hours as needed. For nausea or dizziness (Patient not taking: Reported on 01/16/2022) 30 tablet 3  ? ?No current facility-administered medications on file prior to visit.  ? ? ?  ?Observations/Objective: ?Appears well without any acute distress ? ?Assessment and Plan: ?Daytime sleepiness, snoring, restless sleep and gasping for air during her sleep.  All suspicious for underlying sleep apnea.  Patient was set up for home sleep study that did not show any significant sleep apnea with AHI at 2.7/hour..  Long discussion regarding ongoing symptoms and symptom burden.  Patient would like to proceed with an in lab sleep study for further evaluation. ? ?Plan  ?Patient Instructions  ?Set up for full sleep study at [facility] ?Healthy sleep regimen  ?Do not drive if sleepy  ?Healthy weight loss.  ?Follow up with in 4-6 weeks to discuss results and treatment plan  ?  ? ? ? ? ?Follow Up Instructions: ? ?  ?I discussed the assessment and treatment plan with the patient. The patient was provided an opportunity to ask questions and all were answered. The patient agreed with the plan and demonstrated an understanding of the instructions. ?  ?The patient was advised to call back or seek an in-person evaluation if the symptoms worsen or if the condition fails to improve as anticipated. ? ?I provided 22  minutes of non-face-to-face time during this encounter. ? ? ?Rexene Edison, NP ? ?

## 2022-01-16 NOTE — Patient Instructions (Signed)
Set up for full sleep study at [facility] ?Healthy sleep regimen  ?Do not drive if sleepy  ?Healthy weight loss.  ?Follow up with in 4-6 weeks to discuss results and treatment plan  ? ?

## 2022-01-16 NOTE — Addendum Note (Signed)
Addended by: Delrae Rend on: 01/16/2022 05:22 PM ? ? Modules accepted: Orders ? ?

## 2022-01-17 DIAGNOSIS — Z419 Encounter for procedure for purposes other than remedying health state, unspecified: Secondary | ICD-10-CM | POA: Diagnosis not present

## 2022-01-19 ENCOUNTER — Ambulatory Visit: Payer: Medicaid Other | Admitting: Licensed Clinical Social Worker

## 2022-01-19 DIAGNOSIS — F411 Generalized anxiety disorder: Secondary | ICD-10-CM

## 2022-01-19 DIAGNOSIS — F3181 Bipolar II disorder: Secondary | ICD-10-CM

## 2022-01-19 NOTE — Progress Notes (Signed)
Counselor/Therapist Progress Note ? ?[name], MRN: [id number],   ? ?Date: 01/19/2022 ? ?Time Spent: 50 minutes  ? ?Treatment Type: Psychotherapy ? ?Reported Symptoms:  Intermittent symptoms of anxiety, sleep disturbance; physical pain  ? ?Mental Status Exam: ? ?Appearance:   Casual     ?Behavior:  Appropriate and Sharing  ?Motor:  Normal  ?Speech/Language:   Clear and Coherent and Normal Rate  ?Affect:  Appropriate, Congruent, and Full Range  ?Mood:  normal  ?Thought process:  normal  ?Thought content:    WNL  ?Sensory/Perceptual disturbances:    WNL  ?Orientation:  oriented to person, place, time/date, and situation  ?Attention:  Good  ?Concentration:  Good  ?Memory:  WNL  ?Fund of knowledge:   Good  ?Insight:    Good  ?Judgment:   Good  ?Impulse Control:  Good  ? ?Risk Assessment: ?Danger to Self:  No ?Self-injurious Behavior: No ?Danger to Others: No ?Duty to Warn:no ?Physical Aggression / Violence:No  ?Access to Firearms a concern: No  ?Gang Involvement:No  ? ?Subjective: Patient was engaged and cooperative throughout the session using time effectively to discuss thoughts, feelings, and coping skills. Patient is likely to benefit from future treatment because she continued to remain motivated to decrease symptoms and reports benefit of regular sessions in addressing these symptoms.  ? ?Interventions: Cognitive Behavioral Therapy ?Established psychological safety. Checked in with patient regarding her week. Engaged patient in processing current psychosocial stressors, including trauma triggers and challenges in balancing values and health challenges. LCSW reviewed techniques to activate the parasympathetic nervous system, including ice pack on forehead, cold showers, massaging sternum and behind the ears. Also discussed the importance to engaging with others and co-regulation. Provided support through active listening, validation of feelings, and highlighted patient's strengths.    ? ?Diagnosis: ?   ICD-10-CM   ?1. Generalized anxiety disorder  F41.1   ?  ?2. Mild bipolar II disorder, most recent episode major depressive (HCC)  F31.81   ?  ? ?Plan: Patient's goal is: Help with anxiety and working through things. She reports benefit from having someone to listen to her. ?  ?Treatment Target: Increase realistic balanced thinking ?Explore patient?s thoughts, beliefs, automatic thoughts, assumptions ?Identify unhelpful thinking patterns ?Process distress and allow for emotional release ?Questioning and challenging thoughts ?Cognitive reappraisal  ? Treatment Target: Increase coping skills ?Mindfulness practices ?Acceptance practices  ?Intentional / Deep breathing ?STOP technique ?Grounding exercises ?Treatment Target: Reducing vulnerability to ?emotional mind? ?Values clarification   ?Self-care - nutrition, sleep, exercise ?Increase living within values  ?Teach distress tolerance techniques - ?what helps me?  ? ?Future Appointments  ?Date Time Provider Department Center  ?01/21/2022 12:30 PM Tempie Donning, PT [facility]  ?01/28/2022 12:30 PM Tempie Donning, PT [facility]  ?02/02/2022  1:00 PM Kathreen Cosier, LCSW [facility] None  ?02/04/2022 12:30 PM Tempie Donning, PT [facility]  ?03/09/2022  9:20 AM Dimple Casey, Jamesetta Orleans, MD [facility] None  ? ?Kathreen Cosier, LCSW ? ?

## 2022-01-20 ENCOUNTER — Ambulatory Visit: Payer: Medicaid Other | Admitting: Licensed Clinical Social Worker

## 2022-01-21 ENCOUNTER — Ambulatory Visit: Payer: Medicaid Other

## 2022-01-26 NOTE — Therapy (Signed)
[facility] ?[address] ?[place], [place], [address] ?Phone: [phone]   Fax:  [phone] ? ?Patient Details  ?Name: Ramiya Delahunty ?MRN: [id number] ?Date of Birth: 1995/10/28 ?Referring Provider:  No ref. provider found ? ?Encounter Date: 01/26/2022 ? ?PHYSICAL THERAPY NON VISIT DISCHARGE SUMMARY ? ?Visits from Start of Care: 3 ? ?Current functional level related to goals / functional outcomes: ?Patient has made  ?  ?Remaining deficits: ?Dizziness ?  ?Education / Equipment: ?HEP Provided  ? ?Patient agrees to discharge. Patient goals were  unable to assess progress toward goals due to patient not returning. . Patient is being discharged due to the patient's request. ? ? PT Short Term Goals - 12/10/21 0818   ? ?  ? PT SHORT TERM GOAL #1  ? Title Pt will be independent with initial vestibular/balance HEP (ALL STGs Due: at 4th Visit)   ? Baseline no HEP Established   ? Time 3   ? Period --   visits  ? Status New   ?  ? PT SHORT TERM GOAL #2  ? Title SOT TBA and LTG to be set as applicable   ? Baseline TBA   ? Time 3   ? Period --   visits  ? Status New   ? ?  ?  ? ?  ? ? PT Long Term Goals - 12/10/21 0818   ? ?  ? PT LONG TERM GOAL #1  ? Title Pt will be independent with final and progressive vestibular/balance HEP (ALL LTGs Due at 7th Visit)   ? Baseline No HEP established   ? Time 6   ? Period --   visits  ? Status New   ?  ? PT LONG TERM GOAL #2  ? Title LTG to be set for SOT   ? Baseline TBA   ? Time 6   ? Period --   visits  ? Status New   ?  ? PT LONG TERM GOAL #3  ? Title Pt will improve al components of MSQ to </= 2/5 for improvements in dizziness and activity tolerance   ? Baseline 3-4/5   ? Time 6   ? Period --   visits  ? Status New   ?  ? PT LONG TERM GOAL #4  ? Title Pt will report being able to bend over and pick up children with no dizziness to demo improved tolerance for activity   ? Baseline mod dizziness with bending   ? Time 6   ?  Period --   visits  ? Status New   ?  ? PT LONG TERM GOAL #5  ? Title Pt will improve DFS to >/= 45   ? Baseline 38.1   ? Time 6   ? Period --   visits  ? Status New   ? ?  ?  ? ?  ? ? ? ?Tempie Donning, PT, DPT ?01/26/2022, 12:04 PM ? ?[facility] ?[address] ?[place], [place], [address] ?Phone: [phone]   Fax:  [phone] ?

## 2022-01-28 ENCOUNTER — Ambulatory Visit: Payer: Medicaid Other

## 2022-01-28 DIAGNOSIS — F3181 Bipolar II disorder: Secondary | ICD-10-CM | POA: Diagnosis not present

## 2022-02-02 ENCOUNTER — Ambulatory Visit: Payer: Medicaid Other | Admitting: Licensed Clinical Social Worker

## 2022-02-02 DIAGNOSIS — M5416 Radiculopathy, lumbar region: Secondary | ICD-10-CM | POA: Diagnosis not present

## 2022-02-02 DIAGNOSIS — M549 Dorsalgia, unspecified: Secondary | ICD-10-CM | POA: Diagnosis not present

## 2022-02-02 DIAGNOSIS — M5136 Other intervertebral disc degeneration, lumbar region: Secondary | ICD-10-CM | POA: Diagnosis not present

## 2022-02-02 DIAGNOSIS — M542 Cervicalgia: Secondary | ICD-10-CM | POA: Diagnosis not present

## 2022-02-03 ENCOUNTER — Other Ambulatory Visit: Payer: Self-pay | Admitting: Family Medicine

## 2022-02-03 DIAGNOSIS — M5416 Radiculopathy, lumbar region: Secondary | ICD-10-CM

## 2022-02-08 ENCOUNTER — Other Ambulatory Visit: Payer: Medicaid Other

## 2022-02-09 ENCOUNTER — Ambulatory Visit: Payer: Medicaid Other | Admitting: Licensed Clinical Social Worker

## 2022-02-11 DIAGNOSIS — F3181 Bipolar II disorder: Secondary | ICD-10-CM | POA: Diagnosis not present

## 2022-02-11 DIAGNOSIS — F900 Attention-deficit hyperactivity disorder, predominantly inattentive type: Secondary | ICD-10-CM | POA: Diagnosis not present

## 2022-02-11 DIAGNOSIS — F411 Generalized anxiety disorder: Secondary | ICD-10-CM | POA: Diagnosis not present

## 2022-02-16 DIAGNOSIS — Z419 Encounter for procedure for purposes other than remedying health state, unspecified: Secondary | ICD-10-CM | POA: Diagnosis not present

## 2022-02-24 ENCOUNTER — Encounter: Payer: Self-pay | Admitting: Nurse Practitioner

## 2022-02-24 ENCOUNTER — Other Ambulatory Visit: Payer: Self-pay

## 2022-02-24 ENCOUNTER — Telehealth (INDEPENDENT_AMBULATORY_CARE_PROVIDER_SITE_OTHER): Payer: Medicaid Other | Admitting: Nurse Practitioner

## 2022-02-24 DIAGNOSIS — R1013 Epigastric pain: Secondary | ICD-10-CM

## 2022-02-24 DIAGNOSIS — K219 Gastro-esophageal reflux disease without esophagitis: Secondary | ICD-10-CM | POA: Diagnosis not present

## 2022-02-24 MED ORDER — FAMOTIDINE 20 MG PO TABS: 20.0000 mg | ORAL_TABLET | Freq: Every day | ORAL | 1 refills | Status: DC

## 2022-02-24 MED ORDER — OMEPRAZOLE 20 MG PO CPDR: 20.0000 mg | DELAYED_RELEASE_CAPSULE | Freq: Every day | ORAL | 1 refills | Status: DC

## 2022-02-24 NOTE — Progress Notes (Signed)
? ?Name: Janet Rich   MRN: [id number]    DOB: 26-May-1996   Date:02/24/2022 ? ?     Progress Note ? ?Subjective ? ?Chief Complaint ? ?Chief Complaint  ?Patient presents with  ? Abdominal Pain  ? Gastroesophageal Reflux  ? ? ?I connected with  Janet Rich  on 02/24/22 at  1:00 PM EDT by a video enabled telemedicine application and verified that I am speaking with the correct person using two identifiers.  I discussed the limitations of evaluation and management by telemedicine and the availability of in person appointments. The patient expressed understanding and agreed to proceed with a virtual visit  Staff also discussed with the patient that there may be a patient responsible charge related to this service. ?Patient Location: home ?Provider Location: [facility] ?Additional Individuals present: alone ? ?HPI ? ?Abdominal pain/ GERD: Patient reports last week she had the stomach bug.  She says she was pretty much feeling better but now she feels like her acid reflux is out of control.  She says she feels like her abdominal pain is associated with her acid reflux.  Abdominal pain is in the epigastric area.  She denies any fever, urinary symptoms, diarrhea, constipation, vomiting.  Continues to take omeprazole in the morning.  She tries to avoid food triggers.  Recommend trying Pepcid at night to see if that makes a difference.  If she has no improvement she will follow-up in person. ? ?Mobility issues/ right ankle pain:  She is requesting a handicap sticker due to continued right ankle pain since surgery in 2020.  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 [place]opedic [place] in [place].  She also has a history of fibromyalgia with chronic pain. She sees a new rheumatologist at the end of this month.  She says that she has a handicap sticker but it is expiring and she needs a new one.  New one filled out and at front desk for patient to pick up.  ? ?Patient Active Problem List  ? Diagnosis  Date Noted  ? Excessive daytime sleepiness 11/28/2021  ? Endometriosis determined by laparoscopy 05/08/2021  ? Myalgia 03/11/2021  ? Elevated BP without diagnosis of hypertension 01/08/2021  ? Dichorionic diamniotic twin pregnancy in third trimester 08/14/2020  ? Preeclampsia 08/14/2020  ? Twin pregnancy delivered vaginally 08/14/2020  ? Preterm labor 06/17/2020  ? Right trigeminal neuralgia 11/08/2019  ? Endometriosis 03/27/2019  ? PCOS (polycystic ovarian syndrome) 03/27/2019  ? Moderate persistent asthma 03/07/2019  ? Post-nasal drainage 03/07/2019  ? Irritable bowel syndrome with diarrhea 03/07/2019  ? Gastroesophageal reflux disease without esophagitis 03/07/2019  ? Class 2 obesity due to excess calories without serious comorbidity with body mass index (BMI) of 38.0 to 38.9 in adult 09/02/2018  ? Bipolar 2 disorder (HCC) 08/12/2018  ? History of low birth weight 03/11/2018  ? Vulvar atrophy 03/11/2018  ? History of migraine headaches 05/01/2015  ? Anxiety and depression 05/01/2015  ? ? ?Social History  ? ?Tobacco Use  ? Smoking status: Former  ?  Types: Cigarettes  ?  Quit date: 12/20/2009  ?  Years since quitting: 12.1  ? Smokeless tobacco: Never  ? Tobacco comments:  ?  age [age], quit  ?Substance Use Topics  ? Alcohol use: Yes  ?  Comment: occasionally  ? ? ? ?Current Outpatient Medications:  ?  acetaminophen (TYLENOL) 500 MG tablet, Take 2 tablets (1,000 mg total) by mouth every 6 (six) hours as needed for moderate  pain., Disp: 30 tablet, Rfl: 0 ?  albuterol (VENTOLIN HFA) 108 (90 Base) MCG/ACT inhaler, Inhale 1-2 puffs into the lungs every 6 (six) hours as needed for wheezing or shortness of breath., Disp: 1 each, Rfl: 3 ?  ALPRAZolam (XANAX) 0.25 MG tablet, Take 0.25 mg by mouth at bedtime as needed for anxiety., Disp: , Rfl:  ?  Ascorbic Acid (VITAMIN C) 100 MG tablet, Take 100 mg by mouth daily., Disp: , Rfl:  ?  cholecalciferol (VITAMIN D3) 25 MCG (1000 UNIT) tablet, Take 1,000 Units by mouth daily.,  Disp: , Rfl:  ?  famotidine (PEPCID) 20 MG tablet, Take 1 tablet (20 mg total) by mouth at bedtime., Disp: 90 tablet, Rfl: 1 ?  ibuprofen (ADVIL) 800 MG tablet, Take 1 tablet (800 mg total) by mouth every 8 (eight) hours as needed., Disp: 30 tablet, Rfl: 2 ?  lamoTRIgine (LAMICTAL) 200 MG tablet, Take 200 mg by mouth at bedtime., Disp: , Rfl:  ?  lurasidone (LATUDA) 20 MG TABS tablet, Take 1 tablet (20 mg total) by mouth daily., Disp: 90 tablet, Rfl: 3 ?  MAGNESIUM MALATE PO, Take 1 tablet by mouth daily at 6 (six) AM., Disp: , Rfl:  ?  meclizine (ANTIVERT) 25 MG tablet, Take 1 tablet (25 mg total) by mouth 3 (three) times daily as needed for dizziness., Disp: 30 tablet, Rfl: 3 ?  Multiple Vitamin (MULTIVITAMIN ADULT PO), Take 1 tablet by mouth daily., Disp: , Rfl:  ?  ondansetron (ZOFRAN-ODT) 4 MG disintegrating tablet, Take 1-2 tablets (4-8 mg total) by mouth every 8 (eight) hours as needed. For nausea or dizziness, Disp: 30 tablet, Rfl: 3 ?  propranolol (INDERAL) 10 MG tablet, Take 1 tablet (10 mg total) by mouth 2 (two) times daily. (Patient taking differently: Take 20 mg by mouth 2 (two) times daily.), Disp: 60 tablet, Rfl: 1 ?  vitamin B-12 (CYANOCOBALAMIN) 500 MCG tablet, Take 500 mcg by mouth daily., Disp: , Rfl:  ?  VYVANSE 20 MG capsule, Take 20 mg by mouth every morning., Disp: , Rfl:  ?  omeprazole (PRILOSEC) 20 MG capsule, Take 1 capsule (20 mg total) by mouth daily., Disp: 90 capsule, Rfl: 1 ? ?Allergies  ?Allergen Reactions  ? Amoxicillin Shortness Of Breath and Nausea Only  ?  Dizziness  ? Other   ?  Must have anti-emetic prior to being given any narcotics   ? Latex Rash  ? ? ?I personally reviewed active problem list, medication list, allergies, notes from last encounter with the patient/caregiver today. ? ?ROS ? ?Constitutional: Negative for fever or weight change.  ?Respiratory: Negative for cough and shortness of breath.   ?Cardiovascular: Negative for chest pain or palpitations.   ?Gastrointestinal: positive for abdominal pain, no bowel changes.  ?Musculoskeletal: positive for gait problem or joint swelling.  ?Skin: Negative for rash.  ?Neurological: Negative for dizziness or headache.  ?No other specific complaints in a complete review of systems (except as listed in HPI above).  ? ?Objective ? ?Virtual encounter, vitals not obtained. ? ?There is no height or weight on file to calculate BMI. ? ?Nursing Note and Vital Signs reviewed. ? ?Physical Exam ? ?Awake, alert and oriented, speaking in complete sentences ? ?No results found for this or any previous visit (from the past 72 hour(s)). ? ?Assessment & Plan ? ?1. Gastroesophageal reflux disease without esophagitis ?Comments: Worsening acid reflux.  We will add on Pepcid at night.  Continue taking omeprazole in the a.m.  Continue to avoid  triggers. ?- famotidine (PEPCID) 20 MG tablet; Take 1 tablet (20 mg total) by mouth at bedtime.  Dispense: 90 tablet; Refill: 1 ?- omeprazole (PRILOSEC) 20 MG capsule; Take 1 capsule (20 mg total) by mouth daily.  Dispense: 90 capsule; Refill: 1 ? ?2. Epigastric pain ?Comments: If no improvement with Pepcid and omeprazol, follow-up with in person appointment ?  ? ?-Red flags and when to present for emergency care or RTC including fever >101.25F, chest pain, shortness of breath, new/worsening/un-resolving symptoms,  reviewed with patient at time of visit. Follow up and care instructions discussed and provided in AVS. ?- I discussed the assessment and treatment plan with the patient. The patient was provided an opportunity to ask questions and all were answered. The patient agreed with the plan and demonstrated an understanding of the instructions. ? ?I provided 20 minutes of non-face-to-face time during this encounter. ? ?Berniece Salines, FNP ? ?  ?

## 2022-03-03 ENCOUNTER — Ambulatory Visit: Payer: Medicaid Other | Admitting: Licensed Clinical Social Worker

## 2022-03-03 DIAGNOSIS — F3181 Bipolar II disorder: Secondary | ICD-10-CM

## 2022-03-03 DIAGNOSIS — F411 Generalized anxiety disorder: Secondary | ICD-10-CM

## 2022-03-03 NOTE — Progress Notes (Signed)
Counselor/Therapist Progress Note ? ?[name], MRN: [id number],   ? ?Date: 03/03/2022 ? ?Time Spent: 30 minutes  ? ?Treatment Type: Psychotherapy ? ?Reported Symptoms:  nightmares, "night terrors",  depressed mood, anxiety ? ?Mental Status Exam: ? ?Appearance:   Casual     ?Behavior:  Appropriate and Sharing  ?Motor:  Normal  ?Speech/Language:   Clear and Coherent and Normal Rate  ?Affect:  Appropriate, Congruent, and Full Range  ?Mood:  normal  ?Thought process:  normal  ?Thought content:    WNL  ?Sensory/Perceptual disturbances:    WNL  ?Orientation:  oriented to person, place, time/date, and situation  ?Attention:  Good  ?Concentration:  Good  ?Memory:  WNL  ?Fund of knowledge:   Good  ?Insight:    Good  ?Judgment:   Good  ?Impulse Control:  Good  ? ?Risk Assessment: ?Danger to Self:  No ?Self-injurious Behavior: No ?Danger to Others: No ?Duty to Warn:no ?Physical Aggression / Violence:No  ?Access to Firearms a concern: No  ?Gang Involvement:No  ? ?Subjective: Patient was engaged and cooperative throughout the session using time effectively to discuss thoughts and feelings. Patient was receptive to feedback and intervention from LCSW.  Patient reports continued challenges with depression and anxiety over the last several weeks. She reports continued motivation for treatment and voices resistance to the idea of transferring care due to lack of improvement in symptoms.  ?  ?Interventions: Cognitive Behavioral Therapy ?Established psychological safety. Checked in with patient and conducted brief assessment on current symptoms. Discussed treatment options to address patient's persistent depressive symptoms and anxiety.  Reviewed new wave cognitive strategies including DBT and ACT. LCSW encouraged patient to be more open to emotions, and to engage in meaningful tasks/activities. Provided support through active listening, validation of feelings, and highlighted patient's strengths.  ? ?Diagnosis: ?   ICD-10-CM   ?1. Generalized anxiety disorder  F41.1   ?  ?2. Mild bipolar II disorder, most recent episode major depressive (HCC)  F31.81   ?  ? ?Plan: Patient's goal is: Help with anxiety and working through things. She reports benefit from having someone to listen to her. ?  ?Treatment Target: Increase realistic balanced thinking ?Explore patient?s thoughts, beliefs, automatic thoughts, assumptions ?Identify unhelpful thinking patterns ?Process distress and allow for emotional release ?Questioning and challenging thoughts ?Cognitive reappraisal  ? Treatment Target: Increase coping skills ?Mindfulness practices ?Acceptance practices  ?Intentional / Deep breathing ?STOP technique ?Grounding exercises ?Treatment Target: Reducing vulnerability to ?emotional mind? ?Values clarification   ?Self-care - nutrition, sleep, exercise ?Increase living within values  ?Teach distress tolerance techniques - ?what helps me?  ? ?Future Appointments  ?Date Time Provider Department Center  ?03/09/2022  9:20 AM Rice, Jamesetta [place], MD [facility] None  ?03/09/2022  1:00 PM Kathreen Cosier, LCSW [facility] None  ? ?Kathreen Cosier, LCSW ? ?

## 2022-03-08 NOTE — Progress Notes (Signed)
Office Visit Note  Patient: Janet Rich             Date of Birth: 1996/02/16           MRN: [id number]             PCP: Bo Merino, FNP Referring: Bo Merino, FNP Visit Date: 03/09/2022 Occupation: Probation officer  Subjective:  New Patient (Initial Visit) (Abnormal labs)   History of Present Illness: Janet Rich is a 26 y.o. female here for joint and muscle pains with elevated sedimentation rate previously seeing Dr. Jefm Bryant for fibromyalgia on gabapentin and working with chiropractor and physiatry for back pain. She has chronic back pain since at least about 10 years ago with scoliosis and then pain worse after some injury catching a tossed friend and then with some mild fall or hyperextension at home. This has caused pain much of the time with some variation. She started to have more generalized body and muscle pains especially in bilateral legs since her mid 38s. She saw Dr. Jefm Bryant for this problem no inflammatory arthritis issues identified but looked consistent with FMS. She has very frequent migraine headaches that she treats with ibuprofen, chronic IBS diarrhea symptoms, fatigue, and body pain and sensitivity. She started gabapentin with symptoms improvement. She was recommended to take this up to TID but only takes at night due to somnolence and fatigue when taking in day time. She was referred and saw physiatry with addition of night time tizanidine which is helpful and allows more sleep. Most recent lab testing in PCP office with mildly elevated ESR 22 and CRP 9.2 without specific known underlying cause.  Activities of Daily Living:  Patient reports morning stiffness for 0  none .   Patient Reports nocturnal pain.  Difficulty dressing/grooming: Denies Difficulty climbing stairs: Reports Difficulty getting out of chair: Denies Difficulty using hands for taps, buttons, cutlery, and/or writing: Reports  Review of Systems  Constitutional:  Positive for fatigue.  HENT:   Negative for mouth dryness.   Eyes:  Negative for dryness.  Respiratory:  Negative for shortness of breath.   Cardiovascular:  Negative for swelling in legs/feet.  Gastrointestinal:  Negative for constipation.  Endocrine: Positive for cold intolerance and heat intolerance.  Genitourinary:  Negative for difficulty urinating.  Musculoskeletal:  Positive for joint pain, joint pain and muscle tenderness.  Skin:  Negative for rash.  Allergic/Immunologic: Positive for susceptible to infections.  Neurological:  Negative for numbness.  Hematological:  Negative for bruising/bleeding tendency.  Psychiatric/Behavioral:  Positive for sleep disturbance.    PMFS History:  Patient Active Problem List   Diagnosis Date Noted   Fibromyalgia syndrome 03/09/2022   Chronic back pain 03/09/2022   Excessive daytime sleepiness 11/28/2021   Endometriosis determined by laparoscopy 05/08/2021   Myalgia 03/11/2021   Elevated BP without diagnosis of hypertension 01/08/2021   Dichorionic diamniotic twin pregnancy in third trimester 08/14/2020   Preeclampsia 08/14/2020   Twin pregnancy delivered vaginally 08/14/2020   Preterm labor 06/17/2020   Right trigeminal neuralgia 11/08/2019   Endometriosis 03/27/2019   PCOS (polycystic ovarian syndrome) 03/27/2019   Moderate persistent asthma 03/07/2019   Post-nasal drainage 03/07/2019   Irritable bowel syndrome with diarrhea 03/07/2019   Gastroesophageal reflux disease without esophagitis 03/07/2019   Class 2 obesity due to excess calories without serious comorbidity with body mass index (BMI) of 38.0 to 38.9 in adult 09/02/2018   Bipolar 2 disorder ([place]) 08/12/2018   History of low birth weight 03/11/2018  Vulvar atrophy 03/11/2018   History of migraine headaches 05/01/2015   Anxiety and depression 05/01/2015    Past Medical History:  Diagnosis Date   Anemia    with periods   Ankle fracture    right   Anxiety    Asthma    Back pain    Bipolar 1 disorder  (HCC)    Depression    Bipolar    Endometriosis    Fibromyalgia    GERD (gastroesophageal reflux disease)    Headache    MIGRAINES   IBS (irritable bowel syndrome)    Ovarian cyst    PCOS (polycystic ovarian syndrome)    Pneumonia    age 70   PONV (postoperative nausea and vomiting)    nausea after wisdom teeth extraction   Scoliosis    Vaginal Pap smear, abnormal    bx ok    Family History  Problem Relation Age of Onset   Endometriosis Mother    Bipolar disorder Mother    Diabetes Mother        prediabetes   Breast cancer Paternal Grandmother    Cancer Paternal Grandmother    Depression Paternal Grandmother    Schizophrenia Father    Depression Father    Heart disease Neg Hx    Colon cancer Neg Hx    Ovarian cancer Neg Hx    Past Surgical History:  Procedure Laterality Date   ABDOMINAL HYSTERECTOMY     FRACTURE SURGERY     ankle surgery with impant    HYSTEROSCOPY WITH D & C N/A 09/22/2019   Procedure: DILATATION AND CURETTAGE /HYSTEROSCOPY;  Surgeon: Ward, Honor Loh, MD;  Location: [facility];  Service: Gynecology;  Laterality: N/A;   LYSIS OF ADHESION N/A 05/08/2021   Procedure: LYSIS OF ADHESION, EXCISION OF ENDOMETRIOSIS;  Surgeon: Benjaman Kindler, MD;  Location: [facility];  Service: Gynecology;  Laterality: N/A;   ROBOTIC ASSISTED DIAGNOSTIC LAPAROSCOPY N/A 02/18/2017   Procedure: ROBOTIC ASSISTED DIAGNOSTIC LAPAROSCOPY,EXCISION AND ABLATION OF ENDOMETRIOSIS,LYSIS OF ADHESIONS;  Surgeon: Brien Few, MD;  Location: [facility];  Service: Gynecology;  Laterality: N/A;   ROBOTIC ASSISTED LAPAROSCOPIC HYSTERECTOMY AND SALPINGECTOMY Bilateral 05/08/2021   Procedure: XI ROBOTIC ASSISTED TOTAL LAPAROSCOPIC HYSTERECTOMY AND SALPINGECTOMY;  Surgeon: Benjaman Kindler, MD;  Location: [facility];  Service: Gynecology;  Laterality: Bilateral;   VAGINAL DELIVERY N/A 08/14/2020   Procedure: VAGINAL DELIVERY;  Surgeon: Ouida Sills Gwen Her, MD;  Location: [facility];  Service:  Obstetrics;  Laterality: N/A;   WISDOM TOOTH EXTRACTION  2013   Social History   Social History Narrative   Lives at home with husband, son, and 75 m.o. twin girls    Right handed   Currently [redacted] weeks pregnant with twins (as of 01/17/2020)  ---update 12/05/2021 twins are now 51 months old    Caffeine: every once in awhile    Immunization History  Administered Date(s) Administered   Influenza,inj,Quad PF,6+ Mos 07/03/2020, 08/18/2021   Influenza-Unspecified 11/29/2017, 06/30/2021   Moderna Covid-19 Vaccine Bivalent Booster 77yr & up 07/12/2021   PFIZER Comirnaty(Gray Top)Covid-19 Tri-Sucrose Vaccine 08/01/2020   PFIZER(Purple Top)SARS-COV-2 Vaccination 08/01/2020   Tdap 11/29/2017, 03/06/2019, 06/19/2020   Unspecified SARS-COV-2 Vaccination 01/02/2020, 01/31/2020   Varicella 08/16/2020     Objective: Vital Signs: BP 113/69 (BP Location: Right Arm, Patient Position: Sitting, Cuff Size: Normal)   Pulse 71   Resp 14   Ht 5' 1"  (1.549 m)   Wt 205 lb (93 kg)   LMP 04/08/2021   BMI 38.73 kg/m  Physical Exam Constitutional:      Appearance: She is obese.  Cardiovascular:     Rate and Rhythm: Normal rate and regular rhythm.  Pulmonary:     Effort: Pulmonary effort is normal.     Breath sounds: Normal breath sounds.  Musculoskeletal:     Right lower leg: No edema.     Left lower leg: No edema.  Skin:    General: Skin is warm and dry.     Findings: No rash.  Neurological:     Mental Status: She is alert.  Psychiatric:        Mood and Affect: Mood normal.     Musculoskeletal Exam:  Shoulders full ROM no tenderness or swelling Elbows slight hyperextensibility no tenderness or swelling Wrists full ROM no tenderness or swelling Fingers full ROM no tenderness or swelling Bilateral paraspinal muscle tenderness worst in upper back and lumbosacral area, left side tenderness in middle back, does have some radiation of pain just along the back Bilateral hip tenderness to  pressure, lateral pain with FADIR movement Knees no tenderness or swelling mild hyperextensibility Ankles full ROM no tenderness or swelling well healed right ankle surgical scar   Investigation: No additional findings.  Imaging: No results found.  Recent Labs: Lab Results  Component Value Date   WBC 9.6 11/13/2021   HGB 13.2 11/13/2021   PLT 375 11/13/2021   NA 137 10/09/2021   K 3.9 10/09/2021   CL 101 10/09/2021   CO2 27 10/09/2021   GLUCOSE 114 (H) 10/09/2021   BUN 10 10/09/2021   CREATININE 0.59 10/09/2021   BILITOT 0.3 10/09/2021   ALKPHOS 66 06/21/2021   AST 11 10/09/2021   ALT 14 10/09/2021   PROT 7.3 10/09/2021   ALBUMIN 3.8 06/21/2021   CALCIUM 9.5 10/09/2021   GFRAA >60 03/01/2020    Speciality Comments: No specialty comments available.  Procedures:  No procedures performed Allergies: Amoxicillin, Other, and Latex   Assessment / Plan:     Visit Diagnoses: Fibromyalgia syndrome  I agree with previous diagnosis of fibromyalgia syndrome.  She describes a typical constellation of features and with significant myofascial tenderness and some radiation of pain on physical exam.  Do not see any inflammatory changes on exam of the joints or skin or specific findings from her history.  Therefore I am not recommending any further work-up for mildly elevated inflammatory markers, and previous disease specific rheumatology test were negative.  She is already seeing physiatry and getting scheduled for physical therapy to work on this as a first-line option.  Recommended her to review the online [employer] for self-management ideas.  She is having some benefit with the gabapentin and tizanidine at night without on daytime medication due to sedation side effect.  I discussed there are some additional medication options for possible treatment of fibromyalgia syndrome including SNRI TCA medications.  I would recommend her to review these  probably with her psychiatrist for possible drug interactions or interfering withcontrol of bipolar disorder.  Chronic bilateral back pain, unspecified back location  Spine appears to be a major underlying factor for a lot of her chronic pain and mobility or functional limitations.  She is already been referred to physical therapy but not started working with them.  Also has been seeing chiropractor and will benefit from continued work on stability and range of motion considering the chronic pain and paraspinal muscle tenderness on exam.  Irritable bowel syndrome with diarrhea  She describes constellation  of symptoms typical association for fibromyalgia syndrome.  Including frequent migraines, concentration difficulty, irritable bowel symptoms, and chronic fatigue.  Orders: No orders of the defined types were placed in this encounter.  No orders of the defined types were placed in this encounter.    Follow-Up Instructions: No follow-ups on file.   Collier Salina, MD  Note - This record has been created using [employer].  Chart creation errors have been sought, but may not always  have been located. Such creation errors do not reflect on  the standard of medical care.

## 2022-03-09 ENCOUNTER — Ambulatory Visit (INDEPENDENT_AMBULATORY_CARE_PROVIDER_SITE_OTHER): Payer: Medicaid Other | Admitting: Internal Medicine

## 2022-03-09 ENCOUNTER — Ambulatory Visit: Payer: Medicaid Other | Admitting: Licensed Clinical Social Worker

## 2022-03-09 ENCOUNTER — Encounter: Payer: Self-pay | Admitting: Internal Medicine

## 2022-03-09 VITALS — BP 113/69 | HR 71 | Resp 14 | Ht 61.0 in | Wt 205.0 lb

## 2022-03-09 DIAGNOSIS — M549 Dorsalgia, unspecified: Secondary | ICD-10-CM | POA: Diagnosis not present

## 2022-03-09 DIAGNOSIS — M797 Fibromyalgia: Secondary | ICD-10-CM | POA: Insufficient documentation

## 2022-03-09 DIAGNOSIS — F3181 Bipolar II disorder: Secondary | ICD-10-CM

## 2022-03-09 DIAGNOSIS — K580 Irritable bowel syndrome with diarrhea: Secondary | ICD-10-CM

## 2022-03-09 DIAGNOSIS — G8929 Other chronic pain: Secondary | ICD-10-CM

## 2022-03-09 DIAGNOSIS — F411 Generalized anxiety disorder: Secondary | ICD-10-CM

## 2022-03-09 NOTE — Patient Instructions (Addendum)
I recommend checking out the [place] [facility] [place] patient-centered guide for fibromyalgia and chronic pain management: [email or web address].[email or web address]/  I agree with current medications including tizanidine and gabapentin for FMS symptom management. Physical therapy is usually beneficial for this as well for which you were already referred.  Some of the most common maintenance treatment options include SNRI medications such as Lyrica, Cymbalta, or Savella but I would discuss any changes with your psychiatrist due to possibly interacting with other meds.

## 2022-03-09 NOTE — Progress Notes (Signed)
Counselor/Therapist Progress Note  [name], MRN: [id number],    Date: 03/09/2022  Time Spent: 15 minutes  (patient arrived to Zoom late)   Treatment Type: Psychotherapy  Reported Symptoms:  Anxiety, anxious thoughts  Mental Status Exam:  Appearance:   Casual     Behavior:  Appropriate and Sharing  Motor:  Normal  Speech/Language:   Clear and Coherent and Normal Rate  Affect:  Appropriate, Congruent, and Full Range  Mood:  normal  Thought process:  normal  Thought content:    WNL  Sensory/Perceptual disturbances:    WNL  Orientation:  oriented to person, place, time/date, and situation  Attention:  Good  Concentration:  Good  Memory:  WNL  Fund of knowledge:   Good  Insight:    Good  Judgment:   Good  Impulse Control:  Good   Risk Assessment: Danger to Self:  No Self-injurious Behavior: No Danger to Others: No Duty to Warn:no Physical Aggression / Violence:No  Access to Firearms a concern: No  Gang Involvement:No   Subjective: Patient was receptive to feedback and intervention from LCSW.  Patient is likely to benefit from future treatment because she reprots continued motivation and reports benefit of regular sessions.    Interventions: Cognitive Behavioral Therapy Established psychological safety. Checked in with patient and reviewed previous session regarding engagement in values driven behaviors with acceptance of emotions. LCSW praised patient for engagement in activities while holding awareness and space for anxiety and anxious thoughts. Provided support through active listening, validation of feelings, and highlighted patient's strengths.   Diagnosis:   ICD-10-CM   1. Generalized anxiety disorder  F41.1     2. Mild bipolar II disorder, most recent episode major depressive (HCC)  F31.81      Plan: Patient's goal is: Help with anxiety and working through things. She reports benefit from having someone to listen to her.   Treatment Target:  Increase realistic balanced thinking Explore patient's thoughts, beliefs, automatic thoughts, assumptions Identify unhelpful thinking patterns Process distress and allow for emotional release Questioning and challenging thoughts Cognitive reappraisal   Treatment Target: Increase coping skills Mindfulness practices Acceptance practices  Intentional / Deep breathing STOP technique Grounding exercises Treatment Target: Reducing vulnerability to "emotional mind" Values clarification   Self-care - nutrition, sleep, exercise Increase living within values  Teach distress tolerance techniques - "what helps me"   Future Appointments  Date Time Provider Department Center  03/23/2022  3:10 PM Janet Cosier, LCSW [facility] None   Janet Cosier, LCSW

## 2022-03-11 DIAGNOSIS — F411 Generalized anxiety disorder: Secondary | ICD-10-CM | POA: Diagnosis not present

## 2022-03-11 DIAGNOSIS — F3181 Bipolar II disorder: Secondary | ICD-10-CM | POA: Diagnosis not present

## 2022-03-16 ENCOUNTER — Ambulatory Visit
Admission: EM | Admit: 2022-03-16 | Discharge: 2022-03-16 | Disposition: A | Discharge status: Home / Self Care | Payer: Medicaid Other | Attending: Internal Medicine | Admitting: Internal Medicine

## 2022-03-16 ENCOUNTER — Encounter: Payer: Self-pay | Admitting: Emergency Medicine

## 2022-03-16 DIAGNOSIS — J029 Acute pharyngitis, unspecified: Secondary | ICD-10-CM | POA: Diagnosis not present

## 2022-03-16 LAB — POCT RAPID STREP A (OFFICE): Rapid Strep A Screen: NEGATIVE

## 2022-03-16 MED ORDER — CHLORASEPTIC 1.4 % MT LIQD: 1.0000 | OROMUCOSAL | 0 refills | Status: DC | PRN

## 2022-03-16 NOTE — ED Provider Notes (Signed)
[facility]    CSN: [id number] Arrival date & time: 03/16/22  0955      History   Chief Complaint Chief Complaint  Patient presents with   Sore Throat    HPI Janet Rich is a 26 y.o. female.   Patient presents with concerns for strep throat as she has had sore throat that started yesterday.  Denies any other associated upper respiratory symptoms, cough, fever.  Patient has not taken any medications to help alleviate symptoms.  Denies any known sick contacts.   Sore Throat   Past Medical History:  Diagnosis Date   Anemia    with periods   Ankle fracture    right   Anxiety    Asthma    Back pain    Bipolar 1 disorder (HCC)    Depression    Bipolar    Endometriosis    Fibromyalgia    GERD (gastroesophageal reflux disease)    Headache    MIGRAINES   IBS (irritable bowel syndrome)    Ovarian cyst    PCOS (polycystic ovarian syndrome)    Pneumonia    age 69   PONV (postoperative nausea and vomiting)    nausea after wisdom teeth extraction   Scoliosis    Vaginal Pap smear, abnormal    bx ok    Patient Active Problem List   Diagnosis Date Noted   Fibromyalgia syndrome 03/09/2022   Chronic back pain 03/09/2022   Excessive daytime sleepiness 11/28/2021   Endometriosis determined by laparoscopy 05/08/2021   Myalgia 03/11/2021   Elevated BP without diagnosis of hypertension 01/08/2021   Dichorionic diamniotic twin pregnancy in third trimester 08/14/2020   Preeclampsia 08/14/2020   Twin pregnancy delivered vaginally 08/14/2020   Preterm labor 06/17/2020   Right trigeminal neuralgia 11/08/2019   Endometriosis 03/27/2019   PCOS (polycystic ovarian syndrome) 03/27/2019   Moderate persistent asthma 03/07/2019   Post-nasal drainage 03/07/2019   Irritable bowel syndrome with diarrhea 03/07/2019   Gastroesophageal reflux disease without esophagitis 03/07/2019   Class 2 obesity due to excess calories without serious comorbidity with body mass index  (BMI) of 38.0 to 38.9 in adult 09/02/2018   Bipolar 2 disorder (HCC) 08/12/2018   History of low birth weight 03/11/2018   Vulvar atrophy 03/11/2018   History of migraine headaches 05/01/2015   Anxiety and depression 05/01/2015    Past Surgical History:  Procedure Laterality Date   ABDOMINAL HYSTERECTOMY     FRACTURE SURGERY     ankle surgery with impant    HYSTEROSCOPY WITH D & C N/A 09/22/2019   Procedure: DILATATION AND CURETTAGE /HYSTEROSCOPY;  Surgeon: Ward, Elenora Fender, MD;  Location: [facility];  Service: Gynecology;  Laterality: N/A;   LYSIS OF ADHESION N/A 05/08/2021   Procedure: LYSIS OF ADHESION, EXCISION OF ENDOMETRIOSIS;  Surgeon: Christeen Douglas, MD;  Location: [facility];  Service: Gynecology;  Laterality: N/A;   ROBOTIC ASSISTED DIAGNOSTIC LAPAROSCOPY N/A 02/18/2017   Procedure: ROBOTIC ASSISTED DIAGNOSTIC LAPAROSCOPY,EXCISION AND ABLATION OF ENDOMETRIOSIS,LYSIS OF ADHESIONS;  Surgeon: Olivia Mackie, MD;  Location: [facility];  Service: Gynecology;  Laterality: N/A;   ROBOTIC ASSISTED LAPAROSCOPIC HYSTERECTOMY AND SALPINGECTOMY Bilateral 05/08/2021   Procedure: XI ROBOTIC ASSISTED TOTAL LAPAROSCOPIC HYSTERECTOMY AND SALPINGECTOMY;  Surgeon: Christeen Douglas, MD;  Location: [facility];  Service: Gynecology;  Laterality: Bilateral;   VAGINAL DELIVERY N/A 08/14/2020   Procedure: VAGINAL DELIVERY;  Surgeon: Feliberto Gottron Ihor Austin, MD;  Location: [facility];  Service: Obstetrics;  Laterality: N/A;   WISDOM TOOTH EXTRACTION  2013    OB History     Gravida  2   Para  2   Term  1   Preterm  1   AB  0   Living  3      SAB  0   IAB  0   Ectopic  0   Multiple  1   Live Births  3            Home Medications    Prior to Admission medications   Medication Sig Start Date End Date Taking? Authorizing Provider  acetaminophen (TYLENOL) 500 MG tablet Take 2 tablets (1,000 mg total) by mouth every 6 (six) hours as needed for moderate pain. 07/11/20  Yes McVey, Prudencio Pair,  CNM  albuterol (VENTOLIN HFA) 108 (90 Base) MCG/ACT inhaler Inhale 1-2 puffs into the lungs every 6 (six) hours as needed for wheezing or shortness of breath. 10/09/21  Yes Berniece Salines, FNP  ALPRAZolam Prudy Feeler) 0.25 MG tablet Take 0.25 mg by mouth at bedtime as needed for anxiety.   Yes [provider]  Ascorbic Acid (VITAMIN C) 100 MG tablet Take 100 mg by mouth daily.   Yes [provider]  cholecalciferol (VITAMIN D3) 25 MCG (1000 UNIT) tablet Take 1,000 Units by mouth daily.   Yes [provider]  famotidine (PEPCID) 20 MG tablet Take 1 tablet (20 mg total) by mouth at bedtime. 02/24/22  Yes Berniece Salines, FNP  gabapentin (NEURONTIN) 100 MG capsule Take 400 mg by mouth at bedtime. 02/22/22  Yes [provider]  ibuprofen (ADVIL) 800 MG tablet Take 1 tablet (800 mg total) by mouth every 8 (eight) hours as needed. 11/10/21  Yes Berniece Salines, FNP  lamoTRIgine (LAMICTAL) 200 MG tablet Take 200 mg by mouth at bedtime. 11/22/21  Yes [provider]  lurasidone (LATUDA) 20 MG TABS tablet Take 1 tablet (20 mg total) by mouth daily. 04/15/21  Yes Gabriel Cirri, NP  MAGNESIUM MALATE PO Take 1 tablet by mouth daily at 6 (six) AM.   Yes [provider]  meclizine (ANTIVERT) 25 MG tablet Take 1 tablet (25 mg total) by mouth 3 (three) times daily as needed for dizziness. 12/05/21  Yes Anson Fret, MD  Multiple Vitamin (MULTIVITAMIN ADULT PO) Take 1 tablet by mouth daily.   Yes [provider]  omeprazole (PRILOSEC) 20 MG capsule Take 1 capsule (20 mg total) by mouth daily. 02/24/22  Yes Berniece Salines, FNP  ondansetron (ZOFRAN-ODT) 4 MG disintegrating tablet Take 1-2 tablets (4-8 mg total) by mouth every 8 (eight) hours as needed. For nausea or dizziness 12/05/21  Yes Naomie Dean B, MD  phenol (CHLORASEPTIC) 1.4 % LIQD Use as directed 1 spray in the mouth or throat as needed for throat irritation / pain. 03/16/22  Yes , Acie Fredrickson, FNP   propranolol (INDERAL) 10 MG tablet Take 1 tablet (10 mg total) by mouth 2 (two) times daily. Patient taking differently: Take 20 mg by mouth 2 (two) times daily. 01/07/21  Yes Danelle Berry, PA-C  propranolol (INDERAL) 20 MG tablet Take by mouth. 02/19/22  Yes [provider]  tiZANidine (ZANAFLEX) 4 MG tablet Take 2-4 mg by mouth at bedtime as needed. 02/27/22  Yes [provider]  vitamin B-12 (CYANOCOBALAMIN) 500 MCG tablet Take 500 mcg by mouth daily.   Yes [provider]  VYVANSE 20 MG capsule Take 20 mg by mouth every morning. 02/23/22   [provider]  Family History Family History  Problem Relation Age of Onset   Endometriosis Mother    Bipolar disorder Mother    Diabetes Mother        prediabetes   Breast cancer Paternal Grandmother    Cancer Paternal Grandmother    Depression Paternal Grandmother    Schizophrenia Father    Depression Father    Heart disease Neg Hx    Colon cancer Neg Hx    Ovarian cancer Neg Hx     Social History Social History   Tobacco Use   Smoking status: Former    Years: 1.00    Types: Cigarettes    Quit date: 12/20/2009    Years since quitting: 12.2   Smokeless tobacco: Never   Tobacco comments:    age [age], quit, 1 cig daily  Vaping Use   Vaping Use: Never used  Substance Use Topics   Alcohol use: Not Currently   Drug use: No     Allergies   Amoxicillin, Other, and Latex   Review of Systems Review of Systems Per HPI  Physical Exam Triage Vital Signs ED Triage Vitals [03/16/22 1027]  Enc Vitals Group     BP 118/75     Pulse Rate 68     Resp 18     Temp 98.6 F (37 C)     Temp Source Oral     SpO2 97 %     Weight 209 lb (94.8 kg)     Height 5\' 1"  (1.549 m)     Head Circumference      Peak Flow      Pain Score 4     Pain Loc      Pain Edu?      Excl. in GC?    No data found.  Updated Vital Signs BP 118/75 (BP Location: Left Arm)   Pulse 68   Temp 98.6 F (37 C) (Oral)    Resp 18   Ht 5\' 1"  (1.549 m)   Wt 209 lb (94.8 kg)   LMP 04/08/2021   SpO2 97%   BMI 39.49 kg/m   Visual Acuity Right Eye Distance:   Left Eye Distance:   Bilateral Distance:    Right Eye Near:   Left Eye Near:    Bilateral Near:     Physical Exam Constitutional:      General: She is not in acute distress.    Appearance: Normal appearance. She is not toxic-appearing or diaphoretic.  HENT:     Head: Normocephalic and atraumatic.     Right Ear: Tympanic membrane and ear canal normal.     Left Ear: Tympanic membrane and ear canal normal.     Nose: Nose normal.     Mouth/Throat:     Mouth: Mucous membranes are moist.     Pharynx: No posterior oropharyngeal erythema.  Eyes:     Extraocular Movements: Extraocular movements intact.     Conjunctiva/sclera: Conjunctivae normal.  Cardiovascular:     Rate and Rhythm: Normal rate and regular rhythm.     Pulses: Normal pulses.     Heart sounds: Normal heart sounds.  Pulmonary:     Effort: Pulmonary effort is normal. No respiratory distress.     Breath sounds: Normal breath sounds.  Neurological:     General: No focal deficit present.     Mental Status: She is alert and oriented to person, place, and time. Mental status is at baseline.  Psychiatric:  Mood and Affect: Mood normal.        Behavior: Behavior normal.        Thought Content: Thought content normal.        Judgment: Judgment normal.     UC Treatments / Results  Labs (all labs ordered are listed, but only abnormal results are displayed) Labs Reviewed  CULTURE, GROUP A STREP ([facility])  NOVEL CORONAVIRUS, NAA  POCT RAPID STREP A (OFFICE)    EKG   Radiology No results found.  Procedures Procedures (including critical care time)  Medications Ordered in UC Medications - No data to display  Initial Impression / Assessment and Plan / UC Course  I have reviewed the triage vital signs and the nursing notes.  Pertinent labs & imaging results that were  available during my care of the patient were reviewed by me and considered in my medical decision making (see chart for details).     Suspect viral pharyngitis versus allergies.  Rapid strep is negative.  Throat culture is pending.  No signs of peritonsillar abscess on exam.  COVID test pending.  Discussed supportive care and symptom management with patient.  Discussed return precautions.  Patient verbalized understanding and was agreeable with plan. Final Clinical Impressions(s) / UC Diagnoses   Final diagnoses:  Viral pharyngitis     Discharge Instructions      Rapid strep was negative.  Throat culture and COVID test are pending.  We will call if they are positive.  It appears that you have a viral illness that could be causing your sore throat.  You have been prescribed a throat spray to take as needed for throat irritation.  Please follow-up if symptoms persist or worsen.    ED Prescriptions     Medication Sig Dispense Auth. Provider   phenol (CHLORASEPTIC) 1.4 % LIQD Use as directed 1 spray in the mouth or throat as needed for throat irritation / pain. 118 mL Gustavus Bryant, [place]      PDMP not reviewed this encounter.   Gustavus Bryant, [place] 03/16/22 1120

## 2022-03-16 NOTE — Discharge Instructions (Signed)
Rapid strep was negative.  Throat culture and COVID test are pending.  We will call if they are positive.  It appears that you have a viral illness that could be causing your sore throat.  You have been prescribed a throat spray to take as needed for throat irritation.  Please follow-up if symptoms persist or worsen.

## 2022-03-16 NOTE — ED Triage Notes (Signed)
Patient c/o possible strep throat, sore throat x 1 day, dry spot in throat.  Patient denies any OTC pain meds.

## 2022-03-17 LAB — NOVEL CORONAVIRUS, NAA: SARS-CoV-2, NAA: NOT DETECTED

## 2022-03-19 DIAGNOSIS — Z419 Encounter for procedure for purposes other than remedying health state, unspecified: Secondary | ICD-10-CM | POA: Diagnosis not present

## 2022-03-20 LAB — CULTURE, GROUP A STREP (THRC)

## 2022-03-23 ENCOUNTER — Ambulatory Visit: Payer: Medicaid Other | Admitting: Licensed Clinical Social Worker

## 2022-03-23 NOTE — Progress Notes (Unsigned)
Counselor/Therapist Progress Note  [name], MRN: [id number],    Date: 03/23/2022  Time Spent: ***   Treatment Type: Psychotherapy  Reported Symptoms: {CHL AMB Reported Symptoms:2286800360}  Mental Status Exam:  Appearance:   {PSY:22683}     Behavior:  {PSY:21022743}  Motor:  {PSY:22302}  Speech/Language:   {PSY:22685}  Affect:  {PSY:22687}  Mood:  {PSY:31886}  Thought process:  {PSY:31888}  Thought content:    {PSY:[phone]}  Sensory/Perceptual disturbances:    {[phone]}  Orientation:  {PSY:30297}  Attention:  {PSY:22877}  Concentration:  {[phone]}  Memory:  {PSY:270-368-5263}  Fund of knowledge:   {[phone]}  Insight:    {[phone]}  Judgment:   {[phone]}  Impulse Control:  {[phone]}   Risk Assessment: Danger to Self:  No Self-injurious Behavior: No Danger to Others: No Duty to Warn:no Physical Aggression / Violence:No  Access to Firearms a concern: No  Gang Involvement:No   Subjective: Patient was receptive to feedback and intervention from LCSW and actively and effectively participated throughout the session. Patient is likely to benefit from future treatment because  remains motivated to decrease  And   and reports benefit of regular sessions in addressing these symptoms.    Interventions: Cognitive Behavioral Therapy Established psychological safety. Checked in with patient regarding her week. Reviewed previous session regarding  Therapist assisted, actively listened, taught, shared, role/played, provided    Diagnosis:   ICD-10-CM   1. Generalized anxiety disorder  F41.1     2. Mild bipolar II disorder, most recent episode major depressive ([name])  F31.81       Plan: Patient's goal is: Help with anxiety and working through things. She reports benefit from having someone to listen to her.   Treatment Target: Increase realistic balanced thinking Explore patient's thoughts, beliefs, automatic thoughts,  assumptions Identify unhelpful thinking patterns Process distress and allow for emotional release Questioning and challenging thoughts Cognitive reappraisal   Treatment Target: Increase coping skills Mindfulness practices Acceptance practices  Intentional / Deep breathing STOP technique Grounding exercises Treatment Target: Reducing vulnerability to "emotional mind" Values clarification   Self-care - nutrition, sleep, exercise Increase living within values  Teach distress tolerance techniques - "what helps me"   Future Appointments  Date Time Provider [place]  03/23/2022  3:10 PM Milton Ferguson, LCSW [facility] None  04/01/2022  2:00 PM Reinhartsen, Kevan Ny, PT [facility] [place], [place]

## 2022-03-27 DIAGNOSIS — F3181 Bipolar II disorder: Secondary | ICD-10-CM | POA: Diagnosis not present

## 2022-03-30 ENCOUNTER — Ambulatory Visit: Payer: Medicaid Other | Admitting: Licensed Clinical Social Worker

## 2022-03-30 DIAGNOSIS — F3181 Bipolar II disorder: Secondary | ICD-10-CM

## 2022-03-30 DIAGNOSIS — F411 Generalized anxiety disorder: Secondary | ICD-10-CM

## 2022-03-30 NOTE — Progress Notes (Signed)
Counselor/Therapist Progress Note  [name], MRN: [id number],    Date: 03/30/2022  Time Spent: 45 minutes   Treatment Type: Psychotherapy  Reported Symptoms:  last 7 days low mood, and low motivation; sleep disturbance  Mental Status Exam:  Appearance:   Fairly Groomed     Behavior:  Appropriate, Sharing, and Motivated  Motor:  Normal  Speech/Language:   Clear and Coherent and Normal Rate  Affect:  Appropriate, Congruent, and Full Range  Mood:  normal  Thought process:  normal  Thought content:    WNL  Sensory/Perceptual disturbances:    WNL  Orientation:  oriented to person, place, time/date, and situation  Attention:  Good  Concentration:  Good  Memory:  WNL  Fund of knowledge:   Good  Insight:    Good  Judgment:   Good  Impulse Control:  Good   Risk Assessment: Danger to Self:  No Self-injurious Behavior: No Danger to Others: No Duty to Warn:no Physical Aggression / Violence:No  Access to Firearms a concern: No  Gang Involvement:No   Subjective: Patient was engaged and cooperative throughout the session using time to discuss thoughts and feelings. Patient voices continued motivation for treatment and reports some benefit from the combination of medication management and therapy.   Interventions:  Client Centered  Established psychological safety. Checked in with patient regarding their week, symptoms and psychosocial stressors. LCSW provided supportive space for patient to process their thoughts and emotions about what they have experienced with mood, self-care, and in marriage. Provided support through active listening, validation of feelings, and highlighted patient's strengths.   Diagnosis:   ICD-10-CM   1. Generalized anxiety disorder  F41.1     2. Mild bipolar II disorder, most recent episode major depressive (HCC)  F31.81      Plan: Patient's goal is: Help with anxiety and working through things. She reports benefit from having someone to  listen to her.   Treatment Target: Increase realistic balanced thinking Explore patient's thoughts, beliefs, automatic thoughts, assumptions Identify unhelpful thinking patterns Process distress and allow for emotional release Questioning and challenging thoughts Cognitive reappraisal   Treatment Target: Increase coping skills Mindfulness practices Acceptance practices  Intentional / Deep breathing STOP technique Grounding exercises Treatment Target: Reducing vulnerability to "emotional mind" Values clarification   Self-care - nutrition, sleep, exercise Increase living within values  Teach distress tolerance techniques - "what helps me"   Future Appointments  Date Time Provider Department Center  04/01/2022  2:00 PM Brunetta Genera [facility] [place]  04/13/2022  2:00 PM Kathreen Cosier, LCSW [facility] None    Kathreen Cosier, LCSW

## 2022-04-01 ENCOUNTER — Ambulatory Visit: Payer: Medicaid Other | Attending: Neurology | Admitting: Physical Therapy

## 2022-04-01 ENCOUNTER — Other Ambulatory Visit: Payer: Self-pay

## 2022-04-01 ENCOUNTER — Encounter: Payer: Self-pay | Admitting: Physical Therapy

## 2022-04-01 DIAGNOSIS — M6281 Muscle weakness (generalized): Secondary | ICD-10-CM | POA: Diagnosis not present

## 2022-04-01 DIAGNOSIS — M545 Low back pain, unspecified: Secondary | ICD-10-CM | POA: Diagnosis not present

## 2022-04-01 DIAGNOSIS — M542 Cervicalgia: Secondary | ICD-10-CM | POA: Insufficient documentation

## 2022-04-01 NOTE — Therapy (Signed)
OUTPATIENT PHYSICAL THERAPY THORACOLUMBAR EVALUATION   Patient Name: Janet Rich MRN: [id number] DOB:March 21, 1996, 26 y.o., female Today's Date: 04/01/2022   PT End of Session - 04/01/22 1454     Visit Number 1    Number of Visits --   1-2x/week   Date for PT Re-Evaluation 05/27/22    Authorization Type [employer] (VL 27) - waiting for auth    PT Start Time 0200    PT Stop Time 0242    PT Time Calculation (min) 42 min             Past Medical History:  Diagnosis Date   Anemia    with periods   Ankle fracture    right   Anxiety    Asthma    Back pain    Bipolar 1 disorder (HCC)    Depression    Bipolar    Endometriosis    Fibromyalgia    GERD (gastroesophageal reflux disease)    Headache    MIGRAINES   IBS (irritable bowel syndrome)    Ovarian cyst    PCOS (polycystic ovarian syndrome)    Pneumonia    age 6   PONV (postoperative nausea and vomiting)    nausea after wisdom teeth extraction   Scoliosis    Vaginal Pap smear, abnormal    bx ok   Past Surgical History:  Procedure Laterality Date   ABDOMINAL HYSTERECTOMY     FRACTURE SURGERY     ankle surgery with impant    HYSTEROSCOPY WITH D & C N/A 09/22/2019   Procedure: DILATATION AND CURETTAGE /HYSTEROSCOPY;  Surgeon: Ward, Honor Loh, MD;  Location: [facility];  Service: Gynecology;  Laterality: N/A;   LYSIS OF ADHESION N/A 05/08/2021   Procedure: LYSIS OF ADHESION, EXCISION OF ENDOMETRIOSIS;  Surgeon: Benjaman Kindler, MD;  Location: [facility];  Service: Gynecology;  Laterality: N/A;   ROBOTIC ASSISTED DIAGNOSTIC LAPAROSCOPY N/A 02/18/2017   Procedure: ROBOTIC ASSISTED DIAGNOSTIC LAPAROSCOPY,EXCISION AND ABLATION OF ENDOMETRIOSIS,LYSIS OF ADHESIONS;  Surgeon: Brien Few, MD;  Location: [facility];  Service: Gynecology;  Laterality: N/A;   ROBOTIC ASSISTED LAPAROSCOPIC HYSTERECTOMY AND SALPINGECTOMY Bilateral 05/08/2021   Procedure: XI ROBOTIC ASSISTED TOTAL LAPAROSCOPIC HYSTERECTOMY AND SALPINGECTOMY;   Surgeon: Benjaman Kindler, MD;  Location: [facility];  Service: Gynecology;  Laterality: Bilateral;   VAGINAL DELIVERY N/A 08/14/2020   Procedure: VAGINAL DELIVERY;  Surgeon: Ouida Sills Gwen Her, MD;  Location: [facility];  Service: Obstetrics;  Laterality: N/A;   [place] EXTRACTION  2013   Patient Active Problem List   Diagnosis Date Noted   Fibromyalgia syndrome 03/09/2022   Chronic back pain 03/09/2022   Excessive daytime sleepiness 11/28/2021   Endometriosis determined by laparoscopy 05/08/2021   Myalgia 03/11/2021   Elevated BP without diagnosis of hypertension 01/08/2021   Dichorionic diamniotic twin pregnancy in third trimester 08/14/2020   Preeclampsia 08/14/2020   Twin pregnancy delivered vaginally 08/14/2020   Preterm labor 06/17/2020   Right trigeminal neuralgia 11/08/2019   Endometriosis 03/27/2019   PCOS (polycystic ovarian syndrome) 03/27/2019   Moderate persistent asthma 03/07/2019   Post-nasal drainage 03/07/2019   Irritable bowel syndrome with diarrhea 03/07/2019   Gastroesophageal reflux disease without esophagitis 03/07/2019   Class 2 obesity due to excess calories without serious comorbidity with body mass index (BMI) of 38.0 to 38.9 in adult 09/02/2018   Bipolar 2 disorder ([place]) 08/12/2018   History of low birth weight 03/11/2018   Vulvar atrophy 03/11/2018   History of migraine headaches 05/01/2015   Anxiety  and depression 05/01/2015    PCP: Bo Merino, FNP  REFERRING PROVIDER: Meeler, Sherren Kerns, FNP  THERAPY DIAG:  Low back pain, unspecified back pain laterality, unspecified chronicity, unspecified whether sciatica present - Plan: PT plan of care cert/re-cert  Cervicalgia - Plan: PT plan of care cert/re-cert  Muscle weakness - Plan: PT plan of care cert/re-cert  REFERRING DIAG: Dorsalgia, unspecified [M54.9], Sciatica, unspecified side [M54.30]  Rationale for Evaluation and Treatment Rehabilitation  SUBJECTIVE:  PERTINENT PAST HISTORY:   Bipolar, fibromyalgia, headaches, scoliosis, asthma, hysterectomy, anxiety, depression, PTSD         PRECAUTIONS: None  WEIGHT BEARING RESTRICTIONS No  FALLS:  Has patient fallen in last 6 months? Yes, Number of falls: 1, tripped over a child  MOI/History of condition:  Onset date: ~10 years  Janet Rich is a 26 y.o. female who presents to clinic with chief complaint of pain from cervical, thoracic, and lumbar pain; lumbar pain is most severe  This started after an injury when she caught another student unexpectedly.  She has had chronic back pain on and off this time.  She has gone to a chiropractor but this has not helped.  Denies BB.  She endorses radiating pain to both knees at time.  Pain can be in either leg or both.  She has 3 young children and lifting them causes an increase in pain.  Not PT.  She has been to rheumatologist with no clear underlying disease.  From referring provider:      Red flags:  denies   Pain:  Are you having pain? Yes Pain location: low back pain with radiating pain to bil knees intermittently NPRS scale:  current 4/10  average 7/10  Aggravating factors: bending and lifting, standing (1/2 hour), sitting (1 hour)  NPRS, highest: 9/10 Relieving factors: changing positions, ice, heat, medication, rest  NPRS: best: 2/10 Pain description: intermittent, constant, burning, stabbing, and "nerve" Stage: Chronic Stability: staying the same 24 hour pattern: better in the morning, then increasing throughout the day.   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  Assistive Device: NA  Hand Dominance: R  Patient Goals/Specific Activities: switch over laundry, reduce pain   OBJECTIVE:   DIAGNOSTIC FINDINGS:  None current  GENERAL OBSERVATION/GAIT:  Increased lumbar lordosis, R sided rib elevation in lumbar flexion, increased lumbar lordosis,  SENSATION:  Light touch: Appears intact  MUSCLE LENGTH: Hamstrings: Right no restriction; Left no  restriction ASLR: Right ASLR = PSLR; Left ASLR = PSLR Lampkins test: Right significant restriction; Left significant restriction Ely's test: Right significant restriction; Left significant restriction   Tight piriformis bil    LUMBAR AROM  AROM AROM  04/01/2022  Flexion WNL  Extension WNL, w/ concordant pain  Right lateral flexion WNL, w/ concordant pain  Left lateral flexion WNL, w/ concordant pain  Right rotation limited by 25%, w/ concordant pain  Left rotation limited by 25%, w/ no pain    (Blank rows = not tested)  DIRECTIONAL PREFERENCE:  none  LE MMT:  MMT Right 04/01/2022 Left 04/01/2022  Hip flexion (L2, L3) 4 4  Knee extension (L3) 4 4  Knee flexion 4 4  Hip abduction 3+ 4  Hip extension 3+ 4  Hip external rotation    Hip internal rotation    Hip adduction    Ankle dorsiflexion (L4)    Ankle plantarflexion (S1)    Ankle inversion    Ankle eversion    Great Toe ext (L5)    Grossly     (  Blank rows = not tested, score listed is out of 5 possible points.  N = WNL, D = diminished, C = clear for gross weakness with myotome testing, * = concordant pain with testing)   LE ROM:  ROM Right 04/01/2022 Left 04/01/2022  Hip flexion 90 90  Hip extension limited limited  Hip abduction    Hip adduction    Hip internal rotation limited limited  Hip external rotation    Knee flexion    Knee extension    Ankle dorsiflexion    Ankle plantarflexion    Ankle inversion    Ankle eversion      (Blank rows = not tested, N = WNL, * = concordant pain with testing)  Functional Tests  Eval (04/01/2022)    Sustained supine bridge (dominant leg extended at 120'', if reached): 19'' (norm 170'')     Standard plank from knees: 27'' (norm 70'' from feet)                                                       LUMBAR SPECIAL TESTS:  Straight leg raise: L (-), R (-) Slump: L (-), R (-) SI: fortin's sign, compression, sacral thrust (+)   PALPATION:   TTP bil  PSIS, sacrum, TTP lumbar paraspinals L1-L5   SPINAL SEGMENTAL MOBILITY ASSESSMENT:  Painful, increase lordosis lumbar spine  PATIENT SURVEYS:  Modified Oswestry 29->58% disability    TODAY'S TREATMENT  Creating, reviewing, and completing below HEP  PATIENT EDUCATION:  POC, diagnosis, prognosis, HEP, and outcome measures.  Pt educated via explanation, demonstration, and handout (HEP).  Pt confirms understanding verbally.   HOME EXERCISE PROGRAM: Access Code: [email or web address] URL: [email or web address] Date: 04/01/2022 Prepared by: Shearon Balo  Exercises - Hip Flexor Stretch at [facility] of [facility]  - 2 x daily - 7 x weekly - 2 sets - 45 second hold - Supine Posterior Pelvic Tilt  - 2 x daily - 7 x weekly - 2 sets - 10 reps - 5'' hold - Supine Hip Adduction Isometric with Ball  - 1 x daily - 7 x weekly - 1 sets - 10 reps - 10'' hold - Supine Bridge  - 1 x daily - 7 x weekly - 2 sets - 10 reps - 5 second hold hold  ASTERISK SIGNS   Asterisk Signs Eval (04/01/2022)       piriformis Sig restriction       Hip flexors Sig restriction       bridge 19''       Plank from knees 27''       R hip ext and abd MMT 3+         ASSESSMENT:  CLINICAL IMPRESSION: Janet Rich is a 26 y.o. female who presents to clinic with signs and sxs consistent chronic mechanical low back pain with concurrent SI pain.  Ruling down lumbar radiculopathy with (-) slump and SLR.  Ruling up SI with (+) compression, Forntin's, and sacral thrust.  Ruling up mechanical low back pain with increased lumbar lordosis and tight hip flexors along with poor core strength.  Pt also has cervical and thoracic pain as well - evaluation of these were deferred d/t time.  OBJECTIVE IMPAIRMENTS: Pain, core strength, hip and LE strength  ACTIVITY LIMITATIONS: lifting, walking, standing, child care, housework  PERSONAL FACTORS: See medical history and  pertinent history   REHAB POTENTIAL: Good  CLINICAL DECISION MAKING:  Stable/uncomplicated  EVALUATION COMPLEXITY: Low   GOALS:   SHORT TERM GOALS: Target date: 04/22/2022  Janet Rich will be >75% HEP compliant to improve carryover between sessions and facilitate independent management of condition  Evaluation (04/01/2022): ongoing Goal status: INITIAL   LONG TERM GOALS: Target date: 05/27/2022  Janet Rich will show a >/= 12 pt improvement in their ODI score (MCID is 12 pts) as a proxy for functional improvement   Evaluation/Baseline (04/01/2022): 58%  Goal status: INITIAL   2.  Janet Rich will be able to maintain supine bridge for 60'' (dominant leg extended if 120'' reached) as evidence of improved hip extension and core strength (norm for healthy adult ~170'')   Evaluation/Baseline (04/01/2022): 19'' Goal status: INITIAL   3.  Janet Rich will be able to maintain plank for 35'' from knees (norm for healthy adult is ~70'' from feet)   Evaluation/Baseline (04/01/2022): 27'' from knees Goal status: INITIAL   4.  Janet Rich will be able to complete housework including laundry, not limited by pain  Evaluation/Baseline (04/01/2022): limited Goal status: INITIAL   5.  Janet Rich will self report >/= 50% decrease in pain from evaluation   Evaluation/Baseline (04/01/2022): 9/10 max pain Goal status: INITIAL   PLAN: PT FREQUENCY: 1-2x/week  PT DURATION: 8 weeks (Ending 05/27/2022)  PLANNED INTERVENTIONS: Therapeutic exercises, Aquatic therapy, Therapeutic activity, Neuro Muscular re-education, Gait training, Patient/Family education, Joint mobilization, Dry Needling, Electrical stimulation, Spinal mobilization and/or manipulation, Moist heat, Taping, Vasopneumatic device, Ionotophoresis 4mg /ml Dexamethasone, and Manual therapy  PLAN FOR NEXT SESSION: progressive core and hip strength, hip flexor/piriformis stretching   Shearon Balo PT, DPT 04/01/2022, 3:00 PM

## 2022-04-02 DIAGNOSIS — S93401A Sprain of unspecified ligament of right ankle, initial encounter: Secondary | ICD-10-CM | POA: Diagnosis not present

## 2022-04-02 DIAGNOSIS — S93602A Unspecified sprain of left foot, initial encounter: Secondary | ICD-10-CM | POA: Diagnosis not present

## 2022-04-02 DIAGNOSIS — M25572 Pain in left ankle and joints of left foot: Secondary | ICD-10-CM | POA: Diagnosis not present

## 2022-04-02 DIAGNOSIS — M25571 Pain in right ankle and joints of right foot: Secondary | ICD-10-CM | POA: Diagnosis not present

## 2022-04-02 DIAGNOSIS — S93402A Sprain of unspecified ligament of left ankle, initial encounter: Secondary | ICD-10-CM | POA: Diagnosis not present

## 2022-04-02 DIAGNOSIS — M79672 Pain in left foot: Secondary | ICD-10-CM | POA: Diagnosis not present

## 2022-04-02 DIAGNOSIS — M797 Fibromyalgia: Secondary | ICD-10-CM | POA: Diagnosis not present

## 2022-04-09 DIAGNOSIS — M65871 Other synovitis and tenosynovitis, right ankle and foot: Secondary | ICD-10-CM | POA: Diagnosis not present

## 2022-04-09 DIAGNOSIS — S93412D Sprain of calcaneofibular ligament of left ankle, subsequent encounter: Secondary | ICD-10-CM | POA: Diagnosis not present

## 2022-04-09 DIAGNOSIS — S93492D Sprain of other ligament of left ankle, subsequent encounter: Secondary | ICD-10-CM | POA: Diagnosis not present

## 2022-04-09 DIAGNOSIS — M65872 Other synovitis and tenosynovitis, left ankle and foot: Secondary | ICD-10-CM | POA: Diagnosis not present

## 2022-04-09 NOTE — Therapy (Incomplete)
OUTPATIENT PHYSICAL THERAPY TREATMENT NOTE   Patient Name: Janet Rich MRN: [id number] DOB:1996/02/11, 26 y.o., female Today's Date: 04/09/2022  PCP: Berniece Salines, FNP REFERRING PROVIDER: Meeler, Jodelle Gross, FNP  END OF SESSION:    Past Medical History:  Diagnosis Date   Anemia    with periods   Ankle fracture    right   Anxiety    Asthma    Back pain    Bipolar 1 disorder (HCC)    Depression    Bipolar    Endometriosis    Fibromyalgia    GERD (gastroesophageal reflux disease)    Headache    MIGRAINES   IBS (irritable bowel syndrome)    Ovarian cyst    PCOS (polycystic ovarian syndrome)    Pneumonia    age 33   PONV (postoperative nausea and vomiting)    nausea after wisdom teeth extraction   Scoliosis    Vaginal Pap smear, abnormal    bx ok   Past Surgical History:  Procedure Laterality Date   ABDOMINAL HYSTERECTOMY     FRACTURE SURGERY     ankle surgery with impant    HYSTEROSCOPY WITH D & C N/A 09/22/2019   Procedure: DILATATION AND CURETTAGE /HYSTEROSCOPY;  Surgeon: Ward, Elenora Fender, MD;  Location: [facility];  Service: Gynecology;  Laterality: N/A;   LYSIS OF ADHESION N/A 05/08/2021   Procedure: LYSIS OF ADHESION, EXCISION OF ENDOMETRIOSIS;  Surgeon: Christeen Douglas, MD;  Location: [facility];  Service: Gynecology;  Laterality: N/A;   ROBOTIC ASSISTED DIAGNOSTIC LAPAROSCOPY N/A 02/18/2017   Procedure: ROBOTIC ASSISTED DIAGNOSTIC LAPAROSCOPY,EXCISION AND ABLATION OF ENDOMETRIOSIS,LYSIS OF ADHESIONS;  Surgeon: Olivia Mackie, MD;  Location: [facility];  Service: Gynecology;  Laterality: N/A;   ROBOTIC ASSISTED LAPAROSCOPIC HYSTERECTOMY AND SALPINGECTOMY Bilateral 05/08/2021   Procedure: XI ROBOTIC ASSISTED TOTAL LAPAROSCOPIC HYSTERECTOMY AND SALPINGECTOMY;  Surgeon: Christeen Douglas, MD;  Location: [facility];  Service: Gynecology;  Laterality: Bilateral;   VAGINAL DELIVERY N/A 08/14/2020   Procedure: VAGINAL DELIVERY;  Surgeon: Feliberto Gottron Ihor Austin, MD;   Location: [facility];  Service: Obstetrics;  Laterality: N/A;   WISDOM TOOTH EXTRACTION  2013   Patient Active Problem List   Diagnosis Date Noted   Fibromyalgia syndrome 03/09/2022   Chronic back pain 03/09/2022   Excessive daytime sleepiness 11/28/2021   Endometriosis determined by laparoscopy 05/08/2021   Myalgia 03/11/2021   Elevated BP without diagnosis of hypertension 01/08/2021   Dichorionic diamniotic twin pregnancy in third trimester 08/14/2020   Preeclampsia 08/14/2020   Twin pregnancy delivered vaginally 08/14/2020   Preterm labor 06/17/2020   Right trigeminal neuralgia 11/08/2019   Endometriosis 03/27/2019   PCOS (polycystic ovarian syndrome) 03/27/2019   Moderate persistent asthma 03/07/2019   Post-nasal drainage 03/07/2019   Irritable bowel syndrome with diarrhea 03/07/2019   Gastroesophageal reflux disease without esophagitis 03/07/2019   Class 2 obesity due to excess calories without serious comorbidity with body mass index (BMI) of 38.0 to 38.9 in adult 09/02/2018   Bipolar 2 disorder (HCC) 08/12/2018   History of low birth weight 03/11/2018   Vulvar atrophy 03/11/2018   History of migraine headaches 05/01/2015   Anxiety and depression 05/01/2015    REFERRING DIAG: Dorsalgia, unspecified [M54.9], Sciatica, unspecified side [M54.30]  THERAPY DIAG:  No diagnosis found.  Rationale for Evaluation and Treatment Rehabilitation  PERTINENT HISTORY: Bipolar, fibromyalgia, headaches, scoliosis, asthma, hysterectomy, anxiety, depression, PTSD   PRECAUTIONS: None  SUBJECTIVE: ***  PAIN:  Are you having pain? Yes Pain location: low back pain with  radiating pain to bil knees intermittently NPRS scale:  current ***/10  average 7/10  Aggravating factors: bending and lifting, standing (1/2 hour), sitting (1 hour)           NPRS, highest: 9/10 Relieving factors: changing positions, ice, heat, medication, rest           NPRS: best: 2/10 Pain description: intermittent,  constant, burning, stabbing, and "nerve" Stage: Chronic Stability: staying the same 24 hour pattern: better in the morning, then increasing throughout the day.    OBJECTIVE: (objective measures completed at initial evaluation unless otherwise dated)  DIAGNOSTIC FINDINGS:  None current   GENERAL OBSERVATION/GAIT:           Increased lumbar lordosis, R sided rib elevation in lumbar flexion, increased lumbar lordosis,   SENSATION:          Light touch: Appears intact   MUSCLE LENGTH: Hamstrings: Right no restriction; Left no restriction ASLR: Right ASLR = PSLR; Left ASLR = PSLR [place]hall test: Right significant restriction; Left significant restriction Ely's test: Right significant restriction; Left significant restriction                     Tight piriformis bil       LUMBAR AROM   AROM AROM  04/01/2022  Flexion WNL  Extension WNL, w/ concordant pain  Right lateral flexion WNL, w/ concordant pain  Left lateral flexion WNL, w/ concordant pain  Right rotation limited by 25%, w/ concordant pain  Left rotation limited by 25%, w/ no pain    (Blank rows = not tested)   DIRECTIONAL PREFERENCE:           none   LE MMT:   MMT Right 04/01/2022 Left 04/01/2022  Hip flexion (L2, L3) 4 4  Knee extension (L3) 4 4  Knee flexion 4 4  Hip abduction 3+ 4  Hip extension 3+ 4  Hip external rotation      Hip internal rotation      Hip adduction      Ankle dorsiflexion (L4)      Ankle plantarflexion (S1)      Ankle inversion      Ankle eversion      Great Toe ext (L5)      Grossly        (Blank rows = not tested, score listed is out of 5 possible points.  N = WNL, D = diminished, C = clear for gross weakness with myotome testing, * = concordant pain with testing)     LE ROM:   ROM Right 04/01/2022 Left 04/01/2022  Hip flexion 90 90  Hip extension limited limited  Hip abduction      Hip adduction      Hip internal rotation limited limited  Hip external rotation      Knee  flexion      Knee extension      Ankle dorsiflexion      Ankle plantarflexion      Ankle inversion      Ankle eversion         (Blank rows = not tested, N = WNL, * = concordant pain with testing)   Functional Tests   Eval (04/01/2022)      Sustained supine bridge (dominant leg extended at 120'', if reached): 19'' (norm 170'')        Standard plank from knees: 27'' (norm 70'' from feet)  LUMBAR SPECIAL TESTS:  Straight leg raise: L (-), R (-) Slump: L (-), R (-) SI: fortin's sign, compression, sacral thrust (+)    PALPATION:            TTP bil PSIS, sacrum, TTP lumbar paraspinals L1-L5    SPINAL SEGMENTAL MOBILITY ASSESSMENT:  Painful, increase lordosis lumbar spine   PATIENT SURVEYS:  Modified Oswestry 29->58% disability      TODAY'S TREATMENT  [facility] Adult PT Treatment:                                                DATE: 04/10/2022 Aquatic therapy at [address] - therapeutic pool temp *** degrees Pt enters building ***  Treatment took place in water 3.8 to  4 ft 8 in.feet deep depending upon activity.  Pt entered and exited the pool via stair and handrails ***  Pt pain level *** at initiation of water walking.  Therapeutic Exercise: Walking forward/backwards/side stepping with yellow dumbbells at sides Lunge stepping forwards x2 laps Side stepping lunge x2 laps Runners stretch on bottom step x30" BIL Hamstring stretch on bottom step x30" BIL Figure 4 squat stretch, BIL UE support 2x30" BIL Standing thoracic rotation with hands in neutral for resistance x10 BIL STS from 3rd step from bottom Triangle pose with pool noodle Dancers pose with pool noodle at foot At edge of pool, pt performed LE exercise: Hip abd/add x20 BIL Hip ext/flex with knee straight x 20 BIL Hip Circles CC/CCW 2x10 each BIL Marching hip flexion to knee extension 2x10  BIL Hamstring curl x20 BIL Squats 2x20 Step ups on submerged step 2x10 BIL Step up and overs on submerged step 2x10 BIL Sitting on bench in water: Bicycle kicks x1' Reverse bicycle kicks x1' Flutter kicks x1' Scissor kicks x1' Kickboard push/pull x1' Kickboard push downs x1' Pt requires the buoyancy of water for active assisted exercises with buoyancy supported for strengthening and AROM exercises. Hydrostatic pressure also supports joints by unweighting joint load by at least 50 % in 3-4 feet depth water. 80% in chest to neck deep water. Water will provide assistance with movement using the current and laminar flow while the buoyancy reduces weight bearing. Pt requires the viscosity of the water for resistance with strengthening exercises.    04/01/2022: Creating, reviewing, and completing below HEP   PATIENT EDUCATION:  POC, diagnosis, prognosis, HEP, and outcome measures.  Pt educated via explanation, demonstration, and handout (HEP).  Pt confirms understanding verbally.    HOME EXERCISE PROGRAM: Access Code: [email or web address] URL: [email or web address] Date: 04/01/2022 Prepared by: Shearon Balo   Exercises - Hip Flexor Stretch at [facility] of Bed  - 2 x daily - 7 x weekly - 2 sets - 45 second hold - Supine Posterior Pelvic Tilt  - 2 x daily - 7 x weekly - 2 sets - 10 reps - 5'' hold - Supine Hip Adduction Isometric with Ball  - 1 x daily - 7 x weekly - 1 sets - 10 reps - 10'' hold - Supine Bridge  - 1 x daily - 7 x weekly - 2 sets - 10 reps - 5 second hold hold   ASTERISK SIGNS     Asterisk Signs Eval (04/01/2022)            piriformis Sig restriction  Hip flexors Sig restriction            bridge 19''            Plank from knees 27''            R hip ext and abd MMT 3+                ASSESSMENT:   CLINICAL IMPRESSION: ***  Jailin is a 26 y.o. female who presents to clinic with signs and sxs consistent chronic mechanical low back pain with concurrent  SI pain.  Ruling down lumbar radiculopathy with (-) slump and SLR.  Ruling up SI with (+) compression, Forntin's, and sacral thrust.  Ruling up mechanical low back pain with increased lumbar lordosis and tight hip flexors along with poor core strength.  Pt also has cervical and thoracic pain as well - evaluation of these were deferred d/t time.   OBJECTIVE IMPAIRMENTS: Pain, core strength, hip and LE strength   ACTIVITY LIMITATIONS: lifting, walking, standing, child care, housework   PERSONAL FACTORS: See medical history and pertinent history     REHAB POTENTIAL: Good   CLINICAL DECISION MAKING: Stable/uncomplicated   EVALUATION COMPLEXITY: Low     GOALS:     SHORT TERM GOALS: Target date: 04/22/2022   Suzy will be >75% HEP compliant to improve carryover between sessions and facilitate independent management of condition   Evaluation (04/01/2022): ongoing Goal status: INITIAL     LONG TERM GOALS: Target date: 05/27/2022   Elgene will show a >/= 12 pt improvement in their ODI score (MCID is 12 pts) as a proxy for functional improvement    Evaluation/Baseline (04/01/2022): 58%  Goal status: INITIAL     2.  Cristel will be able to maintain supine bridge for 60'' (dominant leg extended if 120'' reached) as evidence of improved hip extension and core strength (norm for healthy adult ~170'')    Evaluation/Baseline (04/01/2022): 19'' Goal status: INITIAL     3.  Shelia will be able to maintain plank for 60'' from knees (norm for healthy adult is ~70'' from feet)    Evaluation/Baseline (04/01/2022): 27'' from knees Goal status: INITIAL     4.  Jalani will be able to complete housework including laundry, not limited by pain   Evaluation/Baseline (04/01/2022): limited Goal status: INITIAL     5.  Jakiah will self report >/= 50% decrease in pain from evaluation    Evaluation/Baseline (04/01/2022): 9/10 max pain Goal status: INITIAL     PLAN: PT FREQUENCY: 1-2x/week   PT  DURATION: 8 weeks (Ending 05/27/2022)   PLANNED INTERVENTIONS: Therapeutic exercises, Aquatic therapy, Therapeutic activity, Neuro Muscular re-education, Gait training, Patient/Family education, Joint mobilization, Dry Needling, Electrical stimulation, Spinal mobilization and/or manipulation, Moist heat, Taping, Vasopneumatic device, Ionotophoresis 4mg /ml Dexamethasone, and Manual therapy   PLAN FOR NEXT SESSION: progressive core and hip strength, hip flexor/piriformis stretching    , PTA 04/09/2022, 12:53 PM

## 2022-04-10 ENCOUNTER — Ambulatory Visit: Payer: Medicaid Other

## 2022-04-10 DIAGNOSIS — M545 Low back pain, unspecified: Secondary | ICD-10-CM | POA: Diagnosis not present

## 2022-04-10 DIAGNOSIS — M542 Cervicalgia: Secondary | ICD-10-CM

## 2022-04-10 DIAGNOSIS — M6281 Muscle weakness (generalized): Secondary | ICD-10-CM | POA: Diagnosis not present

## 2022-04-13 ENCOUNTER — Ambulatory Visit: Payer: Medicaid Other | Admitting: Licensed Clinical Social Worker

## 2022-04-13 DIAGNOSIS — F411 Generalized anxiety disorder: Secondary | ICD-10-CM

## 2022-04-13 DIAGNOSIS — F3181 Bipolar II disorder: Secondary | ICD-10-CM

## 2022-04-15 ENCOUNTER — Ambulatory Visit: Payer: Medicaid Other

## 2022-04-15 DIAGNOSIS — M6281 Muscle weakness (generalized): Secondary | ICD-10-CM | POA: Diagnosis not present

## 2022-04-15 DIAGNOSIS — M545 Low back pain, unspecified: Secondary | ICD-10-CM

## 2022-04-15 DIAGNOSIS — M542 Cervicalgia: Secondary | ICD-10-CM

## 2022-04-15 NOTE — Therapy (Signed)
OUTPATIENT PHYSICAL THERAPY TREATMENT NOTE   Patient Name: Janet Rich MRN: [id number] DOB:02-Mar-1996, 26 y.o., female Today's Date: 04/15/2022  PCP: Bo Merino, FNP REFERRING PROVIDER: Meeler, Sherren Kerns, FNP  END OF SESSION:   PT End of Session - 04/15/22 [place]     Visit Number 3    Date for PT Re-Evaluation 05/27/22    Authorization Type [employer] (VL 27) - waiting for auth    Authorization Time Period 04/01/2022-06/01/2022    Authorization - Visit Number 3    Authorization - Number of Visits 6    PT Start Time 1830    PT Stop Time 1910    PT Time Calculation (min) 40 min    Activity Tolerance Patient tolerated treatment well    Behavior During Therapy WFL for tasks assessed/performed              Past Medical History:  Diagnosis Date   Anemia    with periods   Ankle fracture    right   Anxiety    Asthma    Back pain    Bipolar 1 disorder (HCC)    Depression    Bipolar    Endometriosis    Fibromyalgia    GERD (gastroesophageal reflux disease)    Headache    MIGRAINES   IBS (irritable bowel syndrome)    Ovarian cyst    PCOS (polycystic ovarian syndrome)    Pneumonia    age 67   PONV (postoperative nausea and vomiting)    nausea after wisdom teeth extraction   Scoliosis    Vaginal Pap smear, abnormal    bx ok   Past Surgical History:  Procedure Laterality Date   ABDOMINAL HYSTERECTOMY     FRACTURE SURGERY     ankle surgery with impant    HYSTEROSCOPY WITH D & C N/A 09/22/2019   Procedure: DILATATION AND CURETTAGE /HYSTEROSCOPY;  Surgeon: Ward, Honor Loh, MD;  Location: [facility];  Service: Gynecology;  Laterality: N/A;   LYSIS OF ADHESION N/A 05/08/2021   Procedure: LYSIS OF ADHESION, EXCISION OF ENDOMETRIOSIS;  Surgeon: Benjaman Kindler, MD;  Location: [facility];  Service: Gynecology;  Laterality: N/A;   ROBOTIC ASSISTED DIAGNOSTIC LAPAROSCOPY N/A 02/18/2017   Procedure: ROBOTIC ASSISTED DIAGNOSTIC LAPAROSCOPY,EXCISION AND ABLATION OF  ENDOMETRIOSIS,LYSIS OF ADHESIONS;  Surgeon: Brien Few, MD;  Location: [facility];  Service: Gynecology;  Laterality: N/A;   ROBOTIC ASSISTED LAPAROSCOPIC HYSTERECTOMY AND SALPINGECTOMY Bilateral 05/08/2021   Procedure: XI ROBOTIC ASSISTED TOTAL LAPAROSCOPIC HYSTERECTOMY AND SALPINGECTOMY;  Surgeon: Benjaman Kindler, MD;  Location: [facility];  Service: Gynecology;  Laterality: Bilateral;   VAGINAL DELIVERY N/A 08/14/2020   Procedure: VAGINAL DELIVERY;  Surgeon: Ouida Sills Gwen Her, MD;  Location: [facility];  Service: Obstetrics;  Laterality: N/A;   [place] EXTRACTION  2013   Patient Active Problem List   Diagnosis Date Noted   Fibromyalgia syndrome 03/09/2022   Chronic back pain 03/09/2022   Excessive daytime sleepiness 11/28/2021   Endometriosis determined by laparoscopy 05/08/2021   Myalgia 03/11/2021   Elevated BP without diagnosis of hypertension 01/08/2021   Dichorionic diamniotic twin pregnancy in third trimester 08/14/2020   Preeclampsia 08/14/2020   Twin pregnancy delivered vaginally 08/14/2020   Preterm labor 06/17/2020   Right trigeminal neuralgia 11/08/2019   Endometriosis 03/27/2019   PCOS (polycystic ovarian syndrome) 03/27/2019   Moderate persistent asthma 03/07/2019   Post-nasal drainage 03/07/2019   Irritable bowel syndrome with diarrhea 03/07/2019   Gastroesophageal reflux disease without esophagitis 03/07/2019   Class  2 obesity due to excess calories without serious comorbidity with body mass index (BMI) of 38.0 to 38.9 in adult 09/02/2018   Bipolar 2 disorder (HCC) 08/12/2018   History of low birth weight 03/11/2018   Vulvar atrophy 03/11/2018   History of migraine headaches 05/01/2015   Anxiety and depression 05/01/2015    REFERRING DIAG: Dorsalgia, unspecified [M54.9], Sciatica, unspecified side [M54.30]  THERAPY DIAG:  Low back pain, unspecified back pain laterality, unspecified chronicity, unspecified whether sciatica present  Cervicalgia  Muscle  weakness  Rationale for Evaluation and Treatment Rehabilitation  PERTINENT HISTORY: Bipolar, fibromyalgia, headaches, scoliosis, asthma, hysterectomy, anxiety, depression, PTSD   PRECAUTIONS: None  SUBJECTIVE: Pt reports that she fell down three stairs two weeks ago, resulting in a Lt inversion ankle injury. She arrives in a boot and is working on getting scheduled for an MRI. She reports no neck pain and continued baseline LBP.  PAIN:  Are you having pain? Yes Pain location: low back pain NPRS scale:  current 7/10  Aggravating factors: bending and lifting, standing (1/2 hour), sitting (1 hour)           NPRS, highest: 9/10 Relieving factors: changing positions, ice, heat, medication, rest           NPRS: best: 2/10 Pain description: intermittent, constant, burning, stabbing, and "nerve" Stage: Chronic Stability: staying the same 24 hour pattern: better in the morning, then increasing throughout the day.    OBJECTIVE: (objective measures completed at initial evaluation unless otherwise dated)  DIAGNOSTIC FINDINGS:  None current   GENERAL OBSERVATION/GAIT:           Increased lumbar lordosis, R sided rib elevation in lumbar flexion, increased lumbar lordosis,   SENSATION:          Light touch: Appears intact   MUSCLE LENGTH: Hamstrings: Right no restriction; Left no restriction ASLR: Right ASLR = PSLR; Left ASLR = PSLR Mcsorley test: Right significant restriction; Left significant restriction Ely's test: Right significant restriction; Left significant restriction                     Tight piriformis bil       LUMBAR AROM   AROM AROM  04/01/2022  Flexion WNL  Extension WNL, w/ concordant pain  Right lateral flexion WNL, w/ concordant pain  Left lateral flexion WNL, w/ concordant pain  Right rotation limited by 25%, w/ concordant pain  Left rotation limited by 25%, w/ no pain    (Blank rows = not tested)   DIRECTIONAL PREFERENCE:           none   LE MMT:   MMT  Right 04/01/2022 Left 04/01/2022  Hip flexion (L2, L3) 4 4  Knee extension (L3) 4 4  Knee flexion 4 4  Hip abduction 3+ 4  Hip extension 3+ 4  Hip external rotation      Hip internal rotation      Hip adduction      Ankle dorsiflexion (L4)      Ankle plantarflexion (S1)      Ankle inversion      Ankle eversion      Great Toe ext (L5)      Grossly        (Blank rows = not tested, score listed is out of 5 possible points.  N = WNL, D = diminished, C = clear for gross weakness with myotome testing, * = concordant pain with testing)     LE ROM:  ROM Right 04/01/2022 Left 04/01/2022  Hip flexion 90 90  Hip extension limited limited  Hip abduction      Hip adduction      Hip internal rotation limited limited  Hip external rotation      Knee flexion      Knee extension      Ankle dorsiflexion      Ankle plantarflexion      Ankle inversion      Ankle eversion         (Blank rows = not tested, N = WNL, * = concordant pain with testing)   Functional Tests   Eval (04/01/2022)      Sustained supine bridge (dominant leg extended at 120'', if reached): 19'' (norm 170'')        Standard plank from knees: 27'' (norm 70'' from feet)                                                                                               LUMBAR SPECIAL TESTS:  Straight leg raise: L (-), R (-) Slump: L (-), R (-) SI: fortin's sign, compression, sacral thrust (+)    PALPATION:            TTP bil PSIS, sacrum, TTP lumbar paraspinals L1-L5    SPINAL SEGMENTAL MOBILITY ASSESSMENT:  Painful, increase lordosis lumbar spine   PATIENT SURVEYS:  Modified Oswestry 29->58% disability      TODAY'S TREATMENT   [facility] Adult PT Treatment:                                                DATE: 04/15/2022 Therapeutic Exercise: Sidelying open book x10 BIL Supine 90/90 abdominal isometric 3x30sec Tenaglia stretch x78min BIL Side knee plank with clams 2x10 BIL Bridge isometric with marching  3x20 Prone swimmers with 2.5# ankle weights 3x20 Knee plank x3 to failure Manual Therapy: N/A Neuromuscular re-ed: N/A Therapeutic Activity: N/A Modalities: N/A Self Care: N/A   [facility] Adult PT Treatment:                                                DATE: 04/10/2022 Aquatic therapy at [address] - therapeutic pool temp 92 degrees Pt enters building ambulating in CAM boot but independently.  Treatment took place in water 3.8 to  4 ft 8 in.feet deep depending upon activity.  Pt entered and exited the pool via stair and handrails independently.   Pt pain level 6/10 at initiation of water walking.  Therapeutic Exercise: Walking forward/backwards/side stepping x1 lap each Runners stretch on bottom step x30" BIL Hamstring stretch on bottom step x30" BIL Figure 4 squat stretch, BIL UE support 2x30" BIL Triangle pose with noodle x1' BIL Hip flexor stretch (dancers pose) with noodle x1' BIL At edge of pool, pt performed LE exercise: Hip abd/add x20 BIL  Hip ext/flex with knee straight x 20 BIL Marching hip flexion to knee extension x10 BIL Squats 2x20 Sitting on bench in water: Bicycle kicks x1' Flutter kicks x1' Kickboard push/pull x1' Kickboard push downs x1' Pt requires the buoyancy of water for active assisted exercises with buoyancy supported for strengthening and AROM exercises. Hydrostatic pressure also supports joints by unweighting joint load by at least 50 % in 3-4 feet depth water. 80% in chest to neck deep water. Water will provide assistance with movement using the current and laminar flow while the buoyancy reduces weight bearing. Pt requires the viscosity of the water for resistance with strengthening exercises.     PATIENT EDUCATION:  POC, diagnosis, prognosis, HEP, and outcome measures.  Pt educated via explanation, demonstration, and handout (HEP).  Pt confirms understanding verbally.    HOME EXERCISE PROGRAM: Access Code: [email or web address] URL:  [email or web address] Date: 04/01/2022 Prepared by: Alphonzo Severance   Exercises - Hip Flexor Stretch at [facility] of Bed  - 2 x daily - 7 x weekly - 2 sets - 45 second hold - Supine Posterior Pelvic Tilt  - 2 x daily - 7 x weekly - 2 sets - 10 reps - 5'' hold - Supine Hip Adduction Isometric with Ball  - 1 x daily - 7 x weekly - 1 sets - 10 reps - 10'' hold - Supine Bridge  - 1 x daily - 7 x weekly - 2 sets - 10 reps - 5 second hold hold  Added 04/15/2022: - Marching Bridge  - 1 x daily - 7 x weekly - 3 sets - 20 reps - Prone Swimmer  - 1 x daily - 7 x weekly - 3 sets - 20 reps - Plank on Knees  - 1 x daily - 7 x weekly - 3 sets - to failure hold   ASTERISK SIGNS     Asterisk Signs Eval (04/01/2022)            piriformis Sig restriction            Hip flexors Sig restriction            bridge 19''            Plank from knees 27''            R hip ext and abd MMT 3+                ASSESSMENT:   CLINICAL IMPRESSION: Pt responded well to all interventions today, demonstrating good form and no increase in pain with progressed exercises. She will continue to benefit from skilled PT to address her primary impairments and return to her prior level of function with less limitation.   OBJECTIVE IMPAIRMENTS: Pain, core strength, hip and LE strength   ACTIVITY LIMITATIONS: lifting, walking, standing, child care, housework   PERSONAL FACTORS: See medical history and pertinent history       GOALS:     SHORT TERM GOALS: Target date: 04/22/2022   Jaslene will be >75% HEP compliant to improve carryover between sessions and facilitate independent management of condition   Evaluation (04/01/2022): ongoing Goal status: INITIAL     LONG TERM GOALS: Target date: 05/27/2022   Ashiya will show a >/= 12 pt improvement in their ODI score (MCID is 12 pts) as a proxy for functional improvement    Evaluation/Baseline (04/01/2022): 58%  Goal status: INITIAL     2.  Taleigh will be able  to maintain supine bridge for 60'' (dominant leg  extended if 120'' reached) as evidence of improved hip extension and core strength (norm for healthy adult ~170'')    Evaluation/Baseline (04/01/2022): 19'' Goal status: INITIAL     3.  Zannah will be able to maintain plank for 60'' from knees (norm for healthy adult is ~70'' from feet)    Evaluation/Baseline (04/01/2022): 27'' from knees Goal status: INITIAL     4.  Analisia will be able to complete housework including laundry, not limited by pain   Evaluation/Baseline (04/01/2022): limited Goal status: INITIAL     5.  Faline will self report >/= 50% decrease in pain from evaluation    Evaluation/Baseline (04/01/2022): 9/10 max pain Goal status: INITIAL     PLAN: PT FREQUENCY: 1-2x/week   PT DURATION: 8 weeks (Ending 05/27/2022)   PLANNED INTERVENTIONS: Therapeutic exercises, Aquatic therapy, Therapeutic activity, Neuro Muscular re-education, Gait training, Patient/Family education, Joint mobilization, Dry Needling, Electrical stimulation, Spinal mobilization and/or manipulation, Moist heat, Taping, Vasopneumatic device, Ionotophoresis 4mg /ml Dexamethasone, and Manual therapy   PLAN FOR NEXT SESSION: progressive core and hip strength, hip flexor/piriformis stretching    , PT, DPT 04/15/22 7:11 PM

## 2022-04-18 ENCOUNTER — Ambulatory Visit: Payer: Medicaid Other | Attending: Neurology

## 2022-04-18 DIAGNOSIS — M545 Low back pain, unspecified: Secondary | ICD-10-CM | POA: Diagnosis not present

## 2022-04-18 DIAGNOSIS — M6281 Muscle weakness (generalized): Secondary | ICD-10-CM

## 2022-04-18 DIAGNOSIS — M542 Cervicalgia: Secondary | ICD-10-CM

## 2022-04-18 DIAGNOSIS — Z419 Encounter for procedure for purposes other than remedying health state, unspecified: Secondary | ICD-10-CM | POA: Diagnosis not present

## 2022-04-18 NOTE — Therapy (Signed)
OUTPATIENT PHYSICAL THERAPY TREATMENT NOTE   Patient Name: Janet Rich MRN: [id number] DOB:[birth year], 26 y.o., female Today's Date: 04/18/2022  PCP: Berniece Salines, FNP REFERRING PROVIDER: Meeler, Jodelle Gross, FNP  END OF SESSION:   PT End of Session - 04/18/22 1120     Visit Number 4    Date for PT Re-Evaluation 05/27/22    Authorization Type [employer] (VL 27) - waiting for auth    Authorization Time Period 04/01/2022-06/01/2022    Authorization - Visit Number 4    Authorization - Number of Visits 6    PT Stop Time 1200               Past Medical History:  Diagnosis Date   Anemia    with periods   Ankle fracture    right   Anxiety    Asthma    Back pain    Bipolar 1 disorder (HCC)    Depression    Bipolar    Endometriosis    Fibromyalgia    GERD (gastroesophageal reflux disease)    Headache    MIGRAINES   IBS (irritable bowel syndrome)    Ovarian cyst    PCOS (polycystic ovarian syndrome)    Pneumonia    age 47   PONV (postoperative nausea and vomiting)    nausea after wisdom teeth extraction   Scoliosis    Vaginal Pap smear, abnormal    bx ok   Past Surgical History:  Procedure Laterality Date   ABDOMINAL HYSTERECTOMY     FRACTURE SURGERY     ankle surgery with impant    HYSTEROSCOPY WITH D & C N/A 09/22/2019   Procedure: DILATATION AND CURETTAGE /HYSTEROSCOPY;  Surgeon: Ward, Elenora Fender, MD;  Location: [facility];  Service: Gynecology;  Laterality: N/A;   LYSIS OF ADHESION N/A 05/08/2021   Procedure: LYSIS OF ADHESION, EXCISION OF ENDOMETRIOSIS;  Surgeon: Christeen Douglas, MD;  Location: [facility];  Service: Gynecology;  Laterality: N/A;   ROBOTIC ASSISTED DIAGNOSTIC LAPAROSCOPY N/A 02/18/2017   Procedure: ROBOTIC ASSISTED DIAGNOSTIC LAPAROSCOPY,EXCISION AND ABLATION OF ENDOMETRIOSIS,LYSIS OF ADHESIONS;  Surgeon: Olivia Mackie, MD;  Location: [facility];  Service: Gynecology;  Laterality: N/A;   ROBOTIC ASSISTED LAPAROSCOPIC HYSTERECTOMY AND  SALPINGECTOMY Bilateral 05/08/2021   Procedure: XI ROBOTIC ASSISTED TOTAL LAPAROSCOPIC HYSTERECTOMY AND SALPINGECTOMY;  Surgeon: Christeen Douglas, MD;  Location: [facility];  Service: Gynecology;  Laterality: Bilateral;   VAGINAL DELIVERY N/A 08/14/2020   Procedure: VAGINAL DELIVERY;  Surgeon: Feliberto Gottron Ihor Austin, MD;  Location: [facility];  Service: Obstetrics;  Laterality: N/A;   WISDOM TOOTH EXTRACTION  2013   Patient Active Problem List   Diagnosis Date Noted   Fibromyalgia syndrome 03/09/2022   Chronic back pain 03/09/2022   Excessive daytime sleepiness 11/28/2021   Endometriosis determined by laparoscopy 05/08/2021   Myalgia 03/11/2021   Elevated BP without diagnosis of hypertension 01/08/2021   Dichorionic diamniotic twin pregnancy in third trimester 08/14/2020   Preeclampsia 08/14/2020   Twin pregnancy delivered vaginally 08/14/2020   Preterm labor 06/17/2020   Right trigeminal neuralgia 11/08/2019   Endometriosis 03/27/2019   PCOS (polycystic ovarian syndrome) 03/27/2019   Moderate persistent asthma 03/07/2019   Post-nasal drainage 03/07/2019   Irritable bowel syndrome with diarrhea 03/07/2019   Gastroesophageal reflux disease without esophagitis 03/07/2019   Class 2 obesity due to excess calories without serious comorbidity with body mass index (BMI) of 38.0 to 38.9 in adult 09/02/2018   Bipolar 2 disorder (HCC) 08/12/2018   History of low birth  weight 03/11/2018   Vulvar atrophy 03/11/2018   History of migraine headaches 05/01/2015   Anxiety and depression 05/01/2015    REFERRING DIAG: Dorsalgia, unspecified [M54.9], Sciatica, unspecified side [M54.30]  THERAPY DIAG:  Low back pain, unspecified back pain laterality, unspecified chronicity, unspecified whether sciatica present  Cervicalgia  Muscle weakness  Rationale for Evaluation and Treatment Rehabilitation  PERTINENT HISTORY: Bipolar, fibromyalgia, headaches, scoliosis, asthma, hysterectomy, anxiety,  depression, PTSD   PRECAUTIONS: None  SUBJECTIVE: Patient reports that she was very sore and had high pain after her last session.   PAIN:  Are you having pain? Yes Pain location: low back pain NPRS scale:  current 4/10  Aggravating factors: bending and lifting, standing (1/2 hour), sitting (1 hour)           NPRS, highest: 9/10 Relieving factors: changing positions, ice, heat, medication, rest           NPRS: best: 2/10 Pain description: intermittent, constant, burning, stabbing, and "nerve" Stage: Chronic Stability: staying the same 24 hour pattern: better in the morning, then increasing throughout the day.    OBJECTIVE: (objective measures completed at initial evaluation unless otherwise dated)  DIAGNOSTIC FINDINGS:  None current   GENERAL OBSERVATION/GAIT:           Increased lumbar lordosis, R sided rib elevation in lumbar flexion, increased lumbar lordosis,   SENSATION:          Light touch: Appears intact   MUSCLE LENGTH: Hamstrings: Right no restriction; Left no restriction ASLR: Right ASLR = PSLR; Left ASLR = PSLR Whitfill test: Right significant restriction; Left significant restriction Ely's test: Right significant restriction; Left significant restriction                     Tight piriformis bil       LUMBAR AROM   AROM AROM  04/01/2022  Flexion WNL  Extension WNL, w/ concordant pain  Right lateral flexion WNL, w/ concordant pain  Left lateral flexion WNL, w/ concordant pain  Right rotation limited by 25%, w/ concordant pain  Left rotation limited by 25%, w/ no pain    (Blank rows = not tested)   DIRECTIONAL PREFERENCE:           none   LE MMT:   MMT Right 04/01/2022 Left 04/01/2022  Hip flexion (L2, L3) 4 4  Knee extension (L3) 4 4  Knee flexion 4 4  Hip abduction 3+ 4  Hip extension 3+ 4  Hip external rotation      Hip internal rotation      Hip adduction      Ankle dorsiflexion (L4)      Ankle plantarflexion (S1)      Ankle inversion       Ankle eversion      Great Toe ext (L5)      Grossly        (Blank rows = not tested, score listed is out of 5 possible points.  N = WNL, D = diminished, C = clear for gross weakness with myotome testing, * = concordant pain with testing)     LE ROM:   ROM Right 04/01/2022 Left 04/01/2022  Hip flexion 90 90  Hip extension limited limited  Hip abduction      Hip adduction      Hip internal rotation limited limited  Hip external rotation      Knee flexion      Knee extension      Ankle  dorsiflexion      Ankle plantarflexion      Ankle inversion      Ankle eversion         (Blank rows = not tested, N = WNL, * = concordant pain with testing)   Functional Tests   Eval (04/01/2022)      Sustained supine bridge (dominant leg extended at 120'', if reached): 19'' (norm 170'')        Standard plank from knees: 27'' (norm 70'' from feet)                                                                                               LUMBAR SPECIAL TESTS:  Straight leg raise: L (-), R (-) Slump: L (-), R (-) SI: fortin's sign, compression, sacral thrust (+)    PALPATION:            TTP bil PSIS, sacrum, TTP lumbar paraspinals L1-L5    SPINAL SEGMENTAL MOBILITY ASSESSMENT:  Painful, increase lordosis lumbar spine   PATIENT SURVEYS:  Modified Oswestry 29->58% disability      TODAY'S TREATMENT  [facility] Adult PT Treatment:                                                DATE: 04/18/2022 Therapeutic Exercise: Maisie Fus stretch x42min BIL Bridge isometric with marching 3x20 Seated figure 4 piriformis stretch 2x30" BIL Self Care: Tennis ball self massage Theracane self massage  Manual Therapy: STM to Rt side lumbar paraspinals    [facility] Adult PT Treatment:                                                DATE: 04/15/2022 Therapeutic Exercise: Sidelying open book x10 BIL Supine 90/90 abdominal isometric 3x30sec Gotts stretch x4min BIL Side knee plank with clams 2x10  BIL Bridge isometric with marching 3x20 Prone swimmers with 2.5# ankle weights 3x20 Knee plank x3 to failure Manual Therapy: N/A Neuromuscular re-ed: N/A Therapeutic Activity: N/A Modalities: N/A Self Care: N/A     PATIENT EDUCATION:  POC, diagnosis, prognosis, HEP, and outcome measures.  Pt educated via explanation, demonstration, and handout (HEP).  Pt confirms understanding verbally.    HOME EXERCISE PROGRAM: Access Code: [email or web address] URL: [email or web address] Date: 04/01/2022 Prepared by: Alphonzo Severance   Exercises - Hip Flexor Stretch at [facility] of Bed  - 2 x daily - 7 x weekly - 2 sets - 45 second hold - Supine Posterior Pelvic Tilt  - 2 x daily - 7 x weekly - 2 sets - 10 reps - 5'' hold - Supine Hip Adduction Isometric with Ball  - 1 x daily - 7 x weekly - 1 sets - 10 reps - 10'' hold - Supine Bridge  - 1 x daily - 7 x weekly - 2 sets - 10 reps - 5 second hold hold  Added 04/15/2022: - Marching Bridge  - 1 x daily - 7 x weekly - 3 sets - 20 reps - Prone Swimmer  - 1 x daily - 7 x weekly - 3 sets - 20 reps - Plank on Knees  - 1 x daily - 7 x weekly - 3 sets - to failure hold   ASTERISK SIGNS     Asterisk Signs Eval (04/01/2022)           piriformis Sig restriction            Hip flexors Sig restriction            bridge 19''            Plank from knees 27''            R hip ext and abd MMT 3+                ASSESSMENT:   CLINICAL IMPRESSION: Patient arrived 13 minutes late to appointment, delaying treatment time. Patient was flustered from running late and her back pain sharply increased with bridges and marching so session today focused on self care and education on manual techniques that can be done at home to reduce tissue restriction and tension. Patient continues to benefit from skilled PT services and should be progressed as able to improve functional independence.    OBJECTIVE IMPAIRMENTS: Pain, core strength, hip and LE strength   ACTIVITY  LIMITATIONS: lifting, walking, standing, child care, housework   PERSONAL FACTORS: See medical history and pertinent history       GOALS:     SHORT TERM GOALS: Target date: 04/22/2022   Lakeia will be >75% HEP compliant to improve carryover between sessions and facilitate independent management of condition   Evaluation (04/01/2022): ongoing Goal status: INITIAL     LONG TERM GOALS: Target date: 05/27/2022   Sherree will show a >/= 12 pt improvement in their ODI score (MCID is 12 pts) as a proxy for functional improvement    Evaluation/Baseline (04/01/2022): 58%  Goal status: INITIAL     2.  Neco will be able to maintain supine bridge for 60'' (dominant leg extended if 120'' reached) as evidence of improved hip extension and core strength (norm for healthy adult ~170'')    Evaluation/Baseline (04/01/2022): 19'' Goal status: INITIAL     3.  Tannia will be able to maintain plank for 83'' from knees (norm for healthy adult is ~70'' from feet)    Evaluation/Baseline (04/01/2022): 27'' from knees Goal status: INITIAL     4.  Camree will be able to complete housework including laundry, not limited by pain   Evaluation/Baseline (04/01/2022): limited Goal status: INITIAL     5.  Macel will self report >/= 50% decrease in pain from evaluation    Evaluation/Baseline (04/01/2022): 9/10 max pain Goal status: INITIAL     PLAN: PT FREQUENCY: 1-2x/week   PT DURATION: 8 weeks (Ending 05/27/2022)   PLANNED INTERVENTIONS: Therapeutic exercises, Aquatic therapy, Therapeutic activity, Neuro Muscular re-education, Gait training, Patient/Family education, Joint mobilization, Dry Needling, Electrical stimulation, Spinal mobilization and/or manipulation, Moist heat, Taping, Vasopneumatic device, Ionotophoresis 4mg /ml Dexamethasone, and Manual therapy   PLAN FOR NEXT SESSION: progressive core and hip strength, hip flexor/piriformis stretching    Evelene Croon, PTA 04/18/22 11:21 AM

## 2022-04-20 ENCOUNTER — Ambulatory Visit: Payer: Medicaid Other | Admitting: Licensed Clinical Social Worker

## 2022-04-20 DIAGNOSIS — F411 Generalized anxiety disorder: Secondary | ICD-10-CM

## 2022-04-20 DIAGNOSIS — F3181 Bipolar II disorder: Secondary | ICD-10-CM

## 2022-04-20 NOTE — Progress Notes (Signed)
Counselor/Therapist Progress Note  [name], MRN: [id number],    Date: 04/20/2022  Time Spent:  34 minutes  Treatment Type: Psychotherapy  Reported Symptoms: Physical aches and pain and overall stable mood "okay"   Mental Status Exam:  Appearance:   Casual     Behavior:  Appropriate and Sharing  Motor:  Normal  Speech/Language:   Clear and Coherent and Normal Rate  Affect:  Appropriate, Congruent, and Full Range  Mood:  normal  Thought process:  normal  Thought content:    WNL  Sensory/Perceptual disturbances:    WNL  Orientation:  oriented to person, place, and time/date  Attention:  Good  Concentration:  Good  Memory:  WNL  Fund of knowledge:   Good  Insight:    Good  Judgment:   Good  Impulse Control:  Good   Risk Assessment: Danger to Self:  No Self-injurious Behavior: No Danger to Others: No Duty to Warn:no Physical Aggression / Violence:No  Access to Firearms a concern: No  Gang Involvement:No   Subjective: Patient was engaged and cooperative throughout the session using time to discuss thoughts and feelings. Patient voices continued motivation for treatment and understanding of the need for her care to be transitioned. Patient reports that she has someone that is going to help her to find therapy. Patient is likely to benefit from future treatment because she remains motivated to decrease symptoms and improve functioning and reports benefit from regular sessions in combination of medication management.   Interventions: Cognitive Behavioral Therapy Established psychological safety. Checked in with patient regarding mood and psychosocial stressors, overall "okay" mood, challenges in relationship with mom and other general stressors. Provided supportive space encouraging emotional release and processing of thoughts. LCSW highlighted patient's thoughts vs feelings. Informed patient that [facility] will be transitioning to billing and the need for LCSW to close  out services. Provided support through active listening, validation of feelings, and highlighted patient's strengths.   Diagnosis:   ICD-10-CM   1. Generalized anxiety disorder  F41.1     2. Bipolar 2 disorder (HCC)  F31.81       Plan:  Patient's goal is: Help with anxiety and working through things. She reports benefit from having someone to listen to her.   Treatment Target: Increase realistic balanced thinking Explore patient's thoughts, beliefs, automatic thoughts, assumptions Identify unhelpful thinking patterns Process distress and allow for emotional release Questioning and challenging thoughts Cognitive reappraisal   Treatment Target: Increase coping skills Mindfulness practices Acceptance practices  Intentional / Deep breathing STOP technique Grounding exercises Treatment Target: Reducing vulnerability to "emotional mind" Values clarification   Self-care - nutrition, sleep, exercise Increase living within values  Teach distress tolerance techniques - "what helps me"   Future Appointments  Date Time Provider Department Center  04/22/2022  6:30 PM Harland German, PTA [facility]  04/25/2022 10:30 AM Zadie Rhine, PT [facility]  04/29/2022  6:30 PM Fredderick Phenix, PT [facility]  05/01/2022  1:15 PM Harland German, PTA [facility]  05/04/2022 11:30 AM Kathreen Cosier, LCSW [facility] None  05/06/2022  6:30 PM Fredderick Phenix, PT [facility]  05/08/2022  1:15 PM Harland German, PTA [facility]  05/13/2022  6:30 PM Brunetta Genera [facility]  05/15/2022  1:15 PM Harland German, PTA [facility]    Kathreen Cosier, [place]

## 2022-04-22 ENCOUNTER — Ambulatory Visit: Payer: Medicaid Other

## 2022-04-22 DIAGNOSIS — M542 Cervicalgia: Secondary | ICD-10-CM | POA: Diagnosis not present

## 2022-04-22 DIAGNOSIS — M545 Low back pain, unspecified: Secondary | ICD-10-CM

## 2022-04-22 DIAGNOSIS — M6281 Muscle weakness (generalized): Secondary | ICD-10-CM

## 2022-04-22 NOTE — Therapy (Signed)
OUTPATIENT PHYSICAL THERAPY TREATMENT NOTE   Patient Name: Janet Rich MRN: [id number] DOB:12/24/1995, 26 y.o., female Today's Date: 04/22/2022  PCP: Berniece Salines, FNP REFERRING PROVIDER: Meeler, Jodelle Gross, FNP  END OF SESSION:   PT End of Session - 04/22/22 1829     Visit Number 5    Date for PT Re-Evaluation 05/27/22    Authorization Type [employer] (VL 27) - waiting for auth    Authorization Time Period 04/01/2022-06/01/2022    Authorization - Visit Number 5    Authorization - Number of Visits 6    PT Start Time 1830    PT Stop Time 1910    PT Time Calculation (min) 40 min    Activity Tolerance Patient tolerated treatment well    Behavior During Therapy WFL for tasks assessed/performed                Past Medical History:  Diagnosis Date   Anemia    with periods   Ankle fracture    right   Anxiety    Asthma    Back pain    Bipolar 1 disorder (HCC)    Depression    Bipolar    Endometriosis    Fibromyalgia    GERD (gastroesophageal reflux disease)    Headache    MIGRAINES   IBS (irritable bowel syndrome)    Ovarian cyst    PCOS (polycystic ovarian syndrome)    Pneumonia    age 22   PONV (postoperative nausea and vomiting)    nausea after wisdom teeth extraction   Scoliosis    Vaginal Pap smear, abnormal    bx ok   Past Surgical History:  Procedure Laterality Date   ABDOMINAL HYSTERECTOMY     FRACTURE SURGERY     ankle surgery with impant    HYSTEROSCOPY WITH D & C N/A 09/22/2019   Procedure: DILATATION AND CURETTAGE /HYSTEROSCOPY;  Surgeon: Ward, Elenora Fender, MD;  Location: [facility];  Service: Gynecology;  Laterality: N/A;   LYSIS OF ADHESION N/A 05/08/2021   Procedure: LYSIS OF ADHESION, EXCISION OF ENDOMETRIOSIS;  Surgeon: Christeen Douglas, MD;  Location: [facility];  Service: Gynecology;  Laterality: N/A;   ROBOTIC ASSISTED DIAGNOSTIC LAPAROSCOPY N/A 02/18/2017   Procedure: ROBOTIC ASSISTED DIAGNOSTIC LAPAROSCOPY,EXCISION AND ABLATION OF  ENDOMETRIOSIS,LYSIS OF ADHESIONS;  Surgeon: Olivia Mackie, MD;  Location: [facility];  Service: Gynecology;  Laterality: N/A;   ROBOTIC ASSISTED LAPAROSCOPIC HYSTERECTOMY AND SALPINGECTOMY Bilateral 05/08/2021   Procedure: XI ROBOTIC ASSISTED TOTAL LAPAROSCOPIC HYSTERECTOMY AND SALPINGECTOMY;  Surgeon: Christeen Douglas, MD;  Location: [facility];  Service: Gynecology;  Laterality: Bilateral;   VAGINAL DELIVERY N/A 08/14/2020   Procedure: VAGINAL DELIVERY;  Surgeon: Feliberto Gottron Ihor Austin, MD;  Location: [facility];  Service: Obstetrics;  Laterality: N/A;   WISDOM TOOTH EXTRACTION  2013   Patient Active Problem List   Diagnosis Date Noted   Fibromyalgia syndrome 03/09/2022   Chronic back pain 03/09/2022   Excessive daytime sleepiness 11/28/2021   Endometriosis determined by laparoscopy 05/08/2021   Myalgia 03/11/2021   Elevated BP without diagnosis of hypertension 01/08/2021   Dichorionic diamniotic twin pregnancy in third trimester 08/14/2020   Preeclampsia 08/14/2020   Twin pregnancy delivered vaginally 08/14/2020   Preterm labor 06/17/2020   Right trigeminal neuralgia 11/08/2019   Endometriosis 03/27/2019   PCOS (polycystic ovarian syndrome) 03/27/2019   Moderate persistent asthma 03/07/2019   Post-nasal drainage 03/07/2019   Irritable bowel syndrome with diarrhea 03/07/2019   Gastroesophageal reflux disease without esophagitis 03/07/2019  Class 2 obesity due to excess calories without serious comorbidity with body mass index (BMI) of 38.0 to 38.9 in adult 09/02/2018   Bipolar 2 disorder (HCC) 08/12/2018   History of low birth weight 03/11/2018   Vulvar atrophy 03/11/2018   History of migraine headaches 05/01/2015   Anxiety and depression 05/01/2015    REFERRING DIAG: Dorsalgia, unspecified [M54.9], Sciatica, unspecified side [M54.30]  THERAPY DIAG:  Low back pain, unspecified back pain laterality, unspecified chronicity, unspecified whether sciatica present  Cervicalgia  Muscle  weakness  Rationale for Evaluation and Treatment Rehabilitation  PERTINENT HISTORY: Bipolar, fibromyalgia, headaches, scoliosis, asthma, hysterectomy, anxiety, depression, PTSD   PRECAUTIONS: None  SUBJECTIVE: Patient reports high pain and radiating down both legs.   PAIN:  Are you having pain? Yes Pain location: low back pain NPRS scale:  current 8/10  Aggravating factors: bending and lifting, standing (1/2 hour), sitting (1 hour)           NPRS, highest: 9/10 Relieving factors: changing positions, ice, heat, medication, rest           NPRS: best: 2/10 Pain description: intermittent, constant, burning, stabbing, and "nerve" Stage: Chronic Stability: staying the same 24 hour pattern: better in the morning, then increasing throughout the day.    OBJECTIVE: (objective measures completed at initial evaluation unless otherwise dated)  DIAGNOSTIC FINDINGS:  None current   GENERAL OBSERVATION/GAIT:           Increased lumbar lordosis, R sided rib elevation in lumbar flexion, increased lumbar lordosis,   SENSATION:          Light touch: Appears intact   MUSCLE LENGTH: Hamstrings: Right no restriction; Left no restriction ASLR: Right ASLR = PSLR; Left ASLR = PSLR Harlan test: Right significant restriction; Left significant restriction Ely's test: Right significant restriction; Left significant restriction                     Tight piriformis bil       LUMBAR AROM   AROM AROM  04/01/2022  Flexion WNL  Extension WNL, w/ concordant pain  Right lateral flexion WNL, w/ concordant pain  Left lateral flexion WNL, w/ concordant pain  Right rotation limited by 25%, w/ concordant pain  Left rotation limited by 25%, w/ no pain    (Blank rows = not tested)   DIRECTIONAL PREFERENCE:           none   LE MMT:   MMT Right 04/01/2022 Left 04/01/2022  Hip flexion (L2, L3) 4 4  Knee extension (L3) 4 4  Knee flexion 4 4  Hip abduction 3+ 4  Hip extension 3+ 4  Hip external  rotation      Hip internal rotation      Hip adduction      Ankle dorsiflexion (L4)      Ankle plantarflexion (S1)      Ankle inversion      Ankle eversion      Great Toe ext (L5)      Grossly        (Blank rows = not tested, score listed is out of 5 possible points.  N = WNL, D = diminished, C = clear for gross weakness with myotome testing, * = concordant pain with testing)     LE ROM:   ROM Right 04/01/2022 Left 04/01/2022  Hip flexion 90 90  Hip extension limited limited  Hip abduction      Hip adduction  Hip internal rotation limited limited  Hip external rotation      Knee flexion      Knee extension      Ankle dorsiflexion      Ankle plantarflexion      Ankle inversion      Ankle eversion         (Blank rows = not tested, N = WNL, * = concordant pain with testing)   Functional Tests   Eval (04/01/2022)      Sustained supine bridge (dominant leg extended at 120'', if reached): 19'' (norm 170'')        Standard plank from knees: 27'' (norm 70'' from feet)                                                                                               LUMBAR SPECIAL TESTS:  Straight leg raise: L (-), R (-) Slump: L (-), R (-) SI: fortin's sign, compression, sacral thrust (+)    PALPATION:            TTP bil PSIS, sacrum, TTP lumbar paraspinals L1-L5    SPINAL SEGMENTAL MOBILITY ASSESSMENT:  Painful, increase lordosis lumbar spine   PATIENT SURVEYS:  Modified Oswestry 29->58% disability      TODAY'S TREATMENT  [facility] Adult PT Treatment:                                                DATE: 04/22/2022 Therapeutic Exercise: Sidelying open book x10 BI Bridges 2x10 Supine clamshells GTB 2x10 Marching against GTB 2x10 BIL Supine figure 4 piriformis stretch 2x30" BIL LTR x10 BIL Supine butterfly stretch x1' Sidelying hip abduction 2x10   [facility] Adult PT Treatment:                                                DATE: 04/18/2022 Therapeutic  Exercise: Marcello Moores stretch x64min BIL Bridge isometric with marching 3x20 Seated figure 4 piriformis stretch 2x30" BIL Self Care: Tennis ball self massage Theracane self massage  Manual Therapy: STM to Rt side lumbar paraspinals    [facility] Adult PT Treatment:                                                DATE: 04/15/2022 Therapeutic Exercise: Sidelying open book x10 BIL Supine 90/90 abdominal isometric 3x30sec Cashwell stretch x59min BIL Side knee plank with clams 2x10 BIL Bridge isometric with marching 3x20 Prone swimmers with 2.5# ankle weights 3x20 Knee plank x3 to failure Manual Therapy: N/A Neuromuscular re-ed: N/A Therapeutic Activity: N/A Modalities: N/A Self Care: N/A     PATIENT EDUCATION:  POC, diagnosis, prognosis, HEP, and outcome measures.  Pt educated via explanation, demonstration, and handout (HEP).  Pt confirms understanding verbally.    HOME EXERCISE PROGRAM: Access Code: [email or web address] URL: [email or web address] Date: 04/01/2022 Prepared by: Shearon Balo   Exercises - Hip Flexor Stretch at [facility] of Bed  - 2 x daily - 7 x weekly - 2 sets - 45 second hold - Supine Posterior Pelvic Tilt  - 2 x daily - 7 x weekly - 2 sets - 10 reps - 5'' hold - Supine Hip Adduction Isometric with Ball  - 1 x daily - 7 x weekly - 1 sets - 10 reps - 10'' hold - Supine Bridge  - 1 x daily - 7 x weekly - 2 sets - 10 reps - 5 second hold hold  Added 04/15/2022: - Marching Bridge  - 1 x daily - 7 x weekly - 3 sets - 20 reps - Prone Swimmer  - 1 x daily - 7 x weekly - 3 sets - 20 reps - Plank on Knees  - 1 x daily - 7 x weekly - 3 sets - to failure hold   ASTERISK SIGNS     Asterisk Signs Eval (04/01/2022)           piriformis Sig restriction            Hip flexors Sig restriction            bridge 19''            Plank from knees 27''            R hip ext and abd MMT 3+                ASSESSMENT:   CLINICAL IMPRESSION: Patient presents to PT with very high  pain and reports recent HEP non-compliance due to high pain. She also states her pain has been radiating down both legs this week. Session today continued to focus on core and proximal hip strengthening as well as lumbar ROM without further exacerbating pain. She was somewhat limited by pain this session. Patient continues to benefit from skilled PT services and should be progressed as able to improve functional independence.     OBJECTIVE IMPAIRMENTS: Pain, core strength, hip and LE strength   ACTIVITY LIMITATIONS: lifting, walking, standing, child care, housework   PERSONAL FACTORS: See medical history and pertinent history       GOALS:     SHORT TERM GOALS: Target date: 04/22/2022   Mishay will be >75% HEP compliant to improve carryover between sessions and facilitate independent management of condition   Evaluation (04/01/2022): ongoing Goal status: INITIAL     LONG TERM GOALS: Target date: 05/27/2022   Dusti will show a >/= 12 pt improvement in their ODI score (MCID is 12 pts) as a proxy for functional improvement    Evaluation/Baseline (04/01/2022): 58%  Goal status: INITIAL     2.  Zynae will be able to maintain supine bridge for 60'' (dominant leg extended if 120'' reached) as evidence of improved hip extension and core strength (norm for healthy adult ~170'')    Evaluation/Baseline (04/01/2022): 19'' Goal status: INITIAL     3.  Kayleann will be able to maintain plank for 33'' from knees (norm for healthy adult is ~70'' from feet)    Evaluation/Baseline (04/01/2022): 27'' from knees Goal status: INITIAL     4.  Dannielle will be able to complete housework including laundry, not limited by pain   Evaluation/Baseline (04/01/2022): limited Goal status: INITIAL     5.  Lupie will self report >/=  50% decrease in pain from evaluation    Evaluation/Baseline (04/01/2022): 9/10 max pain Goal status: INITIAL     PLAN: PT FREQUENCY: 1-2x/week   PT DURATION: 8 weeks (Ending  05/27/2022)   PLANNED INTERVENTIONS: Therapeutic exercises, Aquatic therapy, Therapeutic activity, Neuro Muscular re-education, Gait training, Patient/Family education, Joint mobilization, Dry Needling, Electrical stimulation, Spinal mobilization and/or manipulation, Moist heat, Taping, Vasopneumatic device, Ionotophoresis 4mg /ml Dexamethasone, and Manual therapy   PLAN FOR NEXT SESSION: progressive core and hip strength, hip flexor/piriformis stretching    Evelene Croon, PTA 04/22/22 6:29 PM

## 2022-04-25 ENCOUNTER — Ambulatory Visit: Payer: Medicaid Other

## 2022-04-25 DIAGNOSIS — M542 Cervicalgia: Secondary | ICD-10-CM | POA: Diagnosis not present

## 2022-04-25 DIAGNOSIS — M6281 Muscle weakness (generalized): Secondary | ICD-10-CM | POA: Diagnosis not present

## 2022-04-25 DIAGNOSIS — M545 Low back pain, unspecified: Secondary | ICD-10-CM | POA: Diagnosis not present

## 2022-04-25 NOTE — Therapy (Signed)
OUTPATIENT PHYSICAL THERAPY TREATMENT NOTE/ RE-AUTHORIZATION   Patient Name: Janet Rich MRN: [id number] DOB:February 10, 1996, 26 y.o., female Today's Date: 04/25/2022  PCP: Bo Merino, FNP REFERRING PROVIDER: Meeler, Sherren Kerns, FNP  END OF SESSION:   PT End of Session - 04/25/22 1034     Visit Number 6    Date for PT Re-Evaluation 05/27/22    Authorization Type [employer] (VL 27) - pending additional auth    Authorization Time Period 04/01/2022-06/01/2022    Authorization - Visit Number 6    Authorization - Number of Visits 6    PT Start Time 1033    PT Stop Time 1113    PT Time Calculation (min) 40 min    Activity Tolerance Patient tolerated treatment well;Patient limited by pain    Behavior During Therapy WFL for tasks assessed/performed                 Past Medical History:  Diagnosis Date   Anemia    with periods   Ankle fracture    right   Anxiety    Asthma    Back pain    Bipolar 1 disorder (HCC)    Depression    Bipolar    Endometriosis    Fibromyalgia    GERD (gastroesophageal reflux disease)    Headache    MIGRAINES   IBS (irritable bowel syndrome)    Ovarian cyst    PCOS (polycystic ovarian syndrome)    Pneumonia    age 43   PONV (postoperative nausea and vomiting)    nausea after wisdom teeth extraction   Scoliosis    Vaginal Pap smear, abnormal    bx ok   Past Surgical History:  Procedure Laterality Date   ABDOMINAL HYSTERECTOMY     FRACTURE SURGERY     ankle surgery with impant    HYSTEROSCOPY WITH D & C N/A 09/22/2019   Procedure: DILATATION AND CURETTAGE /HYSTEROSCOPY;  Surgeon: Ward, Honor Loh, MD;  Location: [facility];  Service: Gynecology;  Laterality: N/A;   LYSIS OF ADHESION N/A 05/08/2021   Procedure: LYSIS OF ADHESION, EXCISION OF ENDOMETRIOSIS;  Surgeon: Benjaman Kindler, MD;  Location: [facility];  Service: Gynecology;  Laterality: N/A;   ROBOTIC ASSISTED DIAGNOSTIC LAPAROSCOPY N/A 02/18/2017   Procedure: ROBOTIC ASSISTED  DIAGNOSTIC LAPAROSCOPY,EXCISION AND ABLATION OF ENDOMETRIOSIS,LYSIS OF ADHESIONS;  Surgeon: Brien Few, MD;  Location: [facility];  Service: Gynecology;  Laterality: N/A;   ROBOTIC ASSISTED LAPAROSCOPIC HYSTERECTOMY AND SALPINGECTOMY Bilateral 05/08/2021   Procedure: XI ROBOTIC ASSISTED TOTAL LAPAROSCOPIC HYSTERECTOMY AND SALPINGECTOMY;  Surgeon: Benjaman Kindler, MD;  Location: [facility];  Service: Gynecology;  Laterality: Bilateral;   VAGINAL DELIVERY N/A 08/14/2020   Procedure: VAGINAL DELIVERY;  Surgeon: Ouida Sills Gwen Her, MD;  Location: [facility];  Service: Obstetrics;  Laterality: N/A;   [place] EXTRACTION  2013   Patient Active Problem List   Diagnosis Date Noted   Fibromyalgia syndrome 03/09/2022   Chronic back pain 03/09/2022   Excessive daytime sleepiness 11/28/2021   Endometriosis determined by laparoscopy 05/08/2021   Myalgia 03/11/2021   Elevated BP without diagnosis of hypertension 01/08/2021   Dichorionic diamniotic twin pregnancy in third trimester 08/14/2020   Preeclampsia 08/14/2020   Twin pregnancy delivered vaginally 08/14/2020   Preterm labor 06/17/2020   Right trigeminal neuralgia 11/08/2019   Endometriosis 03/27/2019   PCOS (polycystic ovarian syndrome) 03/27/2019   Moderate persistent asthma 03/07/2019   Post-nasal drainage 03/07/2019   Irritable bowel syndrome with diarrhea 03/07/2019   Gastroesophageal reflux  disease without esophagitis 03/07/2019   Class 2 obesity due to excess calories without serious comorbidity with body mass index (BMI) of 38.0 to 38.9 in adult 09/02/2018   Bipolar 2 disorder (HCC) 08/12/2018   History of low birth weight 03/11/2018   Vulvar atrophy 03/11/2018   History of migraine headaches 05/01/2015   Anxiety and depression 05/01/2015    REFERRING DIAG: Dorsalgia, unspecified [M54.9], Sciatica, unspecified side [M54.30]  THERAPY DIAG:  Low back pain, unspecified back pain laterality, unspecified chronicity, unspecified  whether sciatica present  Cervicalgia  Muscle weakness  Rationale for Evaluation and Treatment Rehabilitation  PERTINENT HISTORY: Bipolar, fibromyalgia, headaches, scoliosis, asthma, hysterectomy, anxiety, depression, PTSD   PRECAUTIONS: None  SUBJECTIVE: Pt reports feeling better than her last visit. She reports she made an exercise journal and plans on progressing an independent exercise plan after PT.  PAIN:  Are you having pain? Yes Pain location: low back pain NPRS scale:  current 4/10  Aggravating factors: bending and lifting, standing (1/2 hour), sitting (1 hour)           NPRS, highest: 9/10 Relieving factors: changing positions, ice, heat, medication, rest           NPRS: best: 2/10 Pain description: intermittent, constant, burning, stabbing, and "nerve" Stage: Chronic Stability: staying the same 24 hour pattern: better in the morning, then increasing throughout the day.    OBJECTIVE: (objective measures completed at initial evaluation unless otherwise dated)  DIAGNOSTIC FINDINGS:  None current   GENERAL OBSERVATION/GAIT:           Increased lumbar lordosis, R sided rib elevation in lumbar flexion, increased lumbar lordosis,   SENSATION:          Light touch: Appears intact   MUSCLE LENGTH: Hamstrings: Right no restriction; Left no restriction ASLR: Right ASLR = PSLR; Left ASLR = PSLR Zeller test: Right significant restriction; Left significant restriction Ely's test: Right significant restriction; Left significant restriction                     Tight piriformis bil       LUMBAR AROM   AROM AROM  04/01/2022 AROM 04/25/2022  Flexion WNL WNL  Extension WNL, w/ concordant pain WNL, w/ concordant pain  Right lateral flexion WNL, w/ concordant pain WNL, minor pain  Left lateral flexion WNL, w/ concordant pain WNL, minor pain  Right rotation limited by 25%, w/ concordant pain WNL  Left rotation limited by 25%, w/ no pain limited by 25%, w/ no pain     (Blank rows = not tested)   DIRECTIONAL PREFERENCE:           none   LE MMT:   MMT Right 04/01/2022 Left 04/01/2022 Right 04/25/2022 Left 04/25/2022  Hip flexion (L2, L3) 4 4 5/5 5/5  Knee extension (L3) 4 4    Knee flexion 4 4    Hip abduction 3+ 4 5/5 4+/5  Hip extension 3+ 4 4/5 5/5  Hip external rotation        Hip internal rotation        Hip adduction        Ankle dorsiflexion (L4)        Ankle plantarflexion (S1)        Ankle inversion        Ankle eversion        Great Toe ext (L5)        Grossly          (  Blank rows = not tested, score listed is out of 5 possible points.  N = WNL, D = diminished, C = clear for gross weakness with myotome testing, * = concordant pain with testing)     LE ROM:   ROM Right 04/01/2022 Left 04/01/2022  Hip flexion 90 90  Hip extension limited limited  Hip abduction      Hip adduction      Hip internal rotation limited limited  Hip external rotation      Knee flexion      Knee extension      Ankle dorsiflexion      Ankle plantarflexion      Ankle inversion      Ankle eversion         (Blank rows = not tested, N = WNL, * = concordant pain with testing)   Functional Tests   Eval (04/01/2022)  04/25/2022    Sustained supine bridge (dominant leg extended at 120'', if reached): 19'' (norm 170'')    35"    Standard plank from knees: 27'' (norm 70'' from feet)  20" from feet                                                                                             LUMBAR SPECIAL TESTS:  Straight leg raise: L (-), R (-) Slump: L (-), R (-) SI: fortin's sign, compression, sacral thrust (+)    PALPATION:            TTP bil PSIS, sacrum, TTP lumbar paraspinals L1-L5    SPINAL SEGMENTAL MOBILITY ASSESSMENT:  Painful, increase lordosis lumbar spine   PATIENT SURVEYS:  Modified Oswestry 29->58% disability  04/25/2022: 29 ->58%     TODAY'S TREATMENT   [facility] Adult PT Treatment:                                                 DATE: 04/25/2022 Therapeutic Exercise: Supine 90/90 abdominal isometric with handhold resistance 3x30sec Side knee plank 2x30sec BIL Prone swimmers 3x20 Manual Therapy: N/A Neuromuscular re-ed: N/A Therapeutic Activity: Re-assessment of objective measures with pt education Re-administration of ODI with pt education Modalities: N/A Self Care: N/A   [facility] Adult PT Treatment:                                                DATE: 04/22/2022 Therapeutic Exercise: Sidelying open book x10 BI Bridges 2x10 Supine clamshells GTB 2x10 Marching against GTB 2x10 BIL Supine figure 4 piriformis stretch 2x30" BIL LTR x10 BIL Supine butterfly stretch x1' Sidelying hip abduction 2x10   [facility] Adult PT Treatment:  DATE: 04/18/2022 Therapeutic Exercise: Marcello Moores stretch x37min BIL Bridge isometric with marching 3x20 Seated figure 4 piriformis stretch 2x30" BIL Self Care: Tennis ball self massage Theracane self massage  Manual Therapy: STM to Rt side lumbar paraspinals        PATIENT EDUCATION:  POC, diagnosis, prognosis, HEP, and outcome measures.  Pt educated via explanation, demonstration, and handout (HEP).  Pt confirms understanding verbally.    HOME EXERCISE PROGRAM: Access Code: [email or web address] URL: [email or web address] Date: 04/01/2022 Prepared by: Shearon Balo   Exercises - Hip Flexor Stretch at [facility] of Bed  - 2 x daily - 7 x weekly - 2 sets - 45 second hold - Supine Posterior Pelvic Tilt  - 2 x daily - 7 x weekly - 2 sets - 10 reps - 5'' hold - Supine Hip Adduction Isometric with Ball  - 1 x daily - 7 x weekly - 1 sets - 10 reps - 10'' hold - Supine Bridge  - 1 x daily - 7 x weekly - 2 sets - 10 reps - 5 second hold hold  Added 04/15/2022: - Marching Bridge  - 1 x daily - 7 x weekly - 3 sets - 20 reps - Prone Swimmer  - 1 x daily - 7 x weekly - 3 sets - 20 reps - Plank on Knees  - 1 x daily - 7 x weekly - 3 sets - to  failure hold   ASTERISK SIGNS     Asterisk Signs Eval (04/01/2022)           piriformis Sig restriction            Hip flexors Sig restriction            bridge 19''            Plank from knees 27''            R hip ext and abd MMT 3+                ASSESSMENT:   CLINICAL IMPRESSION: Pt responded well to all interventions today, demonstrating good form and no increase in pain with selected exercises. Upon re-assessment, the pt has made good progress in core and hip strength, as well as global lumbar AROM. She will continue to benefit from skilled PT to address her primary impairments and return to her prior level of function with less limitation.    OBJECTIVE IMPAIRMENTS: Pain, core strength, hip and LE strength   ACTIVITY LIMITATIONS: lifting, walking, standing, child care, housework   PERSONAL FACTORS: See medical history and pertinent history       GOALS:     SHORT TERM GOALS: Target date: 04/22/2022   Sherrita will be >75% HEP compliant to improve carryover between sessions and facilitate independent management of condition   Evaluation (04/01/2022): ongoing 04/25/2022: Pt reports adherence to her HEP Goal status: ACHIEVED     LONG TERM GOALS: Target date: 05/27/2022   Ireri will show a >/= 12 pt improvement in their ODI score (MCID is 12 pts) as a proxy for functional improvement    Evaluation/Baseline (04/01/2022): 58%  04/25/2022: 58% Goal status: PROGRESSING     2.  Emalyn will be able to maintain supine bridge for 60'' (dominant leg extended if 120'' reached) as evidence of improved hip extension and core strength (norm for healthy adult ~170'')    Evaluation/Baseline (04/01/2022): 19'' 04/25/2022: 35" Goal status: PROGRESSING     3.  Enza will be able to maintain plank  for 60'' from knees (norm for healthy adult is ~70'' from feet)    Evaluation/Baseline (04/01/2022): 27'' from knees 04/25/2022: 20 seconds from feet Goal status: PROGRESSING     4.  Michael will  be able to complete housework including laundry, not limited by pain   Evaluation/Baseline (04/01/2022): limited Goal status: INITIAL     5.  Azrielle will self report >/= 50% decrease in pain from evaluation    Evaluation/Baseline (04/01/2022): 9/10 max pain Goal status: INITIAL     PLAN: PT FREQUENCY: 1-2x/week   PT DURATION: 8 weeks (Ending 05/27/2022)   PLANNED INTERVENTIONS: Therapeutic exercises, Aquatic therapy, Therapeutic activity, Neuro Muscular re-education, Gait training, Patient/Family education, Joint mobilization, Dry Needling, Electrical stimulation, Spinal mobilization and/or manipulation, Moist heat, Taping, Vasopneumatic device, Ionotophoresis 4mg /ml Dexamethasone, and Manual therapy   PLAN FOR NEXT SESSION: progressive core and hip strength, hip flexor/piriformis stretching  [employer] Authorization   Choose one: Rehabilitative  Standardized Assessment or Functional Outcome Tool: See Pain Assessment and Oswestry  Score or Percent Disability: 58%  Body Parts Treated (Select each separately):  Lumbopelvic. Overall deficits/functional limitations for body part selected: moderate N/A. Overall deficits/functional limitations for body part selected: N/A N/A. Overall deficits/functional limitations for body part selected: N/A   If treatment provided at initial evaluation, no treatment charged due to lack of authorization.    , PT, DPT 04/25/22 11:17 AM

## 2022-04-27 DIAGNOSIS — F3181 Bipolar II disorder: Secondary | ICD-10-CM | POA: Diagnosis not present

## 2022-04-27 DIAGNOSIS — F900 Attention-deficit hyperactivity disorder, predominantly inattentive type: Secondary | ICD-10-CM | POA: Diagnosis not present

## 2022-04-27 DIAGNOSIS — Z79899 Other long term (current) drug therapy: Secondary | ICD-10-CM | POA: Diagnosis not present

## 2022-04-29 ENCOUNTER — Ambulatory Visit: Payer: Medicaid Other | Admitting: Physical Therapy

## 2022-04-29 ENCOUNTER — Encounter: Payer: Self-pay | Admitting: Physical Therapy

## 2022-04-29 DIAGNOSIS — M6281 Muscle weakness (generalized): Secondary | ICD-10-CM

## 2022-04-29 DIAGNOSIS — M542 Cervicalgia: Secondary | ICD-10-CM | POA: Diagnosis not present

## 2022-04-29 DIAGNOSIS — M545 Low back pain, unspecified: Secondary | ICD-10-CM | POA: Diagnosis not present

## 2022-04-29 NOTE — Therapy (Signed)
OUTPATIENT PHYSICAL THERAPY TREATMENT NOTE/ RE-AUTHORIZATION   Patient Name: Janet Rich MRN: [id number] DOB:[birth year], 26 y.o., female Today's Date: 04/30/2022  PCP: Bo Merino, FNP REFERRING PROVIDER: Meeler, Sherren Kerns, FNP  END OF SESSION:   PT End of Session - 04/29/22 [place]     Visit Number 7    Date for PT Re-Evaluation 05/27/22    Authorization Type [employer] (VL 27) - pending additional auth    Authorization Time Period 04/01/2022-06/01/2022    Authorization - Visit Number 6    Authorization - Number of Visits 6   pending additional auth   PT Start Time 0630    PT Stop Time 0710    PT Time Calculation (min) 40 min    Activity Tolerance Patient tolerated treatment well;Patient limited by pain    Behavior During Therapy WFL for tasks assessed/performed                 Past Medical History:  Diagnosis Date   Anemia    with periods   Ankle fracture    right   Anxiety    Asthma    Back pain    Bipolar 1 disorder (HCC)    Depression    Bipolar    Endometriosis    Fibromyalgia    GERD (gastroesophageal reflux disease)    Headache    MIGRAINES   IBS (irritable bowel syndrome)    Ovarian cyst    PCOS (polycystic ovarian syndrome)    Pneumonia    age 46   PONV (postoperative nausea and vomiting)    nausea after wisdom teeth extraction   Scoliosis    Vaginal Pap smear, abnormal    bx ok   Past Surgical History:  Procedure Laterality Date   ABDOMINAL HYSTERECTOMY     FRACTURE SURGERY     ankle surgery with impant    HYSTEROSCOPY WITH D & C N/A 09/22/2019   Procedure: DILATATION AND CURETTAGE /HYSTEROSCOPY;  Surgeon: Ward, Honor Loh, MD;  Location: [facility];  Service: Gynecology;  Laterality: N/A;   LYSIS OF ADHESION N/A 05/08/2021   Procedure: LYSIS OF ADHESION, EXCISION OF ENDOMETRIOSIS;  Surgeon: Benjaman Kindler, MD;  Location: [facility];  Service: Gynecology;  Laterality: N/A;   ROBOTIC ASSISTED DIAGNOSTIC LAPAROSCOPY N/A 02/18/2017    Procedure: ROBOTIC ASSISTED DIAGNOSTIC LAPAROSCOPY,EXCISION AND ABLATION OF ENDOMETRIOSIS,LYSIS OF ADHESIONS;  Surgeon: Brien Few, MD;  Location: [facility];  Service: Gynecology;  Laterality: N/A;   ROBOTIC ASSISTED LAPAROSCOPIC HYSTERECTOMY AND SALPINGECTOMY Bilateral 05/08/2021   Procedure: XI ROBOTIC ASSISTED TOTAL LAPAROSCOPIC HYSTERECTOMY AND SALPINGECTOMY;  Surgeon: Benjaman Kindler, MD;  Location: [facility];  Service: Gynecology;  Laterality: Bilateral;   VAGINAL DELIVERY N/A 08/14/2020   Procedure: VAGINAL DELIVERY;  Surgeon: Ouida Sills Gwen Her, MD;  Location: [facility];  Service: Obstetrics;  Laterality: N/A;   [place] EXTRACTION  2013   Patient Active Problem List   Diagnosis Date Noted   Fibromyalgia syndrome 03/09/2022   Chronic back pain 03/09/2022   Excessive daytime sleepiness 11/28/2021   Endometriosis determined by laparoscopy 05/08/2021   Myalgia 03/11/2021   Elevated BP without diagnosis of hypertension 01/08/2021   Dichorionic diamniotic twin pregnancy in third trimester 08/14/2020   Preeclampsia 08/14/2020   Twin pregnancy delivered vaginally 08/14/2020   Preterm labor 06/17/2020   Right trigeminal neuralgia 11/08/2019   Endometriosis 03/27/2019   PCOS (polycystic ovarian syndrome) 03/27/2019   Moderate persistent asthma 03/07/2019   Post-nasal drainage 03/07/2019   Irritable bowel syndrome with diarrhea 03/07/2019  Gastroesophageal reflux disease without esophagitis 03/07/2019   Class 2 obesity due to excess calories without serious comorbidity with body mass index (BMI) of 38.0 to 38.9 in adult 09/02/2018   Bipolar 2 disorder (HCC) 08/12/2018   History of low birth weight 03/11/2018   Vulvar atrophy 03/11/2018   History of migraine headaches 05/01/2015   Anxiety and depression 05/01/2015    REFERRING DIAG: Dorsalgia, unspecified [M54.9], Sciatica, unspecified side [M54.30]  THERAPY DIAG:  Low back pain, unspecified back pain laterality,  unspecified chronicity, unspecified whether sciatica present  Cervicalgia  Muscle weakness  Rationale for Evaluation and Treatment Rehabilitation  PERTINENT HISTORY: Bipolar, fibromyalgia, headaches, scoliosis, asthma, hysterectomy, anxiety, depression, PTSD   PRECAUTIONS: None  SUBJECTIVE: Pt reports that she has had considerable pain following therapy.    PAIN:  Are you having pain? Yes Pain location: low back pain NPRS scale:  current 10/10  Aggravating factors: bending and lifting, standing (1/2 hour), sitting (1 hour)           NPRS, highest: 9/10 Relieving factors: changing positions, ice, heat, medication, rest           NPRS: best: 2/10 Pain description: intermittent, constant, burning, stabbing, and "nerve" Stage: Chronic Stability: staying the same 24 hour pattern: better in the morning, then increasing throughout the day.    OBJECTIVE: (objective measures completed at initial evaluation unless otherwise dated)  DIAGNOSTIC FINDINGS:  None current   GENERAL OBSERVATION/GAIT:           Increased lumbar lordosis, R sided rib elevation in lumbar flexion, increased lumbar lordosis,   SENSATION:          Light touch: Appears intact   MUSCLE LENGTH: Hamstrings: Right no restriction; Left no restriction ASLR: Right ASLR = PSLR; Left ASLR = PSLR Sarver test: Right significant restriction; Left significant restriction Ely's test: Right significant restriction; Left significant restriction                         Tight piriformis bil       LUMBAR AROM   AROM AROM  04/01/2022 AROM 04/25/2022  Flexion WNL WNL  Extension WNL, w/ concordant pain WNL, w/ concordant pain  Right lateral flexion WNL, w/ concordant pain WNL, minor pain  Left lateral flexion WNL, w/ concordant pain WNL, minor pain  Right rotation limited by 25%, w/ concordant pain WNL  Left rotation limited by 25%, w/ no pain limited by 25%, w/ no pain    (Blank rows = not tested)   DIRECTIONAL  PREFERENCE:           none   LE MMT:   MMT Right 04/01/2022 Left 04/01/2022 Right 04/25/2022 Left 04/25/2022  Hip flexion (L2, L3) 4 4 5/5 5/5  Knee extension (L3) 4 4    Knee flexion 4 4    Hip abduction 3+ 4 5/5 4+/5  Hip extension 3+ 4 4/5 5/5  Hip external rotation        Hip internal rotation        Hip adduction        Ankle dorsiflexion (L4)        Ankle plantarflexion (S1)        Ankle inversion        Ankle eversion        Great Toe ext (L5)        Grossly          (Blank rows = not tested, score listed  is out of 5 possible points.  N = WNL, D = diminished, C = clear for gross weakness with myotome testing, * = concordant pain with testing)     LE ROM:   ROM Right 04/01/2022 Left 04/01/2022  Hip flexion 90 90  Hip extension limited limited  Hip abduction      Hip adduction      Hip internal rotation limited limited  Hip external rotation      Knee flexion      Knee extension      Ankle dorsiflexion      Ankle plantarflexion      Ankle inversion      Ankle eversion         (Blank rows = not tested, N = WNL, * = concordant pain with testing)   Functional Tests   Eval (04/01/2022)  04/25/2022    Sustained supine bridge (dominant leg extended at 120'', if reached): 19'' (norm 170'')    35"    Standard plank from knees: 39'' (norm 70'' from feet)  20" from feet                                                                                             LUMBAR SPECIAL TESTS:  Straight leg raise: L (-), R (-) Slump: L (-), R (-) SI: fortin's sign, compression, sacral thrust (+)    PALPATION:            TTP bil PSIS, sacrum, TTP lumbar paraspinals L1-L5    SPINAL SEGMENTAL MOBILITY ASSESSMENT:  Painful, increase lordosis lumbar spine   PATIENT SURVEYS:  Modified Oswestry 29->58% disability  04/25/2022: 29 ->58%     TODAY'S TREATMENT   [facility] Adult PT Treatment:                                                DATE: 04/29/2022 Therapeutic  Exercise: nu-step L5 37m while taking subjective and planning session with patient Hip flexor stretch Piriformis stretch RF stretch LTR - 20x  Hip adduction ball squeeze - 5'' hold - 2x10 Alternating clamshell with GTB - 2x10 Alternating SLR from foam roller - 2x10 Isometric ball squeeze in hooklying - 5'' - 2x10   [facility] Adult PT Treatment:                                                DATE: 04/25/2022 Therapeutic Exercise: Supine 90/90 abdominal isometric with handhold resistance 3x30sec Side knee plank 2x30sec BIL Prone swimmers 3x20 Manual Therapy: N/A Neuromuscular re-ed: N/A Therapeutic Activity: Re-assessment of objective measures with pt education Re-administration of ODI with pt education Modalities: N/A Self Care: N/A   [facility] Adult PT Treatment:  DATE: 04/22/2022 Therapeutic Exercise: Sidelying open book x10 BI Bridges 2x10 Supine clamshells GTB 2x10 Marching against GTB 2x10 BIL Supine figure 4 piriformis stretch 2x30" BIL LTR x10 BIL Supine butterfly stretch x1' Sidelying hip abduction 2x10   [facility] Adult PT Treatment:                                                DATE: 04/18/2022 Therapeutic Exercise: Maisie Fus stretch x61min BIL Bridge isometric with marching 3x20 Seated figure 4 piriformis stretch 2x30" BIL Self Care: Tennis ball self massage Theracane self massage  Manual Therapy: STM to Rt side lumbar paraspinals        PATIENT EDUCATION:  POC, diagnosis, prognosis, HEP, and outcome measures.  Pt educated via explanation, demonstration, and handout (HEP).  Pt confirms understanding verbally.    HOME EXERCISE PROGRAM: Access Code: [email or web address] URL: [email or web address] Date: 04/01/2022 Prepared by: Alphonzo Severance   Exercises - Hip Flexor Stretch at [facility] of Bed  - 2 x daily - 7 x weekly - 2 sets - 45 second hold - Supine Posterior Pelvic Tilt  - 2 x daily - 7 x weekly - 2 sets - 10 reps -  5'' hold - Supine Hip Adduction Isometric with Ball  - 1 x daily - 7 x weekly - 1 sets - 10 reps - 10'' hold - Supine Bridge  - 1 x daily - 7 x weekly - 2 sets - 10 reps - 5 second hold hold  Added 04/15/2022: - Marching Bridge  - 1 x daily - 7 x weekly - 3 sets - 20 reps - Prone Swimmer  - 1 x daily - 7 x weekly - 3 sets - 20 reps - Plank on Knees  - 1 x daily - 7 x weekly - 3 sets - to failure hold   ASTERISK SIGNS     Asterisk Signs Eval (04/01/2022)           piriformis Sig restriction            Hip flexors Sig restriction            bridge 19''            Plank from knees 27''            R hip ext and abd MMT 3+                ASSESSMENT:   CLINICAL IMPRESSION: Pt tolerated session well.  Reduced exercise intensity today as pt reports significant pain for days after therapy for land based therapy.  Was able to complete therex with no increase in pain today with good form with mod cuing.    OBJECTIVE IMPAIRMENTS: Pain, core strength, hip and LE strength   ACTIVITY LIMITATIONS: lifting, walking, standing, child care, housework   PERSONAL FACTORS: See medical history and pertinent history       GOALS:     SHORT TERM GOALS: Target date: 04/22/2022   Janet Rich will be >75% HEP compliant to improve carryover between sessions and facilitate independent management of condition   Evaluation (04/01/2022): ongoing 04/25/2022: Pt reports adherence to her HEP Goal status: ACHIEVED     LONG TERM GOALS: Target date: 05/27/2022   Janet Rich will show a >/= 12 pt improvement in their ODI score (MCID is 12 pts) as a proxy for functional improvement  Evaluation/Baseline (04/01/2022): 58%  04/25/2022: 58% Goal status: PROGRESSING     2.  Janet Rich will be able to maintain supine bridge for 60'' (dominant leg extended if 120'' reached) as evidence of improved hip extension and core strength (norm for healthy adult ~170'')    Evaluation/Baseline (04/01/2022): 19'' 04/25/2022: 35" Goal status:  PROGRESSING     3.  Janet Rich will be able to maintain plank for 48'' from knees (norm for healthy adult is ~70'' from feet)    Evaluation/Baseline (04/01/2022): 27'' from knees 04/25/2022: 20 seconds from feet Goal status: PROGRESSING     4.  Janet Rich will be able to complete housework including laundry, not limited by pain   Evaluation/Baseline (04/01/2022): limited Goal status: INITIAL     5.  Janet Rich will self report >/= 50% decrease in pain from evaluation    Evaluation/Baseline (04/01/2022): 9/10 max pain Goal status: INITIAL     PLAN: PT FREQUENCY: 1-2x/week   PT DURATION: 8 weeks (Ending 05/27/2022)   PLANNED INTERVENTIONS: Therapeutic exercises, Aquatic therapy, Therapeutic activity, Neuro Muscular re-education, Gait training, Patient/Family education, Joint mobilization, Dry Needling, Electrical stimulation, Spinal mobilization and/or manipulation, Moist heat, Taping, Vasopneumatic device, Ionotophoresis 4mg /ml Dexamethasone, and Manual therapy   PLAN FOR NEXT SESSION: progressive core and hip strength, hip flexor/piriformis stretching     Kevan Ny Nayleen Janosik PT 04/30/22 9:34 AM

## 2022-04-30 NOTE — Therapy (Signed)
OUTPATIENT PHYSICAL THERAPY TREATMENT NOTE/ RE-AUTHORIZATION   Patient Name: Janet Rich MRN: [id number] DOB:Oct 16, 1996, 26 y.o., female Today's Date: 05/01/2022  PCP: Bo Merino, FNP REFERRING PROVIDER: Meeler, Sherren Kerns, FNP  END OF SESSION:   PT End of Session - 05/01/22 1311     Visit Number 8    Date for PT Re-Evaluation 05/27/22    Authorization Type [employer] (VL 27) - pending additional auth    Authorization Time Period 04/01/2022-06/01/2022    Authorization - Visit Number 7    Authorization - Number of Visits 18    PT Start Time [id number]    PT Stop Time 1400    PT Time Calculation (min) 45 min    Activity Tolerance Patient tolerated treatment well;Patient limited by pain    Behavior During Therapy WFL for tasks assessed/performed                  Past Medical History:  Diagnosis Date   Anemia    with periods   Ankle fracture    right   Anxiety    Asthma    Back pain    Bipolar 1 disorder (HCC)    Depression    Bipolar    Endometriosis    Fibromyalgia    GERD (gastroesophageal reflux disease)    Headache    MIGRAINES   IBS (irritable bowel syndrome)    Ovarian cyst    PCOS (polycystic ovarian syndrome)    Pneumonia    age 55   PONV (postoperative nausea and vomiting)    nausea after wisdom teeth extraction   Scoliosis    Vaginal Pap smear, abnormal    bx ok   Past Surgical History:  Procedure Laterality Date   ABDOMINAL HYSTERECTOMY     FRACTURE SURGERY     ankle surgery with impant    HYSTEROSCOPY WITH D & C N/A 09/22/2019   Procedure: DILATATION AND CURETTAGE /HYSTEROSCOPY;  Surgeon: Ward, Honor Loh, MD;  Location: [facility];  Service: Gynecology;  Laterality: N/A;   LYSIS OF ADHESION N/A 05/08/2021   Procedure: LYSIS OF ADHESION, EXCISION OF ENDOMETRIOSIS;  Surgeon: Benjaman Kindler, MD;  Location: [facility];  Service: Gynecology;  Laterality: N/A;   ROBOTIC ASSISTED DIAGNOSTIC LAPAROSCOPY N/A 02/18/2017   Procedure: ROBOTIC  ASSISTED DIAGNOSTIC LAPAROSCOPY,EXCISION AND ABLATION OF ENDOMETRIOSIS,LYSIS OF ADHESIONS;  Surgeon: Brien Few, MD;  Location: [place] [facility];  Service: Gynecology;  Laterality: N/A;   ROBOTIC ASSISTED LAPAROSCOPIC HYSTERECTOMY AND SALPINGECTOMY Bilateral 05/08/2021   Procedure: XI ROBOTIC ASSISTED TOTAL LAPAROSCOPIC HYSTERECTOMY AND SALPINGECTOMY;  Surgeon: Benjaman Kindler, MD;  Location: [facility];  Service: Gynecology;  Laterality: Bilateral;   VAGINAL DELIVERY N/A 08/14/2020   Procedure: VAGINAL DELIVERY;  Surgeon: Ouida Sills Gwen Her, MD;  Location: [facility];  Service: Obstetrics;  Laterality: N/A;   [place] EXTRACTION  2013   Patient Active Problem List   Diagnosis Date Noted   Fibromyalgia syndrome 03/09/2022   Chronic back pain 03/09/2022   Excessive daytime sleepiness 11/28/2021   Endometriosis determined by laparoscopy 05/08/2021   Myalgia 03/11/2021   Elevated BP without diagnosis of hypertension 01/08/2021   Dichorionic diamniotic twin pregnancy in third trimester 08/14/2020   Preeclampsia 08/14/2020   Twin pregnancy delivered vaginally 08/14/2020   Preterm labor 06/17/2020   Right trigeminal neuralgia 11/08/2019   Endometriosis 03/27/2019   PCOS (polycystic ovarian syndrome) 03/27/2019   Moderate persistent asthma 03/07/2019   Post-nasal drainage 03/07/2019   Irritable bowel syndrome with diarrhea 03/07/2019   Gastroesophageal  reflux disease without esophagitis 03/07/2019   Class 2 obesity due to excess calories without serious comorbidity with body mass index (BMI) of 38.0 to 38.9 in adult 09/02/2018   Bipolar 2 disorder ([place]) 08/12/2018   History of low birth weight 03/11/2018   Vulvar atrophy 03/11/2018   History of migraine headaches 05/01/2015   Anxiety and depression 05/01/2015    REFERRING DIAG: Dorsalgia, unspecified [M54.9], Sciatica, unspecified side [M54.30]  THERAPY DIAG:  Low back pain, unspecified back pain laterality, unspecified chronicity,  unspecified whether sciatica present  Cervicalgia  Muscle weakness  Rationale for Evaluation and Treatment Rehabilitation  PERTINENT HISTORY: Bipolar, fibromyalgia, headaches, scoliosis, asthma, hysterectomy, anxiety, depression, PTSD   PRECAUTIONS: None  SUBJECTIVE: Patient reports very high pain today, especially in her tailbone area that was further aggravated by having to clean her childs car seat.  PAIN:  Are you having pain? Yes Pain location: low back pain NPRS scale:  Current 10/10  Aggravating factors: bending and lifting, standing (1/2 hour), sitting (1 hour)           NPRS, highest: 9/10 Relieving factors: changing positions, ice, heat, medication, rest           NPRS: best: 2/10 Pain description: intermittent, constant, burning, stabbing, and "nerve" Stage: Chronic Stability: staying the same 24 hour pattern: better in the morning, then increasing throughout the day.    OBJECTIVE: (objective measures completed at initial evaluation unless otherwise dated)  DIAGNOSTIC FINDINGS:  None current   GENERAL OBSERVATION/GAIT:           Increased lumbar lordosis, R sided rib elevation in lumbar flexion, increased lumbar lordosis,   SENSATION:          Light touch: Appears intact   MUSCLE LENGTH: Hamstrings: Right no restriction; Left no restriction ASLR: Right ASLR = PSLR; Left ASLR = PSLR Sampsel test: Right significant restriction; Left significant restriction Ely's test: Right significant restriction; Left significant restriction                         Tight piriformis bil       LUMBAR AROM   AROM AROM  04/01/2022 AROM 04/25/2022  Flexion WNL WNL  Extension WNL, w/ concordant pain WNL, w/ concordant pain  Right lateral flexion WNL, w/ concordant pain WNL, minor pain  Left lateral flexion WNL, w/ concordant pain WNL, minor pain  Right rotation limited by 25%, w/ concordant pain WNL  Left rotation limited by 25%, w/ no pain limited by 25%, w/ no pain     (Blank rows = not tested)   DIRECTIONAL PREFERENCE:           none   LE MMT:   MMT Right 04/01/2022 Left 04/01/2022 Right 04/25/2022 Left 04/25/2022  Hip flexion (L2, L3) 4 4 5/5 5/5  Knee extension (L3) 4 4    Knee flexion 4 4    Hip abduction 3+ 4 5/5 4+/5  Hip extension 3+ 4 4/5 5/5  Hip external rotation        Hip internal rotation        Hip adduction        Ankle dorsiflexion (L4)        Ankle plantarflexion (S1)        Ankle inversion        Ankle eversion        Great Toe ext (L5)        Grossly          (  Blank rows = not tested, score listed is out of 5 possible points.  N = WNL, D = diminished, C = clear for gross weakness with myotome testing, * = concordant pain with testing)     LE ROM:   ROM Right 04/01/2022 Left 04/01/2022  Hip flexion 90 90  Hip extension limited limited  Hip abduction      Hip adduction      Hip internal rotation limited limited  Hip external rotation      Knee flexion      Knee extension      Ankle dorsiflexion      Ankle plantarflexion      Ankle inversion      Ankle eversion         (Blank rows = not tested, N = WNL, * = concordant pain with testing)   Functional Tests   Eval (04/01/2022)  04/25/2022    Sustained supine bridge (dominant leg extended at 120'', if reached): 19'' (norm 170'')    35"    Standard plank from knees: 27'' (norm 70'' from feet)  20" from feet                                                                                             LUMBAR SPECIAL TESTS:  Straight leg raise: L (-), R (-) Slump: L (-), R (-) SI: fortin's sign, compression, sacral thrust (+)    PALPATION:            TTP bil PSIS, sacrum, TTP lumbar paraspinals L1-L5    SPINAL SEGMENTAL MOBILITY ASSESSMENT:  Painful, increase lordosis lumbar spine   PATIENT SURVEYS:  Modified Oswestry 29->58% disability  04/25/2022: 29 ->58%     TODAY'S TREATMENT  [facility] Adult PT Treatment:                                                 DATE: 05/01/2022 Aquatic therapy at [address] - therapeutic pool temp 92 degrees Pt enters building ambulating in CAM boot but independently.  Treatment took place in water 3.8 to  4 ft 8 in.feet deep depending upon activity.  Pt entered and exited the pool via stair and handrails independently.   Pt pain level 10/10 at initiation of water walking.  Therapeutic Exercise: Walking forward/backwards/side stepping (side stepping leading with R painful) Figure 4 squat stretch, BIL UE support 2x30" BIL Floating on back with neck doodle Ai chi postures 1-8 for pain management, diaphragmatic breathing through postures  At edge of pool, pt performed LE exercise: Hip abd/add x20 BIL Hip ext/flex with knee straight x 20 BIL Marching hip flexion to knee extension x10 BIL Squats 2x20 Sitting on noodle with yellow dumbbells at sides Bicycle kicks x1' Flutter kicks x1' Cross country skiers x1'  Pt requires the buoyancy of water for active assisted exercises with buoyancy supported for strengthening and AROM exercises. Hydrostatic pressure also supports joints by unweighting joint load by at least 50 % in  3-4 feet depth water. 80% in chest to neck deep water. Water will provide assistance with movement using the current and laminar flow while the buoyancy reduces weight bearing. Pt requires the viscosity of the water for resistance with strengthening exercises.   [facility] Adult PT Treatment:                                                DATE: 04/29/2022 Therapeutic Exercise: nu-step L5 72m while taking subjective and planning session with patient Hip flexor stretch Piriformis stretch RF stretch LTR - 20x  Hip adduction ball squeeze - 5'' hold - 2x10 Alternating clamshell with GTB - 2x10 Alternating SLR from foam roller - 2x10 Isometric ball squeeze in hooklying - 5'' - 2x10   [facility] Adult PT Treatment:                                                DATE: 04/25/2022 Therapeutic  Exercise: Supine 90/90 abdominal isometric with handhold resistance 3x30sec Side knee plank 2x30sec BIL Prone swimmers 3x20 Manual Therapy: N/A Neuromuscular re-ed: N/A Therapeutic Activity: Re-assessment of objective measures with pt education Re-administration of ODI with pt education Modalities: N/A Self Care: N/A   [facility] Adult PT Treatment:                                                DATE: 04/22/2022 Therapeutic Exercise: Sidelying open book x10 BI Bridges 2x10 Supine clamshells GTB 2x10 Marching against GTB 2x10 BIL Supine figure 4 piriformis stretch 2x30" BIL LTR x10 BIL Supine butterfly stretch x1' Sidelying hip abduction 2x10    PATIENT EDUCATION:  POC, diagnosis, prognosis, HEP, and outcome measures.  Pt educated via explanation, demonstration, and handout (HEP).  Pt confirms understanding verbally.    HOME EXERCISE PROGRAM: Access Code: [email or web address] URL: [email or web address] Date: 04/01/2022 Prepared by: Alphonzo Severance   Exercises - Hip Flexor Stretch at [facility] of Bed  - 2 x daily - 7 x weekly - 2 sets - 45 second hold - Supine Posterior Pelvic Tilt  - 2 x daily - 7 x weekly - 2 sets - 10 reps - 5'' hold - Supine Hip Adduction Isometric with Ball  - 1 x daily - 7 x weekly - 1 sets - 10 reps - 10'' hold - Supine Bridge  - 1 x daily - 7 x weekly - 2 sets - 10 reps - 5 second hold hold  Added 04/15/2022: - Marching Bridge  - 1 x daily - 7 x weekly - 3 sets - 20 reps - Prone Swimmer  - 1 x daily - 7 x weekly - 3 sets - 20 reps - Plank on Knees  - 1 x daily - 7 x weekly - 3 sets - to failure hold   ASTERISK SIGNS     Asterisk Signs Eval (04/01/2022)           piriformis Sig restriction            Hip flexors Sig restriction            bridge 19''  Plank from knees 27''            R hip ext and abd MMT 3+                ASSESSMENT:   CLINICAL IMPRESSION: Patient presents to aquatic session with significant pain, reporting 10/10  since she woke up today. Session today focused on general conditioning as well as pain management techniques and diaphragmatic breathing to reduce overall pain perception in the aquatic environment for use of buoyancy to offload joints and the viscosity of water as resistance during therapeutic exercise. Patient was able to tolerate all prescribed exercises in the aquatic environment with no adverse effects and reports 8/10 pain at the end of the session. Patient continues to benefit from skilled PT services on land and aquatic based and should be progressed as able to improve functional independence.    OBJECTIVE IMPAIRMENTS: Pain, core strength, hip and LE strength   ACTIVITY LIMITATIONS: lifting, walking, standing, child care, housework   PERSONAL FACTORS: See medical history and pertinent history       GOALS:     SHORT TERM GOALS: Target date: 04/22/2022   Elisabet will be >75% HEP compliant to improve carryover between sessions and facilitate independent management of condition   Evaluation (04/01/2022): ongoing 04/25/2022: Pt reports adherence to her HEP Goal status: ACHIEVED     LONG TERM GOALS: Target date: 05/27/2022   Virgina will show a >/= 12 pt improvement in their ODI score (MCID is 12 pts) as a proxy for functional improvement    Evaluation/Baseline (04/01/2022): 58%  04/25/2022: 58% Goal status: PROGRESSING     2.  Chelcy will be able to maintain supine bridge for 60'' (dominant leg extended if 120'' reached) as evidence of improved hip extension and core strength (norm for healthy adult ~170'')    Evaluation/Baseline (04/01/2022): 19'' 04/25/2022: 35" Goal status: PROGRESSING     3.  Icelyn will be able to maintain plank for 60'' from knees (norm for healthy adult is ~70'' from feet)    Evaluation/Baseline (04/01/2022): 27'' from knees 04/25/2022: 20 seconds from feet Goal status: PROGRESSING     4.  Anila will be able to complete housework including laundry, not limited  by pain   Evaluation/Baseline (04/01/2022): limited Goal status: INITIAL     5.  Marinda will self report >/= 50% decrease in pain from evaluation    Evaluation/Baseline (04/01/2022): 9/10 max pain Goal status: INITIAL     PLAN: PT FREQUENCY: 1-2x/week   PT DURATION: 8 weeks (Ending 05/27/2022)   PLANNED INTERVENTIONS: Therapeutic exercises, Aquatic therapy, Therapeutic activity, Neuro Muscular re-education, Gait training, Patient/Family education, Joint mobilization, Dry Needling, Electrical stimulation, Spinal mobilization and/or manipulation, Moist heat, Taping, Vasopneumatic device, Ionotophoresis 4mg /ml Dexamethasone, and Manual therapy   PLAN FOR NEXT SESSION: progressive core and hip strength, hip flexor/piriformis stretching     PTA 05/01/22 1:14 PM

## 2022-05-01 ENCOUNTER — Ambulatory Visit: Payer: Medicaid Other

## 2022-05-01 DIAGNOSIS — M542 Cervicalgia: Secondary | ICD-10-CM

## 2022-05-01 DIAGNOSIS — M6281 Muscle weakness (generalized): Secondary | ICD-10-CM | POA: Diagnosis not present

## 2022-05-01 DIAGNOSIS — M545 Low back pain, unspecified: Secondary | ICD-10-CM | POA: Diagnosis not present

## 2022-05-04 ENCOUNTER — Ambulatory Visit: Payer: Medicaid Other | Admitting: Licensed Clinical Social Worker

## 2022-05-04 DIAGNOSIS — F3181 Bipolar II disorder: Secondary | ICD-10-CM

## 2022-05-04 DIAGNOSIS — F411 Generalized anxiety disorder: Secondary | ICD-10-CM

## 2022-05-04 NOTE — Progress Notes (Signed)
Counselor/Therapist Progress Note  [name], MRN: [id number],    Date: 05/04/2022  Time Spent: 45 minutes   Treatment Type: Psychotherapy  Reported Symptoms:  Overall stable mood  Mental Status Exam:  Appearance:   Fairly Groomed     Behavior:  Appropriate and Sharing  Motor:  Normal  Speech/Language:   Clear and Coherent and Normal Rate  Affect:  Appropriate, Congruent, and Full Range  Mood:  normal  Thought process:  normal  Thought content:    WNL  Sensory/Perceptual disturbances:    WNL  Orientation:  oriented to person, place, time/date, and situation  Attention:  Fair  Concentration:  Fair  Memory:  WNL  Fund of knowledge:   Good  Insight:    Good  Judgment:   Good  Impulse Control:  Good   Risk Assessment: Danger to Self:  No Self-injurious Behavior: No Danger to Others: No Duty to Warn:no Physical Aggression / Violence:No  Access to Firearms a concern: No  Gang Involvement:No   Subjective: Patient was engaged and cooperative throughout the session using time to discuss thoughts and feelings. Patient was receptive to feedback and intervention from LCSW. Patient voices that she is ready to transition care to another therapist and has an appointment scheduled but would like to meet a final time after she meets with this therapist to close out. Patient is likely to benefit from future treatment because they remain motivated to decrease symptoms and improve functioning and reports benefit of regular sessions.      Interventions: Cognitive Behavioral Therapy Established psychological safety. Checked in with patient regarding her week, overall stable mood. Discussed transfer of care. LCSW assisted patient in processing their thoughts and emotions about recent stressors, relationship with mom, transition care and recent vacation. LCSW reviewed thoughts vs feelings, validated patient's feelings of sadness. LCSW reviewed opposite emotion and cultivating the  felt sense. Provided support through active listening, validation of feelings, and highlighted patient's strengths.    Diagnosis:   ICD-10-CM   1. Generalized anxiety disorder  F41.1     2. Bipolar 2 disorder (HCC)  F31.81      Plan: Close out services at next session- patient to transfer to new therapist.   Future Appointments  Date Time Provider Department Center  05/06/2022  6:30 PM Brunetta Genera [facility]  05/08/2022  1:15 PM Harland German, PTA [facility]  05/13/2022  6:30 PM Fredderick Phenix, PT [facility]  05/15/2022  1:15 PM Harland German, PTA [facility]  05/20/2022  6:30 PM Brunetta Genera [facility]  05/22/2022 12:30 PM Harland German, PTA [facility]  05/25/2022 11:00 AM Kathreen Cosier, LCSW [facility] None  05/27/2022  6:30 PM Zadie Rhine, PT [facility]  05/29/2022  1:15 PM Harland German, PTA [facility]  06/03/2022  6:30 PM Zadie Rhine, PT [facility]  06/10/2022  6:30 PM Zadie Rhine, PT [facility]  06/12/2022 12:30 PM Harland German, PTA [facility]    Kathreen Cosier, [place]

## 2022-05-06 ENCOUNTER — Ambulatory Visit: Payer: Medicaid Other | Admitting: Physical Therapy

## 2022-05-07 DIAGNOSIS — F411 Generalized anxiety disorder: Secondary | ICD-10-CM | POA: Diagnosis not present

## 2022-05-07 DIAGNOSIS — F4312 Post-traumatic stress disorder, chronic: Secondary | ICD-10-CM | POA: Diagnosis not present

## 2022-05-07 DIAGNOSIS — F330 Major depressive disorder, recurrent, mild: Secondary | ICD-10-CM | POA: Diagnosis not present

## 2022-05-07 NOTE — Therapy (Signed)
OUTPATIENT PHYSICAL THERAPY TREATMENT NOTE/ RE-AUTHORIZATION   Patient Name: Janet Rich MRN: [id number] DOB:[birth year], 26 y.o., female Today's Date: 05/08/2022  PCP: Bo Merino, FNP REFERRING PROVIDER: Meeler, Sherren Kerns, FNP  END OF SESSION:   PT End of Session - 05/08/22 1256     Visit Number 9    Date for PT Re-Evaluation 05/27/22    Authorization Type [employer] (VL 27) - pending additional auth    Authorization Time Period 04/01/2022-06/01/2022    Authorization - Visit Number 9    Authorization - Number of Visits 11    PT Start Time 1300    PT Stop Time 1345    PT Time Calculation (min) 45 min    Activity Tolerance Patient tolerated treatment well    Behavior During Therapy WFL for tasks assessed/performed                   Past Medical History:  Diagnosis Date   Anemia    with periods   Ankle fracture    right   Anxiety    Asthma    Back pain    Bipolar 1 disorder (HCC)    Depression    Bipolar    Endometriosis    Fibromyalgia    GERD (gastroesophageal reflux disease)    Headache    MIGRAINES   IBS (irritable bowel syndrome)    Ovarian cyst    PCOS (polycystic ovarian syndrome)    Pneumonia    age 15   PONV (postoperative nausea and vomiting)    nausea after wisdom teeth extraction   Scoliosis    Vaginal Pap smear, abnormal    bx ok   Past Surgical History:  Procedure Laterality Date   ABDOMINAL HYSTERECTOMY     FRACTURE SURGERY     ankle surgery with impant    HYSTEROSCOPY WITH D & C N/A 09/22/2019   Procedure: DILATATION AND CURETTAGE /HYSTEROSCOPY;  Surgeon: Ward, Honor Loh, MD;  Location: [facility];  Service: Gynecology;  Laterality: N/A;   LYSIS OF ADHESION N/A 05/08/2021   Procedure: LYSIS OF ADHESION, EXCISION OF ENDOMETRIOSIS;  Surgeon: Benjaman Kindler, MD;  Location: [facility];  Service: Gynecology;  Laterality: N/A;   ROBOTIC ASSISTED DIAGNOSTIC LAPAROSCOPY N/A 02/18/2017   Procedure: ROBOTIC ASSISTED DIAGNOSTIC  LAPAROSCOPY,EXCISION AND ABLATION OF ENDOMETRIOSIS,LYSIS OF ADHESIONS;  Surgeon: Brien Few, MD;  Location: [place] [facility];  Service: Gynecology;  Laterality: N/A;   ROBOTIC ASSISTED LAPAROSCOPIC HYSTERECTOMY AND SALPINGECTOMY Bilateral 05/08/2021   Procedure: XI ROBOTIC ASSISTED TOTAL LAPAROSCOPIC HYSTERECTOMY AND SALPINGECTOMY;  Surgeon: Benjaman Kindler, MD;  Location: [facility];  Service: Gynecology;  Laterality: Bilateral;   VAGINAL DELIVERY N/A 08/14/2020   Procedure: VAGINAL DELIVERY;  Surgeon: Ouida Sills Gwen Her, MD;  Location: [facility];  Service: Obstetrics;  Laterality: N/A;   [place] EXTRACTION  2013   Patient Active Problem List   Diagnosis Date Noted   Fibromyalgia syndrome 03/09/2022   Chronic back pain 03/09/2022   Excessive daytime sleepiness 11/28/2021   Endometriosis determined by laparoscopy 05/08/2021   Myalgia 03/11/2021   Elevated BP without diagnosis of hypertension 01/08/2021   Dichorionic diamniotic twin pregnancy in third trimester 08/14/2020   Preeclampsia 08/14/2020   Twin pregnancy delivered vaginally 08/14/2020   Preterm labor 06/17/2020   Right trigeminal neuralgia 11/08/2019   Endometriosis 03/27/2019   PCOS (polycystic ovarian syndrome) 03/27/2019   Moderate persistent asthma 03/07/2019   Post-nasal drainage 03/07/2019   Irritable bowel syndrome with diarrhea 03/07/2019   Gastroesophageal reflux disease  without esophagitis 03/07/2019   Class 2 obesity due to excess calories without serious comorbidity with body mass index (BMI) of 38.0 to 38.9 in adult 09/02/2018   Bipolar 2 disorder (HCC) 08/12/2018   History of low birth weight 03/11/2018   Vulvar atrophy 03/11/2018   History of migraine headaches 05/01/2015   Anxiety and depression 05/01/2015    REFERRING DIAG: Dorsalgia, unspecified [M54.9], Sciatica, unspecified side [M54.30]  THERAPY DIAG:  Low back pain, unspecified back pain laterality, unspecified chronicity, unspecified whether  sciatica present  Cervicalgia  Muscle weakness  Rationale for Evaluation and Treatment Rehabilitation  PERTINENT HISTORY: Bipolar, fibromyalgia, headaches, scoliosis, asthma, hysterectomy, anxiety, depression, PTSD   PRECAUTIONS: None  SUBJECTIVE: Patient reports decreased pain this session.  PAIN:  Are you having pain? Yes Pain location: low back pain NPRS scale:  Current 3/10  Aggravating factors: bending and lifting, standing (1/2 hour), sitting (1 hour)           NPRS, highest: 9/10 Relieving factors: changing positions, ice, heat, medication, rest           NPRS: best: 2/10 Pain description: intermittent, constant, burning, stabbing, and "nerve" Stage: Chronic Stability: staying the same 24 hour pattern: better in the morning, then increasing throughout the day.    OBJECTIVE: (objective measures completed at initial evaluation unless otherwise dated)  DIAGNOSTIC FINDINGS:  None current   GENERAL OBSERVATION/GAIT:           Increased lumbar lordosis, R sided rib elevation in lumbar flexion, increased lumbar lordosis,   SENSATION:          Light touch: Appears intact   MUSCLE LENGTH: Hamstrings: Right no restriction; Left no restriction ASLR: Right ASLR = PSLR; Left ASLR = PSLR Napolitano test: Right significant restriction; Left significant restriction Ely's test: Right significant restriction; Left significant restriction                         Tight piriformis bil       LUMBAR AROM   AROM AROM  04/01/2022 AROM 04/25/2022  Flexion WNL WNL  Extension WNL, w/ concordant pain WNL, w/ concordant pain  Right lateral flexion WNL, w/ concordant pain WNL, minor pain  Left lateral flexion WNL, w/ concordant pain WNL, minor pain  Right rotation limited by 25%, w/ concordant pain WNL  Left rotation limited by 25%, w/ no pain limited by 25%, w/ no pain    (Blank rows = not tested)   DIRECTIONAL PREFERENCE:           none   LE MMT:   MMT Right 04/01/2022  Left 04/01/2022 Right 04/25/2022 Left 04/25/2022  Hip flexion (L2, L3) 4 4 5/5 5/5  Knee extension (L3) 4 4    Knee flexion 4 4    Hip abduction 3+ 4 5/5 4+/5  Hip extension 3+ 4 4/5 5/5  Hip external rotation        Hip internal rotation        Hip adduction        Ankle dorsiflexion (L4)        Ankle plantarflexion (S1)        Ankle inversion        Ankle eversion        Great Toe ext (L5)        Grossly          (Blank rows = not tested, score listed is out of 5 possible points.  N =  WNL, D = diminished, C = clear for gross weakness with myotome testing, * = concordant pain with testing)     LE ROM:   ROM Right 04/01/2022 Left 04/01/2022  Hip flexion 90 90  Hip extension limited limited  Hip abduction      Hip adduction      Hip internal rotation limited limited  Hip external rotation      Knee flexion      Knee extension      Ankle dorsiflexion      Ankle plantarflexion      Ankle inversion      Ankle eversion         (Blank rows = not tested, N = WNL, * = concordant pain with testing)   Functional Tests   Eval (04/01/2022)  04/25/2022    Sustained supine bridge (dominant leg extended at 120'', if reached): 19'' (norm 170'')    35"    Standard plank from knees: 27'' (norm 70'' from feet)  20" from feet                                                                                             LUMBAR SPECIAL TESTS:  Straight leg raise: L (-), R (-) Slump: L (-), R (-) SI: fortin's sign, compression, sacral thrust (+)    PALPATION:            TTP bil PSIS, sacrum, TTP lumbar paraspinals L1-L5    SPINAL SEGMENTAL MOBILITY ASSESSMENT:  Painful, increase lordosis lumbar spine   PATIENT SURVEYS:  Modified Oswestry 29->58% disability  04/25/2022: 29 ->58%     TODAY'S TREATMENT  [facility] Adult PT Treatment:                                                DATE: 05/08/2022 Aquatic therapy at [address] - therapeutic pool temp 92 degrees Pt  enters building independently.  Treatment took place in water 3.8 to  4 ft 8 in.feet deep depending upon activity.  Pt entered and exited the pool via stair and handrails independently.   Pt pain level 3/10 at initiation of water walking.  Therapeutic Exercise: Walking forward/backwards/side stepping with yellow DB at sides Lunge walk forward with yellow DB at sides x 2 laps  Side step squat with yellow DB shoulder abd/add x 2 laps Runners stretch x30" BIL Hamstring stretch x30" BIL Figure 4 squat stretch, BIL UE support x30" BIL Standing thoracic rotation with kickboard x1' Cat/cow with noodle x15 Hip flexor stretch with noodle x1' BIL Warrior I with noodle lift x10 BIL Tree pose x30" BIL At edge of pool, pt performed LE exercise: Hip abd/add x20 BIL Hip ext/flex with knee straight x 20 BIL Marching hip flexion to knee extension x20 BIL Squats 2x20 Heel/toe raises x20  Sitting on noodle with yellow dumbbells at sides Bicycle kicks x1' Scissor kicks x1'  Flutter kicks x1'  Pt requires the buoyancy of water for active assisted exercises  with buoyancy supported for strengthening and AROM exercises. Hydrostatic pressure also supports joints by unweighting joint load by at least 50 % in 3-4 feet depth water. 80% in chest to neck deep water. Water will provide assistance with movement using the current and laminar flow while the buoyancy reduces weight bearing. Pt requires the viscosity of the water for resistance with strengthening exercises.  [facility] Adult PT Treatment:                                                DATE: 05/01/2022 Aquatic therapy at [address] - therapeutic pool temp 92 degrees Pt enters building ambulating in CAM boot but independently.  Treatment took place in water 3.8 to  4 ft 8 in.feet deep depending upon activity.  Pt entered and exited the pool via stair and handrails independently.   Pt pain level 10/10 at initiation of water  walking.  Therapeutic Exercise: Walking forward/backwards/side stepping (side stepping leading with R painful) Figure 4 squat stretch, BIL UE support 2x30" BIL Floating on back with neck doodle Ai chi postures 1-8 for pain management, diaphragmatic breathing through postures  At edge of pool, pt performed LE exercise: Hip abd/add x20 BIL Hip ext/flex with knee straight x 20 BIL Marching hip flexion to knee extension x10 BIL Squats 2x20 Sitting on noodle with yellow dumbbells at sides Bicycle kicks x1' Flutter kicks x1' Cross country skiers x1'  Pt requires the buoyancy of water for active assisted exercises with buoyancy supported for strengthening and AROM exercises. Hydrostatic pressure also supports joints by unweighting joint load by at least 50 % in 3-4 feet depth water. 80% in chest to neck deep water. Water will provide assistance with movement using the current and laminar flow while the buoyancy reduces weight bearing. Pt requires the viscosity of the water for resistance with strengthening exercises.   [facility] Adult PT Treatment:                                                DATE: 04/29/2022 Therapeutic Exercise: nu-step L5 53m while taking subjective and planning session with patient Hip flexor stretch Piriformis stretch RF stretch LTR - 20x  Hip adduction ball squeeze - 5'' hold - 2x10 Alternating clamshell with GTB - 2x10 Alternating SLR from foam roller - 2x10 Isometric ball squeeze in hooklying - 5'' - 2x10   [facility] Adult PT Treatment:                                                DATE: 04/25/2022 Therapeutic Exercise: Supine 90/90 abdominal isometric with handhold resistance 3x30sec Side knee plank 2x30sec BIL Prone swimmers 3x20 Manual Therapy: N/A Neuromuscular re-ed: N/A Therapeutic Activity: Re-assessment of objective measures with pt education Re-administration of ODI with pt education Modalities: N/A Self Care: N/A    PATIENT EDUCATION:  POC,  diagnosis, prognosis, HEP, and outcome measures.  Pt educated via explanation, demonstration, and handout (HEP).  Pt confirms understanding verbally.    HOME EXERCISE PROGRAM: Access Code: [email or web address] URL: [email or web address] Date: 04/01/2022 Prepared by: Shearon Balo   Exercises -  Hip Flexor Stretch at Edge of Bed  - 2 x daily - 7 x weekly - 2 sets - 45 second hold - Supine Posterior Pelvic Tilt  - 2 x daily - 7 x weekly - 2 sets - 10 reps - 5'' hold - Supine Hip Adduction Isometric with Ball  - 1 x daily - 7 x weekly - 1 sets - 10 reps - 10'' hold - Supine Bridge  - 1 x daily - 7 x weekly - 2 sets - 10 reps - 5 second hold hold  Added 04/15/2022: - Marching Bridge  - 1 x daily - 7 x weekly - 3 sets - 20 reps - Prone Swimmer  - 1 x daily - 7 x weekly - 3 sets - 20 reps - Plank on Knees  - 1 x daily - 7 x weekly - 3 sets - to failure hold   ASTERISK SIGNS     Asterisk Signs Eval (04/01/2022)           piriformis Sig restriction            Hip flexors Sig restriction            bridge 19''            Plank from knees 27''            R hip ext and abd MMT 3+                ASSESSMENT:   CLINICAL IMPRESSION: Patient presents to aquatic session with decreased pain, reporting 3/10 at beginning of session. Session today focused on general conditioning and strengthening in the aquatic environment for use of buoyancy to offload joints and the viscosity of water as resistance during therapeutic exercise. We were able to complete more exercises and repetitions this session. Patient was able to tolerate all prescribed exercises in the aquatic environment with no adverse effects and reports 3/10 pain at the end of the session. Patient continues to benefit from skilled PT services on land and aquatic based and should be progressed as able to improve functional independence. Patient continues to benefit from skilled PT services and should be progressed as able to improve functional  independence.    OBJECTIVE IMPAIRMENTS: Pain, core strength, hip and LE strength   ACTIVITY LIMITATIONS: lifting, walking, standing, child care, housework   PERSONAL FACTORS: See medical history and pertinent history       GOALS:     SHORT TERM GOALS: Target date: 04/22/2022   Merrily will be >75% HEP compliant to improve carryover between sessions and facilitate independent management of condition   Evaluation (04/01/2022): ongoing 04/25/2022: Pt reports adherence to her HEP Goal status: ACHIEVED     LONG TERM GOALS: Target date: 05/27/2022   Maddalyn will show a >/= 12 pt improvement in their ODI score (MCID is 12 pts) as a proxy for functional improvement    Evaluation/Baseline (04/01/2022): 58%  04/25/2022: 58% Goal status: PROGRESSING     2.  Soliyana will be able to maintain supine bridge for 60'' (dominant leg extended if 120'' reached) as evidence of improved hip extension and core strength (norm for healthy adult ~170'')    Evaluation/Baseline (04/01/2022): 19'' 04/25/2022: 35" Goal status: PROGRESSING     3.  Jase will be able to maintain plank for 60'' from knees (norm for healthy adult is ~70'' from feet)    Evaluation/Baseline (04/01/2022): 27'' from knees 04/25/2022: 20 seconds from feet Goal status: PROGRESSING  4.  Jannatul will be able to complete housework including laundry, not limited by pain   Evaluation/Baseline (04/01/2022): limited Goal status: INITIAL     5.  Kennisha will self report >/= 50% decrease in pain from evaluation    Evaluation/Baseline (04/01/2022): 9/10 max pain Goal status: INITIAL     PLAN: PT FREQUENCY: 1-2x/week   PT DURATION: 8 weeks (Ending 05/27/2022)   PLANNED INTERVENTIONS: Therapeutic exercises, Aquatic therapy, Therapeutic activity, Neuro Muscular re-education, Gait training, Patient/Family education, Joint mobilization, Dry Needling, Electrical stimulation, Spinal mobilization and/or manipulation, Moist heat, Taping,  Vasopneumatic device, Ionotophoresis 4mg /ml Dexamethasone, and Manual therapy   PLAN FOR NEXT SESSION: progressive core and hip strength, hip flexor/piriformis stretching     Evelene Croon PTA 05/08/22 12:57 PM

## 2022-05-08 ENCOUNTER — Ambulatory Visit: Payer: Medicaid Other

## 2022-05-08 DIAGNOSIS — M545 Low back pain, unspecified: Secondary | ICD-10-CM

## 2022-05-08 DIAGNOSIS — M542 Cervicalgia: Secondary | ICD-10-CM

## 2022-05-08 DIAGNOSIS — M6281 Muscle weakness (generalized): Secondary | ICD-10-CM

## 2022-05-13 ENCOUNTER — Ambulatory Visit: Payer: Medicaid Other | Admitting: Physical Therapy

## 2022-05-14 DIAGNOSIS — F411 Generalized anxiety disorder: Secondary | ICD-10-CM | POA: Diagnosis not present

## 2022-05-14 DIAGNOSIS — F330 Major depressive disorder, recurrent, mild: Secondary | ICD-10-CM | POA: Diagnosis not present

## 2022-05-14 DIAGNOSIS — F4312 Post-traumatic stress disorder, chronic: Secondary | ICD-10-CM | POA: Diagnosis not present

## 2022-05-14 NOTE — Therapy (Signed)
OUTPATIENT PHYSICAL THERAPY TREATMENT NOTE/ RE-AUTHORIZATION   Patient Name: Kama Braegger MRN: [id number] DOB:[birth year], 26 y.o., female Today's Date: 05/15/2022  PCP: Bo Merino, FNP REFERRING PROVIDER: Meeler, Sherren Kerns, FNP  END OF SESSION:   PT End of Session - 05/15/22 1319     Visit Number 10    Date for PT Re-Evaluation 05/27/22    Authorization Type [employer] (VL 27) - pending additional auth    Authorization Time Period 04/01/2022-06/01/2022    Authorization - Visit Number 10    Authorization - Number of Visits 11    PT Start Time [id number]    PT Stop Time 1400    PT Time Calculation (min) 45 min    Activity Tolerance Patient tolerated treatment well    Behavior During Therapy WFL for tasks assessed/performed               Past Medical History:  Diagnosis Date   Anemia    with periods   Ankle fracture    right   Anxiety    Asthma    Back pain    Bipolar 1 disorder (HCC)    Depression    Bipolar    Endometriosis    Fibromyalgia    GERD (gastroesophageal reflux disease)    Headache    MIGRAINES   IBS (irritable bowel syndrome)    Ovarian cyst    PCOS (polycystic ovarian syndrome)    Pneumonia    age 19   PONV (postoperative nausea and vomiting)    nausea after wisdom teeth extraction   Scoliosis    Vaginal Pap smear, abnormal    bx ok   Past Surgical History:  Procedure Laterality Date   ABDOMINAL HYSTERECTOMY     FRACTURE SURGERY     ankle surgery with impant    HYSTEROSCOPY WITH D & C N/A 09/22/2019   Procedure: DILATATION AND CURETTAGE /HYSTEROSCOPY;  Surgeon: Ward, Honor Loh, MD;  Location: [facility];  Service: Gynecology;  Laterality: N/A;   LYSIS OF ADHESION N/A 05/08/2021   Procedure: LYSIS OF ADHESION, EXCISION OF ENDOMETRIOSIS;  Surgeon: Benjaman Kindler, MD;  Location: [facility];  Service: Gynecology;  Laterality: N/A;   ROBOTIC ASSISTED DIAGNOSTIC LAPAROSCOPY N/A 02/18/2017   Procedure: ROBOTIC ASSISTED DIAGNOSTIC  LAPAROSCOPY,EXCISION AND ABLATION OF ENDOMETRIOSIS,LYSIS OF ADHESIONS;  Surgeon: Brien Few, MD;  Location: [facility];  Service: Gynecology;  Laterality: N/A;   ROBOTIC ASSISTED LAPAROSCOPIC HYSTERECTOMY AND SALPINGECTOMY Bilateral 05/08/2021   Procedure: XI ROBOTIC ASSISTED TOTAL LAPAROSCOPIC HYSTERECTOMY AND SALPINGECTOMY;  Surgeon: Benjaman Kindler, MD;  Location: [facility];  Service: Gynecology;  Laterality: Bilateral;   VAGINAL DELIVERY N/A 08/14/2020   Procedure: VAGINAL DELIVERY;  Surgeon: Ouida Sills Gwen Her, MD;  Location: [facility];  Service: Obstetrics;  Laterality: N/A;   [place] EXTRACTION  2013   Patient Active Problem List   Diagnosis Date Noted   Fibromyalgia syndrome 03/09/2022   Chronic back pain 03/09/2022   Excessive daytime sleepiness 11/28/2021   Endometriosis determined by laparoscopy 05/08/2021   Myalgia 03/11/2021   Elevated BP without diagnosis of hypertension 01/08/2021   Dichorionic diamniotic twin pregnancy in third trimester 08/14/2020   Preeclampsia 08/14/2020   Twin pregnancy delivered vaginally 08/14/2020   Preterm labor 06/17/2020   Right trigeminal neuralgia 11/08/2019   Endometriosis 03/27/2019   PCOS (polycystic ovarian syndrome) 03/27/2019   Moderate persistent asthma 03/07/2019   Post-nasal drainage 03/07/2019   Irritable bowel syndrome with diarrhea 03/07/2019   Gastroesophageal reflux disease without esophagitis 03/07/2019  Class 2 obesity due to excess calories without serious comorbidity with body mass index (BMI) of 38.0 to 38.9 in adult 09/02/2018   Bipolar 2 disorder ([place]) 08/12/2018   History of low birth weight 03/11/2018   Vulvar atrophy 03/11/2018   History of migraine headaches 05/01/2015   Anxiety and depression 05/01/2015    REFERRING DIAG: Dorsalgia, unspecified [M54.9], Sciatica, unspecified side [M54.30]  THERAPY DIAG:  Low back pain, unspecified back pain laterality, unspecified chronicity, unspecified whether  sciatica present  Cervicalgia  Muscle weakness  Rationale for Evaluation and Treatment Rehabilitation  PERTINENT HISTORY: Bipolar, fibromyalgia, headaches, scoliosis, asthma, hysterectomy, anxiety, depression, PTSD   PRECAUTIONS: None  SUBJECTIVE: Patient reports that she is feeling ok today, her pain has been less recently.  PAIN:  Are you having pain? Yes Pain location: low back pain NPRS scale:  Current 4/10  Aggravating factors: bending and lifting, standing (1/2 hour), sitting (1 hour)           NPRS, highest: 9/10 Relieving factors: changing positions, ice, heat, medication, rest           NPRS: best: 2/10 Pain description: intermittent, constant, burning, stabbing, and "nerve" Stage: Chronic Stability: staying the same 24 hour pattern: better in the morning, then increasing throughout the day.    OBJECTIVE: (objective measures completed at initial evaluation unless otherwise dated)  DIAGNOSTIC FINDINGS:  None current   GENERAL OBSERVATION/GAIT:           Increased lumbar lordosis, R sided rib elevation in lumbar flexion, increased lumbar lordosis,   SENSATION:          Light touch: Appears intact   MUSCLE LENGTH: Hamstrings: Right no restriction; Left no restriction ASLR: Right ASLR = PSLR; Left ASLR = PSLR Krupinski test: Right significant restriction; Left significant restriction Ely's test: Right significant restriction; Left significant restriction                         Tight piriformis bil       LUMBAR AROM   AROM AROM  04/01/2022 AROM 04/25/2022  Flexion WNL WNL  Extension WNL, w/ concordant pain WNL, w/ concordant pain  Right lateral flexion WNL, w/ concordant pain WNL, minor pain  Left lateral flexion WNL, w/ concordant pain WNL, minor pain  Right rotation limited by 25%, w/ concordant pain WNL  Left rotation limited by 25%, w/ no pain limited by 25%, w/ no pain    (Blank rows = not tested)   DIRECTIONAL PREFERENCE:           none   LE  MMT:   MMT Right 04/01/2022 Left 04/01/2022 Right 04/25/2022 Left 04/25/2022  Hip flexion (L2, L3) 4 4 5/5 5/5  Knee extension (L3) 4 4    Knee flexion 4 4    Hip abduction 3+ 4 5/5 4+/5  Hip extension 3+ 4 4/5 5/5  Hip external rotation        Hip internal rotation        Hip adduction        Ankle dorsiflexion (L4)        Ankle plantarflexion (S1)        Ankle inversion        Ankle eversion        Great Toe ext (L5)        Grossly          (Blank rows = not tested, score listed is out of 5 possible points.  N = WNL, D = diminished, C = clear for gross weakness with myotome testing, * = concordant pain with testing)     LE ROM:   ROM Right 04/01/2022 Left 04/01/2022  Hip flexion 90 90  Hip extension limited limited  Hip abduction      Hip adduction      Hip internal rotation limited limited  Hip external rotation      Knee flexion      Knee extension      Ankle dorsiflexion      Ankle plantarflexion      Ankle inversion      Ankle eversion         (Blank rows = not tested, N = WNL, * = concordant pain with testing)   Functional Tests   Eval (04/01/2022)  04/25/2022    Sustained supine bridge (dominant leg extended at 120'', if reached): 19'' (norm 170'')    35"    Standard plank from knees: 27'' (norm 70'' from feet)  20" from feet                                                                                             LUMBAR SPECIAL TESTS:  Straight leg raise: L (-), R (-) Slump: L (-), R (-) SI: fortin's sign, compression, sacral thrust (+)    PALPATION:            TTP bil PSIS, sacrum, TTP lumbar paraspinals L1-L5    SPINAL SEGMENTAL MOBILITY ASSESSMENT:  Painful, increase lordosis lumbar spine   PATIENT SURVEYS:  Modified Oswestry 29->58% disability  04/25/2022: 29 ->58%     TODAY'S TREATMENT  [facility] Adult PT Treatment:                                                DATE: 05/15/2022 Aquatic therapy at [facility]- [address] -  therapeutic pool temp 92 degrees Pt enters building independently.  Treatment took place in water 3.8 to  4 ft 8 in.feet deep depending upon activity.  Pt entered and exited the pool via stair and handrails independently.   Pt pain level 4/10 at initiation of water walking.  Therapeutic Exercise: Walking forward/backwards/side stepping with yellow DB at sides Lunge walk forward with yellow DB at sides x 2 laps  Side step squat with yellow DB shoulder abd/add x 2 laps Runners stretch x30" BIL Hamstring stretch x30" BIL Figure 4 squat stretch, BIL UE support 2x30" BIL Standing thoracic rotation with noodle x1' Cat/cow with noodle x20 Hip flexor stretch with noodle x1' BIL Warrior I with noodle lift x10 BIL At edge of pool, pt performed LE exercise: Hip abd/add x20 BIL Hip ext/flex with knee straight x 20 BIL Marching hip flexion to knee extension x20 BIL Squats 2x20 Heel/toe raises x20  Sitting on noodle with yellow dumbbells at sides Bicycle kicks x1' Scissor kicks x1'  Flutter kicks x1'  Pt requires the buoyancy of water for active assisted exercises with buoyancy  supported for strengthening and AROM exercises. Hydrostatic pressure also supports joints by unweighting joint load by at least 50 % in 3-4 feet depth water. 80% in chest to neck deep water. Water will provide assistance with movement using the current and laminar flow while the buoyancy reduces weight bearing. Pt requires the viscosity of the water for resistance with strengthening exercises.  [facility] Adult PT Treatment:                                                DATE: 05/08/2022 Aquatic therapy at [address] - therapeutic pool temp 92 degrees Pt enters building independently.  Treatment took place in water 3.8 to  4 ft 8 in.feet deep depending upon activity.  Pt entered and exited the pool via stair and handrails independently.   Pt pain level 3/10 at initiation of water walking.  Therapeutic  Exercise: Walking forward/backwards/side stepping with yellow DB at sides Lunge walk forward with yellow DB at sides x 2 laps  Side step squat with yellow DB shoulder abd/add x 2 laps Runners stretch x30" BIL Hamstring stretch x30" BIL Figure 4 squat stretch, BIL UE support x30" BIL Standing thoracic rotation with kickboard x1' Cat/cow with noodle x15 Hip flexor stretch with noodle x1' BIL Warrior I with noodle lift x10 BIL Tree pose x30" BIL At edge of pool, pt performed LE exercise: Hip abd/add x20 BIL Hip ext/flex with knee straight x 20 BIL Marching hip flexion to knee extension x20 BIL Squats 2x20 Heel/toe raises x20  Sitting on noodle with yellow dumbbells at sides Bicycle kicks x1' Scissor kicks x1'  Flutter kicks x1'  Pt requires the buoyancy of water for active assisted exercises with buoyancy supported for strengthening and AROM exercises. Hydrostatic pressure also supports joints by unweighting joint load by at least 50 % in 3-4 feet depth water. 80% in chest to neck deep water. Water will provide assistance with movement using the current and laminar flow while the buoyancy reduces weight bearing. Pt requires the viscosity of the water for resistance with strengthening exercises.  [facility] Inpatient Adult PT Treatment:                                                DATE: 05/01/2022 Aquatic therapy at [address] - therapeutic pool temp 92 degrees Pt enters building ambulating in CAM boot but independently.  Treatment took place in water 3.8 to  4 ft 8 in.feet deep depending upon activity.  Pt entered and exited the pool via stair and handrails independently.   Pt pain level 10/10 at initiation of water walking.  Therapeutic Exercise: Walking forward/backwards/side stepping (side stepping leading with R painful) Figure 4 squat stretch, BIL UE support 2x30" BIL Floating on back with neck doodle Ai chi postures 1-8 for pain management, diaphragmatic breathing through  postures  At edge of pool, pt performed LE exercise: Hip abd/add x20 BIL Hip ext/flex with knee straight x 20 BIL Marching hip flexion to knee extension x10 BIL Squats 2x20 Sitting on noodle with yellow dumbbells at sides Bicycle kicks x1' Flutter kicks x1' Cross country skiers x1'  Pt requires the buoyancy of water for active assisted exercises with buoyancy supported for strengthening  and AROM exercises. Hydrostatic pressure also supports joints by unweighting joint load by at least 50 % in 3-4 feet depth water. 80% in chest to neck deep water. Water will provide assistance with movement using the current and laminar flow while the buoyancy reduces weight bearing. Pt requires the viscosity of the water for resistance with strengthening exercises.   PATIENT EDUCATION:  POC, diagnosis, prognosis, HEP, and outcome measures.  Pt educated via explanation, demonstration, and handout (HEP).  Pt confirms understanding verbally.    HOME EXERCISE PROGRAM: Access Code: [email or web address] URL: [email or web address] Date: 04/01/2022 Prepared by: Alphonzo Severance   Exercises - Hip Flexor Stretch at [facility] of Bed  - 2 x daily - 7 x weekly - 2 sets - 45 second hold - Supine Posterior Pelvic Tilt  - 2 x daily - 7 x weekly - 2 sets - 10 reps - 5'' hold - Supine Hip Adduction Isometric with Ball  - 1 x daily - 7 x weekly - 1 sets - 10 reps - 10'' hold - Supine Bridge  - 1 x daily - 7 x weekly - 2 sets - 10 reps - 5 second hold hold  Added 04/15/2022: - Marching Bridge  - 1 x daily - 7 x weekly - 3 sets - 20 reps - Prone Swimmer  - 1 x daily - 7 x weekly - 3 sets - 20 reps - Plank on Knees  - 1 x daily - 7 x weekly - 3 sets - to failure hold   ASTERISK SIGNS     Asterisk Signs Eval (04/01/2022)           piriformis Sig restriction            Hip flexors Sig restriction            bridge 19''            Plank from knees 27''            R hip ext and abd MMT 3+                ASSESSMENT:    CLINICAL IMPRESSION: Patient presents to aquatic session with mild to moderate pain in her lower back, reporting 4/10 at beginning of session. She reports that she remains occasionally limited by pain in her daily life, particularly as it relates to picking up her kids. Session today focused on general conditioning and core strengthening in the aquatic environment for use of buoyancy to offload joints and the viscosity of water as resistance during therapeutic exercise. Patient was able to tolerate all prescribed exercises in the aquatic environment with no adverse effects and reports 2/10 pain at the end of the session. Patient continues to benefit from skilled PT services on land and aquatic based and should be progressed as able to improve functional independence.   OBJECTIVE IMPAIRMENTS: Pain, core strength, hip and LE strength   ACTIVITY LIMITATIONS: lifting, walking, standing, child care, housework   PERSONAL FACTORS: See medical history and pertinent history       GOALS:     SHORT TERM GOALS: Target date: 04/22/2022   Genika will be >75% HEP compliant to improve carryover between sessions and facilitate independent management of condition   Evaluation (04/01/2022): ongoing 04/25/2022: Pt reports adherence to her HEP Goal status: ACHIEVED     LONG TERM GOALS: Target date: 05/27/2022   Catelyn will show a >/= 12 pt improvement in their ODI score (MCID is 12 pts) as a proxy  for functional improvement    Evaluation/Baseline (04/01/2022): 58%  04/25/2022: 58% Goal status: PROGRESSING     2.  Maryln will be able to maintain supine bridge for 60'' (dominant leg extended if 120'' reached) as evidence of improved hip extension and core strength (norm for healthy adult ~170'')    Evaluation/Baseline (04/01/2022): 19'' 04/25/2022: 35" Goal status: PROGRESSING     3.  Brexley will be able to maintain plank for 60'' from knees (norm for healthy adult is ~70'' from feet)    Evaluation/Baseline  (04/01/2022): 27'' from knees 04/25/2022: 20 seconds from feet Goal status: PROGRESSING     4.  Cindee will be able to complete housework including laundry, not limited by pain   Evaluation/Baseline (04/01/2022): limited Goal status: INITIAL     5.  Jillana will self report >/= 50% decrease in pain from evaluation    Evaluation/Baseline (04/01/2022): 9/10 max pain Goal status: INITIAL     PLAN: PT FREQUENCY: 1-2x/week   PT DURATION: 8 weeks (Ending 05/27/2022)   PLANNED INTERVENTIONS: Therapeutic exercises, Aquatic therapy, Therapeutic activity, Neuro Muscular re-education, Gait training, Patient/Family education, Joint mobilization, Dry Needling, Electrical stimulation, Spinal mobilization and/or manipulation, Moist heat, Taping, Vasopneumatic device, Ionotophoresis 4mg /ml Dexamethasone, and Manual therapy   PLAN FOR NEXT SESSION: progressive core and hip strength, hip flexor/piriformis stretching     PTA 05/15/22 2:10 PM

## 2022-05-15 ENCOUNTER — Ambulatory Visit: Payer: Medicaid Other

## 2022-05-15 DIAGNOSIS — M545 Low back pain, unspecified: Secondary | ICD-10-CM | POA: Diagnosis not present

## 2022-05-15 DIAGNOSIS — M6281 Muscle weakness (generalized): Secondary | ICD-10-CM | POA: Diagnosis not present

## 2022-05-15 DIAGNOSIS — M542 Cervicalgia: Secondary | ICD-10-CM

## 2022-05-19 DIAGNOSIS — Z419 Encounter for procedure for purposes other than remedying health state, unspecified: Secondary | ICD-10-CM | POA: Diagnosis not present

## 2022-05-20 ENCOUNTER — Ambulatory Visit: Payer: Medicaid Other | Attending: Neurology | Admitting: Physical Therapy

## 2022-05-20 ENCOUNTER — Other Ambulatory Visit: Payer: Self-pay | Admitting: Family Medicine

## 2022-05-20 DIAGNOSIS — M5416 Radiculopathy, lumbar region: Secondary | ICD-10-CM

## 2022-05-20 DIAGNOSIS — M545 Low back pain, unspecified: Secondary | ICD-10-CM | POA: Insufficient documentation

## 2022-05-20 DIAGNOSIS — M6281 Muscle weakness (generalized): Secondary | ICD-10-CM | POA: Insufficient documentation

## 2022-05-20 DIAGNOSIS — M542 Cervicalgia: Secondary | ICD-10-CM | POA: Insufficient documentation

## 2022-05-21 DIAGNOSIS — F330 Major depressive disorder, recurrent, mild: Secondary | ICD-10-CM | POA: Diagnosis not present

## 2022-05-21 DIAGNOSIS — F411 Generalized anxiety disorder: Secondary | ICD-10-CM | POA: Diagnosis not present

## 2022-05-22 ENCOUNTER — Ambulatory Visit: Payer: Medicaid Other

## 2022-05-24 ENCOUNTER — Ambulatory Visit
Admission: RE | Admit: 2022-05-24 | Discharge: 2022-05-24 | Disposition: A | Discharge status: Home / Self Care | Payer: Medicaid Other | Source: Ambulatory Visit | Attending: Family Medicine | Admitting: Family Medicine

## 2022-05-24 DIAGNOSIS — M545 Low back pain, unspecified: Secondary | ICD-10-CM | POA: Diagnosis not present

## 2022-05-24 DIAGNOSIS — M5416 Radiculopathy, lumbar region: Secondary | ICD-10-CM

## 2022-05-25 ENCOUNTER — Ambulatory Visit: Payer: Medicaid Other | Admitting: Licensed Clinical Social Worker

## 2022-05-25 DIAGNOSIS — F411 Generalized anxiety disorder: Secondary | ICD-10-CM

## 2022-05-25 DIAGNOSIS — F3181 Bipolar II disorder: Secondary | ICD-10-CM

## 2022-05-25 NOTE — Progress Notes (Signed)
Counselor/Therapist Progress Note  [name], MRN: [id number],    Date: 05/25/2022  Time Spent: 49 minutes    Treatment Type: Psychotherapy  Reported Symptoms:  overall stable mood; Anxiety, anxious thoughts  Mental Status Exam:  Appearance:   Casual     Behavior:  Appropriate and Sharing  Motor:  Normal  Speech/Language:   Clear and Coherent and Normal Rate  Affect:  Appropriate, Congruent, and Full Range  Mood:  euthymic  Thought process:  normal  Thought content:    WNL  Sensory/Perceptual disturbances:    WNL  Orientation:  oriented to person, place, time/date, and situation  Attention:  Good  Concentration:  Good  Memory:  WNL  Fund of knowledge:   Good  Insight:    Good  Judgment:   Good  Impulse Control:  Good   Risk Assessment: Danger to Self:  No Self-injurious Behavior: No Danger to Others: No Duty to Warn:no Physical Aggression / Violence:No  Access to Firearms a concern: No  Gang Involvement:No   Subjective: Patient was engaged and cooperative throughout the session using time effectively to discuss thoughts and feelings and transition to new therapist.  Patient reports the transition to new therapist has gone well.  Interventions: Cognitive Behavioral Therapy Established psychological safety. Checked in with patient regarding her week. Reviewed previous session regarding transition to new therapist. Discussed termination of services. Provided support through active listening, validation of feelings, and highlighted patient's strengths.    Diagnosis:   ICD-10-CM   1. Generalized anxiety disorder  F41.1     2. Bipolar 2 disorder [facility])  F31.81      Plan: Patient has transitioned her care to a new therapist.   Future Appointments  Date Time Provider Department Center  05/27/2022  6:30 PM Zadie Rhine, PT [facility]  05/29/2022  1:15 PM Harland German, PTA [facility]  06/03/2022  6:30 PM Zadie Rhine, PT  [facility]  06/10/2022  6:30 PM Zadie Rhine, PT [facility]  06/12/2022 12:30 PM Harland German, PTA [facility]    Kathreen Cosier, [place]

## 2022-05-27 ENCOUNTER — Ambulatory Visit: Payer: Medicaid Other | Admitting: Physical Therapy

## 2022-05-27 ENCOUNTER — Encounter: Payer: Self-pay | Admitting: Physical Therapy

## 2022-05-27 DIAGNOSIS — M722 Plantar fascial fibromatosis: Secondary | ICD-10-CM | POA: Diagnosis not present

## 2022-05-27 DIAGNOSIS — M6281 Muscle weakness (generalized): Secondary | ICD-10-CM

## 2022-05-27 DIAGNOSIS — M25372 Other instability, left ankle: Secondary | ICD-10-CM | POA: Diagnosis not present

## 2022-05-27 DIAGNOSIS — M545 Low back pain, unspecified: Secondary | ICD-10-CM | POA: Diagnosis not present

## 2022-05-27 DIAGNOSIS — M542 Cervicalgia: Secondary | ICD-10-CM

## 2022-05-27 DIAGNOSIS — S93492A Sprain of other ligament of left ankle, initial encounter: Secondary | ICD-10-CM | POA: Diagnosis not present

## 2022-05-27 NOTE — Therapy (Signed)
OUTPATIENT PHYSICAL THERAPY TREATMENT NOTE/ RE-AUTHORIZATION   Patient Name: Janet Rich MRN: [id number] DOB:05-Jan-1996, 26 y.o., female Today's Date: 05/27/2022  PCP: Bo Merino, FNP REFERRING PROVIDER: Meeler, Sherren Kerns, FNP  END OF SESSION:   PT End of Session - 05/27/22 1826     Visit Number 11    Date for PT Re-Evaluation 07/22/22    Authorization Type [employer] (VL 27) - pending additional auth    Authorization Time Period 04/01/2022-06/01/2022    Authorization - Visit Number 11    Authorization - Number of Visits 11    PT Start Time [id number]    PT Stop Time 1905    PT Time Calculation (min) 40 min    Activity Tolerance Patient tolerated treatment well    Behavior During Therapy WFL for tasks assessed/performed                Past Medical History:  Diagnosis Date   Anemia    with periods   Ankle fracture    right   Anxiety    Asthma    Back pain    Bipolar 1 disorder (HCC)    Depression    Bipolar    Endometriosis    Fibromyalgia    GERD (gastroesophageal reflux disease)    Headache    MIGRAINES   IBS (irritable bowel syndrome)    Ovarian cyst    PCOS (polycystic ovarian syndrome)    Pneumonia    age 71   PONV (postoperative nausea and vomiting)    nausea after wisdom teeth extraction   Scoliosis    Vaginal Pap smear, abnormal    bx ok   Past Surgical History:  Procedure Laterality Date   ABDOMINAL HYSTERECTOMY     FRACTURE SURGERY     ankle surgery with impant    HYSTEROSCOPY WITH D & C N/A 09/22/2019   Procedure: DILATATION AND CURETTAGE /HYSTEROSCOPY;  Surgeon: Ward, Honor Loh, MD;  Location: [facility];  Service: Gynecology;  Laterality: N/A;   LYSIS OF ADHESION N/A 05/08/2021   Procedure: LYSIS OF ADHESION, EXCISION OF ENDOMETRIOSIS;  Surgeon: Benjaman Kindler, MD;  Location: [facility];  Service: Gynecology;  Laterality: N/A;   ROBOTIC ASSISTED DIAGNOSTIC LAPAROSCOPY N/A 02/18/2017   Procedure: ROBOTIC ASSISTED DIAGNOSTIC  LAPAROSCOPY,EXCISION AND ABLATION OF ENDOMETRIOSIS,LYSIS OF ADHESIONS;  Surgeon: Brien Few, MD;  Location: [facility];  Service: Gynecology;  Laterality: N/A;   ROBOTIC ASSISTED LAPAROSCOPIC HYSTERECTOMY AND SALPINGECTOMY Bilateral 05/08/2021   Procedure: XI ROBOTIC ASSISTED TOTAL LAPAROSCOPIC HYSTERECTOMY AND SALPINGECTOMY;  Surgeon: Benjaman Kindler, MD;  Location: [facility];  Service: Gynecology;  Laterality: Bilateral;   VAGINAL DELIVERY N/A 08/14/2020   Procedure: VAGINAL DELIVERY;  Surgeon: Ouida Sills Gwen Her, MD;  Location: [facility];  Service: Obstetrics;  Laterality: N/A;   [place] EXTRACTION  2013   Patient Active Problem List   Diagnosis Date Noted   Fibromyalgia syndrome 03/09/2022   Chronic back pain 03/09/2022   Excessive daytime sleepiness 11/28/2021   Endometriosis determined by laparoscopy 05/08/2021   Myalgia 03/11/2021   Elevated BP without diagnosis of hypertension 01/08/2021   Dichorionic diamniotic twin pregnancy in third trimester 08/14/2020   Preeclampsia 08/14/2020   Twin pregnancy delivered vaginally 08/14/2020   Preterm labor 06/17/2020   Right trigeminal neuralgia 11/08/2019   Endometriosis 03/27/2019   PCOS (polycystic ovarian syndrome) 03/27/2019   Moderate persistent asthma 03/07/2019   Post-nasal drainage 03/07/2019   Irritable bowel syndrome with diarrhea 03/07/2019   Gastroesophageal reflux disease without esophagitis 03/07/2019  Class 2 obesity due to excess calories without serious comorbidity with body mass index (BMI) of 38.0 to 38.9 in adult 09/02/2018   Bipolar 2 disorder ([place]) 08/12/2018   History of low birth weight 03/11/2018   Vulvar atrophy 03/11/2018   History of migraine headaches 05/01/2015   Anxiety and depression 05/01/2015    REFERRING DIAG: Dorsalgia, unspecified [M54.9], Sciatica, unspecified side [M54.30]  THERAPY DIAG:  Low back pain, unspecified back pain laterality, unspecified chronicity, unspecified whether  sciatica present - Plan: PT plan of care cert/re-cert  Cervicalgia - Plan: PT plan of care cert/re-cert  Muscle weakness - Plan: PT plan of care cert/re-cert  Rationale for Evaluation and Treatment Rehabilitation  PERTINENT HISTORY: Bipolar, fibromyalgia, headaches, scoliosis, asthma, hysterectomy, anxiety, depression, PTSD   PRECAUTIONS: None  SUBJECTIVE:   Pt reports that her pain is slowly improving.  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  She is still limited in heavier household chores like unloading the dishwasher.   PAIN:  Are you having pain? Yes Pain location: low back pain NPRS scale:  Current 4/10  Aggravating factors: bending and lifting, standing (1/2 hour), sitting (1 hour)           NPRS, highest: 7/10 Relieving factors: changing positions, ice, heat, medication, rest Pain description: intermittent, constant, burning, stabbing, and "nerve" Stage: Chronic Stability: staying the same 24 hour pattern: better in the morning, then increasing throughout the day.    OBJECTIVE: (objective measures completed at initial evaluation unless otherwise dated)  DIAGNOSTIC FINDINGS:  None current   GENERAL OBSERVATION/GAIT:           Increased lumbar lordosis, R sided rib elevation in lumbar flexion, increased lumbar lordosis,   SENSATION:          Light touch: Appears intact   MUSCLE LENGTH: Hamstrings: Right no restriction; Left no restriction ASLR: Right ASLR = PSLR; Left ASLR = PSLR Amescua test: Right significant restriction; Left significant restriction Ely's test: Right significant restriction; Left significant restriction                         Tight piriformis bil       LUMBAR AROM   AROM AROM  04/01/2022 AROM 04/25/2022  Flexion WNL WNL  Extension WNL, w/ concordant pain WNL, w/ concordant pain  Right lateral flexion WNL, w/ concordant pain WNL, minor pain  Left lateral flexion WNL, w/ concordant pain WNL, minor pain   Right rotation limited by 25%, w/ concordant pain WNL  Left rotation limited by 25%, w/ no pain limited by 25%, w/ no pain    (Blank rows = not tested)   DIRECTIONAL PREFERENCE:           none   LE MMT:   MMT Right 04/01/2022 Left 04/01/2022 Right 04/25/2022 Left 04/25/2022  Hip flexion (L2, L3) 4 4 5/5 5/5  Knee extension (L3) 4 4    Knee flexion 4 4    Hip abduction 3+ 4 5/5 4+/5  Hip extension 3+ 4 4/5 5/5  Hip external rotation        Hip internal rotation        Hip adduction        Ankle dorsiflexion (L4)        Ankle plantarflexion (S1)        Ankle inversion        Ankle eversion        [employer]  Toe ext (L5)        Grossly          (Blank rows = not tested, score listed is out of 5 possible points.  N = WNL, D = diminished, C = clear for gross weakness with myotome testing, * = concordant pain with testing)     LE ROM:   ROM Right 04/01/2022 Left 04/01/2022  Hip flexion 90 90  Hip extension limited limited  Hip abduction      Hip adduction      Hip internal rotation limited limited  Hip external rotation      Knee flexion      Knee extension      Ankle dorsiflexion      Ankle plantarflexion      Ankle inversion      Ankle eversion         (Blank rows = not tested, N = WNL, * = concordant pain with testing)   Functional Tests   Eval (04/01/2022)  04/25/2022    Sustained supine bridge (dominant leg extended at 120'', if reached): 19'' (norm 170'')    35"    Standard plank from knees: 27'' (norm 70'' from feet)  20" from feet                                                                                             LUMBAR SPECIAL TESTS:  Straight leg raise: L (-), R (-) Slump: L (-), R (-) SI: fortin's sign, compression, sacral thrust (+)    PALPATION:            TTP bil PSIS, sacrum, TTP lumbar paraspinals L1-L5    SPINAL SEGMENTAL MOBILITY ASSESSMENT:  Painful, increase lordosis lumbar spine   PATIENT SURVEYS:  Modified Oswestry 29->58%  disability  04/25/2022: 29 ->58%   HOME EXERCISE PROGRAM: Access Code: [email or web address] URL: [email or web address] Date: 04/01/2022 Prepared by: Shearon Balo   Exercises - Hip Flexor Stretch at [facility] of Bed  - 2 x daily - 7 x weekly - 2 sets - 45 second hold - Supine Posterior Pelvic Tilt  - 2 x daily - 7 x weekly - 2 sets - 10 reps - 5'' hold - Supine Hip Adduction Isometric with Ball  - 1 x daily - 7 x weekly - 1 sets - 10 reps - 10'' hold - Supine Bridge  - 1 x daily - 7 x weekly - 2 sets - 10 reps - 5 second hold hold  Added 04/15/2022: - Marching Bridge  - 1 x daily - 7 x weekly - 3 sets - 20 reps - Prone Swimmer  - 1 x daily - 7 x weekly - 3 sets - 20 reps - Plank on Knees  - 1 x daily - 7 x weekly - 3 sets - to failure hold   ASTERISK SIGNS     Asterisk Signs Eval (04/01/2022) 8/9          piriformis Sig restriction            Hip flexors Sig restriction Moderate restriction  bridge 19'' 50''           Plank from knees 27''  43''          R hip ext and abd MMT 3+                  TODAY'S TREATMENT   DATE: 05/27/2022 Therapeutic Exercise: nu-step L5 43m while taking subjective and planning session with patient Hip flexor stretch Piriformis stretch RF stretch LTR - 20x  Bridge - 10x Hip adduction ball squeeze - 5'' hold - 2x10 Alternating clamshell with GTB - 2x10 Alternating SLR from foam roller - 2x10 Isometric ball squeeze in hooklying - 5'' - 2x10   Therapeutic Activity - collecting information for goals, checking progress, and reviewing with patient  [facility] Adult PT Treatment:                                                DATE: 05/15/2022 Aquatic therapy at [facility] [address] - therapeutic pool temp 92 degrees Pt enters building independently.  Treatment took place in water 3.8 to  4 ft 8 in.feet deep depending upon activity.  Pt entered and exited the pool via stair and handrails independently.   Pt pain level 4/10 at initiation of  water walking.  Therapeutic Exercise: Walking forward/backwards/side stepping with yellow DB at sides Lunge walk forward with yellow DB at sides x 2 laps  Side step squat with yellow DB shoulder abd/add x 2 laps Runners stretch x30" BIL Hamstring stretch x30" BIL Figure 4 squat stretch, BIL UE support 2x30" BIL Standing thoracic rotation with noodle x1' Cat/cow with noodle x20 Hip flexor stretch with noodle x1' BIL Warrior I with noodle lift x10 BIL At edge of pool, pt performed LE exercise: Hip abd/add x20 BIL Hip ext/flex with knee straight x 20 BIL Marching hip flexion to knee extension x20 BIL Squats 2x20 Heel/toe raises x20  Sitting on noodle with yellow dumbbells at sides Bicycle kicks x1' Scissor kicks x1'  Flutter kicks x1'  Pt requires the buoyancy of water for active assisted exercises with buoyancy supported for strengthening and AROM exercises. Hydrostatic pressure also supports joints by unweighting joint load by at least 50 % in 3-4 feet depth water. 80% in chest to neck deep water. Water will provide assistance with movement using the current and laminar flow while the buoyancy reduces weight bearing. Pt requires the viscosity of the water for resistance with strengthening exercises.  [facility] Adult PT Treatment:                                                DATE: 05/08/2022 Aquatic therapy at [facility] [address] - therapeutic pool temp 92 degrees Pt enters building independently.  Treatment took place in water 3.8 to  4 ft 8 in.feet deep depending upon activity.  Pt entered and exited the pool via stair and handrails independently.   Pt pain level 3/10 at initiation of water walking.  Therapeutic Exercise: Walking forward/backwards/side stepping with yellow DB at sides Lunge walk forward with yellow DB at sides x 2 laps  Side step squat with yellow DB shoulder abd/add x 2 laps Runners stretch x30" BIL Hamstring stretch x30" BIL Figure 4  squat stretch, BIL  UE support x30" BIL Standing thoracic rotation with kickboard x1' Cat/cow with noodle x15 Hip flexor stretch with noodle x1' BIL Warrior I with noodle lift x10 BIL Tree pose x30" BIL At edge of pool, pt performed LE exercise: Hip abd/add x20 BIL Hip ext/flex with knee straight x 20 BIL Marching hip flexion to knee extension x20 BIL Squats 2x20 Heel/toe raises x20  Sitting on noodle with yellow dumbbells at sides Bicycle kicks x1' Scissor kicks x1'  Flutter kicks x1'  Pt requires the buoyancy of water for active assisted exercises with buoyancy supported for strengthening and AROM exercises. Hydrostatic pressure also supports joints by unweighting joint load by at least 50 % in 3-4 feet depth water. 80% in chest to neck deep water. Water will provide assistance with movement using the current and laminar flow while the buoyancy reduces weight bearing. Pt requires the viscosity of the water for resistance with strengthening exercises.  [facility] Adult PT Treatment:                                                DATE: 05/01/2022 Aquatic therapy at [facility] [address] - therapeutic pool temp 92 degrees Pt enters building ambulating in CAM boot but independently.  Treatment took place in water 3.8 to  4 ft 8 in.feet deep depending upon activity.  Pt entered and exited the pool via stair and handrails independently.   Pt pain level 10/10 at initiation of water walking.  Therapeutic Exercise: Walking forward/backwards/side stepping (side stepping leading with R painful) Figure 4 squat stretch, BIL UE support 2x30" BIL Floating on back with neck doodle Ai chi postures 1-8 for pain management, diaphragmatic breathing through postures  At edge of pool, pt performed LE exercise: Hip abd/add x20 BIL Hip ext/flex with knee straight x 20 BIL Marching hip flexion to knee extension x10 BIL Squats 2x20 Sitting on noodle with yellow dumbbells at sides Bicycle kicks x1' Flutter kicks x1' Cross  country skiers x1'  Pt requires the buoyancy of water for active assisted exercises with buoyancy supported for strengthening and AROM exercises. Hydrostatic pressure also supports joints by unweighting joint load by at least 50 % in 3-4 feet depth water. 80% in chest to neck deep water. Water will provide assistance with movement using the current and laminar flow while the buoyancy reduces weight bearing. Pt requires the viscosity of the water for resistance with strengthening exercises.   PATIENT EDUCATION:  POC, diagnosis, prognosis, HEP, and outcome measures.  Pt educated via explanation, demonstration, and handout (HEP).  Pt confirms understanding verbally.     ASSESSMENT:   CLINICAL IMPRESSION: Analysa has progressed well with therapy.  Improved impairments include: pain level, core strength, LE strength, muscular endurance.  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  Progressions needed include: continued work on core and hip strength to allow completion of all ADLs including child care and bending tasks such as unloading the dishwasher.  She shows significant core strength/endurance markers with bridge and plank which is correlated to low back pain; these measure are improving with therapy..  Barriers to progress include: chronic condition.  Please see GOALS section for progress on short term and long term goals established at evaluation.  I recommend continuation of PT to allow completion of remaining goals and continued functional progression.   OBJECTIVE IMPAIRMENTS: Pain, core strength, hip and LE strength   ACTIVITY LIMITATIONS: lifting, walking, standing, child care, housework   PERSONAL FACTORS: See medical history and pertinent history       GOALS:     SHORT TERM GOALS: Target date: 04/22/2022   Maurine will be >75% HEP compliant to improve  carryover between sessions and facilitate independent management of condition   Evaluation (04/01/2022): ongoing 04/25/2022: Pt reports adherence to her HEP Goal status: ACHIEVED     LONG TERM GOALS: Target date: 07/22/2022 (extended)   Shannon will show a >/= 12 pt improvement in their ODI score (MCID is 12 pts) as a proxy for functional improvement    Evaluation/Baseline (04/01/2022): 58%  04/25/2022: 58% 8/9: 52.0 % disability Goal status: PROGRESSING     2.  Allayah will be able to maintain supine bridge for 60'' (dominant leg extended if 120'' reached) as evidence of improved hip extension and core strength (norm for healthy adult ~170'')    Evaluation/Baseline (04/01/2022): 19'' 04/25/2022: 35" 8/9: 50'' Goal status: PROGRESSING     3.  Shawda will be able to maintain plank for 22'' from knees (norm for healthy adult is ~70'' from feet)    Evaluation/Baseline (04/01/2022): 27'' from knees 04/25/2022: 20 seconds from feet 8/9: 43'' Goal status: PROGRESSING     4.  Pheona will be able to complete housework including laundry, not limited by pain   Evaluation/Baseline (04/01/2022): limited 8/9: able to do more laundry, but repetitive bending at dishwasher is still very difficult  Goal status: Progressing     5.  Viena will self report >/= 50% decrease in pain from evaluation    Evaluation/Baseline (04/01/2022): 9/10 max pain 8/9: 25% improvement Goal status: Progressing     PLAN: PT FREQUENCY: 1-2x/week   PT DURATION: 8 weeks (Ending 07/22/2022) (extended)   PLANNED INTERVENTIONS: Therapeutic exercises, Aquatic therapy, Therapeutic activity, Neuro Muscular re-education, Gait training, Patient/Family education, Joint mobilization, Dry Needling, Electrical stimulation, Spinal mobilization and/or manipulation, Moist heat, Taping, Vasopneumatic device, Ionotophoresis 4mg /ml Dexamethasone, and Manual therapy   PLAN FOR NEXT SESSION: progressive core and hip strength, hip  flexor/piriformis stretching     Kevan Ny Wilfred Dayrit PT 05/27/22 7:08 PM

## 2022-05-28 DIAGNOSIS — F411 Generalized anxiety disorder: Secondary | ICD-10-CM | POA: Diagnosis not present

## 2022-05-28 DIAGNOSIS — F4312 Post-traumatic stress disorder, chronic: Secondary | ICD-10-CM | POA: Diagnosis not present

## 2022-05-28 DIAGNOSIS — F330 Major depressive disorder, recurrent, mild: Secondary | ICD-10-CM | POA: Diagnosis not present

## 2022-05-28 NOTE — Therapy (Signed)
OUTPATIENT PHYSICAL THERAPY TREATMENT NOTE/ RE-AUTHORIZATION   Patient Name: Janet Rich MRN: [id number] DOB:07/22/1996, 26 y.o., female Today's Date: 05/29/2022  PCP: Berniece Salines, FNP REFERRING PROVIDER: Meeler, Jodelle Gross, FNP  END OF SESSION:   PT End of Session - 05/29/22 1313     Visit Number 12    Date for PT Re-Evaluation 07/22/22    Authorization Type [employer] (VL 27) - pending additional auth    Authorization Time Period 04/01/2022-06/01/2022 (additional auth submitted 05/28/22)    PT Start Time 1315    PT Stop Time 1400    PT Time Calculation (min) 45 min    Activity Tolerance Patient tolerated treatment well    Behavior During Therapy WFL for tasks assessed/performed             Past Medical History:  Diagnosis Date   Anemia    with periods   Ankle fracture    right   Anxiety    Asthma    Back pain    Bipolar 1 disorder (HCC)    Depression    Bipolar    Endometriosis    Fibromyalgia    GERD (gastroesophageal reflux disease)    Headache    MIGRAINES   IBS (irritable bowel syndrome)    Ovarian cyst    PCOS (polycystic ovarian syndrome)    Pneumonia    age 71   PONV (postoperative nausea and vomiting)    nausea after wisdom teeth extraction   Scoliosis    Vaginal Pap smear, abnormal    bx ok   Past Surgical History:  Procedure Laterality Date   ABDOMINAL HYSTERECTOMY     FRACTURE SURGERY     ankle surgery with impant    HYSTEROSCOPY WITH D & C N/A 09/22/2019   Procedure: DILATATION AND CURETTAGE /HYSTEROSCOPY;  Surgeon: Ward, Elenora Fender, MD;  Location: [facility];  Service: Gynecology;  Laterality: N/A;   LYSIS OF ADHESION N/A 05/08/2021   Procedure: LYSIS OF ADHESION, EXCISION OF ENDOMETRIOSIS;  Surgeon: Christeen Douglas, MD;  Location: [facility];  Service: Gynecology;  Laterality: N/A;   ROBOTIC ASSISTED DIAGNOSTIC LAPAROSCOPY N/A 02/18/2017   Procedure: ROBOTIC ASSISTED DIAGNOSTIC LAPAROSCOPY,EXCISION AND ABLATION OF ENDOMETRIOSIS,LYSIS  OF ADHESIONS;  Surgeon: Olivia Mackie, MD;  Location: [facility];  Service: Gynecology;  Laterality: N/A;   ROBOTIC ASSISTED LAPAROSCOPIC HYSTERECTOMY AND SALPINGECTOMY Bilateral 05/08/2021   Procedure: XI ROBOTIC ASSISTED TOTAL LAPAROSCOPIC HYSTERECTOMY AND SALPINGECTOMY;  Surgeon: Christeen Douglas, MD;  Location: [facility];  Service: Gynecology;  Laterality: Bilateral;   VAGINAL DELIVERY N/A 08/14/2020   Procedure: VAGINAL DELIVERY;  Surgeon: Feliberto Gottron Ihor Austin, MD;  Location: [facility];  Service: Obstetrics;  Laterality: N/A;   WISDOM TOOTH EXTRACTION  2013   Patient Active Problem List   Diagnosis Date Noted   Fibromyalgia syndrome 03/09/2022   Chronic back pain 03/09/2022   Excessive daytime sleepiness 11/28/2021   Endometriosis determined by laparoscopy 05/08/2021   Myalgia 03/11/2021   Elevated BP without diagnosis of hypertension 01/08/2021   Dichorionic diamniotic twin pregnancy in third trimester 08/14/2020   Preeclampsia 08/14/2020   Twin pregnancy delivered vaginally 08/14/2020   Preterm labor 06/17/2020   Right trigeminal neuralgia 11/08/2019   Endometriosis 03/27/2019   PCOS (polycystic ovarian syndrome) 03/27/2019   Moderate persistent asthma 03/07/2019   Post-nasal drainage 03/07/2019   Irritable bowel syndrome with diarrhea 03/07/2019   Gastroesophageal reflux disease without esophagitis 03/07/2019   Class 2 obesity due to excess calories without serious comorbidity with body mass index (  BMI) of 38.0 to 38.9 in adult 09/02/2018   Bipolar 2 disorder (HCC) 08/12/2018   History of low birth weight 03/11/2018   Vulvar atrophy 03/11/2018   History of migraine headaches 05/01/2015   Anxiety and depression 05/01/2015    REFERRING DIAG: Dorsalgia, unspecified [M54.9], Sciatica, unspecified side [M54.30]  THERAPY DIAG:  Low back pain, unspecified back pain laterality, unspecified chronicity, unspecified whether sciatica present  Cervicalgia  Muscle  weakness  Rationale for Evaluation and Treatment Rehabilitation  PERTINENT HISTORY: Bipolar, fibromyalgia, headaches, scoliosis, asthma, hysterectomy, anxiety, depression, PTSD   PRECAUTIONS: None  SUBJECTIVE: Patient reports pain in BIL hips and lower back today. She reports that she is noticing some household tasks have become easier but anything bending past waist height remains very difficult and painful, such as washing her face in the sink and pulling laundry out of the dryer.    PAIN:  Are you having pain? Yes Pain location: low back pain NPRS scale:  Current 4/10  Aggravating factors: bending and lifting, standing (1/2 hour), sitting (1 hour)           NPRS, highest: 7/10 Relieving factors: changing positions, ice, heat, medication, rest Pain description: intermittent, constant, burning, stabbing, and "nerve" Stage: Chronic Stability: staying the same 24 hour pattern: better in the morning, then increasing throughout the day.    OBJECTIVE: (objective measures completed at initial evaluation unless otherwise dated)  DIAGNOSTIC FINDINGS:  None current   GENERAL OBSERVATION/GAIT:           Increased lumbar lordosis, R sided rib elevation in lumbar flexion, increased lumbar lordosis,   SENSATION:          Light touch: Appears intact   MUSCLE LENGTH: Hamstrings: Right no restriction; Left no restriction ASLR: Right ASLR = PSLR; Left ASLR = PSLR Swamy test: Right significant restriction; Left significant restriction Ely's test: Right significant restriction; Left significant restriction                         Tight piriformis bil       LUMBAR AROM   AROM AROM  04/01/2022 AROM 04/25/2022  Flexion WNL WNL  Extension WNL, w/ concordant pain WNL, w/ concordant pain  Right lateral flexion WNL, w/ concordant pain WNL, minor pain  Left lateral flexion WNL, w/ concordant pain WNL, minor pain  Right rotation limited by 25%, w/ concordant pain WNL  Left rotation limited  by 25%, w/ no pain limited by 25%, w/ no pain    (Blank rows = not tested)   DIRECTIONAL PREFERENCE:           none   LE MMT:   MMT Right 04/01/2022 Left 04/01/2022 Right 04/25/2022 Left 04/25/2022  Hip flexion (L2, L3) 4 4 5/5 5/5  Knee extension (L3) 4 4    Knee flexion 4 4    Hip abduction 3+ 4 5/5 4+/5  Hip extension 3+ 4 4/5 5/5  Hip external rotation        Hip internal rotation        Hip adduction        Ankle dorsiflexion (L4)        Ankle plantarflexion (S1)        Ankle inversion        Ankle eversion        Great Toe ext (L5)        Grossly          (Blank rows = not  tested, score listed is out of 5 possible points.  N = WNL, D = diminished, C = clear for gross weakness with myotome testing, * = concordant pain with testing)     LE ROM:   ROM Right 04/01/2022 Left 04/01/2022  Hip flexion 90 90  Hip extension limited limited  Hip abduction      Hip adduction      Hip internal rotation limited limited  Hip external rotation      Knee flexion      Knee extension      Ankle dorsiflexion      Ankle plantarflexion      Ankle inversion      Ankle eversion         (Blank rows = not tested, N = WNL, * = concordant pain with testing)   Functional Tests   Eval (04/01/2022)  04/25/2022    Sustained supine bridge (dominant leg extended at 120'', if reached): 19'' (norm 170'')    35"    Standard plank from knees: 27'' (norm 70'' from feet)  20" from feet                                                                                             LUMBAR SPECIAL TESTS:  Straight leg raise: L (-), R (-) Slump: L (-), R (-) SI: fortin's sign, compression, sacral thrust (+)    PALPATION:            TTP bil PSIS, sacrum, TTP lumbar paraspinals L1-L5    SPINAL SEGMENTAL MOBILITY ASSESSMENT:  Painful, increase lordosis lumbar spine   PATIENT SURVEYS:  Modified Oswestry 29->58% disability  04/25/2022: 29 ->58%   HOME EXERCISE PROGRAM: Access Code:  [email or web address] URL: [email or web address] Date: 04/01/2022 Prepared by: Shearon Balo   Exercises - Hip Flexor Stretch at [facility] of Bed  - 2 x daily - 7 x weekly - 2 sets - 45 second hold - Supine Posterior Pelvic Tilt  - 2 x daily - 7 x weekly - 2 sets - 10 reps - 5'' hold - Supine Hip Adduction Isometric with Ball  - 1 x daily - 7 x weekly - 1 sets - 10 reps - 10'' hold - Supine Bridge  - 1 x daily - 7 x weekly - 2 sets - 10 reps - 5 second hold hold  Added 04/15/2022: - Marching Bridge  - 1 x daily - 7 x weekly - 3 sets - 20 reps - Prone Swimmer  - 1 x daily - 7 x weekly - 3 sets - 20 reps - Plank on Knees  - 1 x daily - 7 x weekly - 3 sets - to failure hold   ASTERISK SIGNS     Asterisk Signs Eval (04/01/2022) 8/9          piriformis Sig restriction            Hip flexors Sig restriction Moderate restriction           bridge 19'' 50''           Plank from knees 27''  43''  R hip ext and abd MMT 3+                  TODAY'S TREATMENT  [facility] Adult PT Treatment:                                                DATE: 05/29/2022 Aquatic therapy at [address] - therapeutic pool temp 91 degrees Pt enters building independently.  Treatment took place in water 3.8 to  4 ft 8 in.feet deep depending upon activity.  Pt entered and exited the pool via stair and handrails independently.   Pt pain level 4/10 at initiation of water walking.  Therapeutic Exercise: Walking forward/backwards/side stepping Runners stretch x30" BIL Hamstring stretch x30" BIL Figure 4 squat stretch, BIL UE support 2x30" BIL Standing thoracic rotation with noodle x1' Cat/cow with noodle x20 Hip flexor stretch with noodle x1' BIL Warrior I with noodle lift x10 BIL Warrior II with noodle hold x30" BIL Warrior III with noodle hold x30" BIL At edge of pool, pt performed LE exercise: Hip abd/add x20 BIL Hip ext/flex with knee straight x 20 BIL Marching hip flexion to knee  extension x20 BIL Squats 2x20 Heel/toe raises x20  Sitting on noodle with yellow dumbbells at sides Bicycle kicks x1' Scissor kicks x1'  Flutter kicks x1'  Pt requires the buoyancy of water for active assisted exercises with buoyancy supported for strengthening and AROM exercises. Hydrostatic pressure also supports joints by unweighting joint load by at least 50 % in 3-4 feet depth water. 80% in chest to neck deep water. Water will provide assistance with movement using the current and laminar flow while the buoyancy reduces weight bearing. Pt requires the viscosity of the water for resistance with strengthening exercises.  DATE: 05/27/2022 Therapeutic Exercise: nu-step L5 83m while taking subjective and planning session with patient Hip flexor stretch Piriformis stretch RF stretch LTR - 20x  Bridge - 10x Hip adduction ball squeeze - 5'' hold - 2x10 Alternating clamshell with GTB - 2x10 Alternating SLR from foam roller - 2x10 Isometric ball squeeze in hooklying - 5'' - 2x10   Therapeutic Activity - collecting information for goals, checking progress, and reviewing with patient  [facility] Adult PT Treatment:                                                DATE: 05/15/2022 Aquatic therapy at [address] - therapeutic pool temp 92 degrees Pt enters building independently.  Treatment took place in water 3.8 to  4 ft 8 in.feet deep depending upon activity.  Pt entered and exited the pool via stair and handrails independently.   Pt pain level 4/10 at initiation of water walking.  Therapeutic Exercise: Walking forward/backwards/side stepping with yellow DB at sides Lunge walk forward with yellow DB at sides x 2 laps  Side step squat with yellow DB shoulder abd/add x 2 laps Runners stretch x30" BIL Hamstring stretch x30" BIL Figure 4 squat stretch, BIL UE support 2x30" BIL Standing thoracic rotation with noodle x1' Cat/cow with noodle x20 Hip flexor stretch with noodle x1'  BIL Warrior I with noodle lift x10 BIL At edge of pool, pt performed LE exercise: Hip abd/add x20  BIL Hip ext/flex with knee straight x 20 BIL Marching hip flexion to knee extension x20 BIL Squats 2x20 Heel/toe raises x20  Sitting on noodle with yellow dumbbells at sides Bicycle kicks x1' Scissor kicks x1'  Flutter kicks x1'  Pt requires the buoyancy of water for active assisted exercises with buoyancy supported for strengthening and AROM exercises. Hydrostatic pressure also supports joints by unweighting joint load by at least 50 % in 3-4 feet depth water. 80% in chest to neck deep water. Water will provide assistance with movement using the current and laminar flow while the buoyancy reduces weight bearing. Pt requires the viscosity of the water for resistance with strengthening exercises.  PATIENT EDUCATION:  POC, diagnosis, prognosis, HEP, and outcome measures.  Pt educated via explanation, demonstration, and handout (HEP).  Pt confirms understanding verbally.     ASSESSMENT:   CLINICAL IMPRESSION: Patient presents to aquatic PT with mild to moderate pain in BIL hips and lower back and reports HEP compliance. She remains limited in daily activities due to her back pain and reports that PT is helping improve her pain and function but that she still "has a ways to go." Session today focused on BIL LE strengthening and general conditioning in the aquatic environment for use of buoyancy to offload joints and the viscosity of water as resistance during therapeutic exercise. Patient was able to tolerate all prescribed exercises in the aquatic environment with no adverse effects and reports 3/10 pain at the end of the session. Patient continues to benefit from skilled PT services on land and aquatic based and should be progressed as able to improve functional independence.   OBJECTIVE IMPAIRMENTS: Pain, core strength, hip and LE strength   ACTIVITY LIMITATIONS: lifting, walking, standing,  child care, housework   PERSONAL FACTORS: See medical history and pertinent history       GOALS:     SHORT TERM GOALS: Target date: 04/22/2022   Aleyiah will be >75% HEP compliant to improve carryover between sessions and facilitate independent management of condition   Evaluation (04/01/2022): ongoing 04/25/2022: Pt reports adherence to her HEP Goal status: ACHIEVED     LONG TERM GOALS: Target date: 07/22/2022 (extended)   Leeda will show a >/= 12 pt improvement in their ODI score (MCID is 12 pts) as a proxy for functional improvement    Evaluation/Baseline (04/01/2022): 58%  04/25/2022: 58% 8/9: 52.0 % disability Goal status: PROGRESSING     2.  Letrice will be able to maintain supine bridge for 60'' (dominant leg extended if 120'' reached) as evidence of improved hip extension and core strength (norm for healthy adult ~170'')    Evaluation/Baseline (04/01/2022): 19'' 04/25/2022: 35" 8/9: 50'' Goal status: PROGRESSING     3.  Azelle will be able to maintain plank for 60'' from knees (norm for healthy adult is ~70'' from feet)    Evaluation/Baseline (04/01/2022): 27'' from knees 04/25/2022: 20 seconds from feet 8/9: 43'' Goal status: PROGRESSING     4.  Joceline will be able to complete housework including laundry, not limited by pain   Evaluation/Baseline (04/01/2022): limited 8/9: able to do more laundry, but repetitive bending at dishwasher is still very difficult  Goal status: Progressing     5.  Aleeah will self report >/= 50% decrease in pain from evaluation    Evaluation/Baseline (04/01/2022): 9/10 max pain 8/9: 25% improvement Goal status: Progressing     PLAN: PT FREQUENCY: 1-2x/week   PT DURATION: 8 weeks (Ending 07/22/2022) (extended)  PLANNED INTERVENTIONS: Therapeutic exercises, Aquatic therapy, Therapeutic activity, Neuro Muscular re-education, Gait training, Patient/Family education, Joint mobilization, Dry Needling, Electrical stimulation, Spinal  mobilization and/or manipulation, Moist heat, Taping, Vasopneumatic device, Ionotophoresis 4mg /ml Dexamethasone, and Manual therapy   PLAN FOR NEXT SESSION: progressive core and hip strength, hip flexor/piriformis stretching     Margarette Canada PTA 05/29/22 1:59 PM

## 2022-05-29 ENCOUNTER — Ambulatory Visit: Payer: Medicaid Other

## 2022-05-29 DIAGNOSIS — M6281 Muscle weakness (generalized): Secondary | ICD-10-CM

## 2022-05-29 DIAGNOSIS — M545 Low back pain, unspecified: Secondary | ICD-10-CM | POA: Diagnosis not present

## 2022-05-29 DIAGNOSIS — M542 Cervicalgia: Secondary | ICD-10-CM | POA: Diagnosis not present

## 2022-06-01 DIAGNOSIS — F3181 Bipolar II disorder: Secondary | ICD-10-CM | POA: Diagnosis not present

## 2022-06-03 ENCOUNTER — Ambulatory Visit: Payer: Medicaid Other

## 2022-06-03 DIAGNOSIS — F4312 Post-traumatic stress disorder, chronic: Secondary | ICD-10-CM | POA: Diagnosis not present

## 2022-06-03 DIAGNOSIS — F411 Generalized anxiety disorder: Secondary | ICD-10-CM | POA: Diagnosis not present

## 2022-06-03 DIAGNOSIS — F330 Major depressive disorder, recurrent, mild: Secondary | ICD-10-CM | POA: Diagnosis not present

## 2022-06-08 DIAGNOSIS — M65872 Other synovitis and tenosynovitis, left ankle and foot: Secondary | ICD-10-CM | POA: Diagnosis not present

## 2022-06-08 DIAGNOSIS — S93492S Sprain of other ligament of left ankle, sequela: Secondary | ICD-10-CM | POA: Diagnosis not present

## 2022-06-08 DIAGNOSIS — M5416 Radiculopathy, lumbar region: Secondary | ICD-10-CM | POA: Diagnosis not present

## 2022-06-08 DIAGNOSIS — M5126 Other intervertebral disc displacement, lumbar region: Secondary | ICD-10-CM | POA: Diagnosis not present

## 2022-06-08 DIAGNOSIS — M24571 Contracture, right ankle: Secondary | ICD-10-CM | POA: Diagnosis not present

## 2022-06-08 DIAGNOSIS — M25871 Other specified joint disorders, right ankle and foot: Secondary | ICD-10-CM | POA: Diagnosis not present

## 2022-06-08 DIAGNOSIS — M25472 Effusion, left ankle: Secondary | ICD-10-CM | POA: Diagnosis not present

## 2022-06-08 DIAGNOSIS — M24671 Ankylosis, right ankle: Secondary | ICD-10-CM | POA: Diagnosis not present

## 2022-06-08 DIAGNOSIS — M5136 Other intervertebral disc degeneration, lumbar region: Secondary | ICD-10-CM | POA: Diagnosis not present

## 2022-06-08 DIAGNOSIS — M65871 Other synovitis and tenosynovitis, right ankle and foot: Secondary | ICD-10-CM | POA: Diagnosis not present

## 2022-06-10 ENCOUNTER — Ambulatory Visit: Payer: Medicaid Other

## 2022-06-10 DIAGNOSIS — F4312 Post-traumatic stress disorder, chronic: Secondary | ICD-10-CM | POA: Diagnosis not present

## 2022-06-10 DIAGNOSIS — F411 Generalized anxiety disorder: Secondary | ICD-10-CM | POA: Diagnosis not present

## 2022-06-10 DIAGNOSIS — F330 Major depressive disorder, recurrent, mild: Secondary | ICD-10-CM | POA: Diagnosis not present

## 2022-06-11 DIAGNOSIS — M797 Fibromyalgia: Secondary | ICD-10-CM | POA: Diagnosis not present

## 2022-06-11 NOTE — Therapy (Signed)
OUTPATIENT PHYSICAL THERAPY TREATMENT NOTE/ RE-AUTHORIZATION   Patient Name: Janet Rich MRN: [id number] DOB:[birth year], 26 y.o., female Today's Date: 06/12/2022  PCP: Bo Merino, FNP REFERRING PROVIDER: Meeler, Sherren Kerns, FNP  END OF SESSION:   PT End of Session - 06/12/22 1229     Visit Number 13    Date for PT Re-Evaluation 07/22/22    Authorization Type [employer] (VL 27) - pending additional auth    Authorization Time Period 8 visits approved 06/01/22-07/02/22    Authorization - Visit Number 12    Authorization - Number of Visits 19    PT Start Time 1230    PT Stop Time 1315    PT Time Calculation (min) 45 min    Activity Tolerance Patient tolerated treatment well    Behavior During Therapy WFL for tasks assessed/performed              Past Medical History:  Diagnosis Date   Anemia    with periods   Ankle fracture    right   Anxiety    Asthma    Back pain    Bipolar 1 disorder (HCC)    Depression    Bipolar    Endometriosis    Fibromyalgia    GERD (gastroesophageal reflux disease)    Headache    MIGRAINES   IBS (irritable bowel syndrome)    Ovarian cyst    PCOS (polycystic ovarian syndrome)    Pneumonia    age 39   PONV (postoperative nausea and vomiting)    nausea after wisdom teeth extraction   Scoliosis    Vaginal Pap smear, abnormal    bx ok   Past Surgical History:  Procedure Laterality Date   ABDOMINAL HYSTERECTOMY     FRACTURE SURGERY     ankle surgery with impant    HYSTEROSCOPY WITH D & C N/A 09/22/2019   Procedure: DILATATION AND CURETTAGE /HYSTEROSCOPY;  Surgeon: Ward, Honor Loh, MD;  Location: [facility];  Service: Gynecology;  Laterality: N/A;   LYSIS OF ADHESION N/A 05/08/2021   Procedure: LYSIS OF ADHESION, EXCISION OF ENDOMETRIOSIS;  Surgeon: Benjaman Kindler, MD;  Location: [facility];  Service: Gynecology;  Laterality: N/A;   ROBOTIC ASSISTED DIAGNOSTIC LAPAROSCOPY N/A 02/18/2017   Procedure: ROBOTIC ASSISTED DIAGNOSTIC  LAPAROSCOPY,EXCISION AND ABLATION OF ENDOMETRIOSIS,LYSIS OF ADHESIONS;  Surgeon: Brien Few, MD;  Location: [facility];  Service: Gynecology;  Laterality: N/A;   ROBOTIC ASSISTED LAPAROSCOPIC HYSTERECTOMY AND SALPINGECTOMY Bilateral 05/08/2021   Procedure: XI ROBOTIC ASSISTED TOTAL LAPAROSCOPIC HYSTERECTOMY AND SALPINGECTOMY;  Surgeon: Benjaman Kindler, MD;  Location: [facility];  Service: Gynecology;  Laterality: Bilateral;   VAGINAL DELIVERY N/A 08/14/2020   Procedure: VAGINAL DELIVERY;  Surgeon: Ouida Sills Gwen Her, MD;  Location: [facility];  Service: Obstetrics;  Laterality: N/A;   [place] EXTRACTION  2013   Patient Active Problem List   Diagnosis Date Noted   Fibromyalgia syndrome 03/09/2022   Chronic back pain 03/09/2022   Excessive daytime sleepiness 11/28/2021   Endometriosis determined by laparoscopy 05/08/2021   Myalgia 03/11/2021   Elevated BP without diagnosis of hypertension 01/08/2021   Dichorionic diamniotic twin pregnancy in third trimester 08/14/2020   Preeclampsia 08/14/2020   Twin pregnancy delivered vaginally 08/14/2020   Preterm labor 06/17/2020   Right trigeminal neuralgia 11/08/2019   Endometriosis 03/27/2019   PCOS (polycystic ovarian syndrome) 03/27/2019   Moderate persistent asthma 03/07/2019   Post-nasal drainage 03/07/2019   Irritable bowel syndrome with diarrhea 03/07/2019   Gastroesophageal reflux disease without esophagitis  03/07/2019   Class 2 obesity due to excess calories without serious comorbidity with body mass index (BMI) of 38.0 to 38.9 in adult 09/02/2018   Bipolar 2 disorder (HCC) 08/12/2018   History of low birth weight 03/11/2018   Vulvar atrophy 03/11/2018   History of migraine headaches 05/01/2015   Anxiety and depression 05/01/2015    REFERRING DIAG: Dorsalgia, unspecified [M54.9], Sciatica, unspecified side [M54.30]  THERAPY DIAG:  Low back pain, unspecified back pain laterality, unspecified chronicity, unspecified whether  sciatica present  Cervicalgia  Muscle weakness  Rationale for Evaluation and Treatment Rehabilitation  PERTINENT HISTORY: Bipolar, fibromyalgia, headaches, scoliosis, asthma, hysterectomy, anxiety, depression, PTSD   PRECAUTIONS: None  SUBJECTIVE: Patient reports high low back pain today.   PAIN:  Are you having pain? Yes Pain location: low back pain NPRS scale:  Current 7/10  Aggravating factors: bending and lifting, standing (1/2 hour), sitting (1 hour)           NPRS, highest: 7/10 Relieving factors: changing positions, ice, heat, medication, rest Pain description: intermittent, constant, burning, stabbing, and "nerve" Stage: Chronic Stability: staying the same 24 hour pattern: better in the morning, then increasing throughout the day.    OBJECTIVE: (objective measures completed at initial evaluation unless otherwise dated)  DIAGNOSTIC FINDINGS:  None current   GENERAL OBSERVATION/GAIT:           Increased lumbar lordosis, R sided rib elevation in lumbar flexion, increased lumbar lordosis,   SENSATION:          Light touch: Appears intact   MUSCLE LENGTH: Hamstrings: Right no restriction; Left no restriction ASLR: Right ASLR = PSLR; Left ASLR = PSLR Stene test: Right significant restriction; Left significant restriction Ely's test: Right significant restriction; Left significant restriction                         Tight piriformis bil       LUMBAR AROM   AROM AROM  04/01/2022 AROM 04/25/2022  Flexion WNL WNL  Extension WNL, w/ concordant pain WNL, w/ concordant pain  Right lateral flexion WNL, w/ concordant pain WNL, minor pain  Left lateral flexion WNL, w/ concordant pain WNL, minor pain  Right rotation limited by 25%, w/ concordant pain WNL  Left rotation limited by 25%, w/ no pain limited by 25%, w/ no pain    (Blank rows = not tested)   DIRECTIONAL PREFERENCE:           none   LE MMT:   MMT Right 04/01/2022 Left 04/01/2022 Right 04/25/2022  Left 04/25/2022  Hip flexion (L2, L3) 4 4 5/5 5/5  Knee extension (L3) 4 4    Knee flexion 4 4    Hip abduction 3+ 4 5/5 4+/5  Hip extension 3+ 4 4/5 5/5  Hip external rotation        Hip internal rotation        Hip adduction        Ankle dorsiflexion (L4)        Ankle plantarflexion (S1)        Ankle inversion        Ankle eversion        Great Toe ext (L5)        Grossly          (Blank rows = not tested, score listed is out of 5 possible points.  N = WNL, D = diminished, C = clear for gross weakness with myotome testing, * =  concordant pain with testing)     LE ROM:   ROM Right 04/01/2022 Left 04/01/2022  Hip flexion 90 90  Hip extension limited limited  Hip abduction      Hip adduction      Hip internal rotation limited limited  Hip external rotation      Knee flexion      Knee extension      Ankle dorsiflexion      Ankle plantarflexion      Ankle inversion      Ankle eversion         (Blank rows = not tested, N = WNL, * = concordant pain with testing)   Functional Tests   Eval (04/01/2022)  04/25/2022    Sustained supine bridge (dominant leg extended at 120'', if reached): 19'' (norm 170'')    35"    Standard plank from knees: 27'' (norm 70'' from feet)  20" from feet                                                                                             LUMBAR SPECIAL TESTS:  Straight leg raise: L (-), R (-) Slump: L (-), R (-) SI: fortin's sign, compression, sacral thrust (+)    PALPATION:            TTP bil PSIS, sacrum, TTP lumbar paraspinals L1-L5    SPINAL SEGMENTAL MOBILITY ASSESSMENT:  Painful, increase lordosis lumbar spine   PATIENT SURVEYS:  Modified Oswestry 29->58% disability  04/25/2022: 29 ->58%   HOME EXERCISE PROGRAM: Access Code: [email or web address] URL: [email or web address] Date: 04/01/2022 Prepared by: Shearon Balo   Exercises - Hip Flexor Stretch at [facility] of [facility]  - 2 x daily - 7 x weekly - 2 sets - 45  second hold - Supine Posterior Pelvic Tilt  - 2 x daily - 7 x weekly - 2 sets - 10 reps - 5'' hold - Supine Hip Adduction Isometric with Ball  - 1 x daily - 7 x weekly - 1 sets - 10 reps - 10'' hold - Supine Bridge  - 1 x daily - 7 x weekly - 2 sets - 10 reps - 5 second hold hold  Added 04/15/2022: - Marching Bridge  - 1 x daily - 7 x weekly - 3 sets - 20 reps - Prone Swimmer  - 1 x daily - 7 x weekly - 3 sets - 20 reps - Plank on Knees  - 1 x daily - 7 x weekly - 3 sets - to failure hold   ASTERISK SIGNS     Asterisk Signs Eval (04/01/2022) 8/9          piriformis Sig restriction            Hip flexors Sig restriction Moderate restriction           bridge 19'' 50''           Plank from knees 27''  83''          R hip ext and abd MMT 3+  TODAY'S TREATMENT  [facility] Adult PT Treatment:                                                DATE: 06/11/22 Aquatic therapy at [address] - therapeutic pool temp 91 degrees Pt enters building independently.  Treatment took place in water 3.8 to  4 ft 8 in.feet deep depending upon activity.  Pt entered and exited the pool via stair and handrails independently.   Pt pain level 7/10 at initiation of water walking.  Therapeutic Exercise: Walking forward/backwards/side stepping with yellow DB by sides Runners stretch x30" BIL Hamstring stretch x30" BIL Figure 4 squat stretch, BIL UE support 2x30" BIL Standing thoracic rotation with noodle x1' Cat/cow with noodle x20 Hip flexor stretch with noodle x1' BIL Warrior I with noodle lift x10 BIL Warrior II with noodle hold x30" BIL Warrior III with noodle hold x30" BIL At edge of pool, pt performed LE exercise: Hip abd/add x20 BIL Hip ext/flex with knee straight x 20 BIL Marching hip flexion to knee extension x20 BIL Squats 2x20 Heel/toe raises x20  Sitting on noodle with yellow dumbbells at sides Bicycle kicks x1' Scissor kicks x1'  Flutter kicks x1'  Pt requires  the buoyancy of water for active assisted exercises with buoyancy supported for strengthening and AROM exercises. Hydrostatic pressure also supports joints by unweighting joint load by at least 50 % in 3-4 feet depth water. 80% in chest to neck deep water. Water will provide assistance with movement using the current and laminar flow while the buoyancy reduces weight bearing. Pt requires the viscosity of the water for resistance with strengthening exercises.  [facility] Adult PT Treatment:                                                DATE: 05/29/2022 Aquatic therapy at [address] - therapeutic pool temp 91 degrees Pt enters building independently.  Treatment took place in water 3.8 to  4 ft 8 in.feet deep depending upon activity.  Pt entered and exited the pool via stair and handrails independently.   Pt pain level 4/10 at initiation of water walking.  Therapeutic Exercise: Walking forward/backwards/side stepping Runners stretch x30" BIL Hamstring stretch x30" BIL Figure 4 squat stretch, BIL UE support 2x30" BIL Standing thoracic rotation with noodle x1' Cat/cow with noodle x20 Hip flexor stretch with noodle x1' BIL Warrior I with noodle lift x10 BIL Warrior II with noodle hold x30" BIL Warrior III with noodle hold x30" BIL At edge of pool, pt performed LE exercise: Hip abd/add x20 BIL Hip ext/flex with knee straight x 20 BIL Marching hip flexion to knee extension x20 BIL Squats 2x20 Heel/toe raises x20  Sitting on noodle with yellow dumbbells at sides Bicycle kicks x1' Scissor kicks x1'  Flutter kicks x1'  Pt requires the buoyancy of water for active assisted exercises with buoyancy supported for strengthening and AROM exercises. Hydrostatic pressure also supports joints by unweighting joint load by at least 50 % in 3-4 feet depth water. 80% in chest to neck deep water. Water will provide assistance with movement using the current and laminar flow while the buoyancy  reduces weight bearing. Pt requires the viscosity of  the water for resistance with strengthening exercises.  DATE: 05/27/2022 Therapeutic Exercise: nu-step L5 48m while taking subjective and planning session with patient Hip flexor stretch Piriformis stretch RF stretch LTR - 20x  Bridge - 10x Hip adduction ball squeeze - 5'' hold - 2x10 Alternating clamshell with GTB - 2x10 Alternating SLR from foam roller - 2x10 Isometric ball squeeze in hooklying - 5'' - 2x10   Therapeutic Activity - collecting information for goals, checking progress, and reviewing with patient   PATIENT EDUCATION:  POC, diagnosis, prognosis, HEP, and outcome measures.  Pt educated via explanation, demonstration, and handout (HEP).  Pt confirms understanding verbally.     ASSESSMENT:   CLINICAL IMPRESSION: Patient presents to aquatic PT session with high pain in her lower back and reports recent life stressors have been making it difficult to complete her HEP. Session today focused on BIL LE and core strengthening, stretching, and general conditioning in the aquatic environment for use of buoyancy to offload joints and the viscosity of water as resistance during therapeutic exercise. Patient was able to tolerate all prescribed exercises in the aquatic environment with no adverse effects and reports 4/10 pain at the end of the session. Patient continues to benefit from skilled PT services on land and aquatic based and should be progressed as able to improve functional independence.   OBJECTIVE IMPAIRMENTS: Pain, core strength, hip and LE strength   ACTIVITY LIMITATIONS: lifting, walking, standing, child care, housework   PERSONAL FACTORS: See medical history and pertinent history       GOALS:     SHORT TERM GOALS: Target date: 04/22/2022   Anabela will be >75% HEP compliant to improve carryover between sessions and facilitate independent management of condition   Evaluation (04/01/2022): ongoing 04/25/2022: Pt  reports adherence to her HEP Goal status: ACHIEVED     LONG TERM GOALS: Target date: 07/22/2022 (extended)   Tramya will show a >/= 12 pt improvement in their ODI score (MCID is 12 pts) as a proxy for functional improvement    Evaluation/Baseline (04/01/2022): 58%  04/25/2022: 58% 8/9: 52.0 % disability Goal status: PROGRESSING     2.  Taurie will be able to maintain supine bridge for 60'' (dominant leg extended if 120'' reached) as evidence of improved hip extension and core strength (norm for healthy adult ~170'')    Evaluation/Baseline (04/01/2022): 19'' 04/25/2022: 35" 8/9: 50'' Goal status: PROGRESSING     3.  Goldye will be able to maintain plank for 57'' from knees (norm for healthy adult is ~70'' from feet)    Evaluation/Baseline (04/01/2022): 27'' from knees 04/25/2022: 20 seconds from feet 8/9: 43'' Goal status: PROGRESSING     4.  Else will be able to complete housework including laundry, not limited by pain   Evaluation/Baseline (04/01/2022): limited 8/9: able to do more laundry, but repetitive bending at dishwasher is still very difficult  Goal status: Progressing     5.  Nkiruka will self report >/= 50% decrease in pain from evaluation    Evaluation/Baseline (04/01/2022): 9/10 max pain 8/9: 25% improvement Goal status: Progressing     PLAN: PT FREQUENCY: 1-2x/week   PT DURATION: 8 weeks (Ending 07/22/2022) (extended)   PLANNED INTERVENTIONS: Therapeutic exercises, Aquatic therapy, Therapeutic activity, Neuro Muscular re-education, Gait training, Patient/Family education, Joint mobilization, Dry Needling, Electrical stimulation, Spinal mobilization and/or manipulation, Moist heat, Taping, Vasopneumatic device, Ionotophoresis 4mg /ml Dexamethasone, and Manual therapy   PLAN FOR NEXT SESSION: progressive core and hip strength, hip flexor/piriformis stretching  Berta Minor PTA 06/12/22 1:21 PM

## 2022-06-12 ENCOUNTER — Ambulatory Visit: Payer: Medicaid Other

## 2022-06-12 DIAGNOSIS — M545 Low back pain, unspecified: Secondary | ICD-10-CM | POA: Diagnosis not present

## 2022-06-12 DIAGNOSIS — M542 Cervicalgia: Secondary | ICD-10-CM | POA: Diagnosis not present

## 2022-06-12 DIAGNOSIS — M6281 Muscle weakness (generalized): Secondary | ICD-10-CM

## 2022-06-16 ENCOUNTER — Ambulatory Visit: Payer: Medicaid Other | Admitting: Nurse Practitioner

## 2022-06-16 ENCOUNTER — Other Ambulatory Visit: Payer: Self-pay

## 2022-06-16 ENCOUNTER — Encounter: Payer: Self-pay | Admitting: Nurse Practitioner

## 2022-06-16 VITALS — BP 118/76 | Temp 99.0°F | Resp 16 | Ht 64.0 in | Wt 203.0 lb

## 2022-06-16 DIAGNOSIS — M5136 Other intervertebral disc degeneration, lumbar region: Secondary | ICD-10-CM | POA: Diagnosis not present

## 2022-06-16 DIAGNOSIS — G8929 Other chronic pain: Secondary | ICD-10-CM | POA: Diagnosis not present

## 2022-06-16 DIAGNOSIS — M545 Low back pain, unspecified: Secondary | ICD-10-CM

## 2022-06-16 DIAGNOSIS — Z6834 Body mass index (BMI) 34.0-34.9, adult: Secondary | ICD-10-CM | POA: Diagnosis not present

## 2022-06-16 DIAGNOSIS — E6609 Other obesity due to excess calories: Secondary | ICD-10-CM | POA: Diagnosis not present

## 2022-06-16 MED ORDER — SEMAGLUTIDE-WEIGHT MANAGEMENT 0.25 MG/0.5ML ~~LOC~~ SOAJ: 0.2500 mg | SUBCUTANEOUS | 0 refills | Status: AC

## 2022-06-16 NOTE — Assessment & Plan Note (Signed)
Ordered Wegovy.  Also gave patient information for [place] weight loss program.  Continue to work on lifestyle management. 

## 2022-06-16 NOTE — Assessment & Plan Note (Signed)
Ordered Wegovy.  Also gave patient information for [place] weight loss program.  Continue to work on lifestyle management. 

## 2022-06-16 NOTE — Progress Notes (Signed)
BP 118/76   Temp 99 F (37.2 C) (Oral)   Resp 16   Ht 5\' 4"  (1.626 m)   Wt 203 lb (92.1 kg)   LMP 04/08/2021   SpO2 99%   BMI 34.84 kg/m    Subjective:    Patient ID: 04/10/2021, female    DOB: [birth year], 26 y.o.   MRN: [id number]  HPI: Janet Rich is a 26 y.o. female  Chief Complaint  Patient presents with   Obesity   Obesity:  She says she is watching what she eats.  She says she walks about a mile a day.  She says she uses a stationary bike daily.   Her weight today 203 lbs with a BMI of 34.84. She says her highest weight was 220 lbs. She says she has tried lifestyle medication but has never tried weight loss medication.  Discussed different weight loss medications.  She denies any history of pancreatitis or family history of thyroid cancer.  We will try and get patient approved for [employer] [facility] [employer].  PA started.  Also gave patient information for [place] weight loss management program.  Chronic low back pain/bulging of lumbar disc: Patient was told by her physical therapist that if she lost weight it would help with her back pain.  Patient is requesting with weight loss medications.   Relevant past medical, surgical, family and social history reviewed and updated as indicated. Interim medical history since our last visit reviewed. Allergies and medications reviewed and updated.  Review of Systems  Constitutional: Negative for fever or weight change.  Respiratory: Negative for cough and shortness of breath.   Cardiovascular: Negative for chest pain or palpitations.  Gastrointestinal: Negative for abdominal pain, no bowel changes.  Musculoskeletal: Negative for gait problem or joint swelling.  Skin: Negative for rash.  Neurological: Negative for dizziness or headache.  No other specific complaints in a complete review of systems (except as listed in HPI above).      Objective:    BP 118/76   Temp 99 F (37.2 C) (Oral)   Resp 16   Ht 5\' 4"  (1.626 m)   Wt 203 lb  (92.1 kg)   LMP 04/08/2021   SpO2 99%   BMI 34.84 kg/m   Wt Readings from Last 3 Encounters:  06/16/22 203 lb (92.1 kg)  03/16/22 209 lb (94.8 kg)  03/09/22 205 lb (93 kg)    Physical Exam  Constitutional: Patient appears well-developed and well-nourished. Obese No distress.  HEENT: head atraumatic, normocephalic, pupils equal and reactive to light,  neck supple Cardiovascular: Normal rate, regular rhythm and normal heart sounds.  No murmur heard. No BLE edema. Pulmonary/Chest: Effort normal and breath sounds normal. No respiratory distress. Abdominal: Soft.  There is no tenderness. Psychiatric: Patient has a normal mood and affect. behavior is normal. Judgment and thought content normal.     Assessment & Plan:   Problem List Items Addressed This Visit       Musculoskeletal and Integument   Bulging of lumbar intervertebral disc    Ordered Wegovy.  Also gave patient information for [place] weight loss program.  Continue to work on lifestyle management.        Other   Class 1 obesity due to excess calories with serious comorbidity and body mass index (BMI) of 34.0 to 34.9 in adult - Primary    Ordered 03/18/22.  Also gave patient information for [place] weight loss program.  Continue to work on lifestyle management.  Relevant Medications   Semaglutide-Weight Management 0.25 MG/0.5ML SOAJ   Chronic back pain    Ordered Wegovy.  Also gave patient information for [place] weight loss program.  Continue to work on lifestyle management.        Follow up plan: Return in about 6 months (around 12/17/2022) for follow up.

## 2022-06-16 NOTE — Assessment & Plan Note (Signed)
Ordered [id number].  Also gave patient information for [place] weight loss program.  Continue to work on lifestyle management.

## 2022-06-17 DIAGNOSIS — F4312 Post-traumatic stress disorder, chronic: Secondary | ICD-10-CM | POA: Diagnosis not present

## 2022-06-17 DIAGNOSIS — F330 Major depressive disorder, recurrent, mild: Secondary | ICD-10-CM | POA: Diagnosis not present

## 2022-06-17 DIAGNOSIS — F411 Generalized anxiety disorder: Secondary | ICD-10-CM | POA: Diagnosis not present

## 2022-06-19 DIAGNOSIS — Z419 Encounter for procedure for purposes other than remedying health state, unspecified: Secondary | ICD-10-CM | POA: Diagnosis not present

## 2022-06-22 ENCOUNTER — Encounter: Payer: Self-pay | Admitting: Nurse Practitioner

## 2022-06-24 DIAGNOSIS — F411 Generalized anxiety disorder: Secondary | ICD-10-CM | POA: Diagnosis not present

## 2022-06-24 DIAGNOSIS — F330 Major depressive disorder, recurrent, mild: Secondary | ICD-10-CM | POA: Diagnosis not present

## 2022-06-24 DIAGNOSIS — F4312 Post-traumatic stress disorder, chronic: Secondary | ICD-10-CM | POA: Diagnosis not present

## 2022-06-26 DIAGNOSIS — M5416 Radiculopathy, lumbar region: Secondary | ICD-10-CM | POA: Diagnosis not present

## 2022-06-26 DIAGNOSIS — M5126 Other intervertebral disc displacement, lumbar region: Secondary | ICD-10-CM | POA: Diagnosis not present

## 2022-06-28 ENCOUNTER — Ambulatory Visit
Admission: EM | Admit: 2022-06-28 | Discharge: 2022-06-28 | Disposition: A | Discharge status: Home / Self Care | Payer: Medicaid Other

## 2022-06-28 ENCOUNTER — Ambulatory Visit (INDEPENDENT_AMBULATORY_CARE_PROVIDER_SITE_OTHER): Payer: Medicaid Other

## 2022-06-28 DIAGNOSIS — R053 Chronic cough: Secondary | ICD-10-CM | POA: Diagnosis not present

## 2022-06-28 DIAGNOSIS — R059 Cough, unspecified: Secondary | ICD-10-CM

## 2022-06-28 MED ORDER — PREDNISONE 20 MG PO TABS: 40.0000 mg | ORAL_TABLET | Freq: Every day | ORAL | 0 refills | Status: AC

## 2022-06-28 NOTE — Discharge Instructions (Signed)
Chest x-ray was normal as we discussed.  I am prescribing you prednisone given your history of asthma and persistent cough.  Highly suspicious of bronchitis as we discussed.  Please follow-up if symptoms persist or worsen.

## 2022-06-28 NOTE — ED Triage Notes (Signed)
Pt c/o cough, nasal drainage, fatigue   Onset ~ 3 weeks ago

## 2022-06-28 NOTE — ED Provider Notes (Signed)
[facility]    CSN: [id number] Arrival date & time: 06/28/22  0931      History   Chief Complaint Chief Complaint  Patient presents with   Cough    HPI Janet Rich is a 26 y.o. female.   Patient presents with persistent cough, nasal congestion with fatigue that started about 3 weeks ago.  Denies chest pain or shortness of breath and patient has been using albuterol inhaler as she has asthma with minimal improvement of symptoms.  Denies any known fevers.  Her son has similar symptoms.  Denies throat, ear pain, nausea, vomiting, diarrhea, abdominal pain.   Cough   Past Medical History:  Diagnosis Date   Anemia    with periods   Ankle fracture    right   Anxiety    Asthma    Back pain    Bipolar 1 disorder (HCC)    Depression    Bipolar    Endometriosis    Fibromyalgia    GERD (gastroesophageal reflux disease)    Headache    MIGRAINES   IBS (irritable bowel syndrome)    Ovarian cyst    PCOS (polycystic ovarian syndrome)    Pneumonia    age 42   PONV (postoperative nausea and vomiting)    nausea after wisdom teeth extraction   Scoliosis    Vaginal Pap smear, abnormal    bx ok    Patient Active Problem List   Diagnosis Date Noted   Bulging of lumbar intervertebral disc 06/16/2022   Fibromyalgia syndrome 03/09/2022   Chronic back pain 03/09/2022   Excessive daytime sleepiness 11/28/2021   Endometriosis determined by laparoscopy 05/08/2021   Myalgia 03/11/2021   Elevated BP without diagnosis of hypertension 01/08/2021   Dichorionic diamniotic twin pregnancy in third trimester 08/14/2020   Preeclampsia 08/14/2020   Twin pregnancy delivered vaginally 08/14/2020   Preterm labor 06/17/2020   Right trigeminal neuralgia 11/08/2019   Endometriosis 03/27/2019   PCOS (polycystic ovarian syndrome) 03/27/2019   Moderate persistent asthma 03/07/2019   Post-nasal drainage 03/07/2019   Irritable bowel syndrome with diarrhea 03/07/2019    Gastroesophageal reflux disease without esophagitis 03/07/2019   Class 1 obesity due to excess calories with serious comorbidity and body mass index (BMI) of 34.0 to 34.9 in adult 09/02/2018   Bipolar 2 disorder (HCC) 08/12/2018   History of low birth weight 03/11/2018   Vulvar atrophy 03/11/2018   History of migraine headaches 05/01/2015   Anxiety and depression 05/01/2015    Past Surgical History:  Procedure Laterality Date   ABDOMINAL HYSTERECTOMY     FRACTURE SURGERY     ankle surgery with impant    HYSTEROSCOPY WITH D & C N/A 09/22/2019   Procedure: DILATATION AND CURETTAGE /HYSTEROSCOPY;  Surgeon: Ward, Elenora Fender, MD;  Location: [facility];  Service: Gynecology;  Laterality: N/A;   LYSIS OF ADHESION N/A 05/08/2021   Procedure: LYSIS OF ADHESION, EXCISION OF ENDOMETRIOSIS;  Surgeon: Christeen Douglas, MD;  Location: [facility];  Service: Gynecology;  Laterality: N/A;   ROBOTIC ASSISTED DIAGNOSTIC LAPAROSCOPY N/A 02/18/2017   Procedure: ROBOTIC ASSISTED DIAGNOSTIC LAPAROSCOPY,EXCISION AND ABLATION OF ENDOMETRIOSIS,LYSIS OF ADHESIONS;  Surgeon: Olivia Mackie, MD;  Location: [facility];  Service: Gynecology;  Laterality: N/A;   ROBOTIC ASSISTED LAPAROSCOPIC HYSTERECTOMY AND SALPINGECTOMY Bilateral 05/08/2021   Procedure: XI ROBOTIC ASSISTED TOTAL LAPAROSCOPIC HYSTERECTOMY AND SALPINGECTOMY;  Surgeon: Christeen Douglas, MD;  Location: [facility];  Service: Gynecology;  Laterality: Bilateral;   VAGINAL DELIVERY N/A 08/14/2020   Procedure: VAGINAL  DELIVERY;  Surgeon: Schermerhorn, Ihor Austin, MD;  Location: [facility];  Service: Obstetrics;  Laterality: N/A;   WISDOM TOOTH EXTRACTION  2013    OB History     Gravida  2   Para  2   Term  1   Preterm  1   AB  0   Living  3      SAB  0   IAB  0   Ectopic  0   Multiple  1   Live Births  3            Home Medications    Prior to Admission medications   Medication Sig Start Date End Date Taking? Authorizing Provider   predniSONE (DELTASONE) 20 MG tablet Take 2 tablets (40 mg total) by mouth daily for 5 days. 06/28/22 07/03/22 Yes , Acie Fredrickson, FNP  acetaminophen (TYLENOL) 500 MG tablet Take 2 tablets (1,000 mg total) by mouth every 6 (six) hours as needed for moderate pain. 07/11/20   McVey, Prudencio Pair, CNM  albuterol (VENTOLIN HFA) 108 (90 Base) MCG/ACT inhaler Inhale 1-2 puffs into the lungs every 6 (six) hours as needed for wheezing or shortness of breath. 10/09/21   Berniece Salines, FNP  ALPRAZolam Prudy Feeler) 0.25 MG tablet Take 0.25 mg by mouth at bedtime as needed for anxiety.    [provider]  Ascorbic Acid (VITAMIN C) 100 MG tablet Take 100 mg by mouth daily.    [provider]  cholecalciferol (VITAMIN D3) 25 MCG (1000 UNIT) tablet Take 1,000 Units by mouth daily.    [provider]  dextroamphetamine (DEXEDRINE SPANSULE) 10 MG 24 hr capsule Dexedrine Spansule    [provider]  famotidine (PEPCID) 20 MG tablet Take 1 tablet (20 mg total) by mouth at bedtime. 02/24/22   Berniece Salines, FNP  gabapentin (NEURONTIN) 100 MG capsule Take 400 mg by mouth at bedtime. 02/22/22   [provider]  ibuprofen (ADVIL) 800 MG tablet Take 1 tablet (800 mg total) by mouth every 8 (eight) hours as needed. 11/10/21   Berniece Salines, FNP  lamoTRIgine (LAMICTAL) 200 MG tablet Take 200 mg by mouth at bedtime. 11/22/21   [provider]  lurasidone (LATUDA) 20 MG TABS tablet Take 1 tablet (20 mg total) by mouth daily. 04/15/21   Gabriel Cirri, NP  MAGNESIUM MALATE PO Take 1 tablet by mouth daily at 6 (six) AM.    [provider]  meclizine (ANTIVERT) 25 MG tablet Take 1 tablet (25 mg total) by mouth 3 (three) times daily as needed for dizziness. 12/05/21   Anson Fret, MD  Multiple Vitamin (MULTIVITAMIN ADULT PO) Take 1 tablet by mouth daily.    [provider]  omeprazole (PRILOSEC) 20 MG capsule Take 1 capsule (20 mg total) by mouth daily. 02/24/22   Berniece Salines, FNP  ondansetron (ZOFRAN-ODT) 4 MG disintegrating tablet Take 1-2 tablets (4-8 mg total) by mouth every 8 (eight) hours as needed. For nausea or dizziness 12/05/21   Anson Fret, MD  phenol (CHLORASEPTIC) 1.4 % LIQD Use as directed 1 spray in the mouth or throat as needed for throat irritation / pain. 03/16/22   Gustavus Bryant, FNP  propranolol (INDERAL) 10 MG tablet Take 1 tablet (10 mg total) by mouth 2 (two) times daily. Patient taking differently: Take 20 mg by mouth 2 (two) times daily. 01/07/21   Danelle Berry, PA-C  propranolol (INDERAL) 20 MG tablet Take by mouth. 02/19/22  [provider]  Semaglutide-Weight Management 0.25 MG/0.5ML SOAJ Inject 0.25 mg into the skin once a week for 28 days. 06/16/22 07/14/22  Berniece Salines, FNP  tiZANidine (ZANAFLEX) 4 MG tablet Take 2-4 mg by mouth at bedtime as needed. 02/27/22   [provider]  vitamin B-12 (CYANOCOBALAMIN) 500 MCG tablet Take 500 mcg by mouth daily.    [provider]  VYVANSE 20 MG capsule Take 20 mg by mouth every morning. 02/23/22   [provider]    Family History Family History  Problem Relation Age of Onset   Endometriosis Mother    Bipolar disorder Mother    Diabetes Mother        prediabetes   Breast cancer Paternal Grandmother    Cancer Paternal Grandmother    Depression Paternal Grandmother    Schizophrenia Father    Depression Father    Heart disease Neg Hx    Colon cancer Neg Hx    Ovarian cancer Neg Hx     Social History Social History   Tobacco Use   Smoking status: Former    Years: 1.00    Types: Cigarettes    Quit date: 12/20/2009    Years since quitting: 12.5   Smokeless tobacco: Never   Tobacco comments:    age [age], quit, 1 cig daily  Vaping Use   Vaping Use: Never used  Substance Use Topics   Alcohol use: Not Currently   Drug use: No     Allergies   Amoxicillin, Other, and Latex   Review of Systems Review of Systems Per HPI  Physical  Exam Triage Vital Signs ED Triage Vitals  Enc Vitals Group     BP 06/28/22 1102 120/89     Pulse Rate 06/28/22 1102 69     Resp 06/28/22 1102 16     Temp 06/28/22 1102 98 F (36.7 C)     Temp Source 06/28/22 1102 Oral     SpO2 06/28/22 1102 97 %     Weight --      Height --      Head Circumference --      Peak Flow --      Pain Score 06/28/22 1100 3     Pain Loc --      Pain Edu? --      Excl. in GC? --    No data found.  Updated Vital Signs BP 120/89 (BP Location: Left Arm)   Pulse 69   Temp 98 F (36.7 C) (Oral)   Resp 16   LMP 04/08/2021   SpO2 97%   Breastfeeding No   Visual Acuity Right Eye Distance:   Left Eye Distance:   Bilateral Distance:    Right Eye Near:   Left Eye Near:    Bilateral Near:     Physical Exam Constitutional:      General: She is not in acute distress.    Appearance: Normal appearance. She is not toxic-appearing or diaphoretic.  HENT:     Head: Normocephalic and atraumatic.     Right Ear: Tympanic membrane and ear canal normal.     Left Ear: Tympanic membrane and ear canal normal.     Nose: No congestion.     Mouth/Throat:     Mouth: Mucous membranes are moist.     Pharynx: No posterior oropharyngeal erythema.  Eyes:     Extraocular Movements: Extraocular movements intact.     Conjunctiva/sclera: Conjunctivae normal.     Pupils: Pupils  are equal, round, and reactive to light.  Cardiovascular:     Rate and Rhythm: Normal rate and regular rhythm.     Pulses: Normal pulses.     Heart sounds: Normal heart sounds.  Pulmonary:     Effort: Pulmonary effort is normal. No respiratory distress.     Breath sounds: Normal breath sounds. No stridor. No wheezing, rhonchi or rales.  Abdominal:     General: Abdomen is flat. Bowel sounds are normal.     Palpations: Abdomen is soft.  Musculoskeletal:        General: Normal range of motion.     Cervical back: Normal range of motion.  Skin:    General: Skin is warm and dry.  Neurological:      General: No focal deficit present.     Mental Status: She is alert and oriented to person, place, and time. Mental status is at baseline.  Psychiatric:        Mood and Affect: Mood normal.        Behavior: Behavior normal.      UC Treatments / Results  Labs (all labs ordered are listed, but only abnormal results are displayed) Labs Reviewed - No data to display  EKG   Radiology DG Chest 2 View  Result Date: 06/28/2022 CLINICAL DATA:  Cough for 3 weeks EXAM: CHEST - 2 VIEW COMPARISON:  10/08/2017 FINDINGS: The heart size and mediastinal contours are within normal limits. Both lungs are clear. The visualized skeletal structures are unremarkable. IMPRESSION: No active cardiopulmonary disease. Electronically Signed   By: Tiburcio Pea M.D.   On: 06/28/2022 11:31    Procedures Procedures (including critical care time)  Medications Ordered in UC Medications - No data to display  Initial Impression / Assessment and Plan / UC Course  I have reviewed the triage vital signs and the nursing notes.  Pertinent labs & imaging results that were available during my care of the patient were reviewed by me and considered in my medical decision making (see chart for details).     X-ray was negative for any acute cardiopulmonary process.  Suspect possible viral bronchitis versus asthma exacerbation.  Do not think antibiotics are necessary given physical exam and patient symptoms.  Will treat with prednisone to alleviate inflammation associated with symptoms.  Patient received steroid injection into spine a few days ago but I do think low-dose and short course of prednisone should be safe for patient as she has taken this before and tolerated well.  Patient was given strict return precautions.  Patient verbalized understanding and was agreeable with plan. Final Clinical Impressions(s) / UC Diagnoses   Final diagnoses:  Persistent cough     Discharge Instructions      Chest x-ray was  normal as we discussed.  I am prescribing you prednisone given your history of asthma and persistent cough.  Highly suspicious of bronchitis as we discussed.  Please follow-up if symptoms persist or worsen.    ED Prescriptions     Medication Sig Dispense Auth. Provider   predniSONE (DELTASONE) 20 MG tablet Take 2 tablets (40 mg total) by mouth daily for 5 days. 10 tablet Gustavus Bryant, [place]      PDMP not reviewed this encounter.   Gustavus Bryant, [place] 06/28/22 1217

## 2022-06-30 ENCOUNTER — Ambulatory Visit: Payer: Medicaid Other

## 2022-07-01 DIAGNOSIS — F411 Generalized anxiety disorder: Secondary | ICD-10-CM | POA: Diagnosis not present

## 2022-07-01 DIAGNOSIS — F4312 Post-traumatic stress disorder, chronic: Secondary | ICD-10-CM | POA: Diagnosis not present

## 2022-07-01 DIAGNOSIS — F330 Major depressive disorder, recurrent, mild: Secondary | ICD-10-CM | POA: Diagnosis not present

## 2022-07-02 DIAGNOSIS — F900 Attention-deficit hyperactivity disorder, predominantly inattentive type: Secondary | ICD-10-CM | POA: Diagnosis not present

## 2022-07-04 ENCOUNTER — Ambulatory Visit (HOSPITAL_COMMUNITY)
Admission: EM | Admit: 2022-07-04 | Discharge: 2022-07-04 | Disposition: A | Discharge status: Home / Self Care | Payer: Medicaid Other | Attending: Emergency Medicine | Admitting: Emergency Medicine

## 2022-07-04 ENCOUNTER — Encounter (HOSPITAL_COMMUNITY): Payer: Self-pay

## 2022-07-04 DIAGNOSIS — J4521 Mild intermittent asthma with (acute) exacerbation: Secondary | ICD-10-CM | POA: Diagnosis not present

## 2022-07-04 MED ORDER — DOXYCYCLINE HYCLATE 100 MG PO CAPS: 100.0000 mg | ORAL_CAPSULE | Freq: Two times a day (BID) | ORAL | 0 refills | Status: DC

## 2022-07-04 MED ORDER — ALBUTEROL SULFATE (2.5 MG/3ML) 0.083% IN NEBU: 2.5000 mg | INHALATION_SOLUTION | Freq: Four times a day (QID) | RESPIRATORY_TRACT | 12 refills | Status: DC | PRN

## 2022-07-04 MED ORDER — PREDNISONE 20 MG PO TABS: 40.0000 mg | ORAL_TABLET | Freq: Every day | ORAL | 0 refills | Status: DC

## 2022-07-04 MED ORDER — BENZONATATE 100 MG PO CAPS: 100.0000 mg | ORAL_CAPSULE | Freq: Three times a day (TID) | ORAL | 0 refills | Status: DC

## 2022-07-04 MED ORDER — PROMETHAZINE-DM 6.25-15 MG/5ML PO SYRP: 5.0000 mL | ORAL_SOLUTION | Freq: Four times a day (QID) | ORAL | 0 refills | Status: DC | PRN

## 2022-07-04 NOTE — ED Provider Notes (Signed)
[facility]    CSN: [id number] Arrival date & time: 07/04/22  1650      History   Chief Complaint Chief Complaint  Patient presents with   Cough   Wheezing    asthma    HPI Janet Rich is a 26 y.o. female.   Patient presents with a postnasal drip, productive cough, shortness of breath at rest worsened by coughing and wheezing throughout the day for 2 weeks.  Dors is symptoms worsen last night and she had a coughing fit that left her gasping for breath, took several minutes before she was able to breathe comfortably. symptoms began after son caught viral illness.  Was evaluated in urgent care approximately 1 weeks ago, attempted use of steroids, has been ineffective.  Tolerating food and liquids.  Denies fever, chills, body aches, nasal congestion, sore throat.  Past Medical History:  Diagnosis Date   Anemia    with periods   Ankle fracture    right   Anxiety    Asthma    Back pain    Bipolar 1 disorder (HCC)    Depression    Bipolar    Endometriosis    Fibromyalgia    GERD (gastroesophageal reflux disease)    Headache    MIGRAINES   IBS (irritable bowel syndrome)    Ovarian cyst    PCOS (polycystic ovarian syndrome)    Pneumonia    age 58   PONV (postoperative nausea and vomiting)    nausea after wisdom teeth extraction   Scoliosis    Vaginal Pap smear, abnormal    bx ok    Patient Active Problem List   Diagnosis Date Noted   Bulging of lumbar intervertebral disc 06/16/2022   Fibromyalgia syndrome 03/09/2022   Chronic back pain 03/09/2022   Excessive daytime sleepiness 11/28/2021   Endometriosis determined by laparoscopy 05/08/2021   Myalgia 03/11/2021   Elevated BP without diagnosis of hypertension 01/08/2021   Dichorionic diamniotic twin pregnancy in third trimester 08/14/2020   Preeclampsia 08/14/2020   Twin pregnancy delivered vaginally 08/14/2020   Preterm labor 06/17/2020   Right trigeminal neuralgia 11/08/2019   Endometriosis  03/27/2019   PCOS (polycystic ovarian syndrome) 03/27/2019   Moderate persistent asthma 03/07/2019   Post-nasal drainage 03/07/2019   Irritable bowel syndrome with diarrhea 03/07/2019   Gastroesophageal reflux disease without esophagitis 03/07/2019   Class 1 obesity due to excess calories with serious comorbidity and body mass index (BMI) of 34.0 to 34.9 in adult 09/02/2018   Bipolar 2 disorder ([place]) 08/12/2018   History of low birth weight 03/11/2018   Vulvar atrophy 03/11/2018   History of migraine headaches 05/01/2015   Anxiety and depression 05/01/2015    Past Surgical History:  Procedure Laterality Date   ABDOMINAL HYSTERECTOMY     FRACTURE SURGERY     ankle surgery with impant    HYSTEROSCOPY WITH D & C N/A 09/22/2019   Procedure: DILATATION AND CURETTAGE /HYSTEROSCOPY;  Surgeon: Ward, Honor Loh, MD;  Location: [facility];  Service: Gynecology;  Laterality: N/A;   LYSIS OF ADHESION N/A 05/08/2021   Procedure: LYSIS OF ADHESION, EXCISION OF ENDOMETRIOSIS;  Surgeon: Benjaman Kindler, MD;  Location: [facility];  Service: Gynecology;  Laterality: N/A;   ROBOTIC ASSISTED DIAGNOSTIC LAPAROSCOPY N/A 02/18/2017   Procedure: ROBOTIC ASSISTED DIAGNOSTIC LAPAROSCOPY,EXCISION AND ABLATION OF ENDOMETRIOSIS,LYSIS OF ADHESIONS;  Surgeon: Brien Few, MD;  Location: [facility];  Service: Gynecology;  Laterality: N/A;   ROBOTIC ASSISTED LAPAROSCOPIC HYSTERECTOMY AND SALPINGECTOMY Bilateral 05/08/2021  Procedure: XI ROBOTIC ASSISTED TOTAL LAPAROSCOPIC HYSTERECTOMY AND SALPINGECTOMY;  Surgeon: Christeen Douglas, MD;  Location: [facility];  Service: Gynecology;  Laterality: Bilateral;   VAGINAL DELIVERY N/A 08/14/2020   Procedure: VAGINAL DELIVERY;  Surgeon: Feliberto Gottron Ihor Austin, MD;  Location: [facility];  Service: Obstetrics;  Laterality: N/A;   WISDOM TOOTH EXTRACTION  2013    OB History     Gravida  2   Para  2   Term  1   Preterm  1   AB  0   Living  3      SAB  0   IAB  0    Ectopic  0   Multiple  1   Live Births  3            Home Medications    Prior to Admission medications   Medication Sig Start Date End Date Taking? Authorizing Provider  albuterol (VENTOLIN HFA) 108 (90 Base) MCG/ACT inhaler Inhale 1-2 puffs into the lungs every 6 (six) hours as needed for wheezing or shortness of breath. 10/09/21  Yes Berniece Salines, FNP  ALPRAZolam Prudy Feeler) 0.25 MG tablet Take 0.25 mg by mouth at bedtime as needed for anxiety.   Yes [provider]  Ascorbic Acid (VITAMIN C) 100 MG tablet Take 100 mg by mouth daily.   Yes [provider]  cholecalciferol (VITAMIN D3) 25 MCG (1000 UNIT) tablet Take 1,000 Units by mouth daily.   Yes [provider]  dextroamphetamine (DEXEDRINE SPANSULE) 10 MG 24 hr capsule Dexedrine Spansule   Yes [provider]  famotidine (PEPCID) 20 MG tablet Take 1 tablet (20 mg total) by mouth at bedtime. 02/24/22  Yes Berniece Salines, FNP  gabapentin (NEURONTIN) 100 MG capsule Take 400 mg by mouth at bedtime. 02/22/22  Yes [provider]  ibuprofen (ADVIL) 800 MG tablet Take 1 tablet (800 mg total) by mouth every 8 (eight) hours as needed. 11/10/21  Yes Berniece Salines, FNP  lamoTRIgine (LAMICTAL) 200 MG tablet Take 200 mg by mouth at bedtime. 11/22/21  Yes [provider]  lurasidone (LATUDA) 20 MG TABS tablet Take 1 tablet (20 mg total) by mouth daily. 04/15/21  Yes Gabriel Cirri, NP  MAGNESIUM MALATE PO Take 1 tablet by mouth daily at 6 (six) AM.   Yes [provider]  Multiple Vitamin (MULTIVITAMIN ADULT PO) Take 1 tablet by mouth daily.   Yes [provider]  omeprazole (PRILOSEC) 20 MG capsule Take 1 capsule (20 mg total) by mouth daily. 02/24/22  Yes Berniece Salines, FNP  propranolol (INDERAL) 20 MG tablet Take by mouth. 02/19/22  Yes [provider]  vitamin B-12 (CYANOCOBALAMIN) 500 MCG tablet Take 500 mcg by mouth daily.   Yes [provider]   acetaminophen (TYLENOL) 500 MG tablet Take 2 tablets (1,000 mg total) by mouth every 6 (six) hours as needed for moderate pain. 07/11/20   McVey, Prudencio Pair, CNM  ondansetron (ZOFRAN-ODT) 4 MG disintegrating tablet Take 1-2 tablets (4-8 mg total) by mouth every 8 (eight) hours as needed. For nausea or dizziness 12/05/21   Anson Fret, MD  Semaglutide-Weight Management 0.25 MG/0.5ML SOAJ Inject 0.25 mg into the skin once a week for 28 days. 06/16/22 07/14/22  Berniece Salines, FNP    Family History Family History  Problem Relation Age of Onset   Endometriosis Mother    Bipolar disorder Mother    Diabetes Mother        prediabetes  Breast cancer Paternal Grandmother    Cancer Paternal Grandmother    Depression Paternal Grandmother    Schizophrenia Father    Depression Father    Heart disease Neg Hx    Colon cancer Neg Hx    Ovarian cancer Neg Hx     Social History Social History   Tobacco Use   Smoking status: Former    Years: 1.00    Types: Cigarettes    Quit date: 12/20/2009    Years since quitting: 12.5   Smokeless tobacco: Never   Tobacco comments:    age [age], quit, 1 cig daily  Vaping Use   Vaping Use: Never used  Substance Use Topics   Alcohol use: Not Currently   Drug use: No     Allergies   Amoxicillin, Other, and Latex   Review of Systems Review of Systems  Constitutional: Negative.   HENT:  Positive for postnasal drip. Negative for congestion, dental problem, drooling, ear discharge, ear pain, facial swelling, hearing loss, mouth sores, nosebleeds, rhinorrhea, sinus pressure, sinus pain, sneezing, sore throat, tinnitus, trouble swallowing and voice change.   Respiratory:  Positive for cough, shortness of breath and wheezing. Negative for apnea, choking, chest tightness and stridor.   Cardiovascular: Negative.   Gastrointestinal: Negative.   Skin: Negative.      Physical Exam Triage Vital Signs ED Triage Vitals  Enc Vitals Group     BP 07/04/22  1754 124/64     Pulse Rate 07/04/22 1754 67     Resp 07/04/22 1754 16     Temp 07/04/22 1754 98 F (36.7 C)     Temp Source 07/04/22 1754 Oral     SpO2 07/04/22 1754 95 %     Weight --      Height --      Head Circumference --      Peak Flow --      Pain Score 07/04/22 1755 4     Pain Loc --      Pain Edu? --      Excl. in GC? --    No data found.  Updated Vital Signs BP 124/64 (BP Location: Left Arm)   Pulse 67   Temp 98 F (36.7 C) (Oral)   Resp 16   LMP 04/08/2021   SpO2 95%   Breastfeeding No   Visual Acuity Right Eye Distance:   Left Eye Distance:   Bilateral Distance:    Right Eye Near:   Left Eye Near:    Bilateral Near:     Physical Exam Constitutional:      Appearance: Normal appearance.  HENT:     Head: Normocephalic.  Eyes:     Extraocular Movements: Extraocular movements intact.  Cardiovascular:     Rate and Rhythm: Normal rate and regular rhythm.     Pulses: Normal pulses.     Heart sounds: Normal heart sounds.  Pulmonary:     Effort: Pulmonary effort is normal.     Breath sounds: Normal breath sounds.  Neurological:     Mental Status: She is alert and oriented to person, place, and time. Mental status is at baseline.  Psychiatric:        Mood and Affect: Mood normal.        Behavior: Behavior normal.      UC Treatments / Results  Labs (all labs ordered are listed, but only abnormal results are displayed) Labs Reviewed - No data to display  EKG   Radiology No results  found.  Procedures Procedures (including critical care time)  Medications Ordered in UC Medications - No data to display  Initial Impression / Assessment and Plan / UC Course  I have reviewed the triage vital signs and the nursing notes.  Pertinent labs & imaging results that were available during my care of the patient were reviewed by me and considered in my medical decision making (see chart for details).  Mild intermittent asthma with exacerbation  Vital  signs are stable patient is in no signs of distress, O2 saturation 95% on room air and lungs are clear to auscultation, as she has had symptoms for 2 weeks we will provide bacterial coverage, doxycycline prescribed, penicillin allergy confirmed, prescribed prednisone, Tessalon and promethazine DM for additional supportive measures, refilled albuterol nebulizers may use additional over-the-counter medications as needed for supportive care, may follow-up with urgent care if symptoms continue to persist Final Clinical Impressions(s) / UC Diagnoses   Final diagnoses:  None   Discharge Instructions   None    ED Prescriptions   None    PDMP not reviewed this encounter.   Valinda Hoar, NP 07/05/22 1013

## 2022-07-04 NOTE — ED Triage Notes (Signed)
Patient has history of asthma. Coughing for 2 weeks. Was seen last week and given steroids for the cough. Cough has gotten worse, last night had a coughing spell causing SOB. Patient states she has wheezing and barking cough.   Patient son is sick and diagnosed with a respiratory infection. Negative home COVID test, no other testing.

## 2022-07-04 NOTE — Discharge Instructions (Signed)
Today you are being treated for inflammation to your upper airways is most likely being flared from a germ as it started after exposure to your sick child  Take doxycycline every morning and every evening for 10 days  Begin use of prednisone every morning with food for 5 days to help reduce inflammation   May use albuterol  nebulizer every 4 hours as needed to help calm shortness of breath and wheezing  Use Tessalon pill every 8 hours to help calm your coughing  May use promethazine DM for coughing and additional comfort, be mindful this medication may make you drowsy, half dose for child  For worsening signs of breathing please go to the nearest emergency department for evaluation  In addition:  Maintaining adequate hydration may help to thin secretions and soothe the respiratory mucosa   Warm Liquids- Ingestion of warm liquids may have a soothing effect on the respiratory mucosa, increase the flow of nasal mucus, and loosen respiratory secretions, making them easier to remove  May try honey (2.5 to 5 mL [0.5 to 1 teaspoon]) can be given straight or diluted in liquid (juice). Corn syrup may be substituted if honey is not available.

## 2022-07-05 DIAGNOSIS — F319 Bipolar disorder, unspecified: Secondary | ICD-10-CM | POA: Diagnosis not present

## 2022-07-05 DIAGNOSIS — Z9181 History of falling: Secondary | ICD-10-CM | POA: Diagnosis not present

## 2022-07-05 DIAGNOSIS — Z6838 Body mass index (BMI) 38.0-38.9, adult: Secondary | ICD-10-CM | POA: Diagnosis not present

## 2022-07-05 DIAGNOSIS — Z803 Family history of malignant neoplasm of breast: Secondary | ICD-10-CM | POA: Diagnosis not present

## 2022-07-05 DIAGNOSIS — Z811 Family history of alcohol abuse and dependence: Secondary | ICD-10-CM | POA: Diagnosis not present

## 2022-07-05 DIAGNOSIS — J45909 Unspecified asthma, uncomplicated: Secondary | ICD-10-CM | POA: Diagnosis not present

## 2022-07-05 DIAGNOSIS — Z8249 Family history of ischemic heart disease and other diseases of the circulatory system: Secondary | ICD-10-CM | POA: Diagnosis not present

## 2022-07-05 DIAGNOSIS — Z818 Family history of other mental and behavioral disorders: Secondary | ICD-10-CM | POA: Diagnosis not present

## 2022-07-05 DIAGNOSIS — Z91040 Latex allergy status: Secondary | ICD-10-CM | POA: Diagnosis not present

## 2022-07-05 DIAGNOSIS — Z7951 Long term (current) use of inhaled steroids: Secondary | ICD-10-CM | POA: Diagnosis not present

## 2022-07-05 DIAGNOSIS — K219 Gastro-esophageal reflux disease without esophagitis: Secondary | ICD-10-CM | POA: Diagnosis not present

## 2022-07-05 DIAGNOSIS — R32 Unspecified urinary incontinence: Secondary | ICD-10-CM | POA: Diagnosis not present

## 2022-07-05 DIAGNOSIS — R112 Nausea with vomiting, unspecified: Secondary | ICD-10-CM | POA: Diagnosis not present

## 2022-07-05 DIAGNOSIS — M797 Fibromyalgia: Secondary | ICD-10-CM | POA: Diagnosis not present

## 2022-07-05 DIAGNOSIS — F902 Attention-deficit hyperactivity disorder, combined type: Secondary | ICD-10-CM | POA: Diagnosis not present

## 2022-07-05 DIAGNOSIS — M1990 Unspecified osteoarthritis, unspecified site: Secondary | ICD-10-CM | POA: Diagnosis not present

## 2022-07-05 DIAGNOSIS — F4310 Post-traumatic stress disorder, unspecified: Secondary | ICD-10-CM | POA: Diagnosis not present

## 2022-07-05 DIAGNOSIS — F411 Generalized anxiety disorder: Secondary | ICD-10-CM | POA: Diagnosis not present

## 2022-07-06 DIAGNOSIS — M65871 Other synovitis and tenosynovitis, right ankle and foot: Secondary | ICD-10-CM | POA: Diagnosis not present

## 2022-07-06 DIAGNOSIS — M25472 Effusion, left ankle: Secondary | ICD-10-CM | POA: Diagnosis not present

## 2022-07-06 DIAGNOSIS — S92251A Displaced fracture of navicular [scaphoid] of right foot, initial encounter for closed fracture: Secondary | ICD-10-CM | POA: Diagnosis not present

## 2022-07-06 DIAGNOSIS — S93491S Sprain of other ligament of right ankle, sequela: Secondary | ICD-10-CM | POA: Diagnosis not present

## 2022-07-06 DIAGNOSIS — S93492S Sprain of other ligament of left ankle, sequela: Secondary | ICD-10-CM | POA: Diagnosis not present

## 2022-07-06 DIAGNOSIS — M25871 Other specified joint disorders, right ankle and foot: Secondary | ICD-10-CM | POA: Diagnosis not present

## 2022-07-06 DIAGNOSIS — M25371 Other instability, right ankle: Secondary | ICD-10-CM | POA: Diagnosis not present

## 2022-07-08 ENCOUNTER — Encounter: Payer: Self-pay | Admitting: Physical Therapy

## 2022-07-08 ENCOUNTER — Ambulatory Visit: Payer: Medicaid Other | Attending: Neurology | Admitting: Physical Therapy

## 2022-07-08 DIAGNOSIS — M6281 Muscle weakness (generalized): Secondary | ICD-10-CM | POA: Diagnosis not present

## 2022-07-08 DIAGNOSIS — R2681 Unsteadiness on feet: Secondary | ICD-10-CM | POA: Diagnosis not present

## 2022-07-08 DIAGNOSIS — M545 Low back pain, unspecified: Secondary | ICD-10-CM | POA: Insufficient documentation

## 2022-07-08 DIAGNOSIS — M542 Cervicalgia: Secondary | ICD-10-CM | POA: Insufficient documentation

## 2022-07-08 DIAGNOSIS — R42 Dizziness and giddiness: Secondary | ICD-10-CM | POA: Diagnosis not present

## 2022-07-08 DIAGNOSIS — R278 Other lack of coordination: Secondary | ICD-10-CM | POA: Diagnosis not present

## 2022-07-08 NOTE — Therapy (Signed)
OUTPATIENT PHYSICAL THERAPY TREATMENT NOTE/ RE-AUTHORIZATION   Patient Name: Janet Rich MRN: [id number] DOB:1996-07-01, 26 y.o., female Today's Date: 07/08/2022  PCP: Bo Merino, FNP REFERRING PROVIDER: Meeler, Sherren Kerns, FNP  END OF SESSION:   PT End of Session - 07/08/22 1502     Visit Number 14    Date for PT Re-Evaluation 07/22/22    Authorization Type [employer] (VL 27) - pending additional auth    Authorization Time Period 8 visits approved 06/01/22-07/02/22    Authorization - Visit Number 14    Authorization - Number of Visits 19    PT Start Time 1500    PT Stop Time 1545    PT Time Calculation (min) 45 min    Activity Tolerance Patient tolerated treatment well    Behavior During Therapy WFL for tasks assessed/performed              Past Medical History:  Diagnosis Date   Anemia    with periods   Ankle fracture    right   Anxiety    Asthma    Back pain    Bipolar 1 disorder (HCC)    Depression    Bipolar    Endometriosis    Fibromyalgia    GERD (gastroesophageal reflux disease)    Headache    MIGRAINES   IBS (irritable bowel syndrome)    Ovarian cyst    PCOS (polycystic ovarian syndrome)    Pneumonia    age 57   PONV (postoperative nausea and vomiting)    nausea after wisdom teeth extraction   Scoliosis    Vaginal Pap smear, abnormal    bx ok   Past Surgical History:  Procedure Laterality Date   ABDOMINAL HYSTERECTOMY     FRACTURE SURGERY     ankle surgery with impant    HYSTEROSCOPY WITH D & C N/A 09/22/2019   Procedure: DILATATION AND CURETTAGE /HYSTEROSCOPY;  Surgeon: Ward, Honor Loh, MD;  Location: [facility];  Service: Gynecology;  Laterality: N/A;   LYSIS OF ADHESION N/A 05/08/2021   Procedure: LYSIS OF ADHESION, EXCISION OF ENDOMETRIOSIS;  Surgeon: Benjaman Kindler, MD;  Location: [facility];  Service: Gynecology;  Laterality: N/A;   ROBOTIC ASSISTED DIAGNOSTIC LAPAROSCOPY N/A 02/18/2017   Procedure: ROBOTIC ASSISTED DIAGNOSTIC  LAPAROSCOPY,EXCISION AND ABLATION OF ENDOMETRIOSIS,LYSIS OF ADHESIONS;  Surgeon: Brien Few, MD;  Location: [facility];  Service: Gynecology;  Laterality: N/A;   ROBOTIC ASSISTED LAPAROSCOPIC HYSTERECTOMY AND SALPINGECTOMY Bilateral 05/08/2021   Procedure: XI ROBOTIC ASSISTED TOTAL LAPAROSCOPIC HYSTERECTOMY AND SALPINGECTOMY;  Surgeon: Benjaman Kindler, MD;  Location: [facility];  Service: Gynecology;  Laterality: Bilateral;   VAGINAL DELIVERY N/A 08/14/2020   Procedure: VAGINAL DELIVERY;  Surgeon: Ouida Sills Gwen Her, MD;  Location: [facility];  Service: Obstetrics;  Laterality: N/A;   [place] EXTRACTION  2013   Patient Active Problem List   Diagnosis Date Noted   Bulging of lumbar intervertebral disc 06/16/2022   Fibromyalgia syndrome 03/09/2022   Chronic back pain 03/09/2022   Excessive daytime sleepiness 11/28/2021   Endometriosis determined by laparoscopy 05/08/2021   Myalgia 03/11/2021   Elevated BP without diagnosis of hypertension 01/08/2021   Dichorionic diamniotic twin pregnancy in third trimester 08/14/2020   Preeclampsia 08/14/2020   Twin pregnancy delivered vaginally 08/14/2020   Preterm labor 06/17/2020   Right trigeminal neuralgia 11/08/2019   Endometriosis 03/27/2019   PCOS (polycystic ovarian syndrome) 03/27/2019   Moderate persistent asthma 03/07/2019   Post-nasal drainage 03/07/2019   Irritable bowel syndrome with diarrhea  03/07/2019   Gastroesophageal reflux disease without esophagitis 03/07/2019   Class 1 obesity due to excess calories with serious comorbidity and body mass index (BMI) of 34.0 to 34.9 in adult 09/02/2018   Bipolar 2 disorder ([place]) 08/12/2018   History of low birth weight 03/11/2018   Vulvar atrophy 03/11/2018   History of migraine headaches 05/01/2015   Anxiety and depression 05/01/2015    REFERRING DIAG: Dorsalgia, unspecified [M54.9], Sciatica, unspecified side [M54.30]  THERAPY DIAG:  Low back pain, unspecified back pain laterality,  unspecified chronicity, unspecified whether sciatica present  Cervicalgia  Muscle weakness  Dizziness and giddiness  Unsteadiness on feet  Other lack of coordination  Rationale for Evaluation and Treatment Rehabilitation  PERTINENT HISTORY: Bipolar, fibromyalgia, headaches, scoliosis, asthma, hysterectomy, anxiety, depression, PTSD   PRECAUTIONS: None  SUBJECTIVE: Patient reports low pain today 4/10   2 weeks post lumbar injection  07-08-22  4/10 PAIN:  Are you having pain? Yes Pain location: low back pain NPRS scale:  Current 7/10  Aggravating factors: bending and lifting, standing (1/2 hour), sitting (1 hour)           NPRS, highest: 7/10 Relieving factors: changing positions, ice, heat, medication, rest Pain description: intermittent, constant, burning, stabbing, and "nerve" Stage: Chronic Stability: staying the same 24 hour pattern: better in the morning, then increasing throughout the day.    OBJECTIVE: (objective measures completed at initial evaluation unless otherwise dated)  DIAGNOSTIC FINDINGS:  None current   GENERAL OBSERVATION/GAIT:           Increased lumbar lordosis, R sided rib elevation in lumbar flexion, increased lumbar lordosis,   SENSATION:          Light touch: Appears intact   MUSCLE LENGTH: Hamstrings: Right no restriction; Left no restriction ASLR: Right ASLR = PSLR; Left ASLR = PSLR Tatsch test: Right significant restriction; Left significant restriction Ely's test: Right significant restriction; Left significant restriction                         Tight piriformis bil       LUMBAR AROM   AROM AROM  04/01/2022 AROM 04/25/2022  Flexion WNL WNL  Extension WNL, w/ concordant pain WNL, w/ concordant pain  Right lateral flexion WNL, w/ concordant pain WNL, minor pain  Left lateral flexion WNL, w/ concordant pain WNL, minor pain  Right rotation limited by 25%, w/ concordant pain WNL  Left rotation limited by 25%, w/ no pain limited by  25%, w/ no pain    (Blank rows = not tested)   DIRECTIONAL PREFERENCE:           none   LE MMT:   MMT Right 04/01/2022 Left 04/01/2022 Right 04/25/2022 Left 04/25/2022  Hip flexion (L2, L3) 4 4 5/5 5/5  Knee extension (L3) 4 4    Knee flexion 4 4    Hip abduction 3+ 4 5/5 4+/5  Hip extension 3+ 4 4/5 5/5  Hip external rotation        Hip internal rotation        Hip adduction        Ankle dorsiflexion (L4)        Ankle plantarflexion (S1)        Ankle inversion        Ankle eversion        Great Toe ext (L5)        Grossly          (  Blank rows = not tested, score listed is out of 5 possible points.  N = WNL, D = diminished, C = clear for gross weakness with myotome testing, * = concordant pain with testing)     LE ROM:   ROM Right 04/01/2022 Left 04/01/2022  Hip flexion 90 90  Hip extension limited limited  Hip abduction      Hip adduction      Hip internal rotation limited limited  Hip external rotation      Knee flexion      Knee extension      Ankle dorsiflexion      Ankle plantarflexion      Ankle inversion      Ankle eversion         (Blank rows = not tested, N = WNL, * = concordant pain with testing)   Functional Tests   Eval (04/01/2022)  04/25/2022    Sustained supine bridge (dominant leg extended at 120'', if reached): 19'' (norm 170'')    35"    Standard plank from knees: 27'' (norm 70'' from feet)  20" from feet                                                                                             LUMBAR SPECIAL TESTS:  Straight leg raise: L (-), R (-) Slump: L (-), R (-) SI: fortin's sign, compression, sacral thrust (+)    PALPATION:            TTP bil PSIS, sacrum, TTP lumbar paraspinals L1-L5    SPINAL SEGMENTAL MOBILITY ASSESSMENT:  Painful, increase lordosis lumbar spine   PATIENT SURVEYS:  Modified Oswestry 29->58% disability  04/25/2022: 29 ->58%   HOME EXERCISE PROGRAM: Access Code: [email or web address] URL:  [email or web address] Date: 04/01/2022 Prepared by: Alphonzo Severance   Exercises - Hip Flexor Stretch at [facility]  - 2 x daily - 7 x weekly - 2 sets - 45 second hold - Supine Posterior Pelvic Tilt  - 2 x daily - 7 x weekly - 2 sets - 10 reps - 5'' hold - Supine Hip Adduction Isometric with Ball  - 1 x daily - 7 x weekly - 1 sets - 10 reps - 10'' hold - Supine Bridge  - 1 x daily - 7 x weekly - 2 sets - 10 reps - 5 second hold hold  Added 04/15/2022: - Marching Bridge  - 1 x daily - 7 x weekly - 3 sets - 20 reps - Prone Swimmer  - 1 x daily - 7 x weekly - 3 sets - 20 reps - Plank on Knees  - 1 x daily - 7 x weekly - 3 sets - to failure hold   ASTERISK SIGNS     Asterisk Signs Eval (04/01/2022) 8/9          piriformis Sig restriction            Hip flexors Sig restriction Moderate restriction           bridge 19'' 50''           Plank from knees 27''  13''          R hip ext and abd MMT 3+                  TODAY'S TREATMENT  [facility] Adult PT Treatment:                                                DATE: 07-08-22 Aquatic therapy at [address] - therapeutic pool temp 91 degrees Pt enters building independently.  Treatment took place in water 3.8 to  4 ft 8 in.feet deep depending upon activity.  Pt entered and exited the pool via stair and handrails independently.   Pt pain level 7/10 at initiation of water walking.  Therapeutic Exercise: Walking forward/backwards/side stepping with yellow DB by sides Runners stretch x30" BIL Hamstring stretch x30" BIL Figure 4 squat stretch, BIL UE support 2x30" BIL Groin stretch BIL 30 " Standing thoracic rotation with noodle x1' Cat/cow with noodle x20 Hip flexor stretch with noodle x1' BIL added  PPT Standing IR/ER BIL Pt  educated on neutral posture and hip hinging in seated position with water at chest level x 10 with stretch to low back and then x 10 with back at pool wall at external cue, VC for neck  tucked to prevent hyperextension.  At edge of pool, pt performed LE exercise: Hip abd/add x20 BIL Hip ext/flex with knee straight x 20 BIL Marching hip flexion to knee extension x20 BIL Squats 2x20 Heel/toe raises x20  Groin stretch BIL 30 " In 4'8 " deep water for greater LE AROM   Water treading 2 minutes Bicycle kicks x1' Reverse bicycle kicks x1' Flutter kicks x1' Scissor kicks x 1'  Bad Ragaz, Pt with pool noodle under arms and nek doodle for neck support.   Pt assisted into supine floating position by lying head on shoulder of PT to get into floating position. . PT at torso and assisting with trunk left to right and vice versa to engage trunk muscles. PT then rotated trunk in order to engage abdominal (internal and external obliques) Emphasis on breathing techniques to draw in abdominals for support.  Pt then utilizing posterior chain and engaging Hip extension and knee flexion with water resistance while PT used Aqua stretch techniques to decrease muscle tension in low back on right gluteal and Right low back .Marland KitchenLAD of LE R and L  Pt requires the buoyancy of water for active assisted exercises with buoyancy supported for strengthening and AROM exercises. Hydrostatic pressure also supports joints by unweighting joint load by at least 50 % in 3-4 feet depth water. 80% in chest to neck deep water. Water will provide assistance with movement using the current and laminar flow while the buoyancy reduces weight bearing. Pt requires the viscosity of the water for resistance with strengthening exercises.  [facility] Adult PT Treatment:                                                DATE: 06/11/22 Aquatic therapy at [address] - therapeutic pool temp 91 degrees Pt enters building independently.  Treatment took place in water 3.8 to  4 ft 8 in.feet deep depending upon activity.  Pt entered  and exited the pool via stair and handrails independently.   Pt pain level 7/10 at initiation  of water walking.  Therapeutic Exercise: Walking forward/backwards/side stepping with yellow DB by sides Runners stretch x30" BIL Hamstring stretch x30" BIL Figure 4 squat stretch, BIL UE support 2x30" BIL Standing thoracic rotation with noodle x1' Cat/cow with noodle x20 Hip flexor stretch with noodle x1' BIL Warrior I with noodle lift x10 BIL Warrior II with noodle hold x30" BIL Warrior III with noodle hold x30" BIL At edge of pool, pt performed LE exercise: Hip abd/add x20 BIL Hip ext/flex with knee straight x 20 BIL Marching hip flexion to knee extension x20 BIL Squats 2x20 Heel/toe raises x20  Sitting on noodle with yellow dumbbells at sides Bicycle kicks x1' Scissor kicks x1'  Flutter kicks x1'  Pt requires the buoyancy of water for active assisted exercises with buoyancy supported for strengthening and AROM exercises. Hydrostatic pressure also supports joints by unweighting joint load by at least 50 % in 3-4 feet depth water. 80% in chest to neck deep water. Water will provide assistance with movement using the current and laminar flow while the buoyancy reduces weight bearing. Pt requires the viscosity of the water for resistance with strengthening exercises.  [facility] Adult PT Treatment:                                                DATE: 05/29/2022 Aquatic therapy at [address] - therapeutic pool temp 91 degrees Pt enters building independently.  Treatment took place in water 3.8 to  4 ft 8 in.feet deep depending upon activity.  Pt entered and exited the pool via stair and handrails independently.   Pt pain level 4/10 at initiation of water walking. Pt at 2/10 at end of session  Therapeutic Exercise: Walking forward/backwards/side stepping Runners stretch x30" BIL Hamstring stretch x30" BIL Figure 4 squat stretch, BIL UE support 2x30" BIL Standing thoracic rotation with noodle x1' Cat/cow with noodle x20 Hip flexor stretch with noodle x1' BIL Warrior I  with noodle lift x10 BIL Warrior II with noodle hold x30" BIL Warrior III with noodle hold x30" BIL At edge of pool, pt performed LE exercise: Hip abd/add x20 BIL Hip ext/flex with knee straight x 20 BIL Marching hip flexion to knee extension x20 BIL Squats 2x20 Heel/toe raises x20  Sitting on noodle with yellow dumbbells at sides Bicycle kicks x1' Scissor kicks x1'  Flutter kicks x1'  Pt requires the buoyancy of water for active assisted exercises with buoyancy supported for strengthening and AROM exercises. Hydrostatic pressure also supports joints by unweighting joint load by at least 50 % in 3-4 feet depth water. 80% in chest to neck deep water. Water will provide assistance with movement using the current and laminar flow while the buoyancy reduces weight bearing. Pt requires the viscosity of the water for resistance with strengthening exercises.  DATE: 05/27/2022 Therapeutic Exercise: nu-step L5 44m while taking subjective and planning session with patient Hip flexor stretch Piriformis stretch RF stretch LTR - 20x  Bridge - 10x Hip adduction ball squeeze - 5'' hold - 2x10 Alternating clamshell with GTB - 2x10 Alternating SLR from foam roller - 2x10 Isometric ball squeeze in hooklying - 5'' - 2x10   Therapeutic Activity - collecting information for goals, checking progress, and reviewing with patient   PATIENT  EDUCATION:  POC, diagnosis, prognosis, HEP, and outcome measures.  Pt educated via explanation, demonstration, and handout (HEP).  Pt confirms understanding verbally. Handout for Aquatic therapy exercises    ASSESSMENT:   CLINICAL IMPRESSION: Patient presents to aquatic PT session with low pain today at 4/10 in her lower back  post 2 weeks lumbar injections. Session today focused on BIL LE and core strengthening, stretching, and general conditioning in the aquatic environment for use of buoyancy to offload joints and the viscosity of water as resistance during  therapeutic exercise. Patient was able to tread water for 2 minutes and exercise in deep water to encourage greater AROM with increased effort of exercises.  Pt benefited  from Bad Ragaz technique and aquat stretch for decrease low back muscle tension. . Patient continues to benefit from skilled PT services on land and aquatic based and should be progressed as able to improve functional independence.   OBJECTIVE IMPAIRMENTS: Pain, core strength, hip and LE strength   ACTIVITY LIMITATIONS: lifting, walking, standing, child care, housework   PERSONAL FACTORS: See medical history and pertinent history       GOALS:     SHORT TERM GOALS: Target date: 04/22/2022   Janet Rich will be >75% HEP compliant to improve carryover between sessions and facilitate independent management of condition   Evaluation (04/01/2022): ongoing 04/25/2022: Pt reports adherence to her HEP Goal status: ACHIEVED     LONG TERM GOALS: Target date: 07/22/2022 (extended)   Janet Rich will show a >/= 12 pt improvement in their ODI score (MCID is 12 pts) as a proxy for functional improvement    Evaluation/Baseline (04/01/2022): 58%  04/25/2022: 58% 8/9: 52.0 % disability Goal status: PROGRESSING     2.  Janet Rich will be able to maintain supine bridge for 60'' (dominant leg extended if 120'' reached) as evidence of improved hip extension and core strength (norm for healthy adult ~170'')    Evaluation/Baseline (04/01/2022): 19'' 04/25/2022: 35" 8/9: 50'' Goal status: PROGRESSING     3.  Janet Rich will be able to maintain plank for 61'' from knees (norm for healthy adult is ~70'' from feet)    Evaluation/Baseline (04/01/2022): 27'' from knees 04/25/2022: 20 seconds from feet 8/9: 43'' Goal status: PROGRESSING     4.  Janet Rich will be able to complete housework including laundry, not limited by pain   Evaluation/Baseline (04/01/2022): limited 8/9: able to do more laundry, but repetitive bending at dishwasher is still very difficult   Goal status: Progressing     5.  Janet Rich will self report >/= 50% decrease in pain from evaluation    Evaluation/Baseline (04/01/2022): 9/10 max pain 8/9: 25% improvement Goal status: Progressing     PLAN: PT FREQUENCY: 1-2x/week   PT DURATION: 8 weeks (Ending 07/22/2022) (extended)   PLANNED INTERVENTIONS: Therapeutic exercises, Aquatic therapy, Therapeutic activity, Neuro Muscular re-education, Gait training, Patient/Family education, Joint mobilization, Dry Needling, Electrical stimulation, Spinal mobilization and/or manipulation, Moist heat, Taping, Vasopneumatic device, Ionotophoresis 4mg /ml Dexamethasone, and Manual therapy   PLAN FOR NEXT SESSION: progressive core and hip strength, hip flexor/piriformis stretching     Voncille Lo, PT, [place] Certified Exercise Expert for the Aging Adult  07/08/22 4:15 PM Phone: [phone] Fax: [phone]

## 2022-07-09 ENCOUNTER — Ambulatory Visit: Payer: Medicaid Other

## 2022-07-09 DIAGNOSIS — R278 Other lack of coordination: Secondary | ICD-10-CM | POA: Diagnosis not present

## 2022-07-09 DIAGNOSIS — M6281 Muscle weakness (generalized): Secondary | ICD-10-CM

## 2022-07-09 DIAGNOSIS — M545 Low back pain, unspecified: Secondary | ICD-10-CM | POA: Diagnosis not present

## 2022-07-09 DIAGNOSIS — M542 Cervicalgia: Secondary | ICD-10-CM | POA: Diagnosis not present

## 2022-07-09 DIAGNOSIS — R2681 Unsteadiness on feet: Secondary | ICD-10-CM | POA: Diagnosis not present

## 2022-07-09 DIAGNOSIS — R42 Dizziness and giddiness: Secondary | ICD-10-CM | POA: Diagnosis not present

## 2022-07-09 NOTE — Therapy (Signed)
OUTPATIENT PHYSICAL THERAPY TREATMENT NOTE/ RE-AUTHORIZATION   Patient Name: Jazmin Crespin MRN: [id number] DOB:[birth year], 26 y.o., female Today's Date: 07/09/2022  PCP: Bo Merino, FNP REFERRING PROVIDER: Meeler, Sherren Kerns, FNP  END OF SESSION:   PT End of Session - 07/09/22 1818     Visit Number 15    Date for PT Re-Evaluation 07/22/22    Authorization Type [employer] (VL 27) - pending additional auth    Authorization Time Period 8 visits approved 06/01/22-07/02/22    Authorization - Visit Number 15    Authorization - Number of Visits 19    PT Start Time [id number]    PT Stop Time 1905    PT Time Calculation (min) 40 min    Activity Tolerance Patient tolerated treatment well    Behavior During Therapy WFL for tasks assessed/performed             Past Medical History:  Diagnosis Date   Anemia    with periods   Ankle fracture    right   Anxiety    Asthma    Back pain    Bipolar 1 disorder (HCC)    Depression    Bipolar    Endometriosis    Fibromyalgia    GERD (gastroesophageal reflux disease)    Headache    MIGRAINES   IBS (irritable bowel syndrome)    Ovarian cyst    PCOS (polycystic ovarian syndrome)    Pneumonia    age 40   PONV (postoperative nausea and vomiting)    nausea after wisdom teeth extraction   Scoliosis    Vaginal Pap smear, abnormal    bx ok   Past Surgical History:  Procedure Laterality Date   ABDOMINAL HYSTERECTOMY     FRACTURE SURGERY     ankle surgery with impant    HYSTEROSCOPY WITH D & C N/A 09/22/2019   Procedure: DILATATION AND CURETTAGE /HYSTEROSCOPY;  Surgeon: Ward, Honor Loh, MD;  Location: [facility];  Service: Gynecology;  Laterality: N/A;   LYSIS OF ADHESION N/A 05/08/2021   Procedure: LYSIS OF ADHESION, EXCISION OF ENDOMETRIOSIS;  Surgeon: Benjaman Kindler, MD;  Location: [facility];  Service: Gynecology;  Laterality: N/A;   ROBOTIC ASSISTED DIAGNOSTIC LAPAROSCOPY N/A 02/18/2017   Procedure: ROBOTIC ASSISTED DIAGNOSTIC  LAPAROSCOPY,EXCISION AND ABLATION OF ENDOMETRIOSIS,LYSIS OF ADHESIONS;  Surgeon: Brien Few, MD;  Location: [facility];  Service: Gynecology;  Laterality: N/A;   ROBOTIC ASSISTED LAPAROSCOPIC HYSTERECTOMY AND SALPINGECTOMY Bilateral 05/08/2021   Procedure: XI ROBOTIC ASSISTED TOTAL LAPAROSCOPIC HYSTERECTOMY AND SALPINGECTOMY;  Surgeon: Benjaman Kindler, MD;  Location: [facility];  Service: Gynecology;  Laterality: Bilateral;   VAGINAL DELIVERY N/A 08/14/2020   Procedure: VAGINAL DELIVERY;  Surgeon: Ouida Sills Gwen Her, MD;  Location: [facility];  Service: Obstetrics;  Laterality: N/A;   [place] EXTRACTION  2013   Patient Active Problem List   Diagnosis Date Noted   Bulging of lumbar intervertebral disc 06/16/2022   Fibromyalgia syndrome 03/09/2022   Chronic back pain 03/09/2022   Excessive daytime sleepiness 11/28/2021   Endometriosis determined by laparoscopy 05/08/2021   Myalgia 03/11/2021   Elevated BP without diagnosis of hypertension 01/08/2021   Dichorionic diamniotic twin pregnancy in third trimester 08/14/2020   Preeclampsia 08/14/2020   Twin pregnancy delivered vaginally 08/14/2020   Preterm labor 06/17/2020   Right trigeminal neuralgia 11/08/2019   Endometriosis 03/27/2019   PCOS (polycystic ovarian syndrome) 03/27/2019   Moderate persistent asthma 03/07/2019   Post-nasal drainage 03/07/2019   Irritable bowel syndrome with diarrhea 03/07/2019  Gastroesophageal reflux disease without esophagitis 03/07/2019   Class 1 obesity due to excess calories with serious comorbidity and body mass index (BMI) of 34.0 to 34.9 in adult 09/02/2018   Bipolar 2 disorder ([place]) 08/12/2018   History of low birth weight 03/11/2018   Vulvar atrophy 03/11/2018   History of migraine headaches 05/01/2015   Anxiety and depression 05/01/2015    REFERRING DIAG: Dorsalgia, unspecified [M54.9], Sciatica, unspecified side [M54.30]  THERAPY DIAG:  Low back pain, unspecified back pain laterality,  unspecified chronicity, unspecified whether sciatica present  Cervicalgia  Muscle weakness  Rationale for Evaluation and Treatment Rehabilitation  PERTINENT HISTORY: Bipolar, fibromyalgia, headaches, scoliosis, asthma, hysterectomy, anxiety, depression, PTSD   PRECAUTIONS: None  SUBJECTIVE: Patient reports pain more in her middle back today and in her R hip.  PAIN:  Are you having pain? Yes Pain location: low back pain NPRS scale:  Current 5/10  Aggravating factors: bending and lifting, standing (1/2 hour), sitting (1 hour)           NPRS, highest: 7/10 Relieving factors: changing positions, ice, heat, medication, rest Pain description: intermittent, constant, burning, stabbing, and "nerve" Stage: Chronic Stability: staying the same 24 hour pattern: better in the morning, then increasing throughout the day.    OBJECTIVE: (objective measures completed at initial evaluation unless otherwise dated)  DIAGNOSTIC FINDINGS:  None current   GENERAL OBSERVATION/GAIT:           Increased lumbar lordosis, R sided rib elevation in lumbar flexion, increased lumbar lordosis,   SENSATION:          Light touch: Appears intact   MUSCLE LENGTH: Hamstrings: Right no restriction; Left no restriction ASLR: Right ASLR = PSLR; Left ASLR = PSLR Bayly test: Right significant restriction; Left significant restriction Ely's test: Right significant restriction; Left significant restriction                         Tight piriformis bil       LUMBAR AROM   AROM AROM  04/01/2022 AROM 04/25/2022  Flexion WNL WNL  Extension WNL, w/ concordant pain WNL, w/ concordant pain  Right lateral flexion WNL, w/ concordant pain WNL, minor pain  Left lateral flexion WNL, w/ concordant pain WNL, minor pain  Right rotation limited by 25%, w/ concordant pain WNL  Left rotation limited by 25%, w/ no pain limited by 25%, w/ no pain    (Blank rows = not tested)   DIRECTIONAL PREFERENCE:           none    LE MMT:   MMT Right 04/01/2022 Left 04/01/2022 Right 04/25/2022 Left 04/25/2022  Hip flexion (L2, L3) 4 4 5/5 5/5  Knee extension (L3) 4 4    Knee flexion 4 4    Hip abduction 3+ 4 5/5 4+/5  Hip extension 3+ 4 4/5 5/5  Hip external rotation        Hip internal rotation        Hip adduction        Ankle dorsiflexion (L4)        Ankle plantarflexion (S1)        Ankle inversion        Ankle eversion        Great Toe ext (L5)        Grossly          (Blank rows = not tested, score listed is out of 5 possible points.  N = WNL, D =  diminished, C = clear for gross weakness with myotome testing, * = concordant pain with testing)     LE ROM:   ROM Right 04/01/2022 Left 04/01/2022  Hip flexion 90 90  Hip extension limited limited  Hip abduction      Hip adduction      Hip internal rotation limited limited  Hip external rotation      Knee flexion      Knee extension      Ankle dorsiflexion      Ankle plantarflexion      Ankle inversion      Ankle eversion         (Blank rows = not tested, N = WNL, * = concordant pain with testing)   Functional Tests   Eval (04/01/2022)  04/25/2022    Sustained supine bridge (dominant leg extended at 120'', if reached): 19'' (norm 170'')    35"    Standard plank from knees: 27'' (norm 70'' from feet)  20" from feet                                                                                             LUMBAR SPECIAL TESTS:  Straight leg raise: L (-), R (-) Slump: L (-), R (-) SI: fortin's sign, compression, sacral thrust (+)    PALPATION:            TTP bil PSIS, sacrum, TTP lumbar paraspinals L1-L5    SPINAL SEGMENTAL MOBILITY ASSESSMENT:  Painful, increase lordosis lumbar spine   PATIENT SURVEYS:  Modified Oswestry 29->58% disability  04/25/2022: 29 ->58%   HOME EXERCISE PROGRAM: Access Code: [email or web address] URL: [email or web address] Date: 04/01/2022 Prepared by: Shearon Balo   Exercises - Hip Flexor  Stretch at [facility] of Bed  - 2 x daily - 7 x weekly - 2 sets - 45 second hold - Supine Posterior Pelvic Tilt  - 2 x daily - 7 x weekly - 2 sets - 10 reps - 5'' hold - Supine Hip Adduction Isometric with Ball  - 1 x daily - 7 x weekly - 1 sets - 10 reps - 10'' hold - Supine Bridge  - 1 x daily - 7 x weekly - 2 sets - 10 reps - 5 second hold hold  Added 04/15/2022: - Marching Bridge  - 1 x daily - 7 x weekly - 3 sets - 20 reps - Prone Swimmer  - 1 x daily - 7 x weekly - 3 sets - 20 reps - Plank on Knees  - 1 x daily - 7 x weekly - 3 sets - to failure hold  Added 07/09/22: - Hooklying Clamshell with Resistance  - 1 x daily - 7 x weekly - 3 sets - 10 reps - Hip Abduction with Resistance Loop  - 1 x daily - 7 x weekly - 2 sets - 10 reps   ASTERISK SIGNS     Asterisk Signs Eval (04/01/2022) 8/9          piriformis Sig restriction            Hip flexors Sig restriction Moderate restriction  bridge 74'' 53''           Plank from knees 27''  36''          R hip ext and abd MMT 3+                  TODAY'S TREATMENT  [facility] Adult PT Treatment:                                                DATE: 07/09/2022 Therapeutic Exercise: nu-step L6 85m while taking subjective and planning session with patient Palloff press 7# 2x10 BIL Standing hip abduction/extension RTB at ankles 2x10 BIL Hip flexor stretch x1' BIL Piriformis stretch 2x30" BIL LTR - 20x  Bridge 2x10 Hip adduction ball squeeze - 5'' hold - 2x10 Alternating clamshell with GTB - 2x10 Isometric ball squeeze in hooklying - 5'' - 2x10  Supine abdominal pball press down 5" hold 2x10   [facility] Adult PT Treatment:                                                DATE: 07-08-22 Aquatic therapy at [address] - therapeutic pool temp 91 degrees Pt enters building independently.  Treatment took place in water 3.8 to  4 ft 8 in.feet deep depending upon activity.  Pt entered and exited the pool via stair and handrails  independently.   Pt pain level 7/10 at initiation of water walking.  Therapeutic Exercise: Walking forward/backwards/side stepping with yellow DB by sides Runners stretch x30" BIL Hamstring stretch x30" BIL Figure 4 squat stretch, BIL UE support 2x30" BIL Groin stretch BIL 30 " Standing thoracic rotation with noodle x1' Cat/cow with noodle x20 Hip flexor stretch with noodle x1' BIL added  PPT Standing IR/ER BIL Pt  educated on neutral posture and hip hinging in seated position with water at chest level x 10 with stretch to low back and then x 10 with back at pool wall at external cue, VC for neck tucked to prevent hyperextension.  At edge of pool, pt performed LE exercise: Hip abd/add x20 BIL Hip ext/flex with knee straight x 20 BIL Marching hip flexion to knee extension x20 BIL Squats 2x20 Heel/toe raises x20  Groin stretch BIL 30 " In 4'8 " deep water for greater LE AROM   Water treading 2 minutes Bicycle kicks x1' Reverse bicycle kicks x1' Flutter kicks x1' Scissor kicks x 1'  [place], Pt with pool noodle under arms and nek doodle for neck support.   Pt assisted into supine floating position by lying head on shoulder of PT to get into floating position. . PT at torso and assisting with trunk left to right and vice versa to engage trunk muscles. PT then rotated trunk in order to engage abdominal (internal and external obliques) Emphasis on breathing techniques to draw in abdominals for support.  Pt then utilizing posterior chain and engaging Hip extension and knee flexion with water resistance while PT used Aqua stretch techniques to decrease muscle tension in low back on right gluteal and Right low back .Marland KitchenLAD of LE R and L  Pt requires the buoyancy of water for active assisted exercises with buoyancy supported for strengthening and AROM exercises.  Hydrostatic pressure also supports joints by unweighting joint load by at least 50 % in 3-4 feet depth water. 80% in chest to neck  deep water. Water will provide assistance with movement using the current and laminar flow while the buoyancy reduces weight bearing. Pt requires the viscosity of the water for resistance with strengthening exercises.  [facility] Adult PT Treatment:                                                DATE: 06/11/22 Aquatic therapy at [address] - therapeutic pool temp 91 degrees Pt enters building independently.  Treatment took place in water 3.8 to  4 ft 8 in.feet deep depending upon activity.  Pt entered and exited the pool via stair and handrails independently.   Pt pain level 7/10 at initiation of water walking.  Therapeutic Exercise: Walking forward/backwards/side stepping with yellow DB by sides Runners stretch x30" BIL Hamstring stretch x30" BIL Figure 4 squat stretch, BIL UE support 2x30" BIL Standing thoracic rotation with noodle x1' Cat/cow with noodle x20 Hip flexor stretch with noodle x1' BIL Warrior I with noodle lift x10 BIL Warrior II with noodle hold x30" BIL Warrior III with noodle hold x30" BIL At edge of pool, pt performed LE exercise: Hip abd/add x20 BIL Hip ext/flex with knee straight x 20 BIL Marching hip flexion to knee extension x20 BIL Squats 2x20 Heel/toe raises x20  Sitting on noodle with yellow dumbbells at sides Bicycle kicks x1' Scissor kicks x1'  Flutter kicks x1'  Pt requires the buoyancy of water for active assisted exercises with buoyancy supported for strengthening and AROM exercises. Hydrostatic pressure also supports joints by unweighting joint load by at least 50 % in 3-4 feet depth water. 80% in chest to neck deep water. Water will provide assistance with movement using the current and laminar flow while the buoyancy reduces weight bearing. Pt requires the viscosity of the water for resistance with strengthening exercises.   PATIENT EDUCATION:  POC, diagnosis, prognosis, HEP, and outcome measures.  Pt educated via explanation,  demonstration, and handout (HEP).  Pt confirms understanding verbally. Handout for Aquatic therapy exercises    ASSESSMENT:   CLINICAL IMPRESSION: Patient presents to PT with continued reports of lower back pain and also endorses R hip pain today. Session today focused on proximal hip and core strengthening. Patient demonstrates improved tolerance for exercises today, completing more repetitions and with no increase in pain throughout session. Patient was able to tolerate all prescribed exercises with no adverse effects. Patient continues to benefit from skilled PT services and should be progressed as able to improve functional independence.   OBJECTIVE IMPAIRMENTS: Pain, core strength, hip and LE strength   ACTIVITY LIMITATIONS: lifting, walking, standing, child care, housework   PERSONAL FACTORS: See medical history and pertinent history       GOALS:     SHORT TERM GOALS: Target date: 04/22/2022   Santiana will be >75% HEP compliant to improve carryover between sessions and facilitate independent management of condition   Evaluation (04/01/2022): ongoing 04/25/2022: Pt reports adherence to her HEP Goal status: ACHIEVED     LONG TERM GOALS: Target date: 07/22/2022 (extended)   Laine will show a >/= 12 pt improvement in their ODI score (MCID is 12 pts) as a proxy for functional improvement    Evaluation/Baseline (04/01/2022): 58%  04/25/2022: 58% 8/9: 52.0 % disability Goal status: PROGRESSING     2.  Jaslen will be able to maintain supine bridge for 60'' (dominant leg extended if 120'' reached) as evidence of improved hip extension and core strength (norm for healthy adult ~170'')    Evaluation/Baseline (04/01/2022): 19'' 04/25/2022: 35" 8/9: 50'' Goal status: PROGRESSING     3.  Trinady will be able to maintain plank for 22'' from knees (norm for healthy adult is ~70'' from feet)    Evaluation/Baseline (04/01/2022): 27'' from knees 04/25/2022: 20 seconds from feet 8/9: 43'' Goal  status: PROGRESSING     4.  Sacha will be able to complete housework including laundry, not limited by pain   Evaluation/Baseline (04/01/2022): limited 8/9: able to do more laundry, but repetitive bending at dishwasher is still very difficult  Goal status: Progressing     5.  Aubriana will self report >/= 50% decrease in pain from evaluation    Evaluation/Baseline (04/01/2022): 9/10 max pain 8/9: 25% improvement Goal status: Progressing     PLAN: PT FREQUENCY: 1-2x/week   PT DURATION: 8 weeks (Ending 07/22/2022) (extended)   PLANNED INTERVENTIONS: Therapeutic exercises, Aquatic therapy, Therapeutic activity, Neuro Muscular re-education, Gait training, Patient/Family education, Joint mobilization, Dry Needling, Electrical stimulation, Spinal mobilization and/or manipulation, Moist heat, Taping, Vasopneumatic device, Ionotophoresis 4mg /ml Dexamethasone, and Manual therapy   PLAN FOR NEXT SESSION: progressive core and hip strength, hip flexor/piriformis stretching     Margarette Canada, PTA 07/09/22 7:05 PM

## 2022-07-14 NOTE — Therapy (Addendum)
OUTPATIENT PHYSICAL THERAPY TREATMENT NOTE/ RE-AUTHORIZATION/DISCHARGE NOTE PHYSICAL THERAPY DISCHARGE SUMMARY  Visits from Start of Care: 16  Current functional level related to goals / functional outcomes: UNKNOWN   Remaining deficits: unknown   Education / Equipment: HEP   Patient agrees to discharge. Patient goals were partially met. Patient is being discharged due to not returning since the last visit.    Patient Name: Janet Rich MRN: [id number] DOB:1996-03-29, 26 y.o., female Today's Date: 07/15/2022  PCP: Bo Merino, FNP REFERRING PROVIDER: Meeler, Sherren Kerns, FNP  END OF SESSION:   PT End of Session - 07/15/22 1335     Visit Number 16    Date for PT Re-Evaluation 07/22/22    Authorization Type [employer] (VL 27) - pending additional auth    Authorization Time Period 8 visits approved 06/01/22-07/02/22    Authorization - Visit Number 86    Authorization - Number of Visits 19    PT Start Time 1330    PT Stop Time 1420    PT Time Calculation (min) 50 min    Activity Tolerance Patient tolerated treatment well    Behavior During Therapy WFL for tasks assessed/performed              Past Medical History:  Diagnosis Date   Anemia    with periods   Ankle fracture    right   Anxiety    Asthma    Back pain    Bipolar 1 disorder (HCC)    Depression    Bipolar    Endometriosis    Fibromyalgia    GERD (gastroesophageal reflux disease)    Headache    MIGRAINES   IBS (irritable bowel syndrome)    Ovarian cyst    PCOS (polycystic ovarian syndrome)    Pneumonia    age 44   PONV (postoperative nausea and vomiting)    nausea after wisdom teeth extraction   Scoliosis    Vaginal Pap smear, abnormal    bx ok   Past Surgical History:  Procedure Laterality Date   ABDOMINAL HYSTERECTOMY     FRACTURE SURGERY     ankle surgery with impant    HYSTEROSCOPY WITH D & C N/A 09/22/2019   Procedure: DILATATION AND CURETTAGE /HYSTEROSCOPY;  Surgeon: Ward,  Honor Loh, MD;  Location: [facility];  Service: Gynecology;  Laterality: N/A;   LYSIS OF ADHESION N/A 05/08/2021   Procedure: LYSIS OF ADHESION, EXCISION OF ENDOMETRIOSIS;  Surgeon: Benjaman Kindler, MD;  Location: [facility];  Service: Gynecology;  Laterality: N/A;   ROBOTIC ASSISTED DIAGNOSTIC LAPAROSCOPY N/A 02/18/2017   Procedure: ROBOTIC ASSISTED DIAGNOSTIC LAPAROSCOPY,EXCISION AND ABLATION OF ENDOMETRIOSIS,LYSIS OF ADHESIONS;  Surgeon: Brien Few, MD;  Location: [facility];  Service: Gynecology;  Laterality: N/A;   ROBOTIC ASSISTED LAPAROSCOPIC HYSTERECTOMY AND SALPINGECTOMY Bilateral 05/08/2021   Procedure: XI ROBOTIC ASSISTED TOTAL LAPAROSCOPIC HYSTERECTOMY AND SALPINGECTOMY;  Surgeon: Benjaman Kindler, MD;  Location: [facility];  Service: Gynecology;  Laterality: Bilateral;   VAGINAL DELIVERY N/A 08/14/2020   Procedure: VAGINAL DELIVERY;  Surgeon: Ouida Sills Gwen Her, MD;  Location: [facility];  Service: Obstetrics;  Laterality: N/A;   [place] EXTRACTION  2013   Patient Active Problem List   Diagnosis Date Noted   Bulging of lumbar intervertebral disc 06/16/2022   Fibromyalgia syndrome 03/09/2022   Chronic back pain 03/09/2022   Excessive daytime sleepiness 11/28/2021   Endometriosis determined by laparoscopy 05/08/2021   Myalgia 03/11/2021   Elevated BP without diagnosis of hypertension 01/08/2021   Dichorionic  diamniotic twin pregnancy in third trimester 08/14/2020   Preeclampsia 08/14/2020   Twin pregnancy delivered vaginally 08/14/2020   Preterm labor 06/17/2020   Right trigeminal neuralgia 11/08/2019   Endometriosis 03/27/2019   PCOS (polycystic ovarian syndrome) 03/27/2019   Moderate persistent asthma 03/07/2019   Post-nasal drainage 03/07/2019   Irritable bowel syndrome with diarrhea 03/07/2019   Gastroesophageal reflux disease without esophagitis 03/07/2019   Class 1 obesity due to excess calories with serious comorbidity and body mass index (BMI) of 34.0 to 34.9 in  adult 09/02/2018   Bipolar 2 disorder (HCC) 08/12/2018   History of low birth weight 03/11/2018   Vulvar atrophy 03/11/2018   History of migraine headaches 05/01/2015   Anxiety and depression 05/01/2015    REFERRING DIAG: Dorsalgia, unspecified [M54.9], Sciatica, unspecified side [M54.30]  THERAPY DIAG:  Low back pain, unspecified back pain laterality, unspecified chronicity, unspecified whether sciatica present  Cervicalgia  Muscle weakness  Dizziness and giddiness  Unsteadiness on feet  Other lack of coordination  Rationale for Evaluation and Treatment Rehabilitation  PERTINENT HISTORY: Bipolar, fibromyalgia, headaches, scoliosis, asthma, hysterectomy, anxiety, depression, PTSD   PRECAUTIONS: None  SUBJECTIVE: Patient reports 1/10 pain in her middle back today and in her R hip. Pt reports being extra sore in R knee and hip for 2 days but better now. I am spending  more time on computer and homeschooling so my posture is not the best and sitting more 07-15-22  1/10 pain R, 3/10 Left hip  back is fine PAIN:  Are you having pain? Yes Pain location: low back pain NPRS scale:  Current 5/10  Aggravating factors: bending and lifting, standing (1/2 hour), sitting (1 hour)           NPRS, highest: 7/10 Relieving factors: changing positions, ice, heat, medication, rest Pain description: intermittent, constant, burning, stabbing, and "nerve" Stage: Chronic Stability: staying the same 24 hour pattern: better in the morning, then increasing throughout the day.    OBJECTIVE: (objective measures completed at initial evaluation unless otherwise dated)  DIAGNOSTIC FINDINGS:  None current   GENERAL OBSERVATION/GAIT:           Increased lumbar lordosis, R sided rib elevation in lumbar flexion, increased lumbar lordosis,   SENSATION:          Light touch: Appears intact   MUSCLE LENGTH: Hamstrings: Right no restriction; Left no restriction ASLR: Right ASLR = PSLR; Left ASLR =  PSLR Murthy test: Right significant restriction; Left significant restriction Ely's test: Right significant restriction; Left significant restriction                         Tight piriformis bil       LUMBAR AROM   AROM AROM  04/01/2022 AROM 04/25/2022  Flexion WNL WNL  Extension WNL, w/ concordant pain WNL, w/ concordant pain  Right lateral flexion WNL, w/ concordant pain WNL, minor pain  Left lateral flexion WNL, w/ concordant pain WNL, minor pain  Right rotation limited by 25%, w/ concordant pain WNL  Left rotation limited by 25%, w/ no pain limited by 25%, w/ no pain    (Blank rows = not tested)   DIRECTIONAL PREFERENCE:           none   LE MMT:   MMT Right 04/01/2022 Left 04/01/2022 Right 04/25/2022 Left 04/25/2022  Hip flexion (L2, L3) 4 4 5/5 5/5  Knee extension (L3) 4 4    Knee flexion 4 4  Hip abduction 3+ 4 5/5 4+/5  Hip extension 3+ 4 4/5 5/5  Hip external rotation        Hip internal rotation        Hip adduction        Ankle dorsiflexion (L4)        Ankle plantarflexion (S1)        Ankle inversion        Ankle eversion        Great Toe ext (L5)        Grossly          (Blank rows = not tested, score listed is out of 5 possible points.  N = WNL, D = diminished, C = clear for gross weakness with myotome testing, * = concordant pain with testing)     LE ROM:   ROM Right 04/01/2022 Left 04/01/2022  Hip flexion 90 90  Hip extension limited limited  Hip abduction      Hip adduction      Hip internal rotation limited limited  Hip external rotation      Knee flexion      Knee extension      Ankle dorsiflexion      Ankle plantarflexion      Ankle inversion      Ankle eversion         (Blank rows = not tested, N = WNL, * = concordant pain with testing)   Functional Tests   Eval (04/01/2022)  04/25/2022    Sustained supine bridge (dominant leg extended at 120'', if reached): 19'' (norm 170'')    35"    Standard plank from knees: 27'' (norm 70'' from  feet)  20" from feet                                                                                             LUMBAR SPECIAL TESTS:  Straight leg raise: L (-), R (-) Slump: L (-), R (-) SI: fortin's sign, compression, sacral thrust (+)    PALPATION:            TTP bil PSIS, sacrum, TTP lumbar paraspinals L1-L5    SPINAL SEGMENTAL MOBILITY ASSESSMENT:  Painful, increase lordosis lumbar spine   PATIENT SURVEYS:  Modified Oswestry 29->58% disability  04/25/2022: 29 ->58%   HOME EXERCISE PROGRAM: Access Code: [email or web address] URL: [email or web address] Date: 04/01/2022 Prepared by: Shearon Balo   Exercises - Hip Flexor Stretch at [facility] of Bed  - 2 x daily - 7 x weekly - 2 sets - 45 second hold - Supine Posterior Pelvic Tilt  - 2 x daily - 7 x weekly - 2 sets - 10 reps - 5'' hold - Supine Hip Adduction Isometric with Ball  - 1 x daily - 7 x weekly - 1 sets - 10 reps - 10'' hold - Supine Bridge  - 1 x daily - 7 x weekly - 2 sets - 10 reps - 5 second hold hold  Added 04/15/2022: - Marching Bridge  - 1 x daily - 7 x weekly - 3 sets - 20 reps - Prone Swimmer  - 1  x daily - 7 x weekly - 3 sets - 20 reps - Plank on Knees  - 1 x daily - 7 x weekly - 3 sets - to failure hold  Added 07/09/22: - Hooklying Clamshell with Resistance  - 1 x daily - 7 x weekly - 3 sets - 10 reps - Hip Abduction with Resistance Loop  - 1 x daily - 7 x weekly - 2 sets - 10 reps   ASTERISK SIGNS     Asterisk Signs Eval (04/01/2022) 8/9          piriformis Sig restriction            Hip flexors Sig restriction Moderate restriction           bridge 19'' 50''           Plank from knees 27''  10''          R hip ext and abd MMT 3+                  TODAY'S TREATMENT    [facility] Adult PT Treatment:                                                DATE: 07-14-22 Aquatic therapy at [address] - therapeutic pool temp 92degrees Pt enters building independently.  Treatment took  place in water 3.8 to  4 ft 8 in.feet deep depending upon activity.  Pt entered and exited the pool via stair and handrails independently.   Pt pain level 1/10 at initiation of water walking.  Therapeutic Exercise: Runners stretch x30" BIL Hamstring stretch x30" BIL Figure 4 squat stretch, BIL UE support  2 x 60 " Squats x 20 Groin stretch BIL 30 " Hip hinge x 20 Bulgarian split squat 10 x on R and L  At edge of pool, pt performed LE exercise: Hip abd/add x20 BIL Hip ext/flex with knee straight x 20 BIL Marching hip flexion to knee extension x20 BIL Squats 2x20 Heel/toe raises x20  Groin stretch BIL 30 " Supine abdominal hollowing with VC to keep hips up and feet up with  yellow barbells in UE submerged prone aquatic  plank with barbell submerged for abdominal engagement, difficulty keeping hip elevated Prone to supine abdominal hollowing In 4'8 " deep water for greater LE AROM   Water treading 3 minutes Bicycle kicks x1' Reverse bicycle kicks x1'   Bad Ragaz, Pt with pool noodle under arms and nek doodle for neck support.   Pt assisted into supine floating position by lying head on shoulder of PT to get into floating position. . PT at torso and assisting with trunk left to right and vice versa to engage trunk muscles. PT then rotated trunk in order to engage abdominal (internal and external obliques) Emphasis on breathing techniques to draw in abdominals for support.  Pt then utilizing posterior chain and engaging Hip extension and knee flexion with water resistance while PT used Aqua stretch techniques to decrease muscle tension in low back on right knee quad .LAD of LE R and L   Ending session with walking water forward, backward and side stepping   Pt requires the buoyancy of water for active assisted exercises with buoyancy supported for strengthening and AROM exercises. Hydrostatic pressure also supports joints by unweighting joint load by at least 50 %  in 3-4 feet depth water.  80% in chest to neck deep water. Water will provide assistance with movement using the current and laminar flow while the buoyancy reduces weight bearing. Pt requires the viscosity of the water for resistance with strengthening exercises.    [facility] Adult PT Treatment:                                                DATE: 07/09/2022 Therapeutic Exercise: nu-step L6 71m while taking subjective and planning session with patient Palloff press 7# 2x10 BIL Standing hip abduction/extension RTB at ankles 2x10 BIL Hip flexor stretch x1' BIL Piriformis stretch 2x30" BIL LTR - 20x  Bridge 2x10 Hip adduction ball squeeze - 5'' hold - 2x10 Alternating clamshell with GTB - 2x10 Isometric ball squeeze in hooklying - 5'' - 2x10  Supine abdominal pball press down 5" hold 2x10   [facility] Adult PT Treatment:                                                DATE: 07-08-22 Aquatic therapy at [facility] [address] - therapeutic pool temp 91 degrees Pt enters building independently.  Treatment took place in water 3.8 to  4 ft 8 in.feet deep depending upon activity.  Pt entered and exited the pool via stair and handrails independently.   Pt pain level 7/10 at initiation of water walking.  Therapeutic Exercise: Walking forward/backwards/side stepping with yellow DB by sides Runners stretch x30" BIL Hamstring stretch x30" BIL Figure 4 squat stretch, BIL UE support 2x30" BIL Groin stretch BIL 30 " Standing thoracic rotation with noodle x1' Cat/cow with noodle x20 Hip flexor stretch with noodle x1' BIL added  PPT Standing IR/ER BIL Pt  educated on neutral posture and hip hinging in seated position with water at chest level x 10 with stretch to low back and then x 10 with back at pool wall at external cue, VC for neck tucked to prevent hyperextension.  At edge of pool, pt performed LE exercise: Hip abd/add x20 BIL Hip ext/flex with knee straight x 20 BIL Marching hip flexion to knee extension x20 BIL Squats  2x20 Heel/toe raises x20  Groin stretch BIL 30 " In 4'8 " deep water for greater LE AROM   Water treading 2 minutes Bicycle kicks x1' Reverse bicycle kicks x1' Flutter kicks x1' Scissor kicks x 1'  Bad Ragaz, Pt with pool noodle under arms and nek doodle for neck support.   Pt assisted into supine floating position by lying head on shoulder of PT to get into floating position. . PT at torso and assisting with trunk left to right and vice versa to engage trunk muscles. PT then rotated trunk in order to engage abdominal (internal and external obliques) Emphasis on breathing techniques to draw in abdominals for support.  Pt then utilizing posterior chain and engaging Hip extension and knee flexion with water resistance while PT used Aqua stretch techniques to decrease muscle tension in low back on right gluteal and Right low back .Marland KitchenLAD of LE R and L  Pt requires the buoyancy of water for active assisted exercises with buoyancy supported for strengthening and AROM exercises. Hydrostatic pressure also supports joints by unweighting joint load  by at least 50 % in 3-4 feet depth water. 80% in chest to neck deep water. Water will provide assistance with movement using the current and laminar flow while the buoyancy reduces weight bearing. Pt requires the viscosity of the water for resistance with strengthening exercises.  [facility] Adult PT Treatment:                                                DATE: 06/11/22 Aquatic therapy at [address] - therapeutic pool temp 91 degrees Pt enters building independently.  Treatment took place in water 3.8 to  4 ft 8 in.feet deep depending upon activity.  Pt entered and exited the pool via stair and handrails independently.   Pt pain level 7/10 at initiation of water walking.  Therapeutic Exercise: Walking forward/backwards/side stepping with yellow DB by sides Runners stretch x30" BIL Hamstring stretch x30" BIL Figure 4 squat stretch, BIL UE  support 2x30" BIL Standing thoracic rotation with noodle x1' Cat/cow with noodle x20 Hip flexor stretch with noodle x1' BIL Warrior I with noodle lift x10 BIL Warrior II with noodle hold x30" BIL Warrior III with noodle hold x30" BIL At edge of pool, pt performed LE exercise: Hip abd/add x20 BIL Hip ext/flex with knee straight x 20 BIL Marching hip flexion to knee extension x20 BIL Squats 2x20 Heel/toe raises x20  Sitting on noodle with yellow dumbbells at sides Bicycle kicks x1' Scissor kicks x1'  Flutter kicks x1'  Pt requires the buoyancy of water for active assisted exercises with buoyancy supported for strengthening and AROM exercises. Hydrostatic pressure also supports joints by unweighting joint load by at least 50 % in 3-4 feet depth water. 80% in chest to neck deep water. Water will provide assistance with movement using the current and laminar flow while the buoyancy reduces weight bearing. Pt requires the viscosity of the water for resistance with strengthening exercises.   PATIENT EDUCATION:  POC, diagnosis, prognosis, HEP, and outcome measures.  Pt educated via explanation, demonstration, and handout (HEP).  Pt confirms understanding verbally. Handout for Aquatic therapy exercises    ASSESSMENT:   CLINICAL IMPRESSION: Janet Rich presents today iwh 1/10 pain in R hip and R knee today.  At end of session and exercise, pt was 0/10 pain in water. Pt able to stretch R quad that was 1/10 pain and was 0/10 after [place] squat and aqua stretch.  Pt with no adverse effects after exercise and was able to add more abdominal and core exercises today.  Pt core challenged with Bad Ragaz and no pain increase with more challenging trunk motions. Patient continues to benefit from skilled PT services and should be progressed as able to improve functional independence.   OBJECTIVE IMPAIRMENTS: Pain, core strength, hip and LE strength   ACTIVITY LIMITATIONS: lifting, walking, standing,  child care, housework   PERSONAL FACTORS: See medical history and pertinent history       GOALS:     SHORT TERM GOALS: Target date: 04/22/2022   Faiga will be >75% HEP compliant to improve carryover between sessions and facilitate independent management of condition   Evaluation (04/01/2022): ongoing 04/25/2022: Pt reports adherence to her HEP Goal status: ACHIEVED     LONG TERM GOALS: Target date: 07/22/2022 (extended)   Ronni will show a >/= 12 pt improvement in their ODI score (MCID is  12 pts) as a proxy for functional improvement    Evaluation/Baseline (04/01/2022): 58%  04/25/2022: 58% 8/9: 52.0 % disability Goal status: PROGRESSING     2.  Jaziel will be able to maintain supine bridge for 60'' (dominant leg extended if 120'' reached) as evidence of improved hip extension and core strength (norm for healthy adult ~170'')    Evaluation/Baseline (04/01/2022): 19'' 04/25/2022: 35" 8/9: 50'' Goal status: PROGRESSING     3.  Zipporah will be able to maintain plank for 63'' from knees (norm for healthy adult is ~70'' from feet)    Evaluation/Baseline (04/01/2022): 27'' from knees 04/25/2022: 20 seconds from feet 8/9: 43'' Goal status: PROGRESSING     4.  Lorana will be able to complete housework including laundry, not limited by pain   Evaluation/Baseline (04/01/2022): limited 8/9: able to do more laundry, but repetitive bending at dishwasher is still very difficult  Goal status: Progressing     5.  Mahelet will self report >/= 50% decrease in pain from evaluation    Evaluation/Baseline (04/01/2022): 9/10 max pain 8/9: 25% improvement Goal status: Progressing     PLAN: PT FREQUENCY: 1-2x/week   PT DURATION: 8 weeks (Ending 07/22/2022) (extended)   PLANNED INTERVENTIONS: Therapeutic exercises, Aquatic therapy, Therapeutic activity, Neuro Muscular re-education, Gait training, Patient/Family education, Joint mobilization, Dry Needling, Electrical stimulation, Spinal  mobilization and/or manipulation, Moist heat, Taping, Vasopneumatic device, Ionotophoresis 4mg /ml Dexamethasone, and Manual therapy   PLAN FOR NEXT SESSION: progressive core and hip strength, hip flexor/piriformis stretching    Voncille Lo, PT, ATRIC Certified Exercise Expert for the Aging Adult  07/15/22 2:42 PM Phone: [phone] Fax: [place], [place], [place] Certified Exercise Expert for the Aging Adult  11/26/22 11:55 AM Phone: [phone] Fax: [phone]

## 2022-07-15 ENCOUNTER — Ambulatory Visit: Payer: Medicaid Other | Admitting: Physical Therapy

## 2022-07-15 ENCOUNTER — Encounter: Payer: Self-pay | Admitting: Physical Therapy

## 2022-07-15 DIAGNOSIS — R2681 Unsteadiness on feet: Secondary | ICD-10-CM

## 2022-07-15 DIAGNOSIS — R42 Dizziness and giddiness: Secondary | ICD-10-CM

## 2022-07-15 DIAGNOSIS — M6281 Muscle weakness (generalized): Secondary | ICD-10-CM | POA: Diagnosis not present

## 2022-07-15 DIAGNOSIS — R278 Other lack of coordination: Secondary | ICD-10-CM | POA: Diagnosis not present

## 2022-07-15 DIAGNOSIS — M545 Low back pain, unspecified: Secondary | ICD-10-CM

## 2022-07-15 DIAGNOSIS — M542 Cervicalgia: Secondary | ICD-10-CM | POA: Diagnosis not present

## 2022-07-16 ENCOUNTER — Ambulatory Visit: Payer: Medicaid Other

## 2022-07-16 ENCOUNTER — Telehealth: Payer: Self-pay

## 2022-07-16 NOTE — Telephone Encounter (Signed)
Spoke with patient regarding missed appointment. Confirmed next appointment time.  Janet Rich, [name] 07/16/22 6:51 PM

## 2022-07-19 DIAGNOSIS — Z419 Encounter for procedure for purposes other than remedying health state, unspecified: Secondary | ICD-10-CM | POA: Diagnosis not present

## 2022-07-20 DIAGNOSIS — M5126 Other intervertebral disc displacement, lumbar region: Secondary | ICD-10-CM | POA: Diagnosis not present

## 2022-07-20 DIAGNOSIS — M5136 Other intervertebral disc degeneration, lumbar region: Secondary | ICD-10-CM | POA: Diagnosis not present

## 2022-07-20 DIAGNOSIS — M5416 Radiculopathy, lumbar region: Secondary | ICD-10-CM | POA: Diagnosis not present

## 2022-07-21 ENCOUNTER — Ambulatory Visit: Payer: Medicaid Other

## 2022-07-22 ENCOUNTER — Ambulatory Visit: Payer: Medicaid Other | Admitting: Physical Therapy

## 2022-07-22 DIAGNOSIS — F411 Generalized anxiety disorder: Secondary | ICD-10-CM | POA: Diagnosis not present

## 2022-07-22 DIAGNOSIS — F330 Major depressive disorder, recurrent, mild: Secondary | ICD-10-CM | POA: Diagnosis not present

## 2022-07-22 DIAGNOSIS — F4312 Post-traumatic stress disorder, chronic: Secondary | ICD-10-CM | POA: Diagnosis not present

## 2022-07-23 ENCOUNTER — Encounter: Payer: Self-pay | Admitting: Neurology

## 2022-07-28 DIAGNOSIS — M9905 Segmental and somatic dysfunction of pelvic region: Secondary | ICD-10-CM | POA: Diagnosis not present

## 2022-07-28 DIAGNOSIS — M5386 Other specified dorsopathies, lumbar region: Secondary | ICD-10-CM | POA: Diagnosis not present

## 2022-07-28 DIAGNOSIS — M9904 Segmental and somatic dysfunction of sacral region: Secondary | ICD-10-CM | POA: Diagnosis not present

## 2022-07-28 DIAGNOSIS — M9903 Segmental and somatic dysfunction of lumbar region: Secondary | ICD-10-CM | POA: Diagnosis not present

## 2022-07-29 ENCOUNTER — Telehealth: Payer: Self-pay | Admitting: Neurology

## 2022-07-29 ENCOUNTER — Encounter: Payer: Self-pay | Admitting: Nurse Practitioner

## 2022-07-29 ENCOUNTER — Telehealth (INDEPENDENT_AMBULATORY_CARE_PROVIDER_SITE_OTHER): Payer: Medicaid Other | Admitting: Nurse Practitioner

## 2022-07-29 ENCOUNTER — Ambulatory Visit: Payer: Medicaid Other | Admitting: Physical Therapy

## 2022-07-29 ENCOUNTER — Other Ambulatory Visit: Payer: Self-pay

## 2022-07-29 ENCOUNTER — Ambulatory Visit: Payer: Self-pay

## 2022-07-29 DIAGNOSIS — J029 Acute pharyngitis, unspecified: Secondary | ICD-10-CM | POA: Diagnosis not present

## 2022-07-29 DIAGNOSIS — F330 Major depressive disorder, recurrent, mild: Secondary | ICD-10-CM | POA: Diagnosis not present

## 2022-07-29 DIAGNOSIS — F4312 Post-traumatic stress disorder, chronic: Secondary | ICD-10-CM | POA: Diagnosis not present

## 2022-07-29 DIAGNOSIS — K219 Gastro-esophageal reflux disease without esophagitis: Secondary | ICD-10-CM | POA: Diagnosis not present

## 2022-07-29 DIAGNOSIS — F411 Generalized anxiety disorder: Secondary | ICD-10-CM | POA: Diagnosis not present

## 2022-07-29 MED ORDER — AZITHROMYCIN 500 MG PO TABS: 500.0000 mg | ORAL_TABLET | Freq: Every day | ORAL | 0 refills | Status: AC

## 2022-07-29 MED ORDER — FAMOTIDINE 20 MG PO TABS: 20.0000 mg | ORAL_TABLET | Freq: Two times a day (BID) | ORAL | 0 refills | Status: DC

## 2022-07-29 MED ORDER — NYSTATIN 100000 UNIT/ML MT SUSP: 5.0000 mL | Freq: Three times a day (TID) | OROMUCOSAL | 0 refills | Status: DC | PRN

## 2022-07-29 NOTE — Telephone Encounter (Signed)
Pt scheduled for virtual visit with Sarah for 07/30/2022 at 12:45pm

## 2022-07-29 NOTE — Telephone Encounter (Signed)
Chief Complaint: Pain in throat Symptoms: Unable to swallow solids, post nasal drip Frequency: Onset suddenly last night Pertinent Negatives: Patient denies other symptoms Disposition: [] ED /[] Urgent Care (no appt availability in office) / [x] Appointment(In office/virtual)/ []  [facility]/ [] Home Care/ [] Refused Recommended Disposition /[] [place] [facility]/ []  Follow-up with PCP Additional Notes: Requested virtual visit due to unable to come to office, scheduled today with PCP.   Reason for Disposition  [1] Sore throat is the only symptom AND [2] present > 48 hours  Answer Assessment - Initial Assessment Questions 1. ONSET: "When did the throat start hurting?" (Hours or days ago)      Last night 2. SEVERITY: "How bad is the sore throat?" (Scale 1-10; mild, moderate or severe)   - MILD (1-3):  Doesn't interfere with eating or normal activities.   - MODERATE (4-7): Interferes with eating some solids and normal activities.   - SEVERE (8-10):  Excruciating pain, interferes with most normal activities.   - SEVERE WITH DYSPHAGIA (10): Can't swallow liquids, drooling.     Moderate 3. STREP EXPOSURE: "Has there been any exposure to strep within the past week?" If Yes, ask: "What type of contact occurred?"      No 4.  VIRAL SYMPTOMS: "Are there any symptoms of a cold, such as a runny nose, cough, hoarse voice or red eyes?"      No 5. FEVER: "Do you have a fever?" If Yes, ask: "What is your temperature, how was it measured, and when did it start?"     Not hot 6. PUS ON THE TONSILS: "Is there pus on the tonsils in the back of your throat?"     No, just red and swollen 7. OTHER SYMPTOMS: "Do you have any other symptoms?" (e.g., difficulty breathing, headache, rash)     Feels like post nasal drip 8. PREGNANCY: "Is there any chance you are pregnant?" "When was your last menstrual period?"     No  Protocols used: Sore Throat-A-AH

## 2022-07-29 NOTE — Assessment & Plan Note (Signed)
Continue taking omeprazole 20 mg daily.  Increase Pepcid to 20 mg 2 times a day.

## 2022-07-29 NOTE — Telephone Encounter (Signed)
Patient is c/o headache and changing medical management but we have not seen her in close to a year. Can you set her up with an NP, first available asap, can even be a video visit, to discuss? I can't make changes if we haven't seen her in so long and we need to talk when we change medical management thanks

## 2022-07-29 NOTE — Progress Notes (Signed)
Name: Janet Rich   MRN: [id number]    DOB: July 10, 1996   Date:07/29/2022       Progress Note  Subjective  Chief Complaint  Chief Complaint  Patient presents with   Sore Throat    Since yesterday    I connected with  Marvel Plan  on 07/29/22 at 11:30 am by a video enabled telemedicine application and verified that I am speaking with the correct person using two identifiers.  I discussed the limitations of evaluation and management by telemedicine and the availability of in person appointments. The patient expressed understanding and agreed to proceed with a virtual visit  Staff also discussed with the patient that there may be a patient responsible charge related to this service. Patient Location: home Provider Location: [facility] Additional Individuals present: alone  HPI  Sore throat: Patient reports she has a sore throat started yesterday. She says it feels like strep.  She has had strep in the past.  She denies any other symptoms.  Able to view throat through virtual appointment.  Patient does report that her throat is very red.  This is very painful when she tries to swallow.  We will send in azithromycin, Magic mouthwash.  Patient is already doing hot tea with honey.  She can also do salt water gargles.   Acid reflux: Patient reports her acid reflux has been worse lately.  She currently takes omeprazole 20 mg daily and Pepcid 20 mg at night.  Discussed with patient increasing Pepcid to 2 times a day.  Patient Active Problem List   Diagnosis Date Noted   Bulging of lumbar intervertebral disc 06/16/2022   Fibromyalgia syndrome 03/09/2022   Chronic back pain 03/09/2022   Excessive daytime sleepiness 11/28/2021   Endometriosis determined by laparoscopy 05/08/2021   Myalgia 03/11/2021   Elevated BP without diagnosis of hypertension 01/08/2021   Dichorionic diamniotic twin pregnancy in third trimester 08/14/2020   Preeclampsia 08/14/2020   Twin pregnancy delivered vaginally  08/14/2020   Preterm labor 06/17/2020   Right trigeminal neuralgia 11/08/2019   Endometriosis 03/27/2019   PCOS (polycystic ovarian syndrome) 03/27/2019   Moderate persistent asthma 03/07/2019   Post-nasal drainage 03/07/2019   Irritable bowel syndrome with diarrhea 03/07/2019   Gastroesophageal reflux disease without esophagitis 03/07/2019   Class 1 obesity due to excess calories with serious comorbidity and body mass index (BMI) of 34.0 to 34.9 in adult 09/02/2018   Bipolar 2 disorder (HCC) 08/12/2018   History of low birth weight 03/11/2018   Vulvar atrophy 03/11/2018   History of migraine headaches 05/01/2015   Anxiety and depression 05/01/2015    Social History   Tobacco Use   Smoking status: Former    Years: 1.00    Types: Cigarettes    Quit date: 12/20/2009    Years since quitting: 12.6   Smokeless tobacco: Never   Tobacco comments:    [age], quit, 1 cig daily  Substance Use Topics   Alcohol use: Not Currently     Current Outpatient Medications:    acetaminophen (TYLENOL) 500 MG tablet, Take 2 tablets (1,000 mg total) by mouth every 6 (six) hours as needed for moderate pain., Disp: 30 tablet, Rfl: 0   albuterol (PROVENTIL) (2.5 MG/3ML) 0.083% nebulizer solution, Take 3 mLs (2.5 mg total) by nebulization every 6 (six) hours as needed for wheezing or shortness of breath., Disp: 75 mL, Rfl: 12   albuterol (VENTOLIN HFA) 108 (90 Base) MCG/ACT inhaler, Inhale 1-2 puffs into the lungs every 6 (  six) hours as needed for wheezing or shortness of breath., Disp: 1 each, Rfl: 3   ALPRAZolam (XANAX) 0.25 MG tablet, Take 0.25 mg by mouth at bedtime as needed for anxiety., Disp: , Rfl:    Ascorbic Acid (VITAMIN C) 100 MG tablet, Take 100 mg by mouth daily., Disp: , Rfl:    azithromycin (ZITHROMAX) 500 MG tablet, Take 1 tablet (500 mg total) by mouth daily for 5 days., Disp: 5 tablet, Rfl: 0   benzonatate (TESSALON) 100 MG capsule, Take 1 capsule (100 mg total) by mouth every 8  (eight) hours., Disp: 21 capsule, Rfl: 0   cholecalciferol (VITAMIN D3) 25 MCG (1000 UNIT) tablet, Take 1,000 Units by mouth daily., Disp: , Rfl:    dextroamphetamine (DEXEDRINE SPANSULE) 10 MG 24 hr capsule, Dexedrine Spansule, Disp: , Rfl:    doxycycline (VIBRAMYCIN) 100 MG capsule, Take 1 capsule (100 mg total) by mouth 2 (two) times daily., Disp: 20 capsule, Rfl: 0   famotidine (PEPCID) 20 MG tablet, Take 1 tablet (20 mg total) by mouth 2 (two) times daily., Disp: 60 tablet, Rfl: 0   gabapentin (NEURONTIN) 100 MG capsule, Take 400 mg by mouth at bedtime., Disp: , Rfl:    ibuprofen (ADVIL) 800 MG tablet, Take 1 tablet (800 mg total) by mouth every 8 (eight) hours as needed., Disp: 30 tablet, Rfl: 2   lamoTRIgine (LAMICTAL) 200 MG tablet, Take 200 mg by mouth at bedtime., Disp: , Rfl:    lurasidone (LATUDA) 20 MG TABS tablet, Take 1 tablet (20 mg total) by mouth daily., Disp: 90 tablet, Rfl: 3   magic mouthwash (nystatin, lidocaine, diphenhydrAMINE) suspension, Take 5 mLs by mouth 3 (three) times daily as needed (sore throat)., Disp: 180 mL, Rfl: 0   MAGNESIUM MALATE PO, Take 1 tablet by mouth daily at 6 (six) AM., Disp: , Rfl:    Multiple Vitamin (MULTIVITAMIN ADULT PO), Take 1 tablet by mouth daily., Disp: , Rfl:    omeprazole (PRILOSEC) 20 MG capsule, Take 1 capsule (20 mg total) by mouth daily., Disp: 90 capsule, Rfl: 1   propranolol (INDERAL) 20 MG tablet, Take by mouth., Disp: , Rfl:    vitamin B-12 (CYANOCOBALAMIN) 500 MCG tablet, Take 500 mcg by mouth daily., Disp: , Rfl:    ondansetron (ZOFRAN-ODT) 4 MG disintegrating tablet, Take 1-2 tablets (4-8 mg total) by mouth every 8 (eight) hours as needed. For nausea or dizziness (Patient not taking: Reported on 07/29/2022), Disp: 30 tablet, Rfl: 3   predniSONE (DELTASONE) 20 MG tablet, Take 2 tablets (40 mg total) by mouth daily. (Patient not taking: Reported on 07/29/2022), Disp: 10 tablet, Rfl: 0   promethazine-dextromethorphan  (PROMETHAZINE-DM) 6.25-15 MG/5ML syrup, Take 5 mLs by mouth 4 (four) times daily as needed for cough. (Patient not taking: Reported on 07/29/2022), Disp: 118 mL, Rfl: 0  Allergies  Allergen Reactions   Amoxicillin Shortness Of Breath and Nausea Only    Dizziness   Other     Must have anti-emetic prior to being given any narcotics    Latex Rash    I personally reviewed active problem list, medication list, allergies with the patient/caregiver today.  ROS  Constitutional: Negative for fever or weight change.  HEENT: positive for sore throat Respiratory: Negative for cough and shortness of breath.   Cardiovascular: Negative for chest pain or palpitations.  Gastrointestinal: Negative for abdominal pain, no bowel changes.  Musculoskeletal: Negative for gait problem or joint swelling.  Skin: Negative for rash.  Neurological: Negative for dizziness  or headache.  No other specific complaints in a complete review of systems (except as listed in HPI above).   Objective  Virtual encounter, vitals not obtained.  There is no height or weight on file to calculate BMI.  Nursing Note and Vital Signs reviewed.  Physical Exam  Awake, alert and oriented speaking in complete sentences  No results found for this or any previous visit (from the past 72 hour(s)).  Assessment & Plan  Problem List Items Addressed This Visit       Digestive   Gastroesophageal reflux disease without esophagitis    Continue taking omeprazole 20 mg daily.  Increase Pepcid to 20 mg 2 times a day.      Relevant Medications   famotidine (PEPCID) 20 MG tablet   Other Visit Diagnoses     Sore throat    -  Primary   Start azithromycin and Magic mouthwash.  Continue drinking hot tea with honey.  May also gargle with salt water.   Relevant Medications   azithromycin (ZITHROMAX) 500 MG tablet   magic mouthwash (nystatin, lidocaine, diphenhydrAMINE) suspension        -Red flags and when to present for  emergency care or RTC including fever >101.21F, chest pain, shortness of breath, new/worsening/un-resolving symptoms,  reviewed with patient at time of visit. Follow up and care instructions discussed and provided in AVS. - I discussed the assessment and treatment plan with the patient. The patient was provided an opportunity to ask questions and all were answered. The patient agreed with the plan and demonstrated an understanding of the instructions.  I provided 20 minutes of non-face-to-face time during this encounter.  Bo Merino, FNP

## 2022-07-29 NOTE — Progress Notes (Signed)
Virtual Visit via Video Note  I connected with Janet Rich on 07/29/22 at 12:45 PM EDT by a video enabled telemedicine application and verified that I am speaking with the correct person using two identifiers.  Location: Patient: at her home  Provider: in the office    I discussed the limitations of evaluation and management by telemedicine and the availability of in person appointments. The patient expressed understanding and agreed to proceed.  History of Present Illness: Today, July 30, 2022 SS: Here today for follow-up virtually.  Continues to struggle with frequent migraine headache.  Often with dizziness.  Reports daily headache, 2-3 migraines a week.  Tried prescription strength 800 mg ibuprofen, Excedrin Migraine.  In the remote past has taken Imitrex 25 mg with good effect, but made her sleepy.  Current migraines reports dizziness, sensitivity to light, sound, nausea.  She is a stay-at-home mom, has a 66-year-old, set of almost  44-year-old twins.  Has been on few rounds of prednisone, would like to avoid if possible. In March 2023 MRI of the brain with and without contrast with IAC was normal.   12/05/21 Dr. Lucia Gaskins HPI:  Janet Rich is a 26 y.o. female here as requested by Berniece Salines, FNP for new chief complaint migraines with vertigo.  I saw patient in 2021 for different symptoms Right sided facial pain, she has a past medical history of PCOS, depression, headache, back pain, bipolar 2, anxiety and trigeminal neuralgia, myalgia, elevated blood pressure without diagnosis of hypertension, preeclampsia and preterm labor with twins, IBS, GERD, obesity BMI 38, migraines, anxiety and depression.  At the time I saw her she was having some shooting pain into the proximal jaw on and off.  At that time she told us that she had shooting pain that radiated in front of the ear did not really shoot in the face just kind of hurt in front of the ear brief and severe but then pain lingers, she saw  an ENT ongoing for several years, sensitive to touch ongoing daily, at the time she was [redacted] weeks pregnant with twins and she reported decreased hearing on the right side.  Thorough neurologic and physical examination were normal.  We ordered an MRI of the brain and started a Medrol Dosepak at the time, we referred to ENT, patient did not follow-up with [place].   She was referred here by Dr. 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   Patient has a lot of dizziness, positional headaches and migraines, vision changes, can't bend over or move fast makes it worse, room is spinnng, worse in the morning when supine with headaches, she has not been performing her epley maneuvers. She is dizzy every day. Vertigo has been severe. She had vertigo In the past but nothing like this. She is having vision changes, recommended eye doctor (floaters), she has headaches, pulsating/pounding/throbbing  with nausea and light sensitivity, her ear on the right is painful, started with an inner ear infection treated with antibiotics and ear drops. Also having daily chronic headaches and migraines for the last month but usually has a lot of migraines the month. Prior to this only 3 days a month of migraines. She drinks 3 bottles water a day. No weakness. No other focal neurologic deficits, associated symptoms, inciting events or modifiable factors.   Reviewed notes,  labs and imaging from outside physicians, which showed:   09/2021: BUN 10, creat 0.59   MRI brain 05/16/2020: MRI brain (without) demonstrating: personally reviewed imaging and agree - Tiny vessels noted near the right trigeminal nerve cisternal segment.  - Brain parenchyma is otherwise unremarkable.  Personally reviewed and agree.     From a thorough review of records: Medications tried that can be used in migraine management include: Tylenol, amitriptyline, aspirin, Flexeril, Decadron, Benadryl, diclofenac tablets, gabapentin, ibuprofen, Tylenol, Aleve, ketorolac injections, Lamictal, lidocaine injections, meclizine, Mobic tablets, methylprednisolone, Reglan tablets, Mobic, nifedipine (calcium channel blocker similar to verapamil), propranolol contraindicated due to asthma, Zofran, prednisone tablets, Compazine injections, Phenergan tablets and suppositories and injections, Seroquel, Zoloft, Imitrex, tramadol,worse with standing and changing positions.   Observations/Objective: Via virtual visit, is alert and oriented, speech is clear and concise, facial symmetry noted, moves about freely  Assessment and Rich: 1.  Chronic migraine headaches 2.  Bipolar Disorder, Anxiety, Depression  -Start Emgality with 240 mg loading dose, followed 30 days later by 120 mg monthly injection for migraine prevention -Try Imitrex 50 mg as needed for acute headache, if she has too much sleepiness, may try Maxalt, then eventually Nurtec or Ubrelvy -Not able to take antidepressants due to suicidal ideation; currently taking propanolol, Lamictal, gabapentin, Latuda -She has had a hysterectomy -Encouraged to reach out for any problems or concerns  Meds ordered this encounter  Medications   Galcanezumab-gnlm (EMGALITY) 120 MG/ML SOAJ    Sig: Inject 240 mg into the skin once for 1 dose.    Dispense:  2 mL    Refill:  0    Please dispense 2 pens for loading dose. Fill this 1st   Galcanezumab-gnlm (EMGALITY) 120  MG/ML SOAJ    Sig: Inject 120 mg into the skin every 30 (thirty) days.    Dispense:  1.12 mL    Refill:  11    Fill this 2nd   SUMAtriptan (IMITREX) 50 MG tablet    Sig: Take 1 tablet (50 mg total) by mouth every 2 (two) hours as needed for migraine. May repeat in 2 hours if headache persists or recurs.    Dispense:  10 tablet    Refill:  5   Follow Up Instructions: 4-6 months with me January 19, 2023 1:45 office visit    I discussed the assessment and treatment Rich with the patient. The patient was provided an opportunity to ask questions and all were answered. The patient agreed with the Rich and demonstrated an understanding of the instructions.   The patient was advised to call back or seek an in-person evaluation if the symptoms worsen or if the condition fails to improve as anticipated.  Evangeline Dakin, DNP  [facility] [address], [place], [place] [address] [phone]

## 2022-07-30 ENCOUNTER — Other Ambulatory Visit: Payer: Self-pay | Admitting: Nurse Practitioner

## 2022-07-30 ENCOUNTER — Encounter: Payer: Self-pay | Admitting: Emergency Medicine

## 2022-07-30 ENCOUNTER — Telehealth (INDEPENDENT_AMBULATORY_CARE_PROVIDER_SITE_OTHER): Payer: Medicaid Other | Admitting: Neurology

## 2022-07-30 DIAGNOSIS — F419 Anxiety disorder, unspecified: Secondary | ICD-10-CM

## 2022-07-30 DIAGNOSIS — F32A Depression, unspecified: Secondary | ICD-10-CM | POA: Diagnosis not present

## 2022-07-30 DIAGNOSIS — G43709 Chronic migraine without aura, not intractable, without status migrainosus: Secondary | ICD-10-CM

## 2022-07-30 DIAGNOSIS — J029 Acute pharyngitis, unspecified: Secondary | ICD-10-CM

## 2022-07-30 DIAGNOSIS — F3181 Bipolar II disorder: Secondary | ICD-10-CM

## 2022-07-30 MED ORDER — LIDOCAINE VISCOUS HCL 2 % MT SOLN: 5.0000 mL | Freq: Three times a day (TID) | OROMUCOSAL | 0 refills | Status: DC | PRN

## 2022-07-30 MED ORDER — NYSTATIN 100000 UNIT/ML MT SUSP: 5.0000 mL | Freq: Three times a day (TID) | OROMUCOSAL | 0 refills | Status: DC | PRN

## 2022-07-30 MED ORDER — EMGALITY 120 MG/ML ~~LOC~~ SOAJ: 120.0000 mg | SUBCUTANEOUS | 11 refills | Status: DC

## 2022-07-30 MED ORDER — SUMATRIPTAN SUCCINATE 50 MG PO TABS: 50.0000 mg | ORAL_TABLET | ORAL | 5 refills | Status: DC | PRN

## 2022-07-30 MED ORDER — EMGALITY 120 MG/ML ~~LOC~~ SOAJ: 240.0000 mg | Freq: Once | SUBCUTANEOUS | 0 refills | Status: AC

## 2022-07-30 NOTE — Patient Instructions (Signed)
Tameya it was lovely to meet you! I will start Emgality which is a monthly injection for migraine prevention, the first injection will consist of 2 as a loading dose, the subsequent months will just be 1 shot For acute headache try Imitrex, if you do not like this, let me know I will try something else Feel free to message if you need me!

## 2022-08-03 DIAGNOSIS — M9904 Segmental and somatic dysfunction of sacral region: Secondary | ICD-10-CM | POA: Diagnosis not present

## 2022-08-03 DIAGNOSIS — M9903 Segmental and somatic dysfunction of lumbar region: Secondary | ICD-10-CM | POA: Diagnosis not present

## 2022-08-03 DIAGNOSIS — M5386 Other specified dorsopathies, lumbar region: Secondary | ICD-10-CM | POA: Diagnosis not present

## 2022-08-03 DIAGNOSIS — M9905 Segmental and somatic dysfunction of pelvic region: Secondary | ICD-10-CM | POA: Diagnosis not present

## 2022-08-04 ENCOUNTER — Telehealth: Payer: Self-pay | Admitting: Nurse Practitioner

## 2022-08-04 NOTE — Telephone Encounter (Signed)
Copied from [place]. Topic: General - Other >> Aug 04, 2022  2:04 PM Marcellus Scott wrote: Reason for CRM: Pt stated her cat has liver failure and she needs to donate plasma. She needs PCP to sign off and stamp that she is not currently prescribed lithium. Per Rhonda flow coordinator, I advised she can drop off; however, we cannot guarantee PCP will fill out today as she is seeing other patients. It can take up to 7 business days to fill out forms. Pt was crying uncontrollably and stated she needed forms signed today. She declined to speak with NT and stated she would come by and bring the forms.  FYI to office

## 2022-08-04 NOTE — Telephone Encounter (Signed)
Almyra Free spoke to [place] at [employer]

## 2022-08-05 DIAGNOSIS — M5386 Other specified dorsopathies, lumbar region: Secondary | ICD-10-CM | POA: Diagnosis not present

## 2022-08-05 DIAGNOSIS — M9903 Segmental and somatic dysfunction of lumbar region: Secondary | ICD-10-CM | POA: Diagnosis not present

## 2022-08-05 DIAGNOSIS — M9904 Segmental and somatic dysfunction of sacral region: Secondary | ICD-10-CM | POA: Diagnosis not present

## 2022-08-05 DIAGNOSIS — M9905 Segmental and somatic dysfunction of pelvic region: Secondary | ICD-10-CM | POA: Diagnosis not present

## 2022-08-06 DIAGNOSIS — M25572 Pain in left ankle and joints of left foot: Secondary | ICD-10-CM | POA: Diagnosis not present

## 2022-08-06 DIAGNOSIS — M25871 Other specified joint disorders, right ankle and foot: Secondary | ICD-10-CM | POA: Diagnosis not present

## 2022-08-06 DIAGNOSIS — M25671 Stiffness of right ankle, not elsewhere classified: Secondary | ICD-10-CM | POA: Diagnosis not present

## 2022-08-06 DIAGNOSIS — M25571 Pain in right ankle and joints of right foot: Secondary | ICD-10-CM | POA: Diagnosis not present

## 2022-08-07 DIAGNOSIS — F4312 Post-traumatic stress disorder, chronic: Secondary | ICD-10-CM | POA: Diagnosis not present

## 2022-08-07 DIAGNOSIS — F411 Generalized anxiety disorder: Secondary | ICD-10-CM | POA: Diagnosis not present

## 2022-08-07 DIAGNOSIS — F330 Major depressive disorder, recurrent, mild: Secondary | ICD-10-CM | POA: Diagnosis not present

## 2022-08-10 DIAGNOSIS — M9905 Segmental and somatic dysfunction of pelvic region: Secondary | ICD-10-CM | POA: Diagnosis not present

## 2022-08-10 DIAGNOSIS — M9903 Segmental and somatic dysfunction of lumbar region: Secondary | ICD-10-CM | POA: Diagnosis not present

## 2022-08-10 DIAGNOSIS — M9904 Segmental and somatic dysfunction of sacral region: Secondary | ICD-10-CM | POA: Diagnosis not present

## 2022-08-10 DIAGNOSIS — M5386 Other specified dorsopathies, lumbar region: Secondary | ICD-10-CM | POA: Diagnosis not present

## 2022-08-12 DIAGNOSIS — F411 Generalized anxiety disorder: Secondary | ICD-10-CM | POA: Diagnosis not present

## 2022-08-12 DIAGNOSIS — F4312 Post-traumatic stress disorder, chronic: Secondary | ICD-10-CM | POA: Diagnosis not present

## 2022-08-12 DIAGNOSIS — F330 Major depressive disorder, recurrent, mild: Secondary | ICD-10-CM | POA: Diagnosis not present

## 2022-08-17 DIAGNOSIS — M9903 Segmental and somatic dysfunction of lumbar region: Secondary | ICD-10-CM | POA: Diagnosis not present

## 2022-08-17 DIAGNOSIS — M5386 Other specified dorsopathies, lumbar region: Secondary | ICD-10-CM | POA: Diagnosis not present

## 2022-08-17 DIAGNOSIS — M9905 Segmental and somatic dysfunction of pelvic region: Secondary | ICD-10-CM | POA: Diagnosis not present

## 2022-08-17 DIAGNOSIS — M9904 Segmental and somatic dysfunction of sacral region: Secondary | ICD-10-CM | POA: Diagnosis not present

## 2022-08-19 DIAGNOSIS — F4312 Post-traumatic stress disorder, chronic: Secondary | ICD-10-CM | POA: Diagnosis not present

## 2022-08-19 DIAGNOSIS — Z419 Encounter for procedure for purposes other than remedying health state, unspecified: Secondary | ICD-10-CM | POA: Diagnosis not present

## 2022-08-19 DIAGNOSIS — F411 Generalized anxiety disorder: Secondary | ICD-10-CM | POA: Diagnosis not present

## 2022-08-19 DIAGNOSIS — F330 Major depressive disorder, recurrent, mild: Secondary | ICD-10-CM | POA: Diagnosis not present

## 2022-08-24 ENCOUNTER — Ambulatory Visit (LOCAL_COMMUNITY_HEALTH_CENTER): Payer: Medicaid Other

## 2022-08-24 DIAGNOSIS — F330 Major depressive disorder, recurrent, mild: Secondary | ICD-10-CM | POA: Diagnosis not present

## 2022-08-24 DIAGNOSIS — F411 Generalized anxiety disorder: Secondary | ICD-10-CM | POA: Diagnosis not present

## 2022-08-24 DIAGNOSIS — Z719 Counseling, unspecified: Secondary | ICD-10-CM

## 2022-08-24 DIAGNOSIS — F4312 Post-traumatic stress disorder, chronic: Secondary | ICD-10-CM | POA: Diagnosis not present

## 2022-08-24 DIAGNOSIS — Z23 Encounter for immunization: Secondary | ICD-10-CM

## 2022-08-24 NOTE — Progress Notes (Signed)
  Are you feeling sick today? No   Have you ever received a dose of COVID-19 Vaccine? [employer], [place], [place], [place], Other) Yes  If yes, which vaccine and how many doses?   [place], 4   Did you bring the vaccination record card or other documentation?  No   Do you have a health condition or are undergoing treatment that makes you moderately or severely immunocompromised? This would include, but not be limited to: cancer, HIV, organ transplant, immunosuppressive therapy/high-dose corticosteroids, or moderate/severe primary immunodeficiency.  No  Have you received COVID-19 vaccine before or during hematopoietic cell transplant (HCT) or CAR-T-cell therapies? No  Have you ever had an allergic reaction to: (This would include a severe allergic reaction or a reaction that caused hives, swelling, or respiratory distress, including wheezing.) A component of a COVID-19 vaccine or a previous dose of COVID-19 vaccine? Yes, AMOXICILLIN-Shortness of breath   Have you ever had an allergic reaction to another vaccine (other thanCOVID-19 vaccine) or an injectable medication? (This would include a severe allergic reaction or a reaction that caused hives, swelling, or respiratory distress, including wheezing.)   No    Do you have a history of any of the following:  Myocarditis or Pericarditis No  Dermal fillers:  No  Multisystem Inflammatory Syndrome (MIS-C or MIS-A)? No  COVID-19 disease within the past 3 months? No  Vaccinated with monkeypox vaccine in the last 4 weeks? No  Eligible, administered SpikeVax 12y+, monitored, tolerated well. M.Saren Corkern, LPN.

## 2022-08-25 ENCOUNTER — Telehealth: Payer: Self-pay

## 2022-08-25 NOTE — Telephone Encounter (Signed)
A PA for Emgality 120MG /ML auto-injectors has been started on CMM.  (Key: [id number]). It was submitted to [facility].

## 2022-08-26 DIAGNOSIS — M9904 Segmental and somatic dysfunction of sacral region: Secondary | ICD-10-CM | POA: Diagnosis not present

## 2022-08-26 DIAGNOSIS — M9903 Segmental and somatic dysfunction of lumbar region: Secondary | ICD-10-CM | POA: Diagnosis not present

## 2022-08-26 DIAGNOSIS — M5386 Other specified dorsopathies, lumbar region: Secondary | ICD-10-CM | POA: Diagnosis not present

## 2022-08-26 DIAGNOSIS — M9905 Segmental and somatic dysfunction of pelvic region: Secondary | ICD-10-CM | POA: Diagnosis not present

## 2022-08-26 NOTE — Telephone Encounter (Signed)
Please appeal based on my office note: She cannot take antidepressants due to SI, is already taking propranolol and gabapentin  Assessment and Plan: 1.  Chronic migraine headaches 2.  Bipolar Disorder, Anxiety, Depression  -Start Emgality with 240 mg loading dose, followed 30 days later by 120 mg monthly injection for migraine prevention -Try Imitrex 50 mg as needed for acute headache, if she has too much sleepiness, may try Maxalt, then eventually Nurtec or Ubrelvy -Not able to take antidepressants due to suicidal ideation; currently taking propanolol, Lamictal, gabapentin, Latuda -She has had a hysterectomy -Encouraged to reach out for any problems or concerns

## 2022-08-26 NOTE — Telephone Encounter (Signed)
An appeal letter was created and faxed to [facility].

## 2022-08-26 NOTE — Telephone Encounter (Signed)
The PA for Emgality was denied.  We are unable to approve your request for this drug. The following criteria were not met:   Beneficiary has tried and failed at least a month or greater trial of medications from at least 2 different  classes from the following list of oral medications: a. Antidepressants (e.g. amitriptyline, venlafaxine) b. Beta Blockers (e.g. propranolol, metoprolol, timolol, atenolol) c. Anti-epileptics (e.g. valproate, topiramate) d. Angiotensin converting enzyme inhibitors/angiotensin II receptor blockers (e.g. lisinopril, candesartan) e. Calcium Channel Blockers (e.g. verapamil, nimodipine)

## 2022-08-28 DIAGNOSIS — F4312 Post-traumatic stress disorder, chronic: Secondary | ICD-10-CM | POA: Diagnosis not present

## 2022-08-28 DIAGNOSIS — F411 Generalized anxiety disorder: Secondary | ICD-10-CM | POA: Diagnosis not present

## 2022-08-28 DIAGNOSIS — F330 Major depressive disorder, recurrent, mild: Secondary | ICD-10-CM | POA: Diagnosis not present

## 2022-08-31 NOTE — Telephone Encounter (Signed)
[employer] faxed the appeal back stating the timeframe to send an appeal for the denied service is over. The patient must sign the appeal letter.

## 2022-09-02 NOTE — Telephone Encounter (Signed)
I left a VM for the patient to inform her of the denial. I have asked for a call back to discuss options.

## 2022-09-03 DIAGNOSIS — M25572 Pain in left ankle and joints of left foot: Secondary | ICD-10-CM | POA: Diagnosis not present

## 2022-09-03 DIAGNOSIS — M25571 Pain in right ankle and joints of right foot: Secondary | ICD-10-CM | POA: Diagnosis not present

## 2022-09-03 DIAGNOSIS — M25671 Stiffness of right ankle, not elsewhere classified: Secondary | ICD-10-CM | POA: Diagnosis not present

## 2022-09-03 DIAGNOSIS — M25871 Other specified joint disorders, right ankle and foot: Secondary | ICD-10-CM | POA: Diagnosis not present

## 2022-09-03 NOTE — Telephone Encounter (Signed)
I left the patient a VM offering samples.

## 2022-09-03 NOTE — Telephone Encounter (Signed)
I spoke with the patient. She is planning to change insurance companies in the next few weeks. Once she has new insurance she would like to proceed with getting Emgality filled.

## 2022-09-08 ENCOUNTER — Other Ambulatory Visit: Payer: Self-pay

## 2022-09-08 ENCOUNTER — Encounter (HOSPITAL_COMMUNITY): Payer: Self-pay

## 2022-09-08 ENCOUNTER — Emergency Department (HOSPITAL_COMMUNITY)
Admission: EM | Admit: 2022-09-08 | Discharge: 2022-09-08 | Discharge status: Against Medical Advice | Payer: Medicaid Other | Attending: Emergency Medicine | Admitting: Emergency Medicine

## 2022-09-08 DIAGNOSIS — R531 Weakness: Secondary | ICD-10-CM | POA: Insufficient documentation

## 2022-09-08 DIAGNOSIS — R42 Dizziness and giddiness: Secondary | ICD-10-CM | POA: Insufficient documentation

## 2022-09-08 DIAGNOSIS — R110 Nausea: Secondary | ICD-10-CM | POA: Diagnosis not present

## 2022-09-08 DIAGNOSIS — R1110 Vomiting, unspecified: Secondary | ICD-10-CM | POA: Insufficient documentation

## 2022-09-08 DIAGNOSIS — I10 Essential (primary) hypertension: Secondary | ICD-10-CM | POA: Diagnosis not present

## 2022-09-08 DIAGNOSIS — Z5321 Procedure and treatment not carried out due to patient leaving prior to being seen by health care provider: Secondary | ICD-10-CM | POA: Insufficient documentation

## 2022-09-08 DIAGNOSIS — R112 Nausea with vomiting, unspecified: Secondary | ICD-10-CM | POA: Diagnosis not present

## 2022-09-08 LAB — COMPREHENSIVE METABOLIC PANEL
ALT: 15 U/L (ref 0–44)
AST: 17 U/L (ref 15–41)
Albumin: 3.6 g/dL (ref 3.5–5.0)
Alkaline Phosphatase: 58 U/L (ref 38–126)
Anion gap: 5 (ref 5–15)
BUN: 11 mg/dL (ref 6–20)
CO2: 26 mmol/L (ref 22–32)
Calcium: 8.5 mg/dL — ABNORMAL LOW (ref 8.9–10.3)
Chloride: 107 mmol/L (ref 98–111)
Creatinine, Ser: 0.57 mg/dL (ref 0.44–1.00)
GFR, Estimated: >60 mL/min (ref >60)
Glucose, Bld: 114 mg/dL — ABNORMAL HIGH (ref 70–99)
Potassium: 4.5 mmol/L (ref 3.5–5.1)
Sodium: 138 mmol/L (ref 135–145)
Total Bilirubin: 0.7 mg/dL (ref 0.3–1.2)
Total Protein: 6.7 g/dL (ref 6.5–8.1)

## 2022-09-08 LAB — CBC WITH DIFFERENTIAL/PLATELET
Abs Immature Granulocytes: 0.03 10*3/uL (ref 0.00–0.07)
Basophils Absolute: 0 10*3/uL (ref 0.0–0.1)
Basophils Relative: 0 %
Eosinophils Absolute: 0.2 10*3/uL (ref 0.0–0.5)
Eosinophils Relative: 2 %
HCT: 45.5 % (ref 36.0–46.0)
Hemoglobin: 14.8 g/dL (ref 12.0–15.0)
Immature Granulocytes: 0 %
Lymphocytes Relative: 32 %
Lymphs Abs: 3.5 10*3/uL (ref 0.7–4.0)
MCH: 28.7 pg (ref 26.0–34.0)
MCHC: 32.5 g/dL (ref 30.0–36.0)
MCV: 88.3 fL (ref 80.0–100.0)
Monocytes Absolute: 0.6 10*3/uL (ref 0.1–1.0)
Monocytes Relative: 5 %
Neutro Abs: 6.8 10*3/uL (ref 1.7–7.7)
Neutrophils Relative %: 61 %
Platelets: 331 10*3/uL (ref 150–400)
RBC: 5.15 MIL/uL — ABNORMAL HIGH (ref 3.87–5.11)
RDW: 13.1 % (ref 11.5–15.5)
WBC: 11.1 10*3/uL — ABNORMAL HIGH (ref 4.0–10.5)
nRBC: 0 % (ref 0.0–0.2)

## 2022-09-08 NOTE — ED Notes (Signed)
Request IV be removed and t hat she is leaving to follow up with UC in the morning.

## 2022-09-08 NOTE — ED Triage Notes (Addendum)
Arrives EMS from home with weakness after donating plasma earlier today. Became dizzy, lightheaded and several episodes of vomiting.   500cc NS and 4mg  zofran admin pta.

## 2022-09-09 ENCOUNTER — Encounter: Payer: Self-pay | Admitting: Nurse Practitioner

## 2022-09-09 ENCOUNTER — Other Ambulatory Visit: Payer: Self-pay

## 2022-09-09 ENCOUNTER — Telehealth (INDEPENDENT_AMBULATORY_CARE_PROVIDER_SITE_OTHER): Payer: Medicaid Other | Admitting: Nurse Practitioner

## 2022-09-09 DIAGNOSIS — R42 Dizziness and giddiness: Secondary | ICD-10-CM

## 2022-09-09 DIAGNOSIS — F330 Major depressive disorder, recurrent, mild: Secondary | ICD-10-CM | POA: Diagnosis not present

## 2022-09-09 DIAGNOSIS — R112 Nausea with vomiting, unspecified: Secondary | ICD-10-CM | POA: Diagnosis not present

## 2022-09-09 DIAGNOSIS — F4312 Post-traumatic stress disorder, chronic: Secondary | ICD-10-CM | POA: Diagnosis not present

## 2022-09-09 DIAGNOSIS — F411 Generalized anxiety disorder: Secondary | ICD-10-CM | POA: Diagnosis not present

## 2022-09-09 MED ORDER — ONDANSETRON 4 MG PO TBDP: 4.0000 mg | ORAL_TABLET | Freq: Four times a day (QID) | ORAL | 0 refills | Status: DC | PRN

## 2022-09-09 NOTE — Progress Notes (Signed)
Name: Janet Rich   MRN: [id number]    DOB: 20-Mar-1996   Date:09/09/2022       Progress Note  Subjective  Chief Complaint  Chief Complaint  Patient presents with   Follow-up    Seen at ER last night. BS was 27 at home when EMS came it was 101 and at hospital it was elevated     I connected with  Marvel Plan  on 09/09/22 at 11:40 AM EST by a video enabled telemedicine application and verified that I am speaking with the correct person using two identifiers.  I discussed the limitations of evaluation and management by telemedicine and the availability of in person appointments. The patient expressed understanding and agreed to proceed with a virtual visit  Staff also discussed with the patient that there may be a patient responsible charge related to this service. Patient Location: home Provider Location: [facility] Additional Individuals present: alone  HPI  Er follow up/dizziness:  patient reports she went to the er yesterday for dizziness and vomiting after donating plasma.  She received 500 ml NS and zofran 4 mg IV by EMS prior to arrival.  Patient reports she checked her blood sugar at home and it was 27.  She says she called ems and they checked it and said it was 101.  She says that she got her labs drawn at the er but left prior to being seen.  She reports today she is feeling nauseated. She says she is feeling dehydrated but feeling better than yesterday. She denies any fever, or abdominal pain. Patient reports that she feels this is a result of giving plasma, discussed discontinuing plasma donation. Will send in prescription for zofran.  Discussed following a BRAT diet and pushing fluids.    Patient Active Problem List   Diagnosis Date Noted   Chronic migraine w/o aura w/o status migrainosus, not intractable 07/30/2022   Bulging of lumbar intervertebral disc 06/16/2022   Fibromyalgia syndrome 03/09/2022   Chronic back pain 03/09/2022   Excessive daytime sleepiness 11/28/2021    Endometriosis determined by laparoscopy 05/08/2021   Myalgia 03/11/2021   Elevated BP without diagnosis of hypertension 01/08/2021   Dichorionic diamniotic twin pregnancy in third trimester 08/14/2020   Preeclampsia 08/14/2020   Twin pregnancy delivered vaginally 08/14/2020   Preterm labor 06/17/2020   Right trigeminal neuralgia 11/08/2019   Endometriosis 03/27/2019   PCOS (polycystic ovarian syndrome) 03/27/2019   Moderate persistent asthma 03/07/2019   Post-nasal drainage 03/07/2019   Irritable bowel syndrome with diarrhea 03/07/2019   Gastroesophageal reflux disease without esophagitis 03/07/2019   Class 1 obesity due to excess calories with serious comorbidity and body mass index (BMI) of 34.0 to 34.9 in adult 09/02/2018   Bipolar 2 disorder (HCC) 08/12/2018   History of low birth weight 03/11/2018   Vulvar atrophy 03/11/2018   History of migraine headaches 05/01/2015   Anxiety and depression 05/01/2015    Social History   Tobacco Use   Smoking status: Former    Years: 1.00    Types: Cigarettes    Quit date: 12/20/2009    Years since quitting: 12.7   Smokeless tobacco: Never   Tobacco comments:    age [age], quit, 1 cig daily  Substance Use Topics   Alcohol use: Not Currently     Current Outpatient Medications:    acetaminophen (TYLENOL) 500 MG tablet, Take 2 tablets (1,000 mg total) by mouth every 6 (six) hours as needed for moderate pain., Disp: 30 tablet, Rfl: 0  albuterol (PROVENTIL) (2.5 MG/3ML) 0.083% nebulizer solution, Take 3 mLs (2.5 mg total) by nebulization every 6 (six) hours as needed for wheezing or shortness of breath., Disp: 75 mL, Rfl: 12   albuterol (VENTOLIN HFA) 108 (90 Base) MCG/ACT inhaler, Inhale 1-2 puffs into the lungs every 6 (six) hours as needed for wheezing or shortness of breath., Disp: 1 each, Rfl: 3   ALPRAZolam (XANAX) 0.25 MG tablet, Take 0.25 mg by mouth at bedtime as needed for anxiety., Disp: , Rfl:    Ascorbic Acid (VITAMIN C) 100 MG  tablet, Take 100 mg by mouth daily., Disp: , Rfl:    benzonatate (TESSALON) 100 MG capsule, Take 1 capsule (100 mg total) by mouth every 8 (eight) hours., Disp: 21 capsule, Rfl: 0   cholecalciferol (VITAMIN D3) 25 MCG (1000 UNIT) tablet, Take 1,000 Units by mouth daily., Disp: , Rfl:    dextroamphetamine (DEXEDRINE SPANSULE) 10 MG 24 hr capsule, Dexedrine Spansule, Disp: , Rfl:    doxycycline (VIBRAMYCIN) 100 MG capsule, Take 1 capsule (100 mg total) by mouth 2 (two) times daily., Disp: 20 capsule, Rfl: 0   famotidine (PEPCID) 20 MG tablet, Take 1 tablet (20 mg total) by mouth 2 (two) times daily., Disp: 60 tablet, Rfl: 0   gabapentin (NEURONTIN) 100 MG capsule, Take 400 mg by mouth at bedtime., Disp: , Rfl:    Galcanezumab-gnlm (EMGALITY) 120 MG/ML SOAJ, Inject 120 mg into the skin every 30 (thirty) days., Disp: 1.12 mL, Rfl: 11   ibuprofen (ADVIL) 800 MG tablet, Take 1 tablet (800 mg total) by mouth every 8 (eight) hours as needed., Disp: 30 tablet, Rfl: 2   lamoTRIgine (LAMICTAL) 200 MG tablet, Take 200 mg by mouth at bedtime., Disp: , Rfl:    lurasidone (LATUDA) 20 MG TABS tablet, Take 1 tablet (20 mg total) by mouth daily., Disp: 90 tablet, Rfl: 3   magic mouthwash (lidocaine, diphenhydrAMINE, alum & mag hydroxide) suspension, Swish and spit 5 mLs 3 (three) times daily as needed for mouth pain., Disp: 360 mL, Rfl: 0   MAGNESIUM MALATE PO, Take 1 tablet by mouth daily at 6 (six) AM., Disp: , Rfl:    Multiple Vitamin (MULTIVITAMIN ADULT PO), Take 1 tablet by mouth daily., Disp: , Rfl:    omeprazole (PRILOSEC) 20 MG capsule, Take 1 capsule (20 mg total) by mouth daily., Disp: 90 capsule, Rfl: 1   propranolol (INDERAL) 20 MG tablet, Take by mouth., Disp: , Rfl:    SUMAtriptan (IMITREX) 50 MG tablet, Take 1 tablet (50 mg total) by mouth every 2 (two) hours as needed for migraine. May repeat in 2 hours if headache persists or recurs., Disp: 10 tablet, Rfl: 5   vitamin B-12 (CYANOCOBALAMIN) 500 MCG  tablet, Take 500 mcg by mouth daily., Disp: , Rfl:    ondansetron (ZOFRAN-ODT) 4 MG disintegrating tablet, Take 1-2 tablets (4-8 mg total) by mouth every 8 (eight) hours as needed. For nausea or dizziness (Patient not taking: Reported on 07/29/2022), Disp: 30 tablet, Rfl: 3   predniSONE (DELTASONE) 20 MG tablet, Take 2 tablets (40 mg total) by mouth daily. (Patient not taking: Reported on 07/29/2022), Disp: 10 tablet, Rfl: 0   promethazine-dextromethorphan (PROMETHAZINE-DM) 6.25-15 MG/5ML syrup, Take 5 mLs by mouth 4 (four) times daily as needed for cough. (Patient not taking: Reported on 07/29/2022), Disp: 118 mL, Rfl: 0  Allergies  Allergen Reactions   Amoxicillin Shortness Of Breath and Nausea Only    Dizziness   Other     Must  have anti-emetic prior to being given any narcotics    Latex Rash and Other (See Comments)    I personally reviewed active problem list, medication list, allergies with the patient/caregiver today.  ROS  Constitutional: Negative for fever or weight change.  Respiratory: Negative for cough and shortness of breath.   Cardiovascular: Negative for chest pain or palpitations.  Gastrointestinal: Negative for abdominal pain, no bowel changes.  Musculoskeletal: Negative for gait problem or joint swelling.  Skin: Negative for rash.  Neurological: positive for dizziness or negative for  headache.  No other specific complaints in a complete review of systems (except as listed in HPI above).   Objective  Virtual encounter, vitals not obtained.  There is no height or weight on file to calculate BMI.  Nursing Note and Vital Signs reviewed.  Physical Exam  Awake, alert and oriented, speaking in complete sentences.   Results for orders placed or performed during the hospital encounter of 09/08/22 (from the past 72 hour(s))  CBC with Differential     Status: Abnormal   Collection Time: 09/08/22 10:57 PM  Result Value Ref Range   WBC 11.1 (H) 4.0 - 10.5 K/uL   RBC  5.15 (H) 3.87 - 5.11 MIL/uL   Hemoglobin 14.8 12.0 - 15.0 g/dL   HCT 16.145.5 09.636.0 - 04.546.0 %   MCV 88.3 80.0 - 100.0 fL   MCH 28.7 26.0 - 34.0 pg   MCHC 32.5 30.0 - 36.0 g/dL   RDW 40.913.1 81.111.5 - 91.415.5 %   Platelets 331 150 - 400 K/uL   nRBC 0.0 0.0 - 0.2 %   Neutrophils Relative % 61 %   Neutro Abs 6.8 1.7 - 7.7 K/uL   Lymphocytes Relative 32 %   Lymphs Abs 3.5 0.7 - 4.0 K/uL   Monocytes Relative 5 %   Monocytes Absolute 0.6 0.1 - 1.0 K/uL   Eosinophils Relative 2 %   Eosinophils Absolute 0.2 0.0 - 0.5 K/uL   Basophils Relative 0 %   Basophils Absolute 0.0 0.0 - 0.1 K/uL   Immature Granulocytes 0 %   Abs Immature Granulocytes 0.03 0.00 - 0.07 K/uL    Comment: Performed at [facility], [address] [place], [place] [address]  Comprehensive metabolic panel     Status: Abnormal   Collection Time: 09/08/22 10:57 PM  Result Value Ref Range   Sodium 138 135 - 145 mmol/L   Potassium 4.5 3.5 - 5.1 mmol/L   Chloride 107 98 - 111 mmol/L   CO2 26 22 - 32 mmol/L   Glucose, Bld 114 (H) 70 - 99 mg/dL    Comment: Glucose reference range applies only to samples taken after fasting for at least 8 hours.   BUN 11 6 - 20 mg/dL   Creatinine, Ser 6.210.57 0.44 - 1.00 mg/dL   Calcium 8.5 (L) 8.9 - 10.3 mg/dL   Total Protein 6.7 6.5 - 8.1 g/dL   Albumin 3.6 3.5 - 5.0 g/dL   AST 17 15 - 41 U/L   ALT 15 0 - 44 U/L   Alkaline Phosphatase 58 38 - 126 U/L   Total Bilirubin 0.7 0.3 - 1.2 mg/dL   GFR, Estimated >30>60 >86>60 mL/min    Comment: (NOTE) Calculated using the CKD-EPI Creatinine Equation (2021)    Anion gap 5 5 - 15    Comment: Performed at [facility], [address] [place], [place] [address]    Assessment & Plan  Problem List Items Addressed  This Visit   None Visit Diagnoses     Nausea and vomiting, unspecified vomiting type    -  Primary   push fluids, eat bland diet as tolerated, zofran as needed stop plasma donation   Relevant Medications    ondansetron (ZOFRAN-ODT) 4 MG disintegrating tablet   Dizziness       push fluids, eat bland diet as tolerated, zofran as needed stop plasma donation        -Red flags and when to present for emergency care or RTC including fever >101.70F, chest pain, shortness of breath, new/worsening/un-resolving symptoms,  reviewed with patient at time of visit. Follow up and care instructions discussed and provided in AVS. - I discussed the assessment and treatment plan with the patient. The patient was provided an opportunity to ask questions and all were answered. The patient agreed with the plan and demonstrated an understanding of the instructions.  I provided 15 minutes of non-face-to-face time during this encounter.  Berniece Salines, FNP

## 2022-09-14 DIAGNOSIS — M25571 Pain in right ankle and joints of right foot: Secondary | ICD-10-CM | POA: Diagnosis not present

## 2022-09-14 DIAGNOSIS — M25671 Stiffness of right ankle, not elsewhere classified: Secondary | ICD-10-CM | POA: Diagnosis not present

## 2022-09-14 DIAGNOSIS — M25572 Pain in left ankle and joints of left foot: Secondary | ICD-10-CM | POA: Diagnosis not present

## 2022-09-14 DIAGNOSIS — M25871 Other specified joint disorders, right ankle and foot: Secondary | ICD-10-CM | POA: Diagnosis not present

## 2022-09-16 DIAGNOSIS — F330 Major depressive disorder, recurrent, mild: Secondary | ICD-10-CM | POA: Diagnosis not present

## 2022-09-16 DIAGNOSIS — F411 Generalized anxiety disorder: Secondary | ICD-10-CM | POA: Diagnosis not present

## 2022-09-16 DIAGNOSIS — F4312 Post-traumatic stress disorder, chronic: Secondary | ICD-10-CM | POA: Diagnosis not present

## 2022-09-18 DIAGNOSIS — Z419 Encounter for procedure for purposes other than remedying health state, unspecified: Secondary | ICD-10-CM | POA: Diagnosis not present

## 2022-09-24 DIAGNOSIS — F3181 Bipolar II disorder: Secondary | ICD-10-CM | POA: Diagnosis not present

## 2022-09-24 DIAGNOSIS — F900 Attention-deficit hyperactivity disorder, predominantly inattentive type: Secondary | ICD-10-CM | POA: Diagnosis not present

## 2022-09-24 DIAGNOSIS — F411 Generalized anxiety disorder: Secondary | ICD-10-CM | POA: Diagnosis not present

## 2022-09-24 DIAGNOSIS — M797 Fibromyalgia: Secondary | ICD-10-CM | POA: Diagnosis not present

## 2022-09-30 DIAGNOSIS — F330 Major depressive disorder, recurrent, mild: Secondary | ICD-10-CM | POA: Diagnosis not present

## 2022-09-30 DIAGNOSIS — F411 Generalized anxiety disorder: Secondary | ICD-10-CM | POA: Diagnosis not present

## 2022-09-30 DIAGNOSIS — F4312 Post-traumatic stress disorder, chronic: Secondary | ICD-10-CM | POA: Diagnosis not present

## 2022-10-07 DIAGNOSIS — F330 Major depressive disorder, recurrent, mild: Secondary | ICD-10-CM | POA: Diagnosis not present

## 2022-10-07 DIAGNOSIS — F411 Generalized anxiety disorder: Secondary | ICD-10-CM | POA: Diagnosis not present

## 2022-10-07 DIAGNOSIS — F4312 Post-traumatic stress disorder, chronic: Secondary | ICD-10-CM | POA: Diagnosis not present

## 2022-10-19 DIAGNOSIS — Z419 Encounter for procedure for purposes other than remedying health state, unspecified: Secondary | ICD-10-CM | POA: Diagnosis not present

## 2022-10-20 DIAGNOSIS — R234 Changes in skin texture: Secondary | ICD-10-CM | POA: Diagnosis not present

## 2022-10-20 DIAGNOSIS — Z1239 Encounter for other screening for malignant neoplasm of breast: Secondary | ICD-10-CM | POA: Diagnosis not present

## 2022-10-20 DIAGNOSIS — Z803 Family history of malignant neoplasm of breast: Secondary | ICD-10-CM | POA: Diagnosis not present

## 2022-10-20 DIAGNOSIS — N644 Mastodynia: Secondary | ICD-10-CM | POA: Diagnosis not present

## 2022-10-21 ENCOUNTER — Other Ambulatory Visit: Payer: Self-pay | Admitting: Certified Nurse Midwife

## 2022-10-21 DIAGNOSIS — N644 Mastodynia: Secondary | ICD-10-CM

## 2022-10-22 DIAGNOSIS — F411 Generalized anxiety disorder: Secondary | ICD-10-CM | POA: Diagnosis not present

## 2022-10-22 DIAGNOSIS — F4312 Post-traumatic stress disorder, chronic: Secondary | ICD-10-CM | POA: Diagnosis not present

## 2022-10-22 DIAGNOSIS — F330 Major depressive disorder, recurrent, mild: Secondary | ICD-10-CM | POA: Diagnosis not present

## 2022-10-26 DIAGNOSIS — M5136 Other intervertebral disc degeneration, lumbar region: Secondary | ICD-10-CM | POA: Diagnosis not present

## 2022-10-26 DIAGNOSIS — M5126 Other intervertebral disc displacement, lumbar region: Secondary | ICD-10-CM | POA: Diagnosis not present

## 2022-10-26 DIAGNOSIS — R1030 Lower abdominal pain, unspecified: Secondary | ICD-10-CM | POA: Diagnosis not present

## 2022-10-26 DIAGNOSIS — R1032 Left lower quadrant pain: Secondary | ICD-10-CM | POA: Diagnosis not present

## 2022-10-26 DIAGNOSIS — R1031 Right lower quadrant pain: Secondary | ICD-10-CM | POA: Diagnosis not present

## 2022-10-28 DIAGNOSIS — F4312 Post-traumatic stress disorder, chronic: Secondary | ICD-10-CM | POA: Diagnosis not present

## 2022-10-28 DIAGNOSIS — F411 Generalized anxiety disorder: Secondary | ICD-10-CM | POA: Diagnosis not present

## 2022-10-28 DIAGNOSIS — F330 Major depressive disorder, recurrent, mild: Secondary | ICD-10-CM | POA: Diagnosis not present

## 2022-10-30 ENCOUNTER — Other Ambulatory Visit: Payer: Medicaid Other

## 2022-11-02 ENCOUNTER — Telehealth (INDEPENDENT_AMBULATORY_CARE_PROVIDER_SITE_OTHER): Payer: Medicaid Other | Admitting: Family Medicine

## 2022-11-02 ENCOUNTER — Encounter: Payer: Self-pay | Admitting: Family Medicine

## 2022-11-02 VITALS — Ht 64.0 in | Wt 200.0 lb

## 2022-11-02 DIAGNOSIS — J4521 Mild intermittent asthma with (acute) exacerbation: Secondary | ICD-10-CM | POA: Diagnosis not present

## 2022-11-02 DIAGNOSIS — J069 Acute upper respiratory infection, unspecified: Secondary | ICD-10-CM

## 2022-11-02 MED ORDER — BENZONATATE 100 MG PO CAPS: 100.0000 mg | ORAL_CAPSULE | Freq: Three times a day (TID) | ORAL | 0 refills | Status: DC | PRN

## 2022-11-02 MED ORDER — PROMETHAZINE-DM 6.25-15 MG/5ML PO SYRP: 5.0000 mL | ORAL_SOLUTION | Freq: Every evening | ORAL | 0 refills | Status: DC | PRN

## 2022-11-02 MED ORDER — MOMETASONE FURO-FORMOTEROL FUM 100-5 MCG/ACT IN AERO: 2.0000 | INHALATION_SPRAY | Freq: Two times a day (BID) | RESPIRATORY_TRACT | 1 refills | Status: DC

## 2022-11-02 NOTE — Progress Notes (Signed)
Name: Janet Rich   MRN: [id number]    DOB: 1996-01-19   Date:11/02/2022       Progress Note  Subjective  Chief Complaint  Cough  I connected with  Desmond Lope  on 11/02/22 at  3:20 PM EST by a video enabled telemedicine application and verified that I am speaking with the correct person using two identifiers.  I discussed the limitations of evaluation and management by telemedicine and the availability of in person appointments. The patient expressed understanding and agreed to proceed with the virtual visit  Staff also discussed with the patient that there may be a patient responsible charge related to this service. Patient Location: at home  Provider Location: [facility] Additional Individuals present: her twins  HPI  She has twins that were recently diagnosed with RSV, for the past week she has noticed a dry cough some SOB with activity but no wheezing , fever or chills. She has a history of asthma but has to share nebulizer machine with her children. No nausea or vomiting.   Patient Active Problem List   Diagnosis Date Noted   Chronic migraine w/o aura w/o status migrainosus, not intractable 07/30/2022   Bulging of lumbar intervertebral disc 06/16/2022   Fibromyalgia syndrome 03/09/2022   Chronic back pain 03/09/2022   Excessive daytime sleepiness 11/28/2021   Endometriosis determined by laparoscopy 05/08/2021   Myalgia 03/11/2021   Elevated BP without diagnosis of hypertension 01/08/2021   Dichorionic diamniotic twin pregnancy in third trimester 08/14/2020   Preeclampsia 08/14/2020   Twin pregnancy delivered vaginally 08/14/2020   Preterm labor 06/17/2020   Right trigeminal neuralgia 11/08/2019   Endometriosis 03/27/2019   PCOS (polycystic ovarian syndrome) 03/27/2019   Moderate persistent asthma 03/07/2019   Post-nasal drainage 03/07/2019   Irritable bowel syndrome with diarrhea 03/07/2019   Gastroesophageal reflux disease without esophagitis 03/07/2019   Class 1 obesity  due to excess calories with serious comorbidity and body mass index (BMI) of 34.0 to 34.9 in adult 09/02/2018   Bipolar 2 disorder ([place]) 08/12/2018   History of low birth weight 03/11/2018   Vulvar atrophy 03/11/2018   History of migraine headaches 05/01/2015   Anxiety and depression 05/01/2015    Past Surgical History:  Procedure Laterality Date   ABDOMINAL HYSTERECTOMY     FRACTURE SURGERY     ankle surgery with impant    HYSTEROSCOPY WITH D & C N/A 09/22/2019   Procedure: DILATATION AND CURETTAGE /HYSTEROSCOPY;  Surgeon: Ward, Honor Loh, MD;  Location: [facility];  Service: Gynecology;  Laterality: N/A;   LYSIS OF ADHESION N/A 05/08/2021   Procedure: LYSIS OF ADHESION, EXCISION OF ENDOMETRIOSIS;  Surgeon: Benjaman Kindler, MD;  Location: [facility];  Service: Gynecology;  Laterality: N/A;   ROBOTIC ASSISTED DIAGNOSTIC LAPAROSCOPY N/A 02/18/2017   Procedure: ROBOTIC ASSISTED DIAGNOSTIC LAPAROSCOPY,EXCISION AND ABLATION OF ENDOMETRIOSIS,LYSIS OF ADHESIONS;  Surgeon: Brien Few, MD;  Location: [place] [facility];  Service: Gynecology;  Laterality: N/A;   ROBOTIC ASSISTED LAPAROSCOPIC HYSTERECTOMY AND SALPINGECTOMY Bilateral 05/08/2021   Procedure: XI ROBOTIC ASSISTED TOTAL LAPAROSCOPIC HYSTERECTOMY AND SALPINGECTOMY;  Surgeon: Benjaman Kindler, MD;  Location: [facility];  Service: Gynecology;  Laterality: Bilateral;   VAGINAL DELIVERY N/A 08/14/2020   Procedure: VAGINAL DELIVERY;  Surgeon: Ouida Sills Gwen Her, MD;  Location: [facility];  Service: Obstetrics;  Laterality: N/A;   WISDOM TOOTH EXTRACTION  2013    Family History  Problem Relation Age of Onset   Endometriosis Mother    Bipolar disorder Mother    Diabetes Mother  prediabetes   Breast cancer Paternal Grandmother    Cancer Paternal Grandmother    Depression Paternal Grandmother    Schizophrenia Father    Depression Father    Heart disease Neg Hx    Colon cancer Neg Hx    Ovarian cancer Neg Hx     Social History    Socioeconomic History   Marital status: Married    Spouse name: Renae Fickle   Number of children: 3   Years of education: Not on file   Highest education level: Associate degree: academic program  Occupational History   Occupation: unemployeed   Tobacco Use   Smoking status: Former    Years: 1.00    Types: Cigarettes    Quit date: 12/20/2009    Years since quitting: 12.8   Smokeless tobacco: Never   Tobacco comments:    age [age], quit, 1 cig daily  Vaping Use   Vaping Use: Never used  Substance and Sexual Activity   Alcohol use: Not Currently   Drug use: No   Sexual activity: Yes    Partners: Female, Female    Birth control/protection: Surgical    Comment: hysterectomy  Other Topics Concern   Not on file  Social History Narrative   Lives at home with husband, son, and 60 m.o. twin girls    Right handed   Currently [redacted] weeks pregnant with twins (as of 01/17/2020)  ---update 12/05/2021 twins are now 42 months old    Caffeine: every once in awhile    Social Determinants of Health   Financial Resource Strain: Low Risk  (08/12/2018)   Overall Financial Resource Strain (CARDIA)    Difficulty of Paying Living Expenses: Not hard at all  Food Insecurity: No Food Insecurity (08/12/2018)   Hunger Vital Sign    Worried About Running Out of Food in the Last Year: Never true    Ran Out of Food in the Last Year: Never true  Transportation Needs: No Transportation Needs (08/12/2018)   PRAPARE - Administrator, Civil Service (Medical): No    Lack of Transportation (Non-Medical): No  Physical Activity: Inactive (08/12/2018)   Exercise Vital Sign    Days of Exercise per Week: 0 days    Minutes of Exercise per Session: 0 min  Stress: Stress Concern Present (02/28/2020)   [employer] of Occupational Health - Occupational Stress Questionnaire    Feeling of Stress : Rather much  Social Connections: Moderately Isolated (02/28/2020)   Social Connection and Isolation Panel [NHANES]     Frequency of Communication with Friends and Family: More than three times a week    Frequency of Social Gatherings with Friends and Family: More than three times a week    Attends Religious Services: Never    Database administrator or Organizations: No    Attends Banker Meetings: Never    Marital Status: Married  Catering manager Violence: Not At Risk (08/12/2018)   Humiliation, Afraid, Rape, and Kick questionnaire    Fear of Current or Ex-Partner: No    Emotionally Abused: No    Physically Abused: No    Sexually Abused: No     Current Outpatient Medications:    acetaminophen (TYLENOL) 500 MG tablet, Take 2 tablets (1,000 mg total) by mouth every 6 (six) hours as needed for moderate pain., Disp: 30 tablet, Rfl: 0   albuterol (PROVENTIL) (2.5 MG/3ML) 0.083% nebulizer solution, Take 3 mLs (2.5 mg total) by nebulization every 6 (six) hours as needed  for wheezing or shortness of breath., Disp: 75 mL, Rfl: 12   albuterol (VENTOLIN HFA) 108 (90 Base) MCG/ACT inhaler, Inhale 1-2 puffs into the lungs every 6 (six) hours as needed for wheezing or shortness of breath., Disp: 1 each, Rfl: 3   ALPRAZolam (XANAX) 0.25 MG tablet, Take 0.25 mg by mouth at bedtime as needed for anxiety., Disp: , Rfl:    Ascorbic Acid (VITAMIN C) 100 MG tablet, Take 100 mg by mouth daily., Disp: , Rfl:    cholecalciferol (VITAMIN D3) 25 MCG (1000 UNIT) tablet, Take 1,000 Units by mouth daily., Disp: , Rfl:    famotidine (PEPCID) 20 MG tablet, Take 1 tablet (20 mg total) by mouth 2 (two) times daily., Disp: 60 tablet, Rfl: 0   gabapentin (NEURONTIN) 100 MG capsule, Take 400 mg by mouth at bedtime., Disp: , Rfl:    Galcanezumab-gnlm (EMGALITY) 120 MG/ML SOAJ, Inject 120 mg into the skin every 30 (thirty) days., Disp: 1.12 mL, Rfl: 11   ibuprofen (ADVIL) 800 MG tablet, Take 1 tablet (800 mg total) by mouth every 8 (eight) hours as needed., Disp: 30 tablet, Rfl: 2   lamoTRIgine (LAMICTAL) 200 MG tablet, Take  200 mg by mouth at bedtime., Disp: , Rfl:    lurasidone (LATUDA) 20 MG TABS tablet, Take 1 tablet (20 mg total) by mouth daily., Disp: 90 tablet, Rfl: 3   MAGNESIUM MALATE PO, Take 1 tablet by mouth daily at 6 (six) AM., Disp: , Rfl:    mometasone-formoterol (DULERA) 100-5 MCG/ACT AERO, Inhale 2 puffs into the lungs 2 (two) times daily., Disp: 13 g, Rfl: 1   Multiple Vitamin (MULTIVITAMIN ADULT PO), Take 1 tablet by mouth daily., Disp: , Rfl:    omeprazole (PRILOSEC) 20 MG capsule, Take 1 capsule (20 mg total) by mouth daily., Disp: 90 capsule, Rfl: 1   promethazine-dextromethorphan (PROMETHAZINE-DM) 6.25-15 MG/5ML syrup, Take 5 mLs by mouth at bedtime as needed for cough., Disp: 118 mL, Rfl: 0   propranolol (INDERAL) 20 MG tablet, Take by mouth., Disp: , Rfl:    SUMAtriptan (IMITREX) 50 MG tablet, Take 1 tablet (50 mg total) by mouth every 2 (two) hours as needed for migraine. May repeat in 2 hours if headache persists or recurs., Disp: 10 tablet, Rfl: 5   vitamin B-12 (CYANOCOBALAMIN) 500 MCG tablet, Take 500 mcg by mouth daily., Disp: , Rfl:    benzonatate (TESSALON) 100 MG capsule, Take 1 capsule (100 mg total) by mouth 3 (three) times daily as needed for cough., Disp: 40 capsule, Rfl: 0  Allergies  Allergen Reactions   Amoxicillin Shortness Of Breath and Nausea Only    Dizziness   Other     Must have anti-emetic prior to being given any narcotics    Latex Rash and Other (See Comments)    I personally reviewed active problem list, medication list, allergies, family history, social history, health maintenance with the patient/caregiver today.   ROS  Ten systems reviewed and is negative except as mentioned in HPI   Objective  Virtual encounter, vitals not obtained.  Body mass index is 34.33 kg/m.  Physical Exam  Awake, alert and oriented, no acute distress, speaking in full sentences  PHQ2/9:    11/02/2022    2:15 PM 09/09/2022   10:55 AM 07/29/2022   11:31 AM 06/16/2022     1:19 PM 02/24/2022   12:42 PM  Depression screen PHQ 2/9  Decreased Interest 0 0 0 0 2  Down, Depressed, Hopeless 0 0 0  0 2  PHQ - 2 Score 0 0 0 0 4  Altered sleeping 0    2  Tired, decreased energy 0    2  Change in appetite 0    0  Feeling bad or failure about yourself  0    0  Trouble concentrating 0    0  Moving slowly or fidgety/restless 0    0  Suicidal thoughts 0    0  PHQ-9 Score 0    8  Difficult doing work/chores     Somewhat difficult   PHQ-2/9 Result is negative.    Fall Risk:    11/02/2022    2:15 PM 09/09/2022   10:55 AM 07/29/2022   11:30 AM 06/16/2022    1:19 PM 02/24/2022   12:42 PM  Fall Risk   Falls in the past year? 0 0 1 0 1  Number falls in past yr:  0 0 0 1  Injury with Fall?  0 0 0 0  Risk for fall due to : No Fall Risks    History of fall(s)  Follow up Falls prevention discussed Falls evaluation completed Falls evaluation completed Falls evaluation completed Falls evaluation completed     Assessment & Plan  1. Mild intermittent asthma with acute exacerbation  - mometasone-formoterol (DULERA) 100-5 MCG/ACT AERO; Inhale 2 puffs into the lungs 2 (two) times daily.  Dispense: 13 g; Refill: 1  2. URI, acute  - benzonatate (TESSALON) 100 MG capsule; Take 1 capsule (100 mg total) by mouth 3 (three) times daily as needed for cough.  Dispense: 40 capsule; Refill: 0 - promethazine-dextromethorphan (PROMETHAZINE-DM) 6.25-15 MG/5ML syrup; Take 5 mLs by mouth at bedtime as needed for cough.  Dispense: 118 mL; Refill: 0      I discussed the assessment and treatment plan with the patient. The patient was provided an opportunity to ask questions and all were answered. The patient agreed with the plan and demonstrated an understanding of the instructions.  The patient was advised to call back or seek an in-person evaluation if the symptoms worsen or if the condition fails to improve as anticipated.  I provided 15 minutes of non-face-to-face time during this  encounter.

## 2022-11-09 ENCOUNTER — Other Ambulatory Visit: Payer: Medicaid Other

## 2022-11-12 DIAGNOSIS — F411 Generalized anxiety disorder: Secondary | ICD-10-CM | POA: Diagnosis not present

## 2022-11-12 DIAGNOSIS — F330 Major depressive disorder, recurrent, mild: Secondary | ICD-10-CM | POA: Diagnosis not present

## 2022-11-12 DIAGNOSIS — F4312 Post-traumatic stress disorder, chronic: Secondary | ICD-10-CM | POA: Diagnosis not present

## 2022-11-18 DIAGNOSIS — F330 Major depressive disorder, recurrent, mild: Secondary | ICD-10-CM | POA: Diagnosis not present

## 2022-11-18 DIAGNOSIS — F4312 Post-traumatic stress disorder, chronic: Secondary | ICD-10-CM | POA: Diagnosis not present

## 2022-11-18 DIAGNOSIS — F411 Generalized anxiety disorder: Secondary | ICD-10-CM | POA: Diagnosis not present

## 2022-11-19 DIAGNOSIS — Z419 Encounter for procedure for purposes other than remedying health state, unspecified: Secondary | ICD-10-CM | POA: Diagnosis not present

## 2022-11-23 ENCOUNTER — Ambulatory Visit
Admission: RE | Admit: 2022-11-23 | Discharge: 2022-11-23 | Disposition: A | Discharge status: Home / Self Care | Payer: Medicaid Other | Source: Ambulatory Visit | Attending: Certified Nurse Midwife | Admitting: Certified Nurse Midwife

## 2022-11-23 DIAGNOSIS — N644 Mastodynia: Secondary | ICD-10-CM

## 2022-11-27 DIAGNOSIS — F4312 Post-traumatic stress disorder, chronic: Secondary | ICD-10-CM | POA: Diagnosis not present

## 2022-11-27 DIAGNOSIS — F330 Major depressive disorder, recurrent, mild: Secondary | ICD-10-CM | POA: Diagnosis not present

## 2022-11-27 DIAGNOSIS — F411 Generalized anxiety disorder: Secondary | ICD-10-CM | POA: Diagnosis not present

## 2022-12-02 DIAGNOSIS — F4312 Post-traumatic stress disorder, chronic: Secondary | ICD-10-CM | POA: Diagnosis not present

## 2022-12-02 DIAGNOSIS — F330 Major depressive disorder, recurrent, mild: Secondary | ICD-10-CM | POA: Diagnosis not present

## 2022-12-02 DIAGNOSIS — F411 Generalized anxiety disorder: Secondary | ICD-10-CM | POA: Diagnosis not present

## 2022-12-07 DIAGNOSIS — F900 Attention-deficit hyperactivity disorder, predominantly inattentive type: Secondary | ICD-10-CM | POA: Diagnosis not present

## 2022-12-09 DIAGNOSIS — F411 Generalized anxiety disorder: Secondary | ICD-10-CM | POA: Diagnosis not present

## 2022-12-09 DIAGNOSIS — F330 Major depressive disorder, recurrent, mild: Secondary | ICD-10-CM | POA: Diagnosis not present

## 2022-12-11 DIAGNOSIS — M5416 Radiculopathy, lumbar region: Secondary | ICD-10-CM | POA: Diagnosis not present

## 2022-12-11 DIAGNOSIS — M5126 Other intervertebral disc displacement, lumbar region: Secondary | ICD-10-CM | POA: Diagnosis not present

## 2022-12-14 ENCOUNTER — Other Ambulatory Visit: Payer: Self-pay | Admitting: Family Medicine

## 2022-12-14 ENCOUNTER — Ambulatory Visit
Admission: RE | Admit: 2022-12-14 | Discharge: 2022-12-14 | Disposition: A | Discharge status: Home / Self Care | Payer: Medicaid Other | Source: Ambulatory Visit | Attending: Family Medicine | Admitting: Family Medicine

## 2022-12-14 DIAGNOSIS — M5416 Radiculopathy, lumbar region: Secondary | ICD-10-CM | POA: Insufficient documentation

## 2022-12-14 DIAGNOSIS — M4807 Spinal stenosis, lumbosacral region: Secondary | ICD-10-CM | POA: Diagnosis not present

## 2022-12-14 DIAGNOSIS — M545 Low back pain, unspecified: Secondary | ICD-10-CM | POA: Diagnosis not present

## 2022-12-14 DIAGNOSIS — M5126 Other intervertebral disc displacement, lumbar region: Secondary | ICD-10-CM | POA: Diagnosis not present

## 2022-12-14 DIAGNOSIS — M47816 Spondylosis without myelopathy or radiculopathy, lumbar region: Secondary | ICD-10-CM | POA: Diagnosis not present

## 2022-12-16 DIAGNOSIS — F411 Generalized anxiety disorder: Secondary | ICD-10-CM | POA: Diagnosis not present

## 2022-12-16 DIAGNOSIS — F330 Major depressive disorder, recurrent, mild: Secondary | ICD-10-CM | POA: Diagnosis not present

## 2022-12-16 DIAGNOSIS — F4312 Post-traumatic stress disorder, chronic: Secondary | ICD-10-CM | POA: Diagnosis not present

## 2022-12-18 DIAGNOSIS — Z419 Encounter for procedure for purposes other than remedying health state, unspecified: Secondary | ICD-10-CM | POA: Diagnosis not present

## 2022-12-30 DIAGNOSIS — F4312 Post-traumatic stress disorder, chronic: Secondary | ICD-10-CM | POA: Diagnosis not present

## 2022-12-30 DIAGNOSIS — F330 Major depressive disorder, recurrent, mild: Secondary | ICD-10-CM | POA: Diagnosis not present

## 2022-12-30 DIAGNOSIS — F411 Generalized anxiety disorder: Secondary | ICD-10-CM | POA: Diagnosis not present

## 2022-12-31 ENCOUNTER — Telehealth: Payer: Self-pay | Admitting: Nurse Practitioner

## 2022-12-31 NOTE — Telephone Encounter (Signed)
Copied from [place] [id number]. Topic: Referral - Request for Referral >> Dec 31, 2022  2:12 PM Everette C wrote: Has patient seen PCP for this complaint? Yes.   *If NO, is insurance requiring patient see PCP for this issue before PCP can refer them? Referral for which specialty: Behavioral Health / Psychology  Preferred provider/office: Patient has no preference but has stressed the urgency of their request  Reason for referral: Patient would like to address health concerns

## 2022-12-31 NOTE — Telephone Encounter (Signed)
Please advise 

## 2023-01-01 NOTE — Telephone Encounter (Signed)
Called patient and left message on vm

## 2023-01-06 DIAGNOSIS — F4312 Post-traumatic stress disorder, chronic: Secondary | ICD-10-CM | POA: Diagnosis not present

## 2023-01-06 DIAGNOSIS — F411 Generalized anxiety disorder: Secondary | ICD-10-CM | POA: Diagnosis not present

## 2023-01-06 DIAGNOSIS — F330 Major depressive disorder, recurrent, mild: Secondary | ICD-10-CM | POA: Diagnosis not present

## 2023-01-06 NOTE — Telephone Encounter (Signed)
Pt returned Janet Rich provided message from [place] see [place] - 01/06/2023 10:21 AM - Left message for patient to call the office about referral. There is a behavior health number on the back of her card; she can call that number to see which doctor will take her insurance.  Pt stated she has called a few, and most of those she has called that accept her insurance have an 47-month wait time.  Pt requesting a callback from [facility].   Please advise.

## 2023-01-06 NOTE — Telephone Encounter (Signed)
Patient stated she needed a psychiatrist and [facility] do not have one. I Checked back of her card for mental health benefits and told her call the number. They are out 8 months. Please put in referral

## 2023-01-07 NOTE — Progress Notes (Addendum)
BP 122/84   Pulse 87   Temp 98.3 F (36.8 C) (Oral)   Resp 16   Ht 5\' 4"  (1.626 m)   Wt 200 lb 12.8 oz (91.1 kg)   LMP 04/08/2021   SpO2 98%   BMI 34.47 kg/m    Subjective:    [name], female    DOB: 1996/03/13, 27 y.o.   MRN: [id number]  HPI: Janet Rich is a 27 y.o. female  Chief Complaint  Patient presents with   Referral    Psychologist   ADHD   Asthma   Gastroesophageal Reflux   Anxiety   Depression   GERD: she says she has been out of meds for about a month. She says her acid reflux has been terrible.  She was taking omeprazole 20 mg. Pepcid 20 mg two times a day. Will send in refill. She reports her acid reflux has gotten really bad that she occasionally vomits.  She requested nausea medication. Gave her a one time prescription and placed a referral to gi.    Asthma:  she says her breathing has been okay. She says she uses a nebulizer often.  She says she needs a new nebulizer.  She also needed refills on her albuterol and symbicort.  Refills sent in.   Depression/anxiety/bipolar: she is currently seeing psychiatrist.  [facility].  She is currently on Seroquel 20 mg ?, latuda 40 mg daily, lamictal 200 mg daily, propanolol 60 mg two times a day, xanax 0.25 mg as needed.       01/08/2023    9:35 AM 11/02/2022    2:15 PM 09/09/2022   10:55 AM 07/29/2022   11:31 AM 06/16/2022    1:19 PM  Depression screen PHQ 2/9  Decreased Interest 1 0 0 0 0  Down, Depressed, Hopeless 1 0 0 0 0  PHQ - 2 Score 2 0 0 0 0  Altered sleeping 2 0     Tired, decreased energy 2 0     Change in appetite 0 0     Feeling bad or failure about yourself  0 0     Trouble concentrating 2 0     Moving slowly or fidgety/restless 0 0     Suicidal thoughts 0 0     PHQ-9 Score 8 0     Difficult doing work/chores Somewhat difficult           01/08/2023    9:36 AM 11/02/2022    2:15 PM 02/24/2022   12:43 PM 12/17/2021    1:26 PM  GAD 7 : Generalized Anxiety  Score  Nervous, Anxious, on Edge 3 0 3 3  Control/stop worrying 2 0 3 2  Worry too much - different things 2 0 3 2  Trouble relaxing 1 0 3 2  Restless 1 0 3 3  Easily annoyed or irritable 2 0 3 3  Afraid - awful might happen 2 0 3 0  Total GAD 7 Score 13 0 21 15  Anxiety Difficulty Somewhat difficult  Somewhat difficult Very difficult   Fibromyalgia: she reports she is doing okay.  She says she takes gabapentin 400 mg at bed time.  She is sore during the day but she can't take anything during the day because it makes her sleep.  Rheumatology is managing her care.  She needs her handicap plaquered filled out.   ADHD:  she reports she is on dextroamphetamine 10 two times a day.  She gets her  medications from her psychiatrist.  She needs a referral to psychologist.    Relevant past medical, surgical, family and social history reviewed and updated as indicated. Interim medical history since our last visit reviewed. Allergies and medications reviewed and updated.  Review of Systems  Constitutional: Negative for fever or weight change.  Respiratory: Negative for cough and shortness of breath.   Cardiovascular: Negative for chest pain or palpitations.  Gastrointestinal: Negative for abdominal pain, no bowel changes. Positive for acid reflux and vomiting Musculoskeletal: Negative for gait problem or joint swelling.  Skin: Negative for rash.  Neurological: Negative for dizziness or headache.  No other specific complaints in a complete review of systems (except as listed in HPI above).      Objective:    BP 122/84   Pulse 87   Temp 98.3 F (36.8 C) (Oral)   Resp 16   Ht 5\' 4"  (1.626 m)   Wt 200 lb 12.8 oz (91.1 kg)   LMP 04/08/2021   SpO2 98%   BMI 34.47 kg/m   Wt Readings from Last 3 Encounters:  01/08/23 200 lb 12.8 oz (91.1 kg)  11/02/22 200 lb (90.7 kg)  09/08/22 200 lb (90.7 kg)    Physical Exam  Constitutional: Patient appears well-developed and well-nourished. Obese   No distress.  HEENT: head atraumatic, normocephalic, pupils equal and reactive to light, neck supple, throat within normal limits Cardiovascular: Normal rate, regular rhythm and normal heart sounds.  No murmur heard. No BLE edema. Pulmonary/Chest: Effort normal and breath sounds normal. No respiratory distress. Abdominal: Soft.  There is no tenderness. Psychiatric: Patient has a normal mood and affect. behavior is normal. Judgment and thought content normal.   Results for orders placed or performed during the hospital encounter of 09/08/22  CBC with Differential  Result Value Ref Range   WBC 11.1 (H) 4.0 - 10.5 K/uL   RBC 5.15 (H) 3.87 - 5.11 MIL/uL   Hemoglobin 14.8 12.0 - 15.0 g/dL   HCT 45.5 36.0 - 46.0 %   MCV 88.3 80.0 - 100.0 fL   MCH 28.7 26.0 - 34.0 pg   MCHC 32.5 30.0 - 36.0 g/dL   RDW 13.1 11.5 - 15.5 %   Platelets 331 150 - 400 K/uL   nRBC 0.0 0.0 - 0.2 %   Neutrophils Relative % 61 %   Neutro Abs 6.8 1.7 - 7.7 K/uL   Lymphocytes Relative 32 %   Lymphs Abs 3.5 0.7 - 4.0 K/uL   Monocytes Relative 5 %   Monocytes Absolute 0.6 0.1 - 1.0 K/uL   Eosinophils Relative 2 %   Eosinophils Absolute 0.2 0.0 - 0.5 K/uL   Basophils Relative 0 %   Basophils Absolute 0.0 0.0 - 0.1 K/uL   Immature Granulocytes 0 %   Abs Immature Granulocytes 0.03 0.00 - 0.07 K/uL  Comprehensive metabolic panel  Result Value Ref Range   Sodium 138 135 - 145 mmol/L   Potassium 4.5 3.5 - 5.1 mmol/L   Chloride 107 98 - 111 mmol/L   CO2 26 22 - 32 mmol/L   Glucose, Bld 114 (H) 70 - 99 mg/dL   BUN 11 6 - 20 mg/dL   Creatinine, Ser 0.57 0.44 - 1.00 mg/dL   Calcium 8.5 (L) 8.9 - 10.3 mg/dL   Total Protein 6.7 6.5 - 8.1 g/dL   Albumin 3.6 3.5 - 5.0 g/dL   AST 17 15 - 41 U/L   ALT 15 0 - 44 U/L   Alkaline  Phosphatase 58 38 - 126 U/L   Total Bilirubin 0.7 0.3 - 1.2 mg/dL   GFR, Estimated >60 >60 mL/min   Anion gap 5 5 - 15      Assessment & Plan:   Problem List Items Addressed This Visit        Respiratory   Moderate persistent asthma    Patient reports her breathing has been okay. She says she feels like her nebulizer is breaking and she needs a new one.  She also needed refills on her albuterol and symbicort.  Refills sent in.       Relevant Medications   budesonide-formoterol (SYMBICORT) 160-4.5 MCG/ACT inhaler   albuterol (VENTOLIN HFA) 108 (90 Base) MCG/ACT inhaler   albuterol (PROVENTIL) (2.5 MG/3ML) 0.083% nebulizer solution   Other Relevant Orders   For home use only DME Nebulizer machine     Digestive   Gastroesophageal reflux disease without esophagitis - Primary    Patient reports worsening acid reflux, she has been out of medication for a few months. Refills sent, will also get h. Pylori test and refer to gi.  Since she is now vomiting due to her acid reflux.       Relevant Medications   omeprazole (PRILOSEC) 20 MG capsule   famotidine (PEPCID) 20 MG tablet   promethazine (PHENERGAN) 25 MG tablet   Other Relevant Orders   Ambulatory referral to Gastroenterology   H. pylori breath test     Other   Bipolar 2 disorder [facility])    she is currently seeing psychiatrist.  [facility].  She is currently on Seroquel 20 mg ?, latuda 40 mg daily, lamictal 200 mg daily, propanolol 60 mg two times a day, xanax 0.25 mg as needed.        Fibromyalgia    No changes, established with rheumatology. Taking gabapentin 400 mg at bed time.       Relevant Medications   cyclobenzaprine (FLEXERIL) 10 MG tablet   Attention deficit hyperactivity disorder (ADHD), combined type    Sees psychiatry, needs referral for psychology for evaluation for school      Relevant Orders   Ambulatory referral to Psychology   Other Visit Diagnoses     Medication management       Relevant Orders   CBC with Differential/Platelet   COMPLETE METABOLIC PANEL WITH GFR   Lipid panel   Hemoglobin A1c   Screening for cholesterol level       Relevant Orders   Lipid panel   Screening  for diabetes mellitus       Relevant Orders   COMPLETE METABOLIC PANEL WITH GFR   Hemoglobin A1c   Screening for deficiency anemia       Relevant Orders   CBC with Differential/Platelet        Follow up plan: Return in about 6 months (around 07/11/2023) for follow up.

## 2023-01-07 NOTE — Telephone Encounter (Signed)
They do have PA's on staff but they are not psychiatrist but they can prescribed medication if needed

## 2023-01-08 ENCOUNTER — Other Ambulatory Visit: Payer: Self-pay

## 2023-01-08 ENCOUNTER — Encounter: Payer: Self-pay | Admitting: Nurse Practitioner

## 2023-01-08 ENCOUNTER — Ambulatory Visit (INDEPENDENT_AMBULATORY_CARE_PROVIDER_SITE_OTHER): Payer: Medicaid Other | Admitting: Nurse Practitioner

## 2023-01-08 ENCOUNTER — Other Ambulatory Visit: Payer: Self-pay | Admitting: Nurse Practitioner

## 2023-01-08 VITALS — BP 122/84 | HR 87 | Temp 98.3°F | Resp 16 | Ht 64.0 in | Wt 200.8 lb

## 2023-01-08 DIAGNOSIS — K219 Gastro-esophageal reflux disease without esophagitis: Secondary | ICD-10-CM

## 2023-01-08 DIAGNOSIS — Z79899 Other long term (current) drug therapy: Secondary | ICD-10-CM | POA: Diagnosis not present

## 2023-01-08 DIAGNOSIS — J4540 Moderate persistent asthma, uncomplicated: Secondary | ICD-10-CM

## 2023-01-08 DIAGNOSIS — Z13220 Encounter for screening for lipoid disorders: Secondary | ICD-10-CM

## 2023-01-08 DIAGNOSIS — M797 Fibromyalgia: Secondary | ICD-10-CM

## 2023-01-08 DIAGNOSIS — F3181 Bipolar II disorder: Secondary | ICD-10-CM

## 2023-01-08 DIAGNOSIS — M5126 Other intervertebral disc displacement, lumbar region: Secondary | ICD-10-CM | POA: Diagnosis not present

## 2023-01-08 DIAGNOSIS — F902 Attention-deficit hyperactivity disorder, combined type: Secondary | ICD-10-CM

## 2023-01-08 DIAGNOSIS — M5416 Radiculopathy, lumbar region: Secondary | ICD-10-CM | POA: Diagnosis not present

## 2023-01-08 DIAGNOSIS — Z131 Encounter for screening for diabetes mellitus: Secondary | ICD-10-CM | POA: Diagnosis not present

## 2023-01-08 DIAGNOSIS — M5136 Other intervertebral disc degeneration, lumbar region: Secondary | ICD-10-CM | POA: Diagnosis not present

## 2023-01-08 DIAGNOSIS — Z130 Encounter for screening for diseases of the blood and blood-forming organs and certain disorders involving the immune mechanism: Secondary | ICD-10-CM

## 2023-01-08 MED ORDER — ALBUTEROL SULFATE HFA 108 (90 BASE) MCG/ACT IN AERS: 1.0000 | INHALATION_SPRAY | Freq: Four times a day (QID) | RESPIRATORY_TRACT | 3 refills | Status: DC | PRN

## 2023-01-08 MED ORDER — OMEPRAZOLE 20 MG PO CPDR: 20.0000 mg | DELAYED_RELEASE_CAPSULE | Freq: Every day | ORAL | 1 refills | Status: DC

## 2023-01-08 MED ORDER — ALBUTEROL SULFATE (2.5 MG/3ML) 0.083% IN NEBU: 2.5000 mg | INHALATION_SOLUTION | Freq: Four times a day (QID) | RESPIRATORY_TRACT | 12 refills | Status: DC | PRN

## 2023-01-08 MED ORDER — FAMOTIDINE 20 MG PO TABS: 20.0000 mg | ORAL_TABLET | Freq: Two times a day (BID) | ORAL | 1 refills | Status: DC

## 2023-01-08 MED ORDER — BUDESONIDE-FORMOTEROL FUMARATE 160-4.5 MCG/ACT IN AERO: 2.0000 | INHALATION_SPRAY | Freq: Two times a day (BID) | RESPIRATORY_TRACT | 3 refills | Status: DC

## 2023-01-08 MED ORDER — PROMETHAZINE HCL 25 MG PO TABS: 25.0000 mg | ORAL_TABLET | Freq: Four times a day (QID) | ORAL | 0 refills | Status: DC | PRN

## 2023-01-08 NOTE — Assessment & Plan Note (Signed)
she is currently seeing psychiatrist.  [facility].  She is currently on Seroquel 20 mg ?, latuda 40 mg daily, lamictal 200 mg daily, propanolol 60 mg two times a day, xanax 0.25 mg as needed.

## 2023-01-08 NOTE — Assessment & Plan Note (Signed)
Patient reports worsening acid reflux, she has been out of medication for a few months. Refills sent, will also get h. Pylori test and refer to gi.  Since she is now vomiting due to her acid reflux.

## 2023-01-08 NOTE — Assessment & Plan Note (Signed)
Patient reports her breathing has been okay. She says she feels like her nebulizer is breaking and she needs a new one.  She also needed refills on her albuterol and symbicort.  Refills sent in.

## 2023-01-08 NOTE — Assessment & Plan Note (Signed)
No changes, established with rheumatology. Taking gabapentin 400 mg at bed time.

## 2023-01-08 NOTE — Assessment & Plan Note (Signed)
Sees psychiatry, needs referral for psychology for evaluation for school

## 2023-01-12 LAB — COMPLETE METABOLIC PANEL WITH GFR
AG Ratio: 1.3 (calc) (ref 1.0–2.5)
ALT: 16 U/L (ref 6–29)
AST: 13 U/L (ref 10–30)
Albumin: 3.9 g/dL (ref 3.6–5.1)
Alkaline phosphatase (APISO): 76 U/L (ref 31–125)
BUN: 9 mg/dL (ref 7–25)
CO2: 26 mmol/L (ref 20–32)
Calcium: 9.5 mg/dL (ref 8.6–10.2)
Chloride: 102 mmol/L (ref 98–110)
Creat: 0.63 mg/dL (ref 0.50–0.96)
Globulin: 3.1 g/dL (calc) (ref 1.9–3.7)
Glucose, Bld: 96 mg/dL (ref 65–99)
Potassium: 4.2 mmol/L (ref 3.5–5.3)
Sodium: 139 mmol/L (ref 135–146)
Total Bilirubin: 0.2 mg/dL (ref 0.2–1.2)
Total Protein: 7 g/dL (ref 6.1–8.1)
eGFR: 124 mL/min/{1.73_m2} (ref >=60)

## 2023-01-12 LAB — CBC WITH DIFFERENTIAL/PLATELET
Absolute Monocytes: 415 cells/uL (ref 200–950)
Basophils Absolute: 7 cells/uL (ref 0–200)
Basophils Relative: 0.1 %
Eosinophils Absolute: 150 cells/uL (ref 15–500)
Eosinophils Relative: 2.2 %
HCT: 41.9 % (ref 35.0–45.0)
Hemoglobin: 13.8 g/dL (ref 11.7–15.5)
Lymphs Abs: 2312 cells/uL (ref 850–3900)
MCH: 29.2 pg (ref 27.0–33.0)
MCHC: 32.9 g/dL (ref 32.0–36.0)
MCV: 88.8 fL (ref 80.0–100.0)
MPV: 9.6 fL (ref 7.5–12.5)
Monocytes Relative: 6.1 %
Neutro Abs: 3917 cells/uL (ref 1500–7800)
Neutrophils Relative %: 57.6 %
Platelets: 317 10*3/uL (ref 140–400)
RBC: 4.72 10*6/uL (ref 3.80–5.10)
RDW: 13 % (ref 11.0–15.0)
Total Lymphocyte: 34 %
WBC: 6.8 10*3/uL (ref 3.8–10.8)

## 2023-01-12 LAB — HEMOGLOBIN A1C
Hgb A1c MFr Bld: 5.4 % of total Hgb (ref <5.7)
Mean Plasma Glucose: 108 mg/dL
eAG (mmol/L): 6 mmol/L

## 2023-01-12 LAB — LIPID PANEL
Cholesterol: 225 mg/dL — ABNORMAL HIGH (ref <200)
HDL: 54 mg/dL (ref >=50)
LDL Cholesterol (Calc): 126 mg/dL (calc) — ABNORMAL HIGH
Non-HDL Cholesterol (Calc): 171 mg/dL (calc) — ABNORMAL HIGH (ref <130)
Total CHOL/HDL Ratio: 4.2 (calc) (ref <5.0)
Triglycerides: 317 mg/dL — ABNORMAL HIGH (ref <150)

## 2023-01-12 LAB — H. PYLORI BREATH TEST: H. pylori Breath Test: NOT DETECTED

## 2023-01-13 DIAGNOSIS — F411 Generalized anxiety disorder: Secondary | ICD-10-CM | POA: Diagnosis not present

## 2023-01-13 DIAGNOSIS — F330 Major depressive disorder, recurrent, mild: Secondary | ICD-10-CM | POA: Diagnosis not present

## 2023-01-15 ENCOUNTER — Other Ambulatory Visit: Payer: Self-pay | Admitting: Nurse Practitioner

## 2023-01-15 ENCOUNTER — Ambulatory Visit: Payer: Medicaid Other | Admitting: Nurse Practitioner

## 2023-01-15 DIAGNOSIS — E782 Mixed hyperlipidemia: Secondary | ICD-10-CM

## 2023-01-18 DIAGNOSIS — Z419 Encounter for procedure for purposes other than remedying health state, unspecified: Secondary | ICD-10-CM | POA: Diagnosis not present

## 2023-01-18 NOTE — Progress Notes (Unsigned)
Patient: Janet Rich Date of Birth: 02-21-1996  Reason for Visit: Follow up History from: Patient Primary Neurologist: [name]  ASSESSMENT AND PLAN 27 y.o. year old female   1.  Chronic migraine headache 2.  Bipolar disorder, anxiety, depression -Start Topamax working up to 50 mg at bedtime for migraine preventative -Try Maxalt 10 mg as needed for acute headache -Previously tried and failed: Antidepressants are contraindicated due to suicidal ideation; currently taking propranolol, Lamictal, gabapentin, Latuda; Imitrex was not helpful, ibuprofen -Next steps: Try again for Emgality, was denied by insurance, we may have to appeal, but based on the previous denial she should meet criteria, but we will try Topamax -She has had a hysterectomy -Encouraged to reach out via MyChart if needed -Follow-up in 6 months or sooner if needed  HISTORY OF PRESENT ILLNESS: Today 01/19/23 Migraines are doing awful, hasn't started Emgality, it was denied, we appealed but she didn't sign the letter, wanted to wait to see if her insurance changed. Not sure if her Medicaid would be continued, will know at the end of the month. Right now 2-3 migraines a week. Takes ibuprofen at night to go to sleep. Under a lot of stress. Finishing up her bachelor's in literary studies then go into [employer]. Migraines are frontal, has n/v, sensitivity to light sound. Imitrex makes her sleepy, nauseated. Her PCP gave her a few phenergan tablets. She had hysterectomy.   HISTORY  July 30, 2022 SS: Here today for follow-up virtually.  Continues to struggle with frequent migraine headache.  Often with dizziness.  Reports daily headache, 2-3 migraines a week.  Tried prescription strength 800 mg ibuprofen, Excedrin Migraine.  In the remote past has taken Imitrex 25 mg with good effect, but made her sleepy.  Current migraines reports dizziniess, sensitivity to light, sound, nausea.  She is a stay-at-home mom, has a  82-year-old, set of almost  80-year-old twins.  Has been on few rounds of prednisone, would like to avoid if possible. In March 2023 MRI of the brain with and without contrast with IAC was normal.    12/05/21 Dr. [name] HPI:  Janet Rich is a 27 y.o. female here as requested by Bo Merino, FNP for new chief complaint migraines with vertigo.  I saw patient in 2021 for different symptoms Right sided facial pain, she has a past medical history of PCOS, depression, headache, back pain, bipolar 2, anxiety and trigeminal neuralgia, myalgia, elevated blood pressure without diagnosis of hypertension, preeclampsia and preterm labor with twins, IBS, GERD, obesity BMI 38, migraines, anxiety and depression.  At the time I saw her she was having some shooting pain into the proximal jaw on and off.  At that time she told us that she had shooting pain that radiated in front of the ear did not really shoot in the face just kind of hurt in front of the ear brief and severe but then pain lingers, she saw an ENT ongoing for several years, sensitive to touch ongoing daily, at the time she was [redacted] weeks pregnant with twins and she reported decreased hearing on the right side.  Thorough neurologic and physical examination were normal.  We ordered an MRI of the brain and started a Medrol Dosepak at the time, we referred to ENT, patient did not follow-up with [place].   She was referred here by Dr. Reece Packer and I reviewed her notes from November 10, 2021, patient reported she has had vertigo for a week, migraines/headaches, especially in the  morning, when she moves her head she gets dizzy, she had this in the past and was seen at ENT and taught the Epley maneuvers, at that point she was referred to neurology and had an MRI as above, she was supposed to return for an MRI with contrast after the birth of her girls but she did not, she says she takes ibuprofen for migraines, it usually works for her, discussed that there are other treatments for  migraine she may want to look into and she agreed to come to neurology.   Patient has a lot of dizziness, positional headaches and migraines, vision changes, can't bend over or move fast makes it worse, room is spinnng, worse in the morning when supine with headaches, she has not been performing her epley maneuvers. She is dizzy every day. Vertigo has been severe. She had vertigo In the past but nothing like this. She is having vision changes, recommended eye doctor (floaters), she has headaches, pulsating/pounding/throbbing with nausea and light sensitivity, her ear on the right is painful, started with an inner ear infection treated with antibiotics and ear drops. Also having daily chronic headaches and migraines for the last month but usually has a lot of migraines the month. Prior to this only 3 days a month of migraines. She drinks 3 bottles water a day. No weakness. No other focal neurologic deficits, associated symptoms, inciting events or modifiable factors.   Reviewed notes, labs and imaging from outside physicians, which showed:   09/2021: BUN 10, creat 0.59   MRI brain 05/16/2020: MRI brain (without) demonstrating: personally reviewed imaging and agree - Tiny vessels noted near the right trigeminal nerve cisternal segment.  - Brain parenchyma is otherwise unremarkable.  Personally reviewed and agree.     From a thorough review of records: Medications tried that can be used in migraine management include: Tylenol, amitriptyline, aspirin, Flexeril, Decadron, Benadryl, diclofenac tablets, gabapentin, ibuprofen, Tylenol, Aleve, ketorolac injections, Lamictal, lidocaine injections, meclizine, Mobic tablets, methylprednisolone, Reglan tablets, Mobic, nifedipine (calcium channel blocker similar to verapamil), propranolol contraindicated due to asthma, Zofran, prednisone tablets, Compazine injections, Phenergan tablets and suppositories and injections, Seroquel, Zoloft, Imitrex, tramadol,worse with  standing and changing positions.  REVIEW OF SYSTEMS: Out of a complete 14 system review of symptoms, the patient complains only of the following symptoms, and all other reviewed systems are negative.  See HPI  ALLERGIES: Allergies  Allergen Reactions   Amoxicillin Shortness Of Breath and Nausea Only    Dizziness   Other     Must have anti-emetic prior to being given any narcotics    Latex Rash and Other (See Comments)    HOME MEDICATIONS: Outpatient Medications Prior to Visit  Medication Sig Dispense Refill   acetaminophen (TYLENOL) 500 MG tablet Take 2 tablets (1,000 mg total) by mouth every 6 (six) hours as needed for moderate pain. 30 tablet 0   albuterol (PROVENTIL) (2.5 MG/3ML) 0.083% nebulizer solution Take 3 mLs (2.5 mg total) by nebulization every 6 (six) hours as needed for wheezing or shortness of breath. 75 mL 12   albuterol (VENTOLIN HFA) 108 (90 Base) MCG/ACT inhaler Inhale 1-2 puffs into the lungs every 6 (six) hours as needed for wheezing or shortness of breath. 1 each 3   ALPRAZolam (XANAX) 0.25 MG tablet Take 0.25 mg by mouth at bedtime as needed for anxiety.     Ascorbic Acid (VITAMIN C) 100 MG tablet Take 100 mg by mouth daily.     budesonide-formoterol (SYMBICORT) 160-4.5 MCG/ACT inhaler Inhale  2 puffs into the lungs 2 (two) times daily. 1 each 3   cholecalciferol (VITAMIN D3) 25 MCG (1000 UNIT) tablet Take 1,000 Units by mouth daily.     cyclobenzaprine (FLEXERIL) 10 MG tablet Take 10 mg by mouth 3 (three) times daily as needed for muscle spasms.     dextroamphetamine (DEXTROSTAT) 10 MG tablet Take 10 mg by mouth 2 (two) times daily.     famotidine (PEPCID) 20 MG tablet Take 1 tablet (20 mg total) by mouth 2 (two) times daily. 180 tablet 1   gabapentin (NEURONTIN) 100 MG capsule Take 400 mg by mouth at bedtime.     ibuprofen (ADVIL) 800 MG tablet Take 1 tablet (800 mg total) by mouth every 8 (eight) hours as needed. 30 tablet 2   lamoTRIgine (LAMICTAL) 200 MG  tablet Take 200 mg by mouth at bedtime.     lurasidone (LATUDA) 20 MG TABS tablet Take 1 tablet (20 mg total) by mouth daily. 90 tablet 3   MAGNESIUM MALATE PO Take 1 tablet by mouth daily at 6 (six) AM.     Multiple Vitamin (MULTIVITAMIN ADULT PO) Take 1 tablet by mouth daily.     omeprazole (PRILOSEC) 20 MG capsule Take 1 capsule (20 mg total) by mouth daily. 90 capsule 1   promethazine (PHENERGAN) 25 MG tablet Take 1 tablet (25 mg total) by mouth every 6 (six) hours as needed for nausea or vomiting. 12 tablet 0   propranolol (INDERAL) 20 MG tablet Take by mouth.     vitamin B-12 (CYANOCOBALAMIN) 500 MCG tablet Take 500 mcg by mouth daily.     No facility-administered medications prior to visit.    PAST MEDICAL HISTORY: Past Medical History:  Diagnosis Date   Anemia    with periods   Ankle fracture    right   Anxiety    Asthma    Back pain    Bipolar 1 disorder    Depression    Bipolar    Endometriosis    Fibromyalgia    GERD (gastroesophageal reflux disease)    Headache    MIGRAINES   IBS (irritable bowel syndrome)    Ovarian cyst    PCOS (polycystic ovarian syndrome)    Pneumonia    age 68   PONV (postoperative nausea and vomiting)    nausea after wisdom teeth extraction   Scoliosis    Vaginal Pap smear, abnormal    bx ok    PAST SURGICAL HISTORY: Past Surgical History:  Procedure Laterality Date   ABDOMINAL HYSTERECTOMY     FRACTURE SURGERY     ankle surgery with impant    HYSTEROSCOPY WITH D & C N/A 09/22/2019   Procedure: DILATATION AND CURETTAGE /HYSTEROSCOPY;  Surgeon: Ward, Honor Loh, MD;  Location: [facility];  Service: Gynecology;  Laterality: N/A;   LYSIS OF ADHESION N/A 05/08/2021   Procedure: LYSIS OF ADHESION, EXCISION OF ENDOMETRIOSIS;  Surgeon: Benjaman Kindler, MD;  Location: [facility];  Service: Gynecology;  Laterality: N/A;   ROBOTIC ASSISTED DIAGNOSTIC LAPAROSCOPY N/A 02/18/2017   Procedure: ROBOTIC ASSISTED DIAGNOSTIC LAPAROSCOPY,EXCISION AND  ABLATION OF ENDOMETRIOSIS,LYSIS OF ADHESIONS;  Surgeon: Brien Few, MD;  Location: [facility];  Service: Gynecology;  Laterality: N/A;   ROBOTIC ASSISTED LAPAROSCOPIC HYSTERECTOMY AND SALPINGECTOMY Bilateral 05/08/2021   Procedure: XI ROBOTIC ASSISTED TOTAL LAPAROSCOPIC HYSTERECTOMY AND SALPINGECTOMY;  Surgeon: Benjaman Kindler, MD;  Location: [facility];  Service: Gynecology;  Laterality: Bilateral;   VAGINAL DELIVERY N/A 08/14/2020   Procedure: VAGINAL DELIVERY;  Surgeon:  Schermerhorn, Gwen Her, MD;  Location: [facility];  Service: Obstetrics;  Laterality: N/A;   WISDOM TOOTH EXTRACTION  2013    FAMILY HISTORY: Family History  Problem Relation Age of Onset   Endometriosis Mother    Bipolar disorder Mother    Diabetes Mother        prediabetes   Breast cancer Paternal Grandmother    Cancer Paternal Grandmother    Depression Paternal Grandmother    Schizophrenia Father    Depression Father    Heart disease Neg Hx    Colon cancer Neg Hx    Ovarian cancer Neg Hx     SOCIAL HISTORY: Social History   Socioeconomic History   Marital status: Married    Spouse name: Eddie Dibbles   Number of children: 3   Years of education: Not on file   Highest education level: Associate degree: academic program  Occupational History   Occupation: unemployeed   Tobacco Use   Smoking status: Former    Years: 1    Types: Cigarettes    Quit date: 12/20/2009    Years since quitting: 13.0   Smokeless tobacco: Never   Tobacco comments:    age [age], quit, 1 cig daily  Vaping Use   Vaping Use: Never used  Substance and Sexual Activity   Alcohol use: Not Currently   Drug use: No   Sexual activity: Yes    Partners: Female, Female    Birth control/protection: Surgical    Comment: hysterectomy  Other Topics Concern   Not on file  Social History Narrative   Lives at home with husband, son, and 50 m.o. twin girls    Right handed   Currently [redacted] weeks pregnant with twins (as of 01/17/2020)  ---update 12/05/2021  twins are now 68 months old    Caffeine: every once in awhile    Social Determinants of Health   Financial Resource Strain: Low Risk  (08/12/2018)   Overall Financial Resource Strain (CARDIA)    Difficulty of Paying Living Expenses: Not hard at all  Food Insecurity: No Food Insecurity (08/12/2018)   Hunger Vital Sign    Worried About Running Out of Food in the Last Year: Never true    Ran Out of Food in the Last Year: Never true  Transportation Needs: No Transportation Needs (08/12/2018)   PRAPARE - Hydrologist (Medical): No    Lack of Transportation (Non-Medical): No  Physical Activity: Inactive (08/12/2018)   Exercise Vital Sign    Days of Exercise per Week: 0 days    Minutes of Exercise per Session: 0 min  Stress: Stress Concern Present (02/28/2020)   Independence    Feeling of Stress : Rather much  Social Connections: Moderately Isolated (02/28/2020)   Social Connection and Isolation Panel [NHANES]    Frequency of Communication with Friends and Family: More than three times a week    Frequency of Social Gatherings with Friends and Family: More than three times a week    Attends Religious Services: Never    Marine scientist or Organizations: No    Attends Archivist Meetings: Never    Marital Status: Married  Human resources officer Violence: Not At Risk (08/12/2018)   Humiliation, Afraid, Rape, and Kick questionnaire    Fear of Current or Ex-Partner: No    Emotionally Abused: No    Physically Abused: No    Sexually Abused: No  PHYSICAL EXAM  Vitals:   01/19/23 1332  BP: 129/70  Pulse: 76  Weight: 200 lb (90.7 kg)  Height: 5\' 4"  (1.626 m)   Body mass index is 34.33 kg/m.  Generalized: Well developed, in no acute distress  Neurological examination  Mentation: Alert oriented to time, place, history taking. Follows all commands speech and language fluent Cranial  nerve II-XII: Pupils were equal round reactive to light. Extraocular movements were full, visual field were full on confrontational test. Facial sensation and strength were normal. Head turning and shoulder shrug  were normal and symmetric. Motor: The motor testing reveals 5 over 5 strength of all 4 extremities. Good symmetric motor tone is noted throughout.  Sensory: Sensory testing is intact to soft touch on all 4 extremities. No evidence of extinction is noted.  Coordination: Cerebellar testing reveals good finger-nose-finger and heel-to-shin bilaterally.  Gait and station: Gait is normal.  Reflexes: Deep tendon reflexes are symmetric and normal bilaterally.   DIAGNOSTIC DATA (LABS, IMAGING, TESTING) - I reviewed patient records, labs, notes, testing and imaging myself where available.  Lab Results  Component Value Date   WBC 6.8 01/08/2023   HGB 13.8 01/08/2023   HCT 41.9 01/08/2023   MCV 88.8 01/08/2023   PLT 317 01/08/2023      Component Value Date/Time   NA 139 01/08/2023 1020   NA 138 08/25/2018 1448   NA 138 07/11/2014 1446   K 4.2 01/08/2023 1020   K 3.8 07/11/2014 1446   CL 102 01/08/2023 1020   CL 102 07/11/2014 1446   CO2 26 01/08/2023 1020   CO2 29 07/11/2014 1446   GLUCOSE 96 01/08/2023 1020   GLUCOSE 89 07/11/2014 1446   BUN 9 01/08/2023 1020   BUN 11 08/25/2018 1448   BUN 10 07/11/2014 1446   CREATININE 0.63 01/08/2023 1020   CALCIUM 9.5 01/08/2023 1020   CALCIUM 9.2 07/11/2014 1446   PROT 7.0 01/08/2023 1020   PROT 6.9 08/25/2018 1448   PROT 7.5 08/26/2013 0821   ALBUMIN 3.6 09/08/2022 2257   ALBUMIN 4.1 08/25/2018 1448   ALBUMIN 3.4 (L) 08/26/2013 0821   AST 13 01/08/2023 1020   AST 17 08/26/2013 0821   ALT 16 01/08/2023 1020   ALT 30 08/26/2013 0821   ALKPHOS 58 09/08/2022 2257   ALKPHOS 102 08/26/2013 0821   BILITOT 0.2 01/08/2023 1020   BILITOT <0.2 08/25/2018 1448   BILITOT 0.2 08/26/2013 0821   GFRNONAA >60 09/08/2022 2257   GFRNONAA 126  12/14/2018 0000   GFRAA >60 03/01/2020 2103   GFRAA 146 12/14/2018 0000   Lab Results  Component Value Date   CHOL 225 (H) 01/08/2023   HDL 54 01/08/2023   LDLCALC 126 (H) 01/08/2023   TRIG 317 (H) 01/08/2023   CHOLHDL 4.2 01/08/2023   Lab Results  Component Value Date   HGBA1C 5.4 01/08/2023   Lab Results  Component Value Date   VITAMINB12 376 03/11/2021   Lab Results  Component Value Date   TSH 0.71 10/09/2021    Butler Denmark, AGNP-C, DNP 01/19/2023, 2:00 PM [facility] [address], [place], [place] [address] [phone]

## 2023-01-19 ENCOUNTER — Ambulatory Visit (INDEPENDENT_AMBULATORY_CARE_PROVIDER_SITE_OTHER): Payer: Medicaid Other | Admitting: Neurology

## 2023-01-19 ENCOUNTER — Encounter: Payer: Self-pay | Admitting: Neurology

## 2023-01-19 VITALS — BP 129/70 | HR 76 | Ht 64.0 in | Wt 200.0 lb

## 2023-01-19 DIAGNOSIS — M5386 Other specified dorsopathies, lumbar region: Secondary | ICD-10-CM | POA: Diagnosis not present

## 2023-01-19 DIAGNOSIS — G43709 Chronic migraine without aura, not intractable, without status migrainosus: Secondary | ICD-10-CM | POA: Diagnosis not present

## 2023-01-19 DIAGNOSIS — M9904 Segmental and somatic dysfunction of sacral region: Secondary | ICD-10-CM | POA: Diagnosis not present

## 2023-01-19 DIAGNOSIS — M9903 Segmental and somatic dysfunction of lumbar region: Secondary | ICD-10-CM | POA: Diagnosis not present

## 2023-01-19 DIAGNOSIS — M9905 Segmental and somatic dysfunction of pelvic region: Secondary | ICD-10-CM | POA: Diagnosis not present

## 2023-01-19 MED ORDER — RIZATRIPTAN BENZOATE 10 MG PO TABS: 10.0000 mg | ORAL_TABLET | ORAL | 5 refills | Status: DC | PRN

## 2023-01-19 MED ORDER — TOPIRAMATE 25 MG PO TABS: ORAL_TABLET | ORAL | 5 refills | Status: DC

## 2023-01-19 NOTE — Patient Instructions (Signed)
Start Topamax for migraine, try maxalt for headache, keep me posted on how you do with the Topamax, if you can't tolerate or doesn't help, will try again for Emgality  Meds ordered this encounter  Medications   topiramate (TOPAMAX) 25 MG tablet    Sig: Take 1 tablet at bedtime x 1 week, then take 2 tablets at bedtime    Dispense:  60 tablet    Refill:  5   rizatriptan (MAXALT) 10 MG tablet    Sig: Take 1 tablet (10 mg total) by mouth as needed for migraine. May repeat in 2 hours if needed    Dispense:  10 tablet    Refill:  5

## 2023-01-20 DIAGNOSIS — F330 Major depressive disorder, recurrent, mild: Secondary | ICD-10-CM | POA: Diagnosis not present

## 2023-01-20 DIAGNOSIS — F411 Generalized anxiety disorder: Secondary | ICD-10-CM | POA: Diagnosis not present

## 2023-01-21 ENCOUNTER — Encounter: Payer: Self-pay | Admitting: Nurse Practitioner

## 2023-01-26 DIAGNOSIS — M9904 Segmental and somatic dysfunction of sacral region: Secondary | ICD-10-CM | POA: Diagnosis not present

## 2023-01-26 DIAGNOSIS — M5386 Other specified dorsopathies, lumbar region: Secondary | ICD-10-CM | POA: Diagnosis not present

## 2023-01-26 DIAGNOSIS — M9903 Segmental and somatic dysfunction of lumbar region: Secondary | ICD-10-CM | POA: Diagnosis not present

## 2023-01-26 DIAGNOSIS — M9905 Segmental and somatic dysfunction of pelvic region: Secondary | ICD-10-CM | POA: Diagnosis not present

## 2023-01-29 ENCOUNTER — Ambulatory Visit
Admission: EM | Admit: 2023-01-29 | Discharge: 2023-01-29 | Disposition: A | Discharge status: Home / Self Care | Payer: Medicaid Other | Attending: Family Medicine | Admitting: Family Medicine

## 2023-01-29 DIAGNOSIS — J029 Acute pharyngitis, unspecified: Secondary | ICD-10-CM | POA: Diagnosis not present

## 2023-01-29 DIAGNOSIS — Z1152 Encounter for screening for COVID-19: Secondary | ICD-10-CM | POA: Diagnosis not present

## 2023-01-29 DIAGNOSIS — J069 Acute upper respiratory infection, unspecified: Secondary | ICD-10-CM

## 2023-01-29 LAB — POCT RAPID STREP A (OFFICE): Rapid Strep A Screen: NEGATIVE

## 2023-01-29 MED ORDER — BENZONATATE 200 MG PO CAPS: 200.0000 mg | ORAL_CAPSULE | Freq: Three times a day (TID) | ORAL | 0 refills | Status: DC | PRN

## 2023-01-29 NOTE — ED Triage Notes (Signed)
Pt presents with non productive cough and sore throat since waking up this morning. 

## 2023-01-29 NOTE — ED Provider Notes (Signed)
[facility]    CSN: [id number] Arrival date & time: 01/29/23  1624      History   Chief Complaint Chief Complaint  Patient presents with   Cough   Sore Throat    HPI Janet Rich is a 27 y.o. female.   Patient is here for sore throat, cough.  Started this morning.  No runny nose, congestion.  No known fevers.  + headache. No n/v.  She did use tessalon perle with some help this morning.    {The patient has been seen in Urgent Care in the last 3 years. :1}    Past Medical History:  Diagnosis Date   Anemia    with periods   Ankle fracture    right   Anxiety    Asthma    Back pain    Bipolar 1 disorder    Depression    Bipolar    Endometriosis    Fibromyalgia    GERD (gastroesophageal reflux disease)    Headache    MIGRAINES   IBS (irritable bowel syndrome)    Ovarian cyst    PCOS (polycystic ovarian syndrome)    Pneumonia    age 66   PONV (postoperative nausea and vomiting)    nausea after wisdom teeth extraction   Scoliosis    Vaginal Pap smear, abnormal    bx ok    Patient Active Problem List   Diagnosis Date Noted   Attention deficit hyperactivity disorder (ADHD), combined type 01/08/2023   Chronic migraine w/o aura w/o status migrainosus, not intractable 07/30/2022   Bulging of lumbar intervertebral disc 06/16/2022   Fibromyalgia 03/09/2022   Chronic back pain 03/09/2022   Excessive daytime sleepiness 11/28/2021   Endometriosis determined by laparoscopy 05/08/2021   Myalgia 03/11/2021   Elevated BP without diagnosis of hypertension 01/08/2021   Dichorionic diamniotic twin pregnancy in third trimester 08/14/2020   Preeclampsia 08/14/2020   Twin pregnancy delivered vaginally 08/14/2020   Preterm labor 06/17/2020   Right trigeminal neuralgia 11/08/2019   Endometriosis 03/27/2019   PCOS (polycystic ovarian syndrome) 03/27/2019   Moderate persistent asthma 03/07/2019   Post-nasal drainage 03/07/2019   Irritable bowel syndrome  with diarrhea 03/07/2019   Gastroesophageal reflux disease without esophagitis 03/07/2019   Class 1 obesity due to excess calories with serious comorbidity and body mass index (BMI) of 34.0 to 34.9 in adult 09/02/2018   Bipolar 2 disorder 08/12/2018   History of low birth weight 03/11/2018   Vulvar atrophy 03/11/2018   History of migraine headaches 05/01/2015   Anxiety and depression 05/01/2015    Past Surgical History:  Procedure Laterality Date   ABDOMINAL HYSTERECTOMY     FRACTURE SURGERY     ankle surgery with impant    HYSTEROSCOPY WITH D & C N/A 09/22/2019   Procedure: DILATATION AND CURETTAGE /HYSTEROSCOPY;  Surgeon: Ward, Elenora Fender, MD;  Location: [facility];  Service: Gynecology;  Laterality: N/A;   LYSIS OF ADHESION N/A 05/08/2021   Procedure: LYSIS OF ADHESION, EXCISION OF ENDOMETRIOSIS;  Surgeon: Christeen Douglas, MD;  Location: [facility];  Service: Gynecology;  Laterality: N/A;   ROBOTIC ASSISTED DIAGNOSTIC LAPAROSCOPY N/A 02/18/2017   Procedure: ROBOTIC ASSISTED DIAGNOSTIC LAPAROSCOPY,EXCISION AND ABLATION OF ENDOMETRIOSIS,LYSIS OF ADHESIONS;  Surgeon: Olivia Mackie, MD;  Location: [facility];  Service: Gynecology;  Laterality: N/A;   ROBOTIC ASSISTED LAPAROSCOPIC HYSTERECTOMY AND SALPINGECTOMY Bilateral 05/08/2021   Procedure: XI ROBOTIC ASSISTED TOTAL LAPAROSCOPIC HYSTERECTOMY AND SALPINGECTOMY;  Surgeon: Christeen Douglas, MD;  Location: [facility];  Service:  Gynecology;  Laterality: Bilateral;   VAGINAL DELIVERY N/A 08/14/2020   Procedure: VAGINAL DELIVERY;  Surgeon: Feliberto Gottron Ihor Austin, MD;  Location: [facility];  Service: Obstetrics;  Laterality: N/A;   WISDOM TOOTH EXTRACTION  2013    OB History     Gravida  2   Para  2   Term  1   Preterm  1   AB  0   Living  3      SAB  0   IAB  0   Ectopic  0   Multiple  1   Live Births  3            Home Medications    Prior to Admission medications   Medication Sig Start Date End Date Taking?  Authorizing Provider  acetaminophen (TYLENOL) 500 MG tablet Take 2 tablets (1,000 mg total) by mouth every 6 (six) hours as needed for moderate pain. 07/11/20   McVey, Prudencio Pair, CNM  albuterol (PROVENTIL) (2.5 MG/3ML) 0.083% nebulizer solution Take 3 mLs (2.5 mg total) by nebulization every 6 (six) hours as needed for wheezing or shortness of breath. 01/08/23   Berniece Salines, FNP  albuterol (VENTOLIN HFA) 108 (90 Base) MCG/ACT inhaler Inhale 1-2 puffs into the lungs every 6 (six) hours as needed for wheezing or shortness of breath. 01/08/23   Berniece Salines, FNP  ALPRAZolam Prudy Feeler) 0.25 MG tablet Take 0.25 mg by mouth at bedtime as needed for anxiety.    [provider]  Ascorbic Acid (VITAMIN C) 100 MG tablet Take 100 mg by mouth daily.    [provider]  budesonide-formoterol (SYMBICORT) 160-4.5 MCG/ACT inhaler Inhale 2 puffs into the lungs 2 (two) times daily. 01/08/23   Berniece Salines, FNP  cholecalciferol (VITAMIN D3) 25 MCG (1000 UNIT) tablet Take 1,000 Units by mouth daily.    [provider]  cyclobenzaprine (FLEXERIL) 10 MG tablet Take 10 mg by mouth 3 (three) times daily as needed for muscle spasms.    [provider]  dextroamphetamine (DEXTROSTAT) 10 MG tablet Take 10 mg by mouth 2 (two) times daily. 01/06/23   [provider]  famotidine (PEPCID) 20 MG tablet Take 1 tablet (20 mg total) by mouth 2 (two) times daily. 01/08/23   Berniece Salines, FNP  gabapentin (NEURONTIN) 100 MG capsule Take 400 mg by mouth at bedtime. 02/22/22   [provider]  ibuprofen (ADVIL) 800 MG tablet Take 1 tablet (800 mg total) by mouth every 8 (eight) hours as needed. 11/10/21   Berniece Salines, FNP  lamoTRIgine (LAMICTAL) 200 MG tablet Take 200 mg by mouth at bedtime. 11/22/21   [provider]  lurasidone (LATUDA) 20 MG TABS tablet Take 1 tablet (20 mg total) by mouth daily. 04/15/21   Gabriel Cirri, NP  MAGNESIUM MALATE PO Take 1 tablet by mouth  daily at 6 (six) AM.    [provider]  Multiple Vitamin (MULTIVITAMIN ADULT PO) Take 1 tablet by mouth daily.    [provider]  omeprazole (PRILOSEC) 20 MG capsule Take 1 capsule (20 mg total) by mouth daily. 01/08/23   Berniece Salines, FNP  promethazine (PHENERGAN) 25 MG tablet Take 1 tablet (25 mg total) by mouth every 6 (six) hours as needed for nausea or vomiting. 01/08/23   Berniece Salines, FNP  propranolol (INDERAL) 20 MG tablet Take by mouth. 02/19/22   [provider]  QUEtiapine (SEROQUEL) 25 MG tablet Take 25 mg by mouth at  bedtime.    [provider]  rizatriptan (MAXALT) 10 MG tablet Take 1 tablet (10 mg total) by mouth as needed for migraine. May repeat in 2 hours if needed 01/19/23   Glean Salvo, NP  topiramate (TOPAMAX) 25 MG tablet Take 1 tablet at bedtime x 1 week, then take 2 tablets at bedtime 01/19/23   Glean Salvo, NP  vitamin B-12 (CYANOCOBALAMIN) 500 MCG tablet Take 500 mcg by mouth daily.    [provider]    Family History Family History  Problem Relation Age of Onset   Endometriosis Mother    Bipolar disorder Mother    Diabetes Mother        prediabetes   Breast cancer Paternal Grandmother    Cancer Paternal Grandmother    Depression Paternal Grandmother    Schizophrenia Father    Depression Father    Heart disease Neg Hx    Colon cancer Neg Hx    Ovarian cancer Neg Hx     Social History Social History   Tobacco Use   Smoking status: Former    Years: 1    Types: Cigarettes    Quit date: 12/20/2009    Years since quitting: 13.1   Smokeless tobacco: Never   Tobacco comments:    age [age], quit, 1 cig daily  Vaping Use   Vaping Use: Never used  Substance Use Topics   Alcohol use: Not Currently   Drug use: No     Allergies   Amoxicillin, Other, and Latex   Review of Systems Review of Systems  Respiratory:  Positive for cough.      Physical Exam Triage Vital Signs ED Triage Vitals  Enc  Vitals Group     BP 01/29/23 1646 123/69     Pulse Rate 01/29/23 1646 81     Resp 01/29/23 1646 18     Temp 01/29/23 1646 98.3 F (36.8 C)     Temp Source 01/29/23 1646 Oral     SpO2 01/29/23 1646 97 %     Weight --      Height --      Head Circumference --      Peak Flow --      Pain Score 01/29/23 1645 6     Pain Loc --      Pain Edu? --      Excl. in GC? --    No data found.  Updated Vital Signs BP 123/69 (BP Location: Left Arm)   Pulse 81   Temp 98.3 F (36.8 C) (Oral)   Resp 18   LMP 04/08/2021   SpO2 97%   Visual Acuity Right Eye Distance:   Left Eye Distance:   Bilateral Distance:    Right Eye Near:   Left Eye Near:    Bilateral Near:     Physical Exam   UC Treatments / Results  Labs (all labs ordered are listed, but only abnormal results are displayed) Labs Reviewed  POCT RAPID STREP A (OFFICE)    EKG   Radiology No results found.  Procedures Procedures (including critical care time)  Medications Ordered in UC Medications - No data to display  Initial Impression / Assessment and Plan / UC Course  I have reviewed the triage vital signs and the nursing notes.  Pertinent labs & imaging results that were available during my care of the patient were reviewed by me and considered in my medical decision making (see chart for details).     ***  Final Clinical Impressions(s) / UC Diagnoses   Final diagnoses:  None   Discharge Instructions   None    ED Prescriptions   None    PDMP not reviewed this encounter.

## 2023-01-29 NOTE — Discharge Instructions (Addendum)
You were seen today for upper respiratory symptoms.  Your strep test was negative, and will be sent for culture.  We have swabbed you for covid. This will be resulted tomorrow and you will see this on mychart.  I have sent out tessalon perles for your cough.  I recommend tylenol/motrin for any sore throat, pain or fever as well.  Follow up if not improving.

## 2023-01-30 LAB — SARS CORONAVIRUS 2 (TAT 6-24 HRS): SARS Coronavirus 2: POSITIVE — AB

## 2023-01-30 LAB — CULTURE, GROUP A STREP (THRC)

## 2023-01-31 ENCOUNTER — Ambulatory Visit
Admission: EM | Admit: 2023-01-31 | Discharge: 2023-01-31 | Disposition: A | Discharge status: Home / Self Care | Payer: Medicaid Other | Attending: Physician Assistant | Admitting: Physician Assistant

## 2023-01-31 DIAGNOSIS — U071 COVID-19: Secondary | ICD-10-CM

## 2023-01-31 LAB — CULTURE, GROUP A STREP (THRC)

## 2023-01-31 MED ORDER — NIRMATRELVIR/RITONAVIR (PAXLOVID)TABLET: 3.0000 | ORAL_TABLET | Freq: Two times a day (BID) | ORAL | 0 refills | Status: DC

## 2023-01-31 MED ORDER — MOLNUPIRAVIR EUA 200MG CAPSULE: 4.0000 | ORAL_CAPSULE | Freq: Two times a day (BID) | ORAL | 0 refills | Status: DC

## 2023-01-31 MED ORDER — MOLNUPIRAVIR EUA 200MG CAPSULE: 4.0000 | ORAL_CAPSULE | Freq: Two times a day (BID) | ORAL | 0 refills | Status: AC

## 2023-01-31 NOTE — ED Triage Notes (Signed)
Pt states cough,congestion,body aches seen here two days ago for the same,  tested positive for Covid.

## 2023-02-01 LAB — CULTURE, GROUP A STREP (THRC)

## 2023-02-02 ENCOUNTER — Ambulatory Visit: Payer: Self-pay | Admitting: *Deleted

## 2023-02-02 NOTE — Telephone Encounter (Signed)
  Per agent: "Pt called in has covid cough, congestion fatigue, mild brain fog. She wants to know if there is something else to do since she hasn't been taking her covid medicine correctly , she has been taking 1 capsule a day instead of 4 a day."      Chief Complaint: Medication Question Symptoms: Covid positive, seen at [facility] Frequency: 01/31/23 Pertinent Negatives: Patient denies  Disposition: ED /[] Urgent Care (no appt availability in office) / Appointment(In office/virtual)/  [facility]/ Home Care/ Refused Recommended Disposition /[] [facility]/  Follow-up with PCP Additional Notes: Pt prescribed Molnupiravir, states was taking incorrectly, one tab daily, started Sat. evening. Did take 4 this AM. Pt questioning if she should continue "Or did I mess it all up."Assured pt still within timeframe but would route to practice for PCPs review.   Please advise  Reason for Disposition  [1] Caller has URGENT medicine question about med that PCP or specialist prescribed AND [2] triager unable to answer question  Answer Assessment - Initial Assessment Questions 1. NAME of MEDICINE: "What medicine(s) are you calling about?"     Molnupiravir 2. QUESTION: "What is your question?" (e.g., double dose of medicine, side effect)     Took incorrectly, OK to keep taking 3. PRESCRIBER: "Who prescribed the medicine?" Reason: if prescribed by specialist, call should be referred to that group.     ED  Protocols used: Medication Question Call-A-AH

## 2023-02-02 NOTE — ED Provider Notes (Signed)
[facility]    CSN: [id number] Arrival date & time: 01/31/23  4098      History   Chief Complaint Chief Complaint  Patient presents with   Cough    HPI Janet Rich is a 27 y.o. female.   Patient reports that she tested positive for COVID patient is requesting treatment.  Patient had a cough and congestion   Cough Cough characteristics:  Non-productive Sputum characteristics:  Nondescript Relieved by:  Nothing Worsened by:  Nothing   Past Medical History:  Diagnosis Date   Anemia    with periods   Ankle fracture    right   Anxiety    Asthma    Back pain    Bipolar 1 disorder    Depression    Bipolar    Endometriosis    Fibromyalgia    GERD (gastroesophageal reflux disease)    Headache    MIGRAINES   IBS (irritable bowel syndrome)    Ovarian cyst    PCOS (polycystic ovarian syndrome)    Pneumonia    age 59   PONV (postoperative nausea and vomiting)    nausea after wisdom teeth extraction   Scoliosis    Vaginal Pap smear, abnormal    bx ok    Patient Active Problem List   Diagnosis Date Noted   Attention deficit hyperactivity disorder (ADHD), combined type 01/08/2023   Chronic migraine w/o aura w/o status migrainosus, not intractable 07/30/2022   Bulging of lumbar intervertebral disc 06/16/2022   Fibromyalgia 03/09/2022   Chronic back pain 03/09/2022   Excessive daytime sleepiness 11/28/2021   Endometriosis determined by laparoscopy 05/08/2021   Myalgia 03/11/2021   Elevated BP without diagnosis of hypertension 01/08/2021   Dichorionic diamniotic twin pregnancy in third trimester 08/14/2020   Preeclampsia 08/14/2020   Twin pregnancy delivered vaginally 08/14/2020   Preterm labor 06/17/2020   Right trigeminal neuralgia 11/08/2019   Endometriosis 03/27/2019   PCOS (polycystic ovarian syndrome) 03/27/2019   Moderate persistent asthma 03/07/2019   Post-nasal drainage 03/07/2019   Irritable bowel syndrome with diarrhea 03/07/2019    Gastroesophageal reflux disease without esophagitis 03/07/2019   Class 1 obesity due to excess calories with serious comorbidity and body mass index (BMI) of 34.0 to 34.9 in adult 09/02/2018   Bipolar 2 disorder 08/12/2018   History of low birth weight 03/11/2018   Vulvar atrophy 03/11/2018   History of migraine headaches 05/01/2015   Anxiety and depression 05/01/2015    Past Surgical History:  Procedure Laterality Date   ABDOMINAL HYSTERECTOMY     FRACTURE SURGERY     ankle surgery with impant    HYSTEROSCOPY WITH D & C N/A 09/22/2019   Procedure: DILATATION AND CURETTAGE /HYSTEROSCOPY;  Surgeon: Ward, Elenora Fender, MD;  Location: [facility];  Service: Gynecology;  Laterality: N/A;   LYSIS OF ADHESION N/A 05/08/2021   Procedure: LYSIS OF ADHESION, EXCISION OF ENDOMETRIOSIS;  Surgeon: Christeen Douglas, MD;  Location: [facility];  Service: Gynecology;  Laterality: N/A;   ROBOTIC ASSISTED DIAGNOSTIC LAPAROSCOPY N/A 02/18/2017   Procedure: ROBOTIC ASSISTED DIAGNOSTIC LAPAROSCOPY,EXCISION AND ABLATION OF ENDOMETRIOSIS,LYSIS OF ADHESIONS;  Surgeon: Olivia Mackie, MD;  Location: [facility];  Service: Gynecology;  Laterality: N/A;   ROBOTIC ASSISTED LAPAROSCOPIC HYSTERECTOMY AND SALPINGECTOMY Bilateral 05/08/2021   Procedure: XI ROBOTIC ASSISTED TOTAL LAPAROSCOPIC HYSTERECTOMY AND SALPINGECTOMY;  Surgeon: Christeen Douglas, MD;  Location: [facility];  Service: Gynecology;  Laterality: Bilateral;   VAGINAL DELIVERY N/A 08/14/2020   Procedure: VAGINAL DELIVERY;  Surgeon: Suzy Bouchard,  MD;  Location: [facility];  Service: Obstetrics;  Laterality: N/A;   WISDOM TOOTH EXTRACTION  2013    OB History     Gravida  2   Para  2   Term  1   Preterm  1   AB  0   Living  3      SAB  0   IAB  0   Ectopic  0   Multiple  1   Live Births  3            Home Medications    Prior to Admission medications   Medication Sig Start Date End Date Taking? Authorizing Provider  acetaminophen  (TYLENOL) 500 MG tablet Take 2 tablets (1,000 mg total) by mouth every 6 (six) hours as needed for moderate pain. 07/11/20   McVey, Prudencio Pair, CNM  albuterol (PROVENTIL) (2.5 MG/3ML) 0.083% nebulizer solution Take 3 mLs (2.5 mg total) by nebulization every 6 (six) hours as needed for wheezing or shortness of breath. 01/08/23   Berniece Salines, FNP  albuterol (VENTOLIN HFA) 108 (90 Base) MCG/ACT inhaler Inhale 1-2 puffs into the lungs every 6 (six) hours as needed for wheezing or shortness of breath. 01/08/23   Berniece Salines, FNP  ALPRAZolam Prudy Feeler) 0.25 MG tablet Take 0.25 mg by mouth at bedtime as needed for anxiety.    [provider]  Ascorbic Acid (VITAMIN C) 100 MG tablet Take 100 mg by mouth daily.    [provider]  benzonatate (TESSALON) 200 MG capsule Take 1 capsule (200 mg total) by mouth 3 (three) times daily as needed for cough. 01/29/23   Piontek, Denny Peon, MD  budesonide-formoterol (SYMBICORT) 160-4.5 MCG/ACT inhaler Inhale 2 puffs into the lungs 2 (two) times daily. 01/08/23   Berniece Salines, FNP  cholecalciferol (VITAMIN D3) 25 MCG (1000 UNIT) tablet Take 1,000 Units by mouth daily.    [provider]  cyclobenzaprine (FLEXERIL) 10 MG tablet Take 10 mg by mouth 3 (three) times daily as needed for muscle spasms.    [provider]  dextroamphetamine (DEXTROSTAT) 10 MG tablet Take 10 mg by mouth 2 (two) times daily. 01/06/23   [provider]  famotidine (PEPCID) 20 MG tablet Take 1 tablet (20 mg total) by mouth 2 (two) times daily. 01/08/23   Berniece Salines, FNP  gabapentin (NEURONTIN) 100 MG capsule Take 400 mg by mouth at bedtime. 02/22/22   [provider]  ibuprofen (ADVIL) 800 MG tablet Take 1 tablet (800 mg total) by mouth every 8 (eight) hours as needed. 11/10/21   Berniece Salines, FNP  lamoTRIgine (LAMICTAL) 200 MG tablet Take 200 mg by mouth at bedtime. 11/22/21   [provider]  lurasidone (LATUDA) 20 MG TABS tablet Take 1  tablet (20 mg total) by mouth daily. 04/15/21   Gabriel Cirri, NP  MAGNESIUM MALATE PO Take 1 tablet by mouth daily at 6 (six) AM.    [provider]  molnupiravir EUA (LAGEVRIO) 200 mg CAPS capsule Take 4 capsules (800 mg total) by mouth 2 (two) times daily for 5 days. 01/31/23 02/05/23  Elson Areas, PA-C  Multiple Vitamin (MULTIVITAMIN ADULT PO) Take 1 tablet by mouth daily.    [provider]  omeprazole (PRILOSEC) 20 MG capsule Take 1 capsule (20 mg total) by mouth daily. 01/08/23   Berniece Salines, FNP  promethazine (PHENERGAN) 25 MG tablet Take 1 tablet (25 mg total) by mouth every 6 (six) hours as needed  for nausea or vomiting. 01/08/23   Berniece Salines, FNP  propranolol (INDERAL) 20 MG tablet Take by mouth. 02/19/22   [provider]  QUEtiapine (SEROQUEL) 25 MG tablet Take 25 mg by mouth at bedtime.    [provider]  rizatriptan (MAXALT) 10 MG tablet Take 1 tablet (10 mg total) by mouth as needed for migraine. May repeat in 2 hours if needed 01/19/23   Glean Salvo, NP  topiramate (TOPAMAX) 25 MG tablet Take 1 tablet at bedtime x 1 week, then take 2 tablets at bedtime 01/19/23   Glean Salvo, NP  vitamin B-12 (CYANOCOBALAMIN) 500 MCG tablet Take 500 mcg by mouth daily.    [provider]    Family History Family History  Problem Relation Age of Onset   Endometriosis Mother    Bipolar disorder Mother    Diabetes Mother        prediabetes   Breast cancer Paternal Grandmother    Cancer Paternal Grandmother    Depression Paternal Grandmother    Schizophrenia Father    Depression Father    Heart disease Neg Hx    Colon cancer Neg Hx    Ovarian cancer Neg Hx     Social History Social History   Tobacco Use   Smoking status: Former    Years: 1    Types: Cigarettes    Quit date: 12/20/2009    Years since quitting: 13.1   Smokeless tobacco: Never   Tobacco comments:    age [age], quit, 1 cig daily  Vaping Use   Vaping Use: Never  used  Substance Use Topics   Alcohol use: Not Currently   Drug use: No     Allergies   Amoxicillin, Other, and Latex   Review of Systems Review of Systems  Respiratory:  Positive for cough.   All other systems reviewed and are negative.    Physical Exam Triage Vital Signs ED Triage Vitals [01/31/23 1054]  Enc Vitals Group     BP 104/61     Pulse Rate 84     Resp 16     Temp 98.5 F (36.9 C)     Temp Source Oral     SpO2 98 %     Weight      Height      Head Circumference      Peak Flow      Pain Score 0     Pain Loc      Pain Edu?      Excl. in GC?    No data found.  Updated Vital Signs BP 104/61 (BP Location: Right Arm)   Pulse 84   Temp 98.5 F (36.9 C) (Oral)   Resp 16   LMP 04/08/2021   SpO2 98%   Visual Acuity Right Eye Distance:   Left Eye Distance:   Bilateral Distance:    Right Eye Near:   Left Eye Near:    Bilateral Near:     Physical Exam Vitals and nursing note reviewed.  Constitutional:      Appearance: She is well-developed.  HENT:     Head: Normocephalic.  Cardiovascular:     Rate and Rhythm: Normal rate.  Pulmonary:     Effort: Pulmonary effort is normal.  Abdominal:     General: There is no distension.  Musculoskeletal:        General: Normal range of motion.     Cervical back: Normal range of motion.  Skin:  General: Skin is warm.  Neurological:     General: No focal deficit present.     Mental Status: She is alert and oriented to person, place, and time.      UC Treatments / Results  Labs (all labs ordered are listed, but only abnormal results are displayed) Labs Reviewed - No data to display  EKG   Radiology No results found.  Procedures Procedures (including critical care time)  Medications Ordered in UC Medications - No data to display  Initial Impression / Assessment and Plan / UC Course  I have reviewed the triage vital signs and the nursing notes.  Pertinent labs & imaging results that were  available during my care of the patient were reviewed by me and considered in my medical decision making (see chart for details).     I counseled patient on signs and symptoms and treatment of COVID.  Patient would like to proceed with treatment.  Patient cannot take Paxlovid. Final Clinical Impressions(s) / UC Diagnoses   Final diagnoses:  COVID   Discharge Instructions   None    ED Prescriptions     Medication Sig Dispense Auth. Provider   nirmatrelvir/ritonavir (PAXLOVID) 20 x 150 MG & 10 x  TABS  (Status: Discontinued) Take 3 tablets by mouth 2 (two) times daily for 5 days. Patient GFR is normal Take nirmatrelvir (150 mg) two tablets twice daily for 5 days and ritonavir (100 mg) one tablet twice daily for 5 days. 30 tablet Darriel Utter K, [place]   nirmatrelvir/ritonavir (PAXLOVID) 20 x 150 MG & 10 x  TABS  (Status: Discontinued) Take 3 tablets by mouth 2 (two) times daily for 5 days. Patient GFR is normal Take nirmatrelvir (150 mg) two tablets twice daily for 5 days and ritonavir (100 mg) one tablet twice daily for 5 days. 30 tablet [name] K, PA-C   molnupiravir EUA (LAGEVRIO) 200 mg CAPS capsule  (Status: Discontinued) Take 4 capsules (800 mg total) by mouth 2 (two) times daily for 5 days. 40 capsule Terez Montee K, PA-C   molnupiravir EUA (LAGEVRIO) 200 mg CAPS capsule  (Status: Discontinued) Take 4 capsules (800 mg total) by mouth 2 (two) times daily for 5 days. 40 capsule Ayris Carano K, PA-C   molnupiravir EUA (LAGEVRIO) 200 mg CAPS capsule Take 4 capsules (800 mg total) by mouth 2 (two) times daily for 5 days. 40 capsule Elson Areas, [place]      PDMP not reviewed this encounter. An After Visit Summary was printed and given to the patient.       Elson Areas, [place] 02/02/23 1527

## 2023-02-17 DIAGNOSIS — F330 Major depressive disorder, recurrent, mild: Secondary | ICD-10-CM | POA: Diagnosis not present

## 2023-02-17 DIAGNOSIS — F411 Generalized anxiety disorder: Secondary | ICD-10-CM | POA: Diagnosis not present

## 2023-02-17 DIAGNOSIS — Z419 Encounter for procedure for purposes other than remedying health state, unspecified: Secondary | ICD-10-CM | POA: Diagnosis not present

## 2023-03-01 DIAGNOSIS — F3181 Bipolar II disorder: Secondary | ICD-10-CM | POA: Diagnosis not present

## 2023-03-01 DIAGNOSIS — F900 Attention-deficit hyperactivity disorder, predominantly inattentive type: Secondary | ICD-10-CM | POA: Diagnosis not present

## 2023-03-02 ENCOUNTER — Encounter: Payer: Self-pay | Admitting: Registered"

## 2023-03-02 ENCOUNTER — Encounter: Payer: Medicaid Other | Attending: Nurse Practitioner | Admitting: Registered"

## 2023-03-02 DIAGNOSIS — E785 Hyperlipidemia, unspecified: Secondary | ICD-10-CM

## 2023-03-02 NOTE — Patient Instructions (Signed)
-   Aim to increase fruit and vegetable. Excellent snack options.   - Have 3 balanced meals daily. See handout as guide.   - Increase physical activity to at least 2x/week.

## 2023-03-02 NOTE — Progress Notes (Signed)
Medical Nutrition Therapy  Appointment Start time: 10:19  Appointment End time: 11:15  Primary concerns today: low-budgeted ideas for lunch and snacks during the day  Referral diagnosis: mixed hyperlipidemia Preferred learning style: no preference indicated Learning readiness: ready, change in progress   NUTRITION ASSESSMENT   Pt arrives stating she had a few falls within the past year and will be resuming chiropractic care for back soon.   States she was told that cholesterol and triglycerides were high and to cut down on saturated fats, sodium, and cholesterol intake. States she has replaced seasonings with Mrs. Dash and other low-sodium seasonings and food items. States she has stopped adding salt to things. States she counted her sodium intake to what it would have normally been, it was about 2000 mg unless she eats fig newtons. States she really likes fig newtons. States she and her family recently had COVID and she is trying to recover and resume eating well. States they eat out often. States she wants to know about fast food restaurants that have healthier, low-sodium options that are affordable. States she doesn't like vegetables. States she needs low budget, fast paced, convenient, low-sodium options.   States she goes to therapy once a week. States she has a walking pad with laptop shelf to use but has not used it yet; bought 2 weeks ago. States there is a chair in the way. States she bought a bike a while ago but it was too high. States she doesn't have time to block out anything and will have to exercise while she is doing something else.   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 Has 3 children (ages 59 and 66 year old twins).    Clinical Medical Hx: GERD, IBS-D, hyperlipidemia Medications: See list Labs: elevated Chol (225), elevated Trig (317) Notable Signs/Symptoms: none  Lifestyle & Dietary Hx  Estimated daily fluid intake: 67  oz Supplements: See list Sleep: 6-10 hrs/night Stress / self-care: therapy, eat marshmallows or nothing Current average weekly physical activity: none reported  24-Hr Dietary Recall First Meal: everything bagel + 2 TBS cream cheese (400-450 mg sodium) Snack:  Second Meal: 3 bites of lettuce with no salt [place] dressing Snack:  Third Meal: Every Plate - parmesan crusted chicken + mashed potatoes + zuchinni; 370 mg sodium  Snack: 4 chewy chocolate chip cookies + milk  Beverages: 1% milk (4 oz), water (3*16.9 oz; 50.7 oz), guava mango juice (12 oz); 67 oz   NUTRITION DIAGNOSIS  NB-1.1 Food and nutrition-related knowledge deficit As related to hyperlipidemia.  As evidenced by verbalizes lack of previous nutrition-related education.   NUTRITION INTERVENTION  Nutrition education (E-1) on the following topics: Nutrition education and counseling. Pt was educated and counseled on high cholesterol/triglycerides, nutritional ways to lower triglycerides, ways to increase fiber intake, and ways to increase physical activity. Discussed budget-friendly ways and fiber-rich options when eating out. Pt was encouraged to continue having 3 meals/day and staying hydrated. Pt agreed with goals listed.   Handouts Provided Include  High Triglycerides Nutrition Therapy  Learning Style & Readiness for Change Teaching method utilized: Visual & Auditory  Demonstrated degree of understanding via: Teach Back  Barriers to learning/adherence to lifestyle change: work-life balance  Goals Established by Pt Aim to increase fruit and vegetable. Excellent snack options.  Have 3 balanced meals daily. See handout as guide.  Increase physical activity to at least 2x/week.    MONITORING & EVALUATION Dietary intake, weekly  physical activity.  Next Steps  Patient is to follow-up in 7 weeks.

## 2023-03-03 DIAGNOSIS — F411 Generalized anxiety disorder: Secondary | ICD-10-CM | POA: Diagnosis not present

## 2023-03-03 DIAGNOSIS — F330 Major depressive disorder, recurrent, mild: Secondary | ICD-10-CM | POA: Diagnosis not present

## 2023-03-10 DIAGNOSIS — F411 Generalized anxiety disorder: Secondary | ICD-10-CM | POA: Diagnosis not present

## 2023-03-10 DIAGNOSIS — F330 Major depressive disorder, recurrent, mild: Secondary | ICD-10-CM | POA: Diagnosis not present

## 2023-03-19 ENCOUNTER — Encounter: Payer: Self-pay | Admitting: Emergency Medicine

## 2023-03-19 ENCOUNTER — Ambulatory Visit
Admission: EM | Admit: 2023-03-19 | Discharge: 2023-03-19 | Disposition: A | Discharge status: Home / Self Care | Payer: Medicaid Other | Attending: Internal Medicine | Admitting: Internal Medicine

## 2023-03-19 DIAGNOSIS — O30043 Twin pregnancy, dichorionic/diamniotic, third trimester: Secondary | ICD-10-CM | POA: Insufficient documentation

## 2023-03-19 DIAGNOSIS — J029 Acute pharyngitis, unspecified: Secondary | ICD-10-CM

## 2023-03-19 DIAGNOSIS — B349 Viral infection, unspecified: Secondary | ICD-10-CM | POA: Diagnosis not present

## 2023-03-19 DIAGNOSIS — Z1152 Encounter for screening for COVID-19: Secondary | ICD-10-CM | POA: Insufficient documentation

## 2023-03-19 LAB — POCT RAPID STREP A (OFFICE): Rapid Strep A Screen: NEGATIVE

## 2023-03-19 MED ORDER — KETOROLAC TROMETHAMINE 60 MG/2ML IM SOLN
15.0000 mg | Freq: Once | INTRAMUSCULAR | Status: AC
  Administered 2023-03-19: 30 mg via INTRAMUSCULAR

## 2023-03-19 MED ORDER — KETOROLAC TROMETHAMINE 30 MG/ML IJ SOLN: 30.0000 mg | Freq: Once | INTRAMUSCULAR | Status: DC

## 2023-03-19 NOTE — ED Provider Notes (Signed)
[facility]    CSN: [id number] Arrival date & time: 03/19/23  1239      History   Chief Complaint Chief Complaint  Patient presents with   Sore Throat    HPI Janet Rich is a 27 y.o. female.   Patient presents with sore throat, headache, nasal congestion that started yesterday.  Reports her children have had similar symptoms.  She denies any fever.  Patient would like strep and COVID testing.  Patient has not taken any medications for symptoms.  Reports history of asthma but has not had to use her albuterol inhaler yet.   Sore Throat    Past Medical History:  Diagnosis Date   Anemia    with periods   Ankle fracture    right   Anxiety    Asthma    Back pain    Bipolar 1 disorder (HCC)    Depression    Bipolar    Endometriosis    Fibromyalgia    GERD (gastroesophageal reflux disease)    Headache    MIGRAINES   Hyperlipidemia    IBS (irritable bowel syndrome)    Ovarian cyst    PCOS (polycystic ovarian syndrome)    Pneumonia    age 23   PONV (postoperative nausea and vomiting)    nausea after wisdom teeth extraction   Scoliosis    Vaginal Pap smear, abnormal    bx ok    Patient Active Problem List   Diagnosis Date Noted   Attention deficit hyperactivity disorder (ADHD), combined type 01/08/2023   Chronic migraine w/o aura w/o status migrainosus, not intractable 07/30/2022   Bulging of lumbar intervertebral disc 06/16/2022   Fibromyalgia 03/09/2022   Chronic back pain 03/09/2022   Excessive daytime sleepiness 11/28/2021   Endometriosis determined by laparoscopy 05/08/2021   Myalgia 03/11/2021   Elevated BP without diagnosis of hypertension 01/08/2021   Dichorionic diamniotic twin pregnancy in third trimester 08/14/2020   Preeclampsia 08/14/2020   Twin pregnancy delivered vaginally 08/14/2020   Preterm labor 06/17/2020   Right trigeminal neuralgia 11/08/2019   Endometriosis 03/27/2019   PCOS (polycystic ovarian syndrome) 03/27/2019    Moderate persistent asthma 03/07/2019   Post-nasal drainage 03/07/2019   Irritable bowel syndrome with diarrhea 03/07/2019   Gastroesophageal reflux disease without esophagitis 03/07/2019   Class 1 obesity due to excess calories with serious comorbidity and body mass index (BMI) of 34.0 to 34.9 in adult 09/02/2018   Bipolar 2 disorder (HCC) 08/12/2018   History of low birth weight 03/11/2018   Vulvar atrophy 03/11/2018   History of migraine headaches 05/01/2015   Anxiety and depression 05/01/2015    Past Surgical History:  Procedure Laterality Date   ABDOMINAL HYSTERECTOMY     FRACTURE SURGERY     ankle surgery with impant    HYSTEROSCOPY WITH D & C N/A 09/22/2019   Procedure: DILATATION AND CURETTAGE /HYSTEROSCOPY;  Surgeon: Ward, Elenora Fender, MD;  Location: [facility];  Service: Gynecology;  Laterality: N/A;   LYSIS OF ADHESION N/A 05/08/2021   Procedure: LYSIS OF ADHESION, EXCISION OF ENDOMETRIOSIS;  Surgeon: Christeen Douglas, MD;  Location: [facility];  Service: Gynecology;  Laterality: N/A;   ROBOTIC ASSISTED DIAGNOSTIC LAPAROSCOPY N/A 02/18/2017   Procedure: ROBOTIC ASSISTED DIAGNOSTIC LAPAROSCOPY,EXCISION AND ABLATION OF ENDOMETRIOSIS,LYSIS OF ADHESIONS;  Surgeon: Olivia Mackie, MD;  Location: [facility];  Service: Gynecology;  Laterality: N/A;   ROBOTIC ASSISTED LAPAROSCOPIC HYSTERECTOMY AND SALPINGECTOMY Bilateral 05/08/2021   Procedure: XI ROBOTIC ASSISTED TOTAL LAPAROSCOPIC HYSTERECTOMY AND SALPINGECTOMY;  Surgeon: Christeen Douglas, MD;  Location: [facility];  Service: Gynecology;  Laterality: Bilateral;   VAGINAL DELIVERY N/A 08/14/2020   Procedure: VAGINAL DELIVERY;  Surgeon: Feliberto Gottron Ihor Austin, MD;  Location: [facility];  Service: Obstetrics;  Laterality: N/A;   WISDOM TOOTH EXTRACTION  2013    OB History     Gravida  2   Para  2   Term  1   Preterm  1   AB  0   Living  3      SAB  0   IAB  0   Ectopic  0   Multiple  1   Live Births  3             Home Medications    Prior to Admission medications   Medication Sig Start Date End Date Taking? Authorizing Provider  acetaminophen (TYLENOL) 500 MG tablet Take 2 tablets (1,000 mg total) by mouth every 6 (six) hours as needed for moderate pain. 07/11/20  Yes McVey, Prudencio Pair, CNM  albuterol (PROVENTIL) (2.5 MG/3ML) 0.083% nebulizer solution Take 3 mLs (2.5 mg total) by nebulization every 6 (six) hours as needed for wheezing or shortness of breath. 01/08/23  Yes Berniece Salines, FNP  albuterol (VENTOLIN HFA) 108 (90 Base) MCG/ACT inhaler Inhale 1-2 puffs into the lungs every 6 (six) hours as needed for wheezing or shortness of breath. 01/08/23  Yes Berniece Salines, FNP  ALPRAZolam Prudy Feeler) 0.25 MG tablet Take 0.25 mg by mouth at bedtime as needed for anxiety.   Yes [provider]  Ascorbic Acid (VITAMIN C) 100 MG tablet Take 100 mg by mouth daily.   Yes [provider]  benzonatate (TESSALON) 200 MG capsule Take 1 capsule (200 mg total) by mouth 3 (three) times daily as needed for cough. 01/29/23  Yes Piontek, Denny Peon, MD  budesonide-formoterol (SYMBICORT) 160-4.5 MCG/ACT inhaler Inhale 2 puffs into the lungs 2 (two) times daily. 01/08/23  Yes Berniece Salines, FNP  cholecalciferol (VITAMIN D3) 25 MCG (1000 UNIT) tablet Take 1,000 Units by mouth daily.   Yes [provider]  cyclobenzaprine (FLEXERIL) 10 MG tablet Take 10 mg by mouth 3 (three) times daily as needed for muscle spasms.   Yes [provider]  dextroamphetamine (DEXTROSTAT) 10 MG tablet Take 10 mg by mouth 2 (two) times daily. 01/06/23  Yes [provider]  famotidine (PEPCID) 20 MG tablet Take 1 tablet (20 mg total) by mouth 2 (two) times daily. 01/08/23  Yes Berniece Salines, FNP  gabapentin (NEURONTIN) 100 MG capsule Take 400 mg by mouth at bedtime. 02/22/22  Yes [provider]  ibuprofen (ADVIL) 800 MG tablet Take 1 tablet (800 mg total) by mouth every 8 (eight) hours as needed.  11/10/21  Yes Berniece Salines, FNP  lamoTRIgine (LAMICTAL) 200 MG tablet Take 200 mg by mouth at bedtime. 11/22/21  Yes [provider]  lurasidone (LATUDA) 20 MG TABS tablet Take 1 tablet (20 mg total) by mouth daily. 04/15/21  Yes Gabriel Cirri, NP  MAGNESIUM MALATE PO Take 1 tablet by mouth daily at 6 (six) AM.   Yes [provider]  Multiple Vitamin (MULTIVITAMIN ADULT PO) Take 1 tablet by mouth daily.   Yes [provider]  omeprazole (PRILOSEC) 20 MG capsule Take 1 capsule (20 mg total) by mouth daily. 01/08/23  Yes Berniece Salines, FNP  promethazine (PHENERGAN) 25 MG tablet Take 1 tablet (25 mg total) by mouth every 6 (six) hours as needed  for nausea or vomiting. 01/08/23  Yes Berniece Salines, FNP  propranolol (INDERAL) 20 MG tablet Take by mouth. 02/19/22  Yes [provider]  QUEtiapine (SEROQUEL) 25 MG tablet Take 25 mg by mouth at bedtime.   Yes [provider]  rizatriptan (MAXALT) 10 MG tablet Take 1 tablet (10 mg total) by mouth as needed for migraine. May repeat in 2 hours if needed 01/19/23  Yes Glean Salvo, NP  topiramate (TOPAMAX) 25 MG tablet Take 1 tablet at bedtime x 1 week, then take 2 tablets at bedtime 01/19/23  Yes Glean Salvo, NP  vitamin B-12 (CYANOCOBALAMIN) 500 MCG tablet Take 500 mcg by mouth daily.   Yes [provider]    Family History Family History  Problem Relation Age of Onset   Endometriosis Mother    Bipolar disorder Mother    Diabetes Mother        prediabetes   Schizophrenia Father    Depression Father    Breast cancer Paternal Grandmother    Cancer Paternal Grandmother    Depression Paternal Grandmother    Hypertension Other    Hyperlipidemia Other    Asthma Other    Heart disease Neg Hx    Colon cancer Neg Hx    Ovarian cancer Neg Hx     Social History Social History   Tobacco Use   Smoking status: Former    Years: 1    Types: Cigarettes    Quit date: 12/20/2009    Years since quitting:  13.2   Smokeless tobacco: Never   Tobacco comments:    age [age], quit, 1 cig daily  Vaping Use   Vaping Use: Never used  Substance Use Topics   Alcohol use: Not Currently   Drug use: No     Allergies   Amoxicillin, Other, and Latex   Review of Systems Review of Systems Per HPI  Physical Exam Triage Vital Signs ED Triage Vitals  Enc Vitals Group     BP 03/19/23 1408 106/71     Pulse Rate 03/19/23 1408 77     Resp 03/19/23 1408 16     Temp 03/19/23 1408 98.6 F (37 C)     Temp Source 03/19/23 1408 Oral     SpO2 03/19/23 1408 98 %     Weight 03/19/23 1409 210 lb (95.3 kg)     Height 03/19/23 1409 5\' 1"  (1.549 m)     Head Circumference --      Peak Flow --      Pain Score 03/19/23 1409 5     Pain Loc --      Pain Edu? --      Excl. in GC? --    No data found.  Updated Vital Signs BP 106/71 (BP Location: Left Arm)   Pulse 77   Temp 98.6 F (37 C) (Oral)   Resp 16   Ht 5\' 1"  (1.549 m)   Wt 210 lb (95.3 kg)   LMP 04/08/2021   SpO2 98%   BMI 39.68 kg/m   Visual Acuity Right Eye Distance:   Left Eye Distance:   Bilateral Distance:    Right Eye Near:   Left Eye Near:    Bilateral Near:     Physical Exam Constitutional:      General: She is not in acute distress.    Appearance: Normal appearance. She is not toxic-appearing or diaphoretic.  HENT:     Head: Normocephalic and atraumatic.  Right Ear: Tympanic membrane and ear canal normal.     Left Ear: Tympanic membrane and ear canal normal.     Nose: Congestion present.     Mouth/Throat:     Mouth: Mucous membranes are moist.     Pharynx: Posterior oropharyngeal erythema present.  Eyes:     Extraocular Movements: Extraocular movements intact.     Conjunctiva/sclera: Conjunctivae normal.     Pupils: Pupils are equal, round, and reactive to light.  Cardiovascular:     Rate and Rhythm: Normal rate and regular rhythm.     Pulses: Normal pulses.     Heart sounds: Normal heart sounds.  Pulmonary:      Effort: Pulmonary effort is normal. No respiratory distress.     Breath sounds: Normal breath sounds. No wheezing.  Abdominal:     General: Abdomen is flat. Bowel sounds are normal.     Palpations: Abdomen is soft.  Musculoskeletal:        General: Normal range of motion.     Cervical back: Normal range of motion.  Skin:    General: Skin is warm and dry.  Neurological:     General: No focal deficit present.     Mental Status: She is alert and oriented to person, place, and time. Mental status is at baseline.  Psychiatric:        Mood and Affect: Mood normal.        Behavior: Behavior normal.      UC Treatments / Results  Labs (all labs ordered are listed, but only abnormal results are displayed) Labs Reviewed  CULTURE, GROUP A STREP ([facility])  SARS CORONAVIRUS 2 (TAT 6-24 HRS)  POCT RAPID STREP A (OFFICE)    EKG   Radiology No results found.  Procedures Procedures (including critical care time)  Medications Ordered in UC Medications  ketorolac (TORADOL) injection 15 mg (30 mg Intramuscular Given 03/19/23 1441)  ketorolac (TORADOL) injection 15 mg (30 mg Intramuscular Given 03/19/23 1442)    Initial Impression / Assessment and Plan / UC Course  I have reviewed the triage vital signs and the nursing notes.  Pertinent labs & imaging results that were available during my care of the patient were reviewed by me and considered in my medical decision making (see chart for details).     Suspect viral cause of symptoms. Strep is negative.  Throat culture pending.  Patient requesting COVID testing but I do have a low suspicion for this given that she just tested positive for COVID in April.  Although, COVID test is pending.  Advised supportive care and symptom management.  Will treat with IM Toradol today in urgent care to help alleviate headache and sore throat.  Advise no NSAIDs for at least 24 hours following injection.  Advised adequate fluid hydration and symptom  management.  Advised strict return precautions.  Patient verbalized understanding and was agreeable with plan. Final Clinical Impressions(s) / UC Diagnoses   Final diagnoses:  Viral illness  Sore throat     Discharge Instructions      Suspect viral cause to your symptoms as we discussed.  Rapid strep is negative.  Throat culture and COVID test pending.  Will call if it is abnormal.  You were given a shot today in urgent care for pain.  Do not take any ibuprofen, Advil, Aleve for at least 24 hours following injection.    ED Prescriptions   None    PDMP not reviewed this encounter.   Coleman, Ames  E, FNP 03/19/23 1511

## 2023-03-19 NOTE — Discharge Instructions (Signed)
Suspect viral cause to your symptoms as we discussed.  Rapid strep is negative.  Throat culture and COVID test pending.  Will call if it is abnormal.  You were given a shot today in urgent care for pain.  Do not take any ibuprofen, Advil, Aleve for at least 24 hours following injection.

## 2023-03-19 NOTE — ED Notes (Signed)
Patient given Ketorolac 15mg  x 2 given in RUQ for a total of 30mg .

## 2023-03-19 NOTE — ED Triage Notes (Signed)
Patient c/o severe sore throat, headache and congestion since yesterday.  Patient concern for either strep or COVID.  Denies any OTC cold/pain meds.

## 2023-03-20 DIAGNOSIS — Z419 Encounter for procedure for purposes other than remedying health state, unspecified: Secondary | ICD-10-CM | POA: Diagnosis not present

## 2023-03-20 LAB — SARS CORONAVIRUS 2 (TAT 6-24 HRS): SARS Coronavirus 2: NEGATIVE

## 2023-03-21 LAB — CULTURE, GROUP A STREP (THRC)

## 2023-03-22 LAB — CULTURE, GROUP A STREP (THRC)

## 2023-03-23 NOTE — Progress Notes (Unsigned)
   LMP 04/08/2021    Subjective:    [name], female    DOB: 1996-10-17, 27 y.o.   MRN: [id number]  HPI: Janet Rich is a 27 y.o. female  No chief complaint on file.   Relevant past medical, surgical, family and social history reviewed and updated as indicated. Interim medical history since our last visit reviewed. Allergies and medications reviewed and updated.  Review of Systems  Constitutional: Negative for fever or weight change.  Respiratory: Negative for cough and shortness of breath.   Cardiovascular: Negative for chest pain or palpitations.  Gastrointestinal: Negative for abdominal pain, no bowel changes.  Musculoskeletal: Negative for gait problem or joint swelling.  Skin: Negative for rash.  Neurological: Negative for dizziness or headache.  No other specific complaints in a complete review of systems (except as listed in HPI above).      Objective:    LMP 04/08/2021   Wt Readings from Last 3 Encounters:  03/19/23 210 lb (95.3 kg)  01/19/23 200 lb (90.7 kg)  01/08/23 200 lb 12.8 oz (91.1 kg)    Physical Exam  Constitutional: Patient appears well-developed and well-nourished. Obese *** No distress.  HEENT: head atraumatic, normocephalic, pupils equal and reactive to light, ears ***, neck supple, throat within normal limits Cardiovascular: Normal rate, regular rhythm and normal heart sounds.  No murmur heard. No BLE edema. Pulmonary/Chest: Effort normal and breath sounds normal. No respiratory distress. Abdominal: Soft.  There is no tenderness. Psychiatric: Patient has a normal mood and affect. behavior is normal. Judgment and thought content normal.      Assessment & Plan:   Problem List Items Addressed This Visit   None    Follow up plan: No follow-ups on file.

## 2023-03-24 ENCOUNTER — Ambulatory Visit
Admission: RE | Admit: 2023-03-24 | Discharge: 2023-03-24 | Disposition: A | Discharge status: Home / Self Care | Payer: Medicaid Other | Source: Ambulatory Visit | Attending: Nurse Practitioner | Admitting: Nurse Practitioner

## 2023-03-24 ENCOUNTER — Encounter: Payer: Self-pay | Admitting: Nurse Practitioner

## 2023-03-24 ENCOUNTER — Other Ambulatory Visit: Payer: Self-pay | Admitting: Nurse Practitioner

## 2023-03-24 ENCOUNTER — Ambulatory Visit
Admission: RE | Admit: 2023-03-24 | Discharge: 2023-03-24 | Disposition: A | Discharge status: Home / Self Care | Payer: Medicaid Other | Attending: Nurse Practitioner | Admitting: Nurse Practitioner

## 2023-03-24 ENCOUNTER — Ambulatory Visit (INDEPENDENT_AMBULATORY_CARE_PROVIDER_SITE_OTHER): Payer: Medicaid Other | Admitting: Nurse Practitioner

## 2023-03-24 VITALS — BP 122/84 | HR 109 | Temp 98.0°F | Resp 18 | Ht 64.0 in | Wt 203.3 lb

## 2023-03-24 DIAGNOSIS — J029 Acute pharyngitis, unspecified: Secondary | ICD-10-CM | POA: Diagnosis not present

## 2023-03-24 DIAGNOSIS — F330 Major depressive disorder, recurrent, mild: Secondary | ICD-10-CM | POA: Diagnosis not present

## 2023-03-24 DIAGNOSIS — R042 Hemoptysis: Secondary | ICD-10-CM

## 2023-03-24 DIAGNOSIS — F411 Generalized anxiety disorder: Secondary | ICD-10-CM | POA: Diagnosis not present

## 2023-03-24 DIAGNOSIS — J0140 Acute pansinusitis, unspecified: Secondary | ICD-10-CM

## 2023-03-24 LAB — CBC WITH DIFFERENTIAL/PLATELET
Basophils Absolute: 21 cells/uL (ref 0–200)
Basophils Relative: 0.2 %
Eosinophils Absolute: 307 cells/uL (ref 15–500)
MPV: 9.6 fL (ref 7.5–12.5)
Monocytes Relative: 5.3 %
Platelets: 365 10*3/uL (ref 140–400)

## 2023-03-24 LAB — POCT RAPID STREP A (OFFICE): Rapid Strep A Screen: NEGATIVE

## 2023-03-24 MED ORDER — DOXYCYCLINE HYCLATE 100 MG PO TABS
100.0000 mg | ORAL_TABLET | Freq: Two times a day (BID) | ORAL | 0 refills | Status: AC
Start: 2023-03-24 — End: 2023-04-03

## 2023-03-25 LAB — CBC WITH DIFFERENTIAL/PLATELET
Eosinophils Relative: 2.9 %
HCT: 41.7 % (ref 35.0–45.0)
MCH: 29.5 pg (ref 27.0–33.0)
MCV: 88.5 fL (ref 80.0–100.0)
Neutro Abs: 6731 cells/uL (ref 1500–7800)
Neutrophils Relative %: 63.5 %
RBC: 4.71 10*6/uL (ref 3.80–5.10)
RDW: 12.5 % (ref 11.0–15.0)
Total Lymphocyte: 28.1 %
WBC: 10.6 10*3/uL (ref 3.8–10.8)

## 2023-03-25 LAB — COMPLETE METABOLIC PANEL WITH GFR
ALT: 13 U/L (ref 6–29)
BUN: 9 mg/dL (ref 7–25)
CO2: 23 mmol/L (ref 20–32)
Calcium: 9.4 mg/dL (ref 8.6–10.2)
Glucose, Bld: 97 mg/dL (ref 65–99)
Potassium: 4.4 mmol/L (ref 3.5–5.3)
Sodium: 138 mmol/L (ref 135–146)

## 2023-03-27 LAB — CBC WITH DIFFERENTIAL/PLATELET
Absolute Monocytes: 562 cells/uL (ref 200–950)
Hemoglobin: 13.9 g/dL (ref 11.7–15.5)
Lymphs Abs: 2979 cells/uL (ref 850–3900)
MCHC: 33.3 g/dL (ref 32.0–36.0)

## 2023-03-27 LAB — COMPLETE METABOLIC PANEL WITH GFR
AG Ratio: 1.3 (calc) (ref 1.0–2.5)
AST: 13 U/L (ref 10–30)
Albumin: 4 g/dL (ref 3.6–5.1)
Alkaline phosphatase (APISO): 81 U/L (ref 31–125)
Chloride: 102 mmol/L (ref 98–110)
Creat: 0.59 mg/dL (ref 0.50–0.96)
Globulin: 3.1 g/dL (calc) (ref 1.9–3.7)
Total Bilirubin: 0.2 mg/dL (ref 0.2–1.2)
Total Protein: 7.1 g/dL (ref 6.1–8.1)
eGFR: 126 mL/min/{1.73_m2} (ref >=60)

## 2023-03-27 LAB — MONONUCLEOSIS SCREEN: Heterophile, Mono Screen: NEGATIVE

## 2023-03-27 LAB — QUANTIFERON-TB GOLD PLUS
Mitogen-NIL: 7.25 IU/mL
NIL: 0.02 IU/mL
QuantiFERON-TB Gold Plus: NEGATIVE
TB1-NIL: 0 IU/mL
TB2-NIL: 0.01 IU/mL

## 2023-04-14 DIAGNOSIS — F411 Generalized anxiety disorder: Secondary | ICD-10-CM | POA: Diagnosis not present

## 2023-04-14 DIAGNOSIS — F330 Major depressive disorder, recurrent, mild: Secondary | ICD-10-CM | POA: Diagnosis not present

## 2023-04-19 DIAGNOSIS — Z419 Encounter for procedure for purposes other than remedying health state, unspecified: Secondary | ICD-10-CM | POA: Diagnosis not present

## 2023-04-20 ENCOUNTER — Ambulatory Visit: Payer: Medicaid Other | Admitting: Registered"

## 2023-04-26 DIAGNOSIS — M5386 Other specified dorsopathies, lumbar region: Secondary | ICD-10-CM | POA: Diagnosis not present

## 2023-04-26 DIAGNOSIS — M9904 Segmental and somatic dysfunction of sacral region: Secondary | ICD-10-CM | POA: Diagnosis not present

## 2023-04-26 DIAGNOSIS — M9905 Segmental and somatic dysfunction of pelvic region: Secondary | ICD-10-CM | POA: Diagnosis not present

## 2023-04-26 DIAGNOSIS — M9903 Segmental and somatic dysfunction of lumbar region: Secondary | ICD-10-CM | POA: Diagnosis not present

## 2023-04-29 DIAGNOSIS — M9903 Segmental and somatic dysfunction of lumbar region: Secondary | ICD-10-CM | POA: Diagnosis not present

## 2023-04-29 DIAGNOSIS — M9904 Segmental and somatic dysfunction of sacral region: Secondary | ICD-10-CM | POA: Diagnosis not present

## 2023-04-29 DIAGNOSIS — M5386 Other specified dorsopathies, lumbar region: Secondary | ICD-10-CM | POA: Diagnosis not present

## 2023-04-29 DIAGNOSIS — M9905 Segmental and somatic dysfunction of pelvic region: Secondary | ICD-10-CM | POA: Diagnosis not present

## 2023-05-03 DIAGNOSIS — F3181 Bipolar II disorder: Secondary | ICD-10-CM | POA: Diagnosis not present

## 2023-05-03 DIAGNOSIS — F900 Attention-deficit hyperactivity disorder, predominantly inattentive type: Secondary | ICD-10-CM | POA: Diagnosis not present

## 2023-05-05 DIAGNOSIS — F330 Major depressive disorder, recurrent, mild: Secondary | ICD-10-CM | POA: Diagnosis not present

## 2023-05-05 DIAGNOSIS — F411 Generalized anxiety disorder: Secondary | ICD-10-CM | POA: Diagnosis not present

## 2023-05-06 ENCOUNTER — Other Ambulatory Visit: Payer: Self-pay

## 2023-05-06 DIAGNOSIS — M5386 Other specified dorsopathies, lumbar region: Secondary | ICD-10-CM | POA: Diagnosis not present

## 2023-05-06 DIAGNOSIS — M9904 Segmental and somatic dysfunction of sacral region: Secondary | ICD-10-CM | POA: Diagnosis not present

## 2023-05-06 DIAGNOSIS — M9903 Segmental and somatic dysfunction of lumbar region: Secondary | ICD-10-CM | POA: Diagnosis not present

## 2023-05-06 DIAGNOSIS — M9905 Segmental and somatic dysfunction of pelvic region: Secondary | ICD-10-CM | POA: Diagnosis not present

## 2023-05-10 DIAGNOSIS — M5386 Other specified dorsopathies, lumbar region: Secondary | ICD-10-CM | POA: Diagnosis not present

## 2023-05-10 DIAGNOSIS — M9904 Segmental and somatic dysfunction of sacral region: Secondary | ICD-10-CM | POA: Diagnosis not present

## 2023-05-10 DIAGNOSIS — M9903 Segmental and somatic dysfunction of lumbar region: Secondary | ICD-10-CM | POA: Diagnosis not present

## 2023-05-10 DIAGNOSIS — M9905 Segmental and somatic dysfunction of pelvic region: Secondary | ICD-10-CM | POA: Diagnosis not present

## 2023-05-11 ENCOUNTER — Other Ambulatory Visit: Payer: Self-pay

## 2023-05-11 ENCOUNTER — Telehealth: Payer: Self-pay | Admitting: Gastroenterology

## 2023-05-11 ENCOUNTER — Encounter: Payer: Self-pay | Admitting: Gastroenterology

## 2023-05-11 ENCOUNTER — Ambulatory Visit (INDEPENDENT_AMBULATORY_CARE_PROVIDER_SITE_OTHER): Payer: Medicaid Other | Admitting: Gastroenterology

## 2023-05-11 VITALS — BP 131/87 | HR 81 | Temp 97.6°F | Ht 64.0 in | Wt 203.0 lb

## 2023-05-11 DIAGNOSIS — K219 Gastro-esophageal reflux disease without esophagitis: Secondary | ICD-10-CM

## 2023-05-11 MED ORDER — OMEPRAZOLE 40 MG PO CPDR
40.0000 mg | DELAYED_RELEASE_CAPSULE | Freq: Two times a day (BID) | ORAL | 1 refills | Status: DC
Start: 2023-05-11 — End: 2024-01-10

## 2023-05-11 NOTE — Telephone Encounter (Signed)
Sent kim in [place] a secure chat to move patient to 06/10/2023. Called patient and left a detail message informing patient we will get her moved to then and sent her new instructions with this date on it

## 2023-05-11 NOTE — Progress Notes (Signed)
Arlyss Repress, MD [address]  [address]  [place], [place] [address]  Main: [phone]  Fax: [phone]    Gastroenterology Consultation  Referring Provider:     Berniece Salines, FNP Primary Care Physician:  Berniece Salines, FNP Primary Gastroenterologist:  Dr. Arlyss Repress Reason for Consultation: Chronic GERD        HPI:   Janet Rich is a 27 y.o. female referred by Berniece Salines, FNP  for consultation & management of chronic GERD.  Patient reports that she has been experiencing sensation of food stuck in her throat, reflux, heartburn as well as regurgitation about an hour after a meal.  The symptoms have been ongoing ever since she was a teenager and attributes her symptoms mostly to stress, anxiety for which she is on several antipsychiatric medications.  She is currently on low-dose omeprazole 20 mg daily started by her PCP.  She reports not trying high-dose PPI so far.  She also reports intermittent episodes of nausea and vomiting that occur about twice a month during episodes of anxiety and stress, relieved by taking Phenergan prescribed by her PCP.  She denies any other GI symptoms.  She has children and also going to school  NSAIDs: None  Antiplts/Anticoagulants/Anti thrombotics: None  GI Procedures: None  Past Medical History:  Diagnosis Date   Anemia    with periods   Ankle fracture    right   Anxiety    Asthma    Back pain    Bipolar 1 disorder (HCC)    Depression    Bipolar    Endometriosis    Fibromyalgia    GERD (gastroesophageal reflux disease)    Headache    MIGRAINES   Hyperlipidemia    IBS (irritable bowel syndrome)    Ovarian cyst    PCOS (polycystic ovarian syndrome)    Pneumonia    age 83   PONV (postoperative nausea and vomiting)    nausea after wisdom teeth extraction   Scoliosis    Vaginal Pap smear, abnormal    bx ok    Past Surgical History:  Procedure Laterality Date   ABDOMINAL HYSTERECTOMY     FRACTURE SURGERY      ankle surgery with impant    HYSTEROSCOPY WITH D & C N/A 09/22/2019   Procedure: DILATATION AND CURETTAGE /HYSTEROSCOPY;  Surgeon: Ward, Elenora Fender, MD;  Location: [facility];  Service: Gynecology;  Laterality: N/A;   LYSIS OF ADHESION N/A 05/08/2021   Procedure: LYSIS OF ADHESION, EXCISION OF ENDOMETRIOSIS;  Surgeon: Christeen Douglas, MD;  Location: [facility];  Service: Gynecology;  Laterality: N/A;   ROBOTIC ASSISTED DIAGNOSTIC LAPAROSCOPY N/A 02/18/2017   Procedure: ROBOTIC ASSISTED DIAGNOSTIC LAPAROSCOPY,EXCISION AND ABLATION OF ENDOMETRIOSIS,LYSIS OF ADHESIONS;  Surgeon: Olivia Mackie, MD;  Location: [facility];  Service: Gynecology;  Laterality: N/A;   ROBOTIC ASSISTED LAPAROSCOPIC HYSTERECTOMY AND SALPINGECTOMY Bilateral 05/08/2021   Procedure: XI ROBOTIC ASSISTED TOTAL LAPAROSCOPIC HYSTERECTOMY AND SALPINGECTOMY;  Surgeon: Christeen Douglas, MD;  Location: [facility];  Service: Gynecology;  Laterality: Bilateral;   VAGINAL DELIVERY N/A 08/14/2020   Procedure: VAGINAL DELIVERY;  Surgeon: Feliberto Gottron Ihor Austin, MD;  Location: [facility];  Service: Obstetrics;  Laterality: N/A;   WISDOM TOOTH EXTRACTION  2013     Current Outpatient Medications:    acetaminophen (TYLENOL) 500 MG tablet, Take 2 tablets (1,000 mg total) by mouth every 6 (six) hours as needed for moderate pain., Disp: 30 tablet, Rfl: 0   albuterol (PROVENTIL) (2.5  MG/3ML) 0.083% nebulizer solution, Take 3 mLs (2.5 mg total) by nebulization every 6 (six) hours as needed for wheezing or shortness of breath., Disp: 75 mL, Rfl: 12   albuterol (VENTOLIN HFA) 108 (90 Base) MCG/ACT inhaler, Inhale 1-2 puffs into the lungs every 6 (six) hours as needed for wheezing or shortness of breath., Disp: 1 each, Rfl: 3   ALPRAZolam (XANAX) 0.25 MG tablet, Take 0.25 mg by mouth at bedtime as needed for anxiety., Disp: , Rfl:    Ascorbic Acid (VITAMIN C) 100 MG tablet, Take 100 mg by mouth daily., Disp: , Rfl:    budesonide-formoterol (SYMBICORT)  160-4.5 MCG/ACT inhaler, Inhale 2 puffs into the lungs 2 (two) times daily., Disp: 1 each, Rfl: 3   cholecalciferol (VITAMIN D3) 25 MCG (1000 UNIT) tablet, Take 1,000 Units by mouth daily., Disp: , Rfl:    famotidine (PEPCID) 20 MG tablet, Take 1 tablet (20 mg total) by mouth 2 (two) times daily., Disp: 180 tablet, Rfl: 1   gabapentin (NEURONTIN) 100 MG capsule, Take 400 mg by mouth at bedtime., Disp: , Rfl:    ibuprofen (ADVIL) 800 MG tablet, Take 1 tablet (800 mg total) by mouth every 8 (eight) hours as needed., Disp: 30 tablet, Rfl: 2   lamoTRIgine (LAMICTAL) 200 MG tablet, Take 200 mg by mouth at bedtime., Disp: , Rfl:    Lurasidone HCl 60 MG TABS, Take 60 mg by mouth every evening., Disp: , Rfl:    MAGNESIUM MALATE PO, Take 1 tablet by mouth daily at 6 (six) AM., Disp: , Rfl:    Multiple Vitamin (MULTIVITAMIN ADULT PO), Take 1 tablet by mouth daily., Disp: , Rfl:    omeprazole (PRILOSEC) 20 MG capsule, Take 1 capsule (20 mg total) by mouth daily., Disp: 90 capsule, Rfl: 1   promethazine (PHENERGAN) 25 MG tablet, Take 1 tablet (25 mg total) by mouth every 6 (six) hours as needed for nausea or vomiting., Disp: 12 tablet, Rfl: 0   propranolol (INDERAL) 40 MG tablet, Take 40 mg by mouth., Disp: , Rfl:    QUEtiapine (SEROQUEL) 25 MG tablet, Take 25 mg by mouth at bedtime., Disp: , Rfl:    rizatriptan (MAXALT) 10 MG tablet, Take 1 tablet (10 mg total) by mouth as needed for migraine. May repeat in 2 hours if needed, Disp: 10 tablet, Rfl: 5   topiramate (TOPAMAX) 25 MG tablet, Take 1 tablet at bedtime x 1 week, then take 2 tablets at bedtime, Disp: 60 tablet, Rfl: 5   vitamin B-12 (CYANOCOBALAMIN) 500 MCG tablet, Take 500 mcg by mouth daily., Disp: , Rfl:    Family History  Problem Relation Age of Onset   Endometriosis Mother    Bipolar disorder Mother    Diabetes Mother        prediabetes   Schizophrenia Father    Depression Father    Breast cancer Paternal Grandmother    Cancer Paternal  Grandmother    Depression Paternal Grandmother    Hypertension Other    Hyperlipidemia Other    Asthma Other    Heart disease Neg Hx    Colon cancer Neg Hx    Ovarian cancer Neg Hx      Social History   Tobacco Use   Smoking status: Former    Current packs/day: 0.00    Types: Cigarettes    Start date: 12/20/2008    Quit date: 12/20/2009    Years since quitting: 13.3   Smokeless tobacco: Never   Tobacco comments:  age [age], quit, 1 cig daily  Vaping Use   Vaping status: Never Used  Substance Use Topics   Alcohol use: Not Currently   Drug use: No    Allergies as of 05/11/2023 - Review Complete 05/11/2023  Allergen Reaction Noted   Amoxicillin Shortness Of Breath and Nausea Only 11/13/2019   Other  02/10/2017   Latex Rash and Other (See Comments) 05/01/2015    Review of Systems:    All systems reviewed and negative except where noted in HPI.   Physical Exam:  BP 131/87 (BP Location: Left Arm, Patient Position: Sitting, Cuff Size: Large)   Pulse 81   Temp 97.6 F (36.4 C) (Oral)   Ht 5\' 4"  (1.626 m)   Wt 203 lb (92.1 kg)   LMP 04/08/2021   BMI 34.84 kg/m  Patient's last menstrual period was 04/08/2021.  General:   Alert,  Well-developed, well-nourished, pleasant and cooperative in NAD Head:  Normocephalic and atraumatic. Eyes:  Sclera clear, no icterus.   Conjunctiva pink. Ears:  Normal auditory acuity. Nose:  No deformity, discharge, or lesions. Mouth:  No deformity or lesions,oropharynx pink & moist. Neck:  Supple; no masses or thyromegaly. Lungs:  Respirations even and unlabored.  Clear throughout to auscultation.   No wheezes, crackles, or rhonchi. No acute distress. Heart:  Regular rate and rhythm; no murmurs, clicks, rubs, or gallops. Abdomen:  Normal bowel sounds. Soft, non-tender and non-distended without masses, hepatosplenomegaly or hernias noted.  No guarding or rebound tenderness.   Rectal: Not performed Msk:  Symmetrical without gross deformities.  Good, equal movement & strength bilaterally. Pulses:  Normal pulses noted. Extremities:  No clubbing or edema.  No cyanosis. Neurologic:  Alert and oriented x3;  grossly normal neurologically. Skin:  Intact without significant lesions or rashes. No jaundice. Psych:  Alert and cooperative. Normal mood and affect.  Imaging Studies: No abdominal imaging  Assessment and Plan:   Janet Rich is a 27 y.o. female with history of anxiety, bipolar, depression is seen in consultation for symptoms of chronic GERD currently maintained on low-dose PPI  Chronic GERD Increase Prilosec to 40 mg p.o. twice daily before meals, prescription sent Discussed about antireflux lifestyle Recommend EGD for further evaluation given duration of symptoms   Follow up based on the above workup   Arlyss Repress, MD

## 2023-05-11 NOTE — Telephone Encounter (Signed)
Patient called in and she would like to change her appointment to Aug 22, 24. She really wanted to keep this appointment, but she is unable to find someone to bring her.

## 2023-05-20 DIAGNOSIS — Z419 Encounter for procedure for purposes other than remedying health state, unspecified: Secondary | ICD-10-CM | POA: Diagnosis not present

## 2023-05-26 DIAGNOSIS — F330 Major depressive disorder, recurrent, mild: Secondary | ICD-10-CM | POA: Diagnosis not present

## 2023-05-26 DIAGNOSIS — F411 Generalized anxiety disorder: Secondary | ICD-10-CM | POA: Diagnosis not present

## 2023-05-27 ENCOUNTER — Other Ambulatory Visit: Payer: Self-pay | Admitting: Gastroenterology

## 2023-05-27 DIAGNOSIS — K219 Gastro-esophageal reflux disease without esophagitis: Secondary | ICD-10-CM

## 2023-06-03 ENCOUNTER — Encounter: Payer: Self-pay | Admitting: Anesthesiology

## 2023-06-03 NOTE — Anesthesia Preprocedure Evaluation (Signed)
Anesthesia Evaluation    Airway        Dental   Pulmonary former smoker          Cardiovascular      Neuro/Psych    GI/Hepatic   Endo/Other    Renal/GU      Musculoskeletal   Abdominal   Peds  Hematology   Anesthesia Other Findings Asthma  Anxiety Back pain  Endometriosis Headache  Ankle fracture Ovarian cyst  PCOS (polycystic ovarian syndrome) Hx Pneumonia GERD (gastroesophageal reflux disease) Anemia PONV (postoperative nausea and vomiting)  Depression IBS (irritable bowel syndrome) Scoliosis Bipolar 1 disorder (HCC)  Fibromyalgia Hyperlipidemia Postop nausea and vomiting     Reproductive/Obstetrics                             Anesthesia Physical Anesthesia Plan Anesthesia Quick Evaluation

## 2023-06-10 ENCOUNTER — Encounter: Admission: RE | Payer: Self-pay | Source: Home / Self Care

## 2023-06-10 ENCOUNTER — Ambulatory Visit: Admission: RE | Admit: 2023-06-10 | Payer: Medicaid Other | Source: Home / Self Care | Admitting: Gastroenterology

## 2023-06-10 SURGERY — ESOPHAGOGASTRODUODENOSCOPY (EGD) WITH PROPOFOL
Anesthesia: Choice

## 2023-06-16 DIAGNOSIS — F411 Generalized anxiety disorder: Secondary | ICD-10-CM | POA: Diagnosis not present

## 2023-06-16 DIAGNOSIS — F330 Major depressive disorder, recurrent, mild: Secondary | ICD-10-CM | POA: Diagnosis not present

## 2023-06-19 ENCOUNTER — Ambulatory Visit
Admission: RE | Admit: 2023-06-19 | Discharge: 2023-06-19 | Disposition: A | Discharge status: Home / Self Care | Payer: Medicaid Other | Source: Ambulatory Visit | Attending: Physician Assistant | Admitting: Physician Assistant

## 2023-06-19 ENCOUNTER — Other Ambulatory Visit: Payer: Self-pay

## 2023-06-19 VITALS — BP 115/72 | HR 87 | Temp 98.8°F | Resp 18

## 2023-06-19 DIAGNOSIS — N760 Acute vaginitis: Secondary | ICD-10-CM | POA: Diagnosis not present

## 2023-06-19 MED ORDER — FLUCONAZOLE 150 MG PO TABS
ORAL_TABLET | ORAL | 0 refills | Status: DC
Start: 2023-06-19 — End: 2023-07-12

## 2023-06-19 NOTE — ED Triage Notes (Signed)
Pt here for vaginal discharge that she thinks is a yeast infection; pt sts used otc meds without relief

## 2023-06-19 NOTE — ED Provider Notes (Signed)
[facility]    CSN: [id number] Arrival date & time: 06/19/23  1305      History   Chief Complaint Chief Complaint  Patient presents with   Vaginal Itching    Yeast infection that didn't clear up with OTC medication and didn't resolve within 7 days. - Entered by patient    HPI Janet Rich is a 27 y.o. female.   Patient here today for evaluation of vaginal itching that started over a week ago.  She reports that she tried to use the Monistat 3-day treatment without resolution of symptoms.  She denies significant vaginal discharge.  She does not report any exposures to STDs and has no concerns for same.  She denies any genital lesions or rashes.   Vaginal Itching Pertinent negatives include no abdominal pain.    Past Medical History:  Diagnosis Date   Anemia    with periods   Ankle fracture    right   Anxiety    Asthma    Back pain    Bipolar 1 disorder (HCC)    Depression    Bipolar    Endometriosis    Fibromyalgia    GERD (gastroesophageal reflux disease)    Headache    MIGRAINES   Hyperlipidemia    IBS (irritable bowel syndrome)    Ovarian cyst    PCOS (polycystic ovarian syndrome)    Pneumonia    age 31   PONV (postoperative nausea and vomiting)    nausea after wisdom teeth extraction   Scoliosis    Vaginal Pap smear, abnormal    bx ok    Patient Active Problem List   Diagnosis Date Noted   Attention deficit hyperactivity disorder (ADHD), combined type 01/08/2023   Chronic migraine w/o aura w/o status migrainosus, not intractable 07/30/2022   Bulging of lumbar intervertebral disc 06/16/2022   Fibromyalgia 03/09/2022   Chronic back pain 03/09/2022   Excessive daytime sleepiness 11/28/2021   Endometriosis determined by laparoscopy 05/08/2021   Myalgia 03/11/2021   Elevated BP without diagnosis of hypertension 01/08/2021   Dichorionic diamniotic twin pregnancy in third trimester 08/14/2020   Preeclampsia 08/14/2020   Twin pregnancy  delivered vaginally 08/14/2020   Preterm labor 06/17/2020   Right trigeminal neuralgia 11/08/2019   Endometriosis 03/27/2019   PCOS (polycystic ovarian syndrome) 03/27/2019   Moderate persistent asthma 03/07/2019   Post-nasal drainage 03/07/2019   Irritable bowel syndrome with diarrhea 03/07/2019   Gastroesophageal reflux disease without esophagitis 03/07/2019   Class 1 obesity due to excess calories with serious comorbidity and body mass index (BMI) of 34.0 to 34.9 in adult 09/02/2018   Bipolar 2 disorder (HCC) 08/12/2018   History of low birth weight 03/11/2018   Vulvar atrophy 03/11/2018   History of migraine headaches 05/01/2015   Anxiety and depression 05/01/2015    Past Surgical History:  Procedure Laterality Date   ABDOMINAL HYSTERECTOMY     FRACTURE SURGERY     ankle surgery with impant    HYSTEROSCOPY WITH D & C N/A 09/22/2019   Procedure: DILATATION AND CURETTAGE /HYSTEROSCOPY;  Surgeon: Ward, Elenora Fender, MD;  Location: [facility];  Service: Gynecology;  Laterality: N/A;   LYSIS OF ADHESION N/A 05/08/2021   Procedure: LYSIS OF ADHESION, EXCISION OF ENDOMETRIOSIS;  Surgeon: Christeen Douglas, MD;  Location: [facility];  Service: Gynecology;  Laterality: N/A;   ROBOTIC ASSISTED DIAGNOSTIC LAPAROSCOPY N/A 02/18/2017   Procedure: ROBOTIC ASSISTED DIAGNOSTIC LAPAROSCOPY,EXCISION AND ABLATION OF ENDOMETRIOSIS,LYSIS OF ADHESIONS;  Surgeon: Olivia Mackie, MD;  Location: [facility];  Service: Gynecology;  Laterality: N/A;   ROBOTIC ASSISTED LAPAROSCOPIC HYSTERECTOMY AND SALPINGECTOMY Bilateral 05/08/2021   Procedure: XI ROBOTIC ASSISTED TOTAL LAPAROSCOPIC HYSTERECTOMY AND SALPINGECTOMY;  Surgeon: Christeen Douglas, MD;  Location: [facility];  Service: Gynecology;  Laterality: Bilateral;   VAGINAL DELIVERY N/A 08/14/2020   Procedure: VAGINAL DELIVERY;  Surgeon: Feliberto Gottron Ihor Austin, MD;  Location: [facility];  Service: Obstetrics;  Laterality: N/A;   WISDOM TOOTH EXTRACTION  2013    OB History      Gravida  2   Para  2   Term  1   Preterm  1   AB  0   Living  3      SAB  0   IAB  0   Ectopic  0   Multiple  1   Live Births  3            Home Medications    Prior to Admission medications   Medication Sig Start Date End Date Taking? Authorizing Provider  fluconazole (DIFLUCAN) 150 MG tablet Take one tab PO today and repeat dose in 3 days if symptoms persist 06/19/23  Yes Tomi Bamberger, PA-C  acetaminophen (TYLENOL) 500 MG tablet Take 2 tablets (1,000 mg total) by mouth every 6 (six) hours as needed for moderate pain. 07/11/20   McVey, Prudencio Pair, CNM  albuterol (PROVENTIL) (2.5 MG/3ML) 0.083% nebulizer solution Take 3 mLs (2.5 mg total) by nebulization every 6 (six) hours as needed for wheezing or shortness of breath. 01/08/23   Berniece Salines, FNP  albuterol (VENTOLIN HFA) 108 (90 Base) MCG/ACT inhaler Inhale 1-2 puffs into the lungs every 6 (six) hours as needed for wheezing or shortness of breath. 01/08/23   Berniece Salines, FNP  ALPRAZolam Prudy Feeler) 0.25 MG tablet Take 0.25 mg by mouth at bedtime as needed for anxiety.    [provider]  Ascorbic Acid (VITAMIN C) 100 MG tablet Take 100 mg by mouth daily.    [provider]  budesonide-formoterol (SYMBICORT) 160-4.5 MCG/ACT inhaler Inhale 2 puffs into the lungs 2 (two) times daily. 01/08/23   Berniece Salines, FNP  cholecalciferol (VITAMIN D3) 25 MCG (1000 UNIT) tablet Take 1,000 Units by mouth daily.    [provider]  famotidine (PEPCID) 20 MG tablet Take 1 tablet (20 mg total) by mouth 2 (two) times daily. 01/08/23   Berniece Salines, FNP  gabapentin (NEURONTIN) 100 MG capsule Take 400 mg by mouth at bedtime. 02/22/22   [provider]  ibuprofen (ADVIL) 800 MG tablet Take 1 tablet (800 mg total) by mouth every 8 (eight) hours as needed. 11/10/21   Berniece Salines, FNP  lamoTRIgine (LAMICTAL) 200 MG tablet Take 200 mg by mouth at bedtime. 11/22/21   [provider]   Lurasidone HCl 60 MG TABS Take 60 mg by mouth every evening. 03/24/23   [provider]  MAGNESIUM MALATE PO Take 1 tablet by mouth daily at 6 (six) AM.    [provider]  Multiple Vitamin (MULTIVITAMIN ADULT PO) Take 1 tablet by mouth daily.    [provider]  omeprazole (PRILOSEC) 40 MG capsule Take 1 capsule (40 mg total) by mouth 2 (two) times daily before a meal. 05/11/23 07/10/23  Vanga, Loel Dubonnet, MD  promethazine (PHENERGAN) 25 MG tablet Take 1 tablet (25 mg total) by mouth every 6 (six) hours as needed for nausea or vomiting. 01/08/23   Berniece Salines, FNP  propranolol Bevely Palmer)  40 MG tablet Take 40 mg by mouth. 03/24/23   [provider]  QUEtiapine (SEROQUEL) 25 MG tablet Take 25 mg by mouth at bedtime.    [provider]  rizatriptan (MAXALT) 10 MG tablet Take 1 tablet (10 mg total) by mouth as needed for migraine. May repeat in 2 hours if needed 01/19/23   Glean Salvo, NP  topiramate (TOPAMAX) 25 MG tablet Take 1 tablet at bedtime x 1 week, then take 2 tablets at bedtime 01/19/23   Glean Salvo, NP  vitamin B-12 (CYANOCOBALAMIN) 500 MCG tablet Take 500 mcg by mouth daily.    [provider]    Family History Family History  Problem Relation Age of Onset   Endometriosis Mother    Bipolar disorder Mother    Diabetes Mother        prediabetes   Schizophrenia Father    Depression Father    Breast cancer Paternal Grandmother    Cancer Paternal Grandmother    Depression Paternal Grandmother    Hypertension Other    Hyperlipidemia Other    Asthma Other    Heart disease Neg Hx    Colon cancer Neg Hx    Ovarian cancer Neg Hx     Social History Social History   Tobacco Use   Smoking status: Former    Current packs/day: 0.00    Types: Cigarettes    Start date: 12/20/2008    Quit date: 12/20/2009    Years since quitting: 13.5   Smokeless tobacco: Never   Tobacco comments:    age [age], quit, 1 cig daily  Vaping Use    Vaping status: Never Used  Substance Use Topics   Alcohol use: Not Currently   Drug use: No     Allergies   Amoxicillin, Other, and Latex   Review of Systems Review of Systems  Constitutional:  Negative for chills and fever.  Eyes:  Negative for discharge and redness.  Gastrointestinal:  Negative for abdominal pain, nausea and vomiting.  Genitourinary:  Negative for genital sores and vaginal discharge.     Physical Exam Triage Vital Signs ED Triage Vitals  Encounter Vitals Group     BP      Systolic BP Percentile      Diastolic BP Percentile      Pulse      Resp      Temp      Temp src      SpO2      Weight      Height      Head Circumference      Peak Flow      Pain Score      Pain Loc      Pain Education      Exclude from Growth Chart    No data found.  Updated Vital Signs BP 115/72 (BP Location: Left Arm)   Pulse 87   Temp 98.8 F (37.1 C) (Oral)   Resp 18   LMP 04/08/2021   SpO2 98%      Physical Exam Vitals and nursing note reviewed.  Constitutional:      General: She is not in acute distress.    Appearance: Normal appearance. She is not ill-appearing.  HENT:     Head: Normocephalic and atraumatic.  Eyes:     Conjunctiva/sclera: Conjunctivae normal.  Cardiovascular:     Rate and Rhythm: Normal rate.  Pulmonary:     Effort: Pulmonary effort is normal. No respiratory distress.  Neurological:     Mental Status: She is alert.  Psychiatric:        Mood and Affect: Mood normal.        Behavior: Behavior normal.        Thought Content: Thought content normal.      UC Treatments / Results  Labs (all labs ordered are listed, but only abnormal results are displayed) Labs Reviewed  CERVICOVAGINAL ANCILLARY ONLY    EKG   Radiology No results found.  Procedures Procedures (including critical care time)  Medications Ordered in UC Medications - No data to display  Initial Impression / Assessment and Plan / UC Course  I have  reviewed the triage vital signs and the nursing notes.  Pertinent labs & imaging results that were available during my care of the patient were reviewed by me and considered in my medical decision making (see chart for details).    Will treat to cover yeast vaginitis with Diflucan.  Screening ordered for yeast, BV, gonorrhea, chlamydia and trichomonas.  Encouraged follow-up if no gradual improvement or with any further concerns.  Final Clinical Impressions(s) / UC Diagnoses   Final diagnoses:  Acute vaginitis   Discharge Instructions   None    ED Prescriptions     Medication Sig Dispense Auth. Provider   fluconazole (DIFLUCAN) 150 MG tablet Take one tab PO today and repeat dose in 3 days if symptoms persist 2 tablet Tomi Bamberger, PA-C      PDMP not reviewed this encounter.   Tomi Bamberger, PA-C 06/19/23 1429

## 2023-06-20 DIAGNOSIS — Z419 Encounter for procedure for purposes other than remedying health state, unspecified: Secondary | ICD-10-CM | POA: Diagnosis not present

## 2023-06-23 ENCOUNTER — Telehealth: Payer: Self-pay

## 2023-06-23 MED ORDER — METRONIDAZOLE 500 MG PO TABS: 500.0000 mg | ORAL_TABLET | Freq: Two times a day (BID) | ORAL | 0 refills | Status: DC

## 2023-06-23 NOTE — Telephone Encounter (Signed)
Pt called regarding test results from swab done on 06/19/23. Pt is positive for BV and was wondering if she needed a different medication sent in. Pt states she is still having symptoms and took the medication that was prescribed.

## 2023-06-23 NOTE — Telephone Encounter (Signed)
Metronidazole prescribed

## 2023-06-24 LAB — CERVICOVAGINAL ANCILLARY ONLY
Bacterial Vaginitis (gardnerella): POSITIVE — AB
Candida Glabrata: NEGATIVE
Candida Vaginitis: NEGATIVE
Chlamydia: NEGATIVE
Comment: NEGATIVE
Comment: NEGATIVE
Comment: NEGATIVE
Comment: NEGATIVE
Comment: NEGATIVE
Comment: NORMAL
Neisseria Gonorrhea: NEGATIVE
Trichomonas: NEGATIVE

## 2023-07-08 DIAGNOSIS — F330 Major depressive disorder, recurrent, mild: Secondary | ICD-10-CM | POA: Diagnosis not present

## 2023-07-08 DIAGNOSIS — F411 Generalized anxiety disorder: Secondary | ICD-10-CM | POA: Diagnosis not present

## 2023-07-12 ENCOUNTER — Encounter: Payer: Self-pay | Admitting: Nurse Practitioner

## 2023-07-12 ENCOUNTER — Ambulatory Visit: Payer: Medicaid Other | Admitting: Nurse Practitioner

## 2023-07-12 VITALS — BP 110/72 | HR 73 | Temp 98.1°F | Resp 16 | Ht 64.0 in | Wt 194.9 lb

## 2023-07-12 DIAGNOSIS — M797 Fibromyalgia: Secondary | ICD-10-CM

## 2023-07-12 DIAGNOSIS — E6609 Other obesity due to excess calories: Secondary | ICD-10-CM | POA: Diagnosis not present

## 2023-07-12 DIAGNOSIS — E66811 Obesity, class 1: Secondary | ICD-10-CM

## 2023-07-12 DIAGNOSIS — Z6834 Body mass index (BMI) 34.0-34.9, adult: Secondary | ICD-10-CM | POA: Diagnosis not present

## 2023-07-12 DIAGNOSIS — G43909 Migraine, unspecified, not intractable, without status migrainosus: Secondary | ICD-10-CM | POA: Diagnosis not present

## 2023-07-12 DIAGNOSIS — F3181 Bipolar II disorder: Secondary | ICD-10-CM

## 2023-07-12 DIAGNOSIS — E782 Mixed hyperlipidemia: Secondary | ICD-10-CM | POA: Diagnosis not present

## 2023-07-12 DIAGNOSIS — J4540 Moderate persistent asthma, uncomplicated: Secondary | ICD-10-CM

## 2023-07-12 DIAGNOSIS — F902 Attention-deficit hyperactivity disorder, combined type: Secondary | ICD-10-CM | POA: Diagnosis not present

## 2023-07-12 DIAGNOSIS — K219 Gastro-esophageal reflux disease without esophagitis: Secondary | ICD-10-CM | POA: Diagnosis not present

## 2023-07-12 MED ORDER — ONDANSETRON 4 MG PO TBDP: 4.0000 mg | ORAL_TABLET | Freq: Three times a day (TID) | ORAL | 0 refills | Status: DC | PRN

## 2023-07-12 MED ORDER — AIRSUPRA 90-80 MCG/ACT IN AERO: 1.0000 | INHALATION_SPRAY | RESPIRATORY_TRACT | 5 refills | Status: DC

## 2023-07-12 NOTE — Assessment & Plan Note (Signed)
Continue working on lifestyle modification.  

## 2023-07-12 NOTE — Assessment & Plan Note (Signed)
stable °

## 2023-07-12 NOTE — Assessment & Plan Note (Signed)
Continue with psychiatry

## 2023-07-12 NOTE — Assessment & Plan Note (Signed)
Will start patient on airsupra

## 2023-07-12 NOTE — Assessment & Plan Note (Signed)
Working with GI, to get better control of her GERD, she had to reschedule her EGD.  Continue current medication.

## 2023-07-12 NOTE — Assessment & Plan Note (Signed)
Continue with psychiatry

## 2023-07-12 NOTE — Progress Notes (Signed)
BP 110/72   Pulse 73   Temp 98.1 F (36.7 C) (Oral)   Resp 16   Ht 5\' 4"  (1.626 m)   Wt 194 lb 14.4 oz (88.4 kg)   LMP 04/08/2021   SpO2 99%   BMI 33.45 kg/m    Subjective:    [name], female    DOB: [birth year], 27 y.o.   MRN: [id number]  HPI: Janet Rich is a 27 y.o. female  Chief Complaint  Patient presents with   Medical Management of Chronic Issues   GERD- currently taking omeprazole 40 mg daily, previously reported worsening symptoms referral was placed to GI. Seen by GI on 05/11/2023. She is going to get a EGD, but had to reschedule it.  GERD control status: uncontrolledSatisfied with current treatment? no Heartburn frequency: often Medication side effects: no  Medication compliance: better Previous GERD medications: omeprazole, pepcid Antacid use frequency:  none Duration: hours Nature: burning, dysphagia Location: throat, chest Alleviatiating factors:  phenergan Aggravating factors: everything Dysphagia: yes Odynophagia:  no Hematemesis: no Blood in stool: no EGD: yes , scheduled by GI  Asthma:    -Asthma status: stable -Current Treatments: albuterol, symbicort -Satisfied with current treatment?: yes -Albuterol/rescue inhaler frequency:  -Dyspnea frequency: no issues -Wheezing frequency:no Symbicort -Cough frequency: not often -Nocturnal symptom frequency: not often -Limitation of activity: no -Current upper respiratory symptoms: no -Triggers: smoking, pet dander, new animals, illness, allergies -Failed/intolerant to following asthma meds:  -Aerochamber/spacer use: no -Visits to ER or Urgent Care in past year: no -Pneumovax: Not up to Date -Influenza: Not up to Date   HLD:  -Medications: none, lifestyle modification -Last lipid panel:  Lipid Panel     Component Value Date/Time   CHOL 225 (H) 01/08/2023 1020   TRIG 317 (H) 01/08/2023 1020   HDL 54 01/08/2023 1020   CHOLHDL 4.2 01/08/2023 1020   LDLCALC 126 (H)  01/08/2023 1020      Depression/anxiety/bipolar/ADHD: she is currently seeing psychiatrist.  [facility].  She is currently on Seroquel 20 mg ?, latuda 40 mg daily, lamictal 200 mg daily, propanolol 60 mg two times a day, xanax 0.25 mg as needed.   she reports she is on dextroamphetamine 10 two times a day. She reports she is still seeing her therapist and psychiatrist.  She reports that she did feel pretty bad after vacation but is doing better.      07/12/2023    7:56 AM 03/02/2023   10:21 AM 01/08/2023    9:35 AM 11/02/2022    2:15 PM 09/09/2022   10:55 AM  Depression screen PHQ 2/9  Decreased Interest 1 0 1 0 0  Down, Depressed, Hopeless 1 0 1 0 0  PHQ - 2 Score 2 0 2 0 0  Altered sleeping 1  2 0   Tired, decreased energy 1  2 0   Change in appetite 1  0 0   Feeling bad or failure about yourself  1  0 0   Trouble concentrating 1  2 0   Moving slowly or fidgety/restless 0  0 0   Suicidal thoughts 1  0 0   PHQ-9 Score 8  8 0   Difficult doing work/chores Somewhat difficult  Somewhat difficult         07/12/2023    7:57 AM 01/08/2023    9:36 AM 11/02/2022    2:15 PM 02/24/2022   12:43 PM  GAD 7 : Generalized Anxiety Score  Nervous, Anxious, on  Edge 1 3 0 3  Control/stop worrying 1 2 0 3  Worry too much - different things 1 2 0 3  Trouble relaxing 1 1 0 3  Restless 1 1 0 3  Easily annoyed or irritable 1 2 0 3  Afraid - awful might happen 1 2 0 3  Total GAD 7 Score 7 13 0 21  Anxiety Difficulty Somewhat difficult Somewhat difficult  Somewhat difficult   Fibromyalgia: she reports she is doing okay.  She says she takes gabapentin 400 mg at bed time.  She is sore during the day but she can't take anything during the day because it makes her sleep.  Rheumatology is managing her care.  No changes, stable  Migraines: managed by neurology, last seen on 01/19/2023. Started on  Topamax working up to 50 mg at bedtime for migraine preventative. Try Maxalt 10 mg as needed for  acute headache. Patient reports she is doing better, she says that she is getting injections at neurology.  She says she still has a lot of migraines but they have improved. She does not have a f/u scheduled with neurology at this time.   Relevant past medical, surgical, family and social history reviewed and updated as indicated. Interim medical history since our last visit reviewed. Allergies and medications reviewed and updated.  Review of Systems  Constitutional: Negative for fever or weight change.  Respiratory: Negative for cough and shortness of breath.   Cardiovascular: Negative for chest pain or palpitations.  Gastrointestinal: Negative for abdominal pain, no bowel changes. Positive for acid reflux and vomiting Musculoskeletal: Negative for gait problem or joint swelling.  Skin: Negative for rash.  Neurological: Negative for dizziness or headache.  No other specific complaints in a complete review of systems (except as listed in HPI above).      Objective:    BP 110/72   Pulse 73   Temp 98.1 F (36.7 C) (Oral)   Resp 16   Ht 5\' 4"  (1.626 m)   Wt 194 lb 14.4 oz (88.4 kg)   LMP 04/08/2021   SpO2 99%   BMI 33.45 kg/m   Wt Readings from Last 3 Encounters:  07/12/23 194 lb 14.4 oz (88.4 kg)  05/11/23 203 lb (92.1 kg)  03/24/23 203 lb 4.8 oz (92.2 kg)    Physical Exam  Constitutional: Patient appears well-developed and well-nourished. Obese  No distress.  HEENT: head atraumatic, normocephalic, pupils equal and reactive to light, neck supple, throat within normal limits Cardiovascular: Normal rate, regular rhythm and normal heart sounds.  No murmur heard. No BLE edema. Pulmonary/Chest: Effort normal and breath sounds normal. No respiratory distress. Abdominal: Soft.  There is no tenderness. Psychiatric: Patient has a normal mood and affect. behavior is normal. Judgment and thought content normal.   Results for orders placed or performed during the hospital encounter of  06/19/23  Cervicovaginal ancillary only  Result Value Ref Range   Neisseria Gonorrhea Negative    Chlamydia Negative    Trichomonas Negative    Bacterial Vaginitis (gardnerella) Positive (A)    Candida Vaginitis Negative    Candida Glabrata Negative    Comment Normal Reference Range Candida Species - Negative    Comment Normal Reference Range Candida Galbrata - Negative    Comment Normal Reference Range Trichomonas - Negative    Comment Normal Reference Ranger Chlamydia - Negative    Comment      Normal Reference Range Neisseria Gonorrhea - Negative   Comment  Normal Reference Range Bacterial Vaginosis - Negative      Assessment & Rich:   Problem List Items Addressed This Visit       Cardiovascular and Mediastinum   Migraine without status migrainosus, not intractable    Continue follow up with neurology        Respiratory   Moderate persistent asthma    Will start patient on airsupra      Relevant Medications   Albuterol-Budesonide (AIRSUPRA) 90-80 MCG/ACT AERO     Digestive   Gastroesophageal reflux disease without esophagitis - Primary    Working with GI, to get better control of her GERD, she had to reschedule her EGD.  Continue current medication.        Relevant Medications   ondansetron (ZOFRAN-ODT) 4 MG disintegrating tablet     Other   Bipolar 2 disorder (HCC)    Continue with psychiatry      Class 1 obesity due to excess calories with serious comorbidity and body mass index (BMI) of 34.0 to 34.9 in adult    Continue working on lifestyle modification      Relevant Medications   dextroamphetamine (DEXTROSTAT) 10 MG tablet   Fibromyalgia    stable      Attention deficit hyperactivity disorder (ADHD), combined type    Continue with psychiatry      Mixed hyperlipidemia    Continue working on lifestyle modification         [name]: Return in about 6 months (around 01/09/2024) for follow up.

## 2023-07-12 NOTE — Assessment & Plan Note (Signed)
Continue follow-up with neurology. 

## 2023-07-16 DIAGNOSIS — F330 Major depressive disorder, recurrent, mild: Secondary | ICD-10-CM | POA: Diagnosis not present

## 2023-07-20 DIAGNOSIS — Z419 Encounter for procedure for purposes other than remedying health state, unspecified: Secondary | ICD-10-CM | POA: Diagnosis not present

## 2023-07-21 DIAGNOSIS — F411 Generalized anxiety disorder: Secondary | ICD-10-CM | POA: Diagnosis not present

## 2023-07-21 DIAGNOSIS — F330 Major depressive disorder, recurrent, mild: Secondary | ICD-10-CM | POA: Diagnosis not present

## 2023-07-28 DIAGNOSIS — F411 Generalized anxiety disorder: Secondary | ICD-10-CM | POA: Diagnosis not present

## 2023-07-28 DIAGNOSIS — F330 Major depressive disorder, recurrent, mild: Secondary | ICD-10-CM | POA: Diagnosis not present

## 2023-08-02 NOTE — Progress Notes (Deleted)
Patient: Janet Rich Date of Birth: 07-Jul-1996  Reason for Visit: Follow up History from: Patient Primary Neurologist: Janet Rich  ASSESSMENT AND PLAN 27 y.o. year old female   1.  Chronic migraine headache 2.  Bipolar disorder, anxiety, depression -Start Topamax working up to 50 mg at bedtime for migraine preventative -Try Maxalt 10 mg as needed for acute headache -Previously tried and failed: Antidepressants are contraindicated due to suicidal ideation; currently taking propranolol, Lamictal, gabapentin, Latuda; Imitrex was not helpful, ibuprofen -Next steps: Try again for Emgality, was denied by insurance, we may have to appeal, but based on the previous denial she should meet criteria, but we will try Topamax -She has had a hysterectomy -Encouraged to reach out via MyChart if needed -Follow-up in 6 months or sooner if needed  HISTORY OF PRESENT ILLNESS: Today 08/02/23   01/19/23 SS: Migraines are doing awful, hasn't started Emgality, it was denied, we appealed but she didn't sign the letter, wanted to wait to see if her insurance changed. Not sure if her Medicaid would be continued, will know at the end of the month. Right now 2-3 migraines a week. Takes ibuprofen at night to go to sleep. Under a lot of stress. Finishing up her bachelor's in literary studies then go into [employer]. Migraines are frontal, has n/v, sensitivity to light sound. Imitrex makes her sleepy, nauseated. Her PCP gave her a few phenergan tablets. She had hysterectomy.   HISTORY  July 30, 2022 SS: Here today for follow-up virtually.  Continues to struggle with frequent migraine headache.  Often with dizziness.  Reports daily headache, 2-3 migraines a week.  Tried prescription strength 800 mg ibuprofen, Excedrin Migraine.  In the remote past has taken Imitrex 25 mg with good effect, but made her sleepy.  Current migraines reports dizziniess, sensitivity to light, sound, nausea.  She is a stay-at-home  mom, has a 45-year-old, set of almost  56-year-old twins.  Has been on few rounds of prednisone, would like to avoid if possible. In March 2023 MRI of the brain with and without contrast with IAC was normal.    12/05/21 Dr. Lucia Rich HPI:  Janet Rich is a 27 y.o. female here as requested by Janet Salines, FNP for new chief complaint migraines with vertigo.  I saw patient in 2021 for different symptoms Right sided facial pain, she has a past medical history of PCOS, depression, headache, back pain, bipolar 2, anxiety and trigeminal neuralgia, myalgia, elevated blood pressure without diagnosis of hypertension, preeclampsia and preterm labor with twins, IBS, GERD, obesity BMI 38, migraines, anxiety and depression.  At the time I saw her she was having some shooting pain into the proximal jaw on and off.  At that time she told us that she had shooting pain that radiated in front of the ear did not really shoot in the face just kind of hurt in front of the ear brief and severe but then pain lingers, she saw an ENT ongoing for several years, sensitive to touch ongoing daily, at the time she was [redacted] weeks pregnant with twins and she reported decreased hearing on the right side.  Thorough neurologic and physical examination were normal.  We ordered an MRI of the brain and started a Medrol Dosepak at the time, we referred to ENT, patient did not follow-up with [place].   She was referred here by Dr. Zane Herald and I reviewed her notes from November 10, 2021, patient reported she has had vertigo for a week,  migraines/headaches, especially in the morning, when she moves her head she gets dizzy, she had this in the past and was seen at ENT and taught the Epley maneuvers, at that point she was referred to neurology and had an MRI as above, she was supposed to return for an MRI with contrast after the birth of her girls but she did not, she says she takes ibuprofen for migraines, it usually works for her, discussed that there are other  treatments for migraine she may want to look into and she agreed to come to neurology.   Patient has a lot of dizziness, positional headaches and migraines, vision changes, can't bend over or move fast makes it worse, room is spinnng, worse in the morning when supine with headaches, she has not been performing her epley maneuvers. She is dizzy every day. Vertigo has been severe. She had vertigo In the past but nothing like this. She is having vision changes, recommended eye doctor (floaters), she has headaches, pulsating/pounding/throbbing with nausea and light sensitivity, her ear on the right is painful, started with an inner ear infection treated with antibiotics and ear drops. Also having daily chronic headaches and migraines for the last month but usually has a lot of migraines the month. Prior to this only 3 days a month of migraines. She drinks 3 bottles water a day. No weakness. No other focal neurologic deficits, associated symptoms, inciting events or modifiable factors.   Reviewed notes, labs and imaging from outside physicians, which showed:   09/2021: BUN 10, creat 0.59   MRI brain 05/16/2020: MRI brain (without) demonstrating: personally reviewed imaging and agree - Tiny vessels noted near the right trigeminal nerve cisternal segment.  - Brain parenchyma is otherwise unremarkable.  Personally reviewed and agree.     From a thorough review of records: Medications tried that can be used in migraine management include: Tylenol, amitriptyline, aspirin, Flexeril, Decadron, Benadryl, diclofenac tablets, gabapentin, ibuprofen, Tylenol, Aleve, ketorolac injections, Lamictal, lidocaine injections, meclizine, Mobic tablets, methylprednisolone, Reglan tablets, Mobic, nifedipine (calcium channel blocker similar to verapamil), propranolol contraindicated due to asthma, Zofran, prednisone tablets, Compazine injections, Phenergan tablets and suppositories and injections, Seroquel, Zoloft, Imitrex,  tramadol,worse with standing and changing positions.  REVIEW OF SYSTEMS: Out of a complete 14 system review of symptoms, the patient complains only of the following symptoms, and all other reviewed systems are negative.  See HPI  ALLERGIES: Allergies  Allergen Reactions   Amoxicillin Shortness Of Breath and Nausea Only    Dizziness   Other     Must have anti-emetic prior to being given any narcotics    Latex Rash and Other (See Comments)    HOME MEDICATIONS: Outpatient Medications Prior to Visit  Medication Sig Dispense Refill   acetaminophen (TYLENOL) 500 MG tablet Take 2 tablets (1,000 mg total) by mouth every 6 (six) hours as needed for moderate pain. 30 tablet 0   albuterol (PROVENTIL) (2.5 MG/3ML) 0.083% nebulizer solution Take 3 mLs (2.5 mg total) by nebulization every 6 (six) hours as needed for wheezing or shortness of breath. 75 mL 12   albuterol (VENTOLIN HFA) 108 (90 Base) MCG/ACT inhaler Inhale 1-2 puffs into the lungs every 6 (six) hours as needed for wheezing or shortness of breath. 1 each 3   Albuterol-Budesonide (AIRSUPRA) 90-80 MCG/ACT AERO Inhale 1-2 puffs into the lungs as directed. 10.7 g 5   ALPRAZolam (XANAX) 0.25 MG tablet Take 0.25 mg by mouth at bedtime as needed for anxiety.     Ascorbic  Acid (VITAMIN C) 100 MG tablet Take 100 mg by mouth daily.     budesonide-formoterol (SYMBICORT) 160-4.5 MCG/ACT inhaler Inhale 2 puffs into the lungs 2 (two) times daily. 1 each 3   cholecalciferol (VITAMIN D3) 25 MCG (1000 UNIT) tablet Take 1,000 Units by mouth daily.     dextroamphetamine (DEXTROSTAT) 10 MG tablet Take 10 mg by mouth 2 (two) times daily.     famotidine (PEPCID) 20 MG tablet Take 1 tablet (20 mg total) by mouth 2 (two) times daily. 180 tablet 1   gabapentin (NEURONTIN) 100 MG capsule Take 400 mg by mouth at bedtime.     ibuprofen (ADVIL) 800 MG tablet Take 1 tablet (800 mg total) by mouth every 8 (eight) hours as needed. 30 tablet 2   lamoTRIgine (LAMICTAL)  200 MG tablet Take 200 mg by mouth at bedtime.     Lurasidone HCl 60 MG TABS Take 60 mg by mouth every evening.     MAGNESIUM MALATE PO Take 1 tablet by mouth daily at 6 (six) AM.     metroNIDAZOLE (FLAGYL) 500 MG tablet Take 1 tablet (500 mg total) by mouth 2 (two) times daily. 14 tablet 0   Multiple Vitamin (MULTIVITAMIN ADULT PO) Take 1 tablet by mouth daily.     omeprazole (PRILOSEC) 40 MG capsule Take 1 capsule (40 mg total) by mouth 2 (two) times daily before a meal. 60 capsule 1   ondansetron (ZOFRAN-ODT) 4 MG disintegrating tablet Take 1 tablet (4 mg total) by mouth every 8 (eight) hours as needed for nausea or vomiting. 30 tablet 0   promethazine (PHENERGAN) 25 MG tablet Take 1 tablet (25 mg total) by mouth every 6 (six) hours as needed for nausea or vomiting. 12 tablet 0   propranolol (INDERAL) 40 MG tablet Take 40 mg by mouth.     QUEtiapine (SEROQUEL) 25 MG tablet Take 25 mg by mouth at bedtime.     rizatriptan (MAXALT) 10 MG tablet Take 1 tablet (10 mg total) by mouth as needed for migraine. May repeat in 2 hours if needed 10 tablet 5   topiramate (TOPAMAX) 25 MG tablet Take 1 tablet at bedtime x 1 week, then take 2 tablets at bedtime 60 tablet 5   vitamin B-12 (CYANOCOBALAMIN) 500 MCG tablet Take 500 mcg by mouth daily.     No facility-administered medications prior to visit.    PAST MEDICAL HISTORY: Past Medical History:  Diagnosis Date   Anemia    with periods   Ankle fracture    right   Anxiety    Asthma    Back pain    Bipolar 1 disorder (HCC)    Depression    Bipolar    Endometriosis    Fibromyalgia    GERD (gastroesophageal reflux disease)    Headache    MIGRAINES   Hyperlipidemia    IBS (irritable bowel syndrome)    Ovarian cyst    PCOS (polycystic ovarian syndrome)    Pneumonia    age 26   PONV (postoperative nausea and vomiting)    nausea after wisdom teeth extraction   Scoliosis    Vaginal Pap smear, abnormal    bx ok    PAST SURGICAL  HISTORY: Past Surgical History:  Procedure Laterality Date   ABDOMINAL HYSTERECTOMY     FRACTURE SURGERY     ankle surgery with impant    HYSTEROSCOPY WITH D & C N/A 09/22/2019   Procedure: DILATATION AND CURETTAGE /HYSTEROSCOPY;  Surgeon: Ward, [facility]  C, MD;  Location: [facility] OR[facility];  Service: Gynecology;  Laterality: N/A;   LYSIS OF ADHESION N/A 05/08/2021   Procedure: LYSIS OF ADHESION, EXCISION OF ENDOMETRIOSIS;  Surgeon: Christeen Douglas, MD;  Location: [facility] OR[facility];  Service: Gynecology;  Laterality: N/A;   ROBOTIC ASSISTED DIAGNOSTIC LAPAROSCOPY N/A 02/18/2017   Procedure: ROBOTIC ASSISTED DIAGNOSTIC LAPAROSCOPY,EXCISION AND ABLATION OF ENDOMETRIOSIS,LYSIS OF ADHESIONS;  Surgeon: Olivia Mackie, MD;  Location: [facility];  Service: Gynecology;  Laterality: N/A;   ROBOTIC ASSISTED LAPAROSCOPIC HYSTERECTOMY AND SALPINGECTOMY Bilateral 05/08/2021   Procedure: XI ROBOTIC ASSISTED TOTAL LAPAROSCOPIC HYSTERECTOMY AND SALPINGECTOMY;  Surgeon: Christeen Douglas, MD;  Location: [facility] OR[facility];  Service: Gynecology;  Laterality: Bilateral;   VAGINAL DELIVERY N/A 08/14/2020   Procedure: VAGINAL DELIVERY;  Surgeon: Feliberto Gottron Ihor Austin, MD;  Location: [facility];  Service: Obstetrics;  Laterality: N/A;   WISDOM TOOTH EXTRACTION  2013    FAMILY HISTORY: Family History  Problem Relation Age of Onset   Endometriosis Mother    Bipolar disorder Mother    Diabetes Mother        prediabetes   Schizophrenia Father    Depression Father    Breast cancer Paternal Grandmother    Cancer Paternal Grandmother    Depression Paternal Grandmother    Hypertension Other    Hyperlipidemia Other    Asthma Other    Heart disease Neg Hx    Colon cancer Neg Hx    Ovarian cancer Neg Hx     SOCIAL HISTORY: Social History   Socioeconomic History   Marital status: Married    Spouse name: Renae Fickle   Number of children: 3   Years of education: Not on file   Highest education level: Associate degree: academic program   Occupational History   Occupation: unemployeed   Tobacco Use   Smoking status: Former    Current packs/day: 0.00    Types: Cigarettes    Start date: 12/20/2008    Quit date: 12/20/2009    Years since quitting: 13.6   Smokeless tobacco: Never   Tobacco comments:    age [age], quit, 1 cig daily  Vaping Use   Vaping status: Never Used  Substance and Sexual Activity   Alcohol use: Not Currently   Drug use: No   Sexual activity: Yes    Partners: Female, Female    Birth control/protection: Surgical    Comment: hysterectomy  Other Topics Concern   Not on file  Social History Narrative   Lives at home with husband, son, and 1 year old twin girls    Right handed   Currently [redacted] weeks pregnant with twins (as of 01/17/2020)  ---update 03/02/2023 twins are now 29 years old    Caffeine: every once in awhile    Social Determinants of Health   Financial Resource Strain: Low Risk  (08/12/2018)   Overall Financial Resource Strain (CARDIA)    Difficulty of Paying Living Expenses: Not hard at all  Food Insecurity: Food Insecurity Present (03/02/2023)   Hunger Vital Sign    Worried About Running Out of Food in the Last Year: Sometimes true    Ran Out of Food in the Last Year: Never true  Transportation Needs: No Transportation Needs (08/12/2018)   PRAPARE - Administrator, Civil Service (Medical): No    Lack of Transportation (Non-Medical): No  Physical Activity: Inactive (08/12/2018)   Exercise Vital Sign    Days of Exercise per Week: 0 days    Minutes of Exercise per Session: 0 min  Stress: Stress Concern Present (02/28/2020)   [employer] of Occupational Health - Occupational Stress Questionnaire    Feeling of Stress : Rather much  Social Connections: Moderately Isolated (02/28/2020)   Social Connection and Isolation Panel [NHANES]    Frequency of Communication with Friends and Family: More than three times a week    Frequency of Social Gatherings with Friends and Family: More  than three times a week    Attends Religious Services: Never    Database administrator or Organizations: No    Attends Banker Meetings: Never    Marital Status: Married  Catering manager Violence: Not At Risk (08/12/2018)   Humiliation, Afraid, Rape, and Kick questionnaire    Fear of Current or Ex-Partner: No    Emotionally Abused: No    Physically Abused: No    Sexually Abused: No    PHYSICAL EXAM  There were no vitals filed for this visit.  There is no height or weight on file to calculate BMI.  Generalized: Well developed, in no acute distress  Neurological examination  Mentation: Alert oriented to time, place, history taking. Follows all commands speech and language fluent Cranial nerve II-XII: Pupils were equal round reactive to light. Extraocular movements were full, visual field were full on confrontational test. Facial sensation and strength were normal. Head turning and shoulder shrug  were normal and symmetric. Motor: The motor testing reveals 5 over 5 strength of all 4 extremities. Good symmetric motor tone is noted throughout.  Sensory: Sensory testing is intact to soft touch on all 4 extremities. No evidence of extinction is noted.  Coordination: Cerebellar testing reveals good finger-nose-finger and heel-to-shin bilaterally.  Gait and station: Gait is normal.  Reflexes: Deep tendon reflexes are symmetric and normal bilaterally.   DIAGNOSTIC DATA (LABS, IMAGING, TESTING) - I reviewed patient records, labs, notes, testing and imaging myself where available.  Lab Results  Component Value Date   WBC 10.6 03/24/2023   HGB 13.9 03/24/2023   HCT 41.7 03/24/2023   MCV 88.5 03/24/2023   PLT 365 03/24/2023      Component Value Date/Time   NA 138 03/24/2023 1422   NA 138 08/25/2018 1448   NA 138 07/11/2014 1446   K 4.4 03/24/2023 1422   K 3.8 07/11/2014 1446   CL 102 03/24/2023 1422   CL 102 07/11/2014 1446   CO2 23 03/24/2023 1422   CO2 29  07/11/2014 1446   GLUCOSE 97 03/24/2023 1422   GLUCOSE 89 07/11/2014 1446   BUN 9 03/24/2023 1422   BUN 11 08/25/2018 1448   BUN 10 07/11/2014 1446   CREATININE 0.59 03/24/2023 1422   CALCIUM 9.4 03/24/2023 1422   CALCIUM 9.2 07/11/2014 1446   PROT 7.1 03/24/2023 1422   PROT 6.9 08/25/2018 1448   PROT 7.5 08/26/2013 0821   ALBUMIN 3.6 09/08/2022 2257   ALBUMIN 4.1 08/25/2018 1448   ALBUMIN 3.4 (L) 08/26/2013 0821   AST 13 03/24/2023 1422   AST 17 08/26/2013 0821   ALT 13 03/24/2023 1422   ALT 30 08/26/2013 0821   ALKPHOS 58 09/08/2022 2257   ALKPHOS 102 08/26/2013 0821   BILITOT 0.2 03/24/2023 1422   BILITOT <0.2 08/25/2018 1448   BILITOT 0.2 08/26/2013 0821   GFRNONAA >60 09/08/2022 2257   GFRNONAA 126 12/14/2018 0000   GFRAA >60 03/01/2020 2103   GFRAA 146 12/14/2018 0000   Lab Results  Component Value Date   CHOL 225 (H) 01/08/2023   HDL 54  01/08/2023   LDLCALC 126 (H) 01/08/2023   TRIG 317 (H) 01/08/2023   CHOLHDL 4.2 01/08/2023   Lab Results  Component Value Date   HGBA1C 5.4 01/08/2023   Lab Results  Component Value Date   VITAMINB12 376 03/11/2021   Lab Results  Component Value Date   TSH 0.71 10/09/2021    Margie Ege, AGNP-C, DNP 08/02/2023, 9:23 PM [facility] [address], [address] [place], [place] [address] [phone]

## 2023-08-03 ENCOUNTER — Ambulatory Visit: Payer: Medicaid Other | Admitting: Neurology

## 2023-08-03 ENCOUNTER — Encounter: Payer: Self-pay | Admitting: Neurology

## 2023-08-04 DIAGNOSIS — F330 Major depressive disorder, recurrent, mild: Secondary | ICD-10-CM | POA: Diagnosis not present

## 2023-08-04 DIAGNOSIS — F411 Generalized anxiety disorder: Secondary | ICD-10-CM | POA: Diagnosis not present

## 2023-08-11 DIAGNOSIS — F330 Major depressive disorder, recurrent, mild: Secondary | ICD-10-CM | POA: Diagnosis not present

## 2023-08-11 DIAGNOSIS — F411 Generalized anxiety disorder: Secondary | ICD-10-CM | POA: Diagnosis not present

## 2023-08-13 ENCOUNTER — Other Ambulatory Visit: Payer: Self-pay | Admitting: Nurse Practitioner

## 2023-08-13 NOTE — Telephone Encounter (Signed)
Medication Refill - Medication: gabapentin (NEURONTIN) 100 MG capsul  Has the patient contacted their pharmacy? No.     Preferred Pharmacy (with phone number or street name):   [phone] [place], [place] - [address]  [address] [place] [address] [place] [address]  Phone: [phone] Fax: [phone]  Hours: Not open 24 hours     Has the patient been seen for an appointment in the last year OR does the patient have an upcoming appointment? Yes.    Agent: Please be advised that RX refills may take up to 3 business days. We ask that you follow-up with your pharmacy.

## 2023-08-16 MED ORDER — GABAPENTIN 100 MG PO CAPS: 400.0000 mg | ORAL_CAPSULE | Freq: Every day | ORAL | 4 refills | Status: DC

## 2023-08-16 NOTE — Telephone Encounter (Signed)
Requested medication (s) are due for refill today: yes  Requested medication (s) are on the active medication list: historical med  Last refill:  06/09/22  Future visit scheduled: yes  Notes to clinic:  historical provider   Requested Prescriptions  Pending Prescriptions Disp Refills   gabapentin (NEURONTIN) 100 MG capsule      Sig: Take 4 capsules (400 mg total) by mouth at bedtime.     Neurology: Anticonvulsants - gabapentin Passed - 08/13/2023  1:48 PM      Passed - Cr in normal range and within 360 days    Creat  Date Value Ref Range Status  03/24/2023 0.59 0.50 - 0.96 mg/dL Final   Creatinine, Urine  Date Value Ref Range Status  08/14/2020 112 mg/dL Final         Passed - Completed PHQ-2 or PHQ-9 in the last 360 days      Passed - Valid encounter within last 12 months    Recent Outpatient Visits           1 month ago Gastroesophageal reflux disease without esophagitis   [facility] Berniece Salines, FNP   4 months ago Hemoptysis   [facility] Della Goo F, FNP   7 months ago Gastroesophageal reflux disease without esophagitis   [facility] Berniece Salines, FNP   9 months ago Mild intermittent asthma with acute exacerbation   [facility] Alba Cory, MD   11 months ago Nausea and vomiting, unspecified vomiting type   [facility] Berniece Salines, FNP       Future Appointments             In 4 months Zane Herald, Rudolpho Sevin, FNP [facility], [facility]

## 2023-08-16 NOTE — Telephone Encounter (Signed)
Historical provider.  

## 2023-08-18 DIAGNOSIS — F330 Major depressive disorder, recurrent, mild: Secondary | ICD-10-CM | POA: Diagnosis not present

## 2023-08-18 DIAGNOSIS — F411 Generalized anxiety disorder: Secondary | ICD-10-CM | POA: Diagnosis not present

## 2023-08-20 DIAGNOSIS — Z419 Encounter for procedure for purposes other than remedying health state, unspecified: Secondary | ICD-10-CM | POA: Diagnosis not present

## 2023-08-25 DIAGNOSIS — F411 Generalized anxiety disorder: Secondary | ICD-10-CM | POA: Diagnosis not present

## 2023-08-25 DIAGNOSIS — F330 Major depressive disorder, recurrent, mild: Secondary | ICD-10-CM | POA: Diagnosis not present

## 2023-09-01 DIAGNOSIS — F411 Generalized anxiety disorder: Secondary | ICD-10-CM | POA: Diagnosis not present

## 2023-09-01 DIAGNOSIS — F330 Major depressive disorder, recurrent, mild: Secondary | ICD-10-CM | POA: Diagnosis not present

## 2023-09-08 DIAGNOSIS — F411 Generalized anxiety disorder: Secondary | ICD-10-CM | POA: Diagnosis not present

## 2023-09-08 DIAGNOSIS — F330 Major depressive disorder, recurrent, mild: Secondary | ICD-10-CM | POA: Diagnosis not present

## 2023-09-14 DIAGNOSIS — F411 Generalized anxiety disorder: Secondary | ICD-10-CM | POA: Diagnosis not present

## 2023-09-14 DIAGNOSIS — F330 Major depressive disorder, recurrent, mild: Secondary | ICD-10-CM | POA: Diagnosis not present

## 2023-09-19 DIAGNOSIS — Z419 Encounter for procedure for purposes other than remedying health state, unspecified: Secondary | ICD-10-CM | POA: Diagnosis not present

## 2023-09-22 DIAGNOSIS — F330 Major depressive disorder, recurrent, mild: Secondary | ICD-10-CM | POA: Diagnosis not present

## 2023-09-22 DIAGNOSIS — F411 Generalized anxiety disorder: Secondary | ICD-10-CM | POA: Diagnosis not present

## 2023-09-27 ENCOUNTER — Other Ambulatory Visit: Payer: Self-pay | Admitting: Nurse Practitioner

## 2023-09-27 DIAGNOSIS — F411 Generalized anxiety disorder: Secondary | ICD-10-CM | POA: Diagnosis not present

## 2023-09-27 DIAGNOSIS — F3181 Bipolar II disorder: Secondary | ICD-10-CM | POA: Diagnosis not present

## 2023-09-27 DIAGNOSIS — K219 Gastro-esophageal reflux disease without esophagitis: Secondary | ICD-10-CM

## 2023-09-27 DIAGNOSIS — F900 Attention-deficit hyperactivity disorder, predominantly inattentive type: Secondary | ICD-10-CM | POA: Diagnosis not present

## 2023-09-27 NOTE — Telephone Encounter (Signed)
Medication Refill -  Most Recent Primary Care Visit:  Provider: Della Goo F  Department: [facility]  Visit Type: OFFICE VISIT  Date: 07/12/2023  Medication: ondansetron (ZOFRAN-ODT) 4 MG disintegrating tablet [[id number]]   Has the patient contacted their pharmacy? Yes   Is this the correct pharmacy for this prescription? Yes  [id number], [place] - [address] [place] [address] Phone: [phone] Fax: [phone]   Has the prescription been filled recently? Yes  Is the patient out of the medication? Yes  Has the patient been seen for an appointment in the last year OR does the patient have an upcoming appointment? Yes  Can we respond through MyChart? Yes  Agent: Please be advised that Rx refills may take up to 3 business days. We ask that you follow-up with your pharmacy.

## 2023-09-28 NOTE — Telephone Encounter (Signed)
Requested medications are due for refill today.  yes  Requested medications are on the active medications list.  yes  Last refill. 07/12/2023 #30 0 rf  Future visit scheduled.   yes  Notes to clinic.  Refill not delegated.    Requested Prescriptions  Pending Prescriptions Disp Refills   ondansetron (ZOFRAN-ODT) 4 MG disintegrating tablet 30 tablet 0    Sig: Take 1 tablet (4 mg total) by mouth every 8 (eight) hours as needed for nausea or vomiting.     Not Delegated - Gastroenterology: Antiemetics - ondansetron Failed - 09/27/2023  5:49 PM      Failed - This refill cannot be delegated      Passed - AST in normal range and within 360 days    AST  Date Value Ref Range Status  03/24/2023 13 10 - 30 U/L Final   SGOT(AST)  Date Value Ref Range Status  08/26/2013 17 0 - 26 Unit/L Final         Passed - ALT in normal range and within 360 days    ALT  Date Value Ref Range Status  03/24/2023 13 6 - 29 U/L Final   SGPT (ALT)  Date Value Ref Range Status  08/26/2013 30 12 - 78 U/L Final         Passed - Valid encounter within last 6 months    Recent Outpatient Visits           2 months ago Gastroesophageal reflux disease without esophagitis   [facility] Berniece Salines, FNP   6 months ago Hemoptysis   [facility] Della Goo F, FNP   8 months ago Gastroesophageal reflux disease without esophagitis   [facility] Berniece Salines, FNP   11 months ago Mild intermittent asthma with acute exacerbation   [facility] Alba Cory, MD   1 year ago Nausea and vomiting, unspecified vomiting type   [facility] Berniece Salines, FNP       Future Appointments             In 6 days Zane Herald, Rudolpho Sevin, FNP [facility], [facility]   In 3 months Zane Herald, Rudolpho Sevin, FNP [facility], [facility]

## 2023-09-29 MED ORDER — ONDANSETRON 4 MG PO TBDP: 4.0000 mg | ORAL_TABLET | Freq: Three times a day (TID) | ORAL | 0 refills | Status: DC | PRN

## 2023-10-03 DIAGNOSIS — F411 Generalized anxiety disorder: Secondary | ICD-10-CM | POA: Diagnosis not present

## 2023-10-03 DIAGNOSIS — F330 Major depressive disorder, recurrent, mild: Secondary | ICD-10-CM | POA: Diagnosis not present

## 2023-10-04 ENCOUNTER — Encounter: Payer: Self-pay | Admitting: Nurse Practitioner

## 2023-10-04 ENCOUNTER — Ambulatory Visit (INDEPENDENT_AMBULATORY_CARE_PROVIDER_SITE_OTHER): Payer: Medicaid Other | Admitting: Nurse Practitioner

## 2023-10-04 VITALS — BP 112/72 | HR 87 | Temp 97.8°F | Resp 16 | Ht 61.0 in | Wt 191.2 lb

## 2023-10-04 DIAGNOSIS — F3181 Bipolar II disorder: Secondary | ICD-10-CM

## 2023-10-04 DIAGNOSIS — F419 Anxiety disorder, unspecified: Secondary | ICD-10-CM | POA: Diagnosis not present

## 2023-10-04 DIAGNOSIS — J4540 Moderate persistent asthma, uncomplicated: Secondary | ICD-10-CM

## 2023-10-04 MED ORDER — AIRSUPRA 90-80 MCG/ACT IN AERO
1.0000 | INHALATION_SPRAY | RESPIRATORY_TRACT | 5 refills | Status: DC
Start: 2023-10-04 — End: 2024-01-10

## 2023-10-04 NOTE — Progress Notes (Signed)
BP 112/72 (BP Location: Right Arm, Patient Position: Sitting, Cuff Size: Normal)   Pulse 87   Temp 97.8 F (36.6 C) (Oral)   Resp 16   Ht 5\' 1"  (1.549 m)   Wt 191 lb 3.2 oz (86.7 kg)   LMP 04/08/2021   SpO2 97%   BMI 36.13 kg/m    Subjective:    [name], female    DOB: [birth year], 27 y.o.   MRN: [id number]  HPI: Janet Rich is a 27 y.o. female  Chief Complaint  Patient presents with   Anxiety   Weight Loss    Discussed the use of AI scribe software for clinical note transcription with the patient, who gave verbal consent to proceed.  History of Present Illness   The patient, with a history of depression, anxiety, bipolar disorder, and ADHD, presents with a positive depression screening. She is currently on a regimen of Xanax, dextroamphetamine, Lamictal, Lurasidone, propranolol, Seroquel. The patient reports a significant deterioration in mental health over the past semester, characterized by daily vomiting, heightened anxiety, and difficulty managing daily life and school responsibilities. The patient also reports weight loss, with a decrease from 203 pounds in June to 191 pounds at the time of the visit. The patient expresses concern that the current medication regimen is not effective and suspects that she may have undiagnosed autism, a suspicion that arose while researching autism for her daughter. The patient reports difficulty understanding spoken language without captions and gets easily overwhelmed and overstimulated, leading to physical symptoms such as vomiting.       10/04/2023    3:27 PM 07/12/2023    7:56 AM 03/02/2023   10:21 AM  Depression screen PHQ 2/9  Decreased Interest 3 1 0  Down, Depressed, Hopeless 3 1 0  PHQ - 2 Score 6 2 0  Altered sleeping 3 1   Tired, decreased energy 3 1   Change in appetite 1 1   Feeling bad or failure about yourself  3 1   Trouble concentrating 3 1   Moving slowly or fidgety/restless 1 0   Suicidal  thoughts 0 1   PHQ-9 Score 20 8   Difficult doing work/chores Extremely dIfficult Somewhat difficult        10/04/2023    3:33 PM 07/12/2023    7:57 AM 01/08/2023    9:36 AM 11/02/2022    2:15 PM  GAD 7 : Generalized Anxiety Score  Nervous, Anxious, on Edge 3 1 3  0  Control/stop worrying 3 1 2  0  Worry too much - different things 3 1 2  0  Trouble relaxing 3 1 1  0  Restless 3 1 1  0  Easily annoyed or irritable 3 1 2  0  Afraid - awful might happen 3 1 2  0  Total GAD 7 Score 21 7 13  0  Anxiety Difficulty Extremely difficult Somewhat difficult Somewhat difficult      Relevant past medical, surgical, family and social history reviewed and updated as indicated. Interim medical history since our last visit reviewed. Allergies and medications reviewed and updated.  Review of Systems  Constitutional: Negative for fever, positive weight change.  Respiratory: Negative for cough and shortness of breath.   Cardiovascular: Negative for chest pain or palpitations.  Gastrointestinal: Negative for abdominal pain, no bowel changes.  Musculoskeletal: Negative for gait problem or joint swelling.  Skin: Negative for rash.  Neurological: Negative for dizziness or headache.  No other specific complaints in a complete review of systems (  except as listed in HPI above).      Objective:    BP 112/72 (BP Location: Right Arm, Patient Position: Sitting, Cuff Size: Normal)   Pulse 87   Temp 97.8 F (36.6 C) (Oral)   Resp 16   Ht 5\' 1"  (1.549 m)   Wt 191 lb 3.2 oz (86.7 kg)   LMP 04/08/2021   SpO2 97%   BMI 36.13 kg/m   Wt Readings from Last 3 Encounters:  10/04/23 191 lb 3.2 oz (86.7 kg)  07/12/23 194 lb 14.4 oz (88.4 kg)  05/11/23 203 lb (92.1 kg)     Wt Readings from Last 3 Encounters:  10/04/23 191 lb 3.2 oz (86.7 kg)  07/12/23 194 lb 14.4 oz (88.4 kg)  05/11/23 203 lb (92.1 kg)    Physical Exam  Constitutional: Patient appears well-developed and well-nourished. Obese  No distress.   HEENT: head atraumatic, normocephalic, pupils equal and reactive to light, neck supple Cardiovascular: Normal rate, regular rhythm and normal heart sounds.  No murmur heard. No BLE edema. Pulmonary/Chest: Effort normal and breath sounds normal. No respiratory distress. Abdominal: Soft.  There is no tenderness. Psychiatric: Patient has a normal mood and affect. behavior is normal. Judgment and thought content normal.      [name]:   Problem List Items Addressed This Visit       Respiratory   Moderate persistent asthma   Relevant Medications   Albuterol-Budesonide (AIRSUPRA) 90-80 MCG/ACT AERO     Other   Anxiety   Bipolar 2 disorder (HCC) - Primary     Assessment and Rich    Depression and Anxiety Positive depression and anxiety screening. Patient reports significant mental health deterioration, including daily vomiting during periods of high stress. Current medications include Xanax 0.25mg  as needed, dextroamphetamine 10mg  twice daily, Lamictal 200mg  at bedtime, Lurasidone 60mg  daily, propranolol 60mg  twice daily, Seroquel 25mg  daily. Patient expresses concern that current medication regimen is not effective and is considering the possibility of an autism diagnosis. -Continue current medication regimen. -Encourage patient to discuss concerns and potential autism diagnosis with psychiatrist at next appointment on January 3rd. -Provide patient with Autism Spectrum Quotient test to complete at home and discuss results with psychiatrist.  Weight Loss Patient reports weight loss from 203 lbs in June to 191 lbs currently. Likely related to dextroamphetamine use and mental health stressors. -Monitor weight closely. -Continue supportive measures, including Zofran for vomiting.  Asthma Patient reports not using prescribed Air Supra inhaler. -Encourage patient to resume use of Air Supra inhaler as prescribed. -Continue albuterol inhaler and nebulizer as needed for acute  symptoms.  Follow-up Patient to send message with Autism Spectrum Quotient test results and update on psychiatrist appointment.        [name]: Return if symptoms worsen or fail to improve.

## 2023-10-20 DIAGNOSIS — Z419 Encounter for procedure for purposes other than remedying health state, unspecified: Secondary | ICD-10-CM | POA: Diagnosis not present

## 2023-10-22 DIAGNOSIS — F900 Attention-deficit hyperactivity disorder, predominantly inattentive type: Secondary | ICD-10-CM | POA: Diagnosis not present

## 2023-10-22 DIAGNOSIS — F3181 Bipolar II disorder: Secondary | ICD-10-CM | POA: Diagnosis not present

## 2023-10-22 DIAGNOSIS — F411 Generalized anxiety disorder: Secondary | ICD-10-CM | POA: Diagnosis not present

## 2023-10-24 DIAGNOSIS — F330 Major depressive disorder, recurrent, mild: Secondary | ICD-10-CM | POA: Diagnosis not present

## 2023-10-24 DIAGNOSIS — F411 Generalized anxiety disorder: Secondary | ICD-10-CM | POA: Diagnosis not present

## 2023-10-31 DIAGNOSIS — Z419 Encounter for procedure for purposes other than remedying health state, unspecified: Secondary | ICD-10-CM | POA: Diagnosis not present

## 2023-11-02 ENCOUNTER — Ambulatory Visit
Admission: EM | Admit: 2023-11-02 | Discharge: 2023-11-02 | Disposition: A | Discharge status: Home / Self Care | Payer: Medicaid Other

## 2023-11-02 DIAGNOSIS — W5503XA Scratched by cat, initial encounter: Secondary | ICD-10-CM

## 2023-11-02 DIAGNOSIS — W5501XA Bitten by cat, initial encounter: Secondary | ICD-10-CM

## 2023-11-02 DIAGNOSIS — Z203 Contact with and (suspected) exposure to rabies: Secondary | ICD-10-CM | POA: Diagnosis not present

## 2023-11-02 MED ORDER — RABIES IMMUNE GLOBULIN 150 UNIT/ML IM INJ
20.0000 [IU]/kg | INJECTION | Freq: Once | INTRAMUSCULAR | Status: AC
  Administered 2023-11-02: 1800 [IU]

## 2023-11-02 MED ORDER — RABIES VACCINE, PCEC IM SUSR
1.0000 mL | Freq: Once | INTRAMUSCULAR | Status: AC
  Administered 2023-11-02: 1 mL via INTRAMUSCULAR

## 2023-11-02 MED ORDER — MOXIFLOXACIN HCL 400 MG PO TABS: 400.0000 mg | ORAL_TABLET | Freq: Every day | ORAL | 0 refills | Status: AC

## 2023-11-02 NOTE — ED Triage Notes (Signed)
"  I have cat bite/scratch on my right hand with swelling and not able to close my hand fully now". DOI: 11-01-2023 around 8pm. Stray cat (unknown vaccines). Tdap UTD (per patient). No fever.

## 2023-11-02 NOTE — Discharge Instructions (Addendum)
 Take antibiotics as prescribed,  Return on 11/05/23 for 2nd rabies Return on 11/09/23 for 3rd rabies Return on 11/16/23 for 4th rabies injection  If you are unable to to make a fist, have increase in swelling, worsening symptoms, go to emergency room for further evaluation by hand specialist.

## 2023-11-02 NOTE — ED Provider Notes (Signed)
 [facility]    CSN: [id number] Arrival date & time: 11/02/23  1029      History   Chief Complaint Chief Complaint  Patient presents with   Animal Bite    HPI Janet Rich is a 28 y.o. female.   28 year old female, Janet Rich, presents to urgent care for evaluation of right hand injury after having cat bite /scratch from cat bite/scratch 11/01/2023 8 PM, stray cat, patient's last tetanus was 3 years prior.  Patient has no fever, is right-hand dominant  The history is provided by the patient. No language interpreter was used.    Past Medical History:  Diagnosis Date   Anemia    with periods   Ankle fracture    right   Anxiety    Asthma    Back pain    Bipolar 1 disorder (HCC)    Depression    Bipolar    Endometriosis    Fibromyalgia    GERD (gastroesophageal reflux disease)    Headache    MIGRAINES   Hyperlipidemia    IBS (irritable bowel syndrome)    Ovarian cyst    PCOS (polycystic ovarian syndrome)    Pneumonia    age 54   PONV (postoperative nausea and vomiting)    nausea after wisdom teeth extraction   Scoliosis    Vaginal Pap smear, abnormal    bx ok    Patient Active Problem List   Diagnosis Date Noted   Need for post exposure prophylaxis for rabies 11/02/2023   Cat bite 11/02/2023   Cat scratch 11/02/2023   Mixed hyperlipidemia 07/12/2023   Migraine without status migrainosus, not intractable 07/12/2023   Attention deficit hyperactivity disorder (ADHD), combined type 01/08/2023   Chronic migraine w/o aura w/o status migrainosus, not intractable 07/30/2022   Bulging of lumbar intervertebral disc 06/16/2022   Fibromyalgia 03/09/2022   Chronic back pain 03/09/2022   Excessive daytime sleepiness 11/28/2021   Endometriosis determined by laparoscopy 05/08/2021   Myalgia 03/11/2021   Elevated BP without diagnosis of hypertension 01/08/2021   Dichorionic diamniotic twin pregnancy in third trimester 08/14/2020   Preeclampsia  08/14/2020   Twin pregnancy delivered vaginally 08/14/2020   Preterm Rich 06/17/2020   Right trigeminal neuralgia 11/08/2019   Endometriosis 03/27/2019   PCOS (polycystic ovarian syndrome) 03/27/2019   Moderate persistent asthma 03/07/2019   Post-nasal drainage 03/07/2019   Irritable bowel syndrome with diarrhea 03/07/2019   Gastroesophageal reflux disease without esophagitis 03/07/2019   Class 1 obesity due to excess calories with serious comorbidity and body mass index (BMI) of 34.0 to 34.9 in adult 09/02/2018   Bipolar 2 disorder (HCC) 08/12/2018   History of low birth weight 03/11/2018   Vulvar atrophy 03/11/2018   History of migraine headaches 05/01/2015   Anxiety 05/01/2015    Past Surgical History:  Procedure Laterality Date   ABDOMINAL HYSTERECTOMY     FRACTURE SURGERY     ankle surgery with impant    HYSTEROSCOPY WITH D & C N/A 09/22/2019   Procedure: DILATATION AND CURETTAGE /HYSTEROSCOPY;  Surgeon: Ward, Mitzie BROCKS, MD;  Location: [facility] ORS;  Service: Gynecology;  Laterality: N/A;   LYSIS OF ADHESION N/A 05/08/2021   Procedure: LYSIS OF ADHESION, EXCISION OF ENDOMETRIOSIS;  Surgeon: Janet Keen, MD;  Location: [facility] ORS;  Service: Gynecology;  Laterality: N/A;   ROBOTIC ASSISTED DIAGNOSTIC LAPAROSCOPY N/A 02/18/2017   Procedure: ROBOTIC ASSISTED DIAGNOSTIC LAPAROSCOPY,EXCISION AND ABLATION OF ENDOMETRIOSIS,LYSIS OF ADHESIONS;  Surgeon: Janet Flowers, MD;  Location: [facility]  ORS;  Service: Gynecology;  Laterality: N/A;   ROBOTIC ASSISTED LAPAROSCOPIC HYSTERECTOMY AND SALPINGECTOMY Bilateral 05/08/2021   Procedure: XI ROBOTIC ASSISTED TOTAL LAPAROSCOPIC HYSTERECTOMY AND SALPINGECTOMY;  Surgeon: Janet Keen, MD;  Location: [facility] ORS;  Service: Gynecology;  Laterality: Bilateral;   VAGINAL DELIVERY N/A 08/14/2020   Procedure: VAGINAL DELIVERY;  Surgeon: Janet Debby PARAS, MD;  Location: [facility] ORS;  Service: Obstetrics;  Laterality: N/A;   WISDOM TOOTH EXTRACTION  2013     OB History     Gravida  2   Para  2   Term  1   Preterm  1   AB  0   Living  3      SAB  0   IAB  0   Ectopic  0   Multiple  1   Live Births  3            Home Medications    Prior to Admission medications   Medication Sig Start Date End Date Taking? Authorizing Provider  ALPRAZolam  (XANAX ) 0.5 MG tablet Take 0.5 mg by mouth 2 (two) times daily as needed. 06/18/23  Yes [provider]  cyclobenzaprine  (FLEXERIL ) 10 MG tablet Take 10 mg by mouth at bedtime. 08/08/23  Yes [provider]  dextroamphetamine (DEXTROSTAT) 10 MG tablet Take 10 mg by mouth 2 (two) times daily. 06/18/23  Yes [provider]  gabapentin  (NEURONTIN ) 100 MG capsule Take 4 capsules (400 mg total) by mouth at bedtime. 08/16/23  Yes Janet Mliss FALCON, FNP  lamoTRIgine  (LAMICTAL ) 200 MG tablet Take 100 mg by mouth 2 (two) times daily. 11/22/21  Yes [provider]  Lurasidone  HCl 60 MG TABS Take 60 mg by mouth every evening. 03/24/23  Yes [provider]  moxifloxacin  (AVELOX ) 400 MG tablet Take 1 tablet (400 mg total) by mouth daily at 8 pm for 7 days. 11/02/23 11/09/23 Yes Janet Fentress, NP  QUEtiapine (SEROQUEL) 25 MG tablet Take 25 mg by mouth at bedtime.   Yes [provider]  acetaminophen  (TYLENOL ) 500 MG tablet Take 2 tablets (1,000 mg total) by mouth every 6 (six) hours as needed for moderate pain. 07/11/20   Janet Rich, CNM  albuterol  (PROVENTIL ) (2.5 MG/3ML) 0.083% nebulizer solution Take 3 mLs (2.5 mg total) by nebulization every 6 (six) hours as needed for wheezing or shortness of breath. 01/08/23   Janet Mliss FALCON, FNP  albuterol  (VENTOLIN  HFA) 108 (90 Base) MCG/ACT inhaler Inhale 1-2 puffs into the lungs every 6 (six) hours as needed for wheezing or shortness of breath. 01/08/23   Janet Mliss FALCON, FNP  Albuterol -Budesonide  (AIRSUPRA ) 90-80 MCG/ACT AERO Inhale 1-2 puffs into the lungs as directed. 10/04/23   Pender, Julie F, FNP   ALPRAZolam  (XANAX ) 0.25 MG tablet Take 0.25 mg by mouth at bedtime as needed for anxiety.    [provider]  Ascorbic Acid (VITAMIN C) 100 MG tablet Take 100 mg by mouth daily.    [provider]  cholecalciferol (VITAMIN D3) 25 MCG (1000 UNIT) tablet Take 1,000 Units by mouth daily.    [provider]  COMIRNATY syringe Inject 0.3 mLs into the muscle once. 07/11/23   [provider]  Dextroamphetamine Sulfate 20 MG TABS Take 20 mg by mouth as directed. 10/25/23   [provider]  famotidine  (PEPCID ) 20 MG tablet Take 1 tablet (20 mg total) by mouth 2 (two) times daily. 01/08/23   Janet Mliss FALCON, FNP  ibuprofen  (ADVIL ) 800 MG tablet Take 1  tablet (800 mg total) by mouth every 8 (eight) hours as needed. 11/10/21   Pender, Julie F, FNP  MAGNESIUM  MALATE PO Take 1 tablet by mouth daily at 6 (six) AM.    [provider]  Multiple Vitamin (MULTIVITAMIN ADULT PO) Take 1 tablet by mouth daily.    [provider]  omeprazole  (PRILOSEC) 40 MG capsule Take 1 capsule (40 mg total) by mouth 2 (two) times daily before a meal. 05/11/23 07/10/23  Vanga, Corinn Skiff, MD  ondansetron  (ZOFRAN -ODT) 4 MG disintegrating tablet Take 1 tablet (4 mg total) by mouth every 8 (eight) hours as needed for nausea or vomiting. 09/29/23   Janet Mliss FALCON, FNP  propranolol  (INDERAL ) 60 MG tablet Take 60 mg by mouth 2 (two) times daily. 09/27/23   [provider]  topiramate  (TOPAMAX ) 25 MG tablet Take 1 tablet at bedtime x 1 week, then take 2 tablets at bedtime 01/19/23   Gayland Lauraine PARAS, NP  vitamin B-12 (CYANOCOBALAMIN ) 500 MCG tablet Take 500 mcg by mouth daily.    [provider]    Family History Family History  Problem Relation Age of Onset   Endometriosis Mother    Bipolar disorder Mother    Diabetes Mother        prediabetes   Schizophrenia Father    Depression Father    Breast cancer Paternal Grandmother    Cancer Paternal Grandmother     Depression Paternal Grandmother    Hypertension Other    Hyperlipidemia Other    Asthma Other    Heart disease Neg Hx    Colon cancer Neg Hx    Ovarian cancer Neg Hx     Social History Social History   Tobacco Use   Smoking status: Former    Current packs/day: 0.00    Types: Cigarettes    Start date: 12/20/2008    Quit date: 12/20/2009    Years since quitting: 13.8   Smokeless tobacco: Never   Tobacco comments:    age [age], quit, 1 cig daily  Vaping Use   Vaping status: Never Used  Substance Use Topics   Alcohol use: Yes    Comment: Randomly   Drug use: Never     Allergies   Amoxicillin , Other, and Latex   Review of Systems Review of Systems  Constitutional:  Negative for fever.  Skin:  Positive for wound.  All other systems reviewed and are negative.    Physical Exam Triage Vital Signs ED Triage Vitals  Encounter Vitals Group     BP 11/02/23 1132 112/78     Systolic BP Percentile --      Diastolic BP Percentile --      Pulse Rate 11/02/23 1132 81     Resp 11/02/23 1132 18     Temp 11/02/23 1132 98.4 Rich (36.9 C)     Temp Source 11/02/23 1132 Oral     SpO2 11/02/23 1132 99 %     Weight 11/02/23 1126 195 lb (88.5 kg)     Height 11/02/23 1126 5' 1 (1.549 m)     Head Circumference --      Peak Flow --      Pain Score 11/02/23 1124 3     Pain Loc --      Pain Education --      Exclude from Growth Chart --    No data found.  Updated Vital Signs BP 112/78 (BP Location: Left Arm)   Pulse 81   Temp 98.4  Rich (36.9 C) (Oral)   Resp 18   Ht 5' 1 (1.549 m)   Wt 195 lb (88.5 kg)   LMP 04/08/2021   SpO2 99%   BMI 36.84 kg/m   Visual Acuity Right Eye Distance:   Left Eye Distance:   Bilateral Distance:    Right Eye Near:   Left Eye Near:    Bilateral Near:     Physical Exam Vitals and nursing note reviewed.  Constitutional:      General: She is not in acute distress.    Appearance: She is well-developed and well-groomed.  HENT:     Head:  Normocephalic and atraumatic.  Eyes:     Conjunctiva/sclera: Conjunctivae normal.  Cardiovascular:     Rate and Rhythm: Normal rate and regular rhythm.     Pulses:          Radial pulses are 2+ on the right side.     Heart sounds: No murmur heard. Pulmonary:     Effort: Pulmonary effort is normal. No respiratory distress.     Breath sounds: Normal breath sounds and air entry.  Abdominal:     Palpations: Abdomen is soft.     Tenderness: There is no abdominal tenderness.  Musculoskeletal:        General: No swelling.     Cervical back: Neck supple.  Skin:    General: Skin is warm and dry.     Capillary Refill: Capillary refill takes less than 2 seconds.     Findings: Abrasion and wound present. No ecchymosis.     Comments: Patient has numerous scratches and abrasions to right hand(dorsal and palmar aspect)  Neurological:     General: No focal deficit present.     Mental Status: She is alert and oriented to person, place, and time.     GCS: GCS eye subscore is 4. GCS verbal subscore is 5. GCS motor subscore is 6.     Cranial Nerves: No cranial nerve deficit.     Sensory: No sensory deficit.  Psychiatric:        Attention and Perception: Attention normal.        Mood and Affect: Mood normal.        Speech: Speech normal.        Behavior: Behavior is cooperative.      UC Treatments / Results  Labs (all labs ordered are listed, but only abnormal results are displayed) Labs Reviewed - No data to display  EKG   Radiology No results found.  Procedures Procedures (including critical care time)  Medications Ordered in UC Medications  rabies immune globulin  (HYPERRAB/KEDRAB) injection 1,800 Units (1,800 Units Infiltration Given 11/02/23 1229)  rabies vaccine  (RABAVERT ) injection 1 mL (1 mL Intramuscular Given 11/02/23 1229)    Initial Impression / Assessment and Plan / UC Course  I have reviewed the triage vital signs and the nursing notes.  Pertinent labs & imaging  results that were available during my care of the patient were reviewed by me and considered in my medical decision making (see chart for details).    Discussed exam findings and plan of care with patient, avelox  prescribed as pt has PCN allergy, strict go to ER precautions given.   Patient verbalized understanding to this provider.  Ddx: Cat bite,cat scratch,rabies exposure Final Clinical Impressions(s) / UC Diagnoses   Final diagnoses:  Cat bite, initial encounter  Need for post exposure prophylaxis for rabies  Cat scratch     Discharge Instructions  Take antibiotics as prescribed,  Return on 11/05/23 for 2nd rabies Return on 11/09/23 for 3rd rabies Return on 11/16/23 for 4th rabies injection  If you are unable to to make a fist, have increase in swelling, worsening symptoms, go to emergency room for further evaluation by hand specialist.     ED Prescriptions     Medication Sig Dispense Auth. Provider   moxifloxacin  (AVELOX ) 400 MG tablet Take 1 tablet (400 mg total) by mouth daily at 8 pm for 7 days. 7 tablet Dashiell Franchino, Rilla, NP      PDMP not reviewed this encounter.   Aminta Rilla, NP 11/02/23 1254

## 2023-11-05 ENCOUNTER — Ambulatory Visit
Admission: RE | Admit: 2023-11-05 | Discharge: 2023-11-05 | Disposition: A | Discharge status: Home / Self Care | Payer: Medicaid Other | Source: Ambulatory Visit | Attending: Internal Medicine | Admitting: Internal Medicine

## 2023-11-05 DIAGNOSIS — Z203 Contact with and (suspected) exposure to rabies: Secondary | ICD-10-CM

## 2023-11-05 MED ORDER — RABIES VACCINE, PCEC IM SUSR
1.0000 mL | Freq: Once | INTRAMUSCULAR | Status: AC
  Administered 2023-11-05: 1 mL via INTRAMUSCULAR

## 2023-11-05 NOTE — ED Triage Notes (Signed)
Pt returning for 2nd rabies vaccine on correct date, 11/04/22.

## 2023-11-08 ENCOUNTER — Ambulatory Visit: Payer: Self-pay

## 2023-11-09 ENCOUNTER — Other Ambulatory Visit: Payer: Self-pay

## 2023-11-09 ENCOUNTER — Ambulatory Visit
Admission: RE | Admit: 2023-11-09 | Discharge: 2023-11-09 | Disposition: A | Discharge status: Home / Self Care | Payer: Medicaid Other | Source: Ambulatory Visit | Attending: Family Medicine | Admitting: Family Medicine

## 2023-11-09 DIAGNOSIS — Z203 Contact with and (suspected) exposure to rabies: Secondary | ICD-10-CM

## 2023-11-09 MED ORDER — RABIES VACCINE, PCEC IM SUSR
1.0000 mL | Freq: Once | INTRAMUSCULAR | Status: AC
  Administered 2023-11-09: 1 mL via INTRAMUSCULAR

## 2023-11-09 NOTE — ED Triage Notes (Signed)
Pt here for 3rd rabies vaccine; given left deltoid

## 2023-11-10 DIAGNOSIS — F411 Generalized anxiety disorder: Secondary | ICD-10-CM | POA: Diagnosis not present

## 2023-11-10 DIAGNOSIS — F330 Major depressive disorder, recurrent, mild: Secondary | ICD-10-CM | POA: Diagnosis not present

## 2023-11-12 DIAGNOSIS — F411 Generalized anxiety disorder: Secondary | ICD-10-CM | POA: Diagnosis not present

## 2023-11-12 DIAGNOSIS — F3181 Bipolar II disorder: Secondary | ICD-10-CM | POA: Diagnosis not present

## 2023-11-12 DIAGNOSIS — F902 Attention-deficit hyperactivity disorder, combined type: Secondary | ICD-10-CM | POA: Diagnosis not present

## 2023-11-12 DIAGNOSIS — F900 Attention-deficit hyperactivity disorder, predominantly inattentive type: Secondary | ICD-10-CM | POA: Diagnosis not present

## 2023-11-16 ENCOUNTER — Other Ambulatory Visit: Payer: Self-pay

## 2023-11-16 ENCOUNTER — Encounter: Payer: Self-pay | Admitting: Emergency Medicine

## 2023-11-16 ENCOUNTER — Ambulatory Visit
Admission: EM | Admit: 2023-11-16 | Discharge: 2023-11-16 | Disposition: A | Discharge status: Home / Self Care | Payer: Medicaid Other | Attending: Physician Assistant | Admitting: Physician Assistant

## 2023-11-16 DIAGNOSIS — Z203 Contact with and (suspected) exposure to rabies: Secondary | ICD-10-CM | POA: Diagnosis not present

## 2023-11-16 MED ORDER — RABIES VACCINE, PCEC IM SUSR
1.0000 mL | Freq: Once | INTRAMUSCULAR | Status: AC
  Administered 2023-11-16: 1 mL via INTRAMUSCULAR

## 2023-11-16 NOTE — ED Triage Notes (Signed)
Pt here for 4th rabies vaccines; given left arm

## 2023-11-17 ENCOUNTER — Ambulatory Visit: Payer: Self-pay

## 2023-11-17 DIAGNOSIS — F411 Generalized anxiety disorder: Secondary | ICD-10-CM | POA: Diagnosis not present

## 2023-11-17 DIAGNOSIS — F330 Major depressive disorder, recurrent, mild: Secondary | ICD-10-CM | POA: Diagnosis not present

## 2023-11-20 DIAGNOSIS — Z419 Encounter for procedure for purposes other than remedying health state, unspecified: Secondary | ICD-10-CM | POA: Diagnosis not present

## 2023-11-24 DIAGNOSIS — F411 Generalized anxiety disorder: Secondary | ICD-10-CM | POA: Diagnosis not present

## 2023-11-24 DIAGNOSIS — F330 Major depressive disorder, recurrent, mild: Secondary | ICD-10-CM | POA: Diagnosis not present

## 2023-12-01 DIAGNOSIS — F330 Major depressive disorder, recurrent, mild: Secondary | ICD-10-CM | POA: Diagnosis not present

## 2023-12-01 DIAGNOSIS — F411 Generalized anxiety disorder: Secondary | ICD-10-CM | POA: Diagnosis not present

## 2023-12-09 DIAGNOSIS — F411 Generalized anxiety disorder: Secondary | ICD-10-CM | POA: Diagnosis not present

## 2023-12-09 DIAGNOSIS — F330 Major depressive disorder, recurrent, mild: Secondary | ICD-10-CM | POA: Diagnosis not present

## 2023-12-13 ENCOUNTER — Telehealth: Payer: Self-pay | Admitting: Neurology

## 2023-12-13 ENCOUNTER — Ambulatory Visit
Admission: EM | Admit: 2023-12-13 | Discharge: 2023-12-13 | Disposition: A | Discharge status: Home / Self Care | Payer: Medicaid Other | Attending: Family Medicine | Admitting: Family Medicine

## 2023-12-13 DIAGNOSIS — G43819 Other migraine, intractable, without status migrainosus: Secondary | ICD-10-CM

## 2023-12-13 MED ORDER — DEXAMETHASONE SODIUM PHOSPHATE 10 MG/ML IJ SOLN
10.0000 mg | Freq: Once | INTRAMUSCULAR | Status: AC
  Administered 2023-12-13: 10 mg via INTRAMUSCULAR

## 2023-12-13 MED ORDER — ONDANSETRON HCL 4 MG/2ML IJ SOLN
4.0000 mg | Freq: Once | INTRAMUSCULAR | Status: AC
  Administered 2023-12-13: 4 mg via INTRAMUSCULAR

## 2023-12-13 MED ORDER — KETOROLAC TROMETHAMINE 30 MG/ML IJ SOLN
30.0000 mg | Freq: Once | INTRAMUSCULAR | Status: AC
  Administered 2023-12-13: 30 mg via INTRAMUSCULAR

## 2023-12-13 NOTE — Telephone Encounter (Signed)
 Patient called clinic concerned with possible drug interaction with torodol injection received at [facility]. She states that she received a migraine cocktail and was instructed to avoid ibuprofen for 24 hrs. She reports taking a dose of Excedrin and concerned of possible side effects. Consulted with provider who instructs her to monitor symptoms and if she has any concerning symptoms she needs to be evaluated in the ED. Instructed patient to follow-up in the ED or UC if she has a severe HA, kidney pain, abdominal pain, or n/v. Voiced understanding.

## 2023-12-13 NOTE — ED Triage Notes (Signed)
"  This ha started about 5 days ago, some nausea and vomiting with it, last emesis last night around 4am". "Some light sensitvity with this one".

## 2023-12-13 NOTE — Discharge Instructions (Signed)
 You were given a Toradol injection in clinic today. Do not take any over the counter NSAID's such as Advil, ibuprofen, Aleve, or naproxen for 24 hours. You may take tylenol if needed.  Please follow-up with your neurologist for further treatment options of your migraines.  Please go to the ER if you develop any worsening symptoms.  Hope you feel better soon!

## 2023-12-13 NOTE — ED Provider Notes (Signed)
 [facility]    CSN: [id number] Arrival date & time: 12/13/23  0931      History   Chief Complaint Chief Complaint  Patient presents with   Headache    HPI Janet Rich is a 28 y.o. female presents for migraine.  Patient has a history of migraines and reports onset of a unilateral headache that began 5 days ago.  States intensity is waxing and waning anywhere from 3 out of 10 to a 7 out of 10.  Reports currently it is 7 out of 10 and denies worst headache of life.  Denies auras with her migraines.  Does endorse dizziness, nausea/vomiting, photophobia, autophobia.  Denies syncope, visual changes, neck pain, fevers or chills.  She was on Topamax daily but ran out and has not yet seen her neurologist.  States it was not helping her much anyway.  She also has as needed Inderal as well.  She has taken Excedrin and ibuprofen for symptoms.  Last dose of ibuprofen was greater than 24 hours ago.  No other concerns at this time.   Headache Associated symptoms: dizziness, nausea, photophobia and vomiting     Past Medical History:  Diagnosis Date   Anemia    with periods   Ankle fracture    right   Anxiety    Asthma    Back pain    Bipolar 1 disorder (HCC)    Depression    Bipolar    Endometriosis    Fibromyalgia    GERD (gastroesophageal reflux disease)    Headache    MIGRAINES   Hyperlipidemia    IBS (irritable bowel syndrome)    Ovarian cyst    PCOS (polycystic ovarian syndrome)    Pneumonia    age 48   PONV (postoperative nausea and vomiting)    nausea after wisdom teeth extraction   Scoliosis    Vaginal Pap smear, abnormal    bx ok    Patient Active Problem List   Diagnosis Date Noted   Need for post exposure prophylaxis for rabies 11/02/2023   Cat bite 11/02/2023   Cat scratch 11/02/2023   Mixed hyperlipidemia 07/12/2023   Migraine without status migrainosus, not intractable 07/12/2023   Attention deficit hyperactivity disorder (ADHD), combined type  01/08/2023   Chronic migraine w/o aura w/o status migrainosus, not intractable 07/30/2022   Bulging of lumbar intervertebral disc 06/16/2022   Fibromyalgia 03/09/2022   Chronic back pain 03/09/2022   Excessive daytime sleepiness 11/28/2021   Endometriosis determined by laparoscopy 05/08/2021   Myalgia 03/11/2021   Elevated BP without diagnosis of hypertension 01/08/2021   Dichorionic diamniotic twin pregnancy in third trimester 08/14/2020   Preeclampsia 08/14/2020   Twin pregnancy delivered vaginally 08/14/2020   Preterm labor 06/17/2020   Right trigeminal neuralgia 11/08/2019   Endometriosis 03/27/2019   PCOS (polycystic ovarian syndrome) 03/27/2019   Moderate persistent asthma 03/07/2019   Post-nasal drainage 03/07/2019   Irritable bowel syndrome with diarrhea 03/07/2019   Gastroesophageal reflux disease without esophagitis 03/07/2019   Class 1 obesity due to excess calories with serious comorbidity and body mass index (BMI) of 34.0 to 34.9 in adult 09/02/2018   Bipolar 2 disorder (HCC) 08/12/2018   History of low birth weight 03/11/2018   Vulvar atrophy 03/11/2018   History of migraine headaches 05/01/2015   Anxiety 05/01/2015    Past Surgical History:  Procedure Laterality Date   ABDOMINAL HYSTERECTOMY     FRACTURE SURGERY     ankle surgery with impant  HYSTEROSCOPY WITH D & C N/A 09/22/2019   Procedure: DILATATION AND CURETTAGE /HYSTEROSCOPY;  Surgeon: Ward, Elenora Fender, MD;  Location: [facility];  Service: Gynecology;  Laterality: N/A;   LYSIS OF ADHESION N/A 05/08/2021   Procedure: LYSIS OF ADHESION, EXCISION OF ENDOMETRIOSIS;  Surgeon: Christeen Douglas, MD;  Location: [facility];  Service: Gynecology;  Laterality: N/A;   ROBOTIC ASSISTED DIAGNOSTIC LAPAROSCOPY N/A 02/18/2017   Procedure: ROBOTIC ASSISTED DIAGNOSTIC LAPAROSCOPY,EXCISION AND ABLATION OF ENDOMETRIOSIS,LYSIS OF ADHESIONS;  Surgeon: Olivia Mackie, MD;  Location: [facility];  Service: Gynecology;  Laterality: N/A;    ROBOTIC ASSISTED LAPAROSCOPIC HYSTERECTOMY AND SALPINGECTOMY Bilateral 05/08/2021   Procedure: XI ROBOTIC ASSISTED TOTAL LAPAROSCOPIC HYSTERECTOMY AND SALPINGECTOMY;  Surgeon: Christeen Douglas, MD;  Location: [facility];  Service: Gynecology;  Laterality: Bilateral;   VAGINAL DELIVERY N/A 08/14/2020   Procedure: VAGINAL DELIVERY;  Surgeon: Feliberto Gottron Ihor Austin, MD;  Location: [facility];  Service: Obstetrics;  Laterality: N/A;   WISDOM TOOTH EXTRACTION  2013    OB History     Gravida  2   Para  2   Term  1   Preterm  1   AB  0   Living  3      SAB  0   IAB  0   Ectopic  0   Multiple  1   Live Births  3            Home Medications    Prior to Admission medications   Medication Sig Start Date End Date Taking? Authorizing Provider  ALPRAZolam Prudy Feeler) 0.5 MG tablet Take 0.5 mg by mouth 2 (two) times daily as needed. 06/18/23  Yes [provider]  aspirin-acetaminophen-caffeine (EXCEDRIN MIGRAINE) [id number] MG tablet Take by mouth every 6 (six) hours as needed for headache.   Yes [provider]  cyclobenzaprine (FLEXERIL) 10 MG tablet Take 10 mg by mouth at bedtime. 08/08/23  Yes [provider]  Dextroamphetamine Sulfate 20 MG TABS Take 40 mg by mouth as directed. 10/25/23  Yes [provider]  ibuprofen (ADVIL) 800 MG tablet Take 1 tablet (800 mg total) by mouth every 8 (eight) hours as needed. 11/10/21  Yes Berniece Salines, FNP  lamoTRIgine (LAMICTAL) 200 MG tablet Take 100 mg by mouth 2 (two) times daily. 11/22/21  Yes [provider]  Lurasidone HCl 60 MG TABS Take 60 mg by mouth every evening. 03/24/23  Yes [provider]  acetaminophen (TYLENOL) 500 MG tablet Take 2 tablets (1,000 mg total) by mouth every 6 (six) hours as needed for moderate pain. 07/11/20   McVey, Prudencio Pair, CNM  albuterol (PROVENTIL) (2.5 MG/3ML) 0.083% nebulizer solution Take 3 mLs (2.5 mg total) by nebulization every 6 (six) hours as needed for  wheezing or shortness of breath. 01/08/23   Berniece Salines, FNP  albuterol (VENTOLIN HFA) 108 (90 Base) MCG/ACT inhaler Inhale 1-2 puffs into the lungs every 6 (six) hours as needed for wheezing or shortness of breath. 01/08/23   Berniece Salines, FNP  Albuterol-Budesonide (AIRSUPRA) 90-80 MCG/ACT AERO Inhale 1-2 puffs into the lungs as directed. 10/04/23   Berniece Salines, FNP  ALPRAZolam Prudy Feeler) 0.25 MG tablet Take 0.25 mg by mouth at bedtime as needed for anxiety.    [provider]  Ascorbic Acid (VITAMIN C) 100 MG tablet Take 100 mg by mouth daily.    [provider]  cholecalciferol (VITAMIN D3) 25 MCG (1000 UNIT) tablet Take 1,000 Units by mouth daily.    [provider]  COMIRNATY syringe Inject 0.3 mLs into the muscle once. 07/11/23   [provider]  dextroamphetamine (DEXTROSTAT) 10 MG tablet Take 10 mg by mouth 2 (two) times daily. 06/18/23   [provider]  famotidine (PEPCID) 20 MG tablet Take 1 tablet (20 mg total) by mouth 2 (two) times daily. 01/08/23   Berniece Salines, FNP  gabapentin (NEURONTIN) 100 MG capsule Take 4 capsules (400 mg total) by mouth at bedtime. 08/16/23   Berniece Salines, FNP  MAGNESIUM MALATE PO Take 1 tablet by mouth daily at 6 (six) AM.    [provider]  Multiple Vitamin (MULTIVITAMIN ADULT PO) Take 1 tablet by mouth daily.    [provider]  omeprazole (PRILOSEC) 40 MG capsule Take 1 capsule (40 mg total) by mouth 2 (two) times daily before a meal. 05/11/23 07/10/23  Vanga, Loel Dubonnet, MD  ondansetron (ZOFRAN-ODT) 4 MG disintegrating tablet Take 1 tablet (4 mg total) by mouth every 8 (eight) hours as needed for nausea or vomiting. 09/29/23   Berniece Salines, FNP  propranolol (INDERAL) 60 MG tablet Take 60 mg by mouth 2 (two) times daily. 09/27/23   [provider]  QUEtiapine (SEROQUEL) 25 MG tablet Take 25 mg by mouth at bedtime.    [provider]  topiramate (TOPAMAX) 25 MG tablet  Take 1 tablet at bedtime x 1 week, then take 2 tablets at bedtime 01/19/23   Glean Salvo, NP  vitamin B-12 (CYANOCOBALAMIN) 500 MCG tablet Take 500 mcg by mouth daily.    [provider]    Family History Family History  Problem Relation Age of Onset   Endometriosis Mother    Bipolar disorder Mother    Diabetes Mother        prediabetes   Schizophrenia Father    Depression Father    Breast cancer Paternal Grandmother    Cancer Paternal Grandmother    Depression Paternal Grandmother    Hypertension Other    Hyperlipidemia Other    Asthma Other    Heart disease Neg Hx    Colon cancer Neg Hx    Ovarian cancer Neg Hx     Social History Social History   Tobacco Use   Smoking status: Former    Current packs/day: 0.00    Types: Cigarettes    Start date: 12/20/2008    Quit date: 12/20/2009    Years since quitting: 13.9   Smokeless tobacco: Never   Tobacco comments:    age [age], quit, 1 cig daily  Vaping Use   Vaping status: Never Used  Substance Use Topics   Alcohol use: Yes    Comment: Randomly   Drug use: Never     Allergies   Amoxicillin, Other, and Latex   Review of Systems Review of Systems  Eyes:  Positive for photophobia.  Gastrointestinal:  Positive for nausea and vomiting.  Neurological:  Positive for dizziness and headaches.     Physical Exam Triage Vital Signs ED Triage Vitals  Encounter Vitals Group     BP 12/13/23 1059 127/77     Systolic BP Percentile --      Diastolic BP Percentile --      Pulse --      Resp 12/13/23 1059 18     Temp 12/13/23 1059 97.8 F (36.6 C)     Temp Source 12/13/23 1059 Oral     SpO2 12/13/23 1059 97 %     Weight 12/13/23 1054 188  lb (85.3 kg)     Height 12/13/23 1054 5\' 1"  (1.549 m)     Head Circumference --      Peak Flow --      Pain Score 12/13/23 1050 7     Pain Loc --      Pain Education --      Exclude from Growth Chart --    No data found.  Updated Vital Signs BP 127/77 (BP Location: Right  Arm)   Temp 97.8 F (36.6 C) (Oral)   Resp 18   Ht 5\' 1"  (1.549 m)   Wt 188 lb (85.3 kg)   LMP 04/08/2021   SpO2 97%   BMI 35.52 kg/m   Visual Acuity Right Eye Distance:   Left Eye Distance:   Bilateral Distance:    Right Eye Near:   Left Eye Near:    Bilateral Near:     Physical Exam Vitals and nursing note reviewed.  Constitutional:      General: She is not in acute distress.    Appearance: Normal appearance. She is not ill-appearing.  HENT:     Head: Normocephalic and atraumatic.  Eyes:     Extraocular Movements: Extraocular movements intact.     Conjunctiva/sclera: Conjunctivae normal.     Pupils: Pupils are equal, round, and reactive to light.  Cardiovascular:     Rate and Rhythm: Normal rate.  Pulmonary:     Effort: Pulmonary effort is normal.  Skin:    General: Skin is warm and dry.  Neurological:     General: No focal deficit present.     Mental Status: She is alert and oriented to person, place, and time.     GCS: GCS eye subscore is 4. GCS verbal subscore is 5. GCS motor subscore is 6.     Cranial Nerves: No facial asymmetry.     Motor: No weakness.     Coordination: Finger-Nose-Finger Test normal.     Comments: Strength 5 out of 5 bilateral upper extremities  Psychiatric:        Mood and Affect: Mood normal.        Behavior: Behavior normal.      UC Treatments / Results  Labs (all labs ordered are listed, but only abnormal results are displayed) Labs Reviewed - No data to display  EKG   Radiology No results found.  Procedures Procedures (including critical care time)  Medications Ordered in UC Medications  ketorolac (TORADOL) 30 MG/ML injection 30 mg (30 mg Intramuscular Given 12/13/23 1139)  dexamethasone (DECADRON) injection 10 mg (10 mg Intramuscular Given 12/13/23 1139)  ondansetron (ZOFRAN) injection 4 mg (4 mg Intramuscular Given 12/13/23 1139)    Initial Impression / Assessment and Plan / UC Course  I have reviewed the triage  vital signs and the nursing notes.  Pertinent labs & imaging results that were available during my care of the patient were reviewed by me and considered in my medical decision making (see chart for details).     Exam and symptoms with patient.  No red flags.  Patient presenting with persistent migraine and denies worst headache of life.  Patient given IM Toradol, Decadron and Zofran in clinic.  She was monitored for 10 minutes after injection with no reaction noted and tolerated well.  She was instructed no NSAIDs for 24 hours and verbalized understanding.  Advised to follow-up with a neurologist for further migraine treatment options.  ER precautions reviewed and patient verbalized understanding. Final Clinical Impressions(s) / UC  Diagnoses   Final diagnoses:  Other migraine without status migrainosus, intractable     Discharge Instructions      You were given a Toradol injection in clinic today. Do not take any over the counter NSAID's such as Advil, ibuprofen, Aleve, or naproxen for 24 hours. You may take tylenol if needed.  Please follow-up with your neurologist for further treatment options of your migraines.  Please go to the ER if you develop any worsening symptoms.  Hope you feel better soon!      ED Prescriptions   None    PDMP not reviewed this encounter.   Radford Pax, NP 12/13/23 [phone]

## 2023-12-13 NOTE — Telephone Encounter (Signed)
 Pt said on day 6 of a migraine , taking Ibuprofen,Excedrin Migraine.  Went to the Urgent Care at [facility] today and was given a Toradol injection,Decadron and Zofran  injection . No relief as of yet.

## 2023-12-14 NOTE — Telephone Encounter (Signed)
 Noted.

## 2023-12-14 NOTE — Progress Notes (Unsigned)
 Patient: Janet Rich Date of Birth: Mar 26, 1996  Reason for Visit: Follow up History from: Patient Primary Neurologist: Lucia Gaskins  ASSESSMENT AND PLAN 28 y.o. year old female   1.  Chronic migraine headache 2.  Bipolar disorder, anxiety, depression  -Daily headache, 8-10 migraine days a month  -Start Emgality with loading dose 240 mg injection, followed by 120 mg monthly injection for migraine prevention (has had hysterectomy) -Start Nurtec 75 mg tablet as needed for acute migraine treatment -Refill Zofran 4 mg ODT for nausea with migraine -Limit OTC medication to avoid rebound headache, ideally not treating headache more than 2 to 3 days a week -Recommend drink plenty of water, get adequate rest, healthy eating, adequate sleep -Tried and failed: Comprehensive list as below but includes Topamax, propranolol, Lamictal, Imitrex, Maxalt, antidepressants contraindicated due to suicidal ideation -Next steps: Nicolasa Ducking, Botox -Follow-up in 6 months or sooner if needed  Meds ordered this encounter  Medications   Galcanezumab-gnlm (EMGALITY) 120 MG/ML SOAJ    Sig: Inject 120 mg into the skin every 30 (thirty) days.    Dispense:  1.12 mL    Refill:  11   Galcanezumab-gnlm (EMGALITY) 120 MG/ML SOAJ    Sig: Inject 240 mg into the skin every 30 (thirty) days.    Dispense:  2 mL    Refill:  0    Please dispense to 2 injections for loading dose   Rimegepant Sulfate (NURTEC) 75 MG TBDP    Sig: Take 1 tablet (75 mg total) by mouth as needed.    Dispense:  8 tablet    Refill:  11   ondansetron (ZOFRAN-ODT) 4 MG disintegrating tablet    Sig: Take 1 tablet (4 mg total) by mouth every 8 (eight) hours as needed for nausea or vomiting.    Dispense:  20 tablet    Refill:  1   HISTORY OF PRESENT ILLNESS: Today 12/15/23 At last visit we started Topamax 50 mg at bedtime. Took it through October, then no showed her appointment and she ran out. It wasn't helping. Currently not on any  preventative, currently having 1-2 a month, can last a few days. Has migraine today that has lasted 8 days. Is starting to ease. Went to urgent care 2/24, given toradol, steroid, zofran injection. Didn't help much. Sleeping, resting is working best now. Has 3 kids, is busy, a lot of stress, is in school getting her bachelor's degree in english online. Has daily headache a month, 8-10 days a month of migraine. Her car is not working, her husband missed work to bring her. Currently migraine is bilateral retro orbital. Taking OTC Tylenol and Ibuprofen.   Copied Dr. Lucia Gaskins: From a thorough review of records: Medications tried that can be used in migraine management include: Tylenol, amitriptyline, aspirin, Flexeril, Decadron, Benadryl, diclofenac tablets, gabapentin, ibuprofen, Tylenol, Aleve, ketorolac injections, Lamictal, lidocaine injections, meclizine, Mobic tablets, methylprednisolone, Reglan tablets, Mobic, nifedipine (calcium channel blocker similar to verapamil), propranolol contraindicated due to asthma, Zofran, prednisone tablets, Compazine injections, Phenergan tablets and suppositories and injections, Seroquel, Zoloft, Imitrex, tramadol,worse with standing and changing positions.   01/19/23 SS: Migraines are doing awful, hasn't started Emgality, it was denied, we appealed but she didn't sign the letter, wanted to wait to see if her insurance changed. Not sure if her Medicaid would be continued, will know at the end of the month. Right now 2-3 migraines a week. Takes ibuprofen at night to go to sleep. Under a lot of stress. Finishing up her bachelor's  in literary studies then go into [employer]. Migraines are frontal, has n/v, sensitivity to light sound. Imitrex makes her sleepy, nauseated. Her PCP gave her a few phenergan tablets. She had hysterectomy.   HISTORY  July 30, 2022 SS: Here today for follow-up virtually.  Continues to struggle with frequent migraine headache.  Often with dizziness.   Reports daily headache, 2-3 migraines a week.  Tried prescription strength 800 mg ibuprofen, Excedrin Migraine.  In the remote past has taken Imitrex 25 mg with good effect, but made her sleepy.  Current migraines reports dizziniess, sensitivity to light, sound, nausea.  She is a stay-at-home mom, has a 80-year-old, set of almost  31-year-old twins.  Has been on few rounds of prednisone, would like to avoid if possible. In March 2023 MRI of the brain with and without contrast with IAC was normal.    12/05/21 Dr. Lucia Gaskins HPI:  Janet Rich is a 28 y.o. female here as requested by Berniece Salines, FNP for new chief complaint migraines with vertigo.  I saw patient in 2021 for different symptoms Right sided facial pain, she has a past medical history of PCOS, depression, headache, back pain, bipolar 2, anxiety and trigeminal neuralgia, myalgia, elevated blood pressure without diagnosis of hypertension, preeclampsia and preterm labor with twins, IBS, GERD, obesity BMI 38, migraines, anxiety and depression.  At the time I saw her she was having some shooting pain into the proximal jaw on and off.  At that time she told us that she had shooting pain that radiated in front of the ear did not really shoot in the face just kind of hurt in front of the ear brief and severe but then pain lingers, she saw an ENT ongoing for several years, sensitive to touch ongoing daily, at the time she was [redacted] weeks pregnant with twins and she reported decreased hearing on the right side.  Thorough neurologic and physical examination were normal.  We ordered an MRI of the brain and started a Medrol Dosepak at the time, we referred to ENT, patient did not follow-up with [place].   She was referred here by Dr. Zane Herald and I reviewed her notes from November 10, 2021, patient reported she has had vertigo for a week, migraines/headaches, especially in the morning, when she moves her head she gets dizzy, she had this in the past and was seen at ENT and  taught the Epley maneuvers, at that point she was referred to neurology and had an MRI as above, she was supposed to return for an MRI with contrast after the birth of her girls but she did not, she says she takes ibuprofen for migraines, it usually works for her, discussed that there are other treatments for migraine she may want to look into and she agreed to come to neurology.   Patient has a lot of dizziness, positional headaches and migraines, vision changes, can't bend over or move fast makes it worse, room is spinnng, worse in the morning when supine with headaches, she has not been performing her epley maneuvers. She is dizzy every day. Vertigo has been severe. She had vertigo In the past but nothing like this. She is having vision changes, recommended eye doctor (floaters), she has headaches, pulsating/pounding/throbbing with nausea and light sensitivity, her ear on the right is painful, started with an inner ear infection treated with antibiotics and ear drops. Also having daily chronic headaches and migraines for the last month but usually has a lot of migraines the  month. Prior to this only 3 days a month of migraines. She drinks 3 bottles water a day. No weakness. No other focal neurologic deficits, associated symptoms, inciting events or modifiable factors.   Reviewed notes, labs and imaging from outside physicians, which showed:   09/2021: BUN 10, creat 0.59   MRI brain 05/16/2020: MRI brain (without) demonstrating: personally reviewed imaging and agree - Tiny vessels noted near the right trigeminal nerve cisternal segment.  - Brain parenchyma is otherwise unremarkable.  Personally reviewed and agree.     From a thorough review of records: Medications tried that can be used in migraine management include: Tylenol, amitriptyline, aspirin, Flexeril, Decadron, Benadryl, diclofenac tablets, gabapentin, ibuprofen, Tylenol, Aleve, ketorolac injections, Lamictal, lidocaine injections, meclizine,  Mobic tablets, methylprednisolone, Reglan tablets, Mobic, nifedipine (calcium channel blocker similar to verapamil), propranolol contraindicated due to asthma, Zofran, prednisone tablets, Compazine injections, Phenergan tablets and suppositories and injections, Seroquel, Zoloft, Imitrex, tramadol,worse with standing and changing positions.  REVIEW OF SYSTEMS: Out of a complete 14 system review of symptoms, the patient complains only of the following symptoms, and all other reviewed systems are negative.  See HPI  ALLERGIES: Allergies  Allergen Reactions   Amoxicillin Shortness Of Breath and Nausea Only    Dizziness   Other     Must have anti-emetic prior to being given any narcotics    Latex Rash and Other (See Comments)    HOME MEDICATIONS: Outpatient Medications Prior to Visit  Medication Sig Dispense Refill   acetaminophen (TYLENOL) 500 MG tablet Take 2 tablets (1,000 mg total) by mouth every 6 (six) hours as needed for moderate pain. 30 tablet 0   albuterol (PROVENTIL) (2.5 MG/3ML) 0.083% nebulizer solution Take 3 mLs (2.5 mg total) by nebulization every 6 (six) hours as needed for wheezing or shortness of breath. 75 mL 12   albuterol (VENTOLIN HFA) 108 (90 Base) MCG/ACT inhaler Inhale 1-2 puffs into the lungs every 6 (six) hours as needed for wheezing or shortness of breath. 1 each 3   Albuterol-Budesonide (AIRSUPRA) 90-80 MCG/ACT AERO Inhale 1-2 puffs into the lungs as directed. 10.7 g 5   ALPRAZolam (XANAX) 0.25 MG tablet Take 0.25 mg by mouth at bedtime as needed for anxiety.     ALPRAZolam (XANAX) 0.5 MG tablet Take 0.5 mg by mouth 2 (two) times daily as needed.     Ascorbic Acid (VITAMIN C) 100 MG tablet Take 100 mg by mouth daily.     cholecalciferol (VITAMIN D3) 25 MCG (1000 UNIT) tablet Take 1,000 Units by mouth daily.     cyclobenzaprine (FLEXERIL) 10 MG tablet Take 10 mg by mouth at bedtime.     Dextroamphetamine Sulfate 20 MG TABS Take 40 mg by mouth as directed.      famotidine (PEPCID) 20 MG tablet Take 1 tablet (20 mg total) by mouth 2 (two) times daily. 180 tablet 1   gabapentin (NEURONTIN) 100 MG capsule Take 4 capsules (400 mg total) by mouth at bedtime. 140 capsule 4   ibuprofen (ADVIL) 800 MG tablet Take 1 tablet (800 mg total) by mouth every 8 (eight) hours as needed. 30 tablet 2   lamoTRIgine (LAMICTAL) 200 MG tablet Take 100 mg by mouth 2 (two) times daily.     Lurasidone HCl 60 MG TABS Take 60 mg by mouth every evening.     MAGNESIUM MALATE PO Take 1 tablet by mouth daily at 6 (six) AM.     Multiple Vitamin (MULTIVITAMIN ADULT PO) Take 1 tablet by mouth  daily.     ondansetron (ZOFRAN-ODT) 4 MG disintegrating tablet Take 1 tablet (4 mg total) by mouth every 8 (eight) hours as needed for nausea or vomiting. 30 tablet 0   QUEtiapine (SEROQUEL) 25 MG tablet Take 25 mg by mouth at bedtime.     vitamin B-12 (CYANOCOBALAMIN) 500 MCG tablet Take 500 mcg by mouth daily.     aspirin-acetaminophen-caffeine (EXCEDRIN MIGRAINE) 250-250-65 MG tablet Take by mouth every 6 (six) hours as needed for headache.     dextroamphetamine (DEXTROSTAT) 10 MG tablet Take 10 mg by mouth 2 (two) times daily.     propranolol (INDERAL) 60 MG tablet Take 60 mg by mouth 2 (two) times daily.     COMIRNATY syringe Inject 0.3 mLs into the muscle once. (Patient not taking: Reported on 12/15/2023)     omeprazole (PRILOSEC) 40 MG capsule Take 1 capsule (40 mg total) by mouth 2 (two) times daily before a meal. 60 capsule 1   topiramate (TOPAMAX) 25 MG tablet Take 1 tablet at bedtime x 1 week, then take 2 tablets at bedtime (Patient not taking: Reported on 12/15/2023) 60 tablet 5   No facility-administered medications prior to visit.    PAST MEDICAL HISTORY: Past Medical History:  Diagnosis Date   Anemia    with periods   Ankle fracture    right   Anxiety    Asthma    Back pain    Bipolar 1 disorder (HCC)    Depression    Bipolar    Endometriosis    Fibromyalgia    GERD  (gastroesophageal reflux disease)    Headache    MIGRAINES   Hyperlipidemia    IBS (irritable bowel syndrome)    Ovarian cyst    PCOS (polycystic ovarian syndrome)    Pneumonia    age 19   PONV (postoperative nausea and vomiting)    nausea after wisdom teeth extraction   Scoliosis    Vaginal Pap smear, abnormal    bx ok    PAST SURGICAL HISTORY: Past Surgical History:  Procedure Laterality Date   ABDOMINAL HYSTERECTOMY     FRACTURE SURGERY     ankle surgery with impant    HYSTEROSCOPY WITH D & C N/A 09/22/2019   Procedure: DILATATION AND CURETTAGE /HYSTEROSCOPY;  Surgeon: Ward, Elenora Fender, MD;  Location: [facility];  Service: Gynecology;  Laterality: N/A;   LYSIS OF ADHESION N/A 05/08/2021   Procedure: LYSIS OF ADHESION, EXCISION OF ENDOMETRIOSIS;  Surgeon: Christeen Douglas, MD;  Location: [facility];  Service: Gynecology;  Laterality: N/A;   ROBOTIC ASSISTED DIAGNOSTIC LAPAROSCOPY N/A 02/18/2017   Procedure: ROBOTIC ASSISTED DIAGNOSTIC LAPAROSCOPY,EXCISION AND ABLATION OF ENDOMETRIOSIS,LYSIS OF ADHESIONS;  Surgeon: Olivia Mackie, MD;  Location: [facility];  Service: Gynecology;  Laterality: N/A;   ROBOTIC ASSISTED LAPAROSCOPIC HYSTERECTOMY AND SALPINGECTOMY Bilateral 05/08/2021   Procedure: XI ROBOTIC ASSISTED TOTAL LAPAROSCOPIC HYSTERECTOMY AND SALPINGECTOMY;  Surgeon: Christeen Douglas, MD;  Location: [facility];  Service: Gynecology;  Laterality: Bilateral;   VAGINAL DELIVERY N/A 08/14/2020   Procedure: VAGINAL DELIVERY;  Surgeon: Feliberto Gottron Ihor Austin, MD;  Location: [facility];  Service: Obstetrics;  Laterality: N/A;   WISDOM TOOTH EXTRACTION  2013    FAMILY HISTORY: Family History  Problem Relation Age of Onset   Endometriosis Mother    Bipolar disorder Mother    Diabetes Mother        prediabetes   Schizophrenia Father    Depression Father    Breast cancer Paternal Grandmother    Cancer  Paternal Grandmother    Depression Paternal Grandmother    Hypertension Other     Hyperlipidemia Other    Asthma Other    Heart disease Neg Hx    Colon cancer Neg Hx    Ovarian cancer Neg Hx     SOCIAL HISTORY: Social History   Socioeconomic History   Marital status: Married    Spouse name: Renae Fickle   Number of children: 3   Years of education: Not on file   Highest education level: Associate degree: academic program  Occupational History   Occupation: unemployeed   Tobacco Use   Smoking status: Former    Current packs/day: 0.00    Types: Cigarettes    Start date: 12/20/2008    Quit date: 12/20/2009    Years since quitting: 13.9   Smokeless tobacco: Never   Tobacco comments:    age [age], quit, 1 cig daily  Vaping Use   Vaping status: Never Used  Substance and Sexual Activity   Alcohol use: Yes    Comment: Randomly   Drug use: Never   Sexual activity: Not Currently    Partners: Female, Female    Birth control/protection: Surgical    Comment: hysterectomy  Other Topics Concern   Not on file  Social History Narrative   Lives at home with husband, son, and 51 year old twin girls    Right handed   Currently [redacted] weeks pregnant with twins (as of 01/17/2020)  ---update 03/02/2023 twins are now 30 years old    Caffeine: every once in awhile    Social Drivers of Health   Financial Resource Strain: High Risk (10/04/2023)   Overall Financial Resource Strain (CARDIA)    Difficulty of Paying Living Expenses: Hard  Food Insecurity: Food Insecurity Present (10/04/2023)   Hunger Vital Sign    Worried About Running Out of Food in the Last Year: Often true    Ran Out of Food in the Last Year: Sometimes true  Transportation Needs: Unmet Transportation Needs (10/04/2023)   PRAPARE - Administrator, Civil Service (Medical): Yes    Lack of Transportation (Non-Medical): No  Physical Activity: Inactive (10/04/2023)   Exercise Vital Sign    Days of Exercise per Week: 0 days    Minutes of Exercise per Session: 0 min  Stress: Stress Concern Present (10/04/2023)    [employer] of Occupational Health - Occupational Stress Questionnaire    Feeling of Stress : Very much  Social Connections: Moderately Isolated (10/04/2023)   Social Connection and Isolation Panel [NHANES]    Frequency of Communication with Friends and Family: More than three times a week    Frequency of Social Gatherings with Friends and Family: Once a week    Attends Religious Services: Never    Database administrator or Organizations: No    Attends Banker Meetings: Never    Marital Status: Married  Catering manager Violence: Not At Risk (10/04/2023)   Humiliation, Afraid, Rape, and Kick questionnaire    Fear of Current or Ex-Partner: No    Emotionally Abused: No    Physically Abused: No    Sexually Abused: No    PHYSICAL EXAM  Vitals:   12/15/23 0828  BP: 111/79  Pulse: 78  Weight: 190 lb (86.2 kg)  Height: 5\' 1"  (1.549 m)    Body mass index is 35.9 kg/m.  Generalized: Well developed, in no acute distress  Neurological examination  Mentation: Alert oriented to time, place, history taking.  Follows all commands speech and language fluent Cranial nerve II-XII: Pupils were equal round reactive to light. Extraocular movements were full, visual field were full on confrontational test. Facial sensation and strength were normal. Head turning and shoulder shrug  were normal and symmetric. Motor: The motor testing reveals 5 over 5 strength of all 4 extremities. Good symmetric motor tone is noted throughout.  Sensory: Sensory testing is intact to soft touch on all 4 extremities. No evidence of extinction is noted.  Coordination: Cerebellar testing reveals good finger-nose-finger and heel-to-shin bilaterally.  Gait and station: Gait is normal.  Reflexes: Deep tendon reflexes are symmetric and normal bilaterally.   DIAGNOSTIC DATA (LABS, IMAGING, TESTING) - I reviewed patient records, labs, notes, testing and imaging myself where available.  Lab Results   Component Value Date   WBC 10.6 03/24/2023   HGB 13.9 03/24/2023   HCT 41.7 03/24/2023   MCV 88.5 03/24/2023   PLT 365 03/24/2023      Component Value Date/Time   NA 138 03/24/2023 1422   NA 138 08/25/2018 1448   NA 138 07/11/2014 1446   K 4.4 03/24/2023 1422   K 3.8 07/11/2014 1446   CL 102 03/24/2023 1422   CL 102 07/11/2014 1446   CO2 23 03/24/2023 1422   CO2 29 07/11/2014 1446   GLUCOSE 97 03/24/2023 1422   GLUCOSE 89 07/11/2014 1446   BUN 9 03/24/2023 1422   BUN 11 08/25/2018 1448   BUN 10 07/11/2014 1446   CREATININE 0.59 03/24/2023 1422   CALCIUM 9.4 03/24/2023 1422   CALCIUM 9.2 07/11/2014 1446   PROT 7.1 03/24/2023 1422   PROT 6.9 08/25/2018 1448   PROT 7.5 08/26/2013 0821   ALBUMIN 3.6 09/08/2022 2257   ALBUMIN 4.1 08/25/2018 1448   ALBUMIN 3.4 (L) 08/26/2013 0821   AST 13 03/24/2023 1422   AST 17 08/26/2013 0821   ALT 13 03/24/2023 1422   ALT 30 08/26/2013 0821   ALKPHOS 58 09/08/2022 2257   ALKPHOS 102 08/26/2013 0821   BILITOT 0.2 03/24/2023 1422   BILITOT <0.2 08/25/2018 1448   BILITOT 0.2 08/26/2013 0821   GFRNONAA >60 09/08/2022 2257   GFRNONAA 126 12/14/2018 0000   GFRAA >60 03/01/2020 2103   GFRAA 146 12/14/2018 0000   Lab Results  Component Value Date   CHOL 225 (H) 01/08/2023   HDL 54 01/08/2023   LDLCALC 126 (H) 01/08/2023   TRIG 317 (H) 01/08/2023   CHOLHDL 4.2 01/08/2023   Lab Results  Component Value Date   HGBA1C 5.4 01/08/2023   Lab Results  Component Value Date   VITAMINB12 376 03/11/2021   Lab Results  Component Value Date   TSH 0.71 10/09/2021    Margie Ege, AGNP-C, DNP 12/15/2023, 8:37 AM [facility] [address] [place], [place] [address] [phone]

## 2023-12-15 ENCOUNTER — Encounter: Payer: Self-pay | Admitting: Neurology

## 2023-12-15 ENCOUNTER — Ambulatory Visit (INDEPENDENT_AMBULATORY_CARE_PROVIDER_SITE_OTHER): Payer: Medicaid Other | Admitting: Neurology

## 2023-12-15 VITALS — BP 111/79 | HR 78 | Ht 61.0 in | Wt 190.0 lb

## 2023-12-15 DIAGNOSIS — F3181 Bipolar II disorder: Secondary | ICD-10-CM | POA: Diagnosis not present

## 2023-12-15 DIAGNOSIS — K219 Gastro-esophageal reflux disease without esophagitis: Secondary | ICD-10-CM | POA: Diagnosis not present

## 2023-12-15 DIAGNOSIS — G43709 Chronic migraine without aura, not intractable, without status migrainosus: Secondary | ICD-10-CM

## 2023-12-15 DIAGNOSIS — F411 Generalized anxiety disorder: Secondary | ICD-10-CM | POA: Diagnosis not present

## 2023-12-15 DIAGNOSIS — F330 Major depressive disorder, recurrent, mild: Secondary | ICD-10-CM | POA: Diagnosis not present

## 2023-12-15 MED ORDER — EMGALITY 120 MG/ML ~~LOC~~ SOAJ: 240.0000 mg | SUBCUTANEOUS | 0 refills | Status: DC

## 2023-12-15 MED ORDER — EMGALITY 120 MG/ML ~~LOC~~ SOAJ: 120.0000 mg | SUBCUTANEOUS | 11 refills | Status: DC

## 2023-12-15 MED ORDER — NURTEC 75 MG PO TBDP: 75.0000 mg | ORAL_TABLET | ORAL | 11 refills | Status: DC | PRN

## 2023-12-15 MED ORDER — ONDANSETRON 4 MG PO TBDP: 4.0000 mg | ORAL_TABLET | Freq: Three times a day (TID) | ORAL | 1 refills | Status: DC | PRN

## 2023-12-15 NOTE — Patient Instructions (Addendum)
 Start Emgality for migraine prevention, 1st injection is loading dose, will be 2 pens, then 1 pen monthly for maintenance   Try Nurtec 1 tablet as needed max 1 a day for acute migraine treatment, for prolonged migraine , you can take 2 daily for 3-4 days to break migraine cycle

## 2023-12-16 ENCOUNTER — Telehealth: Payer: Self-pay | Admitting: Pharmacist

## 2023-12-16 ENCOUNTER — Telehealth: Payer: Medicaid Other | Admitting: Physician Assistant

## 2023-12-16 DIAGNOSIS — N760 Acute vaginitis: Secondary | ICD-10-CM | POA: Diagnosis not present

## 2023-12-16 DIAGNOSIS — B9689 Other specified bacterial agents as the cause of diseases classified elsewhere: Secondary | ICD-10-CM

## 2023-12-16 MED ORDER — METRONIDAZOLE 500 MG PO TABS: 500.0000 mg | ORAL_TABLET | Freq: Two times a day (BID) | ORAL | 0 refills | Status: DC

## 2023-12-16 NOTE — Progress Notes (Signed)
 Virtual Visit Consent   Janet Rich, you are scheduled for a virtual visit with a [facility] provider today. Just as with appointments in the office, your consent must be obtained to participate. Your consent will be active for this visit and any virtual visit you may have with one of our providers in the next 365 days. If you have a MyChart account, a copy of this consent can be sent to you electronically.  As this is a virtual visit, video technology does not allow for your provider to perform a traditional examination. This may limit your provider's ability to fully assess your condition. If your provider identifies any concerns that need to be evaluated in person or the need to arrange testing (such as labs, EKG, etc.), we will make arrangements to do so. Although advances in technology are sophisticated, we cannot ensure that it will always work on either your end or our end. If the connection with a video visit is poor, the visit may have to be switched to a telephone visit. With either a video or telephone visit, we are not always able to ensure that we have a secure connection.  By engaging in this virtual visit, you consent to the provision of healthcare and authorize for your insurance to be billed (if applicable) for the services provided during this visit. Depending on your insurance coverage, you may receive a charge related to this service.  I need to obtain your verbal consent now. Are you willing to proceed with your visit today? Janet Rich has provided verbal consent on 12/16/2023 for a virtual visit (video or telephone). Janet Rich, [place]  Date: 12/16/2023 6:53 PM   Virtual Visit via Video Note   I, Janet Rich, connected with  Janet Rich  ([id number], 11/07/1995) on 12/16/23 at  7:00 PM EST by a video-enabled telemedicine application and verified that I am speaking with the correct person using two identifiers.  Location: Patient: Virtual Visit Location  Patient: Home Provider: Virtual Visit Location Provider: Home Office   I discussed the limitations of evaluation and management by telemedicine and the availability of in person appointments. The patient expressed understanding and agreed to proceed.    History of Present Illness: Janet Rich is a 28 y.o. who identifies as a female who was assigned female at birth, and is being seen today for concerns of possible bacterial vaginosis. Notes similar issue about two months ago with BV and this feels similar.  Notes symptoms for about 2 days of vaginal irritation and slight discharge and odor. Took OTC yeast medication just in case without any improvement.   HPI: HPI  Problems:  Patient Active Problem List   Diagnosis Date Noted   Need for post exposure prophylaxis for rabies 11/02/2023   Cat bite 11/02/2023   Cat scratch 11/02/2023   Mixed hyperlipidemia 07/12/2023   Migraine without status migrainosus, not intractable 07/12/2023   Attention deficit hyperactivity disorder (ADHD), combined type 01/08/2023   Chronic migraine w/o aura w/o status migrainosus, not intractable 07/30/2022   Bulging of lumbar intervertebral disc 06/16/2022   Fibromyalgia 03/09/2022   Chronic back pain 03/09/2022   Excessive daytime sleepiness 11/28/2021   Endometriosis determined by laparoscopy 05/08/2021   Myalgia 03/11/2021   Elevated BP without diagnosis of hypertension 01/08/2021   Dichorionic diamniotic twin pregnancy in third trimester 08/14/2020   Preeclampsia 08/14/2020   Twin pregnancy delivered vaginally 08/14/2020   Preterm labor 06/17/2020   Right trigeminal neuralgia 11/08/2019   Endometriosis 03/27/2019  PCOS (polycystic ovarian syndrome) 03/27/2019   Moderate persistent asthma 03/07/2019   Post-nasal drainage 03/07/2019   Irritable bowel syndrome with diarrhea 03/07/2019   Class 1 obesity due to excess calories with serious comorbidity and body mass index (BMI) of 34.0 to 34.9 in adult  09/02/2018   Bipolar 2 disorder (HCC) 08/12/2018   History of low birth weight 03/11/2018   Vulvar atrophy 03/11/2018   History of migraine headaches 05/01/2015   Anxiety 05/01/2015    Allergies:  Allergies  Allergen Reactions   Amoxicillin Shortness Of Breath and Nausea Only    Dizziness   Other     Must have anti-emetic prior to being given any narcotics    Latex Rash and Other (See Comments)   Medications:  Current Outpatient Medications:    metroNIDAZOLE (FLAGYL) 500 MG tablet, Take 1 tablet (500 mg total) by mouth 2 (two) times daily., Disp: 14 tablet, Rfl: 0   acetaminophen (TYLENOL) 500 MG tablet, Take 2 tablets (1,000 mg total) by mouth every 6 (six) hours as needed for moderate pain., Disp: 30 tablet, Rfl: 0   albuterol (PROVENTIL) (2.5 MG/3ML) 0.083% nebulizer solution, Take 3 mLs (2.5 mg total) by nebulization every 6 (six) hours as needed for wheezing or shortness of breath., Disp: 75 mL, Rfl: 12   albuterol (VENTOLIN HFA) 108 (90 Base) MCG/ACT inhaler, Inhale 1-2 puffs into the lungs every 6 (six) hours as needed for wheezing or shortness of breath., Disp: 1 each, Rfl: 3   Albuterol-Budesonide (AIRSUPRA) 90-80 MCG/ACT AERO, Inhale 1-2 puffs into the lungs as directed., Disp: 10.7 g, Rfl: 5   ALPRAZolam (XANAX) 0.25 MG tablet, Take 0.25 mg by mouth at bedtime as needed for anxiety., Disp: , Rfl:    ALPRAZolam (XANAX) 0.5 MG tablet, Take 0.5 mg by mouth 2 (two) times daily as needed., Disp: , Rfl:    Ascorbic Acid (VITAMIN C) 100 MG tablet, Take 100 mg by mouth daily., Disp: , Rfl:    cholecalciferol (VITAMIN D3) 25 MCG (1000 UNIT) tablet, Take 1,000 Units by mouth daily., Disp: , Rfl:    COMIRNATY syringe, Inject 0.3 mLs into the muscle once. (Patient not taking: Reported on 12/15/2023), Disp: , Rfl:    cyclobenzaprine (FLEXERIL) 10 MG tablet, Take 10 mg by mouth at bedtime., Disp: , Rfl:    Dextroamphetamine Sulfate 20 MG TABS, Take 40 mg by mouth as directed., Disp: , Rfl:     famotidine (PEPCID) 20 MG tablet, Take 1 tablet (20 mg total) by mouth 2 (two) times daily., Disp: 180 tablet, Rfl: 1   gabapentin (NEURONTIN) 100 MG capsule, Take 4 capsules (400 mg total) by mouth at bedtime., Disp: 140 capsule, Rfl: 4   Galcanezumab-gnlm (EMGALITY) 120 MG/ML SOAJ, Inject 120 mg into the skin every 30 (thirty) days., Disp: 1.12 mL, Rfl: 11   Galcanezumab-gnlm (EMGALITY) 120 MG/ML SOAJ, Inject 240 mg into the skin every 30 (thirty) days., Disp: 2 mL, Rfl: 0   ibuprofen (ADVIL) 800 MG tablet, Take 1 tablet (800 mg total) by mouth every 8 (eight) hours as needed., Disp: 30 tablet, Rfl: 2   lamoTRIgine (LAMICTAL) 200 MG tablet, Take 100 mg by mouth 2 (two) times daily., Disp: , Rfl:    Lurasidone HCl 60 MG TABS, Take 60 mg by mouth every evening., Disp: , Rfl:    MAGNESIUM MALATE PO, Take 1 tablet by mouth daily at 6 (six) AM., Disp: , Rfl:    Multiple Vitamin (MULTIVITAMIN ADULT PO), Take 1 tablet by  mouth daily., Disp: , Rfl:    omeprazole (PRILOSEC) 40 MG capsule, Take 1 capsule (40 mg total) by mouth 2 (two) times daily before a meal., Disp: 60 capsule, Rfl: 1   ondansetron (ZOFRAN-ODT) 4 MG disintegrating tablet, Take 1 tablet (4 mg total) by mouth every 8 (eight) hours as needed for nausea or vomiting., Disp: 20 tablet, Rfl: 1   QUEtiapine (SEROQUEL) 25 MG tablet, Take 25 mg by mouth at bedtime., Disp: , Rfl:    Rimegepant Sulfate (NURTEC) 75 MG TBDP, Take 1 tablet (75 mg total) by mouth as needed., Disp: 8 tablet, Rfl: 11   vitamin B-12 (CYANOCOBALAMIN) 500 MCG tablet, Take 500 mcg by mouth daily., Disp: , Rfl:   Observations/Objective: Patient is well-developed, well-nourished in no acute distress.  Resting comfortably at home.  Head is normocephalic, atraumatic.  No labored breathing. Speech is clear and coherent with logical content.  Patient is alert and oriented at baseline.   Assessment and Plan: 1. Bacterial vaginosis (Primary) - metroNIDAZOLE (FLAGYL) 500 MG  tablet; Take 1 tablet (500 mg total) by mouth 2 (two) times daily.  Dispense: 14 tablet; Refill: 0  Supportive measures reviewed. Flagyl per orders.  Discussed use of Boric acid va suppositories. Follow-up with GYN giving this is becoming a more common occurrence.   Follow Up Instructions: I discussed the assessment and treatment plan with the patient. The patient was provided an opportunity to ask questions and all were answered. The patient agreed with the plan and demonstrated an understanding of the instructions.  A copy of instructions were sent to the patient via MyChart unless otherwise noted below.   The patient was advised to call back or seek an in-person evaluation if the symptoms worsen or if the condition fails to improve as anticipated.    Janet Climes, PA-C

## 2023-12-16 NOTE — Telephone Encounter (Signed)
 Pharmacy Patient Advocate Encounter   Received notification from Patient Pharmacy that prior authorization for Nurtec 75MG  dispersible tablets is required/requested.   Insurance verification completed.   The patient is insured through [facility] [place] .   Per test claim: PA required; PA submitted to above mentioned insurance via CoverMyMeds Key/confirmation #/EOC [id number] Status is pending

## 2023-12-16 NOTE — Telephone Encounter (Signed)
 Pharmacy Patient Advocate Encounter  Received notification from [facility] Medicaid that Prior Authorization for Nurtec 75MG  dispersible tablets has been APPROVED from 12/02/2023 to 12/15/2024   PA #/Case ID/Reference #:  [id number]

## 2023-12-16 NOTE — Patient Instructions (Signed)
 [facility] Plan, thank you for joining Piedad Climes, PA-C for today's virtual visit.  While this provider is not your primary care provider (PCP), if your PCP is located in our provider database this encounter information will be shared with them immediately following your visit.   A [facility] MyChart account gives you access to today's visit and all your visits, tests, and labs performed at [facility] " click here if you don't have a  MyChart account or go to [email or web address]  Consent: (Patient) Janet Rich provided verbal consent for this virtual visit at the beginning of the encounter.  Current Medications:  Current Outpatient Medications:    acetaminophen (TYLENOL) 500 MG tablet, Take 2 tablets (1,000 mg total) by mouth every 6 (six) hours as needed for moderate pain., Disp: 30 tablet, Rfl: 0   albuterol (PROVENTIL) (2.5 MG/3ML) 0.083% nebulizer solution, Take 3 mLs (2.5 mg total) by nebulization every 6 (six) hours as needed for wheezing or shortness of breath., Disp: 75 mL, Rfl: 12   albuterol (VENTOLIN HFA) 108 (90 Base) MCG/ACT inhaler, Inhale 1-2 puffs into the lungs every 6 (six) hours as needed for wheezing or shortness of breath., Disp: 1 each, Rfl: 3   Albuterol-Budesonide (AIRSUPRA) 90-80 MCG/ACT AERO, Inhale 1-2 puffs into the lungs as directed., Disp: 10.7 g, Rfl: 5   ALPRAZolam (XANAX) 0.25 MG tablet, Take 0.25 mg by mouth at bedtime as needed for anxiety., Disp: , Rfl:    ALPRAZolam (XANAX) 0.5 MG tablet, Take 0.5 mg by mouth 2 (two) times daily as needed., Disp: , Rfl:    Ascorbic Acid (VITAMIN C) 100 MG tablet, Take 100 mg by mouth daily., Disp: , Rfl:    cholecalciferol (VITAMIN D3) 25 MCG (1000 UNIT) tablet, Take 1,000 Units by mouth daily., Disp: , Rfl:    COMIRNATY syringe, Inject 0.3 mLs into the muscle once. (Patient not taking: Reported on 12/15/2023), Disp: , Rfl:    cyclobenzaprine (FLEXERIL) 10 MG tablet, Take 10 mg by mouth  at bedtime., Disp: , Rfl:    Dextroamphetamine Sulfate 20 MG TABS, Take 40 mg by mouth as directed., Disp: , Rfl:    famotidine (PEPCID) 20 MG tablet, Take 1 tablet (20 mg total) by mouth 2 (two) times daily., Disp: 180 tablet, Rfl: 1   gabapentin (NEURONTIN) 100 MG capsule, Take 4 capsules (400 mg total) by mouth at bedtime., Disp: 140 capsule, Rfl: 4   Galcanezumab-gnlm (EMGALITY) 120 MG/ML SOAJ, Inject 120 mg into the skin every 30 (thirty) days., Disp: 1.12 mL, Rfl: 11   Galcanezumab-gnlm (EMGALITY) 120 MG/ML SOAJ, Inject 240 mg into the skin every 30 (thirty) days., Disp: 2 mL, Rfl: 0   ibuprofen (ADVIL) 800 MG tablet, Take 1 tablet (800 mg total) by mouth every 8 (eight) hours as needed., Disp: 30 tablet, Rfl: 2   lamoTRIgine (LAMICTAL) 200 MG tablet, Take 100 mg by mouth 2 (two) times daily., Disp: , Rfl:    Lurasidone HCl 60 MG TABS, Take 60 mg by mouth every evening., Disp: , Rfl:    MAGNESIUM MALATE PO, Take 1 tablet by mouth daily at 6 (six) AM., Disp: , Rfl:    Multiple Vitamin (MULTIVITAMIN ADULT PO), Take 1 tablet by mouth daily., Disp: , Rfl:    omeprazole (PRILOSEC) 40 MG capsule, Take 1 capsule (40 mg total) by mouth 2 (two) times daily before a meal., Disp: 60 capsule, Rfl: 1   ondansetron (ZOFRAN-ODT) 4 MG disintegrating tablet, Take 1 tablet (4 mg total)  by mouth every 8 (eight) hours as needed for nausea or vomiting., Disp: 20 tablet, Rfl: 1   QUEtiapine (SEROQUEL) 25 MG tablet, Take 25 mg by mouth at bedtime., Disp: , Rfl:    Rimegepant Sulfate (NURTEC) 75 MG TBDP, Take 1 tablet (75 mg total) by mouth as needed., Disp: 8 tablet, Rfl: 11   vitamin B-12 (CYANOCOBALAMIN) 500 MCG tablet, Take 500 mcg by mouth daily., Disp: , Rfl:    Medications ordered in this encounter:  No orders of the defined types were placed in this encounter.    *If you need refills on other medications prior to your next appointment, please contact your pharmacy*  Follow-Up: Call back or seek an  in-person evaluation if the symptoms worsen or if the condition fails to improve as anticipated.  [facility] [phone]  Other Instructions Vaginal Infection (Bacterial Vaginosis): What to Know  Bacterial vaginosis is an infection of the vagina. It happens when the balance of normal germs (bacteria) in the vagina changes. If you don't get treated, it can make it easier for you to get other infections from sex. These are called sexually transmitted infections (STIs). If you're pregnant, you need to get treated right away. This infection can cause a baby to be born early or at a low birth weight. What are the causes? This infection happens when too many harmful germs grow in the vagina. You can't get this infection from toilet seats, bedsheets, swimming pools, or things that touch your vagina. What increases the risk? Having sex with a new person or more than one person. Having sex without protection. Douching. Having an intrauterine device (IUD). Smoking. Using drugs or drinking alcohol. These can lead you to do risky things. Taking certain antibiotics. Being pregnant. What are the signs or symptoms? Some females have no symptoms. Symptoms may include: A gray or white discharge from your vagina. It can be watery or foamy. A fishy smell. This can happen after sex or during your menstrual period. Itching in and around your vagina. Burning or pain when you pee. How is this treated? This infection is treated with antibiotics. These may be given to you as: A pill. A cream for your vagina. A medicine that you put into your vagina (suppository). If the infection comes back, you may need more antibiotics. Follow these instructions at home: Medicines Take your medicines as told. Take or use your antibiotics as told. Do not stop using them even if you start to feel better. General instructions If the person you have sex with is a female, tell her that you have this  infection. She will need to follow up with her doctor. Female partners don't need to be treated. Do not have sex until you finish treatment. Drink more fluids as told. Keep your vagina and butt clean. Wash these areas with warm water each day. Wipe from front to back after you poop. If you're breastfeeding a baby, talk to your doctor if you should keep doing so during treatment. How is this prevented? Self-care Do not douche. Do not use deodorant sprays on your vagina. Wear cotton underwear. Do not wear tight pants and pantyhose, especially in the summer. Safe sex Use condoms the correct way and every time you have sex. Use dental dams to protect yourself during oral sex. Limit how many people you have sex with. Get tested for STIs. The person you have sex with should also get tested. Drugs and alcohol Do not smoke, vape, or  use nicotine or tobacco. Do not use drugs. Limit the amount of alcohol you drink because it can lead you to do risky things. Where to find more information To learn more: Go to TonerPromos.no. Click Health Topics A-Z. Type "bacterial vaginosis" in the search bar. American Sexual Health Association (ASHA): ashasexualhealth.org U.S. Department of Health and [employer], Office on Women's Health: TravelLesson.ca Contact a doctor if: Your symptoms don't get better, even after treatment. You have more discharge or pain when you pee. You have a fever or chills. You have pain in your belly or in the area between your hips. You have pain during sex. You bleed from your vagina between menstrual periods. This information is not intended to replace advice given to you by your health care provider. Make sure you discuss any questions you have with your health care provider. Document Revised: 03/24/2023 Document Reviewed: 03/24/2023 Elsevier Patient Education  2024 Elsevier Inc.  Boric Acid Vaginal Suppositories What is this medication? BORIC ACID (BOHR ik AS id) may  support vaginal health. It may relieve the symptoms of a yeast infection, such as itching, burning, and odor. This medicine may be used for other purposes; ask your health care provider or pharmacist if you have questions. COMMON BRAND NAME(S): AZO Boric Acid with Aloe Vera, Hylafem What should I tell my care team before I take this medication? They need to know if you have any of these conditions: Diabetes Frequent infections HIV or AIDS Immune system problems An unusual or allergic reaction to boric acid, other medications, foods, dyes, or preservatives Pregnant or trying to get pregnant Breast-feeding How should I use this medication? This medication is for use in the vagina. Do not take by mouth. Follow the directions on the prescription label. Read package directions carefully before using. Wash hands before and after use. Use this medication at bedtime, unless otherwise directed by your care team. Do not use your medication more often than directed. Do not stop using this medication except on your care team's advice. Talk to your care team about the use of this medication in children. This medication is not approved for use in children. Overdosage: If you think you have taken too much of this medicine contact a poison control center or emergency room at once. NOTE: This medicine is only for you. Do not share this medicine with others. What if I miss a dose? If you miss a dose, use it as soon as you can. If it is almost time for your next dose, use only that dose. Do not use double or extra doses. What may interact with this medication? Interactions are not expected. Do not use any other vaginal products without telling your care team. This list may not describe all possible interactions. Give your health care provider a list of all the medicines, herbs, non-prescription drugs, or dietary supplements you use. Also tell them if you smoke, drink alcohol, or use illegal drugs. Some items may  interact with your medicine. What should I watch for while using this medication? Tell your care team if your symptoms do not start to get better within a few days. It is better not to have sex until you have finished your treatment. This medication may cause condoms, diaphragms, and spermicides to not work as well. Do not rely on any of these methods to prevent sexually transmitted infections (STIs) or pregnancy while you are using this medication. Vaginal medications may come out of the vagina during treatment. To keep the medication  from getting on your clothing, wear a panty liner. The use of tampons is not recommended. To help clear up the infection, wear freshly washed cotton, not synthetic, underwear. What side effects may I notice from receiving this medication? Side effects that you should report to your care team as soon as possible: Allergic reactions--skin rash, itching, hives, swelling of the face, lips, tongue, or throat Unusual vaginal discharge, itching, or odor Side effects that usually do not require medical attention (report to your care team if they continue or are bothersome): Vaginal irritation at the application site This list may not describe all possible side effects. Call your doctor for medical advice about side effects. You may report side effects to FDA at 1-800-FDA-1088. Where should I keep my medication? Keep out of the reach of children and pets. Store in a cool, dry place between 15 and 30 degrees C (59 and 86 degrees F). Keep away from sunlight. Throw away any unused medication after the expiration date. NOTE: This sheet is a summary. It may not cover all possible information. If you have questions about this medicine, talk to your doctor, pharmacist, or health care provider.  2024 Elsevier/Gold Standard (2021-09-22 00:00:00)  If you have been instructed to have an in-person evaluation today at a local Urgent Care facility, please use the link below. It will take  you to a list of all of our available [facility], including address, phone number and hours of operation. Please do not delay care.  [facility]  If you or a family member do not have a primary care provider, use the link below to schedule a visit and establish care. When you choose a [facility] primary care physician or advanced practice provider, you gain a long-term partner in health. Find a Primary Care Provider  Learn more about [facility] in-office and virtual care options: [facility] - Get Care Now

## 2023-12-17 ENCOUNTER — Ambulatory Visit: Payer: Medicaid Other

## 2023-12-17 DIAGNOSIS — Z23 Encounter for immunization: Secondary | ICD-10-CM

## 2023-12-17 DIAGNOSIS — Z719 Counseling, unspecified: Secondary | ICD-10-CM

## 2023-12-17 NOTE — Progress Notes (Signed)
 No vaccines given today. Up to date with Covid vaccines per S.O.

## 2023-12-18 DIAGNOSIS — Z419 Encounter for procedure for purposes other than remedying health state, unspecified: Secondary | ICD-10-CM | POA: Diagnosis not present

## 2023-12-19 ENCOUNTER — Ambulatory Visit
Admission: RE | Admit: 2023-12-19 | Discharge: 2023-12-19 | Disposition: A | Discharge status: Home / Self Care | Source: Ambulatory Visit

## 2023-12-19 VITALS — BP 116/78 | HR 89 | Temp 98.9°F | Resp 18 | Ht 61.0 in | Wt 190.0 lb

## 2023-12-19 DIAGNOSIS — N898 Other specified noninflammatory disorders of vagina: Secondary | ICD-10-CM | POA: Diagnosis not present

## 2023-12-19 NOTE — ED Provider Notes (Addendum)
 [facility]    CSN: [id number] Arrival date & time: 12/19/23  0947      History   Chief Complaint Chief Complaint  Patient presents with   Hemorrhoids    HPI Janet Rich is a 28 y.o. female.   Patient here today for evaluation of vaginal irritation.  She reports that she has been treated for BV and has been inserting boric acid suppositories but when doing so she felt a protrusion and is concerned for possible prolapse.  She has had hysterectomy and does not have a cervix.  The history is provided by the patient.    Past Medical History:  Diagnosis Date   Anemia    with periods   Ankle fracture    right   Anxiety    Asthma    Back pain    Bipolar 1 disorder (HCC)    Depression    Bipolar    Endometriosis    Fibromyalgia    GERD (gastroesophageal reflux disease)    Headache    MIGRAINES   Hyperlipidemia    IBS (irritable bowel syndrome)    Ovarian cyst    PCOS (polycystic ovarian syndrome)    Pneumonia    age 60   PONV (postoperative nausea and vomiting)    nausea after wisdom teeth extraction   Scoliosis    Vaginal Pap smear, abnormal    bx ok    Patient Active Problem List   Diagnosis Date Noted   Need for post exposure prophylaxis for rabies 11/02/2023   Cat bite 11/02/2023   Cat scratch 11/02/2023   Mixed hyperlipidemia 07/12/2023   Migraine without status migrainosus, not intractable 07/12/2023   Attention deficit hyperactivity disorder (ADHD), combined type 01/08/2023   Chronic migraine w/o aura w/o status migrainosus, not intractable 07/30/2022   Bulging of lumbar intervertebral disc 06/16/2022   Fibromyalgia 03/09/2022   Chronic back pain 03/09/2022   Excessive daytime sleepiness 11/28/2021   Endometriosis determined by laparoscopy 05/08/2021   Myalgia 03/11/2021   Elevated BP without diagnosis of hypertension 01/08/2021   Dichorionic diamniotic twin pregnancy in third trimester 08/14/2020   Preeclampsia 08/14/2020   Twin  pregnancy delivered vaginally 08/14/2020   Preterm labor 06/17/2020   Right trigeminal neuralgia 11/08/2019   Endometriosis 03/27/2019   PCOS (polycystic ovarian syndrome) 03/27/2019   Moderate persistent asthma 03/07/2019   Post-nasal drainage 03/07/2019   Irritable bowel syndrome with diarrhea 03/07/2019   Class 1 obesity due to excess calories with serious comorbidity and body mass index (BMI) of 34.0 to 34.9 in adult 09/02/2018   Bipolar 2 disorder (HCC) 08/12/2018   History of low birth weight 03/11/2018   Vulvar atrophy 03/11/2018   History of migraine headaches 05/01/2015   Anxiety 05/01/2015    Past Surgical History:  Procedure Laterality Date   ABDOMINAL HYSTERECTOMY     FRACTURE SURGERY     ankle surgery with impant    HYSTEROSCOPY WITH D & C N/A 09/22/2019   Procedure: DILATATION AND CURETTAGE /HYSTEROSCOPY;  Surgeon: Ward, Elenora Fender, MD;  Location: [facility];  Service: Gynecology;  Laterality: N/A;   LYSIS OF ADHESION N/A 05/08/2021   Procedure: LYSIS OF ADHESION, EXCISION OF ENDOMETRIOSIS;  Surgeon: Christeen Douglas, MD;  Location: [facility];  Service: Gynecology;  Laterality: N/A;   ROBOTIC ASSISTED DIAGNOSTIC LAPAROSCOPY N/A 02/18/2017   Procedure: ROBOTIC ASSISTED DIAGNOSTIC LAPAROSCOPY,EXCISION AND ABLATION OF ENDOMETRIOSIS,LYSIS OF ADHESIONS;  Surgeon: Olivia Mackie, MD;  Location: [facility];  Service: Gynecology;  Laterality: N/A;  ROBOTIC ASSISTED LAPAROSCOPIC HYSTERECTOMY AND SALPINGECTOMY Bilateral 05/08/2021   Procedure: XI ROBOTIC ASSISTED TOTAL LAPAROSCOPIC HYSTERECTOMY AND SALPINGECTOMY;  Surgeon: Christeen Douglas, MD;  Location: [facility];  Service: Gynecology;  Laterality: Bilateral;   VAGINAL DELIVERY N/A 08/14/2020   Procedure: VAGINAL DELIVERY;  Surgeon: Feliberto Gottron Ihor Austin, MD;  Location: [facility];  Service: Obstetrics;  Laterality: N/A;   WISDOM TOOTH EXTRACTION  2013    OB History     Gravida  2   Para  2   Term  1   Preterm  1   AB  0    Living  3      SAB  0   IAB  0   Ectopic  0   Multiple  1   Live Births  3            Home Medications    Prior to Admission medications   Medication Sig Start Date End Date Taking? Authorizing Provider  acetaminophen (TYLENOL) 500 MG tablet Take 2 tablets (1,000 mg total) by mouth every 6 (six) hours as needed for moderate pain. 07/11/20  Yes McVey, Prudencio Pair, CNM  ALPRAZolam (XANAX) 0.25 MG tablet Take 0.25 mg by mouth at bedtime as needed for anxiety.   Yes [provider]  ALPRAZolam Prudy Feeler) 0.5 MG tablet Take 0.5 mg by mouth 2 (two) times daily as needed. 06/18/23  Yes [provider]  Ascorbic Acid (VITAMIN C) 100 MG tablet Take 100 mg by mouth daily.   Yes [provider]  BORIC ACID VAGINAL VA Place vaginally.   Yes [provider]  cholecalciferol (VITAMIN D3) 25 MCG (1000 UNIT) tablet Take 1,000 Units by mouth daily.   Yes [provider]  cyclobenzaprine (FLEXERIL) 10 MG tablet Take 10 mg by mouth at bedtime. 08/08/23  Yes [provider]  DULoxetine (CYMBALTA) 30 MG capsule Take 30 mg by mouth daily. 11/30/23  Yes [provider]  famotidine (PEPCID) 20 MG tablet Take 1 tablet (20 mg total) by mouth 2 (two) times daily. 01/08/23  Yes Berniece Salines, FNP  gabapentin (NEURONTIN) 100 MG capsule Take 4 capsules (400 mg total) by mouth at bedtime. 08/16/23  Yes Berniece Salines, FNP  Galcanezumab-gnlm (EMGALITY) 120 MG/ML SOAJ Inject 120 mg into the skin every 30 (thirty) days. 12/15/23  Yes Glean Salvo, NP  Galcanezumab-gnlm (EMGALITY) 120 MG/ML SOAJ Inject 240 mg into the skin every 30 (thirty) days. 12/15/23  Yes Glean Salvo, NP  ibuprofen (ADVIL) 800 MG tablet Take 1 tablet (800 mg total) by mouth every 8 (eight) hours as needed. 11/10/21  Yes Berniece Salines, FNP  lamoTRIgine (LAMICTAL) 200 MG tablet Take 100 mg by mouth 2 (two) times daily. 11/22/21  Yes [provider]  MAGNESIUM MALATE PO Take 1  tablet by mouth daily at 6 (six) AM.   Yes [provider]  metroNIDAZOLE (FLAGYL) 500 MG tablet Take 1 tablet (500 mg total) by mouth 2 (two) times daily. 12/16/23  Yes Waldon Merl, PA-C  Multiple Vitamin (MULTIVITAMIN ADULT PO) Take 1 tablet by mouth daily.   Yes [provider]  QUEtiapine (SEROQUEL) 25 MG tablet Take 25 mg by mouth at bedtime.   Yes [provider]  Rimegepant Sulfate (NURTEC) 75 MG TBDP Take 1 tablet (75 mg total) by mouth as needed. 12/15/23  Yes Glean Salvo, NP  vitamin B-12 (CYANOCOBALAMIN) 500 MCG tablet Take 500 mcg by mouth daily.   Yes [provider]  albuterol (PROVENTIL) (2.5 MG/3ML) 0.083% nebulizer solution Take 3 mLs (2.5 mg total) by nebulization every 6 (six) hours as needed for wheezing or shortness of breath. 01/08/23   Berniece Salines, FNP  albuterol (VENTOLIN HFA) 108 (90 Base) MCG/ACT inhaler Inhale 1-2 puffs into the lungs every 6 (six) hours as needed for wheezing or shortness of breath. 01/08/23   Berniece Salines, FNP  Albuterol-Budesonide (AIRSUPRA) 90-80 MCG/ACT AERO Inhale 1-2 puffs into the lungs as directed. 10/04/23   Berniece Salines, FNP  COMIRNATY syringe Inject 0.3 mLs into the muscle once. 07/11/23   [provider]  Dextroamphetamine Sulfate 20 MG TABS Take 40 mg by mouth as directed. 10/25/23   [provider]  Lurasidone HCl 60 MG TABS Take 60 mg by mouth every evening. 03/24/23   [provider]  omeprazole (PRILOSEC) 40 MG capsule Take 1 capsule (40 mg total) by mouth 2 (two) times daily before a meal. 05/11/23 07/10/23  Vanga, Loel Dubonnet, MD  ondansetron (ZOFRAN-ODT) 4 MG disintegrating tablet Take 1 tablet (4 mg total) by mouth every 8 (eight) hours as needed for nausea or vomiting. 12/15/23   Glean Salvo, NP    Family History Family History  Problem Relation Age of Onset   Endometriosis Mother    Bipolar disorder Mother    Diabetes Mother        prediabetes    Schizophrenia Father    Depression Father    Breast cancer Paternal Grandmother    Cancer Paternal Grandmother    Depression Paternal Grandmother    Hypertension Other    Hyperlipidemia Other    Asthma Other    Heart disease Neg Hx    Colon cancer Neg Hx    Ovarian cancer Neg Hx     Social History Social History   Tobacco Use   Smoking status: Former    Current packs/day: 0.00    Types: Cigarettes    Start date: 12/20/2008    Quit date: 12/20/2009    Years since quitting: 14.0   Smokeless tobacco: Never   Tobacco comments:    age [age], quit, 1 cig daily  Vaping Use   Vaping status: Never Used  Substance Use Topics   Alcohol use: Yes    Comment: Randomly   Drug use: Never     Allergies   Amoxicillin, Other, and Latex   Review of Systems Review of Systems  Constitutional:  Negative for chills and fever.  Eyes:  Negative for discharge and redness.  Respiratory:  Negative for shortness of breath.   Gastrointestinal:  Negative for abdominal pain, nausea and vomiting.  Genitourinary:  Negative for dysuria, pelvic pain and vaginal discharge.     Physical Exam Triage Vital Signs ED Triage Vitals  Encounter Vitals Group     BP 12/19/23 1022 116/78     Systolic BP Percentile --      Diastolic BP Percentile --      Pulse Rate 12/19/23 1022 89     Resp 12/19/23 1022 18     Temp 12/19/23 1022 98.9 F (37.2 C)     Temp Source 12/19/23 1022 Oral     SpO2 12/19/23 1022 99 %     Weight 12/19/23 1018 190 lb (86.2 kg)     Height 12/19/23 1018 5\' 1"  (1.549 m)     Head Circumference --      Peak Flow --      Pain Score 12/19/23 1016 0  Pain Loc --      Pain Education --      Exclude from Growth Chart --    No data found.  Updated Vital Signs BP 116/78 (BP Location: Left Arm)   Pulse 89   Temp 98.9 F (37.2 C) (Oral)   Resp 18   Ht 5\' 1"  (1.549 m)   Wt 190 lb (86.2 kg)   LMP 04/08/2021   SpO2 99%   BMI 35.90 kg/m   Visual Acuity Right Eye Distance:    Left Eye Distance:   Bilateral Distance:    Right Eye Near:   Left Eye Near:    Bilateral Near:     Physical Exam Vitals and nursing note reviewed.  Constitutional:      General: She is not in acute distress.    Appearance: Normal appearance. She is not ill-appearing.  HENT:     Head: Normocephalic and atraumatic.  Eyes:     Conjunctiva/sclera: Conjunctivae normal.  Cardiovascular:     Rate and Rhythm: Normal rate.  Pulmonary:     Effort: Pulmonary effort is normal. No respiratory distress.  Genitourinary:    Comments: External vaginal exam performed with what does appear to be protrusion to her vagina. Neurological:     Mental Status: She is alert.  Psychiatric:        Mood and Affect: Mood normal.        Behavior: Behavior normal.        Thought Content: Thought content normal.      UC Treatments / Results  Labs (all labs ordered are listed, but only abnormal results are displayed) Labs Reviewed - No data to display  EKG   Radiology No results found.  Procedures Procedures (including critical care time)  Medications Ordered in UC Medications - No data to display  Initial Impression / Assessment and Plan / UC Course  I have reviewed the triage vital signs and the nursing notes.  Pertinent labs & imaging results that were available during my care of the patient were reviewed by me and considered in my medical decision making (see chart for details).    Recommended further evaluation as soon as possible with GYN.  Patient currently not in pain, recommended further evaluation in emergency department with any worsening pain while awaiting GYN appointment.  Advised against inserting anything into vagina at this point, and advise she abstain from sexual intercourse.  Patient expresses understanding.  Final Clinical Impressions(s) / UC Diagnoses   Final diagnoses:  Vaginal irritation     Discharge Instructions       Please follow up with GYN as soon as  possible.     ED Prescriptions   None    PDMP not reviewed this encounter.   Tomi Bamberger, PA-C 12/19/23 1122    Tomi Bamberger, PA-C 12/19/23 1123

## 2023-12-19 NOTE — ED Triage Notes (Signed)
 I am currently being treated for BV, however I was examining my vagina and found a hemorrhoid or polyp. - Entered by patient

## 2023-12-19 NOTE — ED Triage Notes (Signed)
"  My BV I am treating with Suppositories" and very concerned with what I am seeing.

## 2023-12-19 NOTE — Discharge Instructions (Signed)
  Please follow up with GYN as soon as possible.

## 2023-12-20 DIAGNOSIS — N898 Other specified noninflammatory disorders of vagina: Secondary | ICD-10-CM | POA: Diagnosis not present

## 2023-12-22 DIAGNOSIS — F330 Major depressive disorder, recurrent, mild: Secondary | ICD-10-CM | POA: Diagnosis not present

## 2023-12-22 DIAGNOSIS — F411 Generalized anxiety disorder: Secondary | ICD-10-CM | POA: Diagnosis not present

## 2023-12-29 DIAGNOSIS — F411 Generalized anxiety disorder: Secondary | ICD-10-CM | POA: Diagnosis not present

## 2023-12-29 DIAGNOSIS — F330 Major depressive disorder, recurrent, mild: Secondary | ICD-10-CM | POA: Diagnosis not present

## 2024-01-07 NOTE — Progress Notes (Signed)
 BP 118/72 (Cuff Size: Large)   Pulse 99   Temp 98.4 F (36.9 C) (Oral)   Resp 16   Ht 5\' 1"  (1.549 m)   Wt 186 lb 8 oz (84.6 kg)   LMP 04/08/2021   SpO2 96%   BMI 35.24 kg/m    Subjective:    [name], female    DOB: 11/04/1995, 28 y.o.   MRN: [id number]  HPI: Janet Rich is a 29 y.o. female  Chief Complaint  Patient presents with   Medical Management of Chronic Issues    6 month recheck    Discussed the use of AI scribe software for clinical note transcription with the patient, who gave verbal consent to proceed.  History of Present Illness Janet Rich is a 28 year old female who presents with medication management and symptom review.  She is managing chronic migraines with nurtec which has been effective in allowing her to resume activities after a short rest. Last month, she experienced an eight-day migraine that required urgent care intervention. Insurance issues have delayed her access to [employer], a medication prescribed by her neurologist.  Her asthma is primarily managed with albuterol, but she has faced difficulty obtaining her inhaler due to insurance coverage issues with [employer]. She plans to switch back to Symbicort, which was previously effective. No current respiratory issues related to her asthma.  She has irritable bowel syndrome that has not been actively managed due to her reluctance to undergo a scopy during flu and COVID seasons. She reports a significant hemorrhoid that has worsened recently, which she attributes to childbirth. She has not been consistent with her Pepcid due to a lapse in GI follow-up.  She is experiencing vaginal swelling, initially suspected to be a prolapse, but attributed to ovulation and past childbirth. She is using boric acid post-intercourse to manage pH issues, which is costly. She has a follow-up scheduled in April to reassess the condition.  She is currently on multiple medications including Seroquel,  omeprazole, Lurasidone, gabapentin, Pepcid, Cymbalta, and Xanax. Cymbalta has helped with pain, although initial side effects were challenging.  She mentions a high risk of breast cancer and plans to resume regular breast exams following her hysterectomy two years ago. She has not seen her OB-GYN in this period but intends to start regular visits again.  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 She reports feeling tired and stressed, with recent cold symptoms that have resolved.         01/10/2024    7:58 AM 01/10/2024    7:54 AM 10/04/2023    3:27 PM  Depression screen PHQ 2/9  Decreased Interest 0 0 3  Down, Depressed, Hopeless 0 0 3  PHQ - 2 Score 0 0 6  Altered sleeping 0  3  Tired, decreased energy 0  3  Change in appetite 0  1  Feeling bad or failure about yourself  0  3  Trouble concentrating 0  3  Moving slowly or fidgety/restless 0  1  Suicidal thoughts 0  0  PHQ-9 Score 0  20  Difficult doing work/chores Not difficult at all  Extremely dIfficult    Relevant past medical, surgical, family and social history reviewed and updated as indicated. Interim medical history since our last visit reviewed. Allergies and medications reviewed and updated.  Review of Systems  Constitutional: Negative for fever or weight change.  Respiratory: Negative for cough and shortness  of breath.   Cardiovascular: Negative for chest pain or palpitations.  Gastrointestinal: Negative for abdominal pain, no bowel changes.  Musculoskeletal: Negative for gait problem or joint swelling.  Skin: Negative for rash.  Neurological: Negative for dizziness or headache.  No other specific complaints in a complete review of systems (except as listed in HPI above).      Objective:    BP 118/72 (Cuff Size: Large)   Pulse 99   Temp 98.4 F (36.9 C) (Oral)   Resp 16   Ht 5\' 1"  (1.549 m)   Wt 186 lb 8 oz (84.6 kg)   LMP 04/08/2021   SpO2 96%    BMI 35.24 kg/m    Wt Readings from Last 3 Encounters:  01/10/24 186 lb 8 oz (84.6 kg)  12/19/23 190 lb (86.2 kg)  12/15/23 190 lb (86.2 kg)    Physical Exam Physical Exam GENERAL: Alert, cooperative, well developed, no acute distress. HEENT: Normocephalic, normal oropharynx, moist mucous membranes. CHEST: Clear to auscultation bilaterally, no wheezes, rhonchi, or crackles. CARDIOVASCULAR: Normal heart rate and rhythm, S1 and S2 normal without murmurs. ABDOMEN: Soft, non-tender, non-distended, without organomegaly, normal bowel sounds. EXTREMITIES: No cyanosis or edema. NEUROLOGICAL: Cranial nerves grossly intact, moves all extremities without gross motor or sensory deficit.   Results for orders placed or performed during the hospital encounter of 06/19/23  Cervicovaginal ancillary only   Collection Time: 06/19/23  2:24 PM  Result Value Ref Range   Neisseria Gonorrhea Negative    Chlamydia Negative    Trichomonas Negative    Bacterial Vaginitis (gardnerella) Positive (A)    Candida Vaginitis Negative    Candida Glabrata Negative    Comment Normal Reference Range Candida Species - Negative    Comment Normal Reference Range Candida Galbrata - Negative    Comment Normal Reference Range Trichomonas - Negative    Comment Normal Reference Ranger Chlamydia - Negative    Comment      Normal Reference Range Neisseria Gonorrhea - Negative   Comment      Normal Reference Range Bacterial Vaginosis - Negative       Assessment & Rich:   Problem List Items Addressed This Visit       Cardiovascular and Mediastinum   Chronic migraine w/o aura w/o status migrainosus, not intractable - Primary     Respiratory   Moderate persistent asthma   Relevant Medications   budesonide-formoterol (SYMBICORT) 160-4.5 MCG/ACT inhaler   albuterol (VENTOLIN HFA) 108 (90 Base) MCG/ACT inhaler   albuterol (PROVENTIL) (2.5 MG/3ML) 0.083% nebulizer solution     Digestive   Irritable bowel syndrome with  diarrhea   Relevant Medications   famotidine (PEPCID) 20 MG tablet   omeprazole (PRILOSEC) 40 MG capsule     Other   Anxiety   Bipolar 2 disorder (HCC)   Class 1 obesity due to excess calories with serious comorbidity and body mass index (BMI) of 34.0 to 34.9 in adult   Fibromyalgia   Chronic back pain   Attention deficit hyperactivity disorder (ADHD), combined type   Mixed hyperlipidemia   Relevant Orders   Lipid panel   Other Visit Diagnoses       Encounter for hepatitis C screening test for low risk patient       Relevant Orders   Hepatitis C antibody     Screening for diabetes mellitus       Relevant Orders   COMPLETE METABOLIC PANEL WITH GFR   Hemoglobin A1c     Screening  for deficiency anemia       Relevant Orders   CBC with Differential/Platelet     High risk medication use       Relevant Orders   CBC with Differential/Platelet   COMPLETE METABOLIC PANEL WITH GFR   Lipid panel   Hemoglobin A1c     Gastroesophageal reflux disease without esophagitis       Worsening acid reflux.  We will add on Pepcid at night.  Continue taking omeprazole in the a.m.  Continue to avoid triggers.   Relevant Medications   famotidine (PEPCID) 20 MG tablet   omeprazole (PRILOSEC) 40 MG capsule     Chronic GERD       Relevant Medications   famotidine (PEPCID) 20 MG tablet   omeprazole (PRILOSEC) 40 MG capsule        Assessment and Rich Assessment & Rich Migraine   She experienced an 8-day migraine requiring urgent care. Currently on Nurtec as a rescue medication that is effective and covered by insurance. Emgality is newly prescribed but facing insurance coverage issues. The rescue medication is preferred due to minimal sedation, allowing resumption of activities after a short rest.   - Continue Nurtec for migraine  rescue medication for acute migraine attacks   - Contact insurance regarding Emgality coverage   - Schedule follow-up with neurologist in approximately 5 months     Asthma   Asthma is well-controlled with albuterol. She uses albuterol as needed and reports doing well. Symbicort was previously effective and is covered by insurance, unlike [employer].   - Prescribe Symbicort for asthma management   - Refill albuterol inhaler   - Refill nebulizer medication    Bipolar Disorder and Anxiety   She is on Seroquel, Lurasidone, and Xanax. Her psychiatrist prescribed Cymbalta, which aids in pain management. Initial side effects were noted, but continuation was advised as they may subside. Cymbalta benefits mood and nerve pain.   - Continue Seroquel, Lurasidone, and Xanax as needed   - Continue Cymbalta and monitor for side effects   -managed by psychiatry   Irritable Bowel Syndrome and Hemorrhoids /GERD She reports a large hemorrhoid, worsened recently, and has not followed up with GI due to COVID-19 concerns. The hemorrhoid has been present since childbirth.   - Schedule follow-up with GI for IBS and hemorrhoid management   - Refill Pepcid and omeprazole  Vaginal Swelling and pH Imbalance   Vaginal swelling, initially suspected as prolapse, is attributed to ovulation and previous BV episodes. She uses boric acid post-intercourse for pH imbalance. Swelling is expected to resolve, but follow-up is planned if symptoms persist.   - Continue boric acid treatment post-intercourse   - Follow up with OB/GYN in April if symptoms persist    Fibromyalgia/chronic back pain -managed by pain management -currently on gabapentin 400 mg at bedtime  -recently started cymbalta  HLD/obesity -working on lifestyle modification,  getting labs today  General Health Maintenance   She is due for lab work and has a high risk of breast cancer, necessitating regular breast exams. She has not had a yearly exam since her hysterectomy two years ago.   - Perform lab work   - Schedule regular breast exams due to high risk of breast cancer   - Encourage yearly OB/GYN exams         [name]: Return in about 6 months (around 07/12/2024).

## 2024-01-10 ENCOUNTER — Ambulatory Visit (INDEPENDENT_AMBULATORY_CARE_PROVIDER_SITE_OTHER): Payer: Medicaid Other | Admitting: Nurse Practitioner

## 2024-01-10 ENCOUNTER — Other Ambulatory Visit: Payer: Self-pay

## 2024-01-10 ENCOUNTER — Encounter: Payer: Self-pay | Admitting: Nurse Practitioner

## 2024-01-10 VITALS — BP 118/72 | HR 99 | Temp 98.4°F | Resp 16 | Ht 61.0 in | Wt 186.5 lb

## 2024-01-10 DIAGNOSIS — F419 Anxiety disorder, unspecified: Secondary | ICD-10-CM | POA: Diagnosis not present

## 2024-01-10 DIAGNOSIS — Z79899 Other long term (current) drug therapy: Secondary | ICD-10-CM

## 2024-01-10 DIAGNOSIS — G43709 Chronic migraine without aura, not intractable, without status migrainosus: Secondary | ICD-10-CM

## 2024-01-10 DIAGNOSIS — K219 Gastro-esophageal reflux disease without esophagitis: Secondary | ICD-10-CM

## 2024-01-10 DIAGNOSIS — G8929 Other chronic pain: Secondary | ICD-10-CM

## 2024-01-10 DIAGNOSIS — E66811 Obesity, class 1: Secondary | ICD-10-CM

## 2024-01-10 DIAGNOSIS — Z131 Encounter for screening for diabetes mellitus: Secondary | ICD-10-CM

## 2024-01-10 DIAGNOSIS — E782 Mixed hyperlipidemia: Secondary | ICD-10-CM

## 2024-01-10 DIAGNOSIS — F3181 Bipolar II disorder: Secondary | ICD-10-CM | POA: Diagnosis not present

## 2024-01-10 DIAGNOSIS — J4540 Moderate persistent asthma, uncomplicated: Secondary | ICD-10-CM | POA: Diagnosis not present

## 2024-01-10 DIAGNOSIS — Z130 Encounter for screening for diseases of the blood and blood-forming organs and certain disorders involving the immune mechanism: Secondary | ICD-10-CM | POA: Diagnosis not present

## 2024-01-10 DIAGNOSIS — F902 Attention-deficit hyperactivity disorder, combined type: Secondary | ICD-10-CM

## 2024-01-10 DIAGNOSIS — E6609 Other obesity due to excess calories: Secondary | ICD-10-CM

## 2024-01-10 DIAGNOSIS — M549 Dorsalgia, unspecified: Secondary | ICD-10-CM | POA: Diagnosis not present

## 2024-01-10 DIAGNOSIS — K580 Irritable bowel syndrome with diarrhea: Secondary | ICD-10-CM

## 2024-01-10 DIAGNOSIS — M797 Fibromyalgia: Secondary | ICD-10-CM | POA: Diagnosis not present

## 2024-01-10 DIAGNOSIS — Z1159 Encounter for screening for other viral diseases: Secondary | ICD-10-CM | POA: Diagnosis not present

## 2024-01-10 MED ORDER — BUDESONIDE-FORMOTEROL FUMARATE 160-4.5 MCG/ACT IN AERO: 2.0000 | INHALATION_SPRAY | Freq: Two times a day (BID) | RESPIRATORY_TRACT | 5 refills | Status: DC

## 2024-01-10 MED ORDER — OMEPRAZOLE 40 MG PO CPDR: 40.0000 mg | DELAYED_RELEASE_CAPSULE | Freq: Two times a day (BID) | ORAL | 1 refills | Status: DC

## 2024-01-10 MED ORDER — ALBUTEROL SULFATE (2.5 MG/3ML) 0.083% IN NEBU: 2.5000 mg | INHALATION_SOLUTION | Freq: Four times a day (QID) | RESPIRATORY_TRACT | 12 refills | Status: AC | PRN

## 2024-01-10 MED ORDER — FAMOTIDINE 20 MG PO TABS: 20.0000 mg | ORAL_TABLET | Freq: Two times a day (BID) | ORAL | 1 refills | Status: DC

## 2024-01-10 MED ORDER — ALBUTEROL SULFATE HFA 108 (90 BASE) MCG/ACT IN AERS: 1.0000 | INHALATION_SPRAY | Freq: Four times a day (QID) | RESPIRATORY_TRACT | 3 refills | Status: DC | PRN

## 2024-01-11 LAB — COMPLETE METABOLIC PANEL WITH GFR
AG Ratio: 1.4 (calc) (ref 1.0–2.5)
ALT: 20 U/L (ref 6–29)
AST: 16 U/L (ref 10–30)
Albumin: 4.2 g/dL (ref 3.6–5.1)
Alkaline phosphatase (APISO): 76 U/L (ref 31–125)
BUN: 8 mg/dL (ref 7–25)
CO2: 29 mmol/L (ref 20–32)
Calcium: 9.6 mg/dL (ref 8.6–10.2)
Chloride: 99 mmol/L (ref 98–110)
Creat: 0.68 mg/dL (ref 0.50–0.96)
Globulin: 3.1 g/dL (ref 1.9–3.7)
Glucose, Bld: 85 mg/dL (ref 65–99)
Potassium: 4.4 mmol/L (ref 3.5–5.3)
Sodium: 136 mmol/L (ref 135–146)
Total Bilirubin: 0.5 mg/dL (ref 0.2–1.2)
Total Protein: 7.3 g/dL (ref 6.1–8.1)

## 2024-01-11 LAB — HEMOGLOBIN A1C
Hgb A1c MFr Bld: 5.1 %{Hb} (ref <5.7)
Mean Plasma Glucose: 100 mg/dL
eAG (mmol/L): 5.5 mmol/L

## 2024-01-11 LAB — LIPID PANEL
Cholesterol: 218 mg/dL — ABNORMAL HIGH (ref <200)
HDL: 65 mg/dL (ref >=50)
LDL Cholesterol (Calc): 132 mg/dL — ABNORMAL HIGH
Non-HDL Cholesterol (Calc): 153 mg/dL — ABNORMAL HIGH (ref <130)
Total CHOL/HDL Ratio: 3.4 (calc) (ref <5.0)
Triglycerides: 107 mg/dL (ref <150)

## 2024-01-11 LAB — CBC WITH DIFFERENTIAL/PLATELET
Absolute Lymphocytes: 3172 {cells}/uL (ref 850–3900)
Absolute Monocytes: 390 {cells}/uL (ref 200–950)
Basophils Absolute: 20 {cells}/uL (ref 0–200)
Basophils Relative: 0.3 %
Eosinophils Absolute: 189 {cells}/uL (ref 15–500)
Eosinophils Relative: 2.9 %
HCT: 43.2 % (ref 35.0–45.0)
Hemoglobin: 14.3 g/dL (ref 11.7–15.5)
MCH: 28.3 pg (ref 27.0–33.0)
MCHC: 33.1 g/dL (ref 32.0–36.0)
MCV: 85.5 fL (ref 80.0–100.0)
MPV: 9.4 fL (ref 7.5–12.5)
Monocytes Relative: 6 %
Neutro Abs: 2730 {cells}/uL (ref 1500–7800)
Neutrophils Relative %: 42 %
Platelets: 370 10*3/uL (ref 140–400)
RBC: 5.05 10*6/uL (ref 3.80–5.10)
RDW: 12.3 % (ref 11.0–15.0)
Total Lymphocyte: 48.8 %
WBC: 6.5 10*3/uL (ref 3.8–10.8)

## 2024-01-11 LAB — HEPATITIS C ANTIBODY: Hepatitis C Ab: NONREACTIVE

## 2024-01-12 ENCOUNTER — Encounter: Payer: Self-pay | Admitting: Nurse Practitioner

## 2024-01-19 DIAGNOSIS — F330 Major depressive disorder, recurrent, mild: Secondary | ICD-10-CM | POA: Diagnosis not present

## 2024-01-19 DIAGNOSIS — F411 Generalized anxiety disorder: Secondary | ICD-10-CM | POA: Diagnosis not present

## 2024-01-26 DIAGNOSIS — F411 Generalized anxiety disorder: Secondary | ICD-10-CM | POA: Diagnosis not present

## 2024-01-29 DIAGNOSIS — Z419 Encounter for procedure for purposes other than remedying health state, unspecified: Secondary | ICD-10-CM | POA: Diagnosis not present

## 2024-02-02 DIAGNOSIS — F330 Major depressive disorder, recurrent, mild: Secondary | ICD-10-CM | POA: Diagnosis not present

## 2024-02-02 DIAGNOSIS — F411 Generalized anxiety disorder: Secondary | ICD-10-CM | POA: Diagnosis not present

## 2024-02-18 DIAGNOSIS — F330 Major depressive disorder, recurrent, mild: Secondary | ICD-10-CM | POA: Diagnosis not present

## 2024-02-18 DIAGNOSIS — F411 Generalized anxiety disorder: Secondary | ICD-10-CM | POA: Diagnosis not present

## 2024-02-23 DIAGNOSIS — F330 Major depressive disorder, recurrent, mild: Secondary | ICD-10-CM | POA: Diagnosis not present

## 2024-02-23 DIAGNOSIS — F411 Generalized anxiety disorder: Secondary | ICD-10-CM | POA: Diagnosis not present

## 2024-02-28 DIAGNOSIS — Z419 Encounter for procedure for purposes other than remedying health state, unspecified: Secondary | ICD-10-CM | POA: Diagnosis not present

## 2024-03-01 DIAGNOSIS — F330 Major depressive disorder, recurrent, mild: Secondary | ICD-10-CM | POA: Diagnosis not present

## 2024-03-01 DIAGNOSIS — F411 Generalized anxiety disorder: Secondary | ICD-10-CM | POA: Diagnosis not present

## 2024-03-08 DIAGNOSIS — F330 Major depressive disorder, recurrent, mild: Secondary | ICD-10-CM | POA: Diagnosis not present

## 2024-03-08 DIAGNOSIS — F411 Generalized anxiety disorder: Secondary | ICD-10-CM | POA: Diagnosis not present

## 2024-03-17 DIAGNOSIS — F411 Generalized anxiety disorder: Secondary | ICD-10-CM | POA: Diagnosis not present

## 2024-03-17 DIAGNOSIS — F330 Major depressive disorder, recurrent, mild: Secondary | ICD-10-CM | POA: Diagnosis not present

## 2024-03-24 DIAGNOSIS — F330 Major depressive disorder, recurrent, mild: Secondary | ICD-10-CM | POA: Diagnosis not present

## 2024-03-24 DIAGNOSIS — F411 Generalized anxiety disorder: Secondary | ICD-10-CM | POA: Diagnosis not present

## 2024-03-30 DIAGNOSIS — Z419 Encounter for procedure for purposes other than remedying health state, unspecified: Secondary | ICD-10-CM | POA: Diagnosis not present

## 2024-03-30 DIAGNOSIS — F411 Generalized anxiety disorder: Secondary | ICD-10-CM | POA: Diagnosis not present

## 2024-03-30 DIAGNOSIS — F330 Major depressive disorder, recurrent, mild: Secondary | ICD-10-CM | POA: Diagnosis not present

## 2024-04-03 DIAGNOSIS — Z79899 Other long term (current) drug therapy: Secondary | ICD-10-CM | POA: Diagnosis not present

## 2024-04-03 DIAGNOSIS — Z79891 Long term (current) use of opiate analgesic: Secondary | ICD-10-CM | POA: Diagnosis not present

## 2024-04-05 DIAGNOSIS — F411 Generalized anxiety disorder: Secondary | ICD-10-CM | POA: Diagnosis not present

## 2024-04-05 DIAGNOSIS — F330 Major depressive disorder, recurrent, mild: Secondary | ICD-10-CM | POA: Diagnosis not present

## 2024-04-12 DIAGNOSIS — F330 Major depressive disorder, recurrent, mild: Secondary | ICD-10-CM | POA: Diagnosis not present

## 2024-04-12 DIAGNOSIS — F411 Generalized anxiety disorder: Secondary | ICD-10-CM | POA: Diagnosis not present

## 2024-04-19 DIAGNOSIS — F4312 Post-traumatic stress disorder, chronic: Secondary | ICD-10-CM | POA: Diagnosis not present

## 2024-04-26 DIAGNOSIS — M9914 Subluxation complex (vertebral) of sacral region: Secondary | ICD-10-CM | POA: Diagnosis not present

## 2024-04-26 DIAGNOSIS — M9915 Subluxation complex (vertebral) of pelvic region: Secondary | ICD-10-CM | POA: Diagnosis not present

## 2024-04-26 DIAGNOSIS — M9913 Subluxation complex (vertebral) of lumbar region: Secondary | ICD-10-CM | POA: Diagnosis not present

## 2024-04-29 DIAGNOSIS — Z419 Encounter for procedure for purposes other than remedying health state, unspecified: Secondary | ICD-10-CM | POA: Diagnosis not present

## 2024-05-10 DIAGNOSIS — F330 Major depressive disorder, recurrent, mild: Secondary | ICD-10-CM | POA: Diagnosis not present

## 2024-05-10 DIAGNOSIS — F411 Generalized anxiety disorder: Secondary | ICD-10-CM | POA: Diagnosis not present

## 2024-05-18 DIAGNOSIS — F330 Major depressive disorder, recurrent, mild: Secondary | ICD-10-CM | POA: Diagnosis not present

## 2024-05-30 DIAGNOSIS — Z419 Encounter for procedure for purposes other than remedying health state, unspecified: Secondary | ICD-10-CM | POA: Diagnosis not present

## 2024-06-01 DIAGNOSIS — F330 Major depressive disorder, recurrent, mild: Secondary | ICD-10-CM | POA: Diagnosis not present

## 2024-06-10 ENCOUNTER — Telehealth: Admitting: Nurse Practitioner

## 2024-06-10 DIAGNOSIS — H6501 Acute serous otitis media, right ear: Secondary | ICD-10-CM | POA: Diagnosis not present

## 2024-06-10 MED ORDER — SULFAMETHOXAZOLE-TRIMETHOPRIM 800-160 MG PO TABS: 1.0000 | ORAL_TABLET | Freq: Two times a day (BID) | ORAL | 0 refills | Status: AC

## 2024-06-10 NOTE — Progress Notes (Signed)
 Virtual Visit Consent   Janet Rich, you are scheduled for a virtual visit with a [facility] provider today. Just as with appointments in the office, your consent must be obtained to participate. Your consent will be active for this visit and any virtual visit you may have with one of our providers in the next 365 days. If you have a MyChart account, a copy of this consent can be sent to you electronically.  As this is a virtual visit, video technology does not allow for your provider to perform a traditional examination. This may limit your provider's ability to fully assess your condition. If your provider identifies any concerns that need to be evaluated in person or the need to arrange testing (such as labs, EKG, etc.), we will make arrangements to do so. Although advances in technology are sophisticated, we cannot ensure that it will always work on either your end or our end. If the connection with a video visit is poor, the visit may have to be switched to a telephone visit. With either a video or telephone visit, we are not always able to ensure that we have a secure connection.  By engaging in this virtual visit, you consent to the provision of healthcare and authorize for your insurance to be billed (if applicable) for the services provided during this visit. Depending on your insurance coverage, you may receive a charge related to this service.  I need to obtain your verbal consent now. Are you willing to proceed with your visit today? Janet Rich has provided verbal consent on 06/10/2024 for a virtual visit (video or telephone). Haze LELON Servant, NP  Date: 06/10/2024 8:58 AM   Virtual Visit via Video Note   I, Haze LELON Servant, connected with  Janet Rich  ([id number], 1996/08/13) on 06/10/24 at  9:00 AM EDT by a video-enabled telemedicine application and verified that I am speaking with the correct person using two identifiers.  Location: Patient: Virtual Visit Location Patient:  Home Provider: Virtual Visit Location Provider: Home Office   I discussed the limitations of evaluation and management by telemedicine and the availability of in person appointments. The patient expressed understanding and agreed to proceed.    History of Present Illness: Janet Rich is a 28 y.o. who identifies as a female who was assigned female at birth, and is being seen today for right otitis media. For the past several days Janet Rich has been experiencing painful right ear. Right sided headache and dizziness/vertigo. She denies any purulent drainage from the ear or any URI symptoms.    Problems:  Patient Active Problem List   Diagnosis Date Noted   Mixed hyperlipidemia 07/12/2023   Attention deficit hyperactivity disorder (ADHD), combined type 01/08/2023   Chronic migraine w/o aura w/o status migrainosus, not intractable 07/30/2022   Bulging of lumbar intervertebral disc 06/16/2022   Fibromyalgia 03/09/2022   Chronic back pain 03/09/2022   Right trigeminal neuralgia 11/08/2019   Endometriosis 03/27/2019   PCOS (polycystic ovarian syndrome) 03/27/2019   Moderate persistent asthma 03/07/2019   Irritable bowel syndrome with diarrhea 03/07/2019   Class 1 obesity due to excess calories with serious comorbidity and body mass index (BMI) of 34.0 to 34.9 in adult 09/02/2018   Bipolar 2 disorder (HCC) 08/12/2018   Vulvar atrophy 03/11/2018   Anxiety 05/01/2015    Allergies:  Allergies  Allergen Reactions   Amoxicillin  Shortness Of Breath and Nausea Only    Dizziness   Other     Must have anti-emetic  prior to being given any narcotics    Latex Rash and Other (See Comments)   Medications:  Current Outpatient Medications:    sulfamethoxazole -trimethoprim  (BACTRIM  DS) 800-160 MG tablet, Take 1 tablet by mouth 2 (two) times daily for 10 days., Disp: 20 tablet, Rfl: 0   acetaminophen  (TYLENOL ) 500 MG tablet, Take 2 tablets (1,000 mg total) by mouth every 6 (six) hours as needed for  moderate pain., Disp: 30 tablet, Rfl: 0   albuterol  (PROVENTIL ) (2.5 MG/3ML) 0.083% nebulizer solution, Take 3 mLs (2.5 mg total) by nebulization every 6 (six) hours as needed for wheezing or shortness of breath., Disp: 75 mL, Rfl: 12   albuterol  (VENTOLIN  HFA) 108 (90 Base) MCG/ACT inhaler, Inhale 1-2 puffs into the lungs every 6 (six) hours as needed for wheezing or shortness of breath., Disp: 1 each, Rfl: 3   ALPRAZolam  (XANAX ) 0.5 MG tablet, Take 0.5 mg by mouth 2 (two) times daily as needed., Disp: , Rfl:    Ascorbic Acid (VITAMIN C) 100 MG tablet, Take 100 mg by mouth daily., Disp: , Rfl:    BORIC ACID VAGINAL VA, Place vaginally., Disp: , Rfl:    budesonide -formoterol  (SYMBICORT ) 160-4.5 MCG/ACT inhaler, Inhale 2 puffs into the lungs 2 (two) times daily., Disp: 1 each, Rfl: 5   cholecalciferol (VITAMIN D3) 25 MCG (1000 UNIT) tablet, Take 1,000 Units by mouth daily., Disp: , Rfl:    cyclobenzaprine  (FLEXERIL ) 10 MG tablet, Take 10 mg by mouth at bedtime., Disp: , Rfl:    Dextroamphetamine Sulfate 20 MG TABS, Take 40 mg by mouth as directed., Disp: , Rfl:    DULoxetine (CYMBALTA) 30 MG capsule, Take 30 mg by mouth daily., Disp: , Rfl:    famotidine  (PEPCID ) 20 MG tablet, Take 1 tablet (20 mg total) by mouth 2 (two) times daily., Disp: 180 tablet, Rfl: 1   gabapentin  (NEURONTIN ) 100 MG capsule, Take 4 capsules (400 mg total) by mouth at bedtime., Disp: 140 capsule, Rfl: 4   Galcanezumab -gnlm (EMGALITY ) 120 MG/ML SOAJ, Inject 120 mg into the skin every 30 (thirty) days., Disp: 1.12 mL, Rfl: 11   Galcanezumab -gnlm (EMGALITY ) 120 MG/ML SOAJ, Inject 240 mg into the skin every 30 (thirty) days., Disp: 2 mL, Rfl: 0   ibuprofen  (ADVIL ) 800 MG tablet, Take 1 tablet (800 mg total) by mouth every 8 (eight) hours as needed., Disp: 30 tablet, Rfl: 2   lamoTRIgine  (LAMICTAL ) 200 MG tablet, Take 100 mg by mouth 2 (two) times daily., Disp: , Rfl:    Lurasidone  HCl 60 MG TABS, Take 60 mg by mouth every  evening., Disp: , Rfl:    MAGNESIUM  MALATE PO, Take 1 tablet by mouth daily at 6 (six) AM., Disp: , Rfl:    Multiple Vitamin (MULTIVITAMIN ADULT PO), Take 1 tablet by mouth daily., Disp: , Rfl:    omeprazole  (PRILOSEC) 40 MG capsule, Take 1 capsule (40 mg total) by mouth 2 (two) times daily before a meal., Disp: 60 capsule, Rfl: 1   ondansetron  (ZOFRAN -ODT) 4 MG disintegrating tablet, Take 1 tablet (4 mg total) by mouth every 8 (eight) hours as needed for nausea or vomiting., Disp: 20 tablet, Rfl: 1   QUEtiapine (SEROQUEL) 25 MG tablet, Take 25 mg by mouth at bedtime., Disp: , Rfl:    Rimegepant Sulfate (NURTEC) 75 MG TBDP, Take 1 tablet (75 mg total) by mouth as needed., Disp: 8 tablet, Rfl: 11   vitamin B-12 (CYANOCOBALAMIN ) 500 MCG tablet, Take 500 mcg by mouth daily., Disp: , Rfl:  Observations/Objective: Patient is well-developed, well-nourished in no acute distress.  Resting comfortably at home.  Head is normocephalic, atraumatic.  No labored breathing.  Speech is clear and coherent with logical content.  Patient is alert and oriented at baseline.    Assessment and Plan: 1. Non-recurrent acute serous otitis media of right ear (Primary) - sulfamethoxazole -trimethoprim  (BACTRIM  DS) 800-160 MG tablet; Take 1 tablet by mouth 2 (two) times daily for 10 days.  Dispense: 20 tablet; Refill: 0  May take tylenol  and motrin  for ear pain  Follow Up Instructions: I discussed the assessment and treatment plan with the patient. The patient was provided an opportunity to ask questions and all were answered. The patient agreed with the plan and demonstrated an understanding of the instructions.  A copy of instructions were sent to the patient via MyChart unless otherwise noted below.   The patient was advised to call back or seek an in-person evaluation if the symptoms worsen or if the condition fails to improve as anticipated.    Janet Rich W Jahayra Mazo, NP

## 2024-06-10 NOTE — Patient Instructions (Addendum)
 Janet Rich, thank you for joining Haze LELON Servant, NP for today's virtual visit.  While this provider is not your primary care provider (PCP), if your PCP is located in our provider database this encounter information will be shared with them immediately following your visit.   A [facility] MyChart account gives you access to today's visit and all your visits, tests, and labs performed at [facility]  click here if you don't have a [facility] MyChart account or go to [email or web address]  Consent: (Patient) Janet Rich provided verbal consent for this virtual visit at the beginning of the encounter.  Current Medications:  Current Outpatient Medications:    sulfamethoxazole -trimethoprim  (BACTRIM  DS) 800-160 MG tablet, Take 1 tablet by mouth 2 (two) times daily for 10 days., Disp: 20 tablet, Rfl: 0   acetaminophen  (TYLENOL ) 500 MG tablet, Take 2 tablets (1,000 mg total) by mouth every 6 (six) hours as needed for moderate pain., Disp: 30 tablet, Rfl: 0   albuterol  (PROVENTIL ) (2.5 MG/3ML) 0.083% nebulizer solution, Take 3 mLs (2.5 mg total) by nebulization every 6 (six) hours as needed for wheezing or shortness of breath., Disp: 75 mL, Rfl: 12   albuterol  (VENTOLIN  HFA) 108 (90 Base) MCG/ACT inhaler, Inhale 1-2 puffs into the lungs every 6 (six) hours as needed for wheezing or shortness of breath., Disp: 1 each, Rfl: 3   ALPRAZolam  (XANAX ) 0.5 MG tablet, Take 0.5 mg by mouth 2 (two) times daily as needed., Disp: , Rfl:    Ascorbic Acid (VITAMIN C) 100 MG tablet, Take 100 mg by mouth daily., Disp: , Rfl:    BORIC ACID VAGINAL VA, Place vaginally., Disp: , Rfl:    budesonide -formoterol  (SYMBICORT ) 160-4.5 MCG/ACT inhaler, Inhale 2 puffs into the lungs 2 (two) times daily., Disp: 1 each, Rfl: 5   cholecalciferol (VITAMIN D3) 25 MCG (1000 UNIT) tablet, Take 1,000 Units by mouth daily., Disp: , Rfl:    cyclobenzaprine  (FLEXERIL ) 10 MG tablet, Take 10 mg by mouth at bedtime.,  Disp: , Rfl:    Dextroamphetamine Sulfate 20 MG TABS, Take 40 mg by mouth as directed., Disp: , Rfl:    DULoxetine (CYMBALTA) 30 MG capsule, Take 30 mg by mouth daily., Disp: , Rfl:    famotidine  (PEPCID ) 20 MG tablet, Take 1 tablet (20 mg total) by mouth 2 (two) times daily., Disp: 180 tablet, Rfl: 1   gabapentin  (NEURONTIN ) 100 MG capsule, Take 4 capsules (400 mg total) by mouth at bedtime., Disp: 140 capsule, Rfl: 4   Galcanezumab -gnlm (EMGALITY ) 120 MG/ML SOAJ, Inject 120 mg into the skin every 30 (thirty) days., Disp: 1.12 mL, Rfl: 11   Galcanezumab -gnlm (EMGALITY ) 120 MG/ML SOAJ, Inject 240 mg into the skin every 30 (thirty) days., Disp: 2 mL, Rfl: 0   ibuprofen  (ADVIL ) 800 MG tablet, Take 1 tablet (800 mg total) by mouth every 8 (eight) hours as needed., Disp: 30 tablet, Rfl: 2   lamoTRIgine  (LAMICTAL ) 200 MG tablet, Take 100 mg by mouth 2 (two) times daily., Disp: , Rfl:    Lurasidone  HCl 60 MG TABS, Take 60 mg by mouth every evening., Disp: , Rfl:    MAGNESIUM  MALATE PO, Take 1 tablet by mouth daily at 6 (six) AM., Disp: , Rfl:    Multiple Vitamin (MULTIVITAMIN ADULT PO), Take 1 tablet by mouth daily., Disp: , Rfl:    omeprazole  (PRILOSEC) 40 MG capsule, Take 1 capsule (40 mg total) by mouth 2 (two) times daily before a meal., Disp: 60 capsule, Rfl: 1  ondansetron  (ZOFRAN -ODT) 4 MG disintegrating tablet, Take 1 tablet (4 mg total) by mouth every 8 (eight) hours as needed for nausea or vomiting., Disp: 20 tablet, Rfl: 1   QUEtiapine (SEROQUEL) 25 MG tablet, Take 25 mg by mouth at bedtime., Disp: , Rfl:    Rimegepant Sulfate (NURTEC) 75 MG TBDP, Take 1 tablet (75 mg total) by mouth as needed., Disp: 8 tablet, Rfl: 11   vitamin B-12 (CYANOCOBALAMIN ) 500 MCG tablet, Take 500 mcg by mouth daily., Disp: , Rfl:    Medications ordered in this encounter:  Meds ordered this encounter  Medications   sulfamethoxazole -trimethoprim  (BACTRIM  DS) 800-160 MG tablet    Sig: Take 1 tablet by mouth 2  (two) times daily for 10 days.    Dispense:  20 tablet    Refill:  0    Supervising Provider:   LAMPTEY, PHILIP O [[id number]]     *If you need refills on other medications prior to your next appointment, please contact your pharmacy*  Follow-Up: Call back or seek an in-person evaluation if the symptoms worsen or if the condition fails to improve as anticipated.  [facility] [phone]  Other Instructions May take tylenol  and motrin  for ear pain   If you have been instructed to have an in-person evaluation today at a local Urgent Care facility, please use the link below. It will take you to a list of all of our available [facility], including address, phone number and hours of operation. Please do not delay care.  [facility]  If you or a family member do not have a primary care provider, use the link below to schedule a visit and establish care. When you choose a [facility] primary care physician or advanced practice provider, you gain a long-term partner in health. Find a Primary Care Provider  Learn more about [facility]'s in-office and virtual care options: [facility] - Get Care Now

## 2024-06-14 ENCOUNTER — Telehealth: Payer: Medicaid Other | Admitting: Neurology

## 2024-06-14 DIAGNOSIS — F319 Bipolar disorder, unspecified: Secondary | ICD-10-CM | POA: Diagnosis not present

## 2024-06-14 DIAGNOSIS — F419 Anxiety disorder, unspecified: Secondary | ICD-10-CM

## 2024-06-14 DIAGNOSIS — G43709 Chronic migraine without aura, not intractable, without status migrainosus: Secondary | ICD-10-CM | POA: Diagnosis not present

## 2024-06-14 MED ORDER — NURTEC 75 MG PO TBDP: 75.0000 mg | ORAL_TABLET | ORAL | 11 refills | Status: AC

## 2024-06-14 NOTE — Progress Notes (Signed)
 Patient: Janet Rich Date of Birth: 1996-05-23  Reason for Visit: Follow up History from: Patient Primary Neurologist: Ines  Virtual Visit via Video Note  I connected with [name] on 06/14/24 at  2:45 PM EDT by a video enabled telemedicine application and verified that I am speaking with the correct person using two identifiers.  Location: Patient: at her home Provider: in the office    I discussed the limitations of evaluation and management by telemedicine and the availability of in person appointments. The patient expressed understanding and agreed to proceed.  ASSESSMENT AND PLAN 28 y.o. year old female   1.  Chronic migraine headache 2.  Bipolar disorder, anxiety, depression  - Change Nurtec 75 mg tablet every other day for migraine prevention - Use ibuprofen  for acute headache treatment - Limit OTC medication to no mor ethan 2-3 days a week to watch for rebound headache   -Recommend drink plenty of water, get adequate rest, healthy eating, adequate sleep -Tried and failed: Comprehensive list as below but includes Topamax , propranolol , Lamictal , Imitrex , Maxalt , antidepressants contraindicated due to suicidal ideation -Next steps: Mickel Newer, Botox, Emgality   -Follow-up in 6 months or sooner if needed  HISTORY OF PRESENT ILLNESS: Today 06/14/24 Update 06/14/24 SS: Didn't start the Emgality . During the summer she has hardly had any mograines. Right now dealing with 4 day migraine, school started last week in last semester. taking Excedrin headache often during days of headache. Nurtec works great, but is out of it. Lately going through mental health issue, adjusting medication, worries about starting the CGRP shot, side effects right now. No side effect from Nurtec, able to take right at onset of migraine.   12/15/23 SS: At last visit we started Topamax  50 mg at bedtime. Took it through October, then no showed her appointment and she ran out. It wasn't helping.  Currently not on any preventative, currently having 1-2 a month, can last a few days. Has migraine today that has lasted 8 days. Is starting to ease. Went to urgent care 2/24, given toradol , steroid, zofran  injection. Didn't help much. Sleeping, resting is working best now. Has 3 kids, is busy, a lot of stress, is in school getting her bachelor's degree in english online. Has daily headache a month, 8-10 days a month of migraine. Her car is not working, her husband missed work to bring her. Currently migraine is bilateral retro orbital. Taking OTC Tylenol  and Ibuprofen .   Copied Dr. Ines: From a thorough review of records: Medications tried that can be used in migraine management include: Tylenol , amitriptyline , aspirin, Flexeril , Decadron , Benadryl , diclofenac  tablets, gabapentin , ibuprofen , Tylenol , Aleve , ketorolac  injections, Lamictal , lidocaine  injections, meclizine , Mobic tablets, methylprednisolone , Reglan  tablets, Mobic, nifedipine  (calcium channel blocker similar to verapamil), propranolol  contraindicated due to asthma, Zofran , prednisone  tablets, Compazine  injections, Phenergan  tablets and suppositories and injections, Seroquel, Zoloft , Imitrex , tramadol ,worse with standing and changing positions.   01/19/23 SS: Migraines are doing awful, hasn't started Emgality , it was denied, we appealed but she didn't sign the letter, wanted to wait to see if her insurance changed. Not sure if her Medicaid would be continued, will know at the end of the month. Right now 2-3 migraines a week. Takes ibuprofen  at night to go to sleep. Under a lot of stress. Finishing up her bachelor's in literary studies then go into [employer]. Migraines are frontal, has n/v, sensitivity to light sound. Imitrex  makes her sleepy, nauseated. Her PCP gave her a few phenergan  tablets. She had hysterectomy.  HISTORY  July 30, 2022 SS: Here today for follow-up virtually.  Continues to struggle with frequent migraine headache.   Often with dizziness.  Reports daily headache, 2-3 migraines a week.  Tried prescription strength 800 mg ibuprofen , Excedrin Migraine.  In the remote past has taken Imitrex  25 mg with good effect, but made her sleepy.  Current migraines reports dizziniess, sensitivity to light, sound, nausea.  She is a stay-at-home mom, has a 24-year-old, set of almost  44-year-old twins.  Has been on few rounds of prednisone , would like to avoid if possible. In March 2023 MRI of the brain with and without contrast with IAC was normal.    12/05/21 Dr. Ines HPI:  Janet Rich is a 28 y.o. female here as requested by Gareth Mliss FALCON, FNP for new chief complaint migraines with vertigo.  I saw patient in 2021 for different symptoms Right sided facial pain, she has a past medical history of PCOS, depression, headache, back pain, bipolar 2, anxiety and trigeminal neuralgia, myalgia, elevated blood pressure without diagnosis of hypertension, preeclampsia and preterm labor with twins, IBS, GERD, obesity BMI 38, migraines, anxiety and depression.  At the time I saw her she was having some shooting pain into the proximal jaw on and off.  At that time she told us  that she had shooting pain that radiated in front of the ear did not really shoot in the face just kind of hurt in front of the ear brief and severe but then pain lingers, she saw an ENT ongoing for several years, sensitive to touch ongoing daily, at the time she was [redacted] weeks pregnant with twins and she reported decreased hearing on the right side.  Thorough neurologic and physical examination were normal.  We ordered an MRI of the brain and started a Medrol  Dosepak at the time, we referred to ENT, patient did not follow-up with us .   She was referred here by Dr. Gareth and I reviewed her notes from November 10, 2021, patient reported she has had vertigo for a week, migraines/headaches, especially in the morning, when she moves her head she gets dizzy, she had this in the past and  was seen at ENT and taught the Epley maneuvers, at that point she was referred to neurology and had an MRI as above, she was supposed to return for an MRI with contrast after the birth of her girls but she did not, she says she takes ibuprofen  for migraines, it usually works for her, discussed that there are other treatments for migraine she may want to look into and she agreed to come to neurology.   Patient has a lot of dizziness, positional headaches and migraines, vision changes, can't bend over or move fast makes it worse, room is spinnng, worse in the morning when supine with headaches, she has not been performing her epley maneuvers. She is dizzy every day. Vertigo has been severe. She had vertigo In the past but nothing like this. She is having vision changes, recommended eye doctor (floaters), she has headaches, pulsating/pounding/throbbing with nausea and light sensitivity, her ear on the right is painful, started with an inner ear infection treated with antibiotics and ear drops. Also having daily chronic headaches and migraines for the last month but usually has a lot of migraines the month. Prior to this only 3 days a month of migraines. She drinks 3 bottles water a day. No weakness. No other focal neurologic deficits, associated symptoms, inciting events or modifiable factors.   Reviewed  notes, labs and imaging from outside physicians, which showed:   09/2021: BUN 10, creat 0.59   MRI brain 05/16/2020: MRI brain (without) demonstrating: personally reviewed imaging and agree - Tiny vessels noted near the right trigeminal nerve cisternal segment.  - Brain parenchyma is otherwise unremarkable.  Personally reviewed and agree.     From a thorough review of records: Medications tried that can be used in migraine management include: Tylenol , amitriptyline , aspirin, Flexeril , Decadron , Benadryl , diclofenac  tablets, gabapentin , ibuprofen , Tylenol , Aleve , ketorolac  injections, Lamictal , lidocaine   injections, meclizine , Mobic tablets, methylprednisolone , Reglan  tablets, Mobic, nifedipine  (calcium channel blocker similar to verapamil), propranolol  contraindicated due to asthma, Zofran , prednisone  tablets, Compazine  injections, Phenergan  tablets and suppositories and injections, Seroquel, Zoloft , Imitrex , tramadol ,worse with standing and changing positions.  REVIEW OF SYSTEMS: Out of a complete 14 system review of symptoms, the patient complains only of the following symptoms, and all other reviewed systems are negative.  See HPI  ALLERGIES: Allergies  Allergen Reactions   Amoxicillin  Shortness Of Breath and Nausea Only    Dizziness   Other     Must have anti-emetic prior to being given any narcotics    Latex Rash and Other (See Comments)    HOME MEDICATIONS: Outpatient Medications Prior to Visit  Medication Sig Dispense Refill   acetaminophen  (TYLENOL ) 500 MG tablet Take 2 tablets (1,000 mg total) by mouth every 6 (six) hours as needed for moderate pain. 30 tablet 0   albuterol  (PROVENTIL ) (2.5 MG/3ML) 0.083% nebulizer solution Take 3 mLs (2.5 mg total) by nebulization every 6 (six) hours as needed for wheezing or shortness of breath. 75 mL 12   albuterol  (VENTOLIN  HFA) 108 (90 Base) MCG/ACT inhaler Inhale 1-2 puffs into the lungs every 6 (six) hours as needed for wheezing or shortness of breath. 1 each 3   ALPRAZolam  (XANAX ) 0.5 MG tablet Take 0.5 mg by mouth 2 (two) times daily as needed.     Ascorbic Acid (VITAMIN C) 100 MG tablet Take 100 mg by mouth daily.     BORIC ACID VAGINAL VA Place vaginally.     budesonide -formoterol  (SYMBICORT ) 160-4.5 MCG/ACT inhaler Inhale 2 puffs into the lungs 2 (two) times daily. 1 each 5   cholecalciferol (VITAMIN D3) 25 MCG (1000 UNIT) tablet Take 1,000 Units by mouth daily.     cyclobenzaprine  (FLEXERIL ) 10 MG tablet Take 10 mg by mouth at bedtime.     Dextroamphetamine Sulfate 20 MG TABS Take 40 mg by mouth as directed.     DULoxetine  (CYMBALTA) 30 MG capsule Take 30 mg by mouth daily.     famotidine  (PEPCID ) 20 MG tablet Take 1 tablet (20 mg total) by mouth 2 (two) times daily. 180 tablet 1   gabapentin  (NEURONTIN ) 100 MG capsule Take 4 capsules (400 mg total) by mouth at bedtime. 140 capsule 4   Galcanezumab -gnlm (EMGALITY ) 120 MG/ML SOAJ Inject 120 mg into the skin every 30 (thirty) days. 1.12 mL 11   Galcanezumab -gnlm (EMGALITY ) 120 MG/ML SOAJ Inject 240 mg into the skin every 30 (thirty) days. 2 mL 0   ibuprofen  (ADVIL ) 800 MG tablet Take 1 tablet (800 mg total) by mouth every 8 (eight) hours as needed. 30 tablet 2   lamoTRIgine  (LAMICTAL ) 200 MG tablet Take 100 mg by mouth 2 (two) times daily.     Lurasidone  HCl 60 MG TABS Take 60 mg by mouth every evening.     MAGNESIUM  MALATE PO Take 1 tablet by mouth daily at 6 (six) AM.  Multiple Vitamin (MULTIVITAMIN ADULT PO) Take 1 tablet by mouth daily.     omeprazole  (PRILOSEC) 40 MG capsule Take 1 capsule (40 mg total) by mouth 2 (two) times daily before a meal. 60 capsule 1   ondansetron  (ZOFRAN -ODT) 4 MG disintegrating tablet Take 1 tablet (4 mg total) by mouth every 8 (eight) hours as needed for nausea or vomiting. 20 tablet 1   QUEtiapine (SEROQUEL) 25 MG tablet Take 25 mg by mouth at bedtime.     Rimegepant Sulfate (NURTEC) 75 MG TBDP Take 1 tablet (75 mg total) by mouth as needed. 8 tablet 11   sulfamethoxazole -trimethoprim  (BACTRIM  DS) 800-160 MG tablet Take 1 tablet by mouth 2 (two) times daily for 10 days. 20 tablet 0   vitamin B-12 (CYANOCOBALAMIN ) 500 MCG tablet Take 500 mcg by mouth daily.     No facility-administered medications prior to visit.    PAST MEDICAL HISTORY: Past Medical History:  Diagnosis Date   Anemia    with periods   Ankle fracture    right   Anxiety    Asthma    Back pain    Bipolar 1 disorder (HCC)    Depression    Bipolar    Endometriosis    Fibromyalgia    GERD (gastroesophageal reflux disease)    Headache    MIGRAINES    Hyperlipidemia    IBS (irritable bowel syndrome)    Ovarian cyst    PCOS (polycystic ovarian syndrome)    Pneumonia    age 55   PONV (postoperative nausea and vomiting)    nausea after wisdom teeth extraction   Scoliosis    Vaginal Pap smear, abnormal    bx ok    PAST SURGICAL HISTORY: Past Surgical History:  Procedure Laterality Date   ABDOMINAL HYSTERECTOMY     FRACTURE SURGERY     ankle surgery with impant    HYSTEROSCOPY WITH D & C N/A 09/22/2019   Procedure: DILATATION AND CURETTAGE /HYSTEROSCOPY;  Surgeon: Ward, Mitzie BROCKS, MD;  Location: [facility];  Service: Gynecology;  Laterality: N/A;   LYSIS OF ADHESION N/A 05/08/2021   Procedure: LYSIS OF ADHESION, EXCISION OF ENDOMETRIOSIS;  Surgeon: Verdon Keen, MD;  Location: [facility];  Service: Gynecology;  Laterality: N/A;   ROBOTIC ASSISTED DIAGNOSTIC LAPAROSCOPY N/A 02/18/2017   Procedure: ROBOTIC ASSISTED DIAGNOSTIC LAPAROSCOPY,EXCISION AND ABLATION OF ENDOMETRIOSIS,LYSIS OF ADHESIONS;  Surgeon: Charlie Flowers, MD;  Location: [facility];  Service: Gynecology;  Laterality: N/A;   ROBOTIC ASSISTED LAPAROSCOPIC HYSTERECTOMY AND SALPINGECTOMY Bilateral 05/08/2021   Procedure: XI ROBOTIC ASSISTED TOTAL LAPAROSCOPIC HYSTERECTOMY AND SALPINGECTOMY;  Surgeon: Verdon Keen, MD;  Location: [facility];  Service: Gynecology;  Laterality: Bilateral;   VAGINAL DELIVERY N/A 08/14/2020   Procedure: VAGINAL DELIVERY;  Surgeon: Lovetta Debby PARAS, MD;  Location: [facility];  Service: Obstetrics;  Laterality: N/A;   WISDOM TOOTH EXTRACTION  2013    FAMILY HISTORY: Family History  Problem Relation Age of Onset   Endometriosis Mother    Bipolar disorder Mother    Diabetes Mother        prediabetes   Schizophrenia Father    Depression Father    Breast cancer Paternal Grandmother    Cancer Paternal Grandmother    Depression Paternal Grandmother    Hypertension Other    Hyperlipidemia Other    Asthma Other    Heart disease Neg Hx     Colon cancer Neg Hx    Ovarian cancer Neg Hx     SOCIAL HISTORY: Social  History   Socioeconomic History   Marital status: Married    Spouse name: Deward   Number of children: 3   Years of education: Not on file   Highest education level: Associate degree: academic program  Occupational History   Occupation: unemployeed   Tobacco Use   Smoking status: Former    Current packs/day: 0.00    Types: Cigarettes    Start date: 12/20/2008    Quit date: 12/20/2009    Years since quitting: 14.4   Smokeless tobacco: Never   Tobacco comments:    age [age], quit, 1 cig daily  Vaping Use   Vaping status: Never Used  Substance and Sexual Activity   Alcohol use: Yes    Comment: Randomly   Drug use: Never   Sexual activity: Not Currently    Partners: Female, Female    Birth control/protection: Surgical    Comment: hysterectomy  Other Topics Concern   Not on file  Social History Narrative   Lives at home with husband, son, and 15 year old twin girls    Right handed   Currently [redacted] weeks pregnant with twins (as of 01/17/2020)  ---update 03/02/2023 twins are now 30 years old    Caffeine: every once in awhile    Social Drivers of Health   Financial Resource Strain: High Risk (10/04/2023)   Overall Financial Resource Strain (CARDIA)    Difficulty of Paying Living Expenses: Hard  Food Insecurity: Food Insecurity Present (10/04/2023)   Hunger Vital Sign    Worried About Running Out of Food in the Last Year: Often true    Ran Out of Food in the Last Year: Sometimes true  Transportation Needs: Unmet Transportation Needs (10/04/2023)   PRAPARE - Administrator, Civil Service (Medical): Yes    Lack of Transportation (Non-Medical): No  Physical Activity: Inactive (10/04/2023)   Exercise Vital Sign    Days of Exercise per Week: 0 days    Minutes of Exercise per Session: 0 min  Stress: Stress Concern Present (10/04/2023)   [employer] of Occupational Health - Occupational Stress  Questionnaire    Feeling of Stress : Very much  Social Connections: Moderately Isolated (10/04/2023)   Social Connection and Isolation Panel    Frequency of Communication with Friends and Family: More than three times a week    Frequency of Social Gatherings with Friends and Family: Once a week    Attends Religious Services: Never    Database administrator or Organizations: No    Attends Banker Meetings: Never    Marital Status: Married  Catering manager Violence: Not At Risk (10/04/2023)   Humiliation, Afraid, Rape, and Kick questionnaire    Fear of Current or Ex-Partner: No    Emotionally Abused: No    Physically Abused: No    Sexually Abused: No    PHYSICAL EXAM  There were no vitals filed for this visit.   There is no height or weight on file to calculate BMI.  Generalized: Well developed, in no acute distress  Via video visit, is alert and oriented, speech is clear and concise, facial symmetry noted  DIAGNOSTIC DATA (LABS, IMAGING, TESTING) - I reviewed patient records, labs, notes, testing and imaging myself where available.  Lab Results  Component Value Date   WBC 6.5 01/10/2024   HGB 14.3 01/10/2024   HCT 43.2 01/10/2024   MCV 85.5 01/10/2024   PLT 370 01/10/2024      Component Value Date/Time  NA 136 01/10/2024 0826   NA 138 08/25/2018 1448   NA 138 07/11/2014 1446   K 4.4 01/10/2024 0826   K 3.8 07/11/2014 1446   CL 99 01/10/2024 0826   CL 102 07/11/2014 1446   CO2 29 01/10/2024 0826   CO2 29 07/11/2014 1446   GLUCOSE 85 01/10/2024 0826   GLUCOSE 89 07/11/2014 1446   BUN 8 01/10/2024 0826   BUN 11 08/25/2018 1448   BUN 10 07/11/2014 1446   CREATININE 0.68 01/10/2024 0826   CALCIUM 9.6 01/10/2024 0826   CALCIUM 9.2 07/11/2014 1446   PROT 7.3 01/10/2024 0826   PROT 6.9 08/25/2018 1448   PROT 7.5 08/26/2013 0821   ALBUMIN 3.6 09/08/2022 2257   ALBUMIN 4.1 08/25/2018 1448   ALBUMIN 3.4 (L) 08/26/2013 0821   AST 16 01/10/2024 0826    AST 17 08/26/2013 0821   ALT 20 01/10/2024 0826   ALT 30 08/26/2013 0821   ALKPHOS 58 09/08/2022 2257   ALKPHOS 102 08/26/2013 0821   BILITOT 0.5 01/10/2024 0826   BILITOT <0.2 08/25/2018 1448   BILITOT 0.2 08/26/2013 0821   GFRNONAA >60 09/08/2022 2257   GFRNONAA 126 12/14/2018 0000   GFRAA >60 03/01/2020 2103   GFRAA 146 12/14/2018 0000   Lab Results  Component Value Date   CHOL 218 (H) 01/10/2024   HDL 65 01/10/2024   LDLCALC 132 (H) 01/10/2024   TRIG 107 01/10/2024   CHOLHDL 3.4 01/10/2024   Lab Results  Component Value Date   HGBA1C 5.1 01/10/2024   Lab Results  Component Value Date   VITAMINB12 376 03/11/2021   Lab Results  Component Value Date   TSH 0.71 10/09/2021    Lauraine Born, AGNP-C, DNP 06/14/2024, 5:46 AM [facility] [address] [place], [place] [address] [phone]

## 2024-06-14 NOTE — Patient Instructions (Signed)
 Try Nurtec 75 mg tablet every other day for migraine prevention.  Use ibuprofen  as needed for acute headache.  Limit over-the-counter medication to no more than 2 to 3 days a week to prevent rebound headache.  We can try something else for migraines if your headaches continue to be frequent.  Feel free to reach out via MyChart.  Follow-up in 6 months.  Thanks!!

## 2024-06-26 DIAGNOSIS — F900 Attention-deficit hyperactivity disorder, predominantly inattentive type: Secondary | ICD-10-CM | POA: Diagnosis not present

## 2024-06-26 DIAGNOSIS — F3181 Bipolar II disorder: Secondary | ICD-10-CM | POA: Diagnosis not present

## 2024-06-26 DIAGNOSIS — F411 Generalized anxiety disorder: Secondary | ICD-10-CM | POA: Diagnosis not present

## 2024-06-30 DIAGNOSIS — Z419 Encounter for procedure for purposes other than remedying health state, unspecified: Secondary | ICD-10-CM | POA: Diagnosis not present

## 2024-07-14 ENCOUNTER — Ambulatory Visit
Admission: RE | Admit: 2024-07-14 | Discharge: 2024-07-14 | Disposition: A | Discharge status: Home / Self Care | Payer: Self-pay | Source: Ambulatory Visit

## 2024-07-14 ENCOUNTER — Other Ambulatory Visit: Payer: Self-pay

## 2024-07-14 VITALS — BP 117/82 | HR 90 | Temp 98.4°F | Resp 18

## 2024-07-14 DIAGNOSIS — R102 Pelvic and perineal pain: Secondary | ICD-10-CM | POA: Insufficient documentation

## 2024-07-14 DIAGNOSIS — R300 Dysuria: Secondary | ICD-10-CM | POA: Insufficient documentation

## 2024-07-14 DIAGNOSIS — R310 Gross hematuria: Secondary | ICD-10-CM | POA: Insufficient documentation

## 2024-07-14 LAB — POCT URINE DIPSTICK
Bilirubin, UA: NEGATIVE
Glucose, UA: NEGATIVE mg/dL
Ketones, POC UA: NEGATIVE mg/dL
Leukocytes, UA: NEGATIVE
Nitrite, UA: NEGATIVE
Spec Grav, UA: 1.02 (ref 1.010–1.025)
Urobilinogen, UA: 0.2 U/dL
pH, UA: 7 (ref 5.0–8.0)

## 2024-07-14 NOTE — ED Provider Notes (Signed)
 [facility]  Note:  This document was prepared using Conservation officer, historic buildings and may include unintentional dictation errors.  MRN: [id number] DOB: 11-Jun-1996  Subjective:   Janet Rich is a 28 y.o. female presenting for hematuria, vaginal pain, stomach pain x 1 day.  Patient reports she first noticed blood in the urine yesterday morning.  Patient also reports dysuria.  Patient states that she had leftover Bactrim  from an ear infection that she started taking yesterday.  Patient states she has taken 3 doses of Bactrim  so far and symptoms have improved this morning.  Patient denies any other medication.  Patient denies fever, vaginal discharge, flank pain, severe abdominal pain.  No current facility-administered medications for this encounter.  Current Outpatient Medications:    acetaminophen  (TYLENOL ) 500 MG tablet, Take 2 tablets (1,000 mg total) by mouth every 6 (six) hours as needed for moderate pain., Disp: 30 tablet, Rfl: 0   albuterol  (PROVENTIL ) (2.5 MG/3ML) 0.083% nebulizer solution, Take 3 mLs (2.5 mg total) by nebulization every 6 (six) hours as needed for wheezing or shortness of breath., Disp: 75 mL, Rfl: 12   albuterol  (VENTOLIN  HFA) 108 (90 Base) MCG/ACT inhaler, Inhale 1-2 puffs into the lungs every 6 (six) hours as needed for wheezing or shortness of breath., Disp: 1 each, Rfl: 3   ALPRAZolam  (XANAX ) 0.5 MG tablet, Take 0.5 mg by mouth 2 (two) times daily as needed., Disp: , Rfl:    Ascorbic Acid (VITAMIN C) 100 MG tablet, Take 100 mg by mouth daily., Disp: , Rfl:    BORIC ACID VAGINAL VA, Place vaginally., Disp: , Rfl:    budesonide -formoterol  (SYMBICORT ) 160-4.5 MCG/ACT inhaler, Inhale 2 puffs into the lungs 2 (two) times daily., Disp: 1 each, Rfl: 5   cholecalciferol (VITAMIN D3) 25 MCG (1000 UNIT) tablet, Take 1,000 Units by mouth daily., Disp: , Rfl:    cyclobenzaprine  (FLEXERIL ) 10 MG tablet, Take 10 mg by mouth at bedtime., Disp: , Rfl:     Dextroamphetamine Sulfate 20 MG TABS, Take 40 mg by mouth as directed., Disp: , Rfl:    DULoxetine (CYMBALTA) 30 MG capsule, Take 30 mg by mouth daily., Disp: , Rfl:    ibuprofen  (ADVIL ) 800 MG tablet, Take 1 tablet (800 mg total) by mouth every 8 (eight) hours as needed., Disp: 30 tablet, Rfl: 2   lamoTRIgine  (LAMICTAL ) 200 MG tablet, Take 100 mg by mouth 2 (two) times daily., Disp: , Rfl:    Lurasidone  HCl 60 MG TABS, Take 60 mg by mouth every evening., Disp: , Rfl:    Multiple Vitamin (MULTIVITAMIN ADULT PO), Take 1 tablet by mouth daily., Disp: , Rfl:    ondansetron  (ZOFRAN -ODT) 4 MG disintegrating tablet, Take 1 tablet (4 mg total) by mouth every 8 (eight) hours as needed for nausea or vomiting., Disp: 20 tablet, Rfl: 1   QUEtiapine (SEROQUEL) 25 MG tablet, Take 25 mg by mouth at bedtime., Disp: , Rfl:    Rimegepant Sulfate (NURTEC) 75 MG TBDP, Take 1 tablet (75 mg total) by mouth every other day., Disp: 16 tablet, Rfl: 11   vitamin B-12 (CYANOCOBALAMIN ) 500 MCG tablet, Take 500 mcg by mouth daily., Disp: , Rfl:    famotidine  (PEPCID ) 20 MG tablet, Take 1 tablet (20 mg total) by mouth 2 (two) times daily. (Patient not taking: Reported on 07/14/2024), Disp: 180 tablet, Rfl: 1   gabapentin  (NEURONTIN ) 100 MG capsule, Take 4 capsules (400 mg total) by mouth at bedtime. (Patient not taking: Reported on 07/14/2024), Disp:  140 capsule, Rfl: 4   MAGNESIUM  MALATE PO, Take 1 tablet by mouth daily at 6 (six) AM. (Patient not taking: Reported on 07/14/2024), Disp: , Rfl:    omeprazole  (PRILOSEC) 40 MG capsule, Take 1 capsule (40 mg total) by mouth 2 (two) times daily before a meal. (Patient not taking: Reported on 07/14/2024), Disp: 60 capsule, Rfl: 1   triamcinolone  ointment (KENALOG ) 0.1 %, Apply topically. (Patient not taking: Reported on 07/14/2024), Disp: , Rfl:    Allergies  Allergen Reactions   Amoxicillin  Shortness Of Breath and Nausea Only    Dizziness   Other     Must have anti-emetic prior to  being given any narcotics    Latex Rash and Other (See Comments)    Past Medical History:  Diagnosis Date   Anemia    with periods   Ankle fracture    right   Anxiety    Asthma    Back pain    Bipolar 1 disorder (HCC)    Depression    Bipolar    Endometriosis    Fibromyalgia    GERD (gastroesophageal reflux disease)    Headache    MIGRAINES   Hyperlipidemia    IBS (irritable bowel syndrome)    Ovarian cyst    PCOS (polycystic ovarian syndrome)    Pneumonia    age 12   PONV (postoperative nausea and vomiting)    nausea after wisdom teeth extraction   Scoliosis    Vaginal Pap smear, abnormal    bx ok     Past Surgical History:  Procedure Laterality Date   ABDOMINAL HYSTERECTOMY     FRACTURE SURGERY     ankle surgery with impant    HYSTEROSCOPY WITH D & C N/A 09/22/2019   Procedure: DILATATION AND CURETTAGE /HYSTEROSCOPY;  Surgeon: Ward, Mitzie BROCKS, MD;  Location: [facility];  Service: Gynecology;  Laterality: N/A;   LYSIS OF ADHESION N/A 05/08/2021   Procedure: LYSIS OF ADHESION, EXCISION OF ENDOMETRIOSIS;  Surgeon: Verdon Keen, MD;  Location: [facility];  Service: Gynecology;  Laterality: N/A;   ROBOTIC ASSISTED DIAGNOSTIC LAPAROSCOPY N/A 02/18/2017   Procedure: ROBOTIC ASSISTED DIAGNOSTIC LAPAROSCOPY,EXCISION AND ABLATION OF ENDOMETRIOSIS,LYSIS OF ADHESIONS;  Surgeon: Charlie Flowers, MD;  Location: [facility];  Service: Gynecology;  Laterality: N/A;   ROBOTIC ASSISTED LAPAROSCOPIC HYSTERECTOMY AND SALPINGECTOMY Bilateral 05/08/2021   Procedure: XI ROBOTIC ASSISTED TOTAL LAPAROSCOPIC HYSTERECTOMY AND SALPINGECTOMY;  Surgeon: Verdon Keen, MD;  Location: [facility];  Service: Gynecology;  Laterality: Bilateral;   VAGINAL DELIVERY N/A 08/14/2020   Procedure: VAGINAL DELIVERY;  Surgeon: Lovetta Debby PARAS, MD;  Location: [facility];  Service: Obstetrics;  Laterality: N/A;   WISDOM TOOTH EXTRACTION  2013    Family History  Problem Relation Age of Onset   Endometriosis  Mother    Bipolar disorder Mother    Diabetes Mother        prediabetes   Schizophrenia Father    Depression Father    Breast cancer Paternal Grandmother    Cancer Paternal Grandmother    Depression Paternal Grandmother    Hypertension Other    Hyperlipidemia Other    Asthma Other    Heart disease Neg Hx    Colon cancer Neg Hx    Ovarian cancer Neg Hx     Social History   Tobacco Use   Smoking status: Former    Current packs/day: 0.00    Types: Cigarettes    Start date: 12/20/2008    Quit date: 12/20/2009  Years since quitting: 14.5   Smokeless tobacco: Never   Tobacco comments:    age [age], quit, 1 cig daily  Vaping Use   Vaping status: Never Used  Substance Use Topics   Alcohol use: Yes    Comment: Randomly   Drug use: Never    ROS Refer to HPI for ROS details.  Objective:   Vitals: BP 117/82 (BP Location: Left Arm)   Pulse 90   Temp 98.4 F (36.9 C) (Oral)   Resp 18   LMP 04/08/2021   SpO2 97%   Physical Exam Vitals and nursing note reviewed.  Constitutional:      General: She is not in acute distress.    Appearance: Normal appearance. She is well-developed. She is not ill-appearing or toxic-appearing.  HENT:     Head: Normocephalic and atraumatic.  Cardiovascular:     Rate and Rhythm: Normal rate.  Pulmonary:     Effort: Pulmonary effort is normal. No respiratory distress.  Abdominal:     General: There is no distension.     Palpations: Abdomen is soft.     Tenderness: There is no abdominal tenderness. There is no right CVA tenderness or left CVA tenderness.  Skin:    General: Skin is warm and dry.  Neurological:     General: No focal deficit present.     Mental Status: She is alert and oriented to person, place, and time.  Psychiatric:        Mood and Affect: Mood normal.        Behavior: Behavior normal.     Procedures  Results for orders placed or performed during the hospital encounter of 07/14/24 (from the past 24 hours)  POCT  URINE DIPSTICK     Status: Abnormal   Collection Time: 07/14/24 12:15 PM  Result Value Ref Range   Color, UA yellow yellow   Clarity, UA cloudy (A) clear   Glucose, UA negative negative mg/dL   Bilirubin, UA negative negative   Ketones, POC UA negative negative mg/dL   Spec Grav, UA 8.979 8.989 - 1.025   Blood, UA trace-intact (A) negative   pH, UA 7.0 5.0 - 8.0   POC PROTEIN,UA trace negative, trace   Urobilinogen, UA 0.2 0.2 or 1.0 E.U./dL   Nitrite, UA Negative Negative   Leukocytes, UA Negative Negative    No results found.   Assessment and Plan :     Discharge Instructions       1. Gross hematuria (Primary) - POCT URINE DIPSTICK completed in UC shows trace blood, no leukocytes, no nitrite, no significant sign of urinary tract infection. - Urine Culture collected in UC and sent to lab for further testing results should be available in 2 to 3 days if there is any abnormal findings patient will be contacted and appropriate treatment provided. - Ambulatory referral to Urology for follow-up evaluation and management of gross hematuria with no known cause. -Continue to monitor symptoms for any change in severity if there is any escalation of current symptoms or development of new symptoms follow-up in ER for further evaluation and management.       Pierrette Scheu B Kenia Teagarden   Jader Desai, Cyrus B, [place] 07/14/24 [phone]

## 2024-07-14 NOTE — ED Triage Notes (Signed)
 Blood in pee, pain in clitoris, stomach pain - Entered by patient  Pt reports pain with urination and blood in her urine since yesterday morning. States she has also had stomach pain. She had leftover Bactrim  and has taken 3 doses. No other meds tried.

## 2024-07-14 NOTE — Discharge Instructions (Signed)
  1. Gross hematuria (Primary) - POCT URINE DIPSTICK completed in UC shows trace blood, no leukocytes, no nitrite, no significant sign of urinary tract infection. - Urine Culture collected in UC and sent to lab for further testing results should be available in 2 to 3 days if there is any abnormal findings patient will be contacted and appropriate treatment provided. - Ambulatory referral to Urology for follow-up evaluation and management of gross hematuria with no known cause. -Continue to monitor symptoms for any change in severity if there is any escalation of current symptoms or development of new symptoms follow-up in ER for further evaluation and management.

## 2024-07-15 LAB — URINE CULTURE
Culture: <10000 — AB
Special Requests: NORMAL

## 2024-07-17 ENCOUNTER — Ambulatory Visit (HOSPITAL_COMMUNITY): Payer: Self-pay

## 2024-08-30 DIAGNOSIS — Z419 Encounter for procedure for purposes other than remedying health state, unspecified: Secondary | ICD-10-CM | POA: Diagnosis not present

## 2024-09-11 ENCOUNTER — Ambulatory Visit

## 2024-09-11 DIAGNOSIS — Z719 Counseling, unspecified: Secondary | ICD-10-CM

## 2024-09-11 DIAGNOSIS — Z23 Encounter for immunization: Secondary | ICD-10-CM

## 2024-09-11 NOTE — Progress Notes (Signed)
 In nurse clinic for immunizations today -Moderna Covid vaccine. Tolerated well to L. Delt. VIS given and copy of [facility] provided.

## 2024-09-29 DIAGNOSIS — Z419 Encounter for procedure for purposes other than remedying health state, unspecified: Secondary | ICD-10-CM | POA: Diagnosis not present

## 2024-10-03 ENCOUNTER — Ambulatory Visit: Payer: Self-pay

## 2024-10-03 NOTE — Telephone Encounter (Signed)
 FYI Only or Action Required?: FYI only for provider: appointment scheduled on 10/05/24.  Patient was last seen in primary care on 06/10/2024 by Theotis Haze ORN, NP.  Called Nurse Triage reporting Leg Pain.  Symptoms began several weeks ago.  Interventions attempted: Nothing.  Symptoms are: unchanged.  Triage Disposition: See PCP When Office is Open (Within 3 Days)  Patient/caregiver understands and will follow disposition?: Yes    Copied from CRM #[id number]. Topic: Clinical - Red Word Triage >> Oct 03, 2024 12:43 PM Carla L wrote: Red Word that prompted transfer to Nurse Triage:  in a lot of pain, unable to sleep due to pain. patient has only had 2 hours of sleep. Pain in leg, exhausted Reason for Disposition  [1] MODERATE pain (e.g., interferes with normal activities, limping) AND [2] present > 3 days  Answer Assessment - Initial Assessment Questions Patient says her fibromyalgia flared up about 3 weeks ago and past week has gotten worse, unable to sleep due to the pain, no other symptoms. Scheduled first available on 12/18.  1. ONSET: When did the pain start?      3 weeks since flare up started, last week progressively worse  2. LOCATION: Where is the pain located?      Entire leg from thigh down, worse in the thighs  3. PAIN: How bad is the pain?    (Scale 1-10; or mild, moderate, severe)     Right now 5/10, at it's 8/10 at worst  4. WORK OR EXERCISE: Has there been any recent work or exercise that involved this part of the body?      No  5. CAUSE: What do you think is causing the leg pain?     Fibromyalgia flare up  6. OTHER SYMPTOMS: Do you have any other symptoms? (e.g., chest pain, back pain, breathing difficulty, swelling, rash, fever, numbness, weakness)     Tingling that is usual, lower back that is usual  7. PREGNANCY: Is there any chance you are pregnant? When was your last menstrual period?     No  Protocols used: Leg Pain-A-AH

## 2024-10-05 ENCOUNTER — Encounter: Payer: Self-pay | Admitting: Nurse Practitioner

## 2024-10-05 ENCOUNTER — Ambulatory Visit (INDEPENDENT_AMBULATORY_CARE_PROVIDER_SITE_OTHER): Admitting: Nurse Practitioner

## 2024-10-05 ENCOUNTER — Ambulatory Visit

## 2024-10-05 VITALS — BP 134/88 | HR 95 | Temp 98.5°F | Ht 61.0 in | Wt 192.0 lb

## 2024-10-05 DIAGNOSIS — M797 Fibromyalgia: Secondary | ICD-10-CM

## 2024-10-05 DIAGNOSIS — K219 Gastro-esophageal reflux disease without esophagitis: Secondary | ICD-10-CM | POA: Diagnosis not present

## 2024-10-05 DIAGNOSIS — Z719 Counseling, unspecified: Secondary | ICD-10-CM

## 2024-10-05 DIAGNOSIS — Z23 Encounter for immunization: Secondary | ICD-10-CM

## 2024-10-05 MED ORDER — ONDANSETRON 4 MG PO TBDP: 4.0000 mg | ORAL_TABLET | Freq: Three times a day (TID) | ORAL | 3 refills | Status: AC | PRN

## 2024-10-05 MED ORDER — IBUPROFEN 800 MG PO TABS: 800.0000 mg | ORAL_TABLET | Freq: Three times a day (TID) | ORAL | 3 refills | Status: AC | PRN

## 2024-10-05 MED ORDER — CYCLOBENZAPRINE HCL 10 MG PO TABS: 10.0000 mg | ORAL_TABLET | Freq: Every day | ORAL | 3 refills | Status: AC

## 2024-10-05 NOTE — Progress Notes (Signed)
 In nurse clinic with family for flu vaccine. Counseled on vaccine.   Flu given and tolerated well. Updated [facility] copy given and reviewed. Michail Boyte, RN

## 2024-10-05 NOTE — Progress Notes (Signed)
 BP 134/88   Pulse 95   Temp 98.5 F (36.9 C)   Ht 5' 1 (1.549 m)   Wt 192 lb (87.1 kg)   LMP 04/08/2021   SpO2 97%   BMI 36.28 kg/m    Subjective:    [name], female    DOB: February 06, 1996, 28 y.o.   MRN: [id number]  HPI: Janet Rich is a 28 y.o. female  Chief Complaint  Patient presents with   Leg Pain    Pt c/o bilateral leg pain x1 week.    Discussed the use of AI scribe software for clinical note transcription with the patient, who gave verbal consent to proceed.  History of Present Illness Janet Rich is a 28 year old female with fibromyalgia who presents with a severe flare-up of symptoms.  Widespread pain and sleep disturbance - Severe flare-up of fibromyalgia symptoms for over one week - Pain intensity prevents sleep; has not slept in a week due to pain - Increased pain and sleep deprivation associated with more frequent nightmares - Increased pain frequency since discontinuing gabapentin , but improved mental clarity without it  Medication management and adherence - Typically manages symptoms with muscle relaxer before bed and 800 mg ibuprofen , which is effective - Ran out of ibuprofen  five months ago; has been rationing muscle relaxers over the last six months - Not currently taking Pepcid , gabapentin , omeprazole , or Seroquel - Ran out of Zofran , resulting in frequent vomiting  Gastrointestinal symptoms/GERD - Frequent vomiting since running out of Zofran  Discontinued taking gerd medication.  Treating it with diet  Psychosocial stressors - Significant stress due to financial difficulties, including being behind on bills - Receiving financial assistance from father-in-law, which must be repaid with interest - Concerned about potential loss of insurance in January due to Medicaid cuts - Stress exacerbates pain and sleep disturbance  Functional status and life circumstances - Mother of three children - Recently  completed bachelor's degree in English - Planning to start homeschooling her child in April - Managing a household with six cats and two frogs         01/10/2024    7:58 AM 01/10/2024    7:54 AM 10/04/2023    3:27 PM  Depression screen PHQ 2/9  Decreased Interest 0 0 3  Down, Depressed, Hopeless 0 0 3  PHQ - 2 Score 0 0 6  Altered sleeping 0  3  Tired, decreased energy 0  3  Change in appetite 0  1  Feeling bad or failure about yourself  0  3  Trouble concentrating 0  3  Moving slowly or fidgety/restless 0  1  Suicidal thoughts 0  0  PHQ-9 Score 0   20   Difficult doing work/chores Not difficult at all  Extremely dIfficult     Data saved with a previous flowsheet row definition    Relevant past medical, surgical, family and social history reviewed and updated as indicated. Interim medical history since our last visit reviewed. Allergies and medications reviewed and updated.  Review of Systems  Ten systems reviewed and is negative except as mentioned in HPI      Objective:      BP 134/88   Pulse 95   Temp 98.5 F (36.9 C)   Ht 5' 1 (1.549 m)   Wt 192 lb (87.1 kg)   LMP 04/08/2021   SpO2 97%   BMI 36.28 kg/m    Wt Readings from Last 3 Encounters:  10/05/24 192 lb (87.1 kg)  01/10/24 186 lb 8 oz (84.6 kg)  12/19/23 190 lb (86.2 kg)    Physical Exam GENERAL: Alert, cooperative, well developed, no acute distress HEENT: Normocephalic, normal oropharynx, moist mucous membranes CHEST: Clear to auscultation bilaterally, No wheezes, rhonchi, or crackles CARDIOVASCULAR: Normal heart rate and rhythm, S1 and S2 normal without murmurs ABDOMEN: Soft, non-tender, non-distended, without organomegaly, Normal bowel sounds EXTREMITIES: No cyanosis or edema NEUROLOGICAL: Cranial nerves grossly intact, Moves all extremities without gross motor or sensory deficit  Results for orders placed or performed during the hospital encounter of 07/14/24  POCT URINE DIPSTICK    Collection Time: 07/14/24 12:15 PM  Result Value Ref Range   Color, UA yellow yellow   Clarity, UA cloudy (A) clear   Glucose, UA negative negative mg/dL   Bilirubin, UA negative negative   Ketones, POC UA negative negative mg/dL   Spec Grav, UA 8.979 8.989 - 1.025   Blood, UA trace-intact (A) negative   pH, UA 7.0 5.0 - 8.0   POC PROTEIN,UA trace negative, trace   Urobilinogen, UA 0.2 0.2 or 1.0 E.U./dL   Nitrite, UA Negative Negative   Leukocytes, UA Negative Negative  Urine Culture   Collection Time: 07/14/24 12:26 PM   Specimen: Urine, Clean Catch  Result Value Ref Range   Specimen Description URINE, CLEAN CATCH    Special Requests Normal    Culture (A)     <10,000 COLONIES/mL INSIGNIFICANT GROWTH Performed at [facility], [address] [place], [place] [address]    Report Status 07/15/2024 FINAL           Assessment & Plan:   Problem List Items Addressed This Visit       Other   Fibromyalgia - Primary   Relevant Medications   ibuprofen  (ADVIL ) 800 MG tablet   cyclobenzaprine  (FLEXERIL ) 10 MG tablet   Other Visit Diagnoses       Gastroesophageal reflux disease without esophagitis       Relevant Medications   ondansetron  (ZOFRAN -ODT) 4 MG disintegrating tablet        Assessment and Plan Assessment & Plan Fibromyalgia Chronic fibromyalgia with recent exacerbation over the past week, characterized by severe pain, insomnia, and increased frequency of nightmares. Stress and potential loss of insurance are contributing factors. Current management includes muscle relaxers and ibuprofen , but she has been rationing due to running out of medications. Gabapentin  was discontinued due to missed doses, resulting in increased pain but improved mental clarity. She is not taking omeprazole  or Seroquel. - Prescribed muscle relaxers with refills. - Prescribed ibuprofen  with refills. - Prescribed Zofran  for nausea. - Advised checking GoodRx for medication cost  savings. - Encouraged communication if additional support is needed.    Thalia -discontinue medication, watching diet.  Still has episodes of nausea due to stress, refilled zofran     Follow up plan: Return if symptoms worsen or fail to improve.

## 2024-11-01 ENCOUNTER — Ambulatory Visit: Payer: Self-pay

## 2024-11-01 NOTE — Telephone Encounter (Signed)
 FYI Only or Action Required?: FYI only for provider: appointment scheduled on 11/02/24.  Patient was last seen in primary care on 10/05/2024 by Gareth Mliss FALCON, FNP.  Called Nurse Triage reporting Allergic Reaction.  Symptoms began yesterday.  Interventions attempted: OTC medications: Benadryl , claritin, and Ice/heat application.  Symptoms are: stable.  Triage Disposition: See Physician Within 24 Hours  Patient/caregiver understands and will follow disposition?: Yes  Reason for Disposition  [1] MODERATE-SEVERE hives (e.g.,hives interfere with normal activities or work) AND [2] not improved after taking antihistamine (e.g., cetirizine, fexofenadine, or loratadine) > 24 hours  Answer Assessment - Initial Assessment Questions Last night it was on pts neck and face, have completed subsided. Took kids benadryl  as that's all she had on hand and claritin and using ice  1. APPEARANCE: What does the rash look like?      Red, welts   2. LOCATION: Where is the rash located?      Thighs  3. NUMBER: How many hives are there?       A ton  4. SIZE: How big are the hives? (e.g., inches, cm, compare to coins) Do they all look the same or do they vary in shape and size?      Finger tips size   5. ONSET: When did the hives begin? (e.g., hours or days ago)      Last night  6. ITCHING: Does it itch? If Yes, ask: How bad is the itch?  (e.g., none, mild, moderate, severe)     Severe   7. RECURRENT PROBLEM: Have you had hives before? If Yes, ask: When was the last time? and What happened that time?      has gotten hives within the last year, especially being around cats. Experienced difficulty breathing when licked by a horse as a child.  8. TRIGGERS: Were you exposed to any new food, plant, cosmetic product or animal just before the hives began?     Unsure if its gluten, dairy, or cats.  9. OTHER SYMPTOMS: Do you have any other symptoms? (e.g., fever, tongue  swelling, difficulty breathing, abdomen pain)     Last night experienced throat swelling, took a warm shower and took benadryl . Not currently experiencing difficulty breathing.  Protocols used: [facility]  Copied from CRM [id number]. Topic: Clinical - Red Word Triage >> Nov 01, 2024  4:04 PM Joesph NOVAK wrote: Red Word that prompted transfer to Nurse Triage: Patient wants a Emergency referral for allergies, patient is breaking out in hives. trouble breathing. Swelling.

## 2024-11-02 ENCOUNTER — Ambulatory Visit (INDEPENDENT_AMBULATORY_CARE_PROVIDER_SITE_OTHER): Admitting: Family Medicine

## 2024-11-02 ENCOUNTER — Encounter: Payer: Self-pay | Admitting: Family Medicine

## 2024-11-02 VITALS — BP 118/66 | HR 92 | Resp 16 | Ht 61.0 in | Wt 192.0 lb

## 2024-11-02 DIAGNOSIS — L509 Urticaria, unspecified: Secondary | ICD-10-CM | POA: Diagnosis not present

## 2024-11-02 MED ORDER — EPINEPHRINE 0.3 MG/0.3ML IJ SOAJ: 0.3000 mg | INTRAMUSCULAR | 0 refills | Status: AC | PRN

## 2024-11-02 MED ORDER — HYDROXYZINE PAMOATE 25 MG PO CAPS: 25.0000 mg | ORAL_CAPSULE | Freq: Three times a day (TID) | ORAL | 0 refills | Status: AC | PRN

## 2024-11-02 NOTE — Progress Notes (Signed)
 "  Acute Office Visit  Subjective:     [name], female    DOB: January 22, 1996, 29 y.o.   MRN: [id number]  Chief Complaint  Patient presents with   Urticaria    Monday/Tuesday, itching. Taking Claritin to help make them go away. All over body- not specific location. Feels like it may be her cats.    HPI Patient is in today for concerns of hives. She is a new patient to me, though established in office. She voices for the last two days she breaks out in hives. She suspects that hives is due to her cats and potential allergy to cats. She voices if she wears any clothes that her cats have laid on she has a break out, and she voices if she touches her cats she also breaks out. She voices her cats are not new to her, voicing she has 6 cats and has had these cats for the last 2 years. She has noticed in the past if she meets a new animal, such as a cat, she will become congested and sniffle. She voices she has not had the congestion or sniffles but breaking out in rash/ hives as noted. She voices associated pruritus with hives. Rash has affected her feet, hands, thighs, torso and neck in the last two days. She voices when the rash was present on her neck that she felt short of breath. She denies shortness of breath at time of visit today.   She voices she started taking Claritin yesterday and repeated a dose this morning. She voices since starting Claritin rash has improved and itching has also improved. She tried using OTC Hydrocortisone and voices some relief with this.  Review of Systems  Skin:  Positive for itching and rash.        Objective:    BP 118/66   Pulse 92   Resp 16   Ht 5' 1 (1.549 m)   Wt 192 lb (87.1 kg)   LMP 04/08/2021   SpO2 99%   BMI 36.28 kg/m      Physical Exam Constitutional:      General: She is not in acute distress.    Appearance: Normal appearance.  Cardiovascular:     Rate and Rhythm: Normal rate and regular rhythm.     Heart  sounds: Normal heart sounds.  Pulmonary:     Effort: Pulmonary effort is normal. No respiratory distress.     Breath sounds: Normal breath sounds. No wheezing, rhonchi or rales.  Skin:    Findings: Rash present.  Neurological:     General: No focal deficit present.     Mental Status: She is alert.  Psychiatric:        Mood and Affect: Mood normal.        Behavior: Behavior normal.       Assessment & Plan:   Assessment & Plan Hives Hives as described affecting several areas of her body with associated itching/ pruritus. Concerns for shortness of breath when rash was present on the neck. Denies shortness of breath at time of visit. Suspects that hives is due to cat allergy. See HPI for additional details.   -Referral to allergist for allergy testing and confirmation -Recommended she continue Claritin once daily  -Hydroxyzine  25mg  TID PRN itching/ rash. Advised that medication can cause drowsiness/ sleepiness, take with caution.  -Will note discussed potential for removing suspected allergen (cat) from her environment but she has no interest in doing this.  -Red  flag symptoms/ ED precautions advised and this includes reoccurrence of shortness of breath or chest tightness  -Epipen  prescription provided with instructions that if used for concern of anaphylactic reaction (shortness of breath, airway impairment, chest tightness) that she should immediately be seen at the ED for evaluation. She voices understanding and is agreeable.  -Return in 3 months for f/u with PCP  Orders:   Ambulatory referral to Allergy   hydrOXYzine  (VISTARIL ) 25 MG capsule; Take 1 capsule (25 mg total) by mouth every 8 (eight) hours as needed for itching. Medication can cause sleepiness/ drowsiness.   EPINEPHrine  0.3 mg/0.3 mL IJ SOAJ injection; Inject 0.3 mg into the muscle as needed for anaphylaxis.      Return in about 3 months (around 01/31/2025).  LAYMON LOISE CORE, FNP  "

## 2024-11-06 NOTE — Progress Notes (Unsigned)
 "  New Patient Note  RE: Janet Rich MRN: [id number] DOB: December 09, 1995 Date of Office Visit: 11/07/2024  Consult requested by: Edith Laymon SAILOR, FNP Primary care provider: Gareth Mliss FALCON, FNP  Chief Complaint: No chief complaint on file.  History of Present Illness: I had the pleasure of seeing Janet Rich for initial evaluation at the [facility] on 11/07/2024. She is a 29 y.o. female, who is referred here by Edith, Laymon SAILOR, FNP for the evaluation of hives/cat allergy.  Discussed the use of AI scribe software for clinical note transcription with the patient, who gave verbal consent to proceed.  History of Present Illness             She reports symptoms of ***. Symptoms have been going on for *** years. The symptoms are present *** all year around with worsening in ***. Other triggers include exposure to ***. Anosmia: ***. Headache: ***. She has used *** with ***fair improvement in symptoms. Sinus infections: ***. Previous work up includes: ***. Previous ENT evaluation: ***. Previous sinus imaging: ***. History of nasal polyps: ***. Last eye exam: ***. History of reflux: ***.  11/02/2024 PCP visit: Patient is in today for concerns of hives. She is a new patient to me, though established in office. She voices for the last two days she breaks out in hives. She suspects that hives is due to her cats and potential allergy to cats. She voices if she wears any clothes that her cats have laid on she has a break out, and she voices if she touches her cats she also breaks out. She voices her cats are not new to her, voicing she has 6 cats and has had these cats for the last 2 years. She has noticed in the past if she meets a new animal, such as a cat, she will become congested and sniffle. She voices she has not had the congestion or sniffles but breaking out in rash/ hives as noted. She voices associated pruritus with hives. Rash has affected her feet, hands,  thighs, torso and neck in the last two days. She voices when the rash was present on her neck that she felt short of breath. She denies shortness of breath at time of visit today.    She voices she started taking Claritin yesterday and repeated a dose this morning. She voices since starting Claritin rash has improved and itching has also improved. She tried using OTC Hydrocortisone and voices some relief with this.  Assessment and Plan: [name] is a 29 y.o. female with: ***  Assessment and Plan               No follow-ups on file.  No orders of the defined types were placed in this encounter.  Lab Orders  No laboratory test(s) ordered today    Other allergy screening: Asthma: {Blank single:19197::yes,no} Rhino conjunctivitis: {Blank single:19197::yes,no} Food allergy: {Blank single:19197::yes,no} Medication allergy: {Blank single:19197::yes,no} Hymenoptera allergy: {Blank single:19197::yes,no} Urticaria: {Blank single:19197::yes,no} Eczema:{Blank single:19197::yes,no} History of recurrent infections suggestive of immunodeficency: {Blank single:19197::yes,no}  Diagnostics: Spirometry:  Tracings reviewed. Her effort: {Blank single:19197::Good reproducible efforts.,It was hard to get consistent efforts and there is a question as to whether this reflects a maximal maneuver.,Poor effort, data can not be interpreted.} FVC: ***L FEV1: ***L, ***% predicted FEV1/FVC ratio: ***% Interpretation: {Blank single:19197::Spirometry consistent with mild obstructive disease,Spirometry consistent with moderate obstructive disease,Spirometry consistent with severe obstructive disease,Spirometry consistent with possible restrictive disease,Spirometry consistent with mixed obstructive and restrictive  disease,Spirometry uninterpretable due to technique,Spirometry consistent with normal pattern,No overt abnormalities noted given today's efforts}.   Please see scanned spirometry results for details.  Skin Testing: {Blank single:19197::Select foods,Environmental allergy panel,Environmental allergy panel and select foods,Food allergy panel,None,Deferred due to recent antihistamines use}. *** Results discussed with patient/family.   Past Medical History: Patient Active Problem List   Diagnosis Date Noted   Mixed hyperlipidemia 07/12/2023   Attention deficit hyperactivity disorder (ADHD), combined type 01/08/2023   Chronic migraine w/o aura w/o status migrainosus, not intractable 07/30/2022   Bulging of lumbar intervertebral disc 06/16/2022   Fibromyalgia 03/09/2022   Chronic back pain 03/09/2022   Right trigeminal neuralgia 11/08/2019   Endometriosis 03/27/2019   PCOS (polycystic ovarian syndrome) 03/27/2019   Moderate persistent asthma 03/07/2019   Irritable bowel syndrome with diarrhea 03/07/2019   Gastroesophageal reflux disease without esophagitis 03/07/2019   Class 1 obesity due to excess calories with serious comorbidity and body mass index (BMI) of 34.0 to 34.9 in adult 09/02/2018   Bipolar 2 disorder (HCC) 08/12/2018   Vulvar atrophy 03/11/2018   Anxiety 05/01/2015   Past Medical History:  Diagnosis Date   Anemia    with periods   Ankle fracture    right   Anxiety    Asthma    Back pain    Bipolar 1 disorder (HCC)    Depression    Bipolar    Endometriosis    Fibromyalgia    GERD (gastroesophageal reflux disease)    Headache    MIGRAINES   Hyperlipidemia    IBS (irritable bowel syndrome)    Ovarian cyst    PCOS (polycystic ovarian syndrome)    Pneumonia    age 29   PONV (postoperative nausea and vomiting)    nausea after wisdom teeth extraction   Scoliosis    Vaginal Pap smear, abnormal    bx ok   Past Surgical History: Past Surgical History:  Procedure Laterality Date   ABDOMINAL HYSTERECTOMY     FRACTURE SURGERY     ankle surgery with impant    HYSTEROSCOPY WITH D & C N/A  09/22/2019   Procedure: DILATATION AND CURETTAGE /HYSTEROSCOPY;  Surgeon: Ward, Mitzie BROCKS, MD;  Location: [facility];  Service: Gynecology;  Laterality: N/A;   LYSIS OF ADHESION N/A 05/08/2021   Procedure: LYSIS OF ADHESION, EXCISION OF ENDOMETRIOSIS;  Surgeon: Verdon Keen, MD;  Location: [facility];  Service: Gynecology;  Laterality: N/A;   ROBOTIC ASSISTED DIAGNOSTIC LAPAROSCOPY N/A 02/18/2017   Procedure: ROBOTIC ASSISTED DIAGNOSTIC LAPAROSCOPY,EXCISION AND ABLATION OF ENDOMETRIOSIS,LYSIS OF ADHESIONS;  Surgeon: Charlie Flowers, MD;  Location: [facility];  Service: Gynecology;  Laterality: N/A;   ROBOTIC ASSISTED LAPAROSCOPIC HYSTERECTOMY AND SALPINGECTOMY Bilateral 05/08/2021   Procedure: XI ROBOTIC ASSISTED TOTAL LAPAROSCOPIC HYSTERECTOMY AND SALPINGECTOMY;  Surgeon: Verdon Keen, MD;  Location: [facility];  Service: Gynecology;  Laterality: Bilateral;   VAGINAL DELIVERY N/A 08/14/2020   Procedure: VAGINAL DELIVERY;  Surgeon: Lovetta Debby PARAS, MD;  Location: [facility];  Service: Obstetrics;  Laterality: N/A;   WISDOM TOOTH EXTRACTION  2013   Medication List:  Current Outpatient Medications  Medication Sig Dispense Refill   acetaminophen  (TYLENOL ) 500 MG tablet Take 2 tablets (1,000 mg total) by mouth every 6 (six) hours as needed for moderate pain. 30 tablet 0   albuterol  (PROVENTIL ) (2.5 MG/3ML) 0.083% nebulizer solution Take 3 mLs (2.5 mg total) by nebulization every 6 (six) hours as needed for wheezing or shortness of breath. 75 mL 12   albuterol  (VENTOLIN  HFA)  108 (90 Base) MCG/ACT inhaler Inhale 1-2 puffs into the lungs every 6 (six) hours as needed for wheezing or shortness of breath. 1 each 3   ALPRAZolam  (XANAX ) 0.5 MG tablet Take 0.5 mg by mouth 2 (two) times daily as needed.     Ascorbic Acid (VITAMIN C) 100 MG tablet Take 100 mg by mouth daily.     BORIC ACID VAGINAL VA Place vaginally.     budesonide -formoterol  (SYMBICORT ) 160-4.5 MCG/ACT inhaler Inhale 2 puffs into the lungs  2 (two) times daily. 1 each 5   cholecalciferol (VITAMIN D3) 25 MCG (1000 UNIT) tablet Take 1,000 Units by mouth daily.     cyclobenzaprine  (FLEXERIL ) 10 MG tablet Take 1 tablet (10 mg total) by mouth at bedtime. 90 tablet 3   Dextroamphetamine Sulfate 20 MG TABS Take 40 mg by mouth as directed.     DULoxetine (CYMBALTA) 30 MG capsule Take 30 mg by mouth daily.     EPINEPHrine  0.3 mg/0.3 mL IJ SOAJ injection Inject 0.3 mg into the muscle as needed for anaphylaxis. 1 each 0   hydrOXYzine  (VISTARIL ) 25 MG capsule Take 1 capsule (25 mg total) by mouth every 8 (eight) hours as needed for itching. Medication can cause sleepiness/ drowsiness. 30 capsule 0   ibuprofen  (ADVIL ) 800 MG tablet Take 1 tablet (800 mg total) by mouth every 8 (eight) hours as needed. 90 tablet 3   lamoTRIgine  (LAMICTAL ) 200 MG tablet Take 100 mg by mouth 2 (two) times daily.     Lurasidone  HCl 60 MG TABS Take 60 mg by mouth every evening.     MAGNESIUM  MALATE PO Take 1 tablet by mouth daily at 6 (six) AM.     Multiple Vitamin (MULTIVITAMIN ADULT PO) Take 1 tablet by mouth daily.     ondansetron  (ZOFRAN -ODT) 4 MG disintegrating tablet Take 1 tablet (4 mg total) by mouth every 8 (eight) hours as needed for nausea or vomiting. 30 tablet 3   Probiotic Product (PROBIOTIC PO) Take by mouth.     Rimegepant Sulfate (NURTEC) 75 MG TBDP Take 1 tablet (75 mg total) by mouth every other day. 16 tablet 11   vitamin B-12 (CYANOCOBALAMIN ) 500 MCG tablet Take 500 mcg by mouth daily.     No current facility-administered medications for this visit.   Allergies: Allergies[1] Social History: Social History   Socioeconomic History   Marital status: Married    Spouse name: Deward   Number of children: 3   Years of education: Not on file   Highest education level: Associate degree: academic program  Occupational History   Occupation: unemployeed   Tobacco Use   Smoking status: Former    Current packs/day: 0.00    Types: Cigarettes     Start date: 12/20/2008    Quit date: 12/20/2009    Years since quitting: 14.8   Smokeless tobacco: Never   Tobacco comments:    age [age], quit, 1 cig daily  Vaping Use   Vaping status: Never Used  Substance and Sexual Activity   Alcohol use: Yes    Comment: Randomly   Drug use: Never   Sexual activity: Not Currently    Partners: Female, Female    Birth control/protection: Surgical    Comment: hysterectomy  Other Topics Concern   Not on file  Social History Narrative   Lives at home with husband, son, and 14 year old twin girls    Right handed   Currently [redacted] weeks pregnant with twins (as of 01/17/2020)  ---  update 03/02/2023 twins are now 31 years old    Caffeine: every once in awhile    Social Drivers of Health   Tobacco Use: Medium Risk (11/02/2024)   Patient History    Smoking Tobacco Use: Former    Smokeless Tobacco Use: Never    Passive Exposure: Not on file  Financial Resource Strain: High Risk (10/04/2023)   Overall Financial Resource Strain (CARDIA)    Difficulty of Paying Living Expenses: Hard  Food Insecurity: Food Insecurity Present (10/04/2023)   Hunger Vital Sign    Worried About Running Out of Food in the Last Year: Often true    Ran Out of Food in the Last Year: Sometimes true  Transportation Needs: Unmet Transportation Needs (10/04/2023)   PRAPARE - Administrator, Civil Service (Medical): Yes    Lack of Transportation (Non-Medical): No  Physical Activity: Inactive (10/04/2023)   Exercise Vital Sign    Days of Exercise per Week: 0 days    Minutes of Exercise per Session: 0 min  Stress: Stress Concern Present (10/04/2023)   [employer] of Occupational Health - Occupational Stress Questionnaire    Feeling of Stress : Very much  Social Connections: Moderately Isolated (10/04/2023)   Social Connection and Isolation Panel    Frequency of Communication with Friends and Family: More than three times a week    Frequency of Social Gatherings with Friends  and Family: Once a week    Attends Religious Services: Never    Database Administrator or Organizations: No    Attends Banker Meetings: Never    Marital Status: Married  Depression (PHQ2-9): Low Risk (01/10/2024)   Depression (PHQ2-9)    PHQ-2 Score: 0  Alcohol Screen: Low Risk (01/10/2024)   Alcohol Screen    Last Alcohol Screening Score (AUDIT): 0  Housing: High Risk (10/04/2023)   Housing Stability Vital Sign    Unable to Pay for Housing in the Last Year: Yes    Number of Times Moved in the Last Year: 0    Homeless in the Last Year: No  Utilities: Not At Risk (10/04/2023)   [facility] Utilities    Threatened with loss of utilities: No  Health Literacy: Inadequate Health Literacy (10/04/2023)   B1300 Health Literacy    Frequency of need for help with medical instructions: Sometimes   Lives in a ***. Smoking: *** Occupation: ***  Environmental HistorySurveyor, Minerals in the house: Network Engineer in the family room: {Blank single:19197::yes,no} Carpet in the bedroom: {Blank single:19197::yes,no} Heating: {Blank single:19197::electric,gas,heat pump} Cooling: {Blank single:19197::central,window,heat pump} Pet: {Blank single:19197::yes ***,no}  Family History: Family History  Problem Relation Age of Onset   Endometriosis Mother    Bipolar disorder Mother    Diabetes Mother        prediabetes   Schizophrenia Father    Depression Father    Breast cancer Paternal Grandmother    Cancer Paternal Grandmother    Depression Paternal Grandmother    Hypertension Other    Hyperlipidemia Other    Asthma Other    Heart disease Neg Hx    Colon cancer Neg Hx    Ovarian cancer Neg Hx    Problem                               Relation Asthma                                   ***  Eczema                                *** Food allergy                          *** Allergic rhino conjunctivitis     ***  Review of Systems   Constitutional:  Negative for appetite change, chills, fever and unexpected weight change.  HENT:  Negative for congestion and rhinorrhea.   Eyes:  Negative for itching.  Respiratory:  Negative for cough, chest tightness, shortness of breath and wheezing.   Cardiovascular:  Negative for chest pain.  Gastrointestinal:  Negative for abdominal pain.  Genitourinary:  Negative for difficulty urinating.  Skin:  Negative for rash.  Neurological:  Negative for headaches.    Objective: LMP 04/08/2021  There is no height or weight on file to calculate BMI. Physical Exam Vitals and nursing note reviewed.  Constitutional:      Appearance: Normal appearance. She is well-developed.  HENT:     Head: Normocephalic and atraumatic.     Right Ear: Tympanic membrane and external ear normal.     Left Ear: Tympanic membrane and external ear normal.     Nose: Nose normal.     Mouth/Throat:     Mouth: Mucous membranes are moist.     Pharynx: Oropharynx is clear.  Eyes:     Conjunctiva/sclera: Conjunctivae normal.  Cardiovascular:     Rate and Rhythm: Normal rate and regular rhythm.     Heart sounds: Normal heart sounds. No murmur heard.    No friction rub. No gallop.  Pulmonary:     Effort: Pulmonary effort is normal.     Breath sounds: Normal breath sounds. No wheezing, rhonchi or rales.  Musculoskeletal:     Cervical back: Neck supple.  Skin:    General: Skin is warm.     Findings: No rash.  Neurological:     Mental Status: She is alert and oriented to person, place, and time.  Psychiatric:        Behavior: Behavior normal.    The plan was reviewed with the patient/family, and all questions/concerned were addressed.  It was my pleasure to see Yariela today and participate in her care. Please feel free to contact me with any questions or concerns.  Sincerely,  Orlan Cramp, DO Allergy & Immunology  [facility]  [facility] office: [phone] [facility]  office: [phone]    [1]  Allergies Allergen Reactions   Amoxicillin  Shortness Of Breath and Nausea Only    Dizziness   Other     Must have anti-emetic prior to being given any narcotics    Latex Rash and Other (See Comments)   "

## 2024-11-07 ENCOUNTER — Encounter: Payer: Self-pay | Admitting: Allergy

## 2024-11-07 ENCOUNTER — Other Ambulatory Visit: Payer: Self-pay

## 2024-11-07 ENCOUNTER — Ambulatory Visit: Admitting: Allergy

## 2024-11-07 VITALS — BP 112/70 | HR 97 | Temp 98.2°F | Resp 18 | Ht 61.25 in | Wt 195.4 lb

## 2024-11-07 DIAGNOSIS — J4540 Moderate persistent asthma, uncomplicated: Secondary | ICD-10-CM

## 2024-11-07 DIAGNOSIS — J3089 Other allergic rhinitis: Secondary | ICD-10-CM

## 2024-11-07 DIAGNOSIS — L509 Urticaria, unspecified: Secondary | ICD-10-CM

## 2024-11-07 MED ORDER — MOMETASONE FURO-FORMOTEROL FUM 100-5 MCG/ACT IN AERO: 2.0000 | INHALATION_SPRAY | Freq: Two times a day (BID) | RESPIRATORY_TRACT | 3 refills | Status: AC

## 2024-11-07 MED ORDER — ALBUTEROL SULFATE HFA 108 (90 BASE) MCG/ACT IN AERS: 2.0000 | INHALATION_SPRAY | RESPIRATORY_TRACT | 1 refills | Status: AC | PRN

## 2024-11-07 NOTE — Patient Instructions (Addendum)
 Environmental allergies/rash Keep track of rashes and take pictures. See below for proper skin care.  Use over the counter antihistamines such as Zyrtec (cetirizine), Claritin (loratadine), Allegra (fexofenadine), or Xyzal  (levocetirizine) daily as needed. May take twice a day during allergy flares. May switch antihistamines every few months. See below for environmental control measures.  Get bloodwork If significantly positive will recommend allergy injections -handout given. If negative, will need to do skin testing next.  We are ordering labs, so please allow 1-2 weeks for the results to come back. With the newly implemented Cures Act, the labs might be visible to you at the same time that they become visible to me. However, I will not address the results until all of the results are back, so please be patient.  In the meantime, continue recommendations in your patient instructions, including avoidance measures (if applicable), until you hear from me.  Asthma Daily controller medication(s): start Dulera  100mcg 2 puffs twice a day with spacer and rinse mouth afterwards.  If not covered: Check the pricing for the following inhalers: Advair HFA Advair Diskus Wixela Breo Symbicort  Breyna   May use albuterol  rescue inhaler 2 puffs or nebulizer every 4 to 6 hours as needed for shortness of breath, chest tightness, coughing, and wheezing. May use albuterol  rescue inhaler 2 puffs 5 to 15 minutes prior to strenuous physical activities. Monitor frequency of use - if you need to use it more than twice per week on a consistent basis let us  know.  Breathing control goals:  Full participation in all desired activities (may need albuterol  before activity) Albuterol  use two times or less a week on average (not counting use with activity) Cough interfering with sleep two times or less a month Oral steroids no more than once a year No hospitalizations   Follow up in 1 month or sooner if  needed.  Pet Allergen Avoidance: Contrary to popular opinion, there are no hypoallergenic breeds of dogs or cats. That is because people are not allergic to an animals hair, but to an allergen found in the animal's saliva, dander (dead skin flakes) or urine. Pet allergy symptoms typically occur within minutes. For some people, symptoms can build up and become most severe 8 to 12 hours after contact with the animal. People with severe allergies can experience reactions in public places if dander has been transported on the pet owners clothing. Keeping an animal outdoors is only a partial solution, since homes with pets in the yard still have higher concentrations of animal allergens. Before getting a pet, ask your allergist to determine if you are allergic to animals. If your pet is already considered part of your family, try to minimize contact and keep the pet out of the bedroom and other rooms where you spend a great deal of time. As with dust mites, vacuum carpets often or replace carpet with a hardwood floor, tile or linoleum. High-efficiency particulate air (HEPA) cleaners can reduce allergen levels over time. While dander and saliva are the source of cat and dog allergens, urine is the source of allergens from rabbits, hamsters, mice and guinea pigs; so ask a non-allergic family member to clean the animals cage. If you have a pet allergy, talk to your allergist about the potential for allergy immunotherapy (allergy shots). This strategy can often provide long-term relief.   Skin care recommendations  Bath time: Always use lukewarm water. AVOID very hot or cold water. Keep bathing time to 5-10 minutes. Do NOT use bubble bath. Use a  mild soap and use just enough to wash the dirty areas. Do NOT scrub skin vigorously.  After bathing, pat dry your skin with a towel. Do NOT rub or scrub the skin.  Moisturizers and prescriptions:  ALWAYS apply moisturizers immediately after bathing (within  3 minutes). This helps to lock-in moisture. Use the moisturizer several times a day over the whole body. Good summer moisturizers include: Aveeno, CeraVe, Cetaphil. Good winter moisturizers include: Aquaphor, Vaseline, Cerave, Cetaphil, Eucerin, Vanicream. When using moisturizers along with medications, the moisturizer should be applied about one hour after applying the medication to prevent diluting effect of the medication or moisturize around where you applied the medications. When not using medications, the moisturizer can be continued twice daily as maintenance.  Laundry and clothing: Avoid laundry products with added color or perfumes. Use unscented hypo-allergenic laundry products such as Tide free, Cheer free & gentle, and All free and clear.  If the skin still seems dry or sensitive, you can try double-rinsing the clothes. Avoid tight or scratchy clothing such as wool. Do not use fabric softeners or dyer sheets.

## 2024-11-10 ENCOUNTER — Other Ambulatory Visit: Payer: Self-pay | Admitting: Nurse Practitioner

## 2024-11-10 MED ORDER — DULOXETINE HCL 30 MG PO CPEP: 30.0000 mg | ORAL_CAPSULE | Freq: Every day | ORAL | 0 refills | Status: AC

## 2024-11-10 NOTE — Telephone Encounter (Signed)
 Copied from CRM #[id number]. Topic: Clinical - Medication Question >> Nov 10, 2024 10:22 AM Delon DASEN wrote: Reason for CRM: unable to reach psychiatrist, asking if [facility] can order an emergency refill of DULoxetine (CYMBALTA) 30 MG capsule until they are able to get normal refill- [phone]- need done today if possible

## 2024-12-05 ENCOUNTER — Ambulatory Visit: Admitting: Allergy

## 2025-01-03 ENCOUNTER — Telehealth: Admitting: Neurology

## 2025-01-31 ENCOUNTER — Ambulatory Visit: Admitting: Nurse Practitioner
